# Patient Record
Sex: Male | Born: 1945 | Race: White | Hispanic: No | Marital: Married | Smoking: Never smoker
Health system: Southern US, Community
[De-identification: ages and names within clinical notes are randomized; demographics above are authoritative.]

## PROBLEM LIST (undated history)

## (undated) DIAGNOSIS — E119 Type 2 diabetes mellitus without complications: Secondary | ICD-10-CM

## (undated) DIAGNOSIS — K5792 Diverticulitis of intestine, part unspecified, without perforation or abscess without bleeding: Secondary | ICD-10-CM

## (undated) DIAGNOSIS — E785 Hyperlipidemia, unspecified: Secondary | ICD-10-CM

## (undated) DIAGNOSIS — D689 Coagulation defect, unspecified: Secondary | ICD-10-CM

## (undated) DIAGNOSIS — H269 Unspecified cataract: Secondary | ICD-10-CM

## (undated) DIAGNOSIS — N401 Enlarged prostate with lower urinary tract symptoms: Secondary | ICD-10-CM

## (undated) DIAGNOSIS — R351 Nocturia: Secondary | ICD-10-CM

## (undated) DIAGNOSIS — R001 Bradycardia, unspecified: Secondary | ICD-10-CM

## (undated) DIAGNOSIS — K810 Acute cholecystitis: Secondary | ICD-10-CM

## (undated) DIAGNOSIS — I219 Acute myocardial infarction, unspecified: Secondary | ICD-10-CM

## (undated) DIAGNOSIS — N486 Induration penis plastica: Secondary | ICD-10-CM

## (undated) DIAGNOSIS — I10 Essential (primary) hypertension: Secondary | ICD-10-CM

## (undated) DIAGNOSIS — I6529 Occlusion and stenosis of unspecified carotid artery: Secondary | ICD-10-CM

## (undated) DIAGNOSIS — K219 Gastro-esophageal reflux disease without esophagitis: Secondary | ICD-10-CM

## (undated) DIAGNOSIS — E559 Vitamin D deficiency, unspecified: Secondary | ICD-10-CM

## (undated) DIAGNOSIS — Z5189 Encounter for other specified aftercare: Secondary | ICD-10-CM

## (undated) DIAGNOSIS — D649 Anemia, unspecified: Secondary | ICD-10-CM

## (undated) DIAGNOSIS — S46009A Unspecified injury of muscle(s) and tendon(s) of the rotator cuff of unspecified shoulder, initial encounter: Secondary | ICD-10-CM

## (undated) DIAGNOSIS — B009 Herpesviral infection, unspecified: Secondary | ICD-10-CM

## (undated) DIAGNOSIS — D126 Benign neoplasm of colon, unspecified: Secondary | ICD-10-CM

## (undated) DIAGNOSIS — D696 Thrombocytopenia, unspecified: Secondary | ICD-10-CM

## (undated) DIAGNOSIS — Z86018 Personal history of other benign neoplasm: Secondary | ICD-10-CM

## (undated) DIAGNOSIS — M1711 Unilateral primary osteoarthritis, right knee: Secondary | ICD-10-CM

## (undated) DIAGNOSIS — G2581 Restless legs syndrome: Secondary | ICD-10-CM

## (undated) DIAGNOSIS — C439 Malignant melanoma of skin, unspecified: Secondary | ICD-10-CM

## (undated) DIAGNOSIS — R739 Hyperglycemia, unspecified: Secondary | ICD-10-CM

## (undated) DIAGNOSIS — I2510 Atherosclerotic heart disease of native coronary artery without angina pectoris: Secondary | ICD-10-CM

## (undated) DIAGNOSIS — M7542 Impingement syndrome of left shoulder: Secondary | ICD-10-CM

## (undated) HISTORY — PX: COLONOSCOPY W/ POLYPECTOMY: SHX1380

## (undated) HISTORY — DX: Thrombocytopenia, unspecified: D69.6

## (undated) HISTORY — DX: Essential (primary) hypertension: I10

## (undated) HISTORY — DX: Occlusion and stenosis of unspecified carotid artery: I65.29

## (undated) HISTORY — PX: CHOLECYSTECTOMY: SHX55

## (undated) HISTORY — DX: Acute myocardial infarction, unspecified: I21.9

## (undated) HISTORY — DX: Herpesviral infection, unspecified: B00.9

## (undated) HISTORY — DX: Coagulation defect, unspecified: D68.9

## (undated) HISTORY — DX: Type 2 diabetes mellitus without complications: E11.9

## (undated) HISTORY — DX: Anemia, unspecified: D64.9

## (undated) HISTORY — DX: Atherosclerotic heart disease of native coronary artery without angina pectoris: I25.10

## (undated) HISTORY — DX: Benign neoplasm of colon, unspecified: D12.6

## (undated) HISTORY — DX: Nocturia: R35.1

## (undated) HISTORY — DX: Restless legs syndrome: G25.81

## (undated) HISTORY — DX: Unspecified cataract: H26.9

## (undated) HISTORY — DX: Vitamin D deficiency, unspecified: E55.9

## (undated) HISTORY — DX: Hyperglycemia, unspecified: R73.9

## (undated) HISTORY — DX: Unilateral primary osteoarthritis, right knee: M17.11

## (undated) HISTORY — DX: Benign prostatic hyperplasia with lower urinary tract symptoms: N40.1

## (undated) HISTORY — DX: Gastro-esophageal reflux disease without esophagitis: K21.9

## (undated) HISTORY — PX: VASECTOMY: SHX75

## (undated) HISTORY — DX: Induration penis plastica: N48.6

## (undated) HISTORY — DX: Impingement syndrome of left shoulder: M75.42

## (undated) HISTORY — DX: Encounter for other specified aftercare: Z51.89

## (undated) HISTORY — DX: Malignant melanoma of skin, unspecified: C43.9

## (undated) HISTORY — DX: Unspecified injury of muscle(s) and tendon(s) of the rotator cuff of unspecified shoulder, initial encounter: S46.009A

## (undated) HISTORY — DX: Bradycardia, unspecified: R00.1

## (undated) HISTORY — DX: Hyperlipidemia, unspecified: E78.5

## (undated) HISTORY — PX: CORONARY ANGIOPLASTY: SHX604

## (undated) HISTORY — DX: Acute cholecystitis: K81.0

---
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
## 1999-04-18 HISTORY — PX: CORONARY ARTERY BYPASS GRAFT: SHX141

## 1999-05-08 ENCOUNTER — Inpatient Hospital Stay (HOSPITAL_COMMUNITY): Admission: AD | Admit: 1999-05-08 | Discharge: 1999-05-17 | Payer: Self-pay | Admitting: Cardiology

## 1999-05-10 ENCOUNTER — Encounter: Payer: Self-pay | Admitting: Cardiology

## 1999-05-11 ENCOUNTER — Encounter: Payer: Self-pay | Admitting: Cardiology

## 1999-05-13 ENCOUNTER — Encounter: Payer: Self-pay | Admitting: Cardiothoracic Surgery

## 1999-05-14 ENCOUNTER — Encounter: Payer: Self-pay | Admitting: Cardiothoracic Surgery

## 1999-05-15 ENCOUNTER — Encounter: Payer: Self-pay | Admitting: Cardiothoracic Surgery

## 2001-04-29 ENCOUNTER — Inpatient Hospital Stay (HOSPITAL_COMMUNITY): Admission: EM | Admit: 2001-04-29 | Discharge: 2001-04-30 | Payer: Self-pay | Admitting: Emergency Medicine

## 2002-03-19 ENCOUNTER — Encounter: Payer: Self-pay | Admitting: Emergency Medicine

## 2002-03-19 ENCOUNTER — Emergency Department (HOSPITAL_COMMUNITY): Admission: EM | Admit: 2002-03-19 | Discharge: 2002-03-19 | Payer: Self-pay | Admitting: Emergency Medicine

## 2004-04-08 ENCOUNTER — Ambulatory Visit: Payer: Self-pay

## 2004-04-08 ENCOUNTER — Ambulatory Visit: Payer: Self-pay | Admitting: Cardiology

## 2004-09-22 ENCOUNTER — Ambulatory Visit: Payer: Self-pay | Admitting: Internal Medicine

## 2004-09-24 ENCOUNTER — Ambulatory Visit: Payer: Self-pay | Admitting: Internal Medicine

## 2004-12-05 ENCOUNTER — Ambulatory Visit: Payer: Self-pay | Admitting: Gastroenterology

## 2004-12-16 ENCOUNTER — Encounter (INDEPENDENT_AMBULATORY_CARE_PROVIDER_SITE_OTHER): Payer: Self-pay | Admitting: Specialist

## 2004-12-16 ENCOUNTER — Ambulatory Visit: Payer: Self-pay | Admitting: Gastroenterology

## 2004-12-16 DIAGNOSIS — D126 Benign neoplasm of colon, unspecified: Secondary | ICD-10-CM | POA: Insufficient documentation

## 2004-12-16 HISTORY — DX: Benign neoplasm of colon, unspecified: D12.6

## 2005-06-22 ENCOUNTER — Ambulatory Visit: Payer: Self-pay | Admitting: Cardiology

## 2005-06-26 ENCOUNTER — Ambulatory Visit: Payer: Self-pay | Admitting: Cardiology

## 2005-09-08 ENCOUNTER — Ambulatory Visit: Payer: Self-pay | Admitting: Cardiology

## 2005-10-16 ENCOUNTER — Ambulatory Visit: Payer: Self-pay | Admitting: Cardiology

## 2005-11-24 ENCOUNTER — Ambulatory Visit: Payer: Self-pay | Admitting: Cardiology

## 2006-01-05 ENCOUNTER — Ambulatory Visit: Payer: Self-pay | Admitting: Internal Medicine

## 2006-03-24 ENCOUNTER — Ambulatory Visit: Payer: Self-pay | Admitting: Cardiology

## 2006-06-11 ENCOUNTER — Ambulatory Visit: Payer: Self-pay | Admitting: Internal Medicine

## 2006-07-05 ENCOUNTER — Ambulatory Visit: Payer: Self-pay | Admitting: Cardiology

## 2006-07-07 ENCOUNTER — Ambulatory Visit: Payer: Self-pay | Admitting: Cardiology

## 2006-07-07 LAB — CONVERTED CEMR LAB
ALT: 44 units/L — ABNORMAL HIGH (ref 0–40)
AST: 35 units/L (ref 0–37)
Albumin: 3.9 g/dL (ref 3.5–5.2)
Alkaline Phosphatase: 62 units/L (ref 39–117)
Bilirubin, Direct: 0.1 mg/dL (ref 0.0–0.3)
Cholesterol: 176 mg/dL (ref 0–200)
Direct LDL: 109.4 mg/dL
HDL: 42.3 mg/dL (ref >39.0)
Total Bilirubin: 0.8 mg/dL (ref 0.3–1.2)
Total CHOL/HDL Ratio: 4.2
Total Protein: 6.8 g/dL (ref 6.0–8.3)
Triglycerides: 308 mg/dL (ref 0–149)
VLDL: 62 mg/dL — ABNORMAL HIGH (ref 0–40)

## 2006-09-11 ENCOUNTER — Observation Stay (HOSPITAL_COMMUNITY): Admission: EM | Admit: 2006-09-11 | Discharge: 2006-09-13 | Payer: Self-pay | Admitting: Emergency Medicine

## 2006-09-11 ENCOUNTER — Ambulatory Visit: Payer: Self-pay | Admitting: Cardiology

## 2006-09-17 ENCOUNTER — Ambulatory Visit: Payer: Self-pay

## 2006-09-24 ENCOUNTER — Ambulatory Visit: Payer: Self-pay | Admitting: Cardiology

## 2006-09-30 DIAGNOSIS — Z860100 Personal history of colon polyps, unspecified: Secondary | ICD-10-CM

## 2006-09-30 DIAGNOSIS — E291 Testicular hypofunction: Secondary | ICD-10-CM | POA: Insufficient documentation

## 2006-09-30 DIAGNOSIS — I25810 Atherosclerosis of coronary artery bypass graft(s) without angina pectoris: Secondary | ICD-10-CM

## 2006-09-30 DIAGNOSIS — I10 Essential (primary) hypertension: Secondary | ICD-10-CM

## 2006-09-30 HISTORY — DX: Atherosclerosis of coronary artery bypass graft(s) without angina pectoris: I25.810

## 2006-09-30 HISTORY — DX: Personal history of colonic polyps: Z86.010

## 2006-09-30 HISTORY — DX: Personal history of colon polyps, unspecified: Z86.0100

## 2006-09-30 HISTORY — DX: Essential (primary) hypertension: I10

## 2007-02-22 ENCOUNTER — Ambulatory Visit: Payer: Self-pay | Admitting: Internal Medicine

## 2007-02-22 DIAGNOSIS — K219 Gastro-esophageal reflux disease without esophagitis: Secondary | ICD-10-CM | POA: Insufficient documentation

## 2007-02-22 HISTORY — DX: Gastro-esophageal reflux disease without esophagitis: K21.9

## 2007-03-25 ENCOUNTER — Telehealth (INDEPENDENT_AMBULATORY_CARE_PROVIDER_SITE_OTHER): Payer: Self-pay | Admitting: *Deleted

## 2007-03-31 ENCOUNTER — Ambulatory Visit: Payer: Self-pay | Admitting: Cardiology

## 2007-04-06 ENCOUNTER — Encounter: Payer: Self-pay | Admitting: Internal Medicine

## 2007-06-06 ENCOUNTER — Telehealth (INDEPENDENT_AMBULATORY_CARE_PROVIDER_SITE_OTHER): Payer: Self-pay | Admitting: *Deleted

## 2007-06-10 ENCOUNTER — Ambulatory Visit: Payer: Self-pay | Admitting: Internal Medicine

## 2007-06-10 ENCOUNTER — Encounter (INDEPENDENT_AMBULATORY_CARE_PROVIDER_SITE_OTHER): Payer: Self-pay | Admitting: *Deleted

## 2007-12-23 ENCOUNTER — Ambulatory Visit: Payer: Self-pay

## 2007-12-23 ENCOUNTER — Ambulatory Visit: Payer: Self-pay | Admitting: Cardiology

## 2007-12-23 LAB — CONVERTED CEMR LAB
ALT: 26 units/L (ref 0–53)
AST: 29 units/L (ref 0–37)
Albumin: 4.4 g/dL (ref 3.5–5.2)
Alkaline Phosphatase: 50 units/L (ref 39–117)
Bilirubin, Direct: 0.1 mg/dL (ref 0.0–0.3)
Cholesterol: 174 mg/dL (ref 0–200)
HDL: 45.3 mg/dL (ref >39.0)
LDL Cholesterol: 93 mg/dL (ref 0–99)
PSA: 1.07 ng/mL (ref 0.10–4.00)
Total Bilirubin: 1.5 mg/dL — ABNORMAL HIGH (ref 0.3–1.2)
Total CHOL/HDL Ratio: 3.8
Total Protein: 7.5 g/dL (ref 6.0–8.3)
Triglycerides: 179 mg/dL — ABNORMAL HIGH (ref 0–149)
VLDL: 36 mg/dL (ref 0–40)

## 2008-01-30 ENCOUNTER — Telehealth (INDEPENDENT_AMBULATORY_CARE_PROVIDER_SITE_OTHER): Payer: Self-pay | Admitting: *Deleted

## 2008-02-06 ENCOUNTER — Telehealth (INDEPENDENT_AMBULATORY_CARE_PROVIDER_SITE_OTHER): Payer: Self-pay | Admitting: *Deleted

## 2008-02-06 ENCOUNTER — Ambulatory Visit: Payer: Self-pay | Admitting: Internal Medicine

## 2008-05-28 ENCOUNTER — Telehealth (INDEPENDENT_AMBULATORY_CARE_PROVIDER_SITE_OTHER): Payer: Self-pay | Admitting: *Deleted

## 2008-06-15 ENCOUNTER — Telehealth (INDEPENDENT_AMBULATORY_CARE_PROVIDER_SITE_OTHER): Payer: Self-pay | Admitting: *Deleted

## 2008-08-07 ENCOUNTER — Ambulatory Visit: Payer: Self-pay | Admitting: Internal Medicine

## 2008-08-07 DIAGNOSIS — R7303 Prediabetes: Secondary | ICD-10-CM | POA: Insufficient documentation

## 2008-08-07 DIAGNOSIS — E785 Hyperlipidemia, unspecified: Secondary | ICD-10-CM

## 2008-08-14 ENCOUNTER — Telehealth (INDEPENDENT_AMBULATORY_CARE_PROVIDER_SITE_OTHER): Payer: Self-pay | Admitting: *Deleted

## 2008-09-24 ENCOUNTER — Telehealth (INDEPENDENT_AMBULATORY_CARE_PROVIDER_SITE_OTHER): Payer: Self-pay | Admitting: *Deleted

## 2008-12-05 ENCOUNTER — Encounter (INDEPENDENT_AMBULATORY_CARE_PROVIDER_SITE_OTHER): Payer: Self-pay | Admitting: *Deleted

## 2008-12-10 ENCOUNTER — Ambulatory Visit: Payer: Self-pay | Admitting: Internal Medicine

## 2008-12-10 LAB — CONVERTED CEMR LAB: Hgb A1c MFr Bld: 6.2 % (ref 4.6–6.5)

## 2008-12-11 ENCOUNTER — Encounter (INDEPENDENT_AMBULATORY_CARE_PROVIDER_SITE_OTHER): Payer: Self-pay | Admitting: *Deleted

## 2008-12-16 LAB — CONVERTED CEMR LAB: Vit D, 25-Hydroxy: 25 ng/mL — ABNORMAL LOW (ref 30–89)

## 2008-12-18 ENCOUNTER — Encounter (INDEPENDENT_AMBULATORY_CARE_PROVIDER_SITE_OTHER): Payer: Self-pay | Admitting: *Deleted

## 2009-01-22 ENCOUNTER — Telehealth (INDEPENDENT_AMBULATORY_CARE_PROVIDER_SITE_OTHER): Payer: Self-pay | Admitting: *Deleted

## 2009-01-23 ENCOUNTER — Telehealth: Payer: Self-pay | Admitting: Cardiology

## 2009-02-03 ENCOUNTER — Emergency Department (HOSPITAL_COMMUNITY): Admission: EM | Admit: 2009-02-03 | Discharge: 2009-02-03 | Payer: Self-pay | Admitting: Emergency Medicine

## 2009-03-18 ENCOUNTER — Telehealth (INDEPENDENT_AMBULATORY_CARE_PROVIDER_SITE_OTHER): Payer: Self-pay | Admitting: *Deleted

## 2009-07-26 ENCOUNTER — Ambulatory Visit: Payer: Self-pay | Admitting: Cardiology

## 2009-08-26 ENCOUNTER — Telehealth (INDEPENDENT_AMBULATORY_CARE_PROVIDER_SITE_OTHER): Payer: Self-pay | Admitting: *Deleted

## 2009-09-23 ENCOUNTER — Telehealth (INDEPENDENT_AMBULATORY_CARE_PROVIDER_SITE_OTHER): Payer: Self-pay | Admitting: *Deleted

## 2009-10-21 ENCOUNTER — Telehealth (INDEPENDENT_AMBULATORY_CARE_PROVIDER_SITE_OTHER): Payer: Self-pay | Admitting: *Deleted

## 2009-11-08 ENCOUNTER — Ambulatory Visit: Payer: Self-pay | Admitting: Internal Medicine

## 2009-11-08 ENCOUNTER — Encounter (INDEPENDENT_AMBULATORY_CARE_PROVIDER_SITE_OTHER): Payer: Self-pay | Admitting: *Deleted

## 2009-11-08 DIAGNOSIS — E559 Vitamin D deficiency, unspecified: Secondary | ICD-10-CM | POA: Insufficient documentation

## 2009-11-11 LAB — CONVERTED CEMR LAB
ALT: 30 units/L (ref 0–53)
AST: 38 units/L — ABNORMAL HIGH (ref 0–37)
Albumin: 4.5 g/dL (ref 3.5–5.2)
Alkaline Phosphatase: 49 units/L (ref 39–117)
BUN: 11 mg/dL (ref 6–23)
Basophils Absolute: 0 10*3/uL (ref 0.0–0.1)
Basophils Relative: 0.3 % (ref 0.0–3.0)
Bilirubin, Direct: 0.2 mg/dL (ref 0.0–0.3)
CO2: 29 meq/L (ref 19–32)
Calcium: 9.4 mg/dL (ref 8.4–10.5)
Chloride: 108 meq/L (ref 96–112)
Cholesterol: 172 mg/dL (ref 0–200)
Creatinine, Ser: 1 mg/dL (ref 0.4–1.5)
Direct LDL: 104.6 mg/dL
Eosinophils Absolute: 0.1 10*3/uL (ref 0.0–0.7)
Eosinophils Relative: 1.3 % (ref 0.0–5.0)
GFR calc non Af Amer: 81.44 mL/min (ref >60)
Glucose, Bld: 118 mg/dL — ABNORMAL HIGH (ref 70–99)
HCT: 43.3 % (ref 39.0–52.0)
HDL: 47.2 mg/dL (ref >39.00)
Hemoglobin: 15.1 g/dL (ref 13.0–17.0)
Hgb A1c MFr Bld: 6.3 % (ref 4.6–6.5)
Lymphocytes Relative: 24.3 % (ref 12.0–46.0)
Lymphs Abs: 1.5 10*3/uL (ref 0.7–4.0)
MCHC: 34.8 g/dL (ref 30.0–36.0)
MCV: 92.1 fL (ref 78.0–100.0)
Monocytes Absolute: 0.6 10*3/uL (ref 0.1–1.0)
Monocytes Relative: 9.4 % (ref 3.0–12.0)
Neutro Abs: 3.9 10*3/uL (ref 1.4–7.7)
Neutrophils Relative %: 64.7 % (ref 43.0–77.0)
Platelets: 162 10*3/uL (ref 150.0–400.0)
Potassium: 5.2 meq/L — ABNORMAL HIGH (ref 3.5–5.1)
RBC: 4.71 M/uL (ref 4.22–5.81)
RDW: 13.5 % (ref 11.5–14.6)
Sodium: 144 meq/L (ref 135–145)
TSH: 1.37 microintl units/mL (ref 0.35–5.50)
Testosterone: 224.98 ng/dL — ABNORMAL LOW (ref 350.00–890.00)
Total Bilirubin: 1.1 mg/dL (ref 0.3–1.2)
Total CHOL/HDL Ratio: 4
Total Protein: 7.2 g/dL (ref 6.0–8.3)
Triglycerides: 269 mg/dL — ABNORMAL HIGH (ref 0.0–149.0)
VLDL: 53.8 mg/dL — ABNORMAL HIGH (ref 0.0–40.0)
Vit D, 25-Hydroxy: 46 ng/mL (ref 30–89)
WBC: 6.1 10*3/uL (ref 4.5–10.5)

## 2009-12-10 ENCOUNTER — Encounter (INDEPENDENT_AMBULATORY_CARE_PROVIDER_SITE_OTHER): Payer: Self-pay | Admitting: *Deleted

## 2009-12-12 ENCOUNTER — Ambulatory Visit: Payer: Self-pay | Admitting: Gastroenterology

## 2009-12-24 ENCOUNTER — Telehealth: Payer: Self-pay | Admitting: Gastroenterology

## 2009-12-25 ENCOUNTER — Telehealth: Payer: Self-pay | Admitting: Gastroenterology

## 2009-12-26 ENCOUNTER — Ambulatory Visit: Payer: Self-pay | Admitting: Gastroenterology

## 2010-01-01 ENCOUNTER — Encounter: Payer: Self-pay | Admitting: Gastroenterology

## 2010-01-27 ENCOUNTER — Telehealth (INDEPENDENT_AMBULATORY_CARE_PROVIDER_SITE_OTHER): Payer: Self-pay | Admitting: *Deleted

## 2010-04-09 ENCOUNTER — Ambulatory Visit: Payer: Self-pay | Admitting: Internal Medicine

## 2010-04-09 DIAGNOSIS — K5732 Diverticulitis of large intestine without perforation or abscess without bleeding: Secondary | ICD-10-CM | POA: Insufficient documentation

## 2010-05-18 HISTORY — PX: CORONARY STENT PLACEMENT: SHX1402

## 2010-06-15 LAB — CONVERTED CEMR LAB
ALT: 28 units/L (ref 0–53)
AST: 32 units/L (ref 0–37)
Albumin: 4.1 g/dL (ref 3.5–5.2)
Alkaline Phosphatase: 53 units/L (ref 39–117)
BUN: 13 mg/dL (ref 6–23)
Basophils Absolute: 0 10*3/uL (ref 0.0–0.1)
Basophils Relative: 0.1 % (ref 0.0–3.0)
Bilirubin, Direct: 0 mg/dL (ref 0.0–0.3)
CO2: 29 meq/L (ref 19–32)
Calcium: 9.3 mg/dL (ref 8.4–10.5)
Chloride: 104 meq/L (ref 96–112)
Cholesterol: 187 mg/dL (ref 0–200)
Creatinine, Ser: 1 mg/dL (ref 0.4–1.5)
Eosinophils Absolute: 0 10*3/uL (ref 0.0–0.7)
Eosinophils Relative: 0.8 % (ref 0.0–5.0)
GFR calc non Af Amer: 80.83 mL/min (ref >60)
Glucose, Bld: 107 mg/dL — ABNORMAL HIGH (ref 70–99)
HCT: 44.3 % (ref 39.0–52.0)
HDL: 55.2 mg/dL (ref >39.00)
Hemoglobin: 15.6 g/dL (ref 13.0–17.0)
Hgb A1c MFr Bld: 6.2 % (ref 4.6–6.5)
LDL Cholesterol: 101 mg/dL — ABNORMAL HIGH (ref 0–99)
Lymphocytes Relative: 24.7 % (ref 12.0–46.0)
Lymphs Abs: 1.5 10*3/uL (ref 0.7–4.0)
MCHC: 35.2 g/dL (ref 30.0–36.0)
MCV: 94.1 fL (ref 78.0–100.0)
Monocytes Absolute: 0.6 10*3/uL (ref 0.1–1.0)
Monocytes Relative: 9.2 % (ref 3.0–12.0)
Neutro Abs: 4.1 10*3/uL (ref 1.4–7.7)
Neutrophils Relative %: 65.2 % (ref 43.0–77.0)
Platelets: 141 10*3/uL — ABNORMAL LOW (ref 150.0–400.0)
Potassium: 4.7 meq/L (ref 3.5–5.1)
RBC: 4.71 M/uL (ref 4.22–5.81)
RDW: 12.2 % (ref 11.5–14.6)
Sodium: 141 meq/L (ref 135–145)
TSH: 0.97 microintl units/mL (ref 0.35–5.50)
Total Bilirubin: 1.1 mg/dL (ref 0.3–1.2)
Total CHOL/HDL Ratio: 3
Total Protein: 7.2 g/dL (ref 6.0–8.3)
Triglycerides: 153 mg/dL — ABNORMAL HIGH (ref 0.0–149.0)
VLDL: 30.6 mg/dL (ref 0.0–40.0)
Vit D, 25-Hydroxy: 12 ng/mL — ABNORMAL LOW (ref 30–89)
WBC: 6.2 10*3/uL (ref 4.5–10.5)

## 2010-06-19 NOTE — Assessment & Plan Note (Signed)
Summary: ROV  Medications Added MULTIVITAMINS   TABS (MULTIPLE VITAMIN) 1 by mouth daily ASPIRIN 81 MG  TABS (ASPIRIN) 1 by mouth daily      Allergies Added:   Visit Type:  Follow-up Primary Provider:  Marga Melnick MD  CC:  CAD.  History of Present Illness: The patient presents for her yearly followup although it's been greater than a year. He has known coronary artery disease. Since his last catheterization and stress test he has had no new symptoms. He had one episode about 6 months ago where his blood pressure shot up but this was transient. This has otherwise not been a problem. He has had no further chest pressure, neck or arm discomfort. He has had no palpitations, presyncope or syncope. He denies any PND or orthopnea. He hadn't been exercising until recently and he's now started walking. He's had no problems with this.  Current Medications (verified): 1)  Zetia 10 Mg Tabs (Ezetimibe) .... Take 1 Tablet By Mouth Once A Day 2)  Altace 10 Mg Caps (Ramipril) .... Take 1 Capsule By Mouth Once A Day 3)  Crestor 20 Mg Tabs (Rosuvastatin Calcium) .... Take 1 Tablet By Mouth Once A Day 4)  Aciphex 20 Mg  Tbec (Rabeprazole Sodium) .Marland Kitchen.. 1 By Mouth Qd 5)  Niaspan 1000 Mg  Tbcr (Niacin (Antihyperlipidemic)) .Marland Kitchen.. 1 By Mouth Qd 6)  Metoprolol Tartrate 50 Mg  Tabs (Metoprolol Tartrate) .... 1/2 Tab Bid 7)  Viagra 100 Mg Tabs (Sildenafil Citrate) .... Once Daily As Needed ;do Not Take With Ntg 8)  Nitrostat 0.4 Mg Subl (Nitroglycerin) .Marland Kitchen.. 1 By Mouth As Needed For Chest Pain 9)  Multivitamins   Tabs (Multiple Vitamin) .Marland Kitchen.. 1 By Mouth Daily 10)  Aspirin 81 Mg  Tabs (Aspirin) .Marland Kitchen.. 1 By Mouth Daily  Allergies (verified): 1)  * Tylox  Past History:  Past Medical History: Reviewed history from 07/24/2009 and no changes required. Coronary artery disease(Left anterior descending artery occluded proximally,       diagonal ostial 30-40% stenosis, circumflex and the AV groove 60%       stenosis,  before mid obtuse marginal, large ramus intermediate       occluded at the ostium. The right coronary artery is occluded       proximally. Left internal mammary artery to the left anterior       descending artery was widely patent, saphenous vein graft       sequential to the acute marginal distal right coronary artery had       mild diffuse luminal irregularities. Saphenous vein graft to the       ramus intermediate had 25% stenosis with diffuse luminal       irregularities. Ejection fraction of 55%. The patient had a       negative stress perfusion study to evaluate the circumflex lesion.       The ejection fraction was 66% with no evidence of ischemia or       infarct.)  Hypertension GERD Hyperlipidemia Colonic polyps, hx of  Past Surgical History: Reviewed history from 08/07/2008 and no changes required. Colon polypectomy 2006  tubular adenomas, Dr Russella Dar (due 2011) [facility]o : [facility], Dr Russella Dar Coronary artery bypass graft, 4 vessel 2001 Vasectomy  Review of Systems       As stated in the HPI and negative for all other systems.   Vital Signs:  Patient profile:   65 year old male Height:      68 inches Weight:  209 pounds BMI:     31.89 Pulse rate:   62 / minute Resp:     16 per minute BP sitting:   130 / 86  (right arm)  Vitals Entered By: Marrion Coy, CNA (July 26, 2009 9:21 AM)  Physical Exam  General:  Well developed, well nourished, in no acute distress. Head:  normocephalic and atraumatic Eyes:  PERRLA/EOM intact; conjunctiva and lids normal. Mouth:  Teeth, gums and palate normal. Oral mucosa normal. Neck:  Neck supple, no JVD. No masses, thyromegaly or abnormal cervical nodes. Chest Wall:  well healed surgical scar Lungs:  Clear bilaterally to auscultation and percussion. Heart:  Non-displaced PMI, chest non-tender; regular rate and rhythm, S1, S2 without murmurs, rubs or gallops. Carotid upstroke normal, no bruit. Normal abdominal aortic size, no bruits.  Femorals normal pulses, no bruits. Pedals normal pulses. No edema, no varicosities. Abdomen:  Bowel sounds positive; abdomen soft and non-tender without masses, organomegaly, or hernias noted. No hepatosplenomegaly, obese Msk:  Back normal, normal gait. Muscle strength and tone normal. Extremities:  No clubbing or cyanosis. Neurologic:  Alert and oriented x 3. Skin:  Intact without lesions or rashes. Psych:  Normal affect.   EKG  Procedure date:  07/26/2009  Findings:      sinus rhythm, rate 62, axis within normal limits, intervals within normal limits, low voltage limb leads, no acute ST-T wave changes.  Impression & Recommendations:  Problem # 1:  CORONARY ARTERY DISEASE (ICD-414.00) He has done well in the past year and a half. No further cardiovascular testing is suggested at this time. I emphasized risk reduction. Orders: EKG w/ Interpretation (93000)  Problem # 2:  HYPERTENSION (ICD-401.9) His blood pressure is controlled and he will continue the meds as listed.  Problem # 3:  HYPERLIPIDEMIA (ICD-272.4) I reviewed his lipid profile. His triglycerides were 153, LDL 101 and HDL 55. These are reasonable levels.  Problem # 4:  OBESITY, UNSPECIFIED (ICD-278.00) He understands the need for weight loss with diet and exercise and we reviewed this.  Patient Instructions: 1)  Your physician recommends that you schedule a follow-up appointment in: 12 months with Dr Antoine Poche 2)  Your physician recommends that you continue on your current medications as directed. Please refer to the Current Medication list given to you today.

## 2010-06-19 NOTE — Letter (Signed)
Summary: Kevin Webb  Kevin Webb  [address] [place], [place] [address]   Phone: [phone]  Fax: [phone]       Kevin Webb    1945/08/11    MRN: [id number]        Procedure Day Kevin Webb:  Kevin Webb  12/26/09     Arrival Time:  8:30am     Procedure Time: 9:30am     Location of Procedure:                    Kevin Webb  Kevin Webb (4th Floor)                       PREPARATION FOR COLONOSCOPY WITH MOVIPREP   Starting 5 days prior to your procedure  SATURDAY 08/06  do not eat nuts, seeds, popcorn, corn, beans, peas,  salads, or any raw vegetables.  Do not take any fiber supplements (e.g. Metamucil, Citrucel, and Benefiber).  THE DAY BEFORE YOUR PROCEDURE         DATE:  Kevin Webb  12/25/09  1.  Drink clear liquids the entire day-NO SOLID FOOD  2.  Do not drink anything colored red or purple.  Avoid juices with pulp.  No orange juice.  3.  Drink at least 64 oz. (8 glasses) of fluid/clear liquids during the day to prevent dehydration and help the prep work efficiently.  CLEAR LIQUIDS INCLUDE: Water Jello Ice Popsicles Tea (sugar ok, no milk/cream) Powdered fruit flavored drinks Coffee (sugar ok, no milk/cream) Gatorade Juice: apple, white grape, white cranberry  Lemonade Clear bullion, consomm, broth Carbonated beverages (any kind) Strained chicken noodle soup Hard Candy                             4.  In the morning, mix first dose of MoviPrep solution:    Empty 1 Pouch A and 1 Pouch B into the disposable container    Add lukewarm drinking water to the top line of the container. Mix to dissolve    Refrigerate (mixed solution should be used within 24 hrs)  5.  Begin drinking the prep at 5:00 p.m. The MoviPrep container is divided by 4 marks.   Every 15 minutes drink the solution down to the next mark (approximately 8 oz) until the full liter is complete.   6.  Follow completed prep with 16 oz of clear liquid of your choice  (Nothing red or purple).  Continue to drink clear liquids until bedtime.  7.  Before going to bed, mix second dose of MoviPrep solution:    Empty 1 Pouch A and 1 Pouch B into the disposable container    Add lukewarm drinking water to the top line of the container. Mix to dissolve    Refrigerate  THE DAY OF YOUR PROCEDURE      DATE:   12/26/09  DAY:  THURSDAY  Beginning at  4:30 a.m. (5 hours before procedure):         1. Every 15 minutes, drink the solution down to the next mark (approx 8 oz) until the full liter is complete.  2. Follow completed prep with 16 oz. of clear liquid of your choice.    3. You may drink clear liquids until 7:30am  (2 HOURS BEFORE PROCEDURE).   MEDICATION Webb  Unless otherwise instructed, you should take regular prescription medications with a small sip of water  as early as possible the morning of your procedure.          OTHER Webb  You will need a responsible adult at least 63 years of age to accompany you and drive you home.   This person must remain in the waiting room during your procedure.  Wear loose fitting clothing that is easily removed.  Leave jewelry and other valuables at home.  However, you may wish to bring a book to read or  an iPod/MP3 player to listen to music as you wait for your procedure to start.  Remove all body piercing jewelry and leave at home.  Total time from sign-in until discharge is approximately 2-3 hours.  You should go home directly after your procedure and rest.  You can resume normal activities the  day after your procedure.  The day of your procedure you should not:   Drive   Make legal decisions   Operate machinery   Drink alcohol   Return to work  You will receive specific Webb about eating, activities and medications before you leave.    The above Webb have been reviewed and explained to me by  Kevin Almas RN  December 12, 2009 9:45 AM     I fully  understand and can verbalize these Webb _____________________________ Date _________

## 2010-06-19 NOTE — Progress Notes (Signed)
Summary: Triage   Phone Note Call from Patient Call back at [phone]   Caller: Patient Call For: Dr. Russella Dar Reason for Call: Talk to Nurse Summary of Call: pt. is sch'd for COL tomorrow and wants to know if he can have a ENDO at the same time Initial call taken by: Karna Christmas,  December 25, 2009 4:03 PM  Follow-up for Phone Call        Pt. requesting endo because he says that 6 months ago Dr.Hopper said he needed one to confirm for insurance purposes whether Aciphex is still indicated since he has been on it for several yrs. Follow-up by: Teryl Lucy RN,  December 25, 2009 4:15 PM  Additional Follow-up for Phone Call Additional follow up Details #1::        There are no openings tomorrow. Should have had an office visit to discuss EGD. Can discuss with him tomorrow and schedule at a later date. Additional Follow-up by: Meryl Dare MD Clementeen Graham,  December 25, 2009 4:21 PM    Additional Follow-up for Phone Call Additional follow up Details #2::    Message left on pt's. cell [id number] per Dr.Stark. Follow-up by: Teryl Lucy RN,  December 25, 2009 4:38 PM

## 2010-06-19 NOTE — Progress Notes (Signed)
Summary: Medication   Phone Note Call from Patient   Caller: Patient Call For: Dr. Russella Dar Reason for Call: Talk to Nurse Details for Reason: Colon Prep Summary of Call: Colon prep never called into pharmacy.  NEEDS TODAY. [facility] [place]. Initial call taken by: Schuyler Amor,  December 24, 2009 10:51 AM  Follow-up for Phone Call        Rx was sent to pts pharmacy and pt notified that we sent it again.  Follow-up by: Christie Nottingham CMA Duncan Dull),  December 24, 2009 11:01 AM     Appended Document: Medication    Clinical Lists Changes  Medications: Rx of MOVIPREP 100 GM  SOLR (PEG-KCL-NACL-NASULF-NA ASC-C) As per prep instructions.;  #1 x 0;  Signed;  Entered by: Christie Nottingham CMA (AAMA);  Authorized by: Meryl Dare MD [facility];  Method used: Electronically to [facility] [address], [place], [place], [place]  [id number], Ph: [phone], Fax: [phone]    Prescriptions: MOVIPREP 100 GM  SOLR (PEG-KCL-NACL-NASULF-NA ASC-C) As per prep instructions.  #1 x 0   Entered by:   Christie Nottingham CMA (AAMA)   Authorized by:   Meryl Dare MD [facility]   Signed by:   Christie Nottingham CMA (AAMA) on 12/24/2009   Method used:   Electronically to        [facility] (retail)       [address]       [place], [place]  [id number]       Ph: [phone]       Fax: [phone]   RxID:   [id number]

## 2010-06-19 NOTE — Progress Notes (Signed)
Summary: Refill Request  Phone Note Refill Request Call back at [phone] Message from:  Pharmacy on October 21, 2009 9:57 AM  Refills Requested: Medication #1:  ACIPHEX 20 MG  TBEC **APPOINTMENT DUE FOR ADDITIONAL REFILLS**1 by mouth once daily   Dosage confirmed as above?Dosage Confirmed   Supply Requested: 1 month   Last Refilled: 09/21/2009 [facility]  Next Appointment Scheduled: none Initial call taken by: Harold Barban,  October 21, 2009 9:57 AM    New/Updated Medications: ACIPHEX 20 MG  TBEC (RABEPRAZOLE SODIUM) **APPOINTMENT DUE FOR ADDITIONAL REFILLS** (15day supply given b/c patient is due for APPOINTMENT. 1 by mouth once daily Prescriptions: ACIPHEX 20 MG  TBEC (RABEPRAZOLE SODIUM) **APPOINTMENT DUE FOR ADDITIONAL REFILLS** (15day supply given b/c patient is due for APPOINTMENT. 1 by mouth once daily  #15 x 0   Entered by:   Shonna Chock   Authorized by:   Marga Melnick MD   Signed by:   Shonna Chock on 10/21/2009   Method used:   Electronically to        [facility] (retail)       [address]       [place], [place]  [id number]       Ph: [phone]       Fax: [phone]   RxID:   [id number]

## 2010-06-19 NOTE — Assessment & Plan Note (Signed)
Summary: CPX/KN   Vital Signs:  Patient profile:   65 year old male Height:      69 inches Weight:      211.6 pounds BMI:     31.36 Temp:     97.8 degrees F oral Pulse rate:   60 / minute Resp:     16 per minute BP sitting:   136 / 82  (left arm) Cuff size:   large  Vitals Entered By: Shonna Chock (November 08, 2009 8:44 AM)  Comments REVIEWED MED LIST, PATIENT AGREED DOSE AND INSTRUCTION CORRECT    Primary Care Provider:  Marga Melnick MD   History of Present Illness: Kevin Webb is here for a physical; he is asymptomatic. Hyperlipidemia Follow-Up      This is a 65 year old man who presents for Hyperlipidemia follow-up.  The patient reports flushing, but denies muscle aches, GI upset, abdominal pain, itching, constipation, diarrhea, and fatigue.  Other symptoms include occasional  pedal edema.  The patient denies the following symptoms: chest pain/pressure, exercise intolerance, dypsnea, palpitations, and syncope.  Compliance with medications (by patient report) has been near 100%.  Dietary compliance has been excellent.  Adjunctive measures currently used by the patient include ASA, folic acid, fish oil supplements, and Co-QA.    Hypertension Follow-Up      The patient also presents for Hypertension follow-up.  The patient reports urinary frequency, but denies lightheadedness, headaches, impotence, and rash.  Compliance with medications (by patient report) has been near 100%.  Adjunctive measures currently used by the patient include salt restriction usually.         The patient also  has a PMH of  heartburn.  There is no history of melena, dysphagia, hematemesis, and involuntary weight loss >5%.  Prior evaluation includes upper EGD.  Treatment tried that has been effective include Aciphex ( total control ) .  Treatment tried that was either ineffective or had to be stopped due to problems include diet changes and elevating head of bed.    Preventive Screening-Counseling &  Management  Caffeine-Diet-Exercise     Does Patient Exercise: yes  Allergies: 1)  * Tylox  Past History:  Past Medical History: Coronary artery disease(Left anterior descending artery occluded proximally,       diagonal ostial 30-40% stenosis, circumflex and the AV groove 60%       stenosis, before mid obtuse marginal, large ramus intermediate       occluded at the ostium. Right coronary artery was  occluded       proximally. Left internal mammary artery to the left anterior       descending artery was widely patent, saphenous vein graft       sequential to the acute marginal distal right coronary artery had       mild diffuse luminal irregularities. Saphenous vein graft to the       ramus intermediate had 25% stenosis with diffuse luminal       irregularities. Ejection fraction of 55%. The patient had a       negative stress perfusion study to evaluate the circumflex lesion.       The ejection fraction was 66% with no evidence of ischemia or       infarct.) ,Kevin Webb, Cardiologist Hypertension GERD Hyperlipidemia; Fasting Hyperglycemia(790.29); Vitamin D deficiency(268.9) Colonic polyps, hx of Peyronie's disease; Low testosterone , Kevin Webb, Urologist, [facility]  Past Surgical History: Colon polypectomy 2006 : tubular adenomas, Kevin Russella Dar (due 2011) Endoscopy  : [facility]  2000, Kevin Russella Dar Coronary artery bypass graft, 4 vessel 2001 Vasectomy  Family History: Father: MI @ 79,AAA Mother: MI @ 62,CBAG Siblings: bro: DM ,diet /exercise controlled; DM in Paternal FH ( G aunts & uncles)  Social History: Occupation:Business Owner Single Never Smoked Alcohol use-yes, 4oz/night on average  Regular exercise-yes: resistance weights 2X/week; walks 1 mpd 3X/week Does Patient Exercise:  yes  Review of Systems General:  Complains of sleep disorder; OTC antihistamine helps sleep. Eyes:  Denies blurring, double vision, and vision loss-both eyes. CV:  Kevin Webb's 07/2009 OV  reviewed.Marland Kitchen Resp:  Denies cough, excessive snoring, hypersomnolence, morning headaches, and sputum productive. GU:  Denies decreased libido, discharge, dysuria, and hematuria; See PMH ; he asaw Kevin Webb 06/2009. He used testosterone X 30 days.. MS:  Complains of low back pain, mid back pain, and thoracic pain; denies muscle weakness; Chiropractry helps back pains. Derm:  Denies changes in nail beds, dryness, hair loss, lesion(s), and rash; Neg Derm exam 07/2009. Neuro:  Denies numbness and tingling. Psych:  Denies anxiety and depression. Endo:  Denies cold intolerance, excessive hunger, excessive thirst, and heat intolerance. Heme:  Denies abnormal bruising and bleeding. Allergy:  Denies itching eyes and sneezing; No angioedema.  Physical Exam  General:  well-nourished; alert,appropriate and cooperative throughout examination Head:  Normocephalic and atraumatic without obvious abnormalities. No apparent alopecia  Eyes:  No corneal or conjunctival inflammation noted.Perrla. Funduscopic exam benign, without hemorrhages, exudates or papilledema.  Ears:  External ear exam shows no significant lesions or deformities.  Otoscopic examination reveals clear canals, tympanic membranes are intact bilaterally without bulging, retraction, inflammation or discharge. Hearing is grossly normal bilaterally. Nose:  External nasal examination shows no deformity or inflammation. Nasal mucosa are pink and moist without lesions or exudates. Mouth:  Oral mucosa and oropharynx without lesions or exudates.  Teeth in good repair. No pharyngeal erythema.   Neck:  No deformities, masses, or tenderness noted. Lungs:  Normal respiratory effort, chest expands symmetrically. Lungs are clear to auscultation, no crackles or wheezes. Heart:  Normal rate and regular rhythm. S1 and S2 normal without gallop, murmur, click, rub .S4 wuth slurring Abdomen:  Bowel sounds positive,abdomen soft and non-tender without masses, organomegaly  or hernias noted. Genitalia:  Kevin Webb Msk:  No deformity or scoliosis noted of thoracic or lumbar spine.   Pulses:  R and L carotid,radial,dorsalis pedis and posterior tibial pulses are full and equal bilaterally Extremities:  No clubbing, cyanosis, edema, or deformity noted with normal full range of motion of all joints.   crepitus. Good nail health Neurologic:  alert & oriented X3, sensation intact to light touch over feet , and DTRs symmetrical and normal.   Skin:  Cluster of cherry angiomata R abdomen Cervical Nodes:  No lymphadenopathy noted Axillary Nodes:  No palpable lymphadenopathy Psych:  memory intact for recent and remote, normally interactive, and good eye contact.     Impression & Recommendations:  Problem # 1:  ROUTINE GENERAL MEDICAL EXAM@HEALTH  CARE FACL (ICD-V70.0)  Orders: Venipuncture (81191) TLB-Lipid Panel (80061-LIPID) TLB-BMP (Basic Metabolic Panel-BMET) (80048-METABOL) TLB-CBC Platelet - w/Differential (85025-CBCD) TLB-Hepatic/Liver Function Pnl (80076-HEPATIC) TLB-TSH (Thyroid Stimulating Hormone) (84443-TSH) TLB-A1C / Hgb A1C (Glycohemoglobin) (83036-A1C) Gastroenterology Referral (GI) T-Vitamin D (25-Hydroxy) (47829-56213) TLB-Testosterone, Total (84403-TESTO)  Problem # 2:  HYPERLIPIDEMIA (ICD-272.4)  His updated medication list for this problem includes:    Zetia 10 Mg Tabs (Ezetimibe) .Marland Kitchen... Take 1 tablet by mouth once a day    Crestor 20 Mg Tabs (Rosuvastatin calcium) .Marland Kitchen... Take  1 tablet by mouth once a day    Niaspan 1000 Mg Tbcr (Niacin (antihyperlipidemic)) .Marland Kitchen... 1 by mouth qd  Orders: Venipuncture (16109) TLB-Lipid Panel (80061-LIPID)  Problem # 3:  HYPERTENSION (ICD-401.9)  controlled His updated medication list for this problem includes:    Altace 10 Mg Caps (Ramipril) .Marland Kitchen... Take 1 capsule by mouth once a day    Metoprolol Tartrate 50 Mg Tabs (Metoprolol tartrate) .Marland Kitchen... 1/2 tab bid  Orders: Venipuncture (60454)  Problem # 4:  GERD  (ICD-530.81) controlled His updated medication list for this problem includes:    Aciphex 20 Mg Tbec (Rabeprazole sodium) .Marland Kitchen... 1 by mouth once daily  Problem # 5:  VITAMIN D DEFICIENCY (ICD-268.9)  Orders: Venipuncture (09811) T-Vitamin D (25-Hydroxy) (91478-29562)  Problem # 6:  FASTING HYPERGLYCEMIA (ICD-790.29)  Orders: Venipuncture (13086) TLB-A1C / Hgb A1C (Glycohemoglobin) (83036-A1C)  Problem # 7:  COLONIC POLYPS, HX OF (ICD-V12.72)  Orders: Venipuncture (57846) TLB-CBC Platelet - w/Differential (85025-CBCD) Gastroenterology Referral (GI)  Problem # 8:  CORONARY ARTERY DISEASE (ICD-414.00) as per Kevin Antoine Poche His updated medication list for this problem includes:    Altace 10 Mg Caps (Ramipril) .Marland Kitchen... Take 1 capsule by mouth once a day    Metoprolol Tartrate 50 Mg Tabs (Metoprolol tartrate) .Marland Kitchen... 1/2 tab bid    Nitrostat 0.4 Mg Subl (Nitroglycerin) .Marland Kitchen... 1 by mouth as needed for chest pain    Aspirin 81 Mg Tabs (Aspirin) .Marland Kitchen... 1 by mouth daily  Problem # 9:  TESTOSTERONE DEFICIENCY (ICD-257.2)  Orders: TLB-Testosterone, Total (84403-TESTO)  Complete Medication List: 1)  Zetia 10 Mg Tabs (Ezetimibe) .... Take 1 tablet by mouth once a day 2)  Altace 10 Mg Caps (Ramipril) .... Take 1 capsule by mouth once a day 3)  Crestor 20 Mg Tabs (Rosuvastatin calcium) .... Take 1 tablet by mouth once a day 4)  Aciphex 20 Mg Tbec (Rabeprazole sodium) .Marland Kitchen.. 1 by mouth once daily 5)  Niaspan 1000 Mg Tbcr (Niacin (antihyperlipidemic)) .Marland Kitchen.. 1 by mouth qd 6)  Metoprolol Tartrate 50 Mg Tabs (Metoprolol tartrate) .... 1/2 tab bid 7)  Viagra 100 Mg Tabs (Sildenafil citrate) .... Once daily as needed ;do not take with ntg 8)  Nitrostat 0.4 Mg Subl (Nitroglycerin) .Marland Kitchen.. 1 by mouth as needed for chest pain 9)  Multivitamins Tabs (Multiple vitamin) .Marland Kitchen.. 1 by mouth daily 10)  Aspirin 81 Mg Tabs (Aspirin) .Marland Kitchen.. 1 by mouth daily  Patient Instructions: 1)  It is important that you exercise  regularly at least 20 minutes 5 times a week. If you develop chest pain, have severe difficulty breathing, or feel very tired , stop exercising immediately and seek medical attention. 2)  Take an  81 mg coated Aspirin every day with b'fast. 3)  Check your Blood Pressure regularly. If it is above:135/85 ON AVERAGE  you should make an appointment. 4)  Avoid foods high in acid (tomatoes, citrus juices, spicy foods). Avoid eating within two hours of lying down or before exercising. Do not over eat; try smaller more frequent meals. Elevate head of bed twelve inches when sleeping. Prescriptions: ACIPHEX 20 MG  TBEC (RABEPRAZOLE SODIUM) 1 by mouth once daily  #90 x 3   Entered and Authorized by:   Marga Melnick MD   Signed by:   Marga Melnick MD on 11/08/2009   Method used:   Print then Give to Patient   RxID:   [id number] METOPROLOL TARTRATE 50 MG  TABS (METOPROLOL TARTRATE) 1/2 tab bid  #90 x 3  Entered and Authorized by:   Marga Melnick MD   Signed by:   Marga Melnick MD on 11/08/2009   Method used:   Print then Give to Patient   RxID:   [id number] CRESTOR 20 MG TABS (ROSUVASTATIN CALCIUM) Take 1 tablet by mouth once a day  #90 x 3   Entered and Authorized by:   Marga Melnick MD   Signed by:   Marga Melnick MD on 11/08/2009   Method used:   Print then Give to Patient   RxID:   [id number] ZETIA 10 MG TABS (EZETIMIBE) Take 1 tablet by mouth once a day  #90 x 3   Entered and Authorized by:   Marga Melnick MD   Signed by:   Marga Melnick MD on 11/08/2009   Method used:   Print then Give to Patient   RxID:   [id number] ALTACE 10 MG CAPS (RAMIPRIL) Take 1 capsule by mouth once a day  #90 x 3   Entered and Authorized by:   Marga Melnick MD   Signed by:   Marga Melnick MD on 11/08/2009   Method used:   Print then Give to Patient   RxID:   [id number]

## 2010-06-19 NOTE — Miscellaneous (Signed)
Summary: lec pREVISIT/PREP  Clinical Lists Changes  Medications: Added new medication of MOVIPREP 100 GM  SOLR (PEG-KCL-NACL-NASULF-NA ASC-C) As per prep instructions. - Signed Rx of MOVIPREP 100 GM  SOLR (PEG-KCL-NACL-NASULF-NA ASC-C) As per prep instructions.;  #1 x 0;  Signed;  Entered by: [name] RN;  Authorized by: Meryl Dare MD [facility];  Method used: Electronically to [facility] [address] [place], [place], [place]  [id number], Ph: [phone], Fax: [phone] Allergies: Changed allergy or adverse reaction from * TYLOX to * TYLOX    Prescriptions: MOVIPREP 100 GM  SOLR (PEG-KCL-NACL-NASULF-NA ASC-C) As per prep instructions.  #1 x 0   Entered by:   [name] RN   Authorized by:   Meryl Dare MD [facility]   Signed by:   [name] RN on 12/12/2009   Method used:   Electronically to        [facility] (retail)       [address]       [place], [place]  [id number]       Ph: [phone]       Fax: [phone]   RxID:   [id number]

## 2010-06-19 NOTE — Letter (Signed)
Summary: Patient Notice- Polyp Results  [facility]  [address] [place], [place] [address]   Phone: [phone]  Fax: [phone]        January 01, 2010 MRN: [id number]    JOCK MAHON [address] [place]  [address]    Dear Mr. Hogrefe,  I am pleased to inform you that the colon polyp(s) removed during your recent colonoscopy was (were) found to be benign (no cancer detected) upon pathologic examination.  I recommend you have a repeat colonoscopy examination in 5 years to look for recurrent polyps, as having colon polyps increases your risk for having recurrent polyps or even colon cancer in the future.  Should you develop new or worsening symptoms of abdominal pain, bowel habit changes or bleeding from the rectum or bowels, please schedule an evaluation with either your primary care physician or with me.  Continue treatment plan as outlined the day of your exam.  Please call us if you are having persistent problems or have questions about your condition that have not been fully answered at this time.  Sincerely,  Meryl Dare MD [facility]  This letter has been electronically signed by your physician.  Appended Document: Patient Notice- Polyp Results letter mailed

## 2010-06-19 NOTE — Progress Notes (Signed)
Summary: VIT D REFILL  Phone Note Refill Request Message from:  Fax from Pharmacy on January 27, 2010 10:59 AM  REFILL REQUEST ---VITAMIN D 50000---REMOVED FROM PT LIST 07/26/09---[facility], [address], [place], [place]   [facility] [place] [phone]  Initial call taken by: Jerolyn Shin,  January 27, 2010 11:09 AM  Follow-up for Phone Call        Left message with Noreene Larsson to have patient return call when avaliable, reason for call:   Ask patient if he is still taking med Follow-up by: Shonna Chock CMA,  January 27, 2010 11:19 AM  Additional Follow-up for Phone Call Additional follow up Details #1::        PATIENT SAID HIS SISTER, WHO HANDLES HIS REFILLS, TOOK THIS REQUEST IN---SAID HE IS TAKING OTC VIT D (DIDNT KNOW STRENGTH)  HE SAID TO FORGET THIS REFILL REQUEST UNLESS DR HOPPER FEELS THAT HE NEEDS IT---WAS SEEN FOR HIS CPX IN JUNE 2011 Additional Follow-up by: Jerolyn Shin,  January 27, 2010 11:30 AM    Additional Follow-up for Phone Call Additional follow up Details #2::    Noted./Chrae [facility] CMA  January 27, 2010 1:12 PM    I called the pharmacy and informed them rx was given to them in error and patient is no longer taking prescribed Vit D./Chrae [facility] CMA  January 27, 2010 1:14 PM

## 2010-06-19 NOTE — Procedures (Signed)
Summary: Colonoscopy  Patient: Kevin Webb Note: All result statuses are Final unless otherwise noted.  Tests: (1) Colonoscopy (COL)   COL Colonoscopy           DONE     [facility]     [address]     [place], [place]  [address]           COLONOSCOPY PROCEDURE REPORT           PATIENT:  [name], [name]  MR#:  [id number]     BIRTHDATE:  [birth year], 64 yrs. old  GENDER:  male     ENDOSCOPIST:  Judie Petit T. Russella Dar, MD, [facility]           PROCEDURE DATE:  12/26/2009     PROCEDURE:  Colonoscopy with snare polypectomy     ASA CLASS:  Class II     INDICATIONS:  1) surveillance and high-risk screening  2)     follow-up of polyp, adenomatous polyp, 12/2004.     MEDICATIONS:   Fentanyl 75 mcg IV, Versed 6 mg IV, Benadryl 25 mg     IV     DESCRIPTION OF PROCEDURE:   After the risks benefits and     alternatives of the procedure were thoroughly explained, informed     consent was obtained.  Digital rectal exam was performed and     revealed no abnormalities.   The LB [id number] endoscope     was introduced through the anus and advanced to the cecum, which     was identified by both the appendix and ileocecal valve, without     limitations.  The quality of the prep was excellent, using     MoviPrep.  The instrument was then slowly withdrawn as the colon     was fully examined.     <<PROCEDUREIMAGES>>     FINDINGS:  Moderate diverticulosis was found in the sigmoid colon.     A sessile polyp was found in the mid transverse colon. It was 5 mm     in size. Polyp was snared without cautery. Retrieval was     successful. Two polyps were found in the descending colon. They     were 4 mm in size. Polyps were snared without cautery. Retrieval     was successful. A sessile polyp was found in the sigmoid colon. It     was 6 mm in size. Polyp was snared without cautery. Retrieval was     successful.This was otherwise a normal examination of the colon.     Retroflexed views in the rectum  revealed no abnormalities.    The     time to cecum =  1.25  minutes. The scope was then withdrawn (time     =  9.25  min) from the patient and the procedure completed.           COMPLICATIONS:  None           ENDOSCOPIC IMPRESSION:     1) Moderate diverticulosis in the sigmoid colon     2) 5 mm sessile polyp in the mid transverse colon     3) 4 mm Two polyps in the descending colon     4) 6 mm sessile polyp in the sigmoid colon           RECOMMENDATIONS:     1) Await pathology results     2) High fiber diet with liberal fluid intake.     3) Repeat  Colonoscopy in 5 years pending pathology review           Malcolm T. Russella Dar, MD, [facility]           CC: Pecola Lawless, MD           n.     Rosalie DoctorVenita Lick. Stark at 12/26/2009 10:03 AM           [name], [id number]  Note: An exclamation mark (!) indicates a result that was not dispersed into the flowsheet. Document Creation Date: 12/26/2009 10:03 AM _______________________________________________________________________  (1) Order result status: Final Collection or observation date-time: 12/26/2009 09:47 Requested date-time:  Receipt date-time:  Reported date-time:  Referring Physician:   Ordering Physician: Claudette Head [phone]) Specimen Source:  Source: Launa Grill Order Number: [id number] Lab site:   Appended Document: Colonoscopy

## 2010-06-19 NOTE — Assessment & Plan Note (Signed)
Summary: FOR STOMACH PROBLEM//PH   Vital Signs:  Patient profile:   65 year old male Weight:      209.8 pounds BMI:     31.09 Temp:     98.5 degrees F oral Pulse rate:   60 / minute Resp:     16 per minute BP sitting:   138 / 90  (left arm) Cuff size:   large  Vitals Entered By: Shonna Chock CMA (April 09, 2010 11:35 AM) CC: Stomach Concerns , Abdominal pain   Primary Care Provider:  Marga Melnick MD  CC:  Stomach Concerns  and Abdominal pain.  History of Present Illness: Abdominal Pain      This is a 65 year old man who presents with Abdominal pain  since  11/19  in suprapubic  area after eating popcocorn.  He had fever 11/19 - 21 with chills & sweats. Pain was up to 7-8. The patient denies nausea, vomiting, diarrhea ( stools were  loose), constipation, melena, hematochezia, anorexia, and hematemesis.  The location of the pain is left  lower quadrant and suprapubic area.  The pain is described as intermittent, sharp, and cramping in quality.  He actually had   dysuria  11/17; it was treated with Cipro X 5 days from Dr Evelene Croon , Urologist, with  relief .  The patient denies the following symptoms: chest pain, jaundice, and dark urine.  The pain is worse with jarring of the abdomen.  The pain was  worse  with defecation 11/19 -21.  PMH of diverticulitis 2003. Colonoscopy by Dr Russella Dar  6 months ago  revealed diverticulosis. Pain is now a "one" after 5 days of Cipro  Current Medications (verified): 1)  Zetia 10 Mg Tabs (Ezetimibe) .... Take 1 Tablet By Mouth Once A Day 2)  Altace 10 Mg Caps (Ramipril) .... Take 1 Capsule By Mouth Once A Day 3)  Crestor 20 Mg Tabs (Rosuvastatin Calcium) .... Take 1 Tablet By Mouth Once A Day 4)  Aciphex 20 Mg  Tbec (Rabeprazole Sodium) .Marland Kitchen.. 1 By Mouth Once Daily 5)  Niaspan 1000 Mg  Tbcr (Niacin (Antihyperlipidemic)) .Marland Kitchen.. 1 By Mouth Qd 6)  Metoprolol Tartrate 50 Mg  Tabs (Metoprolol Tartrate) .... 1/2 Tab Bid 7)  Viagra 100 Mg Tabs (Sildenafil Citrate)  .... Once Daily As Needed ;do Not Take With Ntg 8)  Nitrostat 0.4 Mg Subl (Nitroglycerin) .Marland Kitchen.. 1 By Mouth As Needed For Chest Pain 9)  Multivitamins   Tabs (Multiple Vitamin) .Marland Kitchen.. 1 By Mouth Daily 10)  Aspirin 81 Mg  Tabs (Aspirin) .Marland Kitchen.. 1 By Mouth Daily 11)  Fish Oil 1000 Mg Caps (Omega-3 Fatty Acids) .Marland Kitchen.. 1 By Mouth Once Daily 12)  Vitamin D3 1000 Unit Tabs (Cholecalciferol) .Marland Kitchen.. 1 By Mouth Once Daily 13)  Vitamin E 100 Unit Caps (Vitamin E) .Marland Kitchen.. 1 By Mouth Once Daily 14)  Vitamin C 1000 Mg Tabs (Ascorbic Acid) .Marland Kitchen.. 1 By Mouth Once Daily  Allergies: 1)  * Tylox  Physical Exam  General:  well-nourished,in no acute distress; alert,appropriate and cooperative throughout examination Eyes:  No corneal or conjunctival inflammation noted.No icterus Mouth:  Oral mucosa and oropharynx without lesions or exudates.  No pharyngeal erythema.   Lungs:  Normal respiratory effort, chest expands symmetrically. Lungs are clear to auscultation, no crackles or wheezes. Heart:  Normal rate and regular rhythm. S1 and S2 normal without gallop, murmur, click, rub or other extra sounds. Abdomen:  Bowel sounds positive,abdomen soft and non-tender without masses, organomegaly or hernias  noted. Msk:  No deformity or scoliosis noted of thoracic or lumbar spine.   No flank tenderness. Neg SLR  Skin:  Intact without suspicious lesions or rashes. No jaundice Cervical Nodes:  No lymphadenopathy noted Axillary Nodes:  No palpable lymphadenopathy Psych:  memory intact for recent and remote, normally interactive, and good eye contact.     Impression & Recommendations:  Problem # 1:  DIVERTICULITIS, ACUTE (ICD-562.11) resolving post 5 days Cipro  Complete Medication List: 1)  Zetia 10 Mg Tabs (Ezetimibe) .... Take 1 tablet by mouth once a day 2)  Altace 10 Mg Caps (Ramipril) .... Take 1 capsule by mouth once a day 3)  Crestor 20 Mg Tabs (Rosuvastatin calcium) .... Take 1 tablet by mouth once a day 4)  Aciphex 20 Mg  Tbec (Rabeprazole sodium) .Marland Kitchen.. 1 by mouth once daily 5)  Niaspan 1000 Mg Tbcr (Niacin (antihyperlipidemic)) .Marland Kitchen.. 1 by mouth qd 6)  Metoprolol Tartrate 50 Mg Tabs (Metoprolol tartrate) .... 1/2 tab bid 7)  Viagra 100 Mg Tabs (Sildenafil citrate) .... Once daily as needed ;do not take with ntg 8)  Nitrostat 0.4 Mg Subl (Nitroglycerin) .Marland Kitchen.. 1 by mouth as needed for chest pain 9)  Multivitamins Tabs (Multiple vitamin) .Marland Kitchen.. 1 by mouth daily 10)  Aspirin 81 Mg Tabs (Aspirin) .Marland Kitchen.. 1 by mouth daily 11)  Fish Oil 1000 Mg Caps (Omega-3 fatty acids) .Marland Kitchen.. 1 by mouth once daily 12)  Vitamin D3 1000 Unit Tabs (Cholecalciferol) .Marland Kitchen.. 1 by mouth once daily 13)  Vitamin E 100 Unit Caps (Vitamin e) .Marland Kitchen.. 1 by mouth once daily 14)  Vitamin C 1000 Mg Tabs (Ascorbic acid) .Marland Kitchen.. 1 by mouth once daily 15)  Metronidazole 500 Mg Tabs (Metronidazole) .Marland Kitchen.. 1 three times a day ; avoid aalcohol  Patient Instructions: 1)  Stick with a low residue diet and avoid foods that can irritate your bowels. Continue Cipro for 5 days. Prescriptions: METRONIDAZOLE 500 MG TABS (METRONIDAZOLE) 1 three times a day ; avoid aalcohol  #21 x 0   Entered and Authorized by:   Marga Melnick MD   Signed by:   Marga Melnick MD on 04/09/2010   Method used:   Faxed to ...       [facility] (retail)       [address]       [place], [place]  [id number]       Ph: [phone]       Fax: [phone]   RxID:   [id number]    Orders Added: 1)  Est. Patient Level IV [84166]  Appended Document: FOR STOMACH PROBLEM//PH His father had AAA; he questions his risk. No bruits or AAA on exam. BP goal = < 135/85 on average.

## 2010-06-19 NOTE — Letter (Signed)
Summary: Colonoscopy Letter  [facility]  [address] [place], [place] [address]   Phone: [phone]  Fax: [phone]      November 08, 2009 MRN: [id number]   Kevin Webb [address] [place] [address] [place]  [address]   Dear [name],   According to your medical record, it is time for you to schedule a Colonoscopy. The American Cancer Society recommends this procedure as a method to detect early colon cancer. Patients with a family history of colon cancer, or a personal history of colon polyps or inflammatory bowel disease are at increased risk.  This letter has beeen generated based on the recommendations made at the time of your procedure. If you feel that in your particular situation this may no longer apply, please contact our office.  Please call our office at [phone] to schedule this appointment or to update your records at your earliest convenience.  Thank you for cooperating with [place] to provide you with the very best care possible.   Sincerely,  Judie Petit T. Russella Dar, M.D.  [facility] [phone]

## 2010-06-19 NOTE — Progress Notes (Signed)
Summary: Refill Request  Phone Note Refill Request Call back at [phone] Message from:  Pharmacy on Sep 23, 2009 8:49 AM  Refills Requested: Medication #1:  ACIPHEX 20 MG  TBEC 1 by mouth once daily**APPOINTMENT DUE**   Dosage confirmed as above?Dosage Confirmed   Supply Requested: 1 month   Last Refilled: 08/24/2009 [facility]  Next Appointment Scheduled: none Initial call taken by: Harold Barban,  Sep 23, 2009 8:49 AM    New/Updated Medications: ACIPHEX 20 MG  TBEC (RABEPRAZOLE SODIUM) **APPOINTMENT DUE FOR ADDITIONAL REFILLS**1 by mouth once daily Prescriptions: ACIPHEX 20 MG  TBEC (RABEPRAZOLE SODIUM) **APPOINTMENT DUE FOR ADDITIONAL REFILLS**1 by mouth once daily  #30 x 0   Entered by:   Shonna Chock   Authorized by:   Marga Melnick MD   Signed by:   Shonna Chock on 09/23/2009   Method used:   Electronically to        [facility] (retail)       [address]       [place], [place]  [id number]       Ph: [phone]       Fax: [phone]   RxID:   [id number]

## 2010-06-19 NOTE — Progress Notes (Signed)
Summary: Refill Request  Phone Note Refill Request Message from:  Pharmacy on [facility] Fax #: [phone]  Refills Requested: Medication #1:  ACIPHEX 20 MG  TBEC 1 by mouth qd   Dosage confirmed as above?Dosage Confirmed   Supply Requested: 1 month   Last Refilled: 07/29/2009 Next Appointment Scheduled: none Initial call taken by: Harold Barban,  August 26, 2009 10:57 AM    New/Updated Medications: ACIPHEX 20 MG  TBEC (RABEPRAZOLE SODIUM) 1 by mouth once daily**APPOINTMENT DUE** Prescriptions: ACIPHEX 20 MG  TBEC (RABEPRAZOLE SODIUM) 1 by mouth once daily**APPOINTMENT DUE**  #30 x 0   Entered by:   Shonna Chock   Authorized by:   Marga Melnick MD   Signed by:   Shonna Chock on 08/26/2009   Method used:   Electronically to        [facility] (retail)       [address]       [place], [place]  [id number]       Ph: [phone]       Fax: [phone]   RxID:   [id number]

## 2010-07-17 ENCOUNTER — Telehealth: Payer: Self-pay | Admitting: Cardiology

## 2010-07-18 ENCOUNTER — Observation Stay (HOSPITAL_COMMUNITY)
Admission: EM | Admit: 2010-07-18 | Discharge: 2010-07-20 | DRG: 852 | Disposition: A | Discharge status: Home / Self Care | Payer: BC Managed Care – PPO | Attending: Internal Medicine | Admitting: Internal Medicine

## 2010-07-18 ENCOUNTER — Encounter: Payer: Self-pay | Admitting: Internal Medicine

## 2010-07-18 ENCOUNTER — Emergency Department (HOSPITAL_COMMUNITY): Payer: BC Managed Care – PPO

## 2010-07-18 DIAGNOSIS — I498 Other specified cardiac arrhythmias: Secondary | ICD-10-CM | POA: Insufficient documentation

## 2010-07-18 DIAGNOSIS — I25810 Atherosclerosis of coronary artery bypass graft(s) without angina pectoris: Secondary | ICD-10-CM

## 2010-07-18 DIAGNOSIS — Z8249 Family history of ischemic heart disease and other diseases of the circulatory system: Secondary | ICD-10-CM | POA: Insufficient documentation

## 2010-07-18 DIAGNOSIS — I2582 Chronic total occlusion of coronary artery: Secondary | ICD-10-CM | POA: Insufficient documentation

## 2010-07-18 DIAGNOSIS — Z01810 Encounter for preprocedural cardiovascular examination: Secondary | ICD-10-CM | POA: Insufficient documentation

## 2010-07-18 DIAGNOSIS — I2510 Atherosclerotic heart disease of native coronary artery without angina pectoris: Secondary | ICD-10-CM

## 2010-07-18 DIAGNOSIS — I10 Essential (primary) hypertension: Secondary | ICD-10-CM | POA: Insufficient documentation

## 2010-07-18 DIAGNOSIS — R079 Chest pain, unspecified: Secondary | ICD-10-CM

## 2010-07-18 DIAGNOSIS — E785 Hyperlipidemia, unspecified: Secondary | ICD-10-CM | POA: Insufficient documentation

## 2010-07-18 DIAGNOSIS — I200 Unstable angina: Secondary | ICD-10-CM | POA: Insufficient documentation

## 2010-07-18 DIAGNOSIS — Z01818 Encounter for other preprocedural examination: Secondary | ICD-10-CM | POA: Insufficient documentation

## 2010-07-18 DIAGNOSIS — Z01812 Encounter for preprocedural laboratory examination: Secondary | ICD-10-CM | POA: Insufficient documentation

## 2010-07-18 LAB — URINALYSIS, ROUTINE W REFLEX MICROSCOPIC
Bilirubin Urine: NEGATIVE
Leukocytes, UA: NEGATIVE
Nitrite: NEGATIVE
Specific Gravity, Urine: 1.013 (ref 1.005–1.030)
Urobilinogen, UA: 0.2 mg/dL (ref 0.0–1.0)
pH: 6 (ref 5.0–8.0)

## 2010-07-18 LAB — CBC
HCT: 45.9 % (ref 39.0–52.0)
Hemoglobin: 16.1 g/dL (ref 13.0–17.0)
MCH: 31.8 pg (ref 26.0–34.0)
MCV: 90.5 fL (ref 78.0–100.0)
Platelets: 172 10*3/uL (ref 150–400)
RBC: 5.07 MIL/uL (ref 4.22–5.81)
WBC: 8.7 10*3/uL (ref 4.0–10.5)

## 2010-07-18 LAB — BASIC METABOLIC PANEL
BUN: 12 mg/dL (ref 6–23)
Chloride: 104 mEq/L (ref 96–112)
Creatinine, Ser: 0.95 mg/dL (ref 0.4–1.5)
GFR calc non Af Amer: >60 mL/min (ref >60)
Glucose, Bld: 111 mg/dL — ABNORMAL HIGH (ref 70–99)
Potassium: 4.5 mEq/L (ref 3.5–5.1)

## 2010-07-18 LAB — DIFFERENTIAL
Basophils Absolute: 0 10*3/uL (ref 0.0–0.1)
Basophils Relative: 0 % (ref 0–1)
Eosinophils Absolute: 0 10*3/uL (ref 0.0–0.7)
Eosinophils Relative: 1 % (ref 0–5)
Monocytes Absolute: 0.6 10*3/uL (ref 0.1–1.0)
Monocytes Relative: 7 % (ref 3–12)
Neutro Abs: 5.3 10*3/uL (ref 1.7–7.7)

## 2010-07-18 LAB — CK TOTAL AND CKMB (NOT AT ARMC)
Relative Index: INVALID (ref 0.0–2.5)
Total CK: 99 U/L (ref 7–232)

## 2010-07-18 LAB — POCT CARDIAC MARKERS
CKMB, poc: <1 ng/mL — ABNORMAL LOW (ref 1.0–8.0)
Troponin i, poc: <0.05 ng/mL (ref 0.00–0.09)

## 2010-07-18 LAB — HEMOGLOBIN A1C
Hgb A1c MFr Bld: 6.3 % — ABNORMAL HIGH (ref <5.7)
Mean Plasma Glucose: 134 mg/dL — ABNORMAL HIGH (ref <117)

## 2010-07-18 LAB — URINE MICROSCOPIC-ADD ON

## 2010-07-18 LAB — TROPONIN I: Troponin I: <0.01 ng/mL (ref 0.00–0.06)

## 2010-07-19 LAB — BASIC METABOLIC PANEL
BUN: 10 mg/dL (ref 6–23)
CO2: 27 mEq/L (ref 19–32)
Chloride: 104 mEq/L (ref 96–112)
Creatinine, Ser: 0.91 mg/dL (ref 0.4–1.5)
Glucose, Bld: 122 mg/dL — ABNORMAL HIGH (ref 70–99)
Potassium: 4.2 mEq/L (ref 3.5–5.1)

## 2010-07-19 LAB — CARDIAC PANEL(CRET KIN+CKTOT+MB+TROPI)
Relative Index: INVALID (ref 0.0–2.5)
Troponin I: 0.06 ng/mL (ref 0.00–0.06)

## 2010-07-19 LAB — CBC
HCT: 39.8 % (ref 39.0–52.0)
MCH: 30.3 pg (ref 26.0–34.0)
MCV: 90 fL (ref 78.0–100.0)
RBC: 4.42 MIL/uL (ref 4.22–5.81)
WBC: 6.1 10*3/uL (ref 4.0–10.5)

## 2010-07-19 LAB — LIPID PANEL
Cholesterol: 137 mg/dL (ref 0–200)
HDL: 36 mg/dL — ABNORMAL LOW (ref >39)
Total CHOL/HDL Ratio: 3.8 RATIO
VLDL: 66 mg/dL — ABNORMAL HIGH (ref 0–40)

## 2010-07-20 LAB — BASIC METABOLIC PANEL
BUN: 8 mg/dL (ref 6–23)
Calcium: 9 mg/dL (ref 8.4–10.5)
Chloride: 106 mEq/L (ref 96–112)
Creatinine, Ser: 0.97 mg/dL (ref 0.4–1.5)

## 2010-07-20 LAB — CBC
HCT: 40.3 % (ref 39.0–52.0)
Hemoglobin: 13.7 g/dL (ref 13.0–17.0)
MCH: 30.9 pg (ref 26.0–34.0)
MCHC: 34 g/dL (ref 30.0–36.0)
MCV: 91 fL (ref 78.0–100.0)
Platelets: 155 10*3/uL (ref 150–400)
RBC: 4.43 MIL/uL (ref 4.22–5.81)
RDW: 13.3 % (ref 11.5–15.5)
WBC: 6 10*3/uL (ref 4.0–10.5)

## 2010-07-21 LAB — POCT ACTIVATED CLOTTING TIME: Activated Clotting Time: 387 seconds

## 2010-07-24 NOTE — Progress Notes (Signed)
Summary: chest pains   Phone Note Call from Patient Call back at Home Phone [phone]   Caller: Patient Summary of Call: chest pains Initial call taken by: Judie Grieve,  July 17, 2010 11:42 AM  Follow-up for Phone Call        Pt. called states he has a constant disconfort on the center of his chest and some time he feels it in his neck and throat.  pt. said is like  a muscle pain. It does not hurt enside his heart it is like skin flush, and when he put a pressure in his chest bone, it feels sore . Pt. denies SOB, nausea , vomit, swets nor dizziness. Pt. has not take NTG SL because he thinks  he does not need it . The disconfort is not grait enoug that he does not need to take the medication. It does not hurt enside his chest.  Pt. states he has a hiatal hernia and it could be that. B/P is 112/76, 111/80 and 118/74.Pt. states he call to make sure what it was. Pt. knows to call the office or go to the ER,  if  symptoms changed and if he feels that he has true CP. Pt. verbalized understanding. Follow-up by: Ollen Gross, RN, BSN,  July 17, 2010 12:29 PM  Additional Follow-up for Phone Call Additional follow up Details #1::        pt has scheduled an appointment for f/u Additional Follow-up by: Charolotte Capuchin, RN,  July 17, 2010 4:45 PM

## 2010-07-28 ENCOUNTER — Ambulatory Visit (INDEPENDENT_AMBULATORY_CARE_PROVIDER_SITE_OTHER): Payer: BC Managed Care – PPO | Admitting: Cardiology

## 2010-07-28 ENCOUNTER — Encounter: Payer: Self-pay | Admitting: Cardiology

## 2010-07-28 DIAGNOSIS — I2510 Atherosclerotic heart disease of native coronary artery without angina pectoris: Secondary | ICD-10-CM

## 2010-07-28 DIAGNOSIS — E7800 Pure hypercholesterolemia, unspecified: Secondary | ICD-10-CM

## 2010-08-05 NOTE — Assessment & Plan Note (Addendum)
Summary: 1 yr rov 414.01 401.1 pfh/sp  Medications Added ASPIRIN 325 MG  TABS (ASPIRIN) 1 by mouth daily PLAVIX 75 MG TABS (CLOPIDOGREL BISULFATE) 1 by mouth daily      Allergies Added:   Visit Type:  Initial Consult Primary Provider:  Marga Melnick MD  CC:  CAD.  History of Present Illness: The patient presents for evaluation after recent hospitalization for unstable angina. He had a catheterization on the day of admission and was found to have 95% ruptured plaque in the vein graft to a ramus intermediate. His symptoms had started earlier that week but became increased in severity and frequency and essentially continuous on the day of presentation. He denies any symptoms since that day. He has not yet started being as active as he usually is. However, he denies any chest pressure, neck or arm discomfort. He has had no new palpitations, presyncope or syncope. He has had no PND or orthopnea. He has had no weight gain or edema.  Current Medications (verified): 1)  Zetia 10 Mg Tabs (Ezetimibe) .... Take 1 Tablet By Mouth Once A Day 2)  Altace 10 Mg Caps (Ramipril) .... Take 1 Capsule By Mouth Once A Day 3)  Crestor 20 Mg Tabs (Rosuvastatin Calcium) .... Take 1 Tablet By Mouth Once A Day 4)  Aciphex 20 Mg  Tbec (Rabeprazole Sodium) .Marland Kitchen.. 1 By Mouth Once Daily 5)  Niaspan 1000 Mg  Tbcr (Niacin (Antihyperlipidemic)) .Marland Kitchen.. 1 By Mouth Qd 6)  Metoprolol Tartrate 50 Mg  Tabs (Metoprolol Tartrate) .... 1/2 Tab Bid 7)  Viagra 100 Mg Tabs (Sildenafil Citrate) .... Once Daily As Needed ;do Not Take With Ntg 8)  Nitrostat 0.4 Mg Subl (Nitroglycerin) .Marland Kitchen.. 1 By Mouth As Needed For Chest Pain 9)  Multivitamins   Tabs (Multiple Vitamin) .Marland Kitchen.. 1 By Mouth Daily 10)  Aspirin 325 Mg  Tabs (Aspirin) .Marland Kitchen.. 1 By Mouth Daily 11)  Fish Oil 1000 Mg Caps (Omega-3 Fatty Acids) .Marland Kitchen.. 1 By Mouth Once Daily 12)  Vitamin D3 1000 Unit Tabs (Cholecalciferol) .Marland Kitchen.. 1 By Mouth Once Daily 13)  Vitamin E 100 Unit Caps (Vitamin E)  .Marland Kitchen.. 1 By Mouth Once Daily 14)  Vitamin C 1000 Mg Tabs (Ascorbic Acid) .Marland Kitchen.. 1 By Mouth Once Daily 15)  Plavix 75 Mg Tabs (Clopidogrel Bisulfate) .Marland Kitchen.. 1 By Mouth Daily  Allergies (verified): 1)  * Tylox  Past History:  Past Medical History: Coronary artery disease     Nonobstructive coronary artery disease in the left circumflex       system.     Saphenous vein sequential graft to the acute marginal and distal        right carotid artery is widely patent with mild stenosis        proximally.     The left internal mammary artery to the left anterior descending is       widely patent.       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     Normal left ventricular function. Hypertension GERD Hyperlipidemia;  Fasting Hyperglycemia(790.29) Vitamin D deficiency(268.9) Colonic polyps, hx of Peyronie's disease Low testosterone  Dr Artis Flock, Urologist, [place]  Past Surgical History: Reviewed history from 11/08/2009 and no changes required. Colon polypectomy 2006 : tubular adenomas, Dr Russella Dar (due 2011) Endoscopy  : [facility] 2000, Dr Russella Dar Coronary artery bypass graft, 4 vessel 2001 Vasectomy  Review of Systems  As stated in the HPI and negative for all other systems.   Vital Signs:  Patient profile:   65 year old male Height:      69 inches Weight:      210 pounds BMI:     31.12 Pulse rate:   68 / minute Resp:     16 per minute BP sitting:   130 / 88  (right arm)  Vitals Entered By: Marrion Coy, CNA (July 28, 2010 1:01 PM)  Physical Exam  General:  Well developed, well nourished, in no acute distress. Head:  normocephalic and atraumatic Eyes:  PERRLA/EOM intact; conjunctiva and lids normal. Neck:  Neck supple, no JVD. No masses, thyromegaly or abnormal cervical nodes. Chest Wall:  well healed surgical scar Lungs:  Clear bilaterally to auscultation and percussion. Abdomen:  Bowel sounds positive; abdomen soft  and non-tender without masses, organomegaly, or hernias noted. No hepatosplenomegaly. Msk:  Back normal, normal gait. Muscle strength and tone normal. Extremities:  No clubbing or cyanosis. Neurologic:  Alert and oriented x 3. Skin:  Intact without lesions or rashes. Cervical Nodes:  no significant adenopathy Inguinal Nodes:  no significant adenopathy Psych:  Normal affect.   Detailed Cardiovascular Exam  Neck    Carotids: Carotids full and equal bilaterally without bruits.      Neck Veins: Normal, no JVD.    Heart    Inspection: no deformities or lifts noted.      Palpation: normal PMI with no thrills palpable.      Auscultation: regular rate and rhythm, S1, S2 without murmurs, rubs, gallops, or clicks.    Vascular    Abdominal Aorta: no palpable masses, pulsations, or audible bruits.      Femoral Pulses: normal femoral pulses bilaterally.      Pedal Pulses: normal pedal pulses bilaterally.      Radial Pulses: normal radial pulses bilaterally.      Peripheral Circulation: no clubbing, cyanosis, or edema noted with normal capillary refill.     EKG  Procedure date:  07/28/2010  Findings:      Sinus rhythm, premature atrial contractions, axis within normal limits, intervals within normal limits, no acute ST-T wave changes  Impression & Recommendations:  Problem # 1:  CORONARY ARTERY DISEASE (ICD-414.00) He is doing well since his PCI. We will continue with aggressive risk reduction. Orders: EKG w/ Interpretation (93000)  Problem # 2:  HYPERLIPIDEMIA (ICD-272.4) I reviewed the lipids done in the hospital. Triglycerides were 329, LDL 35 and HDL 36. He will stop his Zetia, increase his Niaspan to 1500 mg and decrease the EtOH  Problem # 3:  HYPERTENSION (ICD-401.9) He will continue the meds as listed.  Patient Instructions: 1)  Your physician recommends that you schedule a follow-up appointment in: 4 months with Dr Antoine Poche 2)  Your physician has recommended you make the  following change in your medication: Stop Zetia and take niacin 1,500 mg over the counter.  Appended Document: 1 yr rov 414.01 401.1    Will pt stay on Plavix for 4 months? Dr. Kirke Corin would suggest 12 months of Plavix if the patient can continue this.  At least one month is required.  I will see if he will stay on it until I see him in four months.

## 2010-08-22 LAB — URINALYSIS, ROUTINE W REFLEX MICROSCOPIC
Protein, ur: NEGATIVE mg/dL
Urobilinogen, UA: 0.2 mg/dL (ref 0.0–1.0)

## 2010-08-22 LAB — CBC
MCHC: 34.2 g/dL (ref 30.0–36.0)
MCV: 93.3 fL (ref 78.0–100.0)
Platelets: 175 10*3/uL (ref 150–400)
WBC: 8.5 10*3/uL (ref 4.0–10.5)

## 2010-08-22 LAB — DIFFERENTIAL
Basophils Relative: 0 % (ref 0–1)
Eosinophils Absolute: 0 10*3/uL (ref 0.0–0.7)
Neutrophils Relative %: 76 % (ref 43–77)

## 2010-08-22 LAB — BASIC METABOLIC PANEL
BUN: 7 mg/dL (ref 6–23)
CO2: 25 mEq/L (ref 19–32)
Chloride: 105 mEq/L (ref 96–112)
Creatinine, Ser: 1.09 mg/dL (ref 0.4–1.5)

## 2010-08-28 ENCOUNTER — Telehealth: Payer: Self-pay | Admitting: Cardiology

## 2010-08-28 NOTE — Telephone Encounter (Signed)
Pt calls today b/c yesterday he experienced some "discomfort less than a 1 " in both collarbones, just a twinge". The discomfort went away without taking NTG.  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 He denies any angina, shortness of breath, or dizziness.  His current bp is 113/68 hr 61.   Instructed pt on use of NTG and activate 911 if he experiences unrelieved angina.  Pt feels this was probably a pulled muscle from lifting the marine batteries. Mylo Red RN

## 2010-08-30 ENCOUNTER — Emergency Department (HOSPITAL_COMMUNITY)
Admission: EM | Admit: 2010-08-30 | Discharge: 2010-08-30 | Disposition: A | Discharge status: Home / Self Care | Payer: BC Managed Care – PPO | Attending: Emergency Medicine | Admitting: Emergency Medicine

## 2010-08-30 ENCOUNTER — Emergency Department (HOSPITAL_COMMUNITY): Payer: BC Managed Care – PPO

## 2010-08-30 DIAGNOSIS — E785 Hyperlipidemia, unspecified: Secondary | ICD-10-CM | POA: Insufficient documentation

## 2010-08-30 DIAGNOSIS — K219 Gastro-esophageal reflux disease without esophagitis: Secondary | ICD-10-CM | POA: Insufficient documentation

## 2010-08-30 DIAGNOSIS — M542 Cervicalgia: Secondary | ICD-10-CM | POA: Insufficient documentation

## 2010-08-30 DIAGNOSIS — Z79899 Other long term (current) drug therapy: Secondary | ICD-10-CM | POA: Insufficient documentation

## 2010-08-30 DIAGNOSIS — I2510 Atherosclerotic heart disease of native coronary artery without angina pectoris: Secondary | ICD-10-CM | POA: Insufficient documentation

## 2010-08-30 DIAGNOSIS — I10 Essential (primary) hypertension: Secondary | ICD-10-CM | POA: Insufficient documentation

## 2010-08-30 DIAGNOSIS — R071 Chest pain on breathing: Secondary | ICD-10-CM | POA: Insufficient documentation

## 2010-08-30 LAB — BASIC METABOLIC PANEL
Chloride: 105 mEq/L (ref 96–112)
GFR calc non Af Amer: >60 mL/min (ref >60)
Potassium: 3.8 mEq/L (ref 3.5–5.1)
Sodium: 136 mEq/L (ref 135–145)

## 2010-08-30 LAB — POCT CARDIAC MARKERS
CKMB, poc: <1 ng/mL — ABNORMAL LOW (ref 1.0–8.0)
Troponin i, poc: <0.05 ng/mL (ref 0.00–0.09)

## 2010-08-30 LAB — CBC
HCT: 42.8 % (ref 39.0–52.0)
Hemoglobin: 14.9 g/dL (ref 13.0–17.0)
MCV: 89.4 fL (ref 78.0–100.0)
RBC: 4.79 MIL/uL (ref 4.22–5.81)
WBC: 6.4 10*3/uL (ref 4.0–10.5)

## 2010-08-30 LAB — DIFFERENTIAL
Lymphocytes Relative: 22 % (ref 12–46)
Lymphs Abs: 1.4 10*3/uL (ref 0.7–4.0)
Neutrophils Relative %: 71 % (ref 43–77)

## 2010-09-01 ENCOUNTER — Telehealth: Payer: Self-pay | Admitting: Cardiology

## 2010-09-01 NOTE — Telephone Encounter (Signed)
Pt calling stating he was in ED over weekend with chest pain  and was told to f/u with [place] in 4-5 days--evidently he had a problem scheduling this appoint and was calling to complain--in meantime an appoint was found and pt is OK now and will be seeing s. Weaver on 4/20

## 2010-09-04 ENCOUNTER — Encounter: Payer: Self-pay | Admitting: Physician Assistant

## 2010-09-05 ENCOUNTER — Encounter: Payer: Self-pay | Admitting: Physician Assistant

## 2010-09-05 ENCOUNTER — Ambulatory Visit (INDEPENDENT_AMBULATORY_CARE_PROVIDER_SITE_OTHER): Payer: BC Managed Care – PPO | Admitting: Physician Assistant

## 2010-09-05 VITALS — BP 160/100 | HR 56 | Ht 69.0 in | Wt 212.0 lb

## 2010-09-05 DIAGNOSIS — I25810 Atherosclerosis of coronary artery bypass graft(s) without angina pectoris: Secondary | ICD-10-CM

## 2010-09-05 DIAGNOSIS — R079 Chest pain, unspecified: Secondary | ICD-10-CM | POA: Insufficient documentation

## 2010-09-05 DIAGNOSIS — I10 Essential (primary) hypertension: Secondary | ICD-10-CM

## 2010-09-05 MED ORDER — AMLODIPINE BESYLATE 5 MG PO TABS: 5.0000 mg | ORAL_TABLET | Freq: Every day | ORAL | Status: DC

## 2010-09-05 NOTE — Assessment & Plan Note (Signed)
Continue aspirin and Plavix.  Obtain an exercise Myoview as noted.

## 2010-09-05 NOTE — Assessment & Plan Note (Signed)
Uncontrolled.  Start amlodipine 5 mg daily.

## 2010-09-05 NOTE — Progress Notes (Signed)
History of Present Illness: Primary Cardiologist:  Dr. Rollene Rotunda  Kevin Webb is a 65 y.o. male with a h/o CAD, s/p CABG in 2000 and recent admission for [place] treated with PCI with a BMS to the S-RI.  He has felt well since his PCI up until the last week.  He is very active.  He lifted some batteries away 100 pounds a few days ago.  After this he started having an ache in his upper chest.  He denies any radiation to his neck or arms.  It is not made worse by exertion.  It comes and goes.  He is under a lot of stress.  His phone and pager both range several times while we were in the exam room together.  He denies any shortness of breath.  He denies syncope.  He denies orthopnea, PND or edema.  He denies outpatient.  Prior to his PCI, he had discomfort going up into his jaw and down his left arm.  He he felt fatigued with certain activities.  All of these symptoms have completely resolved.  He did very emergency room on April 14.  His cardiac enzymes were negative.  CBC and basic metabolic panel were unremarkable.  Chest x-ray was normal.  His EKG had no acute changes.  He returns today for followup.  Of note, he has had some chest wall pain off-and-on since his bypass surgery.  This pain seems similar.  Past Medical History  Diagnosis Date  . CAD (coronary artery disease)     a. s/p CABG 12/00;  b. s/p BMS to S-RI 3/12;   c. cath 3/12: L-LAD ok, S-RI 95% tx with BMS, S-AM/RCA ok, EF 60%  . HTN (hypertension)   . GERD (gastroesophageal reflux disease)   . Hyperlipidemia   . Hyperglycemia   . Vitamin D deficiency   . Colonic polyp   . Peyronie disease   . Low testosterone   . Sinus bradycardia     Current Outpatient Prescriptions  Medication Sig Dispense Refill  . Ascorbic Acid (VITAMIN C) 1000 MG tablet Take 1,000 mg by mouth daily.        Marland Kitchen aspirin 325 MG tablet Take 325 mg by mouth daily.        . Cholecalciferol (VITAMIN D3) 1000 UNITS CAPS 1 tab po qd       . clopidogrel (PLAVIX)  75 MG tablet Take 75 mg by mouth daily.        Marland Kitchen co-enzyme Q-10 30 MG capsule Take 30 mg by mouth 3 (three) times daily.        . metoprolol tartrate (LOPRESSOR) 25 MG tablet Take 25 mg by mouth 2 (two) times daily. 1/2 tab bid       . Multiple Vitamin (MULTIVITAMIN) tablet Take 1 tablet by mouth daily.        . niacin (NIASPAN) 1000 MG CR tablet Take 1,000 mg by mouth at bedtime.        . nitroGLYCERIN (NITROSTAT) 0.4 MG SL tablet Place 0.4 mg under the tongue every 5 (five) minutes as needed.        . Omega-3 Fatty Acids (FISH OIL) 1000 MG CAPS 1 tab po qd       . RABEprazole (ACIPHEX) 20 MG tablet Take 20 mg by mouth daily.        . ramipril (ALTACE) 10 MG capsule Take 10 mg by mouth daily.        . rosuvastatin (CRESTOR) 20 MG  tablet Take 20 mg by mouth daily.        . sildenafil (VIAGRA) 100 MG tablet Take 100 mg by mouth daily as needed.        . vitamin E 100 UNIT capsule Take 100 Units by mouth daily.        Marland Kitchen DISCONTD: metoprolol (TOPROL-XL) 50 MG 24 hr tablet Take 50 mg by mouth daily. 1/2 tab po bid         Allergies  Allergen Reactions  . Oxycodone-Acetaminophen     REACTION: RASH   History  Substance Use Topics  . Smoking status: Never Smoker   . Smokeless tobacco: Never Used  . Alcohol Use: Yes   ROS:  See the history of present illness.  He denies fevers, chills, cough, melena, hematochezia.  All other systems reviewed and negative.  Vital Signs: BP 160/100  Pulse 56  Ht 5\' 9"  (1.753 m)  Wt 212 lb (96.163 kg)  BMI 31.31 kg/m2  PHYSICAL EXAM: Well nourished, well developed, in no acute distress HEENT: normal Neck: no JVD Cardiac:  normal S1, S2; RRR; no murmur Lungs:  clear to auscultation bilaterally, no wheezing, rhonchi or rales Abd: soft, nontender, no hepatomegaly Ext: no edema Skin: warm and dry Neuro:  CNs 2-12 intact, no focal abnormalities noted  EKG:  Sinus bradycardia, rate 56, leftward axis, no significant change since previous  tracing.  ASSESSMENT AND PLAN:

## 2010-09-05 NOTE — Assessment & Plan Note (Signed)
His symptoms sound atypical for ischemia.  I think they're more related to chest wall pain than anything.  He is also under a great deal of stress.  He did take some lorazepam recently and it helped his symptoms.  His blood pressure is elevated.  I think this may also be related to stress.  His EKG is normal.  He had normal cardiac enzymes a few days ago and emergency room.  I have recommended that we proceed with an exercise Myoview to eliminate the possibility of ischemia.  This is doubtful, however given his recent PCI, I think this is important.  He remains on aspirin and Plavix.  He knows to go the emergency room should he have symptoms reminiscent of his previous angina.

## 2010-09-05 NOTE — Patient Instructions (Signed)
Your physician recommends that you schedule a follow-up appointment in: 1-2 WEEKS WITH WITH EITHER SCOTT WEAVER, PA-C OR DR. HOCHREIN IF HE IS AVAILABLE, IF NOT SCHEDULE WITH SCOTT WEAVER, PA-C SAME DR. HOCHREIN IS IN THE OFFICE.  Your physician has requested that you have en exercise stress Myoview 786.50. For further information please visit [email or web address]. Please follow instruction sheet, as given.   Your physician has recommended you make the following change in your medication: START NORVASC (AMLODIPINE) 5 MG 1 TABLET DAILY, A PRESCRIPTION HAS BEEN SENT TO [facility] TODAY.

## 2010-09-11 ENCOUNTER — Ambulatory Visit (HOSPITAL_COMMUNITY): Payer: BC Managed Care – PPO | Attending: Cardiology | Admitting: Radiology

## 2010-09-11 VITALS — Ht 69.0 in | Wt 207.0 lb

## 2010-09-11 DIAGNOSIS — R0789 Other chest pain: Secondary | ICD-10-CM

## 2010-09-11 DIAGNOSIS — I25810 Atherosclerosis of coronary artery bypass graft(s) without angina pectoris: Secondary | ICD-10-CM

## 2010-09-11 DIAGNOSIS — R079 Chest pain, unspecified: Secondary | ICD-10-CM

## 2010-09-11 MED ORDER — TECHNETIUM TC 99M TETROFOSMIN IV KIT
10.8000 | PACK | Freq: Once | INTRAVENOUS | Status: AC | PRN
  Administered 2010-09-11: 11 via INTRAVENOUS

## 2010-09-11 MED ORDER — TECHNETIUM TC 99M TETROFOSMIN IV KIT
33.0000 | PACK | Freq: Once | INTRAVENOUS | Status: AC | PRN
  Administered 2010-09-11: 33 via INTRAVENOUS

## 2010-09-11 NOTE — Progress Notes (Signed)
[facility] SITE 3 NUCLEAR MED [address] [place] [address] [phone]  Cardiology Nuclear Med Study  Kevin Webb is a 65 y.o. male [id number] 14-Dec-1945   Nuclear Med Background Indication for Stress Test:  Evaluation for Ischemia, Stent Patency and PTCA Patency History: 3/12 Angioplasty,2000 CABG x 4, 3/12 Heart Catheterization- EF 60%, 5/08 Myocardial Perfusion Study- EF 66% and 3/12 Stents-SVG-Ramus Cardiac Risk Factors: Family History - CAD, Hypertension and Lipids  Symptoms:  Chest Pain and Chest Pain with exertion (last date of chest discomfort- last week)   Nuclear Pre-Procedure Caffeine/Decaff Intake:  None NPO After: 7:00pm   Lungs:  Clear IV 0.9% NS with Angio Cath:  18g  IV Site: R Antecubital  IV Started by:  Stanton Kidney, EMT-P  Chest Size (in):  44 Cup Size: n/a  Height: 5\' 9"  (1.753 m)  Weight:  207 lb (93.895 kg)  BMI:  Body mass index is 30.57 kg/(m^2). Tech Comments:  Metoprolol held > 24 hours, per patient.    Nuclear Med Study 1 or 2 day study: 1 day  Stress Test Type:  Stress  Reading MD: Kristeen Miss, MD  Order Authorizing Provider:  Dr. Rollene Rotunda  Resting Radionuclide: Technetium 70m Tetrofosmin  Resting Radionuclide Dose: 10.8 mCi   Stress Radionuclide:  Technetium 51m Tetrofosmin  Stress Radionuclide Dose: 33.0 mCi           Stress Protocol Rest HR: 67 Stress HR: 150  Rest BP: 154/94 Stress BP: 189/101  Exercise Time (min): 10:30 METS: 12.6   Predicted Max HR: 158 bpm % Max HR: 94.94 bpm Rate Pressure Product: 44010   Dose of Adenosine (mg):  n/a Dose of Lexiscan: n/a mg  Dose of Atropine (mg): n/a Dose of Dobutamine: n/a mcg/kg/min (at max HR)  Stress Test Technologist: Bonnita Levan, RN  Nuclear Technologist:  Domenic Polite, CNMT     Rest Procedure:  Myocardial perfusion imaging was performed at rest 45 minutes following the intravenous administration of Technetium 66m Tetrofosmin. Rest ECG:  NSR  Stress Procedure:  The patient exercised for 10:30.  The patient stopped due to dyspnea and fatigue and he denied any chest pain.  There were no significant ST-T wave changes. Frequent PVC's noted. Technetium 38m Tetrofosmin was injected at peak exercise and myocardial perfusion imaging was performed after a brief delay. Stress ECG: No significant change from baseline ECG  QPS Raw Data Images:  Normal; no motion artifact; normal heart/lung ratio. Stress Images:  There is decreased uptake in the apex. Rest Images:  There is decreased uptake in the apex. Subtraction (SDS):  No evidence of ischemia. Transient Ischemic Dilatation (Normal <1.22):  1.05 Lung/Heart Ratio (Normal <0.45):  0.29  Quantitative Gated Spect Images QGS EDV:  95 ml QGS ESV:  38 ml QGS cine images:  NL LV Function; NL Wall Motion QGS EF: 60%  Impression Exercise Capacity:  Good exercise capacity. BP Response:  Normal blood pressure response. Clinical Symptoms:  No chest pain. ECG Impression:  No significant ST segment change suggestive of ischemia. Comparison with Prior Nuclear Study: No significant change from previous study  Overall Impression:  Normal stress nuclear study.  No evidence of ischemia.  There is mild apical thinning but the apex contracts normally. Normal LV function.    Elyn Aquas., MD

## 2010-09-12 NOTE — Progress Notes (Addendum)
ROUTED TO DR.HOCHREIN.Falecha Clark   Negative study.  No further work up needed.

## 2010-09-15 ENCOUNTER — Ambulatory Visit: Payer: BC Managed Care – PPO | Admitting: Physician Assistant

## 2010-09-15 NOTE — Progress Notes (Signed)
Pt aware of myoview results 

## 2010-09-30 NOTE — Assessment & Plan Note (Signed)
[facility]                            CARDIOLOGY OFFICE NOTE   Kevin Webb, Kevin Webb                       MRN:          [id number]  DATE:03/31/2007                            DOB:          [birth year]    PRIMARY CARE PHYSICIAN:  Dr. Alwyn Ren   REASON FOR PRESENTATION:  Evaluate patient with coronary disease.   HISTORY OF PRESENT ILLNESS:  The patient presents for followup of his  known coronary disease. He is 65 years old. He has been doing very well  since I last saw him. He has not been exercising routinely though he  remains active. He says he did a brisk walk on [facility] the other  day and had no problems with chest discomfort, neck or arm discomfort.  He has had no palpitations, pre-syncope or syncope. He has had no PND or  orthopnea.   The patient has been drinking a fair amount of alcohol, about four beers  a night. This may account for his problems with his weight.   PAST MEDICAL HISTORY:  1. Coronary artery disease, status post coronary artery bypass graft      in 2000 (Left anterior descending artery occluded proximally,      diagonal ostial 30-40% stenosis, circumflex and the AV groove 60%      stenosis, before mid obtuse marginal, large ramus intermediate      occluded at the ostium. The right coronary artery is occluded      proximally. Left internal mammary artery to the left anterior      descending artery was widely patent, saphenous vein graft      sequential to the acute marginal distal right coronary artery had      mild diffuse luminal irregularities. Saphenous vein graft to the      ramus intermediate had 25% stenosis with diffuse luminal      irregularities. Ejection fraction of 55%. The patient had a      negative stress perfusion study to evaluate the circumflex lesion.      The ejection fraction was 66% with no evidence of ischemia or      infarct.)  2. Dyslipidemia.  3. Hypertension.  4. Obesity.  5.  Vasectomy.   ALLERGIES:  TILOX CAUSED A RASH.   MEDICATIONS:  1. Aspirin 81 mg daily.  2. Aciphex 20 mg daily.  3. Folic acid 1 mg daily.  4. Altace 10 mg daily.  5. Crestor 20 mg daily.  6. Co-enzyme Q.  7. Calcium/magnesium.  8. Selenium.  9. Zetia 10 mg daily.  10.Vitamin E.  11.Lopressor 25 mg b.i.d.  12.Niaspan 2000 mg daily.   REVIEW OF SYSTEMS:  As stated in the HPI and otherwise negative for  other systems.   PHYSICAL EXAMINATION:  The patient is in no distress. Blood pressure  159/98, heart rate 54 and regular. Weight 213 pounds. Body Mass Index is  31.  HEENT: Eyelids unremarkable. Pupils equal, round, and reactive to light.  Fundi not visualized. Oral mucosa is unremarkable.  NECK: No jugular venous distention, waveform within normal limits.  Carotid upstroke brisk  and symmetrical. No bruits, no thyromegaly.  LYMPHATICS: No cervical, axillary or inguinal adenopathy.  LUNGS: Clear to auscultation bilaterally.  BACK: No costovertebral angle tenderness.  CHEST: Well-healed sternotomy scar.  HEART: PMI not displaced or sustained. S1, S2 within normal limits. No  S3. No S4. No clicks, rub or murmurs.  ABDOMEN: Obese, positive bowel sounds, normal in frequency and pitch. No  bruits. No rebounds. No guarding. No midline pulsatile mass. No  hepatomegaly or splenomegaly.  SKIN: No rashes, no nodules.  EXTREMITIES: 2+ pulses throughout. No edema, cyanosis or clubbing.  NEURO: Oriented to person, place and time. Cranial nerves II-XII grossly  intact. Motor grossly intact throughout.   EKG: Sinus rhythm, rate 55, axis within normal limits, intervals within  normal limits, no acute ST-T wave changes.   ASSESSMENT/PLAN:  1. Coronary disease. The patient is having no ongoing symptoms. No      further cardiovascular testing is suggested. He needs to      participate better in secondary risk reduction.  2. Dyslipidemia. The patient is on an excellent regimen. He was not       quite at target the last time he was checked in February. He      actually believes he is on Niaspan 2000 mg. He remains on the      Crestor and Zetia. He can have this repeated per Dr. Alwyn Ren. Goal      is LDL of less than 100 and HDL greater than 40 and triglycerides      less than 150.  3. Obesity. We have discussed this. He needs to lose weight with diet      and exercise. In particular, I think he needs to stop the alcohol      and we discussed this.  4. Hypertension. Blood pressure is elevated. I took it again and      verified this. I told him needs weight loss with therapeutic      lifestyle changes to bring this back down with a goal of 140/80.  5. Followup. I will see the patient back in six months or sooner if      needed.    Rollene Rotunda, MD, [facility]  Electronically Signed   JH/MedQ  DD: 03/31/2007  DT: 03/31/2007  Job #: [id number]   cc:   Titus Dubin. Alwyn Ren, MD,FACP,FCCP

## 2010-09-30 NOTE — Discharge Summary (Signed)
[name], [name] NO.:  [id number]   MEDICAL RECORD NO.:  [id number]          PATIENT TYPE:  OBV   LOCATION:  3701                         FACILITY:  [facility]   PHYSICIAN:  Rollene Rotunda, MD, FACCDATE OF BIRTH:  10-Jun-1945   DATE OF ADMISSION:  09/11/2006  DATE OF DISCHARGE:  09/13/2006                               DISCHARGE SUMMARY   PROCEDURES:  1. Cardiac catheterization.  2. Coronary arteriogram  3. Left ventriculogram  4. LIMA arteriogram.  5. Graft angiogram.   TIME AT DISCHARGE:  35 minutes.   HOSPITAL COURSE:  [name] is a  65 year old male with known coronary  artery disease.  He had bypass surgery in 2000 and his last stress test  was in 2002.  He had chest and throat discomfort as well as fatigue.  He  came to the hospital where he was admitted for further evaluation and  treatment.   His cardiac enzymes were negative for MI.  His total cholesterol was 187  with triglycerides of 339, HDL 44,  LDL 75.  TSH was within normal  limits and fecal occult blood was negative.  Dr. Antoine Poche evaluated him  and felt cardiac catheterization was indicated and this was performed on  09/13/2006.   The cardiac catheterization showed severe native three-vessel disease,  with the LAD totaled, the RCA totaled and the ramus intermedius which  was a large branching vessel totaled as well.  The circumflex has 60%  stenosis before a large obtuse marginal.  The LIMA to LAD was patent and  the SVG to acute marginal and RCA had some mild diffuse irregularities.  The SVG to ramus had a proximal 25% stenosis with luminal  irregularities.  His EF was 55% with normal wall motion.   Dr. Antoine Poche evaluated the films and felt that since all grafts were  patent with no significant disease, he needed a stress test to evaluate  the significance of the circumflex artery.  Because he is currently  symptom free this can be done as an outpatient.  Post cath, his groin  was  stable and he was ambulating without chest pain or shortness of  breath.  He was considered stable for discharge on 09/13/2006 with  outpatient follow-up arranged.   DISCHARGE INSTRUCTIONS:  1. His activity level is to be increased gradually.  He is to call our      office for any problems with the cath site.  He is to stick to a      low-fat diabetic diet (borderline hemoglobin A1c and elevated      triglycerides).  He is to follow up with Dr. Antoine Poche on May 9 at      noon,  and he is to get a treadmill stress test on May 2 at 7:45.      He is to follow up with Dr. Alwyn Ren as needed.   DISCHARGE MEDICATIONS:  1. Aspirin 81 mg a day  2. Folate 1 mg daily  3. AcipHex 20 mg a day]  4. Altace 10 mg a day.  5. Crestor 20 mg a day.  6. Niaspan 1000 mg b.i.d.  7. Zetia 10 mg daily  8. Metoprolol 50 mg 1/2 tablet b.i.d.Bjorn Loser Barrett, PA-C      Rollene Rotunda, MD, [facility]  Electronically Signed    RB/MEDQ  D:  09/13/2006  T:  09/13/2006  Job:  [id number]   cc:   Peyton Najjar, MD

## 2010-09-30 NOTE — Cardiovascular Report (Signed)
NAMETERRELLE, RUFFOLO NO.:  [id number]   MEDICAL RECORD NO.:  [id number]          PATIENT TYPE:  OBV   LOCATION:  3701                         FACILITY:  [facility]   PHYSICIAN:  Rollene Rotunda, MD, FACCDATE OF BIRTH:  August 06, 1945   DATE OF PROCEDURE:  09/13/2006  DATE OF DISCHARGE:                            CARDIAC CATHETERIZATION   PRIMARY:  Dr. Lona Kettle.   PROCEDURE:  Left heart catheterization/coronary arteriography.   INDICATIONS:  The patient with chest pain and previous bypass grafting  in 2000.   PROCEDURE NOTE:  Left heart catheterization performed of the right  femoral artery.  The artery was cannulated using anterior wall puncture.  A number 6-French arterial sheath was inserted via the modified  Seldinger technique.  Preformed Judkins, pigtail, LIMA catheters were  used.  The patient tolerated the procedure well and left the lab in  stable condition.   RESULTS:  Hemodynamics LV 167/26, AO 163/98.  Coronaries:  The left main  was normal.  The LAD was occluded proximally.  It was a small vessel.  It filled via the LIMA.  There was a diagonal that back-filled from the  LIMA.  It was moderate to large.  Ostial 30% to 40% stenosis.  The  circumflex in the AV groove included a ramus intermediate which was  large and branching, and occluded at the ostium.  It filled via the vein  graft.  The circ essentially terminated as a large mid-obtuse marginal.  There was a 60% stenosis before this mid-obtuse marginal.  The right  coronary artery is a large dominant vessel.  It was occluded proximally.  PDA and posterolateral were seen to fill via the vein graft.  There were  small vessels with mild diffuse disease but free of high-grade stenosis.  There was a large acute marginal which was bypassed.  It was free of  high-grade stenosis as well with diffuse luminal irregularities.  Grafts:  LIMA to the LAD was widely patent.  A saphenous vein graft  sequential to  the acute marginal and distal right coronary artery had  mild diffuse luminal irregularities.  Saphenous vein graft to the ramus  intermediate had proximal 25% stenosis with diffuse luminal  irregularities.  Left ventriculogram:  The left ventriculogram was  obtained in the RAO projection.  The EF was 55% with normal wall motion.   CONCLUSION:  Three-vessel coronary artery disease with patent grafts and  preserved left ventricular function.  There is a lesion in the circ  marginal.  This appears to be about 60% stenosed.  Without objective  evidence of ischemia, I am not inclined to think this is a culprit.   PLAN:  The plan will be an outpatient Cardiolite to evaluate the  hemodynamic significance of the circ marginal lesion.  He will continue  to have aggressive secondary risk reduction.     Rollene Rotunda, MD, [facility]  Electronically Signed    JH/MEDQ  D:  09/13/2006  T:  09/13/2006  Job:  [id number]   cc:   Titus Dubin. Alwyn Ren, MD,FACP,FCCP

## 2010-09-30 NOTE — Assessment & Plan Note (Signed)
[facility]                            CARDIOLOGY OFFICE NOTE   AZEEZ, DUNKER                       MRN:          [id number]  DATE:09/24/2006                            DOB:          01-25-1946    PRIMARY:  Dr. Alwyn Ren.   REASON FOR PRESENTATION:  Evaluate the patient with chest pain and  recent catheterization.   HISTORY OF PRESENT ILLNESS:  The patient returns for followup of the  above.  He was hospitalized on April 26 with chest discomfort.  He ruled  out for myocardial infarction.  He did have a cardiac catheterization  that demonstrated patent bypass grafts.  He had a LAD that was occluded  proximally.  Diagonal had osteal 30-40% stenosed.  The circumflex in the  AV grove.  He had a 60% stenosis before the mid obtuse marginal.  There  was a large ramus intermedius that was occluded at the osteum.  The  right coronary artery was occluded proximally.  LIMA to the lad was  widely patent.  Saphenous vein graft sequentially to the acute marginal  and distal right coronary artery had mild diffuse luminal  irregularities. Saphenous vein graft to the ramus intermediate had 25%  stenosis with diffuse luminal irregularities.  The EF was 55%.  I did  send him for a stress perfusion study to evaluate the circumflex lesion.  This demonstrated an EF of 66% with no evidence of ischemia or infarct.   Since then, the patient has done well.  He has had some muscle soreness  when he does activity in his yard.  He works vigorously.  However, he  has not had any chest discomfort, neck discomfort, arm discomfort,  activity-induced nausea, vomiting, or excessive diaphoresis.  He has had  no palpitations, pre-syncope, or syncope.  He denies any PND or  orthopnea.  He has had a little discomfort in his groin that is slowly  improving.   PAST MEDICAL HISTORY:  Coronary artery disease status post coronary  artery bypass grafting in 2000 (see details above).   Dyslipidemia.  Hypertension.  Vasectomy.   ALLERGIES:  TYLOX caused rash.   MEDICATIONS:  1. Aspirin 81 mg daily.  2. AcipHex 20 mg daily.  3. Folic acid.  4. Altace 10 mg daily.  5. Crestor 20 mg daily.  6. Coenzyme Q.  7. Calcium.  8. Magnesium.  9. Selenium.  10.Zetia 10 mg daily.  11.Niaspan 500 mg nightly.  12.Vitamin E.  13.Lopressor 25 mg b.i.d.   REVIEW OF SYSTEMS:  As stated in the HPI and otherwise negative for  other systems.   PHYSICAL EXAMINATION:  The patient is in no distress.  Blood pressure 122/88, heart rate 62 and regular, weight 210 pounds,  body mass index 31.  NECK:  No jugular venous distension at 45 degrees.  Carotid upstroke  brisk and symmetric.  No bruits, thyromegaly.  LYMPHATICS:  No adenopathy.  LUNGS:  Clear to auscultation bilaterally.  BACK:  No costovertebral angle tenderness.  CHEST:  Unremarkable, except for a well-healed sternotomy scar.  HEART:  PMI not displaced or  sustained, S1 and S2 within normal limits,  no S3, no S4, no clicks, rubs, or murmurs.  ABDOMEN:  Obese, positive bowel sounds, normal in frequency and pitch,  no bruits, rebound, guarding.  No midline pulsatile mass, hepatomegaly,  splenomegaly.  SKIN:  No rashes, no nodules.  EXTREMITIES:  With 2+ pulses, no edema.   ASSESSMENT AND PLAN:  1. Coronary artery disease.  The patient is doing well.  No further      cardiovascular testing is suggested.  He will continue with      secondary risk reduction.  2. Dyslipidemia.  Excellent lipid profile while in the hospital.  He      will continue with the medications as listed.  3. Hypertension.  Blood pressure is well controlled.  He will continue      with medications.  4. Weight.  We did discuss the need to lose weight with diet and      exercise.  5. Followup.  I will see him back in about 6 months or sooner if      needed.     Rollene Rotunda, MD, [facility]     JH/MedQ  DD: 09/24/2006  DT: 09/24/2006  Job #:  [id number]   cc:   Titus Dubin. Alwyn Ren, MD,FACP,FCCP

## 2010-09-30 NOTE — H&P (Signed)
Kevin Webb, Kevin Webb NO.:  [id number]   MEDICAL RECORD NO.:  [id number]          PATIENT TYPE:  EMS   LOCATION:  MAJO                         FACILITY:  [facility]   PHYSICIAN:  Lorain Childes, MD DATE OF BIRTH:  [birth year]   DATE OF ADMISSION:  09/11/2006  DATE OF DISCHARGE:                              HISTORY & PHYSICAL   PRIMARY CARDIOLOGIST:  Rollene Rotunda, MD, [facility].   PRIMARY CARE PHYSICIAN:  Titus Dubin. Alwyn Ren, MD, FACP, FCCP.   CHIEF COMPLAINT:  Discomfort and fatigue.   HISTORY OF PRESENT ILLNESS:  The patient is a 65 year old gentleman with  known coronary disease, status post CABG in December 2000.  Cardiolite  stress test in 2002 which did not reveal any ischemia.  He now presents  with complaints of chest/throat discomfort.  The patient has been quite  active recently.  Yesterday, he noted that, when he was walking up an  incline, he had some heavy breathing.  However, no throat discomfort at  that time.  He was digging out rocks and felt a little fatigue following  that and also had some shortness of breath associated with it.  This  morning he awoke and he felt sluggish, not having his normal energy  level.  Later in the day, he developed the throat discomfort around 6  p.m.  It was similar to his prior anginal episode.  He never has had  frank chest discomfort.  He also reported some shortness of breath and  fatigue associated with this.  His girlfriend noted that his hands were  clammy and his color was not right.  He walked outside and walked up an  incline.  He did not develop worsening of his symptoms.  However,  because of just not feeling well, he did take a nitroglycerine around 7  p.m.  He did feel a little bit better but he came to the emergency room  for further evaluation and management.  He currently denies any throat  discomfort or any pain.   PAST MEDICAL HISTORY:  1. CAD, status post CABG in December 2000 with a LIMA to the  LAD, vein      graft to the intermedius, vein graft to acute marginal with      sequential graft to the distal RCA.  He was admitted in December      2002 for chest pain at that time with throat discomfort.  He had a      Cardiolite in which he exercised for 12 minutes, 20 seconds.  He      achieved 94% predicted maximum heart rate.  He denied any chest      pain during his study and there were no acute ST changes, EF noted      to be 63%.  He had no scar, no ischemia.  He was seen by Dr.      Antoine Poche in February 2008 and he was doing well without any chest      or throat discomfort.  2. Dyslipidemia.  3. Hypertension.  4. Status post vasectomy.   MEDICATIONS:  1. Aspirin 81 mg p.o. daily.  2. Aciphex 30 mg p.o. daily.  3. Folate 1 mg p.o. daily.  4. Altace 10 mg p.o. daily.  5. Crestor 20 mg p.o. daily.  6. Cozyme Q one tablet daily.  7. Zetia 10 mg p.o. daily.  8. Lopressor 25 mg p.o. b.i.d.  9. Niaspan daily.   ALLERGIES:  TYLOX WHICH CAUSES A RASH.   SOCIAL HISTORY:  He denies any tobacco or ethanol.  No drug use.  He  does drink two beers and 2 ounces of whiskey or three or four beers  daily.  This varies.  He does not use any stimulating herbal medication  but does take vitamin C, fish oil, folate, vitamin E.  He follows a  regular diet.  He is trying to watch it and lose weight.  He does not  exercise regularly but he is quite active with his work, his walking on  a treadmill and jogging and limited due to a knee and foot problem.   FAMILY HISTORY:  Mother died at the age of 32.  She had a CABG at the  age of 83.  Father died at the age of 67 from an MI.  He has one brother  who is 29 and alive and healthy.   REVIEW OF SYSTEMS:  VITAL SIGNS:  He denies any fever or chills.  HEENT:  No headache or visual changes.  SKIN:  No skin rashes or lesions.  LUNGS:  He has had chest pain and shortness of breath as recorded in the  HPI.  No orthopnea or PND.  EXTREMITIES:  No  lower extremity edema.  HEART:  No palpitations, no syncope, no coughing or wheezing.  GU:  He  denies any urinary symptoms.  No hematuria, no weakness, no numbness.  He denies any underneath diarrhea, no bright-red blood per rectum, no  melena, no hematemesis.  GI:  He does have reflux which is fairly well  controlled with his Aciphex.  His symptoms seem different than his  reflux symptoms.  ALL OTHER SYSTEMS:  Negative.   PHYSICAL EXAMINATION:  VITAL SIGNS:  Temperature 97.9, pulse 64,  respirations 16, blood pressure 143/84.  He has saturation of 98% on  room air.  GENERAL:  He is a very pleasant man, comfortable, in no acute distress.  HEENT:  Normocephalic and atraumatic.  NECK:  JVP is approximately 6 cm, there is 2+ carotid upstroke.  There  are no bruits present.  CARDIOVASCULAR EXAM:  Normal S1, S2.  Regular rate and rhythm.  His PMI  is not displaced.  His pulses are 2+ throughout.  There are no bruits.  LUNGS:  Clear to auscultation throughout.  No wheezing, rales or  rhonchi.  ABDOMEN:  Soft, nontender, normoactive bowel sounds.  No organomegaly.  EXTREMITIES:  He has no edema.  Intact distal pulses.  NEUROLOGIC:  Nonfocal.   EKG shows a rate of 67, sinus rhythm, normal axis.  His P-R interval is  125, QRS duration is 117 with an incomplete right bundle branch block.  His Q-Tc is 449 msec.  He has no ischemic changes.   LABORATORY DATA:  His hematocrit is 44, his potassium is 4, creatinine  0.9 and glucose is 115.  CK-MB is less than 1, troponin less than 0.045.  Myoglobin 47.3.   ASSESSMENT/PLAN:  The patient is a 65 year old gentleman with known  coronary disease, status post coronary artery bypass graft, now  presenting with discomfort similar to his  prior angina.  1. Coronary artery disease.  The patient has known disease.  His last      stress test was in 2002.  He now has chest/throat discomfort where     he was previously doing quite well without any symptoms.   We will      cycle cardiac enzymes.  Follow his EKG.  We will continue his ASA,      metoprolol, statin and ACE inhibitor.  We will start him on Lovenox      and nitrate with nitro paste one inch topically.  We will consider      stress test  or cath depending on his symptoms and EKG and enzymes.  2. Dyslipidemia.  We will continue his Crestor and recheck a lipid for      file.  He is also on niacin and we need to clarify the dose.  We      will continue them.  3. Hypertension. Extremely well controlled.  We will continue his      medications and adjust them as needed.      Lorain Childes, MD  Electronically Signed     CGF/MEDQ  D:  09/11/2006  T:  09/12/2006  Job:  [id number]

## 2010-09-30 NOTE — Assessment & Plan Note (Signed)
[facility]                            CARDIOLOGY OFFICE NOTE   Kevin Webb, Kevin Webb                       MRN:          [id number]  DATE:12/23/2007                            DOB:          [birth year]    PRIMARY CARE PHYSICIAN:  Titus Dubin. Alwyn Ren, MD, FACP, FCCP   REASON FOR PRESENTATION:  The patient with coronary artery disease.   HISTORY OF PRESENT ILLNESS:  The patient presents for followup of the  above.  [age] years old, and he just had a birthday.  On the day he  turned 61, he climbed [facility] without any problems.  No  shortness of breath or chest pain.  I told him to lose 20 pounds at the  last visit and he actually did it!  He has stopped drinking beer.  He is  drinking only 1 shot of whisky at night.   PAST MEDICAL HISTORY:  Coronary artery disease status post CABG in 2000  (see March 31, 2007 note for details), dyslipidemia, hypertension,  obesity, and vasectomy.   ALLERGIES:  TYLOX caused rash.   MEDICATIONS:  1. Niaspan 1000 mg daily.  2. Ramipril 10 mg daily.  3. Aciphex 20 mg daily.  4. Zetia 10 mg nightly.  5. Crestor 20 mg daily.  6. Metoprolol 25 mg b.i.d.  7. Aspirin 81 mg daily.  8. Fish oil.  9. Vitamin E.  10.Calcium.  11.Coenzyme Q.  12.Folic acid.  13.Selenium.   REVIEW OF SYSTEMS:  As stated in the HPI and otherwise negative for  other systems.   PHYSICAL EXAMINATION:  GENERAL:  The patient is in no distress.  VITAL SIGNS:  Blood pressure 167/95 (He did not take his medicines  today), heart rate 16 and regular.  NECK:  No jugular venous distention at 45 degrees, carotid upstroke  brisk and symmetric, no bruits, no thyromegaly.  LYMPHATICS:  No adenopathy.  LUNGS:  Clear to auscultation bilaterally.  BACK:  No costovertebral angle tenderness.  CHEST:  Unremarkable.  HEART:  PMI not displaced or sustained; S1 and S2 within normal limits;  no S3, no S4; no clicks, no rubs, no murmurs.  ABDOMEN:   Mildly obese;  positive bowel sounds; normal in frequency and pitch; no bruits,  rebound, guarding, or midline pulsatile mass; no hepatomegaly.  SKIN:  No rashes, no nodules.  EXTREMITIES:  Pulse 2+, no edema.   EKG, sinus rhythm, rate 60, axis within normal limits, intervals within  normal limits, RSR prime V1-V2, and no acute ST-T wave changes.   ASSESSMENT AND PLAN:  1. Coronary artery disease.  The patient is doing very well with      respect to this.  No further cardiovascular testing is suggested.  2. Obesity.  I am very proud of him for losing weight.  He plans on      losing more.  3. Hypertension.  Blood pressure is slightly elevated today, but he      did not take his medicines.  At home it is in the 120s/80s, which      is very acceptable.  4. Dyslipidemia.  He is having a lipid profile done today.  I will      review this and make sure Dr. Alwyn Ren gets a copy.  Goal would be an      LDL less than 100 and HDL greater than 40.  5. Followup.  I will see the patient back in 12 months or sooner if      needed.      Rollene Rotunda, MD, [facility]  Electronically Signed    JH/MedQ  DD: 12/23/2007  DT: 12/23/2007  Job #: [id number]   cc:   Titus Dubin. Alwyn Ren, MD,FACP,FCCP

## 2010-10-03 NOTE — Assessment & Plan Note (Signed)
[facility]                            CARDIOLOGY OFFICE NOTE   Kevin Webb, Kevin Webb                       MRN:          [id number]  DATE:07/05/2006                            DOB:          12-03-1945    PRIMARY CARE PHYSICIAN:  Titus Dubin. Alwyn Ren, MD,FACP,FCCP   REASON FOR PRESENTATION:  Evaluate patient with coronary disease.   HISTORY OF PRESENT ILLNESS:  The patient is a 65 year old gentleman with  a past history of coronary disease as described below.  He has done well  in the past year.  He has been continuing to have problems with his foot  which limits his activity, although he can do such things as hike  through [facility] without any chest discomfort.  He is not having any  chest pressure, neck discomfort or arm discomfort.  He is not having any  palpitations, presyncope or syncope.  He is having no PND or orthopnea.  Unfortunately, he has not lost any weight, though he says he is watching  his diet.   PAST MEDICAL HISTORY:  1. Coronary artery disease status post coronary artery bypass graft in      December of 2000 (LIMA to the LAD, SVG to intermediate, SVG      sequential to acute marginal and distal right coronary artery).  2. Dyslipidemia.  3. Hypertension.  4. Vasectomy.   ALLERGIES:  TYLOX causes a rash.   MEDICATIONS:  1. Aspirin 81 mg daily.  2. AcipHex 20 mg daily.  3. Folic acid 1 mg daily.  4. Altace 10 mg daily.  5. Crestor 20 mg daily.  6. Coenzyme Q.  7. Zetia 10 mg daily.  8. Niaspan 2000 mg q.h.s.  9. Lopressor 25 mg b.i.d.   REVIEW OF SYSTEMS:  As stated in the HPI and otherwise negative for  other systems.   PHYSICAL EXAMINATION:  GENERAL:  The patient is in no distress.  VITAL SIGNS:  Weight 207 pounds, body mass index of 30.  Blood pressure  132/102, heart rate 57 and regular.  HEENT:  Eyes unremarkable.  Pupils are equal, round and reactive to  light.  Fundi not visualized.  Oral mucosa unremarkable.  NECK:  No jugular venous distention at 45 degrees.  Carotid upstroke  brisk and symmetric.  No bruits or thyromegaly.  LYMPHATICS:  No cervical, axillary or inguinal adenopathy.  LUNGS:  Clear to auscultation bilaterally.  BACK:  No costovertebral angle tenderness.  CHEST:  Unremarkable.  HEART:  PMI not displaced or sustained.  S1 and S2 within normal limits.  No S3, no S4, no clicks, no rubs, no murmurs.  ABDOMEN:  Obese.  Positive bowel sounds.  Normal in frequency and pitch.  No bruits or rebound.  No guarding.  No midline pulse.  No masses,  hepatomegaly or splenomegaly.  SKIN:  No rashes.  No nausea.  EXTREMITIES:  There were 2+ pulses throughout.  No edema, no cyanosis or  clubbing.  NEUROLOGIC:  Oriented to person, place, and time.  Cranial nerves II-XII  grossly intact.  Motor grossly intact.   ELECTROCARDIOGRAM:  Sinus  rhythm.  Axis within normal limits.  Intervals  within normal limits.  No acute ST-T wave changes.   ASSESSMENT AND PLAN:  1. Coronary disease.  The patient is having no new symptoms.  No      further cardiovascular testing is suggested; however, we continue      to address aggressive second risk reduction.  2. Dyslipidemia.  I had changed his Niaspan in November.  He was      called and given instructions to come get blood work done, but he      did not do this.  He will be put back into our schedule so that he      can have a resting lipid profile and liver enzymes.  He understands      the importance of this with his combination therapy.  3. Obesity.  We again had a discussion about the need to lose weight      with diet and exercise.  The issue is his exercise is limited.  We      are going to encourage continued diet with calorie restriction, and      hopefully he will get his heel repaired and be able to exercise.      He understands the importance of this towards his overall health.   FOLLOWUP:  I will see him yearly or sooner if needed.      Rollene Rotunda, MD, [facility]  Electronically Signed    JH/MedQ  DD: 07/05/2006  DT: 07/05/2006  Job #: [id number]   cc:   Titus Dubin. Alwyn Ren, MD,FACP,FCCP

## 2010-10-03 NOTE — Cardiovascular Report (Signed)
. [name]  Patient:    Kevin Webb                          MRN: [id number] Proc. Date: 05/09/99 Adm. Date:  [id number] Attending:  Ronaldo Miyamoto CC:         Titus Dubin. Alwyn Ren, M.D. [facility]                        Cardiac Catheterization  REASON FOR PRESENTATION:  Evaluate patient with increasing exertional angina and a Cardiolite demonstrating multivessel perfusion defects.  PROCEDURAL NOTE:  Left heart catheterization was performed via the right femoral artery.  The artery was cannulated using an anterior wall puncture.  A 6-French  arterial sheath was inserted via the Seldinger technique.  Preformed Judkins and a pigtail catheter were utilized.  The patient tolerated the procedure well and left the lab in stable condition.  HEMODYNAMIC DATA:  LV 105/8; AO 105/75.  Coronaries: 1. The left main was normal. 2. The LAD was occluded at the ostium and filled via right-to-left collaterals.    It seemed to be a large vessel with a large diagonal after the occlusion. 3. The circumflex included a very large ramus intermedius with a proximal 99%    stenosis.  There was a mid obtuse marginal with luminal irregularities. 4. The right coronary artery was occluded proximally after a very large RV branch.    This RV branch sent collaterals to septal perforators filling the LV.  A left ventriculogram was obtained in the RAO and LAO projections.  The EF was 5% with mild anterior hypokinesis.  CONCLUSIONS:  Severe three-vessel coronary artery disease with well preserved left ventricular function.  PLAN:  The patient will have CABG. DD:  05/09/99 TD:  05/11/99 Job: [id number]

## 2010-10-03 NOTE — Op Note (Signed)
[facility]  Patient:    ENCARNACION SCIONEAUX                          MRN: [id number] Proc. Date: 05/13/99 Adm. Date:  [id number] Attending:  Waldo Laine CC:         Rollene Rotunda, M.D. [facility]                           Operative Report  PREOPERATIVE DIAGNOSIS:  Coronary occlusive disease with new onset of angina and positive stress test.  POSTOPERATIVE DIAGNOSIS:  Coronary occlusive disease with new onset of angina and positive stress test.  SURGICAL PROCEDURE:  Coronary artery bypass grafting x 4 with left internal mammary to the left anterior descending coronary artery, reverse saphenous vein graft to the intermediate coronary artery, reverse saphenous vein graft sequentially to he acute margin, and distal right coronary artery.  SURGEON:  Gwenith Daily. Tyrone Sage, M.D.  FIRST ASSISTANT:  Sherrie George, P.A.  ANESTHESIA:  BRIEF HISTORY:  The patient is a 65 year old male who presents with new onset of angina symptoms.  Stress test was positive.  The patient is admitted and underwent cardiac catheterization by Rollene Rotunda, M.D. [facility].  This revealed significant  three vessel disease with total occlusion of the LAD with some slight collateral filling from the right ventricular branch.  A circumflex which was without significant disease and a moderate size intermediate with a greater than 95% stenosis.  In addition, the right coronary artery after the takeoff of the large ventricular branch was totally occluded with faint distal collateral filling. Overall ventricular function was preserved.  Because of the patients symptoms, three vessel coronary artery disease and positive stress test, coronary artery bypass grafting was recommended to the patient who agreed and signed informed consent.  DESCRIPTION OF PROCEDURE:  With Swan-Ganz and arterial line monitors in place, he patient underwent general endotracheal anesthesia without  incident.  The skin of the chest and leg was prepped with Betadine and draped in the usual sterile manner. A vein was harvested from the right lower extremity and was of excellent quality. A median sternotomy was performed.  The left internal mammary artery was dissected down as a pedicle graft.  The distal artery was divided and had good free flow.  The pericardium was opened.  Overall ventricular function appeared preserved. he patient was systemically heparinized.  The ascending aorta and the right atrium  were cannulated, and aortic root bent cardioplegia needle was introduced into the ascending aorta.  The patient was placed on cardiopulmonary bypass at 2.4 L per  minute per sq m.  Sites of anastomoses were selected and dissected out the epicardium.   The patients body temperature was cooled to 28 degrees.  An aortic cross clamp was applied and 500 cc of cold blood potassium cardioplegia was administered with rapid diastolic arrest of the heart.  Myocardial septal temperature was monitored throughout the cross clamp period.  Attention was turned first to the intermediate coronary artery which was intramyocardial.  This vessel was opened and admitted a 1.5 mm probe.  Using a running 7-0 Prolene, distal anastomosis was performed.  Additional cold blood cardioplegia was administered down to the vein graft.  Attention was then turned to the acute marginal branch of the right coronary artery which arose an acute margin and moved transversely across the inferior surface f the heart to the vicinity of the  posterior descending.  This vessel was of moderate size and of good quality.  The distal right coronary artery was also opened. It was thickened, but admitted a 1.5 mm probe distally.  Using a diamond-type side-to-side anastomosis was carried out with an acute marginal branch, and the  distal extent of the same vein was then anastomosed to the distal right coronary artery  with a running 7-0 Prolene.  Additional cold blood cardioplegia was administered down the vein graft.  Attention was then turned to the left anterior descending coronary artery. This vessel was opened in the mid third of the LAD, between the proximal and the mid  third of the LAD.  The distal two-thirds of the LAD were intramyocardial and were not readily obvious on the surface of the heart.  The vessel was opened and admitted a 1.5 mm probe proximally and distally.  Using a running 8-0 Prolene, he left internal mammary artery was anastomosed to the left anterior descending coronary artery with release of the average bulldog on the mammary artery. There was appropriate rise in myocardial and septal temperature.  Aortic cross clamp as removed.  Total cross clamp time was 52 minutes.  The patient required electrical defibrillation to return to a sinus rhythm.  A partial occlusion clamp was placed on the ascending aorta.  Two punch aortotomies were performed.  Each of the two  vein grafts were anastomosed to the ascending aorta.  Air was evacuated from the grafts and partial occlusion clamp was removed.  Sites of anastomoses were inspected and were free of bleeding.  The patient was  then ventilated and weaned from cardiopulmonary bypass without difficulty.  He remained hemodynamically stable.  He was decannulated in the usual fashion. Protamine sulfate was administered with the operative field hemostatic.  Two atrial and two ventricular pacing wires were applied.  Graft markers applied.  A left pleural tube and two mediastinal tubes were left in place.  The sternum was closed with #6 stainless steel wire.  The fascia was closed with interrupted 0 Vicryl,  running 3-0 Vicryl in the subcutaneous tissue, and a 4-0 subcuticular stitch in the skin edges.  Dry dressings were applied.  The sponge and needle count was reported as correct at completion of the procedure.  The patient  tolerated the procedure without obvious complication, and was transferred to the surgical intensive care unit for further postoperative care.  Cross clamp time was 52 minutes.  Pump time of 104 minutes.  The patient did not require any blood bank blood products during the operative procedure. DD:  05/13/99 TD:  05/14/99 Job: [id number]

## 2010-10-03 NOTE — H&P (Signed)
[place]. [facility]  Patient:    Kevin Webb, Kevin Webb Visit Number: [id number] MRN: [id number]          Service Type: MED Location: [phone] Attending Physician:  Cathren Laine Dictated by:   Madolyn Frieze. Jens Som, M.D. [facility] Admit Date:  04/29/2001                           History and Physical  HISTORY OF PRESENT ILLNESS:  Mr. Barron Alvine is a very pleasant 65 year old gentleman with a past medical history of coronary artery disease status post coronary artery bypassing graft who presents for evaluation of chest pain and dyspnea.  The patient underwent coronary artery bypassing graft on May 13, 1999.  At that time he had a LIMA to the LAD, saphenous vein graft to the intermediate, saphenous vein graft sequentially to the acute marginal as well as the distal right coronary artery.  His left ventricular function has been preserved.  He had done well since then.  Back in October the patient states that he developed a URI which took him six to eight weeks to completely get over.  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  Since that time, he notes significant increase in fatigue as well as some dyspnea on exertion.  There is no dyspnea at rest and no orthopnea or PND, and no pedal edema.  He also complained of pain in the right calf area on Sunday.  It should be noted that he did travel to the [employer] in November.  There has been no hemoptysis or fevers or chills.  He did see Gene Serpe in the office on April 27, 2001 for these issues.  At that time it was felt that he needed a stress Cardiolite.  However, the patient went to work this a.m. and was concerned about his symptoms.  He also developed a chest tightness that he has attributed to stress previously.  There was no associated shortness of breath, nausea, vomiting, or diaphoresis.  The pain was not pleuritic.  He therefore presented to the emergency room.  Of note, his symptoms prior to  his bypass were described as a throat tightness with exertion that resolved with rest.  He has had none of these symptoms.  PRESENT MEDICATIONS: 1. Lipitor 20 mg p.o. q.d. 2. Aspirin 81 mg p.o. q.d. 3. Aciphex 20 mg p.o. q.d. 4. Folate. 5. Toprol-XL 50 mg p.o. q.d. 6. Altace 7.5 mg p.o. q.d.  For details of his past medical history, social history, family history, and review of systems, please refer to the note handwritten by Gene Serpe.  PHYSICAL EXAMINATION TODAY:  VITAL SIGNS:  He is afebrile at 98.2.  Blood pressure 165/86, pulse 64. Respiratory rate 20.  GENERAL:  He is well-developed and well-nourished, no acute distress.  SKIN:  Warm and dry.  HEENT:  Unremarkable with normal eyelids.  NECK:  Supple with a normal upstroke bilaterally and there are no bruits noted.  There is no jugular venous distention noted and no thyromegaly noted.  CHEST:  Clear to auscultation with normal expansion.  CARDIOVASCULAR:  Regular rate and rhythm with a normal S1 and S2.  There are no murmurs, rubs, or gallops noted.  ABDOMEN:  Not tender or distended, positive bowel sounds.  No hepatosplenomegaly and no masses appreciated.  There is no abdominal bruit noted.  He has 2+ femoral pulses bilaterally and no bruits.  EXTREMITIES:  Show no  edema.  He has 2+ posterior tibial pulses bilaterally. I can palpate no cords, in particular on the right calf area.  NEUROLOGIC:  Grossly intact.  LABORATORY DATA:  His electrocardiogram today shows a normal sinus rhythm at a rate of 60.  There are no acute ST changes.  Other laboratories are pending.  DIAGNOSES: 1. Atypical chest pain. 2. Dyspnea. 3. History of coronary artery disease status post coronary artery bypassing    graft. 4. Hyperlipidemia. 5. Hypertension. 6. History of hiatal hernia.  PLAN:  Mr. Barron Alvine presents for evaluation of new onset of dyspnea on exertion as well as chest tightness.  He did travel to the [employer]  recently and did complain of some pain in the right calf area this past Sunday.  I therefore feel compelled to rule out pulmonary embolus and we will proceed with a spiral CT.  He also complained of chest tightness this morning.  However, he states that he has had this with stress before.  It is not similar to his symptoms prior to his coronary artery bypassing graft.  His electrocardiogram is normal.  We will therefore continue with his aspirin as well as his Toprol-XL and Altace.  We will rule out myocardial infarction with serial enzymes.  If they are negative and his EKG is unchanged in the morning, then we will proceed with a stress Cardiolite for risk stratification.  If it is negative then I think he can be discharged with outpatient follow-up with Dr. Antoine Poche. If it is positive then we will proceed with cardiac catheterization. Dictated by:   Madolyn Frieze. Jens Som, M.D. [facility] Attending Physician:  Cathren Laine DD:  04/29/01 TD:  04/29/01 Job: [id number]

## 2010-10-03 NOTE — Discharge Summary (Signed)
[place]. [facility]  Patient:    Kevin Webb, Kevin Webb Visit Number: [id number] MRN: [id number]          Service Type: MED Location: [phone] Attending Physician:  Junious Silk Dictated by:   Rozell Searing, P.A. Admit Date:  04/29/2001 Disc. Date: 04/30/01   CC:         Titus Dubin. Alwyn Ren, M.D. [facility]   Referring Physician Discharge [place]  REASON FOR ADMISSION:  Mr. Butler is a pleasant 66 year old male status post CABG December 2000 followed by Dr. Antoine Poche who presented to the emergency room for evaluation of recent exertional dyspnea and intermittent chest pain. Of note, he had just been seen in the office two days prior and I referred him for an outpatient stress Cardiolite test.  Please refer to dictated admission note for full details.  LABORATORY DATA:  Cardiac enzymes:  CPK/MB and troponin I negative x 3. Normal metabolic profile, marginally elevated glucose 114.  D-dimer 0.25. Normal CBC.  HOSPITAL COURSE:  Patient ruled out for myocardial infarction with negative serial cardiac enzymes.  Patient underwent evaluation for both pulmonary embolus as possible cause of his dyspnea, as well as unstable angina pectoris.  Patient did give a history of a recent long trip to the [facility].  Both a d-dimer and a spiral CT of the chest were negative.  Patient then underwent an exercise stress Cardiolite test on morning of discharge.  He exercised a total of 12 minutes 20 seconds on standard Bruce protocol achieving 94% of PMHR with 14.2 METS.  No chest pain was elicited during the procedure, no significant ST abnormalities noted, and perfusion images revealed no evidence of ischemia/scar; EF 63%.  Patient was cleared for discharge.  Medications were adjusted by Dr. Antoine Poche at discharge with up titration of Altace from 7.5 to 10 mg q.d.  DISCHARGE MEDICATIONS: 1. Altace 10 mg q.d. 2. Lipitor 20 mg q.d. 3. Aspirin 81 mg q.d. 4.  Aciphex 20 mg q.d. 5. Toprol-XL 50 mg q.d. 6. Folic acid.  INSTRUCTIONS:  ACTIVITY:  Return to baseline level of activity as tolerated. FOLLOW-UP:  Patient is instructed to call and schedule a follow-up appointment with Dr. Antoine Poche in approximately two weeks.  DISCHARGE DIAGNOSES: 1. Exertional dyspnea/chest pain.    a. Negative evaluation for pulmonary embolus.    b. Negative adequate stress Cardiolite. 2. Coronary artery disease.    Four-vessel coronary artery bypass grafting in December 2000. 3. Recent upper respiratory infection. 4. Hypertension. 5. Dislipidemia. Dictated by:   Rozell Searing, P.A. Attending Physician:  Junious Silk DD:  04/30/01 TD:  04/30/01 Job: [id number]

## 2010-11-28 ENCOUNTER — Encounter: Payer: Self-pay | Admitting: Cardiology

## 2010-11-28 ENCOUNTER — Other Ambulatory Visit: Payer: BC Managed Care – PPO | Admitting: *Deleted

## 2010-11-28 ENCOUNTER — Ambulatory Visit (INDEPENDENT_AMBULATORY_CARE_PROVIDER_SITE_OTHER): Payer: BC Managed Care – PPO | Admitting: Cardiology

## 2010-11-28 DIAGNOSIS — E785 Hyperlipidemia, unspecified: Secondary | ICD-10-CM

## 2010-11-28 DIAGNOSIS — I25810 Atherosclerosis of coronary artery bypass graft(s) without angina pectoris: Secondary | ICD-10-CM

## 2010-11-28 DIAGNOSIS — I10 Essential (primary) hypertension: Secondary | ICD-10-CM

## 2010-11-28 DIAGNOSIS — E663 Overweight: Secondary | ICD-10-CM | POA: Insufficient documentation

## 2010-11-28 NOTE — Assessment & Plan Note (Signed)
His blood pressure at home is well within target.  I will not change his meds at this visit.

## 2010-11-28 NOTE — Assessment & Plan Note (Signed)
He will come back next week for a fasting lipid profile.

## 2010-11-28 NOTE — Assessment & Plan Note (Signed)
The patient has no new sypmtoms.  No further cardiovascular testing is indicated.  We will continue with aggressive risk reduction and meds as listed.  

## 2010-11-28 NOTE — Progress Notes (Signed)
HPI The patient presents for evaluation after a recent office visit in which he described some chest discomfort. Followup stress perfusion study demonstrated a normal EF and no ischemia. He is no longer having any complaints. He works very vigorously at a very active job. The patient denies any new symptoms such as chest discomfort, neck or arm discomfort. There has been no new shortness of breath, PND or orthopnea. There have been no reported palpitations, presyncope or syncope.   Allergies  Allergen Reactions  . Oxycodone-Acetaminophen     REACTION: RASH  . Tylox     Current Outpatient Prescriptions  Medication Sig Dispense Refill  . Ascorbic Acid (VITAMIN C) 1000 MG tablet Take 1,000 mg by mouth daily.        Marland Kitchen aspirin 325 MG tablet Take 325 mg by mouth daily.        . Cholecalciferol (VITAMIN D3) 1000 UNITS CAPS 1 tab po qd       . clopidogrel (PLAVIX) 75 MG tablet Take 75 mg by mouth daily.        Marland Kitchen co-enzyme Q-10 30 MG capsule Take 30 mg by mouth daily.       . metoprolol tartrate (LOPRESSOR) 25 MG tablet Take 25 mg by mouth 2 (two) times daily. 1/2 tab bid       . Multiple Vitamin (MULTIVITAMIN) tablet Take 1 tablet by mouth daily.        . niacin (NIASPAN) 1000 MG CR tablet Take 1,000 mg by mouth at bedtime.        . nitroGLYCERIN (NITROSTAT) 0.4 MG SL tablet Place 0.4 mg under the tongue every 5 (five) minutes as needed.        . Omega-3 Fatty Acids (FISH OIL) 1000 MG CAPS 1 tab po qd       . RABEprazole (ACIPHEX) 20 MG tablet Take 20 mg by mouth daily.        . ramipril (ALTACE) 10 MG capsule Take 10 mg by mouth daily.        . rosuvastatin (CRESTOR) 20 MG tablet Take 20 mg by mouth daily.        . sildenafil (VIAGRA) 100 MG tablet Take 100 mg by mouth daily as needed.        . vitamin E 100 UNIT capsule Take 100 Units by mouth daily.          Past Medical History  Diagnosis Date  . CAD (coronary artery disease)     a. s/p CABG 12/00;  b. s/p BMS to S-RI 3/12;   c. cath  3/12: L-LAD ok, S-RI 95% tx with BMS, S-AM/RCA ok, EF 60%  . HTN (hypertension)   . GERD (gastroesophageal reflux disease)   . Hyperlipidemia   . Hyperglycemia   . Vitamin D deficiency   . Colonic polyp   . Peyronie disease   . Low testosterone   . Sinus bradycardia     Past Surgical History  Procedure Date  . Colonoscopy w/ polypectomy   . Coronary artery bypass graft 2001  . Vasectomy     ROS:  As stated in the HPI and negative for all other systems.  PHYSICAL EXAM BP 143/98  Pulse 60  Resp 16  Ht 5\' 9"  (1.753 m)  Wt 209 lb (94.802 kg)  BMI 30.86 kg/m2 GENERAL:  Well appearing HEENT:  Pupils equal round and reactive, fundi not visualized, oral mucosa unremarkable NECK:  No jugular venous distention, waveform within normal limits, carotid upstroke brisk  and symmetric, no bruits, no thyromegaly LYMPHATICS:  No cervical, inguinal adenopathy LUNGS:  Clear to auscultation bilaterally BACK:  No CVA tenderness CHEST:  Well healed sternotomy scar. HEART:  PMI not displaced or sustained,S1 and S2 within normal limits, no S3, no S4, no clicks, no rubs, no murmurs ABD:  Flat, positive bowel sounds normal in frequency in pitch, no bruits, no rebound, no guarding, no midline pulsatile mass, no hepatomegaly, no splenomegaly EXT:  2 plus pulses throughout, no edema, no cyanosis no clubbing SKIN:  No rashes no nodules NEURO:  Cranial nerves II through XII grossly intact, motor grossly intact throughout PSYCH:  Cognitively intact, oriented to person place and time  ASSESSMENT AND PLAN

## 2010-11-28 NOTE — Assessment & Plan Note (Signed)
He has lost six pounds on his scale and is aiming for 185.

## 2010-11-28 NOTE — Patient Instructions (Signed)
Please continue all medications as listed  Please return for a fasting lipid panel.  Follow up in 1 year with Dr Antoine Poche.  You will receive a letter in the mail 2 months before you are due.  Please call us when you receive this letter to schedule your follow up appointment.

## 2010-12-01 ENCOUNTER — Telehealth: Payer: Self-pay | Admitting: Internal Medicine

## 2010-12-01 MED ORDER — RABEPRAZOLE SODIUM 20 MG PO TBEC: 20.0000 mg | DELAYED_RELEASE_TABLET | Freq: Every day | ORAL | Status: DC

## 2010-12-01 NOTE — Telephone Encounter (Addendum)
Heart Dr. Vanita Ingles cholesterol, I will send request for Crestor to Dr.Hochrein

## 2010-12-02 NOTE — Telephone Encounter (Signed)
Pt is supposed to come into office for a fasting lipid profile.  He will need to do this prior to [place] refilling his Crestor as the RX may change based on the results

## 2010-12-03 ENCOUNTER — Other Ambulatory Visit: Payer: Self-pay | Admitting: Cardiology

## 2010-12-03 ENCOUNTER — Other Ambulatory Visit (INDEPENDENT_AMBULATORY_CARE_PROVIDER_SITE_OTHER): Payer: BC Managed Care – PPO | Admitting: *Deleted

## 2010-12-03 DIAGNOSIS — E785 Hyperlipidemia, unspecified: Secondary | ICD-10-CM

## 2010-12-03 LAB — LIPID PANEL
Cholesterol: 209 mg/dL — ABNORMAL HIGH (ref 0–200)
HDL: 53.7 mg/dL (ref >39.00)
Triglycerides: 229 mg/dL — ABNORMAL HIGH (ref 0.0–149.0)

## 2010-12-24 ENCOUNTER — Encounter: Payer: Self-pay | Admitting: *Deleted

## 2010-12-24 ENCOUNTER — Other Ambulatory Visit: Payer: Self-pay | Admitting: *Deleted

## 2010-12-24 DIAGNOSIS — E785 Hyperlipidemia, unspecified: Secondary | ICD-10-CM

## 2010-12-24 MED ORDER — ROSUVASTATIN CALCIUM 40 MG PO TABS: 40.0000 mg | ORAL_TABLET | Freq: Every day | ORAL | Status: DC

## 2010-12-24 NOTE — Telephone Encounter (Signed)
RN s/w Pt re: lipid panel results. Dr Antoine Poche recommends to increase Crestor to 40 Mg daily and recheck labs in 8 weeks. Rx sent to [facility]. Pt requests for results to be faxed to [phone]. Pt would like to have labs drawn at our [place] office. [employer] number and address provided. Pt verbalizes understanding.  RN attempted to eRx, unable to; called Pharmacy [phone] s/w Asher Muir. Verbal read back confirmed.

## 2010-12-24 NOTE — Telephone Encounter (Signed)
Addended by: Sharmon Revere A on: 12/24/2010 03:23 PM   Modules accepted: Orders

## 2010-12-29 ENCOUNTER — Other Ambulatory Visit: Payer: Self-pay | Admitting: Internal Medicine

## 2010-12-29 MED ORDER — RAMIPRIL 10 MG PO CAPS: 10.0000 mg | ORAL_CAPSULE | Freq: Every day | ORAL | Status: DC

## 2010-12-29 NOTE — Telephone Encounter (Signed)
RX sent to pharmacy  

## 2011-01-22 ENCOUNTER — Other Ambulatory Visit: Payer: Self-pay | Admitting: *Deleted

## 2011-01-22 MED ORDER — CLOPIDOGREL BISULFATE 75 MG PO TABS: 75.0000 mg | ORAL_TABLET | Freq: Every day | ORAL | Status: DC

## 2011-01-22 MED ORDER — NIACIN ER (ANTIHYPERLIPIDEMIC) 1000 MG PO TBCR: 1000.0000 mg | EXTENDED_RELEASE_TABLET | Freq: Every day | ORAL | Status: DC

## 2011-01-27 ENCOUNTER — Telehealth: Payer: Self-pay | Admitting: Cardiology

## 2011-01-27 NOTE — Telephone Encounter (Signed)
Spoke with pt - will come in for a fasting lipid and liver profile as ordered.

## 2011-01-27 NOTE — Telephone Encounter (Signed)
Left message to call back  

## 2011-01-27 NOTE — Telephone Encounter (Signed)
Pt calling as suggested by his pharmacy. Pt could not be specific as to who he needed to talk to and what he needed to talk to them about. Please return pt call to discuss further.

## 2011-01-28 ENCOUNTER — Other Ambulatory Visit (INDEPENDENT_AMBULATORY_CARE_PROVIDER_SITE_OTHER): Payer: BC Managed Care – PPO | Admitting: *Deleted

## 2011-01-28 DIAGNOSIS — E785 Hyperlipidemia, unspecified: Secondary | ICD-10-CM

## 2011-01-28 LAB — LIPID PANEL
Cholesterol: 165 mg/dL (ref 0–200)
Total CHOL/HDL Ratio: 3
VLDL: 40.2 mg/dL — ABNORMAL HIGH (ref 0.0–40.0)

## 2011-01-28 LAB — HEPATIC FUNCTION PANEL
Bilirubin, Direct: 0 mg/dL (ref 0.0–0.3)
Total Bilirubin: 0.9 mg/dL (ref 0.3–1.2)
Total Protein: 7.1 g/dL (ref 6.0–8.3)

## 2011-02-01 ENCOUNTER — Encounter: Payer: Self-pay | Admitting: Cardiology

## 2011-02-23 ENCOUNTER — Other Ambulatory Visit: Payer: BC Managed Care – PPO | Admitting: *Deleted

## 2011-03-09 ENCOUNTER — Telehealth: Payer: Self-pay | Admitting: Cardiology

## 2011-03-09 DIAGNOSIS — N529 Male erectile dysfunction, unspecified: Secondary | ICD-10-CM

## 2011-03-09 NOTE — Telephone Encounter (Signed)
Pt needs refill on Viagra 100mg  called [facility] in [place] [phone]

## 2011-03-10 MED ORDER — SILDENAFIL CITRATE 100 MG PO TABS: 100.0000 mg | ORAL_TABLET | Freq: Every day | ORAL | Status: DC | PRN

## 2011-03-19 ENCOUNTER — Other Ambulatory Visit: Payer: Self-pay | Admitting: Internal Medicine

## 2011-03-20 NOTE — Telephone Encounter (Signed)
Valacyclovir request [removed from med list in [facility] 07/26/2009 Last OV 04/09/2010].

## 2011-03-20 NOTE — Telephone Encounter (Signed)
Please verify with patient the need for this medication as it was previously removed from his active med list

## 2011-03-31 NOTE — Telephone Encounter (Signed)
Spoke with patient, asked the need for med, patient responded that he just takes this med daily

## 2011-04-01 NOTE — Telephone Encounter (Signed)
The dose for the first episode is 1 g twice a day for 10 days. For recurrent outbreaks the doses 500 mg twice a day for 3 days. The chronic suppressive dose is 1 g daily. If there are  less than 9 recurrences per year 500 mg daily would suffice. The reason this is requested needs to be provided to assess optimal prescription

## 2011-04-01 NOTE — Telephone Encounter (Signed)
Per Dr.Hopper patient needs to be specific as to why he is taking med and who prescribed it at that dose for 1000mg  bid is a lot  I called patient and he stated he is taking med for Herpes and he needs a rx for the standard dose recommended. Patient states Dr.Hopper started him on this med and may be pharmacy made a mistake on the dosage requested.   Dr.Hopper please advise on dose patient should be on for herpes treatment

## 2011-04-02 MED ORDER — VALACYCLOVIR HCL 500 MG PO TABS: 500.0000 mg | ORAL_TABLET | Freq: Every day | ORAL | Status: DC

## 2011-04-02 NOTE — Telephone Encounter (Signed)
Rx sent for 500mg 

## 2011-05-26 ENCOUNTER — Encounter: Payer: BC Managed Care – PPO | Admitting: Internal Medicine

## 2011-05-26 DIAGNOSIS — Z0289 Encounter for other administrative examinations: Secondary | ICD-10-CM

## 2011-06-08 ENCOUNTER — Ambulatory Visit: Payer: Self-pay

## 2011-06-08 LAB — RAPID STREP-A WITH REFLX: Micro Text Report: NEGATIVE

## 2011-07-13 ENCOUNTER — Telehealth: Payer: Self-pay | Admitting: Internal Medicine

## 2011-07-13 MED ORDER — RABEPRAZOLE SODIUM 20 MG PO TBEC: 20.0000 mg | DELAYED_RELEASE_TABLET | Freq: Every day | ORAL | Status: DC

## 2011-07-13 NOTE — Telephone Encounter (Signed)
Refill: Aciphex 20mg  tab. Take 1 tablet each day. Qty 90. Last fill 06-08-11

## 2011-07-13 NOTE — Telephone Encounter (Signed)
Dr.Hopper please advise, last filled 11/2010, last OV 05/26/11 (NO SHOWED), previous OV 04/09/2010 (Acute)

## 2011-07-13 NOTE — Telephone Encounter (Signed)
#  30, no refill. Office visit needed prior to additional  refills

## 2011-07-14 ENCOUNTER — Other Ambulatory Visit: Payer: Self-pay | Admitting: *Deleted

## 2011-07-14 DIAGNOSIS — E785 Hyperlipidemia, unspecified: Secondary | ICD-10-CM

## 2011-07-14 MED ORDER — ROSUVASTATIN CALCIUM 40 MG PO TABS: 40.0000 mg | ORAL_TABLET | Freq: Every day | ORAL | Status: DC

## 2011-07-28 ENCOUNTER — Telehealth: Payer: Self-pay | Admitting: *Deleted

## 2011-07-28 MED ORDER — CLOPIDOGREL BISULFATE 75 MG PO TABS: 75.0000 mg | ORAL_TABLET | Freq: Every day | ORAL | Status: DC

## 2011-07-28 NOTE — Telephone Encounter (Signed)
rx request not telephone call... error

## 2011-07-30 ENCOUNTER — Telehealth: Payer: Self-pay | Admitting: Internal Medicine

## 2011-07-30 MED ORDER — RABEPRAZOLE SODIUM 20 MG PO TBEC: 20.0000 mg | DELAYED_RELEASE_TABLET | Freq: Every day | ORAL | Status: DC

## 2011-07-30 NOTE — Telephone Encounter (Signed)
Kevin Webb drug states  That have not received the refill send 07-13-11 for  Aciphex 20MG  Tab  Qty 90 Take 1-tablet each day   Can you please re-send

## 2011-07-30 NOTE — Telephone Encounter (Signed)
I reviewed history and RX was sent to [facility]. I sent rx to requested pharmacy [facility]

## 2011-08-10 ENCOUNTER — Other Ambulatory Visit: Payer: Self-pay | Admitting: *Deleted

## 2011-08-10 MED ORDER — METOPROLOL TARTRATE 25 MG PO TABS: 12.5000 mg | ORAL_TABLET | Freq: Two times a day (BID) | ORAL | Status: DC

## 2011-08-11 ENCOUNTER — Other Ambulatory Visit: Payer: Self-pay

## 2011-08-11 DIAGNOSIS — E785 Hyperlipidemia, unspecified: Secondary | ICD-10-CM

## 2011-08-11 MED ORDER — ROSUVASTATIN CALCIUM 40 MG PO TABS: 40.0000 mg | ORAL_TABLET | Freq: Every day | ORAL | Status: DC

## 2011-08-11 NOTE — Telephone Encounter (Signed)
..   Requested Prescriptions   Signed Prescriptions Disp Refills  . rosuvastatin (CRESTOR) 40 MG tablet 30 tablet 6    Sig: Take 1 tablet (40 mg total) by mouth daily.    Authorizing Provider: HOCHREIN, JAMES    Ordering User: Tolbert Matheson M   

## 2011-09-15 ENCOUNTER — Ambulatory Visit (INDEPENDENT_AMBULATORY_CARE_PROVIDER_SITE_OTHER): Payer: BC Managed Care – PPO | Admitting: Internal Medicine

## 2011-09-15 ENCOUNTER — Encounter: Payer: Self-pay | Admitting: Internal Medicine

## 2011-09-15 VITALS — BP 126/80 | HR 56 | Temp 98.0°F | Resp 12 | Ht 69.0 in | Wt 209.2 lb

## 2011-09-15 DIAGNOSIS — I10 Essential (primary) hypertension: Secondary | ICD-10-CM

## 2011-09-15 DIAGNOSIS — E559 Vitamin D deficiency, unspecified: Secondary | ICD-10-CM

## 2011-09-15 DIAGNOSIS — E785 Hyperlipidemia, unspecified: Secondary | ICD-10-CM

## 2011-09-15 DIAGNOSIS — Z0000 Encounter for general adult medical examination without abnormal findings: Secondary | ICD-10-CM

## 2011-09-15 DIAGNOSIS — R7309 Other abnormal glucose: Secondary | ICD-10-CM

## 2011-09-15 NOTE — Progress Notes (Signed)
Subjective:    Patient ID: Kevin Webb, male    DOB: 12-26-1945, 67 y.o.   MRN: [id number]  HPI  Kevin Webb is here for a physical; he denies acute issues .      Review of Systems HYPERTENSION: Disease Monitoring: Blood pressure range-126-152/68-90 Chest pain, palpitations- no       Dyspnea- no Medications: Compliance- no Lightheadedness,Syncope-no    Edema- no  FASTING HYPERGLYCEMIA, PMH of: Disease Monitoring: Blood Sugar ranges-in 80s  Polyuria/phagia/dipsia- no polyphagia; + poly dipsia & polyuria       Visual problems- cataracts Medications: Compliance- no meds; heart healthy diet  Hypoglycemic symptoms- no  HYPERLIPIDEMIA: Disease Monitoring: See symptoms for Hypertension Medications: Compliance- yes Abd pain, bowel changes-no   Muscle aches- no  His urologist checked his PSA in the Fall of 2012; it was one or less. Labs 4/12-9/12 were reviewed. Lipids are to goal except for triglycerides of 201. He is due for CBC & dif, A1c, thyroid, and BMET         Objective:   Physical Exam Gen.: Healthy and well-nourished in appearance. Alert, appropriate and cooperative throughout exam. Head: Normocephalic without obvious abnormalities;  no alopecia  Eyes: No corneal or conjunctival inflammation noted. Pupils equal round reactive to light and accommodation. Fundal exam is benign without hemorrhages, exudate, papilledema. Extraocular motion intact. Some lid lag .Vision grossly normal. Ears: External  ear exam reveals no significant lesions or deformities. Canals clear .TMs normal. Hearing is grossly normal bilaterally. Nose: External nasal exam reveals no deformity or inflammation. Nasal mucosa are pink and moist. No lesions or exudates noted.  Mouth: Oral mucosa and oropharynx reveal no lesions or exudates. Teeth in good repair. Neck: No deformities, masses, or tenderness noted. Range of motion & Thyroid normal Lungs: Normal respiratory effort; chest expands  symmetrically. Lungs are clear to auscultation without rales, wheezes, or increased work of breathing. Heart: Normal rate and rhythm. Normal S1 and S2. No gallop, click, or rub. S4 with slurring ; no  murmur. Abdomen: Bowel sounds normal; abdomen soft and nontender. No masses, organomegaly or hernias noted. Genitalia/DRE: Dr Artis Flock, Urology , [facility]                                           Musculoskeletal/extremities: No deformity or scoliosis noted of  the thoracic or lumbar spine. No clubbing, cyanosis, edema, or deformity noted. Range of motion  normal .Tone & strength  normal.Joints normal. Nail health  good. Vascular: Carotid, radial artery, dorsalis pedis and  posterior tibial pulses are full and equal. No bruits present. Neurologic: Alert and oriented x3. Deep tendon reflexes symmetrical and normal.          Skin: Intact without suspicious lesions or rashes. Lymph: No cervical, axillary lymphadenopathy present. Psych: Mood and affect are normal. Normally interactive                                                                                         Assessment & Plan:  #1 comprehensive physical exam; no  acute findings #2 see Problem List with Assessments & Recommendations Plan: see Orders

## 2011-09-15 NOTE — Patient Instructions (Signed)
Preventive Health Care: Exercise at least 30-45 minutes a day,  3-4 days a week.  The most common cause of elevated triglycerides is the ingestion of sugar from high fructose corn syrup sources added to processed foods & drinks.  Eat a low-fat diet with lots of fruits and vegetables, up to 7-9 servings per day. Consume less than30 (preferably ZERO) grams of sugar per day from foods & drinks with High Fructose Corn Syrup (HFCS) sugar as #1,2,3 or # 4 on label.Whole Foods, [employer] & [employer] do not carry products with HFCS. Follow a  low carb nutrition program such as [place] or The New Sugar Busters  to prevent Diabetes progression . White carbohydrates (potatoes, rice, bread, and pasta) have a high spike of sugar and a high load of sugar. For example a  baked potato has a cup of sugar and a  french fry  2 teaspoons of sugar. Yams, wild  rice, whole grained bread &  wheat pasta have been much lower spike and load of  sugar. Portions should be the size of a deck of cards or your palm.  Please try to go on My Chart within the next 24 hours to allow me to release the results directly to you.

## 2011-09-16 LAB — BASIC METABOLIC PANEL
CO2: 26 mEq/L (ref 19–32)
Calcium: 9.7 mg/dL (ref 8.4–10.5)
Glucose, Bld: 99 mg/dL (ref 70–99)
Potassium: 4.4 mEq/L (ref 3.5–5.1)
Sodium: 139 mEq/L (ref 135–145)

## 2011-09-16 LAB — CBC WITH DIFFERENTIAL/PLATELET
Eosinophils Relative: 0.8 % (ref 0.0–5.0)
HCT: 43.6 % (ref 39.0–52.0)
Lymphs Abs: 1.6 10*3/uL (ref 0.7–4.0)
Monocytes Relative: 8.6 % (ref 3.0–12.0)
Platelets: 144 10*3/uL — ABNORMAL LOW (ref 150.0–400.0)
RBC: 4.63 Mil/uL (ref 4.22–5.81)
WBC: 6.6 10*3/uL (ref 4.5–10.5)

## 2011-09-16 LAB — TSH: TSH: 0.94 u[IU]/mL (ref 0.35–5.50)

## 2011-10-05 ENCOUNTER — Telehealth: Payer: Self-pay | Admitting: Internal Medicine

## 2011-10-05 MED ORDER — RAMIPRIL 10 MG PO CAPS: 10.0000 mg | ORAL_CAPSULE | Freq: Every day | ORAL | Status: DC

## 2011-10-05 NOTE — Telephone Encounter (Signed)
Refill: Ramipril 10mg  cap #90. Take one capsule each day. Last fill 06-08-11

## 2011-10-05 NOTE — Telephone Encounter (Signed)
RX sent

## 2011-11-02 ENCOUNTER — Telehealth: Payer: Self-pay | Admitting: Internal Medicine

## 2011-11-02 MED ORDER — RABEPRAZOLE SODIUM 20 MG PO TBEC: 20.0000 mg | DELAYED_RELEASE_TABLET | Freq: Every day | ORAL | Status: DC

## 2011-11-02 NOTE — Telephone Encounter (Signed)
Refill: Aciphex 20mg  tab #90. Take one tablet each day. Needs to make appointment before prescription will be refilled again. Last fill 10-03-11

## 2011-11-02 NOTE — Telephone Encounter (Signed)
Rx sent 

## 2011-11-21 ENCOUNTER — Emergency Department: Payer: Self-pay | Admitting: Emergency Medicine

## 2011-12-01 ENCOUNTER — Ambulatory Visit (INDEPENDENT_AMBULATORY_CARE_PROVIDER_SITE_OTHER): Payer: BC Managed Care – PPO | Admitting: Cardiology

## 2011-12-01 ENCOUNTER — Encounter: Payer: Self-pay | Admitting: Cardiology

## 2011-12-01 VITALS — BP 139/92 | HR 59 | Ht 69.0 in | Wt 209.4 lb

## 2011-12-01 DIAGNOSIS — E785 Hyperlipidemia, unspecified: Secondary | ICD-10-CM

## 2011-12-01 DIAGNOSIS — I10 Essential (primary) hypertension: Secondary | ICD-10-CM

## 2011-12-01 DIAGNOSIS — I25810 Atherosclerosis of coronary artery bypass graft(s) without angina pectoris: Secondary | ICD-10-CM

## 2011-12-01 DIAGNOSIS — E663 Overweight: Secondary | ICD-10-CM

## 2011-12-01 NOTE — Progress Notes (Signed)
HPI The patient presents for evaluation of CAD.  Since I last saw him he has done well.  The patient denies any new symptoms such as chest discomfort, neck or arm discomfort. There has been no new shortness of breath, PND or orthopnea. There have been no reported palpitations, presyncope or syncope.  He remains active.   Allergies  Allergen Reactions  . Oxycodone-Acetaminophen     REACTION: RASH    Current Outpatient Prescriptions  Medication Sig Dispense Refill  . Ascorbic Acid (VITAMIN C) 1000 MG tablet Take 1,000 mg by mouth daily.        Marland Kitchen aspirin 325 MG tablet Take 325 mg by mouth daily.        . Cholecalciferol (VITAMIN D3) 5000 UNITS CAPS Take by mouth daily.      . clopidogrel (PLAVIX) 75 MG tablet Take 1 tablet (75 mg total) by mouth daily.  30 tablet  6  . co-enzyme Q-10 30 MG capsule Take 30 mg by mouth daily.       . metoprolol tartrate (LOPRESSOR) 25 MG tablet Take 0.5 tablets (12.5 mg total) by mouth 2 (two) times daily.  30 tablet  6  . Multiple Vitamin (MULTIVITAMIN) tablet Take 1 tablet by mouth daily.        . niacin (NIASPAN) 1000 MG CR tablet Take 1 tablet (1,000 mg total) by mouth at bedtime.  30 tablet  10  . nitroGLYCERIN (NITROSTAT) 0.4 MG SL tablet Place 0.4 mg under the tongue every 5 (five) minutes as needed.        . Omega-3 Fatty Acids (FISH OIL) 1000 MG CAPS 1 tab po qd       . RABEprazole (ACIPHEX) 20 MG tablet Take 1 tablet (20 mg total) by mouth daily.  90 tablet  1  . ramipril (ALTACE) 10 MG capsule Take 1 capsule (10 mg total) by mouth daily.  90 capsule  1  . rosuvastatin (CRESTOR) 40 MG tablet Take 1 tablet (40 mg total) by mouth daily.  30 tablet  6  . sildenafil (VIAGRA) 100 MG tablet Take 1 tablet (100 mg total) by mouth daily as needed.  10 tablet  3  . valACYclovir (VALTREX) 500 MG tablet Take 1 tablet (500 mg total) by mouth daily.  90 tablet  2  . vitamin E 100 UNIT capsule Take 100 Units by mouth daily.          Past Medical History    Diagnosis Date  . CAD (coronary artery disease)     a.S/P CABG 12/00;  b. s/p BMS to S-RI 3/12;   c. cath 3/12: L-LAD ok, S-RI 95% tx with BMS, S-AM/RCA ok, EF 60%  . HTN (hypertension)   . GERD (gastroesophageal reflux disease)   . Hyperlipidemia   . Hyperglycemia   . Vitamin d deficiency   . Colonic polyp   . Peyronie disease   . Low testosterone   . Sinus bradycardia     Past Surgical History  Procedure Date  . Colonoscopy w/ polypectomy      X 2; Dr Russella Dar; due 2014  . Coronary artery bypass graft 04/1999    4 vessels  . Vasectomy     ROS:  As stated in the HPI and negative for all other systems.  PHYSICAL EXAM BP 139/92  Pulse 59  Ht 5\' 9"  (1.753 m)  Wt 209 lb 6.4 oz (94.983 kg)  BMI 30.92 kg/m2 GENERAL:  Well appearing NECK:  No jugular venous  distention, waveform within normal limits, carotid upstroke brisk and symmetric, no bruits, no thyromegaly LUNGS:  Clear to auscultation bilaterally BACK:  No CVA tenderness CHEST:  Well healed sternotomy scar. HEART:  PMI not displaced or sustained,S1 and S2 within normal limits, no S3, no S4, no clicks, no rubs, no murmurs ABD:  Flat, positive bowel sounds normal in frequency in pitch, no bruits, no rebound, no guarding, no midline pulsatile mass, no hepatomegaly, no splenomegaly EXT:  2 plus pulses throughout, no edema, no cyanosis no clubbing   ASSESSMENT AND PLAN  Coronary atherosclerosis of artery bypass graft -  The patient has no new sypmtoms. No further cardiovascular testing is indicated. We will continue with aggressive risk reduction and meds as listed.   HYPERLIPIDEMIA -  He will come back next week for a fasting lipid profile in Sept with a goal LDL less than 100.   HYPERTENSION -  His blood pressure at home is well within target. I will not change his meds at this visit.  Overweight -  He watches his diet.  His weight has been stable.  We discussed diet and exercise.

## 2011-12-01 NOTE — Patient Instructions (Addendum)
The current medical regimen is effective;  continue present plan and medications.  Please have your fasting cholesterol and hepatic panel drawn in September 2013  Follow up in 1 year with Dr Antoine Poche.  You will receive a letter in the mail 2 months before you are due.  Please call us when you receive this letter to schedule your follow up appointment.

## 2012-02-15 ENCOUNTER — Telehealth: Payer: Self-pay | Admitting: Cardiology

## 2012-02-15 ENCOUNTER — Ambulatory Visit (INDEPENDENT_AMBULATORY_CARE_PROVIDER_SITE_OTHER): Payer: BC Managed Care – PPO | Admitting: *Deleted

## 2012-02-15 DIAGNOSIS — E785 Hyperlipidemia, unspecified: Secondary | ICD-10-CM

## 2012-02-15 DIAGNOSIS — Z0000 Encounter for general adult medical examination without abnormal findings: Secondary | ICD-10-CM

## 2012-02-15 LAB — LIPID PANEL
HDL: 47.1 mg/dL (ref >39.00)
Total CHOL/HDL Ratio: 4
Triglycerides: 304 mg/dL — ABNORMAL HIGH (ref 0.0–149.0)
VLDL: 60.8 mg/dL — ABNORMAL HIGH (ref 0.0–40.0)

## 2012-02-15 LAB — HEPATIC FUNCTION PANEL
ALT: 22 U/L (ref 0–53)
Albumin: 4.3 g/dL (ref 3.5–5.2)
Total Protein: 7.2 g/dL (ref 6.0–8.3)

## 2012-02-15 LAB — LDL CHOLESTEROL, DIRECT: Direct LDL: 108.4 mg/dL

## 2012-02-15 NOTE — Telephone Encounter (Signed)
New problem:  Patient would like PSA added to lab work .

## 2012-02-18 ENCOUNTER — Other Ambulatory Visit: Payer: Self-pay

## 2012-02-18 DIAGNOSIS — E785 Hyperlipidemia, unspecified: Secondary | ICD-10-CM

## 2012-02-18 MED ORDER — ROSUVASTATIN CALCIUM 40 MG PO TABS
40.0000 mg | ORAL_TABLET | Freq: Every day | ORAL | Status: DC
Start: 2012-02-18 — End: 2013-02-28

## 2012-02-18 MED ORDER — NIACIN ER (ANTIHYPERLIPIDEMIC) 1000 MG PO TBCR: 1000.0000 mg | EXTENDED_RELEASE_TABLET | Freq: Every day | ORAL | Status: DC

## 2012-02-18 NOTE — Telephone Encounter (Signed)
..   Requested Prescriptions   Signed Prescriptions Disp Refills  . rosuvastatin (CRESTOR) 40 MG tablet 30 tablet 10    Sig: Take 1 tablet (40 mg total) by mouth daily.    Authorizing Provider: Rollene Rotunda    Ordering User: Christella Hartigan, Sung Parodi Judie Petit

## 2012-02-18 NOTE — Telephone Encounter (Signed)
..   Requested Prescriptions   Signed Prescriptions Disp Refills  . niacin (NIASPAN) 1000 MG CR tablet 30 tablet 10    Sig: Take 1 tablet (1,000 mg total) by mouth at bedtime.    Authorizing Provider: Rollene Rotunda    Ordering User: Christella Hartigan, Jamie Hafford Judie Petit

## 2012-02-23 ENCOUNTER — Encounter: Payer: Self-pay | Admitting: *Deleted

## 2012-03-21 ENCOUNTER — Other Ambulatory Visit: Payer: Self-pay

## 2012-03-21 MED ORDER — CLOPIDOGREL BISULFATE 75 MG PO TABS: 75.0000 mg | ORAL_TABLET | Freq: Every day | ORAL | Status: DC

## 2012-03-21 NOTE — Telephone Encounter (Signed)
..   Requested Prescriptions   Signed Prescriptions Disp Refills  . clopidogrel (PLAVIX) 75 MG tablet 30 tablet 6    Sig: Take 1 tablet (75 mg total) by mouth daily.    Authorizing Provider: Rollene Rotunda    Ordering User: Christella Hartigan, Tahnee Cifuentes Judie Petit

## 2012-04-18 ENCOUNTER — Other Ambulatory Visit: Payer: Self-pay | Admitting: Internal Medicine

## 2012-04-18 MED ORDER — RAMIPRIL 10 MG PO CAPS: 10.0000 mg | ORAL_CAPSULE | Freq: Every day | ORAL | Status: DC

## 2012-04-18 NOTE — Telephone Encounter (Signed)
RX sent

## 2012-04-18 NOTE — Telephone Encounter (Signed)
refill Ramipril (Cap) 10 MG Take 1 capsule (10 mg total) by mouth daily #90 last fill 8.8.13 last ov 4.30.13 V70

## 2012-05-02 ENCOUNTER — Telehealth: Payer: Self-pay | Admitting: Internal Medicine

## 2012-05-02 MED ORDER — RABEPRAZOLE SODIUM 20 MG PO TBEC: 20.0000 mg | DELAYED_RELEASE_TABLET | Freq: Every day | ORAL | Status: DC

## 2012-05-02 NOTE — Telephone Encounter (Signed)
Refill: Aciphex 20 mg tab. Take 1 tablet each day. Qty 90. Last fill 01-20-12

## 2012-06-10 ENCOUNTER — Other Ambulatory Visit: Payer: Self-pay

## 2012-06-10 MED ORDER — METOPROLOL TARTRATE 25 MG PO TABS: 12.5000 mg | ORAL_TABLET | Freq: Two times a day (BID) | ORAL | Status: DC

## 2012-06-10 NOTE — Telephone Encounter (Signed)
Refill sent for metoprolol.  

## 2012-06-12 ENCOUNTER — Other Ambulatory Visit: Payer: Self-pay

## 2012-06-12 ENCOUNTER — Emergency Department: Payer: Self-pay | Admitting: Unknown Physician Specialty

## 2012-06-12 LAB — COMPREHENSIVE METABOLIC PANEL
BUN: 12 mg/dL (ref 7–18)
Bilirubin,Total: 0.4 mg/dL (ref 0.2–1.0)
Calcium, Total: 8.4 mg/dL — ABNORMAL LOW (ref 8.5–10.1)
Co2: 21 mmol/L (ref 21–32)
Creatinine: 1.01 mg/dL (ref 0.60–1.30)
EGFR (African American): >60
EGFR (Non-African Amer.): >60
Glucose: 128 mg/dL — ABNORMAL HIGH (ref 65–99)
Osmolality: 279 (ref 275–301)
Potassium: 3.8 mmol/L (ref 3.5–5.1)
SGOT(AST): 24 U/L (ref 15–37)
SGPT (ALT): 25 U/L (ref 12–78)

## 2012-06-12 LAB — CBC
HCT: 41.3 % (ref 40.0–52.0)
HGB: 13.9 g/dL (ref 13.0–18.0)
Platelet: 134 10*3/uL — ABNORMAL LOW (ref 150–440)
RBC: 4.47 10*6/uL (ref 4.40–5.90)

## 2012-06-12 LAB — PROTIME-INR: Prothrombin Time: 13.1 secs (ref 11.5–14.7)

## 2012-06-12 LAB — CK TOTAL AND CKMB (NOT AT ARMC): CK-MB: 0.9 ng/mL (ref 0.5–3.6)

## 2012-06-12 LAB — APTT: Activated PTT: 27.6 secs (ref 23.6–35.9)

## 2012-06-12 LAB — TROPONIN I: Troponin-I: <0.02 ng/mL

## 2012-06-13 ENCOUNTER — Telehealth: Payer: Self-pay | Admitting: Cardiology

## 2012-06-13 ENCOUNTER — Telehealth: Payer: Self-pay | Admitting: Internal Medicine

## 2012-06-13 NOTE — Telephone Encounter (Signed)
Pt has chest pain , went [facility] [place] Sunday am, told to call today, not experiencing any problem today

## 2012-06-13 NOTE — Telephone Encounter (Signed)
#  25 , R X5

## 2012-06-13 NOTE — Telephone Encounter (Signed)
woke with discomfort in chest from sleep Sunday am about 2.  Didn't have any SL ntg with him.  BP was elevated - called EMS  HR 60 BP still elevated but no EKG changes.  Used SL in ambulance and BP went down and CP went away with it also.  All labs normal all testing normal.  Has had no problems yesterday or today - normal BP and HR and no chest pain.  Pain was at old incision site - was about a 5 or 6.  Didn't feel like the pain was cardiac felt more like GI.  Need to get lab results from [place].

## 2012-06-13 NOTE — Telephone Encounter (Signed)
Hopp please advise on the dispense number, I do not show that we fill rx before

## 2012-06-13 NOTE — Telephone Encounter (Signed)
Refill: Nitrostat 0.4mg  sl tab #25. One under tongue as needed for chest pain. Last fill 11-25-09

## 2012-06-14 MED ORDER — NITROGLYCERIN 0.4 MG SL SUBL: 0.4000 mg | SUBLINGUAL_TABLET | SUBLINGUAL | Status: DC | PRN

## 2012-06-15 NOTE — Telephone Encounter (Signed)
Records obtained and given to Dr Antoine Poche for review

## 2012-06-21 NOTE — Telephone Encounter (Signed)
Left message that records were obtained and reviewed.  No changes are needed at this time.  Requested he call back if questions or needs

## 2012-07-02 ENCOUNTER — Other Ambulatory Visit: Payer: Self-pay

## 2012-10-05 ENCOUNTER — Other Ambulatory Visit: Payer: Self-pay | Admitting: *Deleted

## 2012-10-05 MED ORDER — CLOPIDOGREL BISULFATE 75 MG PO TABS: 75.0000 mg | ORAL_TABLET | Freq: Every day | ORAL | Status: DC

## 2012-10-26 ENCOUNTER — Other Ambulatory Visit: Payer: Self-pay | Admitting: *Deleted

## 2012-10-26 MED ORDER — RAMIPRIL 10 MG PO CAPS: 10.0000 mg | ORAL_CAPSULE | Freq: Every day | ORAL | Status: DC

## 2012-10-26 NOTE — Telephone Encounter (Signed)
Rx sent Pt will pending OV 10-31-12

## 2012-10-31 ENCOUNTER — Ambulatory Visit (INDEPENDENT_AMBULATORY_CARE_PROVIDER_SITE_OTHER): Payer: BC Managed Care – PPO | Admitting: Internal Medicine

## 2012-10-31 ENCOUNTER — Encounter: Payer: Self-pay | Admitting: Internal Medicine

## 2012-10-31 VITALS — BP 116/78 | HR 65 | Temp 98.2°F | Resp 14 | Ht 68.08 in | Wt 209.0 lb

## 2012-10-31 DIAGNOSIS — Z0000 Encounter for general adult medical examination without abnormal findings: Secondary | ICD-10-CM

## 2012-10-31 LAB — LIPID PANEL
Cholesterol: 197 mg/dL (ref 0–200)
Total CHOL/HDL Ratio: 4
Triglycerides: 255 mg/dL — ABNORMAL HIGH (ref 0.0–149.0)
VLDL: 51 mg/dL — ABNORMAL HIGH (ref 0.0–40.0)

## 2012-10-31 LAB — CBC WITH DIFFERENTIAL/PLATELET
Basophils Absolute: 0 10*3/uL (ref 0.0–0.1)
Eosinophils Relative: 0.8 % (ref 0.0–5.0)
Monocytes Relative: 8.4 % (ref 3.0–12.0)
Neutrophils Relative %: 66.3 % (ref 43.0–77.0)
Platelets: 178 10*3/uL (ref 150.0–400.0)
RDW: 13.7 % (ref 11.5–14.6)
WBC: 7.1 10*3/uL (ref 4.5–10.5)

## 2012-10-31 LAB — LDL CHOLESTEROL, DIRECT: Direct LDL: 121.9 mg/dL

## 2012-10-31 LAB — BASIC METABOLIC PANEL
BUN: 10 mg/dL (ref 6–23)
Calcium: 9.3 mg/dL (ref 8.4–10.5)
Creatinine, Ser: 1 mg/dL (ref 0.4–1.5)
GFR: 76.21 mL/min (ref >60.00)
Glucose, Bld: 103 mg/dL — ABNORMAL HIGH (ref 70–99)

## 2012-10-31 LAB — HEPATIC FUNCTION PANEL
ALT: 21 U/L (ref 0–53)
AST: 28 U/L (ref 0–37)
Bilirubin, Direct: 0.2 mg/dL (ref 0.0–0.3)
Total Bilirubin: 1.2 mg/dL (ref 0.3–1.2)

## 2012-10-31 LAB — TSH: TSH: 1.19 u[IU]/mL (ref 0.35–5.50)

## 2012-10-31 MED ORDER — CLONAZEPAM 0.5 MG PO TABS: ORAL_TABLET | ORAL | Status: DC

## 2012-10-31 NOTE — Patient Instructions (Addendum)
Because of your past history of colon polyps; colonoscopy would be due this year. Dr. Russella Dar and Dr. Abelardo Diesel will need to discuss holding the Plavix. Please perform isometric exercises before going to bed. Sit on side of the bed and raise up on toes to a count of 5. Then put pressure on the heels to a count of 5. Repeat this process 10 times. This will improve blood flow to the calves & help prevent cramps.

## 2012-10-31 NOTE — Progress Notes (Signed)
  Subjective:    Patient ID: Kevin Webb, male    DOB: [birth year], 67 y.o.   MRN: [id number]  HPI  He is here for a physical;acute issues include restless leg symptoms after a long work day.     Review of Systems He is on a heart healthy diet; he exercises 120 minutes as biking or walking per week without symptoms. Specifically he denies chest pain, palpitations, dyspnea, or claudication. Family history is positive for premature coronary disease in his father @ 21. Because of CAD his LDL goal is < 70.     Objective:   Physical Exam Gen.:  well-nourished in appearance. Alert, appropriate and cooperative throughout exam.  Head: Normocephalic without obvious abnormalities  Eyes: No corneal or conjunctival inflammation noted. Some lid lag. Extraocular motion intact. Vision grossly normal with lenses Ears: External  ear exam reveals no significant lesions or deformities. Canals clear .TMs normal. Hearing is grossly normal bilaterally. Nose: External nasal exam reveals no deformity or inflammation. Nasal mucosa are pink and moist. No lesions or exudates noted.  Mouth: Oral mucosa and oropharynx reveal no lesions or exudates. Teeth in good repair. Neck: No deformities, masses, or tenderness noted. Range of motion & Thyroid normal. Lungs: Normal respiratory effort; chest expands symmetrically. Lungs are clear to auscultation without rales, wheezes, or increased work of breathing. Heart: Normal rate and rhythm. Normal S1 and S2. No gallop, click, or rub.No murmur. Abdomen: Bowel sounds normal; abdomen soft and nontender. No masses, organomegaly or hernias noted. Genitalia: Vasectomy scar tissue; L varicoele.Minimal R prostate asymmetry with induration.                          Musculoskeletal/extremities: No deformity or scoliosis noted of  the thoracic or lumbar spine.  No clubbing, cyanosis, edema, or significant extremity  deformity noted. Range of motion normal .Tone & strength  Normal. Joints  normal . Nail health good. Able to lie down & sit up w/o help. Negative SLR bilaterally Vascular: Carotid, radial artery, dorsalis pedis and  posterior tibial pulses are full and equal. No bruits present. Neurologic: Alert and oriented x3. Deep tendon reflexes symmetrical and normal.       Skin: Intact without suspicious lesions or rashes. Lymph: No cervical, axillary, or inguinal lymphadenopathy present. Psych: Mood and affect are normal. Normally interactive                                                                                        Assessment & Plan:  #1 comprehensive physical exam; no acute findings  #2 RLS  Plan: see Orders  & Recommendations

## 2012-11-02 ENCOUNTER — Ambulatory Visit: Payer: BC Managed Care – PPO

## 2012-11-02 DIAGNOSIS — R7309 Other abnormal glucose: Secondary | ICD-10-CM

## 2012-11-02 LAB — HEMOGLOBIN A1C: Hgb A1c MFr Bld: 6.1 % (ref 4.6–6.5)

## 2012-12-27 ENCOUNTER — Other Ambulatory Visit: Payer: Self-pay | Admitting: *Deleted

## 2012-12-27 MED ORDER — RABEPRAZOLE SODIUM 20 MG PO TBEC: 20.0000 mg | DELAYED_RELEASE_TABLET | Freq: Every day | ORAL | Status: DC

## 2012-12-27 NOTE — Telephone Encounter (Signed)
Rx has been filled for  Rabeprazole aciphex 20 mg.  AG cma

## 2013-01-17 ENCOUNTER — Encounter: Payer: Self-pay | Admitting: Cardiology

## 2013-01-17 ENCOUNTER — Ambulatory Visit (INDEPENDENT_AMBULATORY_CARE_PROVIDER_SITE_OTHER): Payer: Medicare Other | Admitting: Cardiology

## 2013-01-17 VITALS — BP 136/86 | HR 65 | Ht 68.0 in | Wt 211.0 lb

## 2013-01-17 DIAGNOSIS — I10 Essential (primary) hypertension: Secondary | ICD-10-CM

## 2013-01-17 DIAGNOSIS — I25810 Atherosclerosis of coronary artery bypass graft(s) without angina pectoris: Secondary | ICD-10-CM | POA: Diagnosis not present

## 2013-01-17 NOTE — Patient Instructions (Addendum)
The current medical regimen is effective;  continue present plan and medications.  Follow up in 1 year with Dr Hochrein.  You will receive a letter in the mail 2 months before you are due.  Please call us when you receive this letter to schedule your follow up appointment.  

## 2013-01-17 NOTE — Progress Notes (Signed)
HPI The patient presents for evaluation of CAD.  Since I last saw him he has done well.  The patient denies any new symptoms such as chest discomfort, neck or arm discomfort. There has been no new shortness of breath, PND or orthopnea. There have been no reported palpitations, presyncope or syncope.  He remains active.   Allergies  Allergen Reactions  . Oxycodone-Acetaminophen     REACTION: RASH    Current Outpatient Prescriptions  Medication Sig Dispense Refill  . Ascorbic Acid (VITAMIN C) 1000 MG tablet Take 1,000 mg by mouth daily.        Marland Kitchen aspirin 325 MG tablet Take 325 mg by mouth daily.        . Cholecalciferol (VITAMIN D3) 5000 UNITS CAPS Take by mouth daily.      . clonazePAM (KLONOPIN) 0.5 MG tablet 1 qhs prn for restless legs  30 tablet  1  . clopidogrel (PLAVIX) 75 MG tablet Take 1 tablet (75 mg total) by mouth daily.  30 tablet  2  . co-enzyme Q-10 30 MG capsule Take 30 mg by mouth daily.       . metoprolol tartrate (LOPRESSOR) 25 MG tablet Take 0.5 tablets (12.5 mg total) by mouth 2 (two) times daily.  30 tablet  6  . Multiple Vitamin (MULTIVITAMIN) tablet Take 1 tablet by mouth daily.        . niacin (NIASPAN) 1000 MG CR tablet Take 1 tablet (1,000 mg total) by mouth at bedtime.  30 tablet  10  . nitroGLYCERIN (NITROSTAT) 0.4 MG SL tablet Place 1 tablet (0.4 mg total) under the tongue every 5 (five) minutes as needed.  25 tablet  5  . Omega-3 Fatty Acids (FISH OIL) 1000 MG CAPS 1 tab po qd       . RABEprazole (ACIPHEX) 20 MG tablet Take 1 tablet (20 mg total) by mouth daily.  90 tablet  1  . ramipril (ALTACE) 10 MG capsule Take 1 capsule (10 mg total) by mouth daily.  90 capsule  0  . rosuvastatin (CRESTOR) 40 MG tablet Take 1 tablet (40 mg total) by mouth daily.  30 tablet  10  . sildenafil (VIAGRA) 100 MG tablet Take 1 tablet (100 mg total) by mouth daily as needed.  10 tablet  3  . valACYclovir (VALTREX) 500 MG tablet Take 1 tablet (500 mg total) by mouth daily.  90  tablet  2  . vitamin E 100 UNIT capsule Take 100 Units by mouth daily.         No current facility-administered medications for this visit.    Past Medical History  Diagnosis Date  . CAD (coronary artery disease)     a.S/P CABG 12/00;  b. s/p BMS to S-RI 3/12;   c. cath 3/12: L-LAD ok, S-RI 95% tx with BMS, S-AM/RCA ok, EF 60%  . HTN (hypertension)   . GERD (gastroesophageal reflux disease)   . Hyperlipidemia   . Hyperglycemia   . Vitamin D deficiency   . Colonic polyp     X2  . Peyronie disease   . Low testosterone     Dr Evelene Croon, [place] ,[place]  . Sinus bradycardia     Past Surgical History  Procedure Laterality Date  . Colonoscopy w/ polypectomy  2004 & 2009     X 2; Dr Russella Dar; due 2014  . Coronary artery bypass graft  04/1999    4 vessels  . Vasectomy    . Coronary stent placement  2012    Plavix    ROS:  As stated in the HPI and negative for all other systems.  PHYSICAL EXAM BP 136/86  Pulse 65  Ht 5\' 8"  (1.727 m)  Wt 211 lb (95.709 kg)  BMI 32.09 kg/m2 GENERAL:  Well appearing NECK:  No jugular venous distention, waveform within normal limits, carotid upstroke brisk and symmetric, no bruits, no thyromegaly LUNGS:  Clear to auscultation bilaterally BACK:  No CVA tenderness CHEST:  Well healed sternotomy scar. HEART:  PMI not displaced or sustained,S1 and S2 within normal limits, no S3, no S4, no clicks, no rubs, no murmurs ABD:  Flat, positive bowel sounds normal in frequency in pitch, no bruits, no rebound, no guarding, no midline pulsatile mass, no hepatomegaly, no splenomegaly EXT:  2 plus pulses throughout, no edema, no cyanosis no clubbing   ASSESSMENT AND PLAN  Coronary atherosclerosis of artery bypass graft -  The patient has no new sypmtoms. No further cardiovascular testing is indicated. We will continue with aggressive risk reduction and meds as listed.   HYPERLIPIDEMIA -  He will come back next week for a fasting lipid profile in Sept with a goal  LDL less than 100.   HYPERTENSION -  His blood pressure at home is well within target. I will not change his meds at this visit.  Overweight -  He watches his diet.  His weight has been stable.  We discussed diet and exercise.

## 2013-01-17 NOTE — Progress Notes (Signed)
HPI The patient presents for evaluation of CAD.  Since I last saw him he has done well.  He did report one ER visit and [facility] in the beginning of the year but I couldn't review these records. He had some chest discomfort but it was ultimately thought to be GI. Otherwise he's been active. He did some hiking with a backpack this summer.  With this he denies any new symptoms such as chest discomfort, neck or arm discomfort. There has been no new shortness of breath, PND or orthopnea. There have been no reported palpitations, presyncope or syncope.  He remains active working at vigorous job..   Allergies  Allergen Reactions  . Oxycodone-Acetaminophen     REACTION: RASH    Current Outpatient Prescriptions  Medication Sig Dispense Refill  . Ascorbic Acid (VITAMIN C) 1000 MG tablet Take 1,000 mg by mouth daily.        Marland Kitchen aspirin 325 MG tablet Take 325 mg by mouth daily.        . Cholecalciferol (VITAMIN D3) 5000 UNITS CAPS Take by mouth daily.      . clonazePAM (KLONOPIN) 0.5 MG tablet 1 qhs prn for restless legs  30 tablet  1  . clopidogrel (PLAVIX) 75 MG tablet Take 1 tablet (75 mg total) by mouth daily.  30 tablet  2  . co-enzyme Q-10 30 MG capsule Take 30 mg by mouth daily.       . metoprolol tartrate (LOPRESSOR) 25 MG tablet Take 0.5 tablets (12.5 mg total) by mouth 2 (two) times daily.  30 tablet  6  . Multiple Vitamin (MULTIVITAMIN) tablet Take 1 tablet by mouth daily.        . niacin (NIASPAN) 1000 MG CR tablet Take 1 tablet (1,000 mg total) by mouth at bedtime.  30 tablet  10  . nitroGLYCERIN (NITROSTAT) 0.4 MG SL tablet Place 1 tablet (0.4 mg total) under the tongue every 5 (five) minutes as needed.  25 tablet  5  . Omega-3 Fatty Acids (FISH OIL) 1000 MG CAPS 1 tab po qd       . RABEprazole (ACIPHEX) 20 MG tablet Take 1 tablet (20 mg total) by mouth daily.  90 tablet  1  . ramipril (ALTACE) 10 MG capsule Take 1 capsule (10 mg total) by mouth daily.  90 capsule  0  . rosuvastatin  (CRESTOR) 40 MG tablet Take 1 tablet (40 mg total) by mouth daily.  30 tablet  10  . sildenafil (VIAGRA) 100 MG tablet Take 1 tablet (100 mg total) by mouth daily as needed.  10 tablet  3  . valACYclovir (VALTREX) 500 MG tablet Take 1 tablet (500 mg total) by mouth daily.  90 tablet  2  . vitamin E 100 UNIT capsule Take 100 Units by mouth daily.         No current facility-administered medications for this visit.    Past Medical History  Diagnosis Date  . CAD (coronary artery disease)     a.S/P CABG 12/00;  b. s/p BMS to S-RI 3/12;   c. cath 3/12: L-LAD ok, S-RI 95% tx with BMS, S-AM/RCA ok, EF 60%  . HTN (hypertension)   . GERD (gastroesophageal reflux disease)   . Hyperlipidemia   . Hyperglycemia   . Vitamin D deficiency   . Colonic polyp     X2  . Peyronie disease   . Low testosterone     Dr Evelene Croon, [place] ,[place]  . Sinus bradycardia  Past Surgical History  Procedure Laterality Date  . Colonoscopy w/ polypectomy  2004 & 2009     X 2; Dr Russella Dar; due 2014  . Coronary artery bypass graft  04/1999    4 vessels  . Vasectomy    . Coronary stent placement  2012    Plavix    ROS:  As stated in the HPI and negative for all other systems.  PHYSICAL EXAM BP 136/86  Pulse 65  Ht 5\' 8"  (1.727 m)  Wt 211 lb (95.709 kg)  BMI 32.09 kg/m2 GENERAL:  Well appearing NECK:  No jugular venous distention, waveform within normal limits, carotid upstroke brisk and symmetric, no bruits, no thyromegaly LUNGS:  Clear to auscultation bilaterally BACK:  No CVA tenderness CHEST:  Well healed sternotomy scar. HEART:  PMI not displaced or sustained,S1 and S2 within normal limits, no S3, no S4, no clicks, no rubs, no murmurs ABD:  Flat, positive bowel sounds normal in frequency in pitch, no bruits, no rebound, no guarding, no midline pulsatile mass, no hepatomegaly, no splenomegaly EXT:  2 plus pulses throughout, no edema, no cyanosis no clubbing  EKG:  Sinus rhythm, rate 65, right bundle  branch block, axis within normal limits, intervals within normal limits, no acute ST-T wave changes.  01/17/2013  ASSESSMENT AND PLAN  Coronary atherosclerosis of artery bypass graft -  The patient has no new sypmtoms. No further cardiovascular testing is indicated. We will continue with aggressive risk reduction and meds as listed.  I did review the catheterization from 2012. 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  HYPERLIPIDEMIA -  I reviewed his lipids and his LDL is still 121. However, he is on target dose Crestor at 40 mg and he will continue this.  HYPERTENSION -  His blood pressure at home is well within target. I will not change his meds at this visit.  Overweight -  He watches his diet.  His weight has been stable.

## 2013-01-23 ENCOUNTER — Telehealth: Payer: Self-pay | Admitting: *Deleted

## 2013-01-23 ENCOUNTER — Other Ambulatory Visit: Payer: Self-pay | Admitting: *Deleted

## 2013-01-23 MED ORDER — RAMIPRIL 10 MG PO CAPS: 10.0000 mg | ORAL_CAPSULE | Freq: Every day | ORAL | Status: DC

## 2013-01-23 NOTE — Telephone Encounter (Signed)
Prior Authorization process was started on 01/23/2013 waiting on reply.  Ag cma

## 2013-01-23 NOTE — Telephone Encounter (Signed)
Rx refilled for ramipril 10 mg.  Ag cma

## 2013-01-24 ENCOUNTER — Other Ambulatory Visit: Payer: Self-pay | Admitting: *Deleted

## 2013-01-24 MED ORDER — NIACIN ER (ANTIHYPERLIPIDEMIC) 1000 MG PO TBCR: 1000.0000 mg | EXTENDED_RELEASE_TABLET | Freq: Every day | ORAL | Status: DC

## 2013-01-24 MED ORDER — CLOPIDOGREL BISULFATE 75 MG PO TABS: 75.0000 mg | ORAL_TABLET | Freq: Every day | ORAL | Status: DC

## 2013-01-25 ENCOUNTER — Telehealth: Payer: Self-pay | Admitting: *Deleted

## 2013-01-25 NOTE — Telephone Encounter (Signed)
Prior Auth was started on 01/23/13 for rabeprazole and it was denied because this medication is not on patients formlary.  Ag cma

## 2013-02-07 NOTE — Telephone Encounter (Signed)
Recommend Nexium 20 mg OTC  daily as trial as required by his insurance company unless they will cover Nexium 40 as Rx

## 2013-02-07 NOTE — Telephone Encounter (Signed)
Spoke with insurance. Pt has to first try and fail two of  The medications listed below:   Nexium, Omeprazole, pantoprazole.

## 2013-02-28 ENCOUNTER — Other Ambulatory Visit: Payer: Self-pay

## 2013-02-28 DIAGNOSIS — E785 Hyperlipidemia, unspecified: Secondary | ICD-10-CM

## 2013-02-28 MED ORDER — ROSUVASTATIN CALCIUM 40 MG PO TABS: 40.0000 mg | ORAL_TABLET | Freq: Every day | ORAL | Status: DC

## 2013-03-14 ENCOUNTER — Other Ambulatory Visit: Payer: Self-pay

## 2013-03-14 DIAGNOSIS — N4889 Other specified disorders of penis: Secondary | ICD-10-CM | POA: Diagnosis not present

## 2013-03-14 DIAGNOSIS — N400 Enlarged prostate without lower urinary tract symptoms: Secondary | ICD-10-CM | POA: Diagnosis not present

## 2013-03-14 DIAGNOSIS — N401 Enlarged prostate with lower urinary tract symptoms: Secondary | ICD-10-CM | POA: Diagnosis not present

## 2013-03-14 DIAGNOSIS — N529 Male erectile dysfunction, unspecified: Secondary | ICD-10-CM | POA: Diagnosis not present

## 2013-03-14 MED ORDER — METOPROLOL TARTRATE 25 MG PO TABS: 12.5000 mg | ORAL_TABLET | Freq: Two times a day (BID) | ORAL | Status: DC

## 2013-03-16 DIAGNOSIS — N400 Enlarged prostate without lower urinary tract symptoms: Secondary | ICD-10-CM | POA: Diagnosis not present

## 2013-03-16 DIAGNOSIS — N401 Enlarged prostate with lower urinary tract symptoms: Secondary | ICD-10-CM | POA: Diagnosis not present

## 2013-03-16 DIAGNOSIS — N529 Male erectile dysfunction, unspecified: Secondary | ICD-10-CM | POA: Diagnosis not present

## 2013-03-23 ENCOUNTER — Other Ambulatory Visit: Payer: Self-pay

## 2013-04-11 DIAGNOSIS — N401 Enlarged prostate with lower urinary tract symptoms: Secondary | ICD-10-CM | POA: Diagnosis not present

## 2013-04-11 DIAGNOSIS — E291 Testicular hypofunction: Secondary | ICD-10-CM | POA: Diagnosis not present

## 2013-04-11 DIAGNOSIS — N529 Male erectile dysfunction, unspecified: Secondary | ICD-10-CM | POA: Diagnosis not present

## 2013-04-18 ENCOUNTER — Emergency Department (HOSPITAL_COMMUNITY): Payer: Medicare Other

## 2013-04-18 ENCOUNTER — Other Ambulatory Visit: Payer: Self-pay

## 2013-04-18 ENCOUNTER — Inpatient Hospital Stay (HOSPITAL_COMMUNITY): Payer: Medicare Other

## 2013-04-18 ENCOUNTER — Inpatient Hospital Stay (HOSPITAL_COMMUNITY)
Admission: EM | Admit: 2013-04-18 | Discharge: 2013-04-22 | DRG: 418 | Disposition: A | Discharge status: Home / Self Care | Payer: Medicare Other | Attending: Internal Medicine | Admitting: Internal Medicine

## 2013-04-18 ENCOUNTER — Encounter (HOSPITAL_COMMUNITY): Payer: Self-pay | Admitting: Emergency Medicine

## 2013-04-18 DIAGNOSIS — R1010 Upper abdominal pain, unspecified: Secondary | ICD-10-CM

## 2013-04-18 DIAGNOSIS — I10 Essential (primary) hypertension: Secondary | ICD-10-CM | POA: Diagnosis not present

## 2013-04-18 DIAGNOSIS — K8043 Calculus of bile duct with acute cholecystitis with obstruction: Principal | ICD-10-CM | POA: Diagnosis present

## 2013-04-18 DIAGNOSIS — Z8719 Personal history of other diseases of the digestive system: Secondary | ICD-10-CM | POA: Diagnosis present

## 2013-04-18 DIAGNOSIS — I4510 Unspecified right bundle-branch block: Secondary | ICD-10-CM | POA: Diagnosis present

## 2013-04-18 DIAGNOSIS — K219 Gastro-esophageal reflux disease without esophagitis: Secondary | ICD-10-CM

## 2013-04-18 DIAGNOSIS — R109 Unspecified abdominal pain: Secondary | ICD-10-CM

## 2013-04-18 DIAGNOSIS — Z9861 Coronary angioplasty status: Secondary | ICD-10-CM

## 2013-04-18 DIAGNOSIS — K810 Acute cholecystitis: Secondary | ICD-10-CM | POA: Diagnosis not present

## 2013-04-18 DIAGNOSIS — K829 Disease of gallbladder, unspecified: Secondary | ICD-10-CM

## 2013-04-18 DIAGNOSIS — K5732 Diverticulitis of large intestine without perforation or abscess without bleeding: Secondary | ICD-10-CM

## 2013-04-18 DIAGNOSIS — R079 Chest pain, unspecified: Secondary | ICD-10-CM

## 2013-04-18 DIAGNOSIS — F1010 Alcohol abuse, uncomplicated: Secondary | ICD-10-CM

## 2013-04-18 DIAGNOSIS — E663 Overweight: Secondary | ICD-10-CM

## 2013-04-18 DIAGNOSIS — E785 Hyperlipidemia, unspecified: Secondary | ICD-10-CM

## 2013-04-18 DIAGNOSIS — Z7982 Long term (current) use of aspirin: Secondary | ICD-10-CM

## 2013-04-18 DIAGNOSIS — I25810 Atherosclerosis of coronary artery bypass graft(s) without angina pectoris: Secondary | ICD-10-CM

## 2013-04-18 DIAGNOSIS — Z860100 Personal history of colon polyps, unspecified: Secondary | ICD-10-CM

## 2013-04-18 DIAGNOSIS — R1013 Epigastric pain: Secondary | ICD-10-CM | POA: Diagnosis not present

## 2013-04-18 DIAGNOSIS — R1011 Right upper quadrant pain: Secondary | ICD-10-CM | POA: Diagnosis not present

## 2013-04-18 DIAGNOSIS — R1012 Left upper quadrant pain: Secondary | ICD-10-CM | POA: Diagnosis not present

## 2013-04-18 DIAGNOSIS — Z6829 Body mass index (BMI) 29.0-29.9, adult: Secondary | ICD-10-CM

## 2013-04-18 DIAGNOSIS — E559 Vitamin D deficiency, unspecified: Secondary | ICD-10-CM

## 2013-04-18 DIAGNOSIS — R918 Other nonspecific abnormal finding of lung field: Secondary | ICD-10-CM | POA: Diagnosis not present

## 2013-04-18 DIAGNOSIS — E291 Testicular hypofunction: Secondary | ICD-10-CM

## 2013-04-18 DIAGNOSIS — I701 Atherosclerosis of renal artery: Secondary | ICD-10-CM | POA: Diagnosis not present

## 2013-04-18 DIAGNOSIS — R112 Nausea with vomiting, unspecified: Secondary | ICD-10-CM | POA: Diagnosis not present

## 2013-04-18 DIAGNOSIS — R7309 Other abnormal glucose: Secondary | ICD-10-CM

## 2013-04-18 DIAGNOSIS — R072 Precordial pain: Secondary | ICD-10-CM | POA: Diagnosis not present

## 2013-04-18 DIAGNOSIS — I498 Other specified cardiac arrhythmias: Secondary | ICD-10-CM | POA: Diagnosis present

## 2013-04-18 HISTORY — DX: Chest pain, unspecified: R07.9

## 2013-04-18 HISTORY — DX: Acute cholecystitis: K81.0

## 2013-04-18 HISTORY — DX: Diverticulitis of intestine, part unspecified, without perforation or abscess without bleeding: K57.92

## 2013-04-18 LAB — URINALYSIS, ROUTINE W REFLEX MICROSCOPIC
Glucose, UA: NEGATIVE mg/dL
Hgb urine dipstick: NEGATIVE
Ketones, ur: 15 mg/dL — AB
Protein, ur: 30 mg/dL — AB
Urobilinogen, UA: 0.2 mg/dL (ref 0.0–1.0)
pH: 5 (ref 5.0–8.0)

## 2013-04-18 LAB — URINE MICROSCOPIC-ADD ON

## 2013-04-18 LAB — COMPREHENSIVE METABOLIC PANEL
ALT: 22 U/L (ref 0–53)
Alkaline Phosphatase: 47 U/L (ref 39–117)
CO2: 25 mEq/L (ref 19–32)
Calcium: 9.1 mg/dL (ref 8.4–10.5)
Chloride: 101 mEq/L (ref 96–112)
Creatinine, Ser: 0.89 mg/dL (ref 0.50–1.35)
GFR calc Af Amer: >90 mL/min (ref >90)
GFR calc non Af Amer: 88 mL/min — ABNORMAL LOW (ref >90)
Glucose, Bld: 157 mg/dL — ABNORMAL HIGH (ref 70–99)
Potassium: 3.9 mEq/L (ref 3.5–5.1)
Sodium: 137 mEq/L (ref 135–145)
Total Bilirubin: 0.4 mg/dL (ref 0.3–1.2)

## 2013-04-18 LAB — CBC
HCT: 41.4 % (ref 39.0–52.0)
Hemoglobin: 14.3 g/dL (ref 13.0–17.0)
MCH: 31.8 pg (ref 26.0–34.0)
MCHC: 34.5 g/dL (ref 30.0–36.0)
MCV: 92.2 fL (ref 78.0–100.0)
Platelets: 173 10*3/uL (ref 150–400)
RBC: 4.49 MIL/uL (ref 4.22–5.81)
RDW: 14.1 % (ref 11.5–15.5)

## 2013-04-18 LAB — TROPONIN I
Troponin I: <0.3 ng/mL (ref <0.30)
Troponin I: <0.3 ng/mL (ref <0.30)
Troponin I: <0.3 ng/mL (ref <0.30)

## 2013-04-18 LAB — POCT I-STAT TROPONIN I: Troponin i, poc: 0 ng/mL (ref 0.00–0.08)

## 2013-04-18 MED ORDER — TECHNETIUM TC 99M MEBROFENIN IV KIT
5.0000 | PACK | Freq: Once | INTRAVENOUS | Status: AC | PRN
  Administered 2013-04-18: 5 via INTRAVENOUS

## 2013-04-18 MED ORDER — LORAZEPAM 2 MG/ML IJ SOLN: 1.0000 mg | Freq: Four times a day (QID) | INTRAMUSCULAR | Status: AC | PRN

## 2013-04-18 MED ORDER — ONDANSETRON HCL 4 MG/2ML IJ SOLN: 4.0000 mg | Freq: Three times a day (TID) | INTRAMUSCULAR | Status: DC | PRN

## 2013-04-18 MED ORDER — PIPERACILLIN-TAZOBACTAM 3.375 G IVPB 30 MIN
3.3750 g | Freq: Once | INTRAVENOUS | Status: AC
  Administered 2013-04-18: 3.375 g via INTRAVENOUS
  Filled 2013-04-18: qty 50

## 2013-04-18 MED ORDER — LORAZEPAM 1 MG PO TABS: 1.0000 mg | ORAL_TABLET | Freq: Four times a day (QID) | ORAL | Status: AC | PRN

## 2013-04-18 MED ORDER — ENOXAPARIN SODIUM 40 MG/0.4ML ~~LOC~~ SOLN
40.0000 mg | SUBCUTANEOUS | Status: DC
  Administered 2013-04-19 – 2013-04-20 (×2): 40 mg via SUBCUTANEOUS
  Filled 2013-04-18 (×3): qty 0.4

## 2013-04-18 MED ORDER — IOHEXOL 350 MG/ML SOLN
100.0000 mL | Freq: Once | INTRAVENOUS | Status: AC | PRN
  Administered 2013-04-18: 100 mL via INTRAVENOUS

## 2013-04-18 MED ORDER — ONDANSETRON HCL 4 MG/2ML IJ SOLN: 4.0000 mg | Freq: Four times a day (QID) | INTRAMUSCULAR | Status: DC | PRN

## 2013-04-18 MED ORDER — MORPHINE SULFATE 2 MG/ML IJ SOLN
2.0000 mg | INTRAMUSCULAR | Status: AC | PRN
  Administered 2013-04-19 (×3): 2 mg via INTRAVENOUS
  Filled 2013-04-18 (×3): qty 1

## 2013-04-18 MED ORDER — ONDANSETRON HCL 4 MG PO TABS: 4.0000 mg | ORAL_TABLET | Freq: Four times a day (QID) | ORAL | Status: DC | PRN

## 2013-04-18 MED ORDER — MORPHINE SULFATE 4 MG/ML IJ SOLN
4.0000 mg | Freq: Once | INTRAMUSCULAR | Status: AC
  Administered 2013-04-18: 4 mg via INTRAVENOUS
  Filled 2013-04-18: qty 1

## 2013-04-18 MED ORDER — MORPHINE SULFATE 2 MG/ML IJ SOLN
1.0000 mg | INTRAMUSCULAR | Status: DC | PRN
  Administered 2013-04-18: 1 mg via INTRAVENOUS
  Filled 2013-04-18: qty 1

## 2013-04-18 MED ORDER — ASPIRIN EC 325 MG PO TBEC
325.0000 mg | DELAYED_RELEASE_TABLET | Freq: Once | ORAL | Status: AC
  Administered 2013-04-18: 325 mg via ORAL
  Filled 2013-04-18: qty 1

## 2013-04-18 MED ORDER — ACETAMINOPHEN 325 MG PO TABS
650.0000 mg | ORAL_TABLET | Freq: Four times a day (QID) | ORAL | Status: DC | PRN
  Administered 2013-04-18: 650 mg via ORAL
  Administered 2013-04-19: 325 mg via ORAL
  Administered 2013-04-20: 650 mg via ORAL
  Filled 2013-04-18 (×3): qty 2

## 2013-04-18 MED ORDER — ACETAMINOPHEN 650 MG RE SUPP: 650.0000 mg | Freq: Four times a day (QID) | RECTAL | Status: DC | PRN

## 2013-04-18 MED ORDER — SODIUM CHLORIDE 0.9 % IJ SOLN
3.0000 mL | Freq: Two times a day (BID) | INTRAMUSCULAR | Status: DC
  Administered 2013-04-18 – 2013-04-22 (×3): 3 mL via INTRAVENOUS

## 2013-04-18 MED ORDER — GI COCKTAIL ~~LOC~~
30.0000 mL | ORAL | Status: AC
  Administered 2013-04-18: 30 mL via ORAL
  Filled 2013-04-18: qty 30

## 2013-04-18 MED ORDER — PIPERACILLIN-TAZOBACTAM 3.375 G IVPB
3.3750 g | Freq: Three times a day (TID) | INTRAVENOUS | Status: DC
  Administered 2013-04-18 – 2013-04-22 (×12): 3.375 g via INTRAVENOUS
  Filled 2013-04-18 (×16): qty 50

## 2013-04-18 MED ORDER — MORPHINE SULFATE 2 MG/ML IJ SOLN: 1.0000 mg | INTRAMUSCULAR | Status: DC | PRN

## 2013-04-18 MED ORDER — MORPHINE SULFATE 2 MG/ML IJ SOLN
1.0000 mg | Freq: Once | INTRAMUSCULAR | Status: AC
  Administered 2013-04-18: 1 mg via INTRAVENOUS
  Filled 2013-04-18: qty 1

## 2013-04-18 MED ORDER — THIAMINE HCL 100 MG/ML IJ SOLN
Freq: Once | INTRAVENOUS | Status: AC
  Administered 2013-04-18: 23:00:00 via INTRAVENOUS
  Filled 2013-04-18: qty 1000

## 2013-04-18 MED ORDER — MORPHINE SULFATE 4 MG/ML IJ SOLN
4.0000 mg | Freq: Once | INTRAMUSCULAR | Status: AC
Start: 2013-04-18 — End: 2013-04-18
  Administered 2013-04-18: 4 mg via INTRAVENOUS
  Filled 2013-04-18: qty 1

## 2013-04-18 NOTE — ED Notes (Signed)
Pt returned from CT °

## 2013-04-18 NOTE — ED Notes (Signed)
NM called and stated will have to wait six hours after morphine was given.  Morphine was given 1210.

## 2013-04-18 NOTE — ED Notes (Signed)
Pt. reports worsening mid chest /epigastric pain onset 1 am this morning with nausea, vomitting and diaphoresis . Pt. took 1 baby ASA and Plavix prior to arrival . No SOB or chest congestion . History of CABG and CAD , his cardiologist is Dr. Antoine Poche .

## 2013-04-18 NOTE — ED Provider Notes (Signed)
CSN: [id number]     Arrival date & time 04/18/13  7829 History   First MD Initiated Contact with Patient 04/18/13 0700     Chief Complaint  Patient presents with  . Chest Pain   (Consider location/radiation/quality/duration/timing/severity/associated sxs/prior Treatment) HPI Comments: Pt is a 67 y.o. male with Pmhx as above who presents with sudden onset chest & abdominal pain at about 1:30am with radiation to his back, nausea, vomx3, diaphoresis.  No SOB, fever, d/a, urinary symptoms, leg pain/swelling.   Patient is a 67 y.o. male presenting with chest pain. The history is provided by the patient. No language interpreter was used.  Chest Pain Pain location:  Substernal area Pain quality: aching   Pain radiates to:  Mid back Pain radiates to the back: yes   Pain severity:  Moderate Onset quality:  Sudden Duration:  6 hours Timing:  Constant Progression:  Unchanged Chronicity:  New Context: at rest   Relieved by:  Nothing Worsened by:  Nothing tried Ineffective treatments:  None tried Associated symptoms: abdominal pain, back pain, diaphoresis, nausea and vomiting   Associated symptoms: no cough, no dizziness, no dysphagia, no fatigue, no fever, no headache, no lower extremity edema, no numbness, no palpitations, no shortness of breath and no weakness   Abdominal pain:    Location:  LUQ, epigastric and RUQ   Quality:  Aching   Severity:  Moderate   Onset quality:  Sudden   Duration:  6 hours   Timing:  Constant   Progression:  Unchanged   Chronicity:  New Vomiting:    Quality:  Unable to specify   Number of occurrences:  3   Severity:  Moderate   Duration:  6 hours   Timing:  Sporadic   Progression:  Resolved Risk factors: coronary artery disease, high cholesterol, hypertension and male sex   Risk factors: not obese and no smoking     Past Medical History  Diagnosis Date  . CAD (coronary artery disease)     a.S/P CABG 12/00. b. cath 3/12: L-LAD ok, S-RI 95% tx with  BMS/thrombectomy, S-AM-RCA ok, EF 60%.  Marland Kitchen HTN (hypertension)   . GERD (gastroesophageal reflux disease)   . Hyperlipidemia   . Hyperglycemia   . Vitamin D deficiency   . Colonic polyp     X2  . Peyronie disease   . Low testosterone     Dr Evelene Croon, [place] ,[place]  . Sinus bradycardia   . RBBB   . Diverticulitis 2008/2009   Past Surgical History  Procedure Laterality Date  . Colonoscopy w/ polypectomy  2004 & 2009     X 2; Dr Russella Dar; due 2014  . Coronary artery bypass graft  04/1999    4 vessels  . Vasectomy    . Coronary stent placement  2012    Plavix   Family History  Problem Relation Age of Onset  . Aortic aneurysm Father 60    ? abdominal  . Diabetes      PGaunt  . Coronary artery disease Mother     CABG in 41s  . Diabetes Brother   . Cancer Neg Hx   . CVA Mother     cns bleed from warfarin  . Heart attack Father 73   History  Substance Use Topics  . Smoking status: Never Smoker   . Smokeless tobacco: Never Used  . Alcohol Use: 12 - 15 oz/week    5 Shots of liquor, 15-20 Cans of beer per week  Comment: 2-4 drinks/day    Review of Systems  Constitutional: Positive for diaphoresis. Negative for fever, activity change, appetite change and fatigue.  HENT: Negative for congestion, facial swelling, rhinorrhea and trouble swallowing.   Eyes: Negative for photophobia and pain.  Respiratory: Negative for cough, chest tightness and shortness of breath.   Cardiovascular: Positive for chest pain. Negative for palpitations and leg swelling.  Gastrointestinal: Positive for nausea, vomiting and abdominal pain. Negative for diarrhea and constipation.  Endocrine: Negative for polydipsia and polyuria.  Genitourinary: Negative for dysuria, urgency, decreased urine volume and difficulty urinating.  Musculoskeletal: Positive for back pain. Negative for gait problem.  Skin: Negative for color change, rash and wound.  Allergic/Immunologic: Negative for immunocompromised state.   Neurological: Negative for dizziness, facial asymmetry, speech difficulty, weakness, numbness and headaches.  Psychiatric/Behavioral: Negative for confusion, decreased concentration and agitation.    Allergies  Oxycodone-acetaminophen  Home Medications   Current Outpatient Rx  Name  Route  Sig  Dispense  Refill  . Ascorbic Acid (VITAMIN C) 1000 MG tablet   Oral   Take 1,000 mg by mouth daily.           Marland Kitchen aspirin 325 MG tablet   Oral   Take 325 mg by mouth daily.           Marland Kitchen Bioflavonoid Products (GRAPE SEED PO)   Oral   Take 1 capsule by mouth daily.         . Cholecalciferol (VITAMIN D3) 5000 UNITS CAPS   Oral   Take by mouth daily.         . clonazePAM (KLONOPIN) 0.5 MG tablet      1 qhs prn for restless legs   30 tablet   1   . clopidogrel (PLAVIX) 75 MG tablet   Oral   Take 1 tablet (75 mg total) by mouth daily.   30 tablet   11   . co-enzyme Q-10 30 MG capsule   Oral   Take 30 mg by mouth daily.          . metoprolol tartrate (LOPRESSOR) 25 MG tablet   Oral   Take 0.5 tablets (12.5 mg total) by mouth 2 (two) times daily.   30 tablet   6   . Multiple Vitamin (MULTIVITAMIN) tablet   Oral   Take 1 tablet by mouth daily.           . niacin (NIASPAN) 1000 MG CR tablet   Oral   Take 1 tablet (1,000 mg total) by mouth at bedtime.   30 tablet   11   . nitroGLYCERIN (NITROSTAT) 0.4 MG SL tablet   Sublingual   Place 1 tablet (0.4 mg total) under the tongue every 5 (five) minutes as needed.   25 tablet   5   . Omega-3 Fatty Acids (FISH OIL) 1000 MG CAPS      1 tab po qd          . RABEprazole (ACIPHEX) 20 MG tablet   Oral   Take 1 tablet (20 mg total) by mouth daily.   90 tablet   1   . ramipril (ALTACE) 10 MG capsule   Oral   Take 1 capsule (10 mg total) by mouth daily.   90 capsule   0   . rosuvastatin (CRESTOR) 40 MG tablet   Oral   Take 1 tablet (40 mg total) by mouth daily.   30 tablet   10   . [place],  Serenoa  repens, (SAW PALMETTO PO)   Oral   Take 1 capsule by mouth daily.         . SELENIUM PO   Oral   Take 1 tablet by mouth daily.         . tadalafil (CIALIS) 20 MG tablet   Oral   Take 20 mg by mouth daily.         . valACYclovir (VALTREX) 500 MG tablet   Oral   Take 1 tablet (500 mg total) by mouth daily.   90 tablet   2   . vitamin E 100 UNIT capsule   Oral   Take 100 Units by mouth daily.            BP 118/55  Pulse 81  Temp(Src) 100 F (37.8 C) (Oral)  Resp 16  SpO2 96% Physical Exam  Constitutional: He is oriented to person, place, and time. He appears well-developed and well-nourished. No distress.  HENT:  Head: Normocephalic and atraumatic.  Mouth/Throat: No oropharyngeal exudate.  Eyes: Pupils are equal, round, and reactive to light.  Neck: Normal range of motion. Neck supple.  Cardiovascular: Normal rate, regular rhythm and normal heart sounds.  Exam reveals no gallop and no friction rub.   No murmur heard. Pulmonary/Chest: Effort normal and breath sounds normal. No respiratory distress. He has no wheezes. He has no rales.  Abdominal: Soft. Bowel sounds are normal. He exhibits no distension and no mass. There is tenderness in the right upper quadrant, epigastric area and left upper quadrant. There is no rebound, no guarding and no CVA tenderness.  Musculoskeletal: Normal range of motion. He exhibits no edema and no tenderness.  Neurological: He is alert and oriented to person, place, and time.  Skin: Skin is warm and dry.  Psychiatric: He has a normal mood and affect.    ED Course  Procedures (including critical care time) Labs Review Labs Reviewed  COMPREHENSIVE METABOLIC PANEL - Abnormal; Notable for the following:    Glucose, Bld 157 (*)    GFR calc non Af Amer 88 (*)    All other components within normal limits  URINALYSIS, ROUTINE W REFLEX MICROSCOPIC - Abnormal; Notable for the following:    Color, Urine AMBER (*)    APPearance HAZY (*)     Bilirubin Urine SMALL (*)    Ketones, ur 15 (*)    Protein, ur 30 (*)    All other components within normal limits  URINE MICROSCOPIC-ADD ON - Abnormal; Notable for the following:    Casts HYALINE CASTS (*)    All other components within normal limits  LIPASE, BLOOD  TROPONIN I  CBC  PROTIME-INR  TROPONIN I  TROPONIN I  HEMOGLOBIN A1C  POCT I-STAT TROPONIN I   Imaging Review Dg Chest 2 View  04/18/2013   CLINICAL DATA:  Left chest pain, left flank pain.  EXAM: CHEST  2 VIEW  COMPARISON:  08/30/2010  FINDINGS: There is no focal parenchymal opacity, pleural effusion, or pneumothorax. The heart and mediastinal contours are unremarkable. There is evidence of prior CABG.  The osseous structures are unremarkable.  IMPRESSION: No active cardiopulmonary disease.   Electronically Signed   By: Elige Ko   On: 04/18/2013 07:43   US Abdomen Complete  04/18/2013   CLINICAL DATA:  Epigastric and right upper abdominal pain and back pain.  EXAM: ULTRASOUND ABDOMEN COMPLETE  COMPARISON:  None.  FINDINGS: Gallbladder:  No gallstones or wall thickening visualized. No sonographic Eulah Pont  sign noted.  Common bile duct:  Diameter: 1.3 mm.  Liver:  No focal lesion identified. Within normal limits in parenchymal echogenicity.  IVC:  No abnormality visualized.  Pancreas:  The pancreas was obscured by bowel gas.  Spleen:  Size and appearance within normal limits.  Right Kidney:  Length: 13.1 cm. Echogenicity within normal limits. No mass or hydronephrosis visualized.  Left Kidney:  Length: 12.4 cm. Echogenicity within normal limits. There are parapelvic cysts. There is no evidence of hydronephrosis.  Abdominal aorta:  Bowel gas obscures the midportion of the abdominal aorta. The maximal diameter of the abdominal aorta is seen. Proximally it measures 2.9 cm.  Other findings:  No ascites is demonstrated.  IMPRESSION: 1. The gallbladder is normal in appearance with no evidence of stones nor findings to suggest acute  cholecystitis. The common bile duct is normal in caliber. 2. The liver, spleen, and kidneys exhibit no acute abnormalities. The pancreas is obscured by bowel gas. 3. The visualized portions of the abdominal aorta appear normal.   Electronically Signed   By: David  [place]   On: 04/18/2013 09:49   Ct Angio Chest Aortic Dissect W &/or W/o  04/18/2013   CLINICAL DATA:  67 year old with abdominal pain.  EXAM: CT ANGIOGRAPHY CHEST, ABDOMEN AND PELVIS  TECHNIQUE: Multidetector CT imaging through the chest, abdomen and pelvis was performed using the standard protocol during bolus administration of intravenous contrast. Multiplanar reconstructed images including MIPs were obtained and reviewed to evaluate the vascular anatomy.  CONTRAST:  OMNIPAQUE IOHEXOL 350 MG/ML SOLN  COMPARISON:  Ultrasound 04/18/2013  FINDINGS: CTA CHEST FINDINGS  Negative for an aortic dissection or intramural hematoma. Patient had a median sternotomy and CABG procedure. Ascending thoracic aorta is prominent for size measuring up to 4 cm. The great vessels are patent. Descending thoracic aorta measures up to 3.4 cm. The pulmonary artery is slightly ectatic but no evidence for large filling defects in the main pulmonary arteries. No evidence for mediastinal, hilar or axillary lymphadenopathy. No significant pericardial or pleural fluid. Mild dilatation of the distal esophagus. The trachea and mainstem bronchi are patent. Both lungs are clear without airspace disease or consolidation. No acute bone abnormality.  Review of the MIP images confirms the above findings.  CTA ABDOMEN AND PELVIS FINDINGS  The abdominal aorta is patent without dissection or aneurysm. There is flow in the celiac trunk, SMA and inferior mesenteric artery. Atherosclerotic calcifications in the abdominal aorta. The patient has bilateral accessory renal arteries. There is plaque and at least mild stenosis at the origin of the main left renal artery. Incidentally, the left  gastric artery originates from the aorta and just above the celiac trunk. Dilatation of the proximal right common iliac artery measuring 1.7 cm. There is a small amount of irregular plaque in the right common iliac artery and this could represent a 0.5 cm penetrating ulcer. Right external iliac arteries are patent. Proximal right femoral arteries are patent. Dilatation of the left common iliac artery measuring 1.6 cm. There is occlusion of the proximal left internal iliac iliac artery just beyond the origin with distal reconstitution of the left internal iliac branches. Left external iliac artery is widely patent. Proximal left femoral arteries are patent.  The gallbladder is distended and there is a small amount of fluid along the inferior aspect of the gallbladder. No definite gallstones. Limited evaluation of the portal venous system on this arterial study. There is concern for a trace amount of fluid around the spleen. No  gross abnormality to the stomach or duodenum. No gross abnormality to the pancreas. No clear evidence for biliary or pancreatic duct dilatation. Normal appearance of both adrenal glands. Exophytic cystic structure along the medial aspect of the right kidney measures 1.9 cm. Few small densities within this cystic structure could represent small septations. Multiple low-density structures associated with the left kidney probably represent parapelvic and cortical cysts. Incidentally, the patient has a retroaortic left renal vein. No significant lymphadenopathy. Normal appearance of the prostate and urinary bladder. Small left inguinal hernia containing fat. Diverticulosis in the sigmoid colon and left colon. Normal appearance of the terminal ileum. The appendix may have been surgically removed. No gross abnormality to the small or large bowel. No acute bone abnormalities.  Review of the MIP images confirms the above findings.  IMPRESSION: The gallbladder is distended with mild stranding around the  gallbladder. Findings are concerning for acute acalculous cholecystitis based on the prior ultrasound findings.  Negative for aortic dissection. Mild dilatation of the ascending thoracic aorta.  Mild narrowing of the main left renal artery. Patient has bilateral accessory renal arteries.  Focal irregular plaque in the right common iliac artery. Findings could represent a small penetrating ulcer. This area roughly measures 0.5 cm.  Proximal occlusion of the left internal iliac artery.  Bilateral renal cysts. Exophytic cyst in the right kidney may contain septations. This area was not specifically evaluated on the recent ultrasound and consider a follow-up renal ultrasound to follow this cystic structure.   Electronically Signed   By: Richarda Overlie M.D.   On: 04/18/2013 13:22   Ct Angio Abd/pel W/ And/or W/o  04/18/2013   CLINICAL DATA:  67 year old with abdominal pain.  EXAM: CT ANGIOGRAPHY CHEST, ABDOMEN AND PELVIS  TECHNIQUE: Multidetector CT imaging through the chest, abdomen and pelvis was performed using the standard protocol during bolus administration of intravenous contrast. Multiplanar reconstructed images including MIPs were obtained and reviewed to evaluate the vascular anatomy.  CONTRAST:  OMNIPAQUE IOHEXOL 350 MG/ML SOLN  COMPARISON:  Ultrasound 04/18/2013  FINDINGS: CTA CHEST FINDINGS  Negative for an aortic dissection or intramural hematoma. Patient had a median sternotomy and CABG procedure. Ascending thoracic aorta is prominent for size measuring up to 4 cm. The great vessels are patent. Descending thoracic aorta measures up to 3.4 cm. The pulmonary artery is slightly ectatic but no evidence for large filling defects in the main pulmonary arteries. No evidence for mediastinal, hilar or axillary lymphadenopathy. No significant pericardial or pleural fluid. Mild dilatation of the distal esophagus. The trachea and mainstem bronchi are patent. Both lungs are clear without airspace disease or  consolidation. No acute bone abnormality.  Review of the MIP images confirms the above findings.  CTA ABDOMEN AND PELVIS FINDINGS  The abdominal aorta is patent without dissection or aneurysm. There is flow in the celiac trunk, SMA and inferior mesenteric artery. Atherosclerotic calcifications in the abdominal aorta. The patient has bilateral accessory renal arteries. There is plaque and at least mild stenosis at the origin of the main left renal artery. Incidentally, the left gastric artery originates from the aorta and just above the celiac trunk. Dilatation of the proximal right common iliac artery measuring 1.7 cm. There is a small amount of irregular plaque in the right common iliac artery and this could represent a 0.5 cm penetrating ulcer. Right external iliac arteries are patent. Proximal right femoral arteries are patent. Dilatation of the left common iliac artery measuring 1.6 cm. There is occlusion of the  proximal left internal iliac iliac artery just beyond the origin with distal reconstitution of the left internal iliac branches. Left external iliac artery is widely patent. Proximal left femoral arteries are patent.  The gallbladder is distended and there is a small amount of fluid along the inferior aspect of the gallbladder. No definite gallstones. Limited evaluation of the portal venous system on this arterial study. There is concern for a trace amount of fluid around the spleen. No gross abnormality to the stomach or duodenum. No gross abnormality to the pancreas. No clear evidence for biliary or pancreatic duct dilatation. Normal appearance of both adrenal glands. Exophytic cystic structure along the medial aspect of the right kidney measures 1.9 cm. Few small densities within this cystic structure could represent small septations. Multiple low-density structures associated with the left kidney probably represent parapelvic and cortical cysts. Incidentally, the patient has a retroaortic left renal  vein. No significant lymphadenopathy. Normal appearance of the prostate and urinary bladder. Small left inguinal hernia containing fat. Diverticulosis in the sigmoid colon and left colon. Normal appearance of the terminal ileum. The appendix may have been surgically removed. No gross abnormality to the small or large bowel. No acute bone abnormalities.  Review of the MIP images confirms the above findings.  IMPRESSION: The gallbladder is distended with mild stranding around the gallbladder. Findings are concerning for acute acalculous cholecystitis based on the prior ultrasound findings.  Negative for aortic dissection. Mild dilatation of the ascending thoracic aorta.  Mild narrowing of the main left renal artery. Patient has bilateral accessory renal arteries.  Focal irregular plaque in the right common iliac artery. Findings could represent a small penetrating ulcer. This area roughly measures 0.5 cm.  Proximal occlusion of the left internal iliac artery.  Bilateral renal cysts. Exophytic cyst in the right kidney may contain septations. This area was not specifically evaluated on the recent ultrasound and consider a follow-up renal ultrasound to follow this cystic structure.   Electronically Signed   By: Richarda Overlie M.D.   On: 04/18/2013 13:22    EKG Interpretation   None       MDM   1. Upper abdominal pain   2. Overweight(278.02)   3. Coronary atherosclerosis of artery bypass graft   4. Esophageal reflux   5. Unspecified essential hypertension   6. Chest pain    Pt is a 67 y.o. male with Pmhx as above who presents with sudden onset chest & abdominal pain at about 1:30am with radiation to his back, nausea, vomx3, diaphoresis.  No SOB, fever, d/a, urinary symptoms, leg pain/swelling.  On PE, VSS, pt in NAD, reports upper abdominal pain, mid-sternal CP, low back pain.   No rebound or guarding.  cardiopulm exam benign.  EKG unchanged from prior.  Will given ASA.  NTG will not be given due to regular  cialis use.   On repeat exam,s CP resolved, pain no localized to epigastrium & RUQ.  Ab [place] ordered & was nml. Cardiology has seen pt, added repeat troponin, CTA chest, ab pelvis to r/o dissection, mesenteric ischemia.  Repeat trop negative, but CT studies showed GB distension, mild stranding concerning for acute acalculous cholecystitis.  Also had irregular area of plaque in R common iliac artery which could represent small penetrating ulcer per report.  Pt has nml femoral pulses and LE pulses.  Gen surgery consulted, will see in the ED. T 100.3   4:11 PM Gen surg has seen the pt.  Recommending HIDA scan and medical  admit.  They will follow.  [facility] consulted for admission. 3rd trop nml     Shanna Cisco, MD 04/18/13 [phone]

## 2013-04-18 NOTE — Progress Notes (Signed)
ANTIBIOTIC CONSULT NOTE - INITIAL  Pharmacy Consult for zosyn Indication: ?cholecystitis  Allergies  Allergen Reactions  . Oxycodone-Acetaminophen     REACTION: RASH - tolerated morphine    Patient Measurements:    Weight: 95.7 kg  Vital Signs: Temp: 100 F (37.8 C) (12/02 1520) Temp src: Oral (12/02 1520) BP: 118/55 mmHg (12/02 1530) Pulse Rate: 81 (12/02 1530) Intake/Output from previous day:   Intake/Output from this shift:    Labs:  Recent Labs  04/18/13 0713  WBC 10.5  HGB 14.3  PLT 173  CREATININE 0.89   The CrCl is unknown because both a height and weight (above a minimum accepted value) are required for this calculation. No results found for this basename: VANCOTROUGH, VANCOPEAK, VANCORANDOM, GENTTROUGH, GENTPEAK, GENTRANDOM, TOBRATROUGH, TOBRAPEAK, TOBRARND, AMIKACINPEAK, AMIKACINTROU, AMIKACIN,  in the last 72 hours   Microbiology: No results found for this or any previous visit (from the past 720 hour(s)).  Medical History: Past Medical History  Diagnosis Date  . CAD (coronary artery disease)     a.S/P CABG 12/00. b. cath 3/12: L-LAD ok, S-RI 95% tx with BMS/thrombectomy, S-AM-RCA ok, EF 60%.  Marland Kitchen HTN (hypertension)   . GERD (gastroesophageal reflux disease)   . Hyperlipidemia   . Hyperglycemia   . Vitamin D deficiency   . Colonic polyp     X2  . Peyronie disease   . Low testosterone     Dr Evelene Croon, [place] ,[place]  . Sinus bradycardia   . RBBB   . Diverticulitis 2008/2009    Medications:  Only abx PTA is valtrex 500 mg po qday,  Assessment: 67 yo man with abdominal pain to start zosyn per pharmacy for r/o cholecystitis.  Wt ~ 96 kg.  WBC 10.5, temp 100, creat 0.89.  He is on valtrex 500 po qday PTA.  CT scan of abd/pelvis concerning for acute acalculous cholecystitis based on the prior ultrasound findings   Goal of Therapy:  Eradicate infection  Plan:  1. Zosyn 3.375 gm IV x 1 dose over 30 minutes, then zosyn 3.375 gm IV q8h, infuse  each dose over 4 hours 2. F/u renal rxn, wbc, temp, cultures and clinical course Herby Abraham, Pharm.D. [phone] 04/18/2013 5:17 PM

## 2013-04-18 NOTE — Consult Note (Signed)
Cardiology Consultation  Patient ID: Kevin Webb MRN: [id number], DOB: 11/26/1945 Date of Encounter: 04/18/2013, 11:23 AM Primary Physician: Marga Melnick, MD Primary Cardiologist: Dr. Antoine Poche  Chief Complaint: CP Reason for Admission: CP  HPI: Kevin Webb is a 67 y/o M with history of CAD s/p CABG 2000, BMS to S-RI 07/2010, HTN, HL, RBBB, GERD, habitual EtOH use who presented to [facility] today with CP and upper abdominal discomfort. He has been doing well recently without any exertional CP or SOB. However, around 1:30am he awoke with substernal chest discomfort that initially felt like heartburn. It then radiated into his back between his shoulder blades but also down across his upper abdomen. He got up and took 2 Pepcid AC but then had nausea & vomiting x 3 with additional dry heaving. No hemoptysis or coffee ground emesis. The pain persisted for several hours. It was worse with even the slightest movement. He began pacing to see if it helped the pain - this did not improve or worsen the discomfort. Pain was not worse with inspiration or palpation. He took [employer] with no relief. No SOB, diaphoresis, palpitations. Finally after unrelenting pain around 6am he decided to drive himself to the hospital. He was given 325mg  ASA. He did not try any NTG throughout any of this episode (regular Cialis use). 4mg  morphine brought pain down from 10/10 to 3/10. This pain is totally different than prior cardiac pain. It is somewhat similar to an episode he had in Feb 2014 later deemed to be acid reflux, but this has persisted much longer. In the ER, troponin neg, labs unremarkable except glu 157 including normal lipase, LFTs. Abd [place], CXR unremarkable, EKG nonacute, VSS. No fever, orthopnea, PND, urinary sx, weight gain, syncope. Reports chronic intermittent LEE that is not present today. Drove to [facility] and back on Sunday but got out of car frequently. The pain now is primarily across his upper  abdomen.  BP right arm - 154/75 BP left arm - 154/80  Past Medical History  Diagnosis Date  . CAD (coronary artery disease)     a.S/P CABG 12/00. b. cath 3/12: L-LAD ok, S-RI 95% tx with BMS/thrombectomy, S-AM-RCA ok, EF 60%.  Marland Kitchen HTN (hypertension)   . GERD (gastroesophageal reflux disease)   . Hyperlipidemia   . Hyperglycemia   . Vitamin D deficiency   . Colonic polyp     X2  . Peyronie disease   . Low testosterone     Dr Evelene Croon, [place] ,[place]  . Sinus bradycardia   . RBBB      Most Recent Cardiac Studies: Last Cath 2012 - see scanned in prog note from 07/2010 for diagnostic portion which reports: - normal LV function - severe native 2V CAD. - nonobst CAD in LCx - SVG-AM-RCA OK -LIMA-LAD OK, -SVG-ramus 95% --> had angioplasty/BMS to prox SVG-ramus. There was residual thrombus treated with thrombectomy catheter.    Surgical History:  Past Surgical History  Procedure Laterality Date  . Colonoscopy w/ polypectomy  2004 & 2009     X 2; Dr Russella Dar; due 2014  . Coronary artery bypass graft  04/1999    4 vessels  . Vasectomy    . Coronary stent placement  2012    Plavix     Home Meds: Prior to Admission medications   Medication Sig Start Date End Date Taking? Authorizing Provider  Ascorbic Acid (VITAMIN C) 1000 MG tablet Take 1,000 mg by mouth daily.     Yes  Historical Provider, MD  aspirin 325 MG tablet Take 325 mg by mouth daily.     Yes Historical Provider, MD  Bioflavonoid Products (GRAPE SEED PO) Take 1 capsule by mouth daily.   Yes Historical Provider, MD  Cholecalciferol (VITAMIN D3) 5000 UNITS CAPS Take by mouth daily.   Yes Historical Provider, MD  clonazePAM (KLONOPIN) 0.5 MG tablet 1 qhs prn for restless legs 10/31/12  Yes Pecola Lawless, MD  clopidogrel (PLAVIX) 75 MG tablet Take 1 tablet (75 mg total) by mouth daily. 01/24/13  Yes Rollene Rotunda, MD  co-enzyme Q-10 30 MG capsule Take 30 mg by mouth daily.    Yes Historical Provider, MD  metoprolol tartrate  (LOPRESSOR) 25 MG tablet Take 0.5 tablets (12.5 mg total) by mouth 2 (two) times daily. 03/14/13  Yes Laurey Morale, MD  Multiple Vitamin (MULTIVITAMIN) tablet Take 1 tablet by mouth daily.     Yes Historical Provider, MD  niacin (NIASPAN) 1000 MG CR tablet Take 1 tablet (1,000 mg total) by mouth at bedtime. 01/24/13  Yes Rollene Rotunda, MD  nitroGLYCERIN (NITROSTAT) 0.4 MG SL tablet Place 1 tablet (0.4 mg total) under the tongue every 5 (five) minutes as needed. 06/13/12  Yes Pecola Lawless, MD  Omega-3 Fatty Acids (FISH OIL) 1000 MG CAPS 1 tab po qd    Yes Historical Provider, MD  RABEprazole (ACIPHEX) 20 MG tablet Take 1 tablet (20 mg total) by mouth daily. 12/27/12  Yes Pecola Lawless, MD  ramipril (ALTACE) 10 MG capsule Take 1 capsule (10 mg total) by mouth daily. 01/23/13  Yes Pecola Lawless, MD  rosuvastatin (CRESTOR) 40 MG tablet Take 1 tablet (40 mg total) by mouth daily. 02/28/13  Yes Rollene Rotunda, MD  Saw Palmetto, Serenoa repens, (SAW PALMETTO PO) Take 1 capsule by mouth daily.   Yes Historical Provider, MD  SELENIUM PO Take 1 tablet by mouth daily.   Yes Historical Provider, MD  tadalafil (CIALIS) 20 MG tablet Take 20 mg by mouth daily.   Yes Historical Provider, MD  valACYclovir (VALTREX) 500 MG tablet Take 1 tablet (500 mg total) by mouth daily. 04/02/11  Yes Pecola Lawless, MD  vitamin E 100 UNIT capsule Take 100 Units by mouth daily.     Yes Historical Provider, MD    Allergies:  Allergies  Allergen Reactions  . Oxycodone-Acetaminophen     REACTION: RASH - tolerated morphine    History   Social History  . Marital Status: Single    Spouse Name: N/A    Number of Children: N/A  . Years of Education: N/A   Occupational History  .      plumber   Social History Main Topics  . Smoking status: Never Smoker   . Smokeless tobacco: Never Used  . Alcohol Use: 3.0 oz/week    5 Shots of liquor per week     Comment: 2-4 drinks/day  . Drug Use: No  . Sexual Activity:  Not on file   Other Topics Concern  . Not on file   Social History Narrative  . No narrative on file     Family History  Problem Relation Age of Onset  . Aortic aneurysm Father 60    ? abdominal  . Diabetes      PGaunt  . Coronary artery disease Mother     CABG in 15s  . Diabetes Brother   . Cancer Neg Hx   . CVA Mother     cns bleed  from warfarin  . Heart attack Father 48    Review of Systems: General: negative for chills, fever, night sweats. 4lb weight loss after trying to eat healthier.  Cardiovascular: see above Dermatological: negative for rash Respiratory: negative for cough or wheezing Urologic: negative for hematuria Abdominal: negative for bright red blood per rectum, melena, or hematemesis Neurologic: negative for visual changes, syncope, or dizziness All other systems reviewed and are otherwise negative except as noted above.  Labs:   Lab Results  Component Value Date   WBC 10.5 04/18/2013   HGB 14.3 04/18/2013   HCT 41.4 04/18/2013   MCV 92.2 04/18/2013   PLT 173 04/18/2013     Recent Labs Lab 04/18/13 0713  NA 137  K 3.9  CL 101  CO2 25  BUN 7  CREATININE 0.89  CALCIUM 9.1  PROT 6.7  BILITOT 0.4  ALKPHOS 47  ALT 22  AST 32  GLUCOSE 157*    Recent Labs  04/18/13 0713  TROPONINI <0.30   Radiology/Studies:  Dg Chest 2 View  04/18/2013   CLINICAL DATA:  Left chest pain, left flank pain.  EXAM: CHEST  2 VIEW  COMPARISON:  08/30/2010  FINDINGS: There is no focal parenchymal opacity, pleural effusion, or pneumothorax. The heart and mediastinal contours are unremarkable. There is evidence of prior CABG.  The osseous structures are unremarkable.  IMPRESSION: No active cardiopulmonary disease.   Electronically Signed   By: Elige Ko   On: 04/18/2013 07:43   US Abdomen Complete  04/18/2013   CLINICAL DATA:  Epigastric and right upper abdominal pain and back pain.  EXAM: ULTRASOUND ABDOMEN COMPLETE  COMPARISON:  None.  FINDINGS: Gallbladder:  No  gallstones or wall thickening visualized. No sonographic Murphy sign noted.  Common bile duct:  Diameter: 1.3 mm.  Liver:  No focal lesion identified. Within normal limits in parenchymal echogenicity.  IVC:  No abnormality visualized.  Pancreas:  The pancreas was obscured by bowel gas.  Spleen:  Size and appearance within normal limits.  Right Kidney:  Length: 13.1 cm. Echogenicity within normal limits. No mass or hydronephrosis visualized.  Left Kidney:  Length: 12.4 cm. Echogenicity within normal limits. There are parapelvic cysts. There is no evidence of hydronephrosis.  Abdominal aorta:  Bowel gas obscures the midportion of the abdominal aorta. The maximal diameter of the abdominal aorta is seen. Proximally it measures 2.9 cm.  Other findings:  No ascites is demonstrated.  IMPRESSION: 1. The gallbladder is normal in appearance with no evidence of stones nor findings to suggest acute cholecystitis. The common bile duct is normal in caliber. 2. The liver, spleen, and kidneys exhibit no acute abnormalities. The pancreas is obscured by bowel gas. 3. The visualized portions of the abdominal aorta appear normal.   Electronically Signed   By: David  [place]   On: 04/18/2013 09:49    EKG: SB 54bpm RBBB nonspecific ST changes  Physical Exam: Blood pressure 146/80, pulse 63, resp. rate 23, SpO2 94.00%. General: Well developed, well nourished WM, in no acute distress.  Head: Normocephalic, atraumatic, sclera non-icteric, no xanthomas, nares are without discharge.  Neck: Negative for carotid bruits. JVD not elevated. Lungs: Clear bilaterally to auscultation without wheezes, rales, or rhonchi. Breathing is unlabored. Heart: RRR with S1 S2. No murmurs, rubs, or gallops appreciated. Abdomen: Soft, non-tender, non-distended with normoactive bowel sounds. No hepatomegaly. No rebound/guarding. No obvious abdominal masses. Msk:  Strength and tone appear normal for age. Extremities: No clubbing or cyanosis. No edema.  Distal pedal pulses are 2+ and equal bilaterally. Radial pulses brisk and equal bilaterally. Neuro: Alert and oriented X 3. No focal deficit. No facial asymmetry. Moves all extremities spontaneously. Psych:  Responds to questions appropriately with a normal affect.    ASSESSMENT AND PLAN:  1. Chest pain/abdominal pain 2. CAD s/p CABG 2000, PCI 2012 3. Habitual EtOH use 4. GERD 5. HTN 6. Hyperlipidemia 7. Hyperglycemia  He is hemodynamically stable, but will proceed with stat CT angio of chest to rule out dissection given the history he provides. Per Dr. Lindaann Slough request, will also add on CT angio abd/pelvis to rule out ischemic colitis or other potential abdominal abnormalities. If no dissection, would recommend medicine admission for further workup of abdominal pain and we will consult. In the setting of his habitual EtOH abuse and GERD, PUD is a consideration. Will check another troponin. No objective evidence of ischemia thus far despite >6 hrs of pain. This is not like his prior anginal pain thus will hold off on heparin unless enzymes turn positive. Will follow.   Signed, Ronie Spies PA-C 04/18/2013, 11:23 AM  The patient was seen, examined and discussed with Ronie Spies, PA-C and I agree with the above.   In summary, Mr Marsala is a 67 year old male with h/o CAD and CABG who presented with lower retrosternal chest pain that awoke him from sleep and progressed to overall abdominal pain. The pain has improved from 10/10 with radiation to the back into 3/10 abdominal pain. This pain is very different from his usual chest pain but is similar to the pain he experienced with hiatal hernia/GERD earlier this year. Troponin is negative after 11 hours of pain. ECG is normal. We will perform chest/abdomen/pelvis angiography to rule out dissection and possible ischemic colitis. If negative we will refer to GI for further work up.   Tobias Alexander, H 04/18/2013     1. Chest pain/abdominal  pain 2. CAD s/p CABG 2000, PCI 2012 3. Habitual EtOH use 4. GERD 5. HTN 6. Hyperlipidemia 7. Hyperglycemia  He is hemodynamically stable, but will proceed with stat CT angio of chest to rule out dissection given the history he provides. Per Dr. Lindaann Slough request, will also add on CT angio abd/pelvis to rule out ischemic colitis or other potential abdominal abnormalities. If no dissection, would recommend medicine admission for further workup of abdominal pain and we will consult. In the setting of his habitual EtOH abuse and GERD, PUD is a consideration. Will check another troponin. No objective evidence of ischemia thus far despite >6 hrs of pain. This is not like his prior anginal pain thus will hold off on heparin unless enzymes turn positive. Will follow.

## 2013-04-18 NOTE — Consult Note (Signed)
Kevin Webb 20-Aug-1945  [id number]  Primary Care MD: none Requesting MD: Toy Cookey Chief Complaint/Reason for Consult: Abdominal pain  HPI: Kevin Webb is a 67 year old male with PMH of CAD, GERD, and hiatal hernia who presented to the ED for sudden abdominal and chest pain that occurred around 1:30 am this morning (04/18/13). The pain woke him from sleep.  The pain was substernal and epigastric, radiating to the back. He notes nausea and vomiting after the pain occurred. He reports fever, chills, and diaphoresis. He has never had anything like this before. Food consumption does not affect his pain. No alleviating or precipitating factors.  He was worked up by cardiology who ruled out cardiac etiology.  He continued to have uncontrolled abdominal pain in the ED which is why we were then consulted.  His US shows no evidence of cholecystitis, but the CT scan shows GB wall thickening, pericholecystic fluid, and stranding concerning for acalculous cholecystitis.  Labs were essentially normal.     Review of Systems  Constitutional: Positive for fever, chills and diaphoresis.  Respiratory: Negative for shortness of breath.   Cardiovascular: Positive for chest pain. Negative for leg swelling.  Gastrointestinal: Positive for nausea, vomiting and abdominal pain. Negative for diarrhea and constipation.  Genitourinary: Negative for dysuria.    Family History  Problem Relation Age of Onset  . Aortic aneurysm Father 60    ? abdominal  . Diabetes      PGaunt  . Coronary artery disease Mother     CABG in 98s  . Diabetes Brother   . Cancer Neg Hx   . CVA Mother     cns bleed from warfarin  . Heart attack Father 43    Past Medical History  Diagnosis Date  . CAD (coronary artery disease)     a.S/P CABG 12/00. b. cath 3/12: L-LAD ok, S-RI 95% tx with BMS/thrombectomy, S-AM-RCA ok, EF 60%.  Marland Kitchen HTN (hypertension)   . GERD (gastroesophageal reflux disease)   . Hyperlipidemia   . Hyperglycemia    . Vitamin D deficiency   . Colonic polyp     X2  . Peyronie disease   . Low testosterone     Dr Evelene Croon, [place] ,[place]  . Sinus bradycardia   . RBBB   . Diverticulitis 2008/2009    Past Surgical History  Procedure Laterality Date  . Colonoscopy w/ polypectomy  2004 & 2009     X 2; Dr Russella Dar; due 2014  . Coronary artery bypass graft  04/1999    4 vessels  . Vasectomy    . Coronary stent placement  2012    Plavix    Social History:  reports that he has never smoked. He has never used smokeless tobacco. He reports that he drinks about 12.0 ounces of alcohol per week. He reports that he does not use illicit drugs.  Allergies:  Allergies  Allergen Reactions  . Oxycodone-Acetaminophen     REACTION: RASH - tolerated morphine     (Not in a hospital admission)  Blood pressure 118/55, pulse 81, temperature 100 F (37.8 C), temperature source Oral, resp. rate 16, SpO2 96.00%. Physical Exam General: pleasant, WD/WN white male who is laying in bed in NAD HEENT: head is normocephalic, atraumatic.  Sclera are noninjected.  PERRL.  Ears and nose without any masses or lesions.  Mouth is pink and moist Heart: regular, rate, and rhythm.  Normal s1,s2. No obvious murmurs, gallops, or rubs noted.  Bilateral femoral pulses 2+,  DP pulses 2+ b/l Lungs: CTAB, no wheezes, rhonchi, or rales noted.  Respiratory effort nonlabored Abd: soft, diffuse tenderness to palpation in the epigastrium and RUQ, no distension, no guarding, +BS, no masses, hernias, or organomegaly. Negative Murphy's sign.  MS: all 4 extremities are symmetrical with no cyanosis, clubbing, or edema. Skin: warm and dry with no masses, lesions, or rashes Psych: A&Ox3 with an appropriate affect.   Results for orders placed during the hospital encounter of 04/18/13 (from the past 48 hour(s))  COMPREHENSIVE METABOLIC PANEL     Status: Abnormal   Collection Time    04/18/13  7:13 AM      Result Value Range   Sodium 137  135 - 145  mEq/L   Potassium 3.9  3.5 - 5.1 mEq/L   Chloride 101  96 - 112 mEq/L   CO2 25  19 - 32 mEq/L   Glucose, Bld 157 (*) 70 - 99 mg/dL   BUN 7  6 - 23 mg/dL   Creatinine, Ser 1.61  0.50 - 1.35 mg/dL   Calcium 9.1  8.4 - 09.6 mg/dL   Total Protein 6.7  6.0 - 8.3 g/dL   Albumin 4.0  3.5 - 5.2 g/dL   AST 32  0 - 37 U/L   ALT 22  0 - 53 U/L   Alkaline Phosphatase 47  39 - 117 U/L   Total Bilirubin 0.4  0.3 - 1.2 mg/dL   GFR calc non Af Amer 88 (*) >90 mL/min   GFR calc Af Amer >90  >90 mL/min   Comment: (NOTE)     The eGFR has been calculated using the CKD EPI equation.     This calculation has not been validated in all clinical situations.     eGFR's persistently <90 mL/min signify possible Chronic Kidney     Disease.  LIPASE, BLOOD     Status: None   Collection Time    04/18/13  7:13 AM      Result Value Range   Lipase 18  11 - 59 U/L  TROPONIN I     Status: None   Collection Time    04/18/13  7:13 AM      Result Value Range   Troponin I <0.30  <0.30 ng/mL   Comment:            Due to the release kinetics of cTnI,     a negative result within the first hours     of the onset of symptoms does not rule out     myocardial infarction with certainty.     If myocardial infarction is still suspected,     repeat the test at appropriate intervals.  CBC     Status: None   Collection Time    04/18/13  7:13 AM      Result Value Range   WBC 10.5  4.0 - 10.5 K/uL   RBC 4.49  4.22 - 5.81 MIL/uL   Hemoglobin 14.3  13.0 - 17.0 g/dL   HCT 04.5  40.9 - 81.1 %   MCV 92.2  78.0 - 100.0 fL   MCH 31.8  26.0 - 34.0 pg   MCHC 34.5  30.0 - 36.0 g/dL   RDW 91.4  78.2 - 95.6 %   Platelets 173  150 - 400 K/uL  PROTIME-INR     Status: None   Collection Time    04/18/13  7:13 AM      Result Value Range  Prothrombin Time 13.0  11.6 - 15.2 seconds   INR 1.00  0.00 - 1.49  POCT I-STAT TROPONIN I     Status: None   Collection Time    04/18/13  7:27 AM      Result Value Range   Troponin i, poc  0.00  0.00 - 0.08 ng/mL   Comment 3            Comment: Due to the release kinetics of cTnI,     a negative result within the first hours     of the onset of symptoms does not rule out     myocardial infarction with certainty.     If myocardial infarction is still suspected,     repeat the test at appropriate intervals.  URINALYSIS, ROUTINE W REFLEX MICROSCOPIC     Status: Abnormal   Collection Time    04/18/13  7:50 AM      Result Value Range   Color, Urine AMBER (*) YELLOW   Comment: BIOCHEMICALS MAY BE AFFECTED BY COLOR   APPearance HAZY (*) CLEAR   Specific Gravity, Urine 1.030  1.005 - 1.030   pH 5.0  5.0 - 8.0   Glucose, UA NEGATIVE  NEGATIVE mg/dL   Hgb urine dipstick NEGATIVE  NEGATIVE   Bilirubin Urine SMALL (*) NEGATIVE   Ketones, ur 15 (*) NEGATIVE mg/dL   Protein, ur 30 (*) NEGATIVE mg/dL   Urobilinogen, UA 0.2  0.0 - 1.0 mg/dL   Nitrite NEGATIVE  NEGATIVE   Leukocytes, UA NEGATIVE  NEGATIVE  URINE MICROSCOPIC-ADD ON     Status: Abnormal   Collection Time    04/18/13  7:50 AM      Result Value Range   Squamous Epithelial / LPF RARE  RARE   WBC, UA 0-2  <3 WBC/hpf   Bacteria, UA RARE  RARE   Casts HYALINE CASTS (*) NEGATIVE   Comment: GRANULAR CAST   Urine-Other MUCOUS PRESENT    TROPONIN I     Status: None   Collection Time    04/18/13 11:50 AM      Result Value Range   Troponin I <0.30  <0.30 ng/mL   Comment:            Due to the release kinetics of cTnI,     a negative result within the first hours     of the onset of symptoms does not rule out     myocardial infarction with certainty.     If myocardial infarction is still suspected,     repeat the test at appropriate intervals.   Dg Chest 2 View  04/18/2013   CLINICAL DATA:  Left chest pain, left flank pain.  EXAM: CHEST  2 VIEW  COMPARISON:  08/30/2010  FINDINGS: There is no focal parenchymal opacity, pleural effusion, or pneumothorax. The heart and mediastinal contours are unremarkable. There is  evidence of prior CABG.  The osseous structures are unremarkable.  IMPRESSION: No active cardiopulmonary disease.   Electronically Signed   By: Elige Ko   On: 04/18/2013 07:43   US Abdomen Complete  04/18/2013   CLINICAL DATA:  Epigastric and right upper abdominal pain and back pain.  EXAM: ULTRASOUND ABDOMEN COMPLETE  COMPARISON:  None.  FINDINGS: Gallbladder:  No gallstones or wall thickening visualized. No sonographic Murphy sign noted.  Common bile duct:  Diameter: 1.3 mm.  Liver:  No focal lesion identified. Within normal limits in parenchymal echogenicity.  IVC:  No abnormality visualized.  Pancreas:  The pancreas was obscured by bowel gas.  Spleen:  Size and appearance within normal limits.  Right Kidney:  Length: 13.1 cm. Echogenicity within normal limits. No mass or hydronephrosis visualized.  Left Kidney:  Length: 12.4 cm. Echogenicity within normal limits. There are parapelvic cysts. There is no evidence of hydronephrosis.  Abdominal aorta:  Bowel gas obscures the midportion of the abdominal aorta. The maximal diameter of the abdominal aorta is seen. Proximally it measures 2.9 cm.  Other findings:  No ascites is demonstrated.  IMPRESSION: 1. The gallbladder is normal in appearance with no evidence of stones nor findings to suggest acute cholecystitis. The common bile duct is normal in caliber. 2. The liver, spleen, and kidneys exhibit no acute abnormalities. The pancreas is obscured by bowel gas. 3. The visualized portions of the abdominal aorta appear normal.   Electronically Signed   By: David  [place]   On: 04/18/2013 09:49   Ct Angio Chest Aortic Dissect W &/or W/o  04/18/2013   CLINICAL DATA:  67 year old with abdominal pain.  EXAM: CT ANGIOGRAPHY CHEST, ABDOMEN AND PELVIS  TECHNIQUE: Multidetector CT imaging through the chest, abdomen and pelvis was performed using the standard protocol during bolus administration of intravenous contrast. Multiplanar reconstructed images including MIPs were  obtained and reviewed to evaluate the vascular anatomy.  CONTRAST:  OMNIPAQUE IOHEXOL 350 MG/ML SOLN  COMPARISON:  Ultrasound 04/18/2013  FINDINGS: CTA CHEST FINDINGS  Negative for an aortic dissection or intramural hematoma. Patient had a median sternotomy and CABG procedure. Ascending thoracic aorta is prominent for size measuring up to 4 cm. The great vessels are patent. Descending thoracic aorta measures up to 3.4 cm. The pulmonary artery is slightly ectatic but no evidence for large filling defects in the main pulmonary arteries. No evidence for mediastinal, hilar or axillary lymphadenopathy. No significant pericardial or pleural fluid. Mild dilatation of the distal esophagus. The trachea and mainstem bronchi are patent. Both lungs are clear without airspace disease or consolidation. No acute bone abnormality.  Review of the MIP images confirms the above findings.  CTA ABDOMEN AND PELVIS FINDINGS  The abdominal aorta is patent without dissection or aneurysm. There is flow in the celiac trunk, SMA and inferior mesenteric artery. Atherosclerotic calcifications in the abdominal aorta. The patient has bilateral accessory renal arteries. There is plaque and at least mild stenosis at the origin of the main left renal artery. Incidentally, the left gastric artery originates from the aorta and just above the celiac trunk. Dilatation of the proximal right common iliac artery measuring 1.7 cm. There is a small amount of irregular plaque in the right common iliac artery and this could represent a 0.5 cm penetrating ulcer. Right external iliac arteries are patent. Proximal right femoral arteries are patent. Dilatation of the left common iliac artery measuring 1.6 cm. There is occlusion of the proximal left internal iliac iliac artery just beyond the origin with distal reconstitution of the left internal iliac branches. Left external iliac artery is widely patent. Proximal left femoral arteries are patent.  The  gallbladder is distended and there is a small amount of fluid along the inferior aspect of the gallbladder. No definite gallstones. Limited evaluation of the portal venous system on this arterial study. There is concern for a trace amount of fluid around the spleen. No gross abnormality to the stomach or duodenum. No gross abnormality to the pancreas. No clear evidence for biliary or pancreatic duct dilatation. Normal appearance of both adrenal glands. Exophytic cystic  structure along the medial aspect of the right kidney measures 1.9 cm. Few small densities within this cystic structure could represent small septations. Multiple low-density structures associated with the left kidney probably represent parapelvic and cortical cysts. Incidentally, the patient has a retroaortic left renal vein. No significant lymphadenopathy. Normal appearance of the prostate and urinary bladder. Small left inguinal hernia containing fat. Diverticulosis in the sigmoid colon and left colon. Normal appearance of the terminal ileum. The appendix may have been surgically removed. No gross abnormality to the small or large bowel. No acute bone abnormalities.  Review of the MIP images confirms the above findings.  IMPRESSION: The gallbladder is distended with mild stranding around the gallbladder. Findings are concerning for acute acalculous cholecystitis based on the prior ultrasound findings.  Negative for aortic dissection. Mild dilatation of the ascending thoracic aorta.  Mild narrowing of the main left renal artery. Patient has bilateral accessory renal arteries.  Focal irregular plaque in the right common iliac artery. Findings could represent a small penetrating ulcer. This area roughly measures 0.5 cm.  Proximal occlusion of the left internal iliac artery.  Bilateral renal cysts. Exophytic cyst in the right kidney may contain septations. This area was not specifically evaluated on the recent ultrasound and consider a follow-up renal  ultrasound to follow this cystic structure.   Electronically Signed   By: Richarda Overlie M.D.   On: 04/18/2013 13:22   Ct Angio Abd/pel W/ And/or W/o  04/18/2013   CLINICAL DATA:  67 year old with abdominal pain.  EXAM: CT ANGIOGRAPHY CHEST, ABDOMEN AND PELVIS  TECHNIQUE: Multidetector CT imaging through the chest, abdomen and pelvis was performed using the standard protocol during bolus administration of intravenous contrast. Multiplanar reconstructed images including MIPs were obtained and reviewed to evaluate the vascular anatomy.  CONTRAST:  OMNIPAQUE IOHEXOL 350 MG/ML SOLN  COMPARISON:  Ultrasound 04/18/2013  FINDINGS: CTA CHEST FINDINGS  Negative for an aortic dissection or intramural hematoma. Patient had a median sternotomy and CABG procedure. Ascending thoracic aorta is prominent for size measuring up to 4 cm. The great vessels are patent. Descending thoracic aorta measures up to 3.4 cm. The pulmonary artery is slightly ectatic but no evidence for large filling defects in the main pulmonary arteries. No evidence for mediastinal, hilar or axillary lymphadenopathy. No significant pericardial or pleural fluid. Mild dilatation of the distal esophagus. The trachea and mainstem bronchi are patent. Both lungs are clear without airspace disease or consolidation. No acute bone abnormality.  Review of the MIP images confirms the above findings.  CTA ABDOMEN AND PELVIS FINDINGS  The abdominal aorta is patent without dissection or aneurysm. There is flow in the celiac trunk, SMA and inferior mesenteric artery. Atherosclerotic calcifications in the abdominal aorta. The patient has bilateral accessory renal arteries. There is plaque and at least mild stenosis at the origin of the main left renal artery. Incidentally, the left gastric artery originates from the aorta and just above the celiac trunk. Dilatation of the proximal right common iliac artery measuring 1.7 cm. There is a small amount of irregular plaque in  the right common iliac artery and this could represent a 0.5 cm penetrating ulcer. Right external iliac arteries are patent. Proximal right femoral arteries are patent. Dilatation of the left common iliac artery measuring 1.6 cm. There is occlusion of the proximal left internal iliac iliac artery just beyond the origin with distal reconstitution of the left internal iliac branches. Left external iliac artery is widely patent. Proximal left femoral arteries  are patent.  The gallbladder is distended and there is a small amount of fluid along the inferior aspect of the gallbladder. No definite gallstones. Limited evaluation of the portal venous system on this arterial study. There is concern for a trace amount of fluid around the spleen. No gross abnormality to the stomach or duodenum. No gross abnormality to the pancreas. No clear evidence for biliary or pancreatic duct dilatation. Normal appearance of both adrenal glands. Exophytic cystic structure along the medial aspect of the right kidney measures 1.9 cm. Few small densities within this cystic structure could represent small septations. Multiple low-density structures associated with the left kidney probably represent parapelvic and cortical cysts. Incidentally, the patient has a retroaortic left renal vein. No significant lymphadenopathy. Normal appearance of the prostate and urinary bladder. Small left inguinal hernia containing fat. Diverticulosis in the sigmoid colon and left colon. Normal appearance of the terminal ileum. The appendix may have been surgically removed. No gross abnormality to the small or large bowel. No acute bone abnormalities.  Review of the MIP images confirms the above findings.  IMPRESSION: The gallbladder is distended with mild stranding around the gallbladder. Findings are concerning for acute acalculous cholecystitis based on the prior ultrasound findings.  Negative for aortic dissection. Mild dilatation of the ascending thoracic  aorta.  Mild narrowing of the main left renal artery. Patient has bilateral accessory renal arteries.  Focal irregular plaque in the right common iliac artery. Findings could represent a small penetrating ulcer. This area roughly measures 0.5 cm.  Proximal occlusion of the left internal iliac artery.  Bilateral renal cysts. Exophytic cyst in the right kidney may contain septations. This area was not specifically evaluated on the recent ultrasound and consider a follow-up renal ultrasound to follow this cystic structure.   Electronically Signed   By: Richarda Overlie M.D.   On: 04/18/2013 13:22       Assessment/Plan Abdominal pain, RUQ and Epigastric Nausea/Vomiting Gallbladder distension  We will order a HIDA scan to assess for cholecystitis. IVF, pain control, and antiemetics. He has to be NPO and off pain medications before HIDA scan. He has been on plavix and ASA and will require at least three days before surgical intervention. He will be admitted to the medicine service. We will follow. Repeat labs in AM.    Maris Berger PA-S General Surgery  04/18/2013, 4:21 PM Pager: [phone]

## 2013-04-18 NOTE — ED Notes (Signed)
Patient states no chest pain at this time.

## 2013-04-18 NOTE — H&P (Addendum)
[facility] History and Physical  HENRRY FEIL MVH:[id number] DOB: [birth year] DOA: 04/18/2013  Referring physician: er PCP: Marga Melnick, MD  Specialists: surgery/cardiology  Chief Complaint: abd/chest pain  HPI: Kevin Webb is a 67 y.o. male  Who is in relatively good health- does have a significant cardiac history until he woke this AM with chest and abd pain.  The pain woke him up.  It is both substernal and epigastric with radiation into his back.  He also has RUQ pain.  + fever and chills.  He thinks his epigastric pain is related to his GERD.  Can not tell if food makes it better or worse as he has not eaten.   In the ER, he was seen by cardiology- 3 sets of CE were negative; CT scan was done that showed: The gallbladder is distended with mild stranding around the gallbladder. Findings are concerning for acute acalculous  cholecystitis based on the prior ultrasound findings. Negative for aortic dissection. Mild dilatation of the ascending  thoracic aorta. Mild narrowing of the main left renal artery. Patient has bilateral accessory renal arteries.  Focal irregular plaque in the right common iliac artery. Findings could represent a small penetrating ulcer. This area roughly  measures 0.5 cm. Proximal occlusion of the left internal iliac artery. Bilateral renal cysts. Exophytic cyst in the right kidney may contain septations. This area was not specifically evaluated on the recent ultrasound and consider a follow-up renal ultrasound to follow this cystic structure.  Surgery was consulted in the ER, and recommended a medical admission and they will evaluate.  LFTs WNL   Review of Systems: all systems reviewed, negative unless stated above   Past Medical History  Diagnosis Date  . CAD (coronary artery disease)     a.S/P CABG 12/00. b. cath 3/12: L-LAD ok, S-RI 95% tx with BMS/thrombectomy, S-AM-RCA ok, EF 60%.  Marland Kitchen HTN (hypertension)   . GERD (gastroesophageal reflux disease)    . Hyperlipidemia   . Hyperglycemia   . Vitamin D deficiency   . Colonic polyp     X2  . Peyronie disease   . Low testosterone     Dr Evelene Croon, [place] ,[place]  . Sinus bradycardia   . RBBB   . Diverticulitis 2008/2009   Past Surgical History  Procedure Laterality Date  . Colonoscopy w/ polypectomy  2004 & 2009     X 2; Dr Russella Dar; due 2014  . Coronary artery bypass graft  04/1999    4 vessels  . Vasectomy    . Coronary stent placement  2012    Plavix   Social History:  reports that he has never smoked. He has never used smokeless tobacco. He reports that he drinks about 12.0 ounces of alcohol per week. He reports that he does not use illicit drugs.   Allergies  Allergen Reactions  . Oxycodone-Acetaminophen     REACTION: RASH - tolerated morphine    Family History  Problem Relation Age of Onset  . Aortic aneurysm Father 60    ? abdominal  . Diabetes      PGaunt  . Coronary artery disease Mother     CABG in 69s  . Diabetes Brother   . Cancer Neg Hx   . CVA Mother     cns bleed from warfarin  . Heart attack Father 40    Prior to Admission medications   Medication Sig Start Date End Date Taking? Authorizing Provider  Ascorbic Acid (VITAMIN C) 1000 MG tablet  Take 1,000 mg by mouth daily.     Yes Historical Provider, MD  aspirin 325 MG tablet Take 325 mg by mouth daily.     Yes Historical Provider, MD  Bioflavonoid Products (GRAPE SEED PO) Take 1 capsule by mouth daily.   Yes Historical Provider, MD  Cholecalciferol (VITAMIN D3) 5000 UNITS CAPS Take by mouth daily.   Yes Historical Provider, MD  clonazePAM (KLONOPIN) 0.5 MG tablet 1 qhs prn for restless legs 10/31/12  Yes Pecola Lawless, MD  clopidogrel (PLAVIX) 75 MG tablet Take 1 tablet (75 mg total) by mouth daily. 01/24/13  Yes Rollene Rotunda, MD  co-enzyme Q-10 30 MG capsule Take 30 mg by mouth daily.    Yes Historical Provider, MD  metoprolol tartrate (LOPRESSOR) 25 MG tablet Take 0.5 tablets (12.5 mg total) by mouth  2 (two) times daily. 03/14/13  Yes Laurey Morale, MD  Multiple Vitamin (MULTIVITAMIN) tablet Take 1 tablet by mouth daily.     Yes Historical Provider, MD  niacin (NIASPAN) 1000 MG CR tablet Take 1 tablet (1,000 mg total) by mouth at bedtime. 01/24/13  Yes Rollene Rotunda, MD  nitroGLYCERIN (NITROSTAT) 0.4 MG SL tablet Place 1 tablet (0.4 mg total) under the tongue every 5 (five) minutes as needed. 06/13/12  Yes Pecola Lawless, MD  Omega-3 Fatty Acids (FISH OIL) 1000 MG CAPS 1 tab po qd    Yes Historical Provider, MD  RABEprazole (ACIPHEX) 20 MG tablet Take 1 tablet (20 mg total) by mouth daily. 12/27/12  Yes Pecola Lawless, MD  ramipril (ALTACE) 10 MG capsule Take 1 capsule (10 mg total) by mouth daily. 01/23/13  Yes Pecola Lawless, MD  rosuvastatin (CRESTOR) 40 MG tablet Take 1 tablet (40 mg total) by mouth daily. 02/28/13  Yes Rollene Rotunda, MD  Saw Palmetto, Serenoa repens, (SAW PALMETTO PO) Take 1 capsule by mouth daily.   Yes Historical Provider, MD  SELENIUM PO Take 1 tablet by mouth daily.   Yes Historical Provider, MD  tadalafil (CIALIS) 20 MG tablet Take 20 mg by mouth daily.   Yes Historical Provider, MD  valACYclovir (VALTREX) 500 MG tablet Take 1 tablet (500 mg total) by mouth daily. 04/02/11  Yes Pecola Lawless, MD  vitamin E 100 UNIT capsule Take 100 Units by mouth daily.     Yes Historical Provider, MD   Physical Exam: Filed Vitals:   04/18/13 1530  BP: 118/55  Pulse: 81  Temp:   Resp: 16     General:  A+Ox3, resting comfortable while still  Eyes: wnl  ENT: wnl  Neck: supple  Cardiovascular: rrr  Respiratory: clear anterior  Abdomen: +BS, soft, tenderness in epigastrium and RUQ  Skin: no rashes or lesions  Musculoskeletal: moves all 4 ext  Psychiatric: normal mood/affect  Neurologic: CN 2-12 intact  Labs on Admission:  Basic Metabolic Panel:  Recent Labs Lab 04/18/13 0713  NA 137  K 3.9  CL 101  CO2 25  GLUCOSE 157*  BUN 7  CREATININE 0.89   CALCIUM 9.1   Liver Function Tests:  Recent Labs Lab 04/18/13 0713  AST 32  ALT 22  ALKPHOS 47  BILITOT 0.4  PROT 6.7  ALBUMIN 4.0    Recent Labs Lab 04/18/13 0713  LIPASE 18   No results found for this basename: AMMONIA,  in the last 168 hours CBC:  Recent Labs Lab 04/18/13 0713  WBC 10.5  HGB 14.3  HCT 41.4  MCV 92.2  PLT 173  Cardiac Enzymes:  Recent Labs Lab 04/18/13 0713 04/18/13 1150 04/18/13 1538  TROPONINI <0.30 <0.30 <0.30    BNP (last 3 results) No results found for this basename: PROBNP,  in the last 8760 hours CBG: No results found for this basename: GLUCAP,  in the last 168 hours  Radiological Exams on Admission: Dg Chest 2 View  04/18/2013   CLINICAL DATA:  Left chest pain, left flank pain.  EXAM: CHEST  2 VIEW  COMPARISON:  08/30/2010  FINDINGS: There is no focal parenchymal opacity, pleural effusion, or pneumothorax. The heart and mediastinal contours are unremarkable. There is evidence of prior CABG.  The osseous structures are unremarkable.  IMPRESSION: No active cardiopulmonary disease.   Electronically Signed   By: Elige Ko   On: 04/18/2013 07:43   US Abdomen Complete  04/18/2013   CLINICAL DATA:  Epigastric and right upper abdominal pain and back pain.  EXAM: ULTRASOUND ABDOMEN COMPLETE  COMPARISON:  None.  FINDINGS: Gallbladder:  No gallstones or wall thickening visualized. No sonographic Murphy sign noted.  Common bile duct:  Diameter: 1.3 mm.  Liver:  No focal lesion identified. Within normal limits in parenchymal echogenicity.  IVC:  No abnormality visualized.  Pancreas:  The pancreas was obscured by bowel gas.  Spleen:  Size and appearance within normal limits.  Right Kidney:  Length: 13.1 cm. Echogenicity within normal limits. No mass or hydronephrosis visualized.  Left Kidney:  Length: 12.4 cm. Echogenicity within normal limits. There are parapelvic cysts. There is no evidence of hydronephrosis.  Abdominal aorta:  Bowel gas  obscures the midportion of the abdominal aorta. The maximal diameter of the abdominal aorta is seen. Proximally it measures 2.9 cm.  Other findings:  No ascites is demonstrated.  IMPRESSION: 1. The gallbladder is normal in appearance with no evidence of stones nor findings to suggest acute cholecystitis. The common bile duct is normal in caliber. 2. The liver, spleen, and kidneys exhibit no acute abnormalities. The pancreas is obscured by bowel gas. 3. The visualized portions of the abdominal aorta appear normal.   Electronically Signed   By: David  [place]   On: 04/18/2013 09:49   Ct Angio Chest Aortic Dissect W &/or W/o  04/18/2013   CLINICAL DATA:  67 year old with abdominal pain.  EXAM: CT ANGIOGRAPHY CHEST, ABDOMEN AND PELVIS  TECHNIQUE: Multidetector CT imaging through the chest, abdomen and pelvis was performed using the standard protocol during bolus administration of intravenous contrast. Multiplanar reconstructed images including MIPs were obtained and reviewed to evaluate the vascular anatomy.  CONTRAST:  OMNIPAQUE IOHEXOL 350 MG/ML SOLN  COMPARISON:  Ultrasound 04/18/2013  FINDINGS: CTA CHEST FINDINGS  Negative for an aortic dissection or intramural hematoma. Patient had a median sternotomy and CABG procedure. Ascending thoracic aorta is prominent for size measuring up to 4 cm. The great vessels are patent. Descending thoracic aorta measures up to 3.4 cm. The pulmonary artery is slightly ectatic but no evidence for large filling defects in the main pulmonary arteries. No evidence for mediastinal, hilar or axillary lymphadenopathy. No significant pericardial or pleural fluid. Mild dilatation of the distal esophagus. The trachea and mainstem bronchi are patent. Both lungs are clear without airspace disease or consolidation. No acute bone abnormality.  Review of the MIP images confirms the above findings.  CTA ABDOMEN AND PELVIS FINDINGS  The abdominal aorta is patent without dissection or aneurysm.  There is flow in the celiac trunk, SMA and inferior mesenteric artery. Atherosclerotic calcifications in the abdominal aorta. The  patient has bilateral accessory renal arteries. There is plaque and at least mild stenosis at the origin of the main left renal artery. Incidentally, the left gastric artery originates from the aorta and just above the celiac trunk. Dilatation of the proximal right common iliac artery measuring 1.7 cm. There is a small amount of irregular plaque in the right common iliac artery and this could represent a 0.5 cm penetrating ulcer. Right external iliac arteries are patent. Proximal right femoral arteries are patent. Dilatation of the left common iliac artery measuring 1.6 cm. There is occlusion of the proximal left internal iliac iliac artery just beyond the origin with distal reconstitution of the left internal iliac branches. Left external iliac artery is widely patent. Proximal left femoral arteries are patent.  The gallbladder is distended and there is a small amount of fluid along the inferior aspect of the gallbladder. No definite gallstones. Limited evaluation of the portal venous system on this arterial study. There is concern for a trace amount of fluid around the spleen. No gross abnormality to the stomach or duodenum. No gross abnormality to the pancreas. No clear evidence for biliary or pancreatic duct dilatation. Normal appearance of both adrenal glands. Exophytic cystic structure along the medial aspect of the right kidney measures 1.9 cm. Few small densities within this cystic structure could represent small septations. Multiple low-density structures associated with the left kidney probably represent parapelvic and cortical cysts. Incidentally, the patient has a retroaortic left renal vein. No significant lymphadenopathy. Normal appearance of the prostate and urinary bladder. Small left inguinal hernia containing fat. Diverticulosis in the sigmoid colon and left colon.  Normal appearance of the terminal ileum. The appendix may have been surgically removed. No gross abnormality to the small or large bowel. No acute bone abnormalities.  Review of the MIP images confirms the above findings.  IMPRESSION: The gallbladder is distended with mild stranding around the gallbladder. Findings are concerning for acute acalculous cholecystitis based on the prior ultrasound findings.  Negative for aortic dissection. Mild dilatation of the ascending thoracic aorta.  Mild narrowing of the main left renal artery. Patient has bilateral accessory renal arteries.  Focal irregular plaque in the right common iliac artery. Findings could represent a small penetrating ulcer. This area roughly measures 0.5 cm.  Proximal occlusion of the left internal iliac artery.  Bilateral renal cysts. Exophytic cyst in the right kidney may contain septations. This area was not specifically evaluated on the recent ultrasound and consider a follow-up renal ultrasound to follow this cystic structure.   Electronically Signed   By: Richarda Overlie M.D.   On: 04/18/2013 13:22   Ct Angio Abd/pel W/ And/or W/o  04/18/2013   CLINICAL DATA:  67 year old with abdominal pain.  EXAM: CT ANGIOGRAPHY CHEST, ABDOMEN AND PELVIS  TECHNIQUE: Multidetector CT imaging through the chest, abdomen and pelvis was performed using the standard protocol during bolus administration of intravenous contrast. Multiplanar reconstructed images including MIPs were obtained and reviewed to evaluate the vascular anatomy.  CONTRAST:  OMNIPAQUE IOHEXOL 350 MG/ML SOLN  COMPARISON:  Ultrasound 04/18/2013  FINDINGS: CTA CHEST FINDINGS  Negative for an aortic dissection or intramural hematoma. Patient had a median sternotomy and CABG procedure. Ascending thoracic aorta is prominent for size measuring up to 4 cm. The great vessels are patent. Descending thoracic aorta measures up to 3.4 cm. The pulmonary artery is slightly ectatic but no evidence for large  filling defects in the main pulmonary arteries. No evidence for mediastinal, hilar or  axillary lymphadenopathy. No significant pericardial or pleural fluid. Mild dilatation of the distal esophagus. The trachea and mainstem bronchi are patent. Both lungs are clear without airspace disease or consolidation. No acute bone abnormality.  Review of the MIP images confirms the above findings.  CTA ABDOMEN AND PELVIS FINDINGS  The abdominal aorta is patent without dissection or aneurysm. There is flow in the celiac trunk, SMA and inferior mesenteric artery. Atherosclerotic calcifications in the abdominal aorta. The patient has bilateral accessory renal arteries. There is plaque and at least mild stenosis at the origin of the main left renal artery. Incidentally, the left gastric artery originates from the aorta and just above the celiac trunk. Dilatation of the proximal right common iliac artery measuring 1.7 cm. There is a small amount of irregular plaque in the right common iliac artery and this could represent a 0.5 cm penetrating ulcer. Right external iliac arteries are patent. Proximal right femoral arteries are patent. Dilatation of the left common iliac artery measuring 1.6 cm. There is occlusion of the proximal left internal iliac iliac artery just beyond the origin with distal reconstitution of the left internal iliac branches. Left external iliac artery is widely patent. Proximal left femoral arteries are patent.  The gallbladder is distended and there is a small amount of fluid along the inferior aspect of the gallbladder. No definite gallstones. Limited evaluation of the portal venous system on this arterial study. There is concern for a trace amount of fluid around the spleen. No gross abnormality to the stomach or duodenum. No gross abnormality to the pancreas. No clear evidence for biliary or pancreatic duct dilatation. Normal appearance of both adrenal glands. Exophytic cystic structure along the medial  aspect of the right kidney measures 1.9 cm. Few small densities within this cystic structure could represent small septations. Multiple low-density structures associated with the left kidney probably represent parapelvic and cortical cysts. Incidentally, the patient has a retroaortic left renal vein. No significant lymphadenopathy. Normal appearance of the prostate and urinary bladder. Small left inguinal hernia containing fat. Diverticulosis in the sigmoid colon and left colon. Normal appearance of the terminal ileum. The appendix may have been surgically removed. No gross abnormality to the small or large bowel. No acute bone abnormalities.  Review of the MIP images confirms the above findings.  IMPRESSION: The gallbladder is distended with mild stranding around the gallbladder. Findings are concerning for acute acalculous cholecystitis based on the prior ultrasound findings.  Negative for aortic dissection. Mild dilatation of the ascending thoracic aorta.  Mild narrowing of the main left renal artery. Patient has bilateral accessory renal arteries.  Focal irregular plaque in the right common iliac artery. Findings could represent a small penetrating ulcer. This area roughly measures 0.5 cm.  Proximal occlusion of the left internal iliac artery.  Bilateral renal cysts. Exophytic cyst in the right kidney may contain septations. This area was not specifically evaluated on the recent ultrasound and consider a follow-up renal ultrasound to follow this cystic structure.   Electronically Signed   By: Richarda Overlie M.D.   On: 04/18/2013 13:22    EKG: Independently reviewed. Sinus brady, with rbbb  Assessment/Plan Active Problems:   Chest pain   Abdominal pain   Gallbladder disorder   Alcohol abuse, daily use   1. Chest pain- cards following, CE negative 2. abd pain- with ? Cholecystitis on CT scan- +fevers and chills so will start zosyn- clinical picture not clear as LFTs normal, lipase normal- surgery to see-  HIDA  ordered 3. Daily alcohol use- CIWA 4. CAD- stable, cards following, holding ASA and plavix   Code Status: full Family Communication: patient Disposition Plan: admit  Time spent: 75 min  Winry Egnew [facility] Pager [phone]  If 7PM-7AM, please contact night-coverage [email or web address] Password [email or web address] 04/18/2013, 5:10 PM

## 2013-04-18 NOTE — ED Notes (Signed)
Pt states he is having pain all over his chest and abdomen. States it started at 0130 and took peptobismal and alleve with no relief.

## 2013-04-19 ENCOUNTER — Encounter (HOSPITAL_COMMUNITY): Payer: Self-pay | Admitting: General Practice

## 2013-04-19 DIAGNOSIS — R1011 Right upper quadrant pain: Secondary | ICD-10-CM | POA: Diagnosis not present

## 2013-04-19 DIAGNOSIS — K810 Acute cholecystitis: Secondary | ICD-10-CM | POA: Diagnosis not present

## 2013-04-19 DIAGNOSIS — R112 Nausea with vomiting, unspecified: Secondary | ICD-10-CM | POA: Diagnosis not present

## 2013-04-19 DIAGNOSIS — R109 Unspecified abdominal pain: Secondary | ICD-10-CM | POA: Diagnosis not present

## 2013-04-19 DIAGNOSIS — I25810 Atherosclerosis of coronary artery bypass graft(s) without angina pectoris: Secondary | ICD-10-CM | POA: Diagnosis not present

## 2013-04-19 LAB — COMPREHENSIVE METABOLIC PANEL
ALT: 18 U/L (ref 0–53)
AST: 31 U/L (ref 0–37)
BUN: 12 mg/dL (ref 6–23)
Calcium: 8.4 mg/dL (ref 8.4–10.5)
GFR calc Af Amer: >90 mL/min (ref >90)
Glucose, Bld: 126 mg/dL — ABNORMAL HIGH (ref 70–99)
Sodium: 136 mEq/L (ref 135–145)
Total Protein: 5.8 g/dL — ABNORMAL LOW (ref 6.0–8.3)

## 2013-04-19 LAB — CBC
HCT: 39.4 % (ref 39.0–52.0)
Hemoglobin: 13.6 g/dL (ref 13.0–17.0)
MCH: 31.6 pg (ref 26.0–34.0)
MCHC: 34.5 g/dL (ref 30.0–36.0)
Platelets: 115 10*3/uL — ABNORMAL LOW (ref 150–400)

## 2013-04-19 MED ORDER — INFLUENZA VAC SPLIT QUAD 0.5 ML IM SUSP: 0.5000 mL | INTRAMUSCULAR | Status: DC | PRN

## 2013-04-19 MED ORDER — METOPROLOL TARTRATE 12.5 MG HALF TABLET
12.5000 mg | ORAL_TABLET | Freq: Two times a day (BID) | ORAL | Status: DC
  Administered 2013-04-20 – 2013-04-22 (×4): 12.5 mg via ORAL
  Filled 2013-04-19 (×8): qty 1

## 2013-04-19 MED ORDER — CHLORDIAZEPOXIDE HCL 5 MG PO CAPS: 10.0000 mg | ORAL_CAPSULE | Freq: Three times a day (TID) | ORAL | Status: DC

## 2013-04-19 MED ORDER — CHLORDIAZEPOXIDE HCL 5 MG PO CAPS
10.0000 mg | ORAL_CAPSULE | Freq: Three times a day (TID) | ORAL | Status: DC
  Administered 2013-04-19 – 2013-04-22 (×2): 10 mg via ORAL
  Filled 2013-04-19 (×3): qty 2

## 2013-04-19 MED ORDER — PNEUMOCOCCAL VAC POLYVALENT 25 MCG/0.5ML IJ INJ: 0.5000 mL | INJECTION | INTRAMUSCULAR | Status: DC | PRN

## 2013-04-19 MED ORDER — ATORVASTATIN CALCIUM 10 MG PO TABS
10.0000 mg | ORAL_TABLET | Freq: Every day | ORAL | Status: DC
  Administered 2013-04-19 – 2013-04-20 (×2): 10 mg via ORAL
  Filled 2013-04-19 (×4): qty 1

## 2013-04-19 MED ORDER — INFLUENZA VAC SPLIT QUAD 0.5 ML IM SUSP
0.5000 mL | INTRAMUSCULAR | Status: DC
  Filled 2013-04-19: qty 0.5

## 2013-04-19 MED ORDER — POTASSIUM CHLORIDE CRYS ER 20 MEQ PO TBCR
40.0000 meq | EXTENDED_RELEASE_TABLET | Freq: Once | ORAL | Status: AC
  Administered 2013-04-19: 40 meq via ORAL
  Filled 2013-04-19: qty 2

## 2013-04-19 MED ORDER — NITROGLYCERIN 0.4 MG SL SUBL: 0.4000 mg | SUBLINGUAL_TABLET | SUBLINGUAL | Status: DC | PRN

## 2013-04-19 MED ORDER — POTASSIUM CHLORIDE IN NACL 20-0.9 MEQ/L-% IV SOLN
INTRAVENOUS | Status: DC
  Administered 2013-04-19: 14:00:00 via INTRAVENOUS
  Filled 2013-04-19 (×2): qty 1000

## 2013-04-19 MED ORDER — RAMIPRIL 10 MG PO CAPS
10.0000 mg | ORAL_CAPSULE | Freq: Every day | ORAL | Status: DC
  Administered 2013-04-20 – 2013-04-22 (×2): 10 mg via ORAL
  Filled 2013-04-19 (×4): qty 1

## 2013-04-19 MED ORDER — MORPHINE SULFATE 2 MG/ML IJ SOLN
2.0000 mg | INTRAMUSCULAR | Status: DC | PRN
  Administered 2013-04-19 (×3): 2 mg via INTRAVENOUS
  Filled 2013-04-19 (×3): qty 1

## 2013-04-19 MED ORDER — VALACYCLOVIR HCL 500 MG PO TABS
500.0000 mg | ORAL_TABLET | Freq: Every day | ORAL | Status: DC
  Administered 2013-04-22: 500 mg via ORAL
  Filled 2013-04-19 (×4): qty 1

## 2013-04-19 MED ORDER — PANTOPRAZOLE SODIUM 40 MG PO TBEC
40.0000 mg | DELAYED_RELEASE_TABLET | Freq: Every day | ORAL | Status: DC
  Administered 2013-04-20 – 2013-04-22 (×2): 40 mg via ORAL
  Filled 2013-04-19 (×2): qty 1

## 2013-04-19 MED ORDER — INFLUENZA VAC SPLIT QUAD 0.5 ML IM SUSP: 0.5000 mL | INTRAMUSCULAR | Status: DC

## 2013-04-19 NOTE — Consult Note (Signed)
Patient seen yesterday, but note did not appear in Epic for me to co-sign till now. Agree with plans as noted by MD,PA

## 2013-04-19 NOTE — Progress Notes (Signed)
<principal problem not specified>  Assessment: Apparent acute cholecystitis by HIDA Last plavix yesterday  Plan: Will plan cholecystectomy on Friday, should be adequate time off Plavix to do   Subjective: Still with RUQ pain, maybe a little better.  Objective: Vital signs in last 24 hours: Temp:  [97.6 F (36.4 C)-101.3 F (38.5 C)] 97.6 F (36.4 C) (12/03 0533) Pulse Rate:  [55-107] 95 (12/03 0533) Resp:  [12-40] 20 (12/03 0533) BP: (104-147)/(47-80) 106/56 mmHg (12/03 0533) SpO2:  [94 %-100 %] 96 % (12/03 0533) Weight:  [197 lb 3.2 oz (89.449 kg)-210 lb 15.7 oz (95.7 kg)] 197 lb 3.2 oz (89.449 kg) (12/02 1804) Last BM Date: 04/17/13  Intake/Output from previous day: 12/02 0701 - 12/03 0700 In: 900 [I.V.:800; IV Piggyback:100] Out: 100 [Urine:100]  General appearance: alert, cooperative and mild distress GI: Still tender in RUQ, rest of abd soft, no peritoneal signs  Lab Results:  Results for orders placed during the hospital encounter of 04/18/13 (from the past 24 hour(s))  TROPONIN I     Status: None   Collection Time    04/18/13 11:50 AM      Result Value Range   Troponin I <0.30  <0.30 ng/mL  HEMOGLOBIN A1C     Status: Abnormal   Collection Time    04/18/13 11:51 AM      Result Value Range   Hemoglobin A1C 6.0 (*) <5.7 %   Mean Plasma Glucose 126 (*) <117 mg/dL  TROPONIN I     Status: None   Collection Time    04/18/13  3:38 PM      Result Value Range   Troponin I <0.30  <0.30 ng/mL  CBC     Status: Abnormal   Collection Time    04/19/13  5:00 AM      Result Value Range   WBC 5.7  4.0 - 10.5 K/uL   RBC 4.30  4.22 - 5.81 MIL/uL   Hemoglobin 13.6  13.0 - 17.0 g/dL   HCT 54.0  98.1 - 19.1 %   MCV 91.6  78.0 - 100.0 fL   MCH 31.6  26.0 - 34.0 pg   MCHC 34.5  30.0 - 36.0 g/dL   RDW 47.8  29.5 - 62.1 %   Platelets 115 (*) 150 - 400 K/uL  COMPREHENSIVE METABOLIC PANEL     Status: Abnormal   Collection Time    04/19/13  5:00 AM      Result Value Range    Sodium 136  135 - 145 mEq/L   Potassium 3.3 (*) 3.5 - 5.1 mEq/L   Chloride 101  96 - 112 mEq/L   CO2 23  19 - 32 mEq/L   Glucose, Bld 126 (*) 70 - 99 mg/dL   BUN 12  6 - 23 mg/dL   Creatinine, Ser 3.08  0.50 - 1.35 mg/dL   Calcium 8.4  8.4 - 65.7 mg/dL   Total Protein 5.8 (*) 6.0 - 8.3 g/dL   Albumin 3.1 (*) 3.5 - 5.2 g/dL   AST 31  0 - 37 U/L   ALT 18  0 - 53 U/L   Alkaline Phosphatase 57  39 - 117 U/L   Total Bilirubin 1.3 (*) 0.3 - 1.2 mg/dL   GFR calc non Af Amer 84 (*) >90 mL/min   GFR calc Af Amer >90  >90 mL/min     Studies/Results Radiology     MEDS, Scheduled . enoxaparin (LOVENOX) injection  40 mg Subcutaneous Q24H  .  piperacillin-tazobactam (ZOSYN)  IV  3.375 g Intravenous Q8H  . potassium chloride  40 mEq Oral Once  . sodium chloride  3 mL Intravenous Q12H       LOS: 1 day    Currie Paris, MD, [facility], [place] [phone]   04/19/2013 8:25 AM

## 2013-04-19 NOTE — Progress Notes (Signed)
Utilization Review Completed.Kevin Webb T12/07/2012

## 2013-04-19 NOTE — Progress Notes (Signed)
SUBJECTIVE:  No chest pain.  Fever last night.  RUQ pain still   PHYSICAL EXAM Filed Vitals:   04/19/13 0322 04/19/13 0335 04/19/13 0358 04/19/13 0533  BP: 110/47   106/56  Pulse: 107 87 102 95  Temp: 99.4 F (37.4 C) 99.4 F (37.4 C) 100.5 F (38.1 C) 97.6 F (36.4 C)  TempSrc: Oral  Oral Oral  Resp: 40 28 24 20   Height:      Weight:      SpO2: 98%   96%   General:  No acute distress Lungs:  Clear Heart:  RRR Abdomen:  Positive bowel sounds, no rebound no guarding Extremities:  No edema  LABS: Lab Results  Component Value Date   TROPONINI <0.30 04/18/2013   Results for orders placed during the hospital encounter of 04/18/13 (from the past 24 hour(s))  TROPONIN I     Status: None   Collection Time    04/18/13 11:50 AM      Result Value Range   Troponin I <0.30  <0.30 ng/mL  HEMOGLOBIN A1C     Status: Abnormal   Collection Time    04/18/13 11:51 AM      Result Value Range   Hemoglobin A1C 6.0 (*) <5.7 %   Mean Plasma Glucose 126 (*) <117 mg/dL  TROPONIN I     Status: None   Collection Time    04/18/13  3:38 PM      Result Value Range   Troponin I <0.30  <0.30 ng/mL  CBC     Status: Abnormal   Collection Time    04/19/13  5:00 AM      Result Value Range   WBC 5.7  4.0 - 10.5 K/uL   RBC 4.30  4.22 - 5.81 MIL/uL   Hemoglobin 13.6  13.0 - 17.0 g/dL   HCT 62.9  52.8 - 41.3 %   MCV 91.6  78.0 - 100.0 fL   MCH 31.6  26.0 - 34.0 pg   MCHC 34.5  30.0 - 36.0 g/dL   RDW 24.4  01.0 - 27.2 %   Platelets 115 (*) 150 - 400 K/uL  COMPREHENSIVE METABOLIC PANEL     Status: Abnormal   Collection Time    04/19/13  5:00 AM      Result Value Range   Sodium 136  135 - 145 mEq/L   Potassium 3.3 (*) 3.5 - 5.1 mEq/L   Chloride 101  96 - 112 mEq/L   CO2 23  19 - 32 mEq/L   Glucose, Bld 126 (*) 70 - 99 mg/dL   BUN 12  6 - 23 mg/dL   Creatinine, Ser 5.36  0.50 - 1.35 mg/dL   Calcium 8.4  8.4 - 64.4 mg/dL   Total Protein 5.8 (*) 6.0 - 8.3 g/dL   Albumin 3.1 (*) 3.5 - 5.2  g/dL   AST 31  0 - 37 U/L   ALT 18  0 - 53 U/L   Alkaline Phosphatase 57  39 - 117 U/L   Total Bilirubin 1.3 (*) 0.3 - 1.2 mg/dL   GFR calc non Af Amer 84 (*) >90 mL/min   GFR calc Af Amer >90  >90 mL/min    Intake/Output Summary (Last 24 hours) at 04/19/13 0835 Last data filed at 04/19/13 0700  Gross per 24 hour  Intake    900 ml  Output    100 ml  Net    800 ml    ASSESSMENT AND PLAN:  CAD:  No acute cardiac issues.  Plavix being held in anticipation of cholecystectomy.   He has had no active chest pain symptoms since his last cath.  No contraindication to the planned procedure.  We will follow as needed.    Rollene Rotunda 04/19/2013 8:35 AM

## 2013-04-19 NOTE — Progress Notes (Signed)
[name]                                                                                Patient Demographics  Kevin Webb, is a 67 y.o. male, DOB - 1945-09-08, UJW:[id number]  Admit date - 04/18/2013   Admitting Physician Joseph Art, DO  Outpatient Primary MD for the patient is Marga Melnick, MD  LOS - 1   Chief Complaint  Patient presents with  . Chest Pain        Assessment & Plan   1. Chest pain which later became focused in the right upper quadrant secondary to acute cholecystitis. General surgery following the patient, on bowel rest with IV fluids and empiric IV antibiotics, scheduled for laparoscopic cholecystectomy on 04/21/2013.   2. History of CAD. Stable no acute issues, cardiology following, home medications continued with the exception of antiplatelet medications.    3. History of alcohol abuse. Counseled to quit alcohol abuse, placed on scheduled Librium, folic acid thiamine supplementation, CIWA protocol.    4. Hypertension continue Lopressor.    5. Dyslipidemia continue on statin.     Code Status: Full  Family Communication: None present  Disposition Plan: Home   Procedures CT scan abdomen pelvis   Consults  Gen. surgery, cardiology   Medications  Scheduled Meds: . atorvastatin  10 mg Oral q1800  . chlordiazePOXIDE  10 mg Oral TID  . enoxaparin (LOVENOX) injection  40 mg Subcutaneous Q24H  . metoprolol tartrate  12.5 mg Oral BID  . [START ON 04/20/2013] pantoprazole  40 mg Oral Daily  . piperacillin-tazobactam (ZOSYN)  IV  3.375 g Intravenous Q8H  . ramipril  10 mg Oral Daily  . sodium chloride  3 mL Intravenous Q12H  . valACYclovir  500 mg Oral Daily   Continuous Infusions: . 0.9 % NaCl with KCl 20 mEq / L     PRN Meds:.acetaminophen, acetaminophen, influenza vac split quadrivalent PF, LORazepam, LORazepam, morphine injection, nitroGLYCERIN, ondansetron, pneumococcal 23 valent vaccine  DVT Prophylaxis  Lovenox     Lab Results  Component Value Date   PLT 115* 04/19/2013    Antibiotics    Anti-infectives   Start     Dose/Rate Route Frequency Ordered Stop   04/19/13 1300  valACYclovir (VALTREX) tablet 500 mg     500 mg Oral Daily 04/19/13 1203     04/18/13 2300  piperacillin-tazobactam (ZOSYN) IVPB 3.375 g     3.375 g 12.5 mL/hr over 240 Minutes Intravenous 3 times per day 04/18/13 1715     04/18/13 1730  piperacillin-tazobactam (ZOSYN) IVPB 3.375 g     3.375 g 100 mL/hr over 30 Minutes Intravenous  Once 04/18/13 1715 04/18/13 1802          Subjective:   Kevin Webb today has, No headache, No chest pain, improved right upper quadrant abdominal pain - No Nausea, No new weakness tingling or numbness, No Cough - SOB.   Objective:   Filed Vitals:   04/19/13 0322 04/19/13 0335 04/19/13 0358 04/19/13 0533  BP: 110/47   106/56  Pulse: 107 87 102 95  Temp: 99.4 F (37.4 C) 99.4 F (37.4 C) 100.5 F (38.1 C) 97.6  F (36.4 C)  TempSrc: Oral  Oral Oral  Resp: 40 28 24 20   Height:      Weight:      SpO2: 98%   96%    Wt Readings from Last 3 Encounters:  04/18/13 89.449 kg (197 lb 3.2 oz)  01/17/13 95.709 kg (211 lb)  10/31/12 94.802 kg (209 lb)     Intake/Output Summary (Last 24 hours) at 04/19/13 1223 Last data filed at 04/19/13 0700  Gross per 24 hour  Intake    900 ml  Output    100 ml  Net    800 ml    Exam Awake Alert, Oriented X 3, No new F.N deficits, Normal affect [place].AT,PERRAL Supple Neck,No JVD, No cervical lymphadenopathy appriciated.  Symmetrical Chest wall movement, Good air movement bilaterally, CTAB RRR,No Gallops,Rubs or new Murmurs, No Parasternal Heave +ve B.Sounds, Abd Soft, mild right upper quadrant tenderness, No organomegaly appriciated, No rebound - guarding or rigidity. No Cyanosis, Clubbing or edema, No new Rash or bruise     Data Review   Micro Results No results found for this or any previous visit (from the past 240  hour(s)).  Radiology Reports Dg Chest 2 View  04/18/2013   CLINICAL DATA:  Left chest pain, left flank pain.  EXAM: CHEST  2 VIEW  COMPARISON:  08/30/2010  FINDINGS: There is no focal parenchymal opacity, pleural effusion, or pneumothorax. The heart and mediastinal contours are unremarkable. There is evidence of prior CABG.  The osseous structures are unremarkable.  IMPRESSION: No active cardiopulmonary disease.   Electronically Signed   By: Elige Ko   On: 04/18/2013 07:43   Nm Hepatobiliary  04/18/2013   CLINICAL DATA:  Question of a calculus cholecystitis. Right upper quadrant and right back pain. Nausea, vomiting for 17 hr.  EXAM: NUCLEAR MEDICINE HEPATOBILIARY IMAGING  TECHNIQUE: Sequential images of the abdomen were obtained out to 60 minutes following intravenous administration of radiopharmaceutical.  COMPARISON:  Ultrasound and CT on 04/18/2013  RADIOPHARMACEUTICALS:  5.54mCi Tc-87m Choletec  FINDINGS: There is early, appropriate hepatic activity. Bowel activity is seen as early as 10 min. Despite 2 hr of imaging, no gallbladder filling is noted, consistent with acute cholecystitis. Incidental note is made of gastroesophageal reflux.  IMPRESSION: 1. No opacification gallbladder, consistent with acute cholecystitis. 2. Note is made of gastroesophageal reflux.   Electronically Signed   By: Rosalie Gums M.D.   On: 04/18/2013 20:53   US Abdomen Complete  04/18/2013   CLINICAL DATA:  Epigastric and right upper abdominal pain and back pain.  EXAM: ULTRASOUND ABDOMEN COMPLETE  COMPARISON:  None.  FINDINGS: Gallbladder:  No gallstones or wall thickening visualized. No sonographic Murphy sign noted.  Common bile duct:  Diameter: 1.3 mm.  Liver:  No focal lesion identified. Within normal limits in parenchymal echogenicity.  IVC:  No abnormality visualized.  Pancreas:  The pancreas was obscured by bowel gas.  Spleen:  Size and appearance within normal limits.  Right Kidney:  Length: 13.1 cm. Echogenicity  within normal limits. No mass or hydronephrosis visualized.  Left Kidney:  Length: 12.4 cm. Echogenicity within normal limits. There are parapelvic cysts. There is no evidence of hydronephrosis.  Abdominal aorta:  Bowel gas obscures the midportion of the abdominal aorta. The maximal diameter of the abdominal aorta is seen. Proximally it measures 2.9 cm.  Other findings:  No ascites is demonstrated.  IMPRESSION: 1. The gallbladder is normal in appearance with no evidence of stones nor findings to  suggest acute cholecystitis. The common bile duct is normal in caliber. 2. The liver, spleen, and kidneys exhibit no acute abnormalities. The pancreas is obscured by bowel gas. 3. The visualized portions of the abdominal aorta appear normal.   Electronically Signed   By: David  [place]   On: 04/18/2013 09:49   Ct Angio Chest Aortic Dissect W &/or W/o  04/18/2013   CLINICAL DATA:  67 year old with abdominal pain.  EXAM: CT ANGIOGRAPHY CHEST, ABDOMEN AND PELVIS  TECHNIQUE: Multidetector CT imaging through the chest, abdomen and pelvis was performed using the standard protocol during bolus administration of intravenous contrast. Multiplanar reconstructed images including MIPs were obtained and reviewed to evaluate the vascular anatomy.  CONTRAST:  OMNIPAQUE IOHEXOL 350 MG/ML SOLN  COMPARISON:  Ultrasound 04/18/2013  FINDINGS: CTA CHEST FINDINGS  Negative for an aortic dissection or intramural hematoma. Patient had a median sternotomy and CABG procedure. Ascending thoracic aorta is prominent for size measuring up to 4 cm. The great vessels are patent. Descending thoracic aorta measures up to 3.4 cm. The pulmonary artery is slightly ectatic but no evidence for large filling defects in the main pulmonary arteries. No evidence for mediastinal, hilar or axillary lymphadenopathy. No significant pericardial or pleural fluid. Mild dilatation of the distal esophagus. The trachea and mainstem bronchi are patent. Both lungs are  clear without airspace disease or consolidation. No acute bone abnormality.  Review of the MIP images confirms the above findings.  CTA ABDOMEN AND PELVIS FINDINGS  The abdominal aorta is patent without dissection or aneurysm. There is flow in the celiac trunk, SMA and inferior mesenteric artery. Atherosclerotic calcifications in the abdominal aorta. The patient has bilateral accessory renal arteries. There is plaque and at least mild stenosis at the origin of the main left renal artery. Incidentally, the left gastric artery originates from the aorta and just above the celiac trunk. Dilatation of the proximal right common iliac artery measuring 1.7 cm. There is a small amount of irregular plaque in the right common iliac artery and this could represent a 0.5 cm penetrating ulcer. Right external iliac arteries are patent. Proximal right femoral arteries are patent. Dilatation of the left common iliac artery measuring 1.6 cm. There is occlusion of the proximal left internal iliac iliac artery just beyond the origin with distal reconstitution of the left internal iliac branches. Left external iliac artery is widely patent. Proximal left femoral arteries are patent.  The gallbladder is distended and there is a small amount of fluid along the inferior aspect of the gallbladder. No definite gallstones. Limited evaluation of the portal venous system on this arterial study. There is concern for a trace amount of fluid around the spleen. No gross abnormality to the stomach or duodenum. No gross abnormality to the pancreas. No clear evidence for biliary or pancreatic duct dilatation. Normal appearance of both adrenal glands. Exophytic cystic structure along the medial aspect of the right kidney measures 1.9 cm. Few small densities within this cystic structure could represent small septations. Multiple low-density structures associated with the left kidney probably represent parapelvic and cortical cysts. Incidentally, the  patient has a retroaortic left renal vein. No significant lymphadenopathy. Normal appearance of the prostate and urinary bladder. Small left inguinal hernia containing fat. Diverticulosis in the sigmoid colon and left colon. Normal appearance of the terminal ileum. The appendix may have been surgically removed. No gross abnormality to the small or large bowel. No acute bone abnormalities.  Review of the MIP images confirms the above findings.  IMPRESSION: The gallbladder is distended with mild stranding around the gallbladder. Findings are concerning for acute acalculous cholecystitis based on the prior ultrasound findings.  Negative for aortic dissection. Mild dilatation of the ascending thoracic aorta.  Mild narrowing of the main left renal artery. Patient has bilateral accessory renal arteries.  Focal irregular plaque in the right common iliac artery. Findings could represent a small penetrating ulcer. This area roughly measures 0.5 cm.  Proximal occlusion of the left internal iliac artery.  Bilateral renal cysts. Exophytic cyst in the right kidney may contain septations. This area was not specifically evaluated on the recent ultrasound and consider a follow-up renal ultrasound to follow this cystic structure.   Electronically Signed   By: Richarda Overlie M.D.   On: 04/18/2013 13:22    CBC  Recent Labs Lab 04/18/13 0713 04/19/13 0500  WBC 10.5 5.7  HGB 14.3 13.6  HCT 41.4 39.4  PLT 173 115*  MCV 92.2 91.6  MCH 31.8 31.6  MCHC 34.5 34.5  RDW 14.1 14.4    Chemistries   Recent Labs Lab 04/18/13 0713 04/19/13 0500  NA 137 136  K 3.9 3.3*  CL 101 101  CO2 25 23  GLUCOSE 157* 126*  BUN 7 12  CREATININE 0.89 0.98  CALCIUM 9.1 8.4  AST 32 31  ALT 22 18  ALKPHOS 47 57  BILITOT 0.4 1.3*   ------------------------------------------------------------------------------------------------------------------ estimated creatinine clearance is 81.8 ml/min (by C-G formula based on Cr of  0.98). ------------------------------------------------------------------------------------------------------------------  Recent Labs  04/18/13 1151  HGBA1C 6.0*   ------------------------------------------------------------------------------------------------------------------ No results found for this basename: CHOL, HDL, LDLCALC, TRIG, CHOLHDL, LDLDIRECT,  in the last 72 hours ------------------------------------------------------------------------------------------------------------------ No results found for this basename: TSH, T4TOTAL, FREET3, T3FREE, THYROIDAB,  in the last 72 hours ------------------------------------------------------------------------------------------------------------------ No results found for this basename: VITAMINB12, FOLATE, FERRITIN, TIBC, IRON, RETICCTPCT,  in the last 72 hours  Coagulation profile  Recent Labs Lab 04/18/13 0713  INR 1.00    No results found for this basename: DDIMER,  in the last 72 hours  Cardiac Enzymes  Recent Labs Lab 04/18/13 0713 04/18/13 1150 04/18/13 1538  TROPONINI <0.30 <0.30 <0.30   ------------------------------------------------------------------------------------------------------------------ No components found with this basename: POCBNP,      Time Spent in minutes  35   SINGH,PRASHANT K M.D on 04/19/2013 at 12:23 PM  Between 7am to 7pm - Pager - [phone]  After 7pm go to [email or web address] - password [email or web address]  And look for the night coverage person covering for me after hours  [name] Office  [phone]

## 2013-04-20 ENCOUNTER — Inpatient Hospital Stay (HOSPITAL_COMMUNITY): Payer: Medicare Other

## 2013-04-20 DIAGNOSIS — R109 Unspecified abdominal pain: Secondary | ICD-10-CM | POA: Diagnosis not present

## 2013-04-20 DIAGNOSIS — R1011 Right upper quadrant pain: Secondary | ICD-10-CM | POA: Diagnosis not present

## 2013-04-20 DIAGNOSIS — J9819 Other pulmonary collapse: Secondary | ICD-10-CM | POA: Diagnosis not present

## 2013-04-20 DIAGNOSIS — R112 Nausea with vomiting, unspecified: Secondary | ICD-10-CM | POA: Diagnosis not present

## 2013-04-20 DIAGNOSIS — K810 Acute cholecystitis: Secondary | ICD-10-CM | POA: Diagnosis not present

## 2013-04-20 LAB — CBC
Hemoglobin: 12.7 g/dL — ABNORMAL LOW (ref 13.0–17.0)
MCH: 31.1 pg (ref 26.0–34.0)
MCHC: 33.4 g/dL (ref 30.0–36.0)
MCV: 92.9 fL (ref 78.0–100.0)
Platelets: 114 10*3/uL — ABNORMAL LOW (ref 150–400)
RBC: 4.09 MIL/uL — ABNORMAL LOW (ref 4.22–5.81)

## 2013-04-20 LAB — COMPREHENSIVE METABOLIC PANEL
ALT: 23 U/L (ref 0–53)
AST: 43 U/L — ABNORMAL HIGH (ref 0–37)
Alkaline Phosphatase: 67 U/L (ref 39–117)
CO2: 21 mEq/L (ref 19–32)
Chloride: 100 mEq/L (ref 96–112)
Creatinine, Ser: 1 mg/dL (ref 0.50–1.35)
GFR calc Af Amer: 89 mL/min — ABNORMAL LOW (ref >90)
GFR calc non Af Amer: 77 mL/min — ABNORMAL LOW (ref >90)
Sodium: 134 mEq/L — ABNORMAL LOW (ref 135–145)
Total Bilirubin: 1 mg/dL (ref 0.3–1.2)

## 2013-04-20 MED ORDER — ENOXAPARIN SODIUM 40 MG/0.4ML ~~LOC~~ SOLN
40.0000 mg | SUBCUTANEOUS | Status: DC
  Filled 2013-04-20: qty 0.4

## 2013-04-20 MED ORDER — POTASSIUM CHLORIDE IN NACL 40-0.9 MEQ/L-% IV SOLN
INTRAVENOUS | Status: DC
  Administered 2013-04-20 – 2013-04-21 (×2): via INTRAVENOUS
  Filled 2013-04-20 (×4): qty 1000

## 2013-04-20 MED ORDER — CHLORHEXIDINE GLUCONATE 4 % EX LIQD
1.0000 "application " | Freq: Once | CUTANEOUS | Status: AC
  Administered 2013-04-20: 1 via TOPICAL
  Filled 2013-04-20: qty 15

## 2013-04-20 MED ORDER — CHLORHEXIDINE GLUCONATE 4 % EX LIQD
1.0000 "application " | Freq: Once | CUTANEOUS | Status: AC
  Administered 2013-04-21: 1 via TOPICAL
  Filled 2013-04-20: qty 15

## 2013-04-20 NOTE — Progress Notes (Signed)
Agree with A&P of KO,PA. Patient says he feels better but still tender RUQ and wbc up today. Will plan lap chle for tomorrow.  I have discussed the indications for laparoscopic cholecystectomy with him and provided educational material. We have discussed the risks of surgery, including general risks such as bleeding, infection, lung and heart issues etc. We have also discussed the potential for injuries to other organs, bile duct leaks, and other unexpected events. We have also talked about the fact that this may need to be converted to open under certain circumstances. We discussed the typical post op recovery and the fact that there is a good likelihood of improvement in symptoms and return to normal activity.  He understands this and wishes to proceed to schedule surgery. I believe all of his questions have been answered.

## 2013-04-20 NOTE — Care Management Note (Unsigned)
    Page 1 of 1   04/20/2013     4:51:40 PM   CARE MANAGEMENT NOTE 04/20/2013  Patient:  Kevin Webb, Kevin Webb   Account Number:  [id number]  Date Initiated:  04/20/2013  Documentation initiated by:  Avalon Coppinger  Subjective/Objective Assessment:   PT ADM ON 04/18/13 WITH CHOLECYSTITIS.  PTA, PT INDEPENDENT OF ADLS.  CURRENT ETOH USE.     Action/Plan:   WILL FOLLOW FOR DC PLANS AS PT PROGRESSES.  PLAN SURGERY ON 04/21/13.   Anticipated DC Date:  04/22/2013   Anticipated DC Plan:  HOME/SELF CARE      DC Planning Services  CM consult      Choice offered to / List presented to:             Status of service:  In process, will continue to follow Medicare Important Message given?   (If response is "NO", the following Medicare IM given date fields will be blank) Date Medicare IM given:   Date Additional Medicare IM given:    Discharge Disposition:    Per UR Regulation:  Reviewed for med. necessity/level of care/duration of stay  If discussed at Long Length of Stay Meetings, dates discussed:    Comments:

## 2013-04-20 NOTE — Progress Notes (Signed)
Per MD, patient wanted to speak to someone about POA paperwork. CSW went into room and introduced self to patient. CSW provided POA paperwork for patient to look over before [employer] come by. CSW notified [employer]. CSW signing off at this time.  Maree Krabbe, MSW, Theresia Majors [phone]

## 2013-04-20 NOTE — Progress Notes (Signed)
[name]                                                                                Patient Demographics  Kevin Webb, is a 67 y.o. male, DOB - 04-25-1946, ZOX:[id number]  Admit date - 04/18/2013   Admitting Physician Joseph Art, DO  Outpatient Primary MD for the patient is Marga Melnick, MD  LOS - 2   Chief Complaint  Patient presents with  . Chest Pain        Assessment & Plan   1. Chest pain which later became focused in the right upper quadrant secondary to acute cholecystitis. General surgery following the patient, on bowel rest with IV fluids and empiric IV antibiotics, scheduled for laparoscopic cholecystectomy on 04/21/2013. D/W CCS today.    2. History of CAD. Stable no acute issues, cardiology following, home medications continued with the exception of antiplatelet medications.    3. History of alcohol abuse. Counseled to quit alcohol abuse, placed on scheduled Librium, folic acid thiamine supplementation, CIWA protocol.    4. Hypertension continue Lopressor.    5. Dyslipidemia continue on statin.     Code Status: Full  Family Communication: None present  Disposition Plan: Home   Procedures CT scan abdomen pelvis   Consults  Gen. surgery, cardiology   Medications  Scheduled Meds: . atorvastatin  10 mg Oral q1800  . chlordiazePOXIDE  10 mg Oral TID  . [START ON 04/22/2013] enoxaparin (LOVENOX) injection  40 mg Subcutaneous Q24H  . influenza vac split quadrivalent PF  0.5 mL Intramuscular Tomorrow-1000  . metoprolol tartrate  12.5 mg Oral BID  . pantoprazole  40 mg Oral Daily  . piperacillin-tazobactam (ZOSYN)  IV  3.375 g Intravenous Q8H  . ramipril  10 mg Oral Daily  . sodium chloride  3 mL Intravenous Q12H  . valACYclovir  500 mg Oral Daily   Continuous Infusions: . 0.9 % NaCl with KCl 40 mEq / L     PRN Meds:.acetaminophen, acetaminophen, LORazepam, LORazepam, morphine injection, nitroGLYCERIN, ondansetron,  pneumococcal 23 valent vaccine  DVT Prophylaxis  Lovenox    Lab Results  Component Value Date   PLT 114* 04/20/2013    Antibiotics    Anti-infectives   Start     Dose/Rate Route Frequency Ordered Stop   04/19/13 1300  valACYclovir (VALTREX) tablet 500 mg     500 mg Oral Daily 04/19/13 1203     04/18/13 2300  piperacillin-tazobactam (ZOSYN) IVPB 3.375 g     3.375 g 12.5 mL/hr over 240 Minutes Intravenous 3 times per day 04/18/13 1715     04/18/13 1730  piperacillin-tazobactam (ZOSYN) IVPB 3.375 g     3.375 g 100 mL/hr over 30 Minutes Intravenous  Once 04/18/13 1715 04/18/13 1802          Subjective:   Kevin Webb today has, No headache, No chest pain, improved right upper quadrant abdominal pain - No Nausea, No new weakness tingling or numbness, No Cough - SOB.   Objective:   Filed Vitals:   04/19/13 1408 04/19/13 2019 04/20/13 0000 04/20/13 0451  BP: 98/61 93/44  113/65  Pulse: 99 74 72 81  Temp:  98.3  F (36.8 C)  99.1 F (37.3 C)  TempSrc:  Oral  Oral  Resp:  18  18  Height:      Weight:      SpO2:  97%  95%    Wt Readings from Last 3 Encounters:  04/18/13 89.449 kg (197 lb 3.2 oz)  01/17/13 95.709 kg (211 lb)  10/31/12 94.802 kg (209 lb)     Intake/Output Summary (Last 24 hours) at 04/20/13 1115 Last data filed at 04/20/13 0452  Gross per 24 hour  Intake      0 ml  Output   1025 ml  Net  -1025 ml    Exam Awake Alert, Oriented X 3, No new F.N deficits, Normal affect [place].AT,PERRAL Supple Neck,No JVD, No cervical lymphadenopathy appriciated.  Symmetrical Chest wall movement, Good air movement bilaterally, CTAB RRR,No Gallops,Rubs or new Murmurs, No Parasternal Heave +ve B.Sounds, Abd Soft, mild right upper quadrant tenderness, No organomegaly appriciated, No rebound - guarding or rigidity. No Cyanosis, Clubbing or edema, No new Rash or bruise     Data Review   Micro Results No results found for this or any previous visit (from the past 240  hour(s)).  Radiology Reports Dg Chest 2 View  04/18/2013   CLINICAL DATA:  Left chest pain, left flank pain.  EXAM: CHEST  2 VIEW  COMPARISON:  08/30/2010  FINDINGS: There is no focal parenchymal opacity, pleural effusion, or pneumothorax. The heart and mediastinal contours are unremarkable. There is evidence of prior CABG.  The osseous structures are unremarkable.  IMPRESSION: No active cardiopulmonary disease.   Electronically Signed   By: Elige Ko   On: 04/18/2013 07:43   Nm Hepatobiliary  04/18/2013   CLINICAL DATA:  Question of a calculus cholecystitis. Right upper quadrant and right back pain. Nausea, vomiting for 17 hr.  EXAM: NUCLEAR MEDICINE HEPATOBILIARY IMAGING  TECHNIQUE: Sequential images of the abdomen were obtained out to 60 minutes following intravenous administration of radiopharmaceutical.  COMPARISON:  Ultrasound and CT on 04/18/2013  RADIOPHARMACEUTICALS:  5.39mCi Tc-59m Choletec  FINDINGS: There is early, appropriate hepatic activity. Bowel activity is seen as early as 10 min. Despite 2 hr of imaging, no gallbladder filling is noted, consistent with acute cholecystitis. Incidental note is made of gastroesophageal reflux.  IMPRESSION: 1. No opacification gallbladder, consistent with acute cholecystitis. 2. Note is made of gastroesophageal reflux.   Electronically Signed   By: Rosalie Gums M.D.   On: 04/18/2013 20:53   US Abdomen Complete  04/18/2013   CLINICAL DATA:  Epigastric and right upper abdominal pain and back pain.  EXAM: ULTRASOUND ABDOMEN COMPLETE  COMPARISON:  None.  FINDINGS: Gallbladder:  No gallstones or wall thickening visualized. No sonographic Murphy sign noted.  Common bile duct:  Diameter: 1.3 mm.  Liver:  No focal lesion identified. Within normal limits in parenchymal echogenicity.  IVC:  No abnormality visualized.  Pancreas:  The pancreas was obscured by bowel gas.  Spleen:  Size and appearance within normal limits.  Right Kidney:  Length: 13.1 cm. Echogenicity  within normal limits. No mass or hydronephrosis visualized.  Left Kidney:  Length: 12.4 cm. Echogenicity within normal limits. There are parapelvic cysts. There is no evidence of hydronephrosis.  Abdominal aorta:  Bowel gas obscures the midportion of the abdominal aorta. The maximal diameter of the abdominal aorta is seen. Proximally it measures 2.9 cm.  Other findings:  No ascites is demonstrated.  IMPRESSION: 1. The gallbladder is normal in appearance with no evidence of  stones nor findings to suggest acute cholecystitis. The common bile duct is normal in caliber. 2. The liver, spleen, and kidneys exhibit no acute abnormalities. The pancreas is obscured by bowel gas. 3. The visualized portions of the abdominal aorta appear normal.   Electronically Signed   By: David  [place]   On: 04/18/2013 09:49   Ct Angio Chest Aortic Dissect W &/or W/o  04/18/2013   CLINICAL DATA:  67 year old with abdominal pain.  EXAM: CT ANGIOGRAPHY CHEST, ABDOMEN AND PELVIS  TECHNIQUE: Multidetector CT imaging through the chest, abdomen and pelvis was performed using the standard protocol during bolus administration of intravenous contrast. Multiplanar reconstructed images including MIPs were obtained and reviewed to evaluate the vascular anatomy.  CONTRAST:  OMNIPAQUE IOHEXOL 350 MG/ML SOLN  COMPARISON:  Ultrasound 04/18/2013  FINDINGS: CTA CHEST FINDINGS  Negative for an aortic dissection or intramural hematoma. Patient had a median sternotomy and CABG procedure. Ascending thoracic aorta is prominent for size measuring up to 4 cm. The great vessels are patent. Descending thoracic aorta measures up to 3.4 cm. The pulmonary artery is slightly ectatic but no evidence for large filling defects in the main pulmonary arteries. No evidence for mediastinal, hilar or axillary lymphadenopathy. No significant pericardial or pleural fluid. Mild dilatation of the distal esophagus. The trachea and mainstem bronchi are patent. Both lungs are  clear without airspace disease or consolidation. No acute bone abnormality.  Review of the MIP images confirms the above findings.  CTA ABDOMEN AND PELVIS FINDINGS  The abdominal aorta is patent without dissection or aneurysm. There is flow in the celiac trunk, SMA and inferior mesenteric artery. Atherosclerotic calcifications in the abdominal aorta. The patient has bilateral accessory renal arteries. There is plaque and at least mild stenosis at the origin of the main left renal artery. Incidentally, the left gastric artery originates from the aorta and just above the celiac trunk. Dilatation of the proximal right common iliac artery measuring 1.7 cm. There is a small amount of irregular plaque in the right common iliac artery and this could represent a 0.5 cm penetrating ulcer. Right external iliac arteries are patent. Proximal right femoral arteries are patent. Dilatation of the left common iliac artery measuring 1.6 cm. There is occlusion of the proximal left internal iliac iliac artery just beyond the origin with distal reconstitution of the left internal iliac branches. Left external iliac artery is widely patent. Proximal left femoral arteries are patent.  The gallbladder is distended and there is a small amount of fluid along the inferior aspect of the gallbladder. No definite gallstones. Limited evaluation of the portal venous system on this arterial study. There is concern for a trace amount of fluid around the spleen. No gross abnormality to the stomach or duodenum. No gross abnormality to the pancreas. No clear evidence for biliary or pancreatic duct dilatation. Normal appearance of both adrenal glands. Exophytic cystic structure along the medial aspect of the right kidney measures 1.9 cm. Few small densities within this cystic structure could represent small septations. Multiple low-density structures associated with the left kidney probably represent parapelvic and cortical cysts. Incidentally, the  patient has a retroaortic left renal vein. No significant lymphadenopathy. Normal appearance of the prostate and urinary bladder. Small left inguinal hernia containing fat. Diverticulosis in the sigmoid colon and left colon. Normal appearance of the terminal ileum. The appendix may have been surgically removed. No gross abnormality to the small or large bowel. No acute bone abnormalities.  Review of the MIP images  confirms the above findings.  IMPRESSION: The gallbladder is distended with mild stranding around the gallbladder. Findings are concerning for acute acalculous cholecystitis based on the prior ultrasound findings.  Negative for aortic dissection. Mild dilatation of the ascending thoracic aorta.  Mild narrowing of the main left renal artery. Patient has bilateral accessory renal arteries.  Focal irregular plaque in the right common iliac artery. Findings could represent a small penetrating ulcer. This area roughly measures 0.5 cm.  Proximal occlusion of the left internal iliac artery.  Bilateral renal cysts. Exophytic cyst in the right kidney may contain septations. This area was not specifically evaluated on the recent ultrasound and consider a follow-up renal ultrasound to follow this cystic structure.   Electronically Signed   By: Richarda Overlie M.D.   On: 04/18/2013 13:22    CBC  Recent Labs Lab 04/18/13 0713 04/19/13 0500 04/20/13 0442  WBC 10.5 5.7 16.5*  HGB 14.3 13.6 12.7*  HCT 41.4 39.4 38.0*  PLT 173 115* 114*  MCV 92.2 91.6 92.9  MCH 31.8 31.6 31.1  MCHC 34.5 34.5 33.4  RDW 14.1 14.4 14.5    Chemistries   Recent Labs Lab 04/18/13 0713 04/19/13 0500 04/20/13 0442  NA 137 136 134*  K 3.9 3.3* 3.4*  CL 101 101 100  CO2 25 23 21   GLUCOSE 157* 126* 94  BUN 7 12 15   CREATININE 0.89 0.98 1.00  CALCIUM 9.1 8.4 8.1*  AST 32 31 43*  ALT 22 18 23   ALKPHOS 47 57 67  BILITOT 0.4 1.3* 1.0    ------------------------------------------------------------------------------------------------------------------ estimated creatinine clearance is 80.2 ml/min (by C-G formula based on Cr of 1). ------------------------------------------------------------------------------------------------------------------  Recent Labs  04/18/13 1151  HGBA1C 6.0*   ------------------------------------------------------------------------------------------------------------------ No results found for this basename: CHOL, HDL, LDLCALC, TRIG, CHOLHDL, LDLDIRECT,  in the last 72 hours ------------------------------------------------------------------------------------------------------------------ No results found for this basename: TSH, T4TOTAL, FREET3, T3FREE, THYROIDAB,  in the last 72 hours ------------------------------------------------------------------------------------------------------------------ No results found for this basename: VITAMINB12, FOLATE, FERRITIN, TIBC, IRON, RETICCTPCT,  in the last 72 hours  Coagulation profile  Recent Labs Lab 04/18/13 0713  INR 1.00    No results found for this basename: DDIMER,  in the last 72 hours  Cardiac Enzymes  Recent Labs Lab 04/18/13 0713 04/18/13 1150 04/18/13 1538  TROPONINI <0.30 <0.30 <0.30   ------------------------------------------------------------------------------------------------------------------ No components found with this basename: POCBNP,      Time Spent in minutes  35   Alan Drummer K M.D on 04/20/2013 at 11:15 AM  Between 7am to 7pm - Pager - [phone]  After 7pm go to [email or web address] - password [email or web address]  And look for the night coverage person covering for me after hours  [name] Office  [phone]

## 2013-04-20 NOTE — Progress Notes (Signed)
Patient ID: Kevin Webb, male   DOB: 05/02/1946, 67 y.o.   MRN: [id number]    Subjective: Patient feels better today.  Still has some abdominal pain, but improved.  No nausea or vomiting  Objective: Vital signs in last 24 hours: Temp:  [98.3 F (36.8 C)-99.1 F (37.3 C)] 99.1 F (37.3 C) (12/04 0451) Pulse Rate:  [72-99] 81 (12/04 0451) Resp:  [18] 18 (12/04 0451) BP: (93-113)/(44-65) 113/65 mmHg (12/04 0451) SpO2:  [95 %-97 %] 95 % (12/04 0451) Last BM Date: 04/17/13  Intake/Output from previous day: 12/03 0701 - 12/04 0700 In: 0  Out: 1025 [Urine:1025] Intake/Output this shift:    PE: Abd: soft, tender in the RUQ with some voluntary guarding, +BS,  Heart: regular Lungs: CTAB  Lab Results:   Recent Labs  04/19/13 0500 04/20/13 0442  WBC 5.7 16.5*  HGB 13.6 12.7*  HCT 39.4 38.0*  PLT 115* 114*   BMET  Recent Labs  04/19/13 0500 04/20/13 0442  NA 136 134*  K 3.3* 3.4*  CL 101 100  CO2 23 21  GLUCOSE 126* 94  BUN 12 15  CREATININE 0.98 1.00  CALCIUM 8.4 8.1*   PT/INR  Recent Labs  04/18/13 0713  LABPROT 13.0  INR 1.00   CMP     Component Value Date/Time   NA 134* 04/20/2013 0442   K 3.4* 04/20/2013 0442   CL 100 04/20/2013 0442   CO2 21 04/20/2013 0442   GLUCOSE 94 04/20/2013 0442   BUN 15 04/20/2013 0442   CREATININE 1.00 04/20/2013 0442   CALCIUM 8.1* 04/20/2013 0442   PROT 5.8* 04/20/2013 0442   ALBUMIN 2.7* 04/20/2013 0442   AST 43* 04/20/2013 0442   ALT 23 04/20/2013 0442   ALKPHOS 67 04/20/2013 0442   BILITOT 1.0 04/20/2013 0442   GFRNONAA 77* 04/20/2013 0442   GFRAA 89* 04/20/2013 0442   Lipase     Component Value Date/Time   LIPASE 18 04/18/2013 0713       Studies/Results: Nm Hepatobiliary  04/18/2013   CLINICAL DATA:  Question of a calculus cholecystitis. Right upper quadrant and right back pain. Nausea, vomiting for 17 hr.  EXAM: NUCLEAR MEDICINE HEPATOBILIARY IMAGING  TECHNIQUE: Sequential images of the abdomen were obtained out  to 60 minutes following intravenous administration of radiopharmaceutical.  COMPARISON:  Ultrasound and CT on 04/18/2013  RADIOPHARMACEUTICALS:  5.64mCi Tc-80m Choletec  FINDINGS: There is early, appropriate hepatic activity. Bowel activity is seen as early as 10 min. Despite 2 hr of imaging, no gallbladder filling is noted, consistent with acute cholecystitis. Incidental note is made of gastroesophageal reflux.  IMPRESSION: 1. No opacification gallbladder, consistent with acute cholecystitis. 2. Note is made of gastroesophageal reflux.   Electronically Signed   By: Rosalie Gums M.D.   On: 04/18/2013 20:53   US Abdomen Complete  04/18/2013   CLINICAL DATA:  Epigastric and right upper abdominal pain and back pain.  EXAM: ULTRASOUND ABDOMEN COMPLETE  COMPARISON:  None.  FINDINGS: Gallbladder:  No gallstones or wall thickening visualized. No sonographic Murphy sign noted.  Common bile duct:  Diameter: 1.3 mm.  Liver:  No focal lesion identified. Within normal limits in parenchymal echogenicity.  IVC:  No abnormality visualized.  Pancreas:  The pancreas was obscured by bowel gas.  Spleen:  Size and appearance within normal limits.  Right Kidney:  Length: 13.1 cm. Echogenicity within normal limits. No mass or hydronephrosis visualized.  Left Kidney:  Length: 12.4 cm. Echogenicity within normal limits.  There are parapelvic cysts. There is no evidence of hydronephrosis.  Abdominal aorta:  Bowel gas obscures the midportion of the abdominal aorta. The maximal diameter of the abdominal aorta is seen. Proximally it measures 2.9 cm.  Other findings:  No ascites is demonstrated.  IMPRESSION: 1. The gallbladder is normal in appearance with no evidence of stones nor findings to suggest acute cholecystitis. The common bile duct is normal in caliber. 2. The liver, spleen, and kidneys exhibit no acute abnormalities. The pancreas is obscured by bowel gas. 3. The visualized portions of the abdominal aorta appear normal.    Electronically Signed   By: David  [place]   On: 04/18/2013 09:49   Dg Chest Port 1 View  04/20/2013   CLINICAL DATA:  Cough  EXAM: PORTABLE CHEST - 1 VIEW  COMPARISON:  04/18/2013  FINDINGS: Progression of cardiac enlargement and vascular congestion. Negative for edema or effusion. Prior CABG. Mild atelectasis in the lung bases.  IMPRESSION: Cardiac enlargement with mild vascular congestion.  No edema  Low lung volumes with atelectasis in both bases.   Electronically Signed   By: Marlan Palau M.D.   On: 04/20/2013 08:21   Ct Angio Chest Aortic Dissect W &/or W/o  04/18/2013   CLINICAL DATA:  67 year old with abdominal pain.  EXAM: CT ANGIOGRAPHY CHEST, ABDOMEN AND PELVIS  TECHNIQUE: Multidetector CT imaging through the chest, abdomen and pelvis was performed using the standard protocol during bolus administration of intravenous contrast. Multiplanar reconstructed images including MIPs were obtained and reviewed to evaluate the vascular anatomy.  CONTRAST:  OMNIPAQUE IOHEXOL 350 MG/ML SOLN  COMPARISON:  Ultrasound 04/18/2013  FINDINGS: CTA CHEST FINDINGS  Negative for an aortic dissection or intramural hematoma. Patient had a median sternotomy and CABG procedure. Ascending thoracic aorta is prominent for size measuring up to 4 cm. The great vessels are patent. Descending thoracic aorta measures up to 3.4 cm. The pulmonary artery is slightly ectatic but no evidence for large filling defects in the main pulmonary arteries. No evidence for mediastinal, hilar or axillary lymphadenopathy. No significant pericardial or pleural fluid. Mild dilatation of the distal esophagus. The trachea and mainstem bronchi are patent. Both lungs are clear without airspace disease or consolidation. No acute bone abnormality.  Review of the MIP images confirms the above findings.  CTA ABDOMEN AND PELVIS FINDINGS  The abdominal aorta is patent without dissection or aneurysm. There is flow in the celiac trunk, SMA and inferior  mesenteric artery. Atherosclerotic calcifications in the abdominal aorta. The patient has bilateral accessory renal arteries. There is plaque and at least mild stenosis at the origin of the main left renal artery. Incidentally, the left gastric artery originates from the aorta and just above the celiac trunk. Dilatation of the proximal right common iliac artery measuring 1.7 cm. There is a small amount of irregular plaque in the right common iliac artery and this could represent a 0.5 cm penetrating ulcer. Right external iliac arteries are patent. Proximal right femoral arteries are patent. Dilatation of the left common iliac artery measuring 1.6 cm. There is occlusion of the proximal left internal iliac iliac artery just beyond the origin with distal reconstitution of the left internal iliac branches. Left external iliac artery is widely patent. Proximal left femoral arteries are patent.  The gallbladder is distended and there is a small amount of fluid along the inferior aspect of the gallbladder. No definite gallstones. Limited evaluation of the portal venous system on this arterial study. There is concern for  a trace amount of fluid around the spleen. No gross abnormality to the stomach or duodenum. No gross abnormality to the pancreas. No clear evidence for biliary or pancreatic duct dilatation. Normal appearance of both adrenal glands. Exophytic cystic structure along the medial aspect of the right kidney measures 1.9 cm. Few small densities within this cystic structure could represent small septations. Multiple low-density structures associated with the left kidney probably represent parapelvic and cortical cysts. Incidentally, the patient has a retroaortic left renal vein. No significant lymphadenopathy. Normal appearance of the prostate and urinary bladder. Small left inguinal hernia containing fat. Diverticulosis in the sigmoid colon and left colon. Normal appearance of the terminal ileum. The appendix may  have been surgically removed. No gross abnormality to the small or large bowel. No acute bone abnormalities.  Review of the MIP images confirms the above findings.  IMPRESSION: The gallbladder is distended with mild stranding around the gallbladder. Findings are concerning for acute acalculous cholecystitis based on the prior ultrasound findings.  Negative for aortic dissection. Mild dilatation of the ascending thoracic aorta.  Mild narrowing of the main left renal artery. Patient has bilateral accessory renal arteries.  Focal irregular plaque in the right common iliac artery. Findings could represent a small penetrating ulcer. This area roughly measures 0.5 cm.  Proximal occlusion of the left internal iliac artery.  Bilateral renal cysts. Exophytic cyst in the right kidney may contain septations. This area was not specifically evaluated on the recent ultrasound and consider a follow-up renal ultrasound to follow this cystic structure.   Electronically Signed   By: Richarda Overlie M.D.   On: 04/18/2013 13:22   Ct Angio Abd/pel W/ And/or W/o  04/18/2013   CLINICAL DATA:  67 year old with abdominal pain.  EXAM: CT ANGIOGRAPHY CHEST, ABDOMEN AND PELVIS  TECHNIQUE: Multidetector CT imaging through the chest, abdomen and pelvis was performed using the standard protocol during bolus administration of intravenous contrast. Multiplanar reconstructed images including MIPs were obtained and reviewed to evaluate the vascular anatomy.  CONTRAST:  OMNIPAQUE IOHEXOL 350 MG/ML SOLN  COMPARISON:  Ultrasound 04/18/2013  FINDINGS: CTA CHEST FINDINGS  Negative for an aortic dissection or intramural hematoma. Patient had a median sternotomy and CABG procedure. Ascending thoracic aorta is prominent for size measuring up to 4 cm. The great vessels are patent. Descending thoracic aorta measures up to 3.4 cm. The pulmonary artery is slightly ectatic but no evidence for large filling defects in the main pulmonary arteries. No evidence  for mediastinal, hilar or axillary lymphadenopathy. No significant pericardial or pleural fluid. Mild dilatation of the distal esophagus. The trachea and mainstem bronchi are patent. Both lungs are clear without airspace disease or consolidation. No acute bone abnormality.  Review of the MIP images confirms the above findings.  CTA ABDOMEN AND PELVIS FINDINGS  The abdominal aorta is patent without dissection or aneurysm. There is flow in the celiac trunk, SMA and inferior mesenteric artery. Atherosclerotic calcifications in the abdominal aorta. The patient has bilateral accessory renal arteries. There is plaque and at least mild stenosis at the origin of the main left renal artery. Incidentally, the left gastric artery originates from the aorta and just above the celiac trunk. Dilatation of the proximal right common iliac artery measuring 1.7 cm. There is a small amount of irregular plaque in the right common iliac artery and this could represent a 0.5 cm penetrating ulcer. Right external iliac arteries are patent. Proximal right femoral arteries are patent. Dilatation of the left common iliac  artery measuring 1.6 cm. There is occlusion of the proximal left internal iliac iliac artery just beyond the origin with distal reconstitution of the left internal iliac branches. Left external iliac artery is widely patent. Proximal left femoral arteries are patent.  The gallbladder is distended and there is a small amount of fluid along the inferior aspect of the gallbladder. No definite gallstones. Limited evaluation of the portal venous system on this arterial study. There is concern for a trace amount of fluid around the spleen. No gross abnormality to the stomach or duodenum. No gross abnormality to the pancreas. No clear evidence for biliary or pancreatic duct dilatation. Normal appearance of both adrenal glands. Exophytic cystic structure along the medial aspect of the right kidney measures 1.9 cm. Few small densities  within this cystic structure could represent small septations. Multiple low-density structures associated with the left kidney probably represent parapelvic and cortical cysts. Incidentally, the patient has a retroaortic left renal vein. No significant lymphadenopathy. Normal appearance of the prostate and urinary bladder. Small left inguinal hernia containing fat. Diverticulosis in the sigmoid colon and left colon. Normal appearance of the terminal ileum. The appendix may have been surgically removed. No gross abnormality to the small or large bowel. No acute bone abnormalities.  Review of the MIP images confirms the above findings.  IMPRESSION: The gallbladder is distended with mild stranding around the gallbladder. Findings are concerning for acute acalculous cholecystitis based on the prior ultrasound findings.  Negative for aortic dissection. Mild dilatation of the ascending thoracic aorta.  Mild narrowing of the main left renal artery. Patient has bilateral accessory renal arteries.  Focal irregular plaque in the right common iliac artery. Findings could represent a small penetrating ulcer. This area roughly measures 0.5 cm.  Proximal occlusion of the left internal iliac artery.  Bilateral renal cysts. Exophytic cyst in the right kidney may contain septations. This area was not specifically evaluated on the recent ultrasound and consider a follow-up renal ultrasound to follow this cystic structure.   Electronically Signed   By: Richarda Overlie M.D.   On: 04/18/2013 13:22    Anti-infectives: Anti-infectives   Start     Dose/Rate Route Frequency Ordered Stop   04/19/13 1300  valACYclovir (VALTREX) tablet 500 mg     500 mg Oral Daily 04/19/13 1203     04/18/13 2300  piperacillin-tazobactam (ZOSYN) IVPB 3.375 g     3.375 g 12.5 mL/hr over 240 Minutes Intravenous 3 times per day 04/18/13 1715     04/18/13 1730  piperacillin-tazobactam (ZOSYN) IVPB 3.375 g     3.375 g 100 mL/hr over 30 Minutes Intravenous   Once 04/18/13 1715 04/18/13 1802       Assessment/Plan  1. Acute cholecystitis 2. CAD 3. HTN  Plan: 1. Cont zosyn as WBC increased today.  Patient still otherwise stable and ok to still give plavix an extra day to wear off. 2. Npo p mn and plan for OR tomorrow. 3. Clear liquids ok today.    LOS: 2 days    Doyal Saric E 04/20/2013, 9:19 AM Pager: [phone]

## 2013-04-21 ENCOUNTER — Encounter (HOSPITAL_COMMUNITY): Payer: Self-pay | Admitting: Critical Care Medicine

## 2013-04-21 ENCOUNTER — Inpatient Hospital Stay (HOSPITAL_COMMUNITY): Payer: Medicare Other

## 2013-04-21 ENCOUNTER — Inpatient Hospital Stay (HOSPITAL_COMMUNITY): Payer: Medicare Other | Admitting: Anesthesiology

## 2013-04-21 ENCOUNTER — Encounter (HOSPITAL_COMMUNITY): Payer: Medicare Other | Admitting: Anesthesiology

## 2013-04-21 ENCOUNTER — Encounter (HOSPITAL_COMMUNITY)
Admission: EM | Disposition: A | Discharge status: Home / Self Care | Payer: Self-pay | Source: Home / Self Care | Attending: Internal Medicine

## 2013-04-21 DIAGNOSIS — I25810 Atherosclerosis of coronary artery bypass graft(s) without angina pectoris: Secondary | ICD-10-CM | POA: Diagnosis not present

## 2013-04-21 DIAGNOSIS — I10 Essential (primary) hypertension: Secondary | ICD-10-CM | POA: Diagnosis not present

## 2013-04-21 DIAGNOSIS — K810 Acute cholecystitis: Secondary | ICD-10-CM | POA: Diagnosis not present

## 2013-04-21 DIAGNOSIS — K811 Chronic cholecystitis: Secondary | ICD-10-CM | POA: Diagnosis not present

## 2013-04-21 DIAGNOSIS — R109 Unspecified abdominal pain: Secondary | ICD-10-CM | POA: Diagnosis not present

## 2013-04-21 DIAGNOSIS — E559 Vitamin D deficiency, unspecified: Secondary | ICD-10-CM | POA: Diagnosis not present

## 2013-04-21 DIAGNOSIS — K219 Gastro-esophageal reflux disease without esophagitis: Secondary | ICD-10-CM | POA: Diagnosis not present

## 2013-04-21 DIAGNOSIS — R1011 Right upper quadrant pain: Secondary | ICD-10-CM | POA: Diagnosis not present

## 2013-04-21 DIAGNOSIS — K8043 Calculus of bile duct with acute cholecystitis with obstruction: Secondary | ICD-10-CM | POA: Diagnosis not present

## 2013-04-21 DIAGNOSIS — I2510 Atherosclerotic heart disease of native coronary artery without angina pectoris: Secondary | ICD-10-CM | POA: Diagnosis not present

## 2013-04-21 DIAGNOSIS — R112 Nausea with vomiting, unspecified: Secondary | ICD-10-CM | POA: Diagnosis not present

## 2013-04-21 HISTORY — PX: CHOLECYSTECTOMY: SHX55

## 2013-04-21 HISTORY — PX: LAPAROSCOPIC CHOLECYSTECTOMY: SUR755

## 2013-04-21 LAB — COMPREHENSIVE METABOLIC PANEL
ALT: 30 U/L (ref 0–53)
AST: 60 U/L — ABNORMAL HIGH (ref 0–37)
Albumin: 2.5 g/dL — ABNORMAL LOW (ref 3.5–5.2)
CO2: 23 mEq/L (ref 19–32)
Calcium: 8 mg/dL — ABNORMAL LOW (ref 8.4–10.5)
GFR calc Af Amer: >90 mL/min (ref >90)
Glucose, Bld: 108 mg/dL — ABNORMAL HIGH (ref 70–99)
Potassium: 3.8 mEq/L (ref 3.5–5.1)
Sodium: 134 mEq/L — ABNORMAL LOW (ref 135–145)
Total Protein: 5.5 g/dL — ABNORMAL LOW (ref 6.0–8.3)

## 2013-04-21 LAB — SURGICAL PCR SCREEN: Staphylococcus aureus: POSITIVE — AB

## 2013-04-21 LAB — CBC
HCT: 34.8 % — ABNORMAL LOW (ref 39.0–52.0)
Hemoglobin: 12.1 g/dL — ABNORMAL LOW (ref 13.0–17.0)
MCH: 32 pg (ref 26.0–34.0)
MCHC: 34.8 g/dL (ref 30.0–36.0)

## 2013-04-21 SURGERY — LAPAROSCOPIC CHOLECYSTECTOMY WITH INTRAOPERATIVE CHOLANGIOGRAM
Anesthesia: General

## 2013-04-21 MED ORDER — FENTANYL CITRATE 0.05 MG/ML IJ SOLN
INTRAMUSCULAR | Status: DC | PRN
  Administered 2013-04-21 (×4): 50 ug via INTRAVENOUS

## 2013-04-21 MED ORDER — HYDROMORPHONE HCL PF 1 MG/ML IJ SOLN
0.2500 mg | INTRAMUSCULAR | Status: DC | PRN
  Administered 2013-04-21 (×4): 0.5 mg via INTRAVENOUS

## 2013-04-21 MED ORDER — LACTATED RINGERS IV SOLN
INTRAVENOUS | Status: DC | PRN
  Administered 2013-04-21 (×2): via INTRAVENOUS

## 2013-04-21 MED ORDER — MIDAZOLAM HCL 5 MG/5ML IJ SOLN
INTRAMUSCULAR | Status: DC | PRN
  Administered 2013-04-21: 1 mg via INTRAVENOUS

## 2013-04-21 MED ORDER — GLYCOPYRROLATE 0.2 MG/ML IJ SOLN
INTRAMUSCULAR | Status: DC | PRN
  Administered 2013-04-21: .8 mg via INTRAVENOUS

## 2013-04-21 MED ORDER — 0.9 % SODIUM CHLORIDE (POUR BTL) OPTIME
TOPICAL | Status: DC | PRN
  Administered 2013-04-21: 1000 mL

## 2013-04-21 MED ORDER — CHLORHEXIDINE GLUCONATE CLOTH 2 % EX PADS: 6.0000 | MEDICATED_PAD | Freq: Every day | CUTANEOUS | Status: DC

## 2013-04-21 MED ORDER — SODIUM CHLORIDE 0.9 % IV SOLN
INTRAVENOUS | Status: DC | PRN
  Administered 2013-04-21: 15:00:00 via INTRAVENOUS

## 2013-04-21 MED ORDER — ONDANSETRON HCL 4 MG/2ML IJ SOLN
INTRAMUSCULAR | Status: DC | PRN
  Administered 2013-04-21: 4 mg via INTRAVENOUS

## 2013-04-21 MED ORDER — HYDRALAZINE HCL 20 MG/ML IJ SOLN: 10.0000 mg | Freq: Four times a day (QID) | INTRAMUSCULAR | Status: DC | PRN

## 2013-04-21 MED ORDER — SODIUM CHLORIDE 0.9 % IR SOLN
Status: DC | PRN
  Administered 2013-04-21: 1000 mL

## 2013-04-21 MED ORDER — BUPIVACAINE HCL (PF) 0.25 % IJ SOLN
INTRAMUSCULAR | Status: AC
  Filled 2013-04-21: qty 30

## 2013-04-21 MED ORDER — HYDROMORPHONE HCL PF 1 MG/ML IJ SOLN
2.0000 mg | INTRAMUSCULAR | Status: DC | PRN
  Administered 2013-04-21 – 2013-04-22 (×2): 2 mg via INTRAVENOUS
  Filled 2013-04-21 (×2): qty 2

## 2013-04-21 MED ORDER — LIDOCAINE HCL (CARDIAC) 20 MG/ML IV SOLN
INTRAVENOUS | Status: DC | PRN
  Administered 2013-04-21: 100 mg via INTRAVENOUS

## 2013-04-21 MED ORDER — ROCURONIUM BROMIDE 100 MG/10ML IV SOLN
INTRAVENOUS | Status: DC | PRN
  Administered 2013-04-21: 50 mg via INTRAVENOUS

## 2013-04-21 MED ORDER — SODIUM CHLORIDE 0.9 % IV SOLN
INTRAVENOUS | Status: DC | PRN
  Administered 2013-04-21: 16:00:00

## 2013-04-21 MED ORDER — KCL IN DEXTROSE-NACL 20-5-0.45 MEQ/L-%-% IV SOLN
INTRAVENOUS | Status: DC
  Administered 2013-04-21: 100 mL/h via INTRAVENOUS
  Filled 2013-04-21 (×5): qty 1000

## 2013-04-21 MED ORDER — BUPIVACAINE HCL (PF) 0.25 % IJ SOLN
INTRAMUSCULAR | Status: DC | PRN
  Administered 2013-04-21: 25 mL

## 2013-04-21 MED ORDER — MUPIROCIN 2 % EX OINT
1.0000 "application " | TOPICAL_OINTMENT | Freq: Two times a day (BID) | CUTANEOUS | Status: DC
  Administered 2013-04-21 – 2013-04-22 (×2): 1 via NASAL
  Filled 2013-04-21 (×2): qty 22

## 2013-04-21 MED ORDER — HYDROMORPHONE HCL PF 1 MG/ML IJ SOLN
INTRAMUSCULAR | Status: AC
  Filled 2013-04-21: qty 1

## 2013-04-21 MED ORDER — PROPOFOL 10 MG/ML IV BOLUS
INTRAVENOUS | Status: DC | PRN
  Administered 2013-04-21: 150 mg via INTRAVENOUS

## 2013-04-21 MED ORDER — SUCCINYLCHOLINE CHLORIDE 20 MG/ML IJ SOLN
INTRAMUSCULAR | Status: DC | PRN
  Administered 2013-04-21: 120 mg via INTRAVENOUS

## 2013-04-21 MED ORDER — PROMETHAZINE HCL 25 MG/ML IJ SOLN: 6.2500 mg | INTRAMUSCULAR | Status: DC | PRN

## 2013-04-21 MED ORDER — NEOSTIGMINE METHYLSULFATE 1 MG/ML IJ SOLN
INTRAMUSCULAR | Status: DC | PRN
  Administered 2013-04-21: 5 mg via INTRAVENOUS

## 2013-04-21 MED ORDER — KCL IN DEXTROSE-NACL 20-5-0.45 MEQ/L-%-% IV SOLN
INTRAVENOUS | Status: AC
  Filled 2013-04-21: qty 1000

## 2013-04-21 SURGICAL SUPPLY — 57 items
ADH SKN CLS APL DERMABOND .7 (GAUZE/BANDAGES/DRESSINGS) ×1
APPLIER CLIP ROT 10 11.4 M/L (STAPLE) ×2
APR CLP MED LRG 11.4X10 (STAPLE) ×1
BAG SPEC RTRVL LRG 6X4 10 (ENDOMECHANICALS) ×1
BIOPATCH BLUE 3/4IN DISK W/1.5 (GAUZE/BANDAGES/DRESSINGS) ×1 IMPLANT
BLADE SURG ROTATE 9660 (MISCELLANEOUS) ×1 IMPLANT
CANISTER SUCTION 2500CC (MISCELLANEOUS) ×2 IMPLANT
CHLORAPREP W/TINT 26ML (MISCELLANEOUS) ×2 IMPLANT
CLIP APPLIE ROT 10 11.4 M/L (STAPLE) ×1 IMPLANT
CLSR STERI-STRIP ANTIMIC 1/2X4 (GAUZE/BANDAGES/DRESSINGS) ×1 IMPLANT
COVER MAYO STAND STRL (DRAPES) ×2 IMPLANT
COVER SURGICAL LIGHT HANDLE (MISCELLANEOUS) ×2 IMPLANT
DECANTER SPIKE VIAL GLASS SM (MISCELLANEOUS) ×2 IMPLANT
DERMABOND ADVANCED (GAUZE/BANDAGES/DRESSINGS) ×1
DERMABOND ADVANCED .7 DNX12 (GAUZE/BANDAGES/DRESSINGS) ×1 IMPLANT
DRAIN CHANNEL 19F RND (DRAIN) ×1 IMPLANT
DRAPE C-ARM 42X72 X-RAY (DRAPES) ×2 IMPLANT
DRAPE UTILITY 15X26 W/TAPE STR (DRAPE) ×4 IMPLANT
ELECT REM PT RETURN 9FT ADLT (ELECTROSURGICAL) ×2
ELECTRODE REM PT RTRN 9FT ADLT (ELECTROSURGICAL) ×1 IMPLANT
ENDOLOOP SUT PDS II  0 18 (SUTURE) ×1
ENDOLOOP SUT PDS II 0 18 (SUTURE) IMPLANT
EVACUATOR SILICONE 100CC (DRAIN) ×1 IMPLANT
FILTER SMOKE EVAC LAPAROSHD (FILTER) ×1 IMPLANT
GLOVE BIO SURGEON STRL SZ 6.5 (GLOVE) ×1 IMPLANT
GLOVE BIO SURGEON STRL SZ7.5 (GLOVE) ×1 IMPLANT
GLOVE BIOGEL PI IND STRL 6.5 (GLOVE) IMPLANT
GLOVE BIOGEL PI IND STRL 7.0 (GLOVE) IMPLANT
GLOVE BIOGEL PI IND STRL 7.5 (GLOVE) IMPLANT
GLOVE BIOGEL PI INDICATOR 6.5 (GLOVE) ×1
GLOVE BIOGEL PI INDICATOR 7.0 (GLOVE) ×1
GLOVE BIOGEL PI INDICATOR 7.5 (GLOVE) ×2
GLOVE ECLIPSE 8.0 STRL XLNG CF (GLOVE) ×1 IMPLANT
GLOVE EUDERMIC 7 POWDERFREE (GLOVE) ×2 IMPLANT
GLOVE SURG SS PI 7.0 STRL IVOR (GLOVE) ×1 IMPLANT
GOWN STRL NON-REIN LRG LVL3 (GOWN DISPOSABLE) ×6 IMPLANT
GOWN STRL REIN XL XLG (GOWN DISPOSABLE) ×2 IMPLANT
HEMOSTAT SNOW SURGICEL 2X4 (HEMOSTASIS) ×2 IMPLANT
KIT BASIN OR (CUSTOM PROCEDURE TRAY) ×2 IMPLANT
KIT ROOM TURNOVER OR (KITS) ×2 IMPLANT
NS IRRIG 1000ML POUR BTL (IV SOLUTION) ×2 IMPLANT
PAD ARMBOARD 7.5X6 YLW CONV (MISCELLANEOUS) ×2 IMPLANT
POUCH SPECIMEN RETRIEVAL 10MM (ENDOMECHANICALS) ×2 IMPLANT
SCISSORS LAP 5X35 DISP (ENDOMECHANICALS) ×2 IMPLANT
SET CHOLANGIOGRAPH 5 50 .035 (SET/KITS/TRAYS/PACK) ×2 IMPLANT
SET IRRIG TUBING LAPAROSCOPIC (IRRIGATION / IRRIGATOR) ×2 IMPLANT
SLEEVE ENDOPATH XCEL 5M (ENDOMECHANICALS) ×2 IMPLANT
SPECIMEN JAR SMALL (MISCELLANEOUS) ×2 IMPLANT
SPONGE GAUZE 4X4 12PLY (GAUZE/BANDAGES/DRESSINGS) ×1 IMPLANT
SUT ETHILON 2 0 PSLX (SUTURE) ×1 IMPLANT
SUT MNCRL AB 4-0 PS2 18 (SUTURE) ×2 IMPLANT
TOWEL OR 17X24 6PK STRL BLUE (TOWEL DISPOSABLE) ×2 IMPLANT
TOWEL OR 17X26 10 PK STRL BLUE (TOWEL DISPOSABLE) ×2 IMPLANT
TRAY LAPAROSCOPIC (CUSTOM PROCEDURE TRAY) ×2 IMPLANT
TROCAR XCEL BLUNT TIP 100MML (ENDOMECHANICALS) ×2 IMPLANT
TROCAR XCEL NON-BLD 11X100MML (ENDOMECHANICALS) ×2 IMPLANT
TROCAR XCEL NON-BLD 5MMX100MML (ENDOMECHANICALS) ×2 IMPLANT

## 2013-04-21 NOTE — Anesthesia Preprocedure Evaluation (Addendum)
Anesthesia Evaluation  Patient identified by MRN, date of birth, ID band Patient awake    Reviewed: Allergy & Precautions, H&P , NPO status , Patient's Chart, lab work & pertinent test results, reviewed documented beta blocker date and time   History of Anesthesia Complications Negative for: history of anesthetic complications  Airway Mallampati: II TM Distance: >3 FB Neck ROM: Full    Dental  (+) Teeth Intact and Dental Advisory Given   Pulmonary neg pulmonary ROS,          Cardiovascular hypertension, Pt. on home beta blockers + CAD  Hx CABG and later stents noted.   Neuro/Psych negative psych ROS   GI/Hepatic Neg liver ROS, GERD-  Medicated and Controlled,  Endo/Other    Renal/GU negative Renal ROS     Musculoskeletal   Abdominal   Peds  Hematology   Anesthesia Other Findings   Reproductive/Obstetrics                         Anesthesia Physical Anesthesia Plan  ASA: III  Anesthesia Plan: General   Post-op Pain Management:    Induction: Intravenous  Airway Management Planned: Oral ETT  Additional Equipment:   Intra-op Plan:   Post-operative Plan: Extubation in OR  Informed Consent: I have reviewed the patients History and Physical, chart, labs and discussed the procedure including the risks, benefits and alternatives for the proposed anesthesia with the patient or authorized representative who has indicated his/her understanding and acceptance.   Dental advisory given  Plan Discussed with: CRNA, Anesthesiologist and Surgeon  Anesthesia Plan Comments:        Anesthesia Quick Evaluation

## 2013-04-21 NOTE — Progress Notes (Signed)
[name]                                                                                Patient Demographics  Kevin Webb, is a 67 y.o. male, DOB - 11-18-1945, AVW:[id number]  Admit date - 04/18/2013   Admitting Physician Joseph Art, DO  Outpatient Primary MD for the patient is Marga Melnick, MD  LOS - 3   Chief Complaint  Patient presents with  . Chest Pain        Assessment & Plan   1. Chest pain which later became focused in the right upper quadrant secondary to acute cholecystitis. General surgery following the patient, on bowel rest with IV fluids and empiric IV antibiotics, scheduled for laparoscopic cholecystectomy on 04/21/2013.      2. History of CAD. Stable no acute issues, cardiology following, home medications continued with the exception of antiplatelet medications.    3. History of alcohol abuse. Counseled to quit alcohol abuse, placed on scheduled Librium, folic acid thiamine supplementation, CIWA protocol.    4. Hypertension continue Lopressor.    5. Dyslipidemia continue on statin.     Code Status: Full  Family Communication: None present  Disposition Plan: Home   Procedures CT scan abdomen pelvis   Consults  Gen. surgery, cardiology   Medications  Scheduled Meds: . atorvastatin  10 mg Oral q1800  . chlordiazePOXIDE  10 mg Oral TID  . Chlorhexidine Gluconate Cloth  6 each Topical Daily  . [START ON 04/22/2013] enoxaparin (LOVENOX) injection  40 mg Subcutaneous Q24H  . influenza vac split quadrivalent PF  0.5 mL Intramuscular Tomorrow-1000  . metoprolol tartrate  12.5 mg Oral BID  . mupirocin ointment  1 application Nasal BID  . pantoprazole  40 mg Oral Daily  . piperacillin-tazobactam (ZOSYN)  IV  3.375 g Intravenous Q8H  . ramipril  10 mg Oral Daily  . sodium chloride  3 mL Intravenous Q12H  . valACYclovir  500 mg Oral Daily   Continuous Infusions: . 0.9 % NaCl with KCl 40 mEq / L 75 mL/hr at 04/21/13 0207   PRN  Meds:.acetaminophen, acetaminophen, LORazepam, LORazepam, morphine injection, nitroGLYCERIN, ondansetron, pneumococcal 23 valent vaccine  DVT Prophylaxis  Lovenox    Lab Results  Component Value Date   PLT 97* 04/21/2013    Antibiotics    Anti-infectives   Start     Dose/Rate Route Frequency Ordered Stop   04/19/13 1300  valACYclovir (VALTREX) tablet 500 mg     500 mg Oral Daily 04/19/13 1203     04/18/13 2300  piperacillin-tazobactam (ZOSYN) IVPB 3.375 g     3.375 g 12.5 mL/hr over 240 Minutes Intravenous 3 times per day 04/18/13 1715     04/18/13 1730  piperacillin-tazobactam (ZOSYN) IVPB 3.375 g     3.375 g 100 mL/hr over 30 Minutes Intravenous  Once 04/18/13 1715 04/18/13 1802          Subjective:   Kevin Webb today has, No headache, No chest pain, improved right upper quadrant abdominal pain - No Nausea, No new weakness tingling or numbness, No Cough - SOB.   Objective:   Filed Vitals:   04/20/13 1350 04/20/13  2010 04/20/13 2138 04/21/13 0520  BP: 110/60  112/71 155/87  Pulse: 78  63 63  Temp: 101 F (38.3 C) 98.6 F (37 C) 98.5 F (36.9 C) 99.2 F (37.3 C)  TempSrc: Oral Oral Oral Oral  Resp: 18  24 20   Height:      Weight:      SpO2: 96%  92% 96%    Wt Readings from Last 3 Encounters:  04/18/13 89.449 kg (197 lb 3.2 oz)  04/18/13 89.449 kg (197 lb 3.2 oz)  01/17/13 95.709 kg (211 lb)     Intake/Output Summary (Last 24 hours) at 04/21/13 1045 Last data filed at 04/21/13 0523  Gross per 24 hour  Intake    480 ml  Output   1750 ml  Net  -1270 ml    Exam Awake Alert, Oriented X 3, No new F.N deficits, Normal affect [place].AT,PERRAL Supple Neck,No JVD, No cervical lymphadenopathy appriciated.  Symmetrical Chest wall movement, Good air movement bilaterally, CTAB RRR,No Gallops,Rubs or new Murmurs, No Parasternal Heave +ve B.Sounds, Abd Soft, mild right upper quadrant tenderness, No organomegaly appriciated, No rebound - guarding or rigidity. No  Cyanosis, Clubbing or edema, No new Rash or bruise     Data Review   Micro Results Recent Results (from the past 240 hour(s))  SURGICAL PCR SCREEN     Status: Abnormal   Collection Time    04/20/13 11:05 PM      Result Value Range Status   MRSA, PCR NEGATIVE  NEGATIVE Final   Staphylococcus aureus POSITIVE (*) NEGATIVE Final   Comment:            The Xpert SA Assay (FDA     approved for NASAL specimens     in patients over 42 years of age),     is one component of     a comprehensive surveillance     program.  Test performance has     been validated by [employer] for patients greater     than or equal to 32 year old.     It is not intended     to diagnose infection nor to     guide or monitor treatment.    Radiology Reports Dg Chest 2 View  04/18/2013   CLINICAL DATA:  Left chest pain, left flank pain.  EXAM: CHEST  2 VIEW  COMPARISON:  08/30/2010  FINDINGS: There is no focal parenchymal opacity, pleural effusion, or pneumothorax. The heart and mediastinal contours are unremarkable. There is evidence of prior CABG.  The osseous structures are unremarkable.  IMPRESSION: No active cardiopulmonary disease.   Electronically Signed   By: Elige Ko   On: 04/18/2013 07:43   Nm Hepatobiliary  04/18/2013   CLINICAL DATA:  Question of a calculus cholecystitis. Right upper quadrant and right back pain. Nausea, vomiting for 17 hr.  EXAM: NUCLEAR MEDICINE HEPATOBILIARY IMAGING  TECHNIQUE: Sequential images of the abdomen were obtained out to 60 minutes following intravenous administration of radiopharmaceutical.  COMPARISON:  Ultrasound and CT on 04/18/2013  RADIOPHARMACEUTICALS:  5.71mCi Tc-72m Choletec  FINDINGS: There is early, appropriate hepatic activity. Bowel activity is seen as early as 10 min. Despite 2 hr of imaging, no gallbladder filling is noted, consistent with acute cholecystitis. Incidental note is made of gastroesophageal reflux.  IMPRESSION: 1. No opacification  gallbladder, consistent with acute cholecystitis. 2. Note is made of gastroesophageal reflux.   Electronically Signed   By: Sandria Senter.D.  On: 04/18/2013 20:53   US Abdomen Complete  04/18/2013   CLINICAL DATA:  Epigastric and right upper abdominal pain and back pain.  EXAM: ULTRASOUND ABDOMEN COMPLETE  COMPARISON:  None.  FINDINGS: Gallbladder:  No gallstones or wall thickening visualized. No sonographic Murphy sign noted.  Common bile duct:  Diameter: 1.3 mm.  Liver:  No focal lesion identified. Within normal limits in parenchymal echogenicity.  IVC:  No abnormality visualized.  Pancreas:  The pancreas was obscured by bowel gas.  Spleen:  Size and appearance within normal limits.  Right Kidney:  Length: 13.1 cm. Echogenicity within normal limits. No mass or hydronephrosis visualized.  Left Kidney:  Length: 12.4 cm. Echogenicity within normal limits. There are parapelvic cysts. There is no evidence of hydronephrosis.  Abdominal aorta:  Bowel gas obscures the midportion of the abdominal aorta. The maximal diameter of the abdominal aorta is seen. Proximally it measures 2.9 cm.  Other findings:  No ascites is demonstrated.  IMPRESSION: 1. The gallbladder is normal in appearance with no evidence of stones nor findings to suggest acute cholecystitis. The common bile duct is normal in caliber. 2. The liver, spleen, and kidneys exhibit no acute abnormalities. The pancreas is obscured by bowel gas. 3. The visualized portions of the abdominal aorta appear normal.   Electronically Signed   By: David  [place]   On: 04/18/2013 09:49   Ct Angio Chest Aortic Dissect W &/or W/o  04/18/2013   CLINICAL DATA:  67 year old with abdominal pain.  EXAM: CT ANGIOGRAPHY CHEST, ABDOMEN AND PELVIS  TECHNIQUE: Multidetector CT imaging through the chest, abdomen and pelvis was performed using the standard protocol during bolus administration of intravenous contrast. Multiplanar reconstructed images including MIPs were obtained and  reviewed to evaluate the vascular anatomy.  CONTRAST:  OMNIPAQUE IOHEXOL 350 MG/ML SOLN  COMPARISON:  Ultrasound 04/18/2013  FINDINGS: CTA CHEST FINDINGS  Negative for an aortic dissection or intramural hematoma. Patient had a median sternotomy and CABG procedure. Ascending thoracic aorta is prominent for size measuring up to 4 cm. The great vessels are patent. Descending thoracic aorta measures up to 3.4 cm. The pulmonary artery is slightly ectatic but no evidence for large filling defects in the main pulmonary arteries. No evidence for mediastinal, hilar or axillary lymphadenopathy. No significant pericardial or pleural fluid. Mild dilatation of the distal esophagus. The trachea and mainstem bronchi are patent. Both lungs are clear without airspace disease or consolidation. No acute bone abnormality.  Review of the MIP images confirms the above findings.  CTA ABDOMEN AND PELVIS FINDINGS  The abdominal aorta is patent without dissection or aneurysm. There is flow in the celiac trunk, SMA and inferior mesenteric artery. Atherosclerotic calcifications in the abdominal aorta. The patient has bilateral accessory renal arteries. There is plaque and at least mild stenosis at the origin of the main left renal artery. Incidentally, the left gastric artery originates from the aorta and just above the celiac trunk. Dilatation of the proximal right common iliac artery measuring 1.7 cm. There is a small amount of irregular plaque in the right common iliac artery and this could represent a 0.5 cm penetrating ulcer. Right external iliac arteries are patent. Proximal right femoral arteries are patent. Dilatation of the left common iliac artery measuring 1.6 cm. There is occlusion of the proximal left internal iliac iliac artery just beyond the origin with distal reconstitution of the left internal iliac branches. Left external iliac artery is widely patent. Proximal left femoral arteries are patent.  The  gallbladder is  distended and there is a small amount of fluid along the inferior aspect of the gallbladder. No definite gallstones. Limited evaluation of the portal venous system on this arterial study. There is concern for a trace amount of fluid around the spleen. No gross abnormality to the stomach or duodenum. No gross abnormality to the pancreas. No clear evidence for biliary or pancreatic duct dilatation. Normal appearance of both adrenal glands. Exophytic cystic structure along the medial aspect of the right kidney measures 1.9 cm. Few small densities within this cystic structure could represent small septations. Multiple low-density structures associated with the left kidney probably represent parapelvic and cortical cysts. Incidentally, the patient has a retroaortic left renal vein. No significant lymphadenopathy. Normal appearance of the prostate and urinary bladder. Small left inguinal hernia containing fat. Diverticulosis in the sigmoid colon and left colon. Normal appearance of the terminal ileum. The appendix may have been surgically removed. No gross abnormality to the small or large bowel. No acute bone abnormalities.  Review of the MIP images confirms the above findings.  IMPRESSION: The gallbladder is distended with mild stranding around the gallbladder. Findings are concerning for acute acalculous cholecystitis based on the prior ultrasound findings.  Negative for aortic dissection. Mild dilatation of the ascending thoracic aorta.  Mild narrowing of the main left renal artery. Patient has bilateral accessory renal arteries.  Focal irregular plaque in the right common iliac artery. Findings could represent a small penetrating ulcer. This area roughly measures 0.5 cm.  Proximal occlusion of the left internal iliac artery.  Bilateral renal cysts. Exophytic cyst in the right kidney may contain septations. This area was not specifically evaluated on the recent ultrasound and consider a follow-up renal ultrasound to  follow this cystic structure.   Electronically Signed   By: Richarda Overlie M.D.   On: 04/18/2013 13:22    CBC  Recent Labs Lab 04/18/13 0713 04/19/13 0500 04/20/13 0442 04/21/13 0342  WBC 10.5 5.7 16.5* 11.3*  HGB 14.3 13.6 12.7* 12.1*  HCT 41.4 39.4 38.0* 34.8*  PLT 173 115* 114* 97*  MCV 92.2 91.6 92.9 92.1  MCH 31.8 31.6 31.1 32.0  MCHC 34.5 34.5 33.4 34.8  RDW 14.1 14.4 14.5 14.5    Chemistries   Recent Labs Lab 04/18/13 0713 04/19/13 0500 04/20/13 0442 04/21/13 0342  NA 137 136 134* 134*  K 3.9 3.3* 3.4* 3.8  CL 101 101 100 102  CO2 25 23 21 23   GLUCOSE 157* 126* 94 108*  BUN 7 12 15 11   CREATININE 0.89 0.98 1.00 0.96  CALCIUM 9.1 8.4 8.1* 8.0*  AST 32 31 43* 60*  ALT 22 18 23 30   ALKPHOS 47 57 67 72  BILITOT 0.4 1.3* 1.0 0.8   ------------------------------------------------------------------------------------------------------------------ estimated creatinine clearance is 83.6 ml/min (by C-G formula based on Cr of 0.96). ------------------------------------------------------------------------------------------------------------------  Recent Labs  04/18/13 1151  HGBA1C 6.0*   ------------------------------------------------------------------------------------------------------------------ No results found for this basename: CHOL, HDL, LDLCALC, TRIG, CHOLHDL, LDLDIRECT,  in the last 72 hours ------------------------------------------------------------------------------------------------------------------ No results found for this basename: TSH, T4TOTAL, FREET3, T3FREE, THYROIDAB,  in the last 72 hours ------------------------------------------------------------------------------------------------------------------ No results found for this basename: VITAMINB12, FOLATE, FERRITIN, TIBC, IRON, RETICCTPCT,  in the last 72 hours  Coagulation profile  Recent Labs Lab 04/18/13 0713  INR 1.00    No results found for this basename: DDIMER,  in the last 72  hours  Cardiac Enzymes  Recent Labs Lab 04/18/13 0713 04/18/13 1150 04/18/13 1538  TROPONINI <  0.30 <0.30 <0.30   ------------------------------------------------------------------------------------------------------------------ No components found with this basename: POCBNP,      Time Spent in minutes  35   Beatrice Ziehm K M.D on 04/21/2013 at 10:45 AM  Between 7am to 7pm - Pager - [phone]  After 7pm go to [email or web address] - password [email or web address]  And look for the night coverage person covering for me after hours  [name] Office  [phone]

## 2013-04-21 NOTE — Preoperative (Addendum)
Beta Blockers   Reason not to administer Beta Blockers:Not Applicable, 12/4 @2139   Pt took Metoprolol last evening on his regular schedule of one time each day.

## 2013-04-21 NOTE — Op Note (Signed)
Kevin Webb [facility] [birth year] [id number] 04/21/2013  Preoperative diagnosis: Acute acalculous cholecystitis  Postoperative diagnosis: Acute calculus cholecystitis, gangrenous  Procedure: Laparoscopic cholecystectomy, attempted cholangiogram  Surgeon: Currie Paris, MD, FACS  Assistant surgeon: Dr. Megan Mans   Anesthesia: General  Clinical History and Indications: This patient was admitted a few days ago with chest and right upper quadrant pain and evaluation indicated that he had a calculus cholecystitis. He was begun on antibiotics. He has been on Plavix so we waited until today to diminish the risk of perioperative bleeding. He comes to the operating room for cholecystectomy.Marland Kitchen  Description of procedure: The patient was seen in the preoperative area. I reviewed the plans for the procedure with him as well as the risks and complications. He had no further questions and wished to proceed.  The patient was taken to the operating room. After satisfactory general endotracheal anesthesia had been obtained the abdomen was prepped and draped. A time out was done.  0.25% plain Marcaine was used at all incisions. I made an umbilical incision, identified the fascia and opened that, and entered the peritoneal cavity under direct vision. A 0 Vicryl pursestring suture was placed and the Hasson cannula was introduced under direct vision and secured with the pursestring. The abdomen was inflated to 15 cm.  The camera was placed and there were no gross abnormalities. The patient was then placed in reverse Trendelenburg and tilted to the left. A 10/11 trocar was placed in the epigastrium and two 5 mm trochars placed laterally all under direct vision.  Once all the trochars were in place she is mobilize the omentum that wasLying over the gallbladder. The gallbladder was basically necrotic. It was aspirated and foul-smelling somewhat bloody-appearing material was suctioned out. I then began grasping and  dissecting around the area the cystic duct and was able to identify a segment of cystic duct and a segment of cystic artery. The cystic duct appeared necrotic but I thought I could see the junction of the duct and the common duct. I put a clip on the artery and one on the duct. I then placed a Cook catheter into the cystic duct but attempted cholangiograms I just got some extravasation and couldn't really get any dye to enter the common or cystic duct for certain. I therefore abandoned this attempt.  Using a PDS Endoloop I put a single loop on the cystic duct where I thought it was still viable and a second clip on the gallbladder side of the Endoloop. While we have been working on this dissection a stone was retrieved where the gallbladder had become necrotic and was leaking into the peritoneal cavity. We thought initially the patient had a calculus cholecystitis but very clearly been a single stone and I think it was in the very neck of the gallbladder obstructing the cystic duct.     Additional clips are placed on the cystic artery and it was divided. The gallbladder was then removed from below to above the coagulation current of the cautery. It was then placed in a bag to be retrieved later.The entire bed of the gallbladder was a fairly raw surfaces this was all edematous and necrotic gallbladder.  The abdomen was irrigated and a check for hemostasis along the bed of the gallbladder made.Cause of the raw surface I put some snow in long bed to reduce the chance of postoperative bleeding. I also placed a 19 Blake drain through the epigastric port and out the lateral port and placed along  the bed of the gallbladder. Once everything appeared to be dry we were able to move the camera to the epigastric port and removed the gallbladder through the umbilical port.  The abdomen was reinsufflated and a final check for hemostasis made. There is no evidence of bleeding or bile leakage. The lateral port Was  removed under direct vision and there was no bleeding. The umbilical site was closed with a pursestring, watching with the camera in the epigastric port. The abdomen was then deflated through the epigastric port and that was removed. Skin was closed with 4-0 Monocryl subcuticular and Dermabond.  The patient tolerated the procedure well. There were no operative complications. EBL was 150 cc. All counts were correct.  Currie Paris, MD, FACS 04/21/2013 4:09 PM

## 2013-04-21 NOTE — Progress Notes (Signed)
Gallbladder disorder  Assessment: Acute acalculous cholecystitis  Plan: Lap chole and IOC today. Reviewed plans with patient again and all questions answered   Subjective: Feels OK, still pain in RUQ, no nausea, ready to proceed to surgery  Objective: Vital signs in last 24 hours: Temp:  [98.5 F (36.9 C)-101 F (38.3 C)] 99.2 F (37.3 C) (12/05 0520) Pulse Rate:  [63-78] 63 (12/05 0520) Resp:  [18-24] 20 (12/05 0520) BP: (110-155)/(60-87) 155/87 mmHg (12/05 0520) SpO2:  [92 %-96 %] 96 % (12/05 0520) Last BM Date: 04/17/13  Intake/Output from previous day: 12/04 0701 - 12/05 0700 In: 480 [P.O.:480] Out: 1750 [Urine:1750]  Alert, comfortable Lungs clear Abd soft, except mild RUQ guarding and tenderness, no rebound, rest of abdomen is soft  Lab Results:  Results for orders placed during the hospital encounter of 04/18/13 (from the past 24 hour(s))  SURGICAL PCR SCREEN     Status: Abnormal   Collection Time    04/20/13 11:05 PM      Result Value Range   MRSA, PCR NEGATIVE  NEGATIVE   Staphylococcus aureus POSITIVE (*) NEGATIVE  COMPREHENSIVE METABOLIC PANEL     Status: Abnormal   Collection Time    04/21/13  3:42 AM      Result Value Range   Sodium 134 (*) 135 - 145 mEq/L   Potassium 3.8  3.5 - 5.1 mEq/L   Chloride 102  96 - 112 mEq/L   CO2 23  19 - 32 mEq/L   Glucose, Bld 108 (*) 70 - 99 mg/dL   BUN 11  6 - 23 mg/dL   Creatinine, Ser 4.09  0.50 - 1.35 mg/dL   Calcium 8.0 (*) 8.4 - 10.5 mg/dL   Total Protein 5.5 (*) 6.0 - 8.3 g/dL   Albumin 2.5 (*) 3.5 - 5.2 g/dL   AST 60 (*) 0 - 37 U/L   ALT 30  0 - 53 U/L   Alkaline Phosphatase 72  39 - 117 U/L   Total Bilirubin 0.8  0.3 - 1.2 mg/dL   GFR calc non Af Amer 85 (*) >90 mL/min   GFR calc Af Amer >90  >90 mL/min  CBC     Status: Abnormal   Collection Time    04/21/13  3:42 AM      Result Value Range   WBC 11.3 (*) 4.0 - 10.5 K/uL   RBC 3.78 (*) 4.22 - 5.81 MIL/uL   Hemoglobin 12.1 (*) 13.0 - 17.0 g/dL   HCT 81.1 (*) 91.4 - 78.2 %   MCV 92.1  78.0 - 100.0 fL   MCH 32.0  26.0 - 34.0 pg   MCHC 34.8  30.0 - 36.0 g/dL   RDW 95.6  21.3 - 08.6 %   Platelets 97 (*) 150 - 400 K/uL     Studies/Results Radiology     MEDS, Scheduled . atorvastatin  10 mg Oral q1800  . chlordiazePOXIDE  10 mg Oral TID  . Chlorhexidine Gluconate Cloth  6 each Topical Daily  . [START ON 04/22/2013] enoxaparin (LOVENOX) injection  40 mg Subcutaneous Q24H  . influenza vac split quadrivalent PF  0.5 mL Intramuscular Tomorrow-1000  . metoprolol tartrate  12.5 mg Oral BID  . mupirocin ointment  1 application Nasal BID  . pantoprazole  40 mg Oral Daily  . piperacillin-tazobactam (ZOSYN)  IV  3.375 g Intravenous Q8H  . ramipril  10 mg Oral Daily  . sodium chloride  3 mL Intravenous Q12H  . valACYclovir  500 mg Oral Daily       LOS: 3 days    Currie Paris, MD, [facility], [place] [phone]   04/21/2013 7:40 AM

## 2013-04-21 NOTE — Anesthesia Postprocedure Evaluation (Signed)
Anesthesia Post Note  Patient: Kevin Webb  Procedure(s) Performed: Procedure(s) (LRB): LAPAROSCOPIC CHOLECYSTECTOMY WITH INTRAOPERATIVE CHOLANGIOGRAM (N/A)  Anesthesia type: general  Patient location: PACU  Post pain: Pain level controlled  Post assessment: Patient's Cardiovascular Status Stable   Post vital signs: Reviewed and stable  Level of consciousness: sedated  Complications: No apparent anesthesia complications

## 2013-04-21 NOTE — Anesthesia Procedure Notes (Signed)
Procedure Name: Intubation Date/Time: 04/21/2013 2:29 PM Performed by: Darcey Nora B Pre-anesthesia Checklist: Patient identified, Emergency Drugs available, Suction available and Patient being monitored Patient Re-evaluated:Patient Re-evaluated prior to inductionOxygen Delivery Method: Circle system utilized Preoxygenation: Pre-oxygenation with 100% oxygen Intubation Type: IV induction Ventilation: Mask ventilation without difficulty Laryngoscope Size: Mac and 4 Grade View: Grade II Tube type: Oral Tube size: 8.0 mm Number of attempts: 2 Airway Equipment and Method: Stylet and Bougie stylet Placement Confirmation: ETT inserted through vocal cords under direct vision,  breath sounds checked- equal and bilateral and positive ETCO2 Secured at: 23 (cm at teeth) cm Tube secured with: Tape Dental Injury: Teeth and Oropharynx as per pre-operative assessment

## 2013-04-21 NOTE — Transfer of Care (Signed)
Immediate Anesthesia Transfer of Care Note  Patient: Kevin Webb  Procedure(s) Performed: Procedure(s): LAPAROSCOPIC CHOLECYSTECTOMY WITH INTRAOPERATIVE CHOLANGIOGRAM (N/A)  Patient Location: PACU  Anesthesia Type:General  Level of Consciousness: awake, alert , oriented and patient cooperative  Airway & Oxygen Therapy: Patient Spontanous Breathing and Patient connected to face mask oxygen  Post-op Assessment: Report given to PACU RN, Post -op Vital signs reviewed and stable and Patient moving all extremities  Post vital signs: Reviewed and stable  Complications: No apparent anesthesia complications

## 2013-04-22 ENCOUNTER — Inpatient Hospital Stay (HOSPITAL_COMMUNITY): Payer: Medicare Other

## 2013-04-22 DIAGNOSIS — R109 Unspecified abdominal pain: Secondary | ICD-10-CM | POA: Diagnosis not present

## 2013-04-22 DIAGNOSIS — J90 Pleural effusion, not elsewhere classified: Secondary | ICD-10-CM | POA: Diagnosis not present

## 2013-04-22 DIAGNOSIS — J9819 Other pulmonary collapse: Secondary | ICD-10-CM | POA: Diagnosis not present

## 2013-04-22 LAB — CBC
MCHC: 34.8 g/dL (ref 30.0–36.0)
MCV: 91.6 fL (ref 78.0–100.0)
Platelets: 120 10*3/uL — ABNORMAL LOW (ref 150–400)
RDW: 14.8 % (ref 11.5–15.5)
WBC: 9 10*3/uL (ref 4.0–10.5)

## 2013-04-22 LAB — COMPREHENSIVE METABOLIC PANEL
ALT: 92 U/L — ABNORMAL HIGH (ref 0–53)
Alkaline Phosphatase: 105 U/L (ref 39–117)
BUN: 8 mg/dL (ref 6–23)
Chloride: 100 mEq/L (ref 96–112)
Creatinine, Ser: 0.84 mg/dL (ref 0.50–1.35)
GFR calc Af Amer: >90 mL/min (ref >90)
Glucose, Bld: 148 mg/dL — ABNORMAL HIGH (ref 70–99)
Potassium: 4.1 mEq/L (ref 3.5–5.1)
Sodium: 130 mEq/L — ABNORMAL LOW (ref 135–145)
Total Bilirubin: 1.3 mg/dL — ABNORMAL HIGH (ref 0.3–1.2)
Total Protein: 5.6 g/dL — ABNORMAL LOW (ref 6.0–8.3)

## 2013-04-22 MED ORDER — HYDROCODONE-ACETAMINOPHEN 5-325 MG PO TABS
1.0000 | ORAL_TABLET | ORAL | Status: DC | PRN
  Administered 2013-04-22: 1 via ORAL
  Filled 2013-04-22: qty 1

## 2013-04-22 MED ORDER — HYDROCODONE-ACETAMINOPHEN 5-325 MG PO TABS: 1.0000 | ORAL_TABLET | Freq: Four times a day (QID) | ORAL | Status: DC | PRN

## 2013-04-22 MED ORDER — AMOXICILLIN-POT CLAVULANATE 875-125 MG PO TABS: 1.0000 | ORAL_TABLET | Freq: Two times a day (BID) | ORAL | Status: DC

## 2013-04-22 NOTE — Discharge Summary (Addendum)
[name]                                                                                   Kevin Webb, is a 67 y.o. male  DOB [birth year]  MRN [id number].  Admission date:  04/18/2013  Admitting Physician  Joseph Art, DO  Discharge Date:  04/22/2013   Primary MD  Marga Melnick, MD  Recommendations for primary care physician for things to follow:   Follow clinically   Admission Diagnosis  Unspecified essential hypertension [401.9] Coronary atherosclerosis of artery bypass graft [414.04] Esophageal reflux [530.81] Overweight(278.02) [278.02] Upper abdominal pain [789.09] Alcohol abuse, daily use [305.01] Chest pain [786.50]  Discharge Diagnosis  acute cholecystitis  Principal Problem:   Gallbladder disorder Active Problems:   Chest pain   Abdominal pain   Alcohol abuse, daily use      Past Medical History  Diagnosis Date  . CAD (coronary artery disease)     a.S/P CABG 12/00. b. cath 3/12: L-LAD ok, S-RI 95% tx with BMS/thrombectomy, S-AM-RCA ok, EF 60%.  Marland Kitchen HTN (hypertension)   . GERD (gastroesophageal reflux disease)   . Hyperlipidemia   . Hyperglycemia   . Vitamin D deficiency   . Colonic polyp     X2  . Peyronie disease   . Low testosterone     Dr Evelene Croon, [place] ,[place]  . Sinus bradycardia   . RBBB   . Diverticulitis 2008/2009    Past Surgical History  Procedure Laterality Date  . Colonoscopy w/ polypectomy  2004 & 2009     X 2; Dr Russella Dar; due 2014  . Coronary artery bypass graft  04/1999    4 vessels  . Vasectomy    . Coronary stent placement  2012    Plavix  . Coronary angioplasty       Discharge Condition: stable   Follow-up Information   Schedule an appointment as soon as possible for a visit with CCS,MD, MD.   Specialty:  General Surgery   Contact information:   [address] [place] [address] [place] [address] [phone]       Follow up with Marga Melnick, MD. Schedule an appointment as soon as possible for a visit  in 1 week.   Specialty:  Internal Medicine   Contact information:   [phone] [facility] [address] [place] [address] [phone]         Consults obtained - CCS, cardiology   Discharge Medications      Medication List         amoxicillin-clavulanate 875-125 MG per tablet  Commonly known as:  AUGMENTIN  Take 1 tablet by mouth 2 (two) times daily.     aspirin 325 MG tablet  Take 325 mg by mouth daily.     clonazePAM 0.5 MG tablet  Commonly known as:  KLONOPIN  1 qhs prn for restless legs     clopidogrel 75 MG tablet  Commonly known as:  PLAVIX  Take 1 tablet (75 mg total) by mouth daily.     co-enzyme Q-10 30 MG capsule  Take 30 mg by mouth daily.     Fish Oil 1000  MG Caps  1 tab po qd     GRAPE SEED PO  Take 1 capsule by mouth daily.     HYDROcodone-acetaminophen 5-325 MG per tablet  Commonly known as:  LORTAB  Take 1 tablet by mouth every 6 (six) hours as needed for moderate pain or severe pain.     metoprolol tartrate 25 MG tablet  Commonly known as:  LOPRESSOR  Take 0.5 tablets (12.5 mg total) by mouth 2 (two) times daily.     multivitamin tablet  Take 1 tablet by mouth daily.     niacin 1000 MG CR tablet  Commonly known as:  NIASPAN  Take 1 tablet (1,000 mg total) by mouth at bedtime.     nitroGLYCERIN 0.4 MG SL tablet  Commonly known as:  NITROSTAT  Place 1 tablet (0.4 mg total) under the tongue every 5 (five) minutes as needed.     RABEprazole 20 MG tablet  Commonly known as:  ACIPHEX  Take 1 tablet (20 mg total) by mouth daily.     ramipril 10 MG capsule  Commonly known as:  ALTACE  Take 1 capsule (10 mg total) by mouth daily.     rosuvastatin 40 MG tablet  Commonly known as:  CRESTOR  Take 1 tablet (40 mg total) by mouth daily.     SAW PALMETTO PO  Take 1 capsule by mouth daily.     SELENIUM PO  Take 1 tablet by mouth daily.     tadalafil 20 MG tablet  Commonly known as:  CIALIS  Take 20 mg by mouth daily.      valACYclovir 500 MG tablet  Commonly known as:  VALTREX  Take 1 tablet (500 mg total) by mouth daily.     vitamin C 1000 MG tablet  Take 1,000 mg by mouth daily.     Vitamin D3 5000 UNITS Caps  Take by mouth daily.     vitamin E 100 UNIT capsule  Take 100 Units by mouth daily.         Diet and Activity recommendation: See Discharge Instructions below   Discharge Instructions     Laparoscopic Cholecystectomy Care After Refer to this sheet in the next few weeks. These instructions provide you with information on caring for yourself after your procedure. Your caregiver may also give you more specific instructions. Your treatment has been planned according to current medical practices, but problems sometimes occur. Call your caregiver if you have any problems or questions after your procedure. HOME CARE INSTRUCTIONS   Change bandages (dressings) as directed by your caregiver.  Keep the wound dry and clean. The wound may be washed gently with soap and water. Gently blot or dab the area dry.  Do not take baths or use swimming pools or hot tubs for 10 days, or as instructed by your caregiver.  Only take over-the-counter or prescription medicines for pain, discomfort, or fever as directed by your caregiver.  Continue your normal diet as directed by your caregiver.  Do not lift anything heavier than 25 pounds (11.5 kg), or as directed by your caregiver.  Do not play contact sports for 1 week, or as directed by your caregiver. SEEK MEDICAL CARE IF:   There is redness, swelling, or increasing pain in the wound.  You notice yellowish-white fluid (pus) coming from the wound.  There is drainage from the wound that lasts longer than 1 day.  There is a bad smell coming from the wound or dressing.  The surgical  cut (incision) breaks open. SEEK IMMEDIATE MEDICAL CARE IF:   You develop a rash.  You have difficulty breathing.  You develop chest pain.  You develop any  reaction or side effects to medicines given.  You have a fever.  You have increasing pain in the shoulders (shoulder strap areas).  You have dizzy episodes or faint while standing.  You develop severe abdominal pain.  You feel sick to your stomach (nauseous) or throw up (vomit) and this lasts for more than 1 day. MAKE SURE YOU:   Understand these instructions.  Will watch your condition.  Will get help right away if you are not doing well or get worse. Document Released: 05/04/2005 Document Revised: 07/27/2011 Document Reviewed: 12/14/2012 [facility] Patient Information 2014 [place], [place].   Follow with Primary MD Marga Melnick, MD in 3 days   Get CBC, CMP, checked 3 days by Primary MD and again as instructed by your Primary MD.   Get Medicines reviewed and adjusted.  Please request your Prim.MD to go over all Hospital Tests and Procedure/Radiological results at the follow up, please get all Hospital records sent to your Prim MD by signing hospital release before you go home.  Activity: As tolerated with Full fall precautions use walker/cane & assistance as needed   Diet: Heart healthy  For Heart failure patients - Check your Weight same time everyday, if you gain over 2 pounds, or you develop in leg swelling, experience more shortness of breath or chest pain, call your Primary MD immediately. Follow Cardiac Low Salt Diet and 1.8 lit/day fluid restriction.  Disposition Home  If you experience worsening of your admission symptoms, develop shortness of breath, life threatening emergency, suicidal or homicidal thoughts you must seek medical attention immediately by calling 911 or calling your MD immediately  if symptoms less severe.  You Must read complete instructions/literature along with all the possible adverse reactions/side effects for all the Medicines you take and that have been prescribed to you. Take any new Medicines after you have completely understood and accpet  all the possible adverse reactions/side effects.   Do not drive and provide baby sitting services if your were admitted for syncope or siezures until you have seen by Primary MD or a Neurologist and advised to do so again.  Do not drive when taking Pain medications.    Do not take more than prescribed Pain, Sleep and Anxiety Medications  Special Instructions: If you have smoked or chewed Tobacco  in the last 2 yrs please stop smoking, stop any regular Alcohol  and or any Recreational drug use.  Wear Seat belts while driving.   Please note  You were cared for by a hospitalist during your hospital stay. If you have any questions about your discharge medications or the care you received while you were in the hospital after you are discharged, you can call the unit and asked to speak with the hospitalist on call if the hospitalist that took care of you is not available. Once you are discharged, your primary care physician will handle any further medical issues. Please note that NO REFILLS for any discharge medications will be authorized once you are discharged, as it is imperative that you return to your primary care physician (or establish a relationship with a primary care physician if you do not have one) for your aftercare needs so that they can reassess your need for medications and monitor your lab values.   Major procedures and Radiology Reports - PLEASE review  detailed and final reports for all details, in brief -   Lap Cholycystectomy   Dg Chest 2 View  04/18/2013   CLINICAL DATA:  Left chest pain, left flank pain.  EXAM: CHEST  2 VIEW  COMPARISON:  08/30/2010  FINDINGS: There is no focal parenchymal opacity, pleural effusion, or pneumothorax. The heart and mediastinal contours are unremarkable. There is evidence of prior CABG.  The osseous structures are unremarkable.  IMPRESSION: No active cardiopulmonary disease.   Electronically Signed   By: Elige Ko   On: 04/18/2013 07:43    Nm Hepatobiliary  04/18/2013   CLINICAL DATA:  Question of a calculus cholecystitis. Right upper quadrant and right back pain. Nausea, vomiting for 17 hr.  EXAM: NUCLEAR MEDICINE HEPATOBILIARY IMAGING  TECHNIQUE: Sequential images of the abdomen were obtained out to 60 minutes following intravenous administration of radiopharmaceutical.  COMPARISON:  Ultrasound and CT on 04/18/2013  RADIOPHARMACEUTICALS:  5.22mCi Tc-32m Choletec  FINDINGS: There is early, appropriate hepatic activity. Bowel activity is seen as early as 10 min. Despite 2 hr of imaging, no gallbladder filling is noted, consistent with acute cholecystitis. Incidental note is made of gastroesophageal reflux.  IMPRESSION: 1. No opacification gallbladder, consistent with acute cholecystitis. 2. Note is made of gastroesophageal reflux.   Electronically Signed   By: Rosalie Gums M.D.   On: 04/18/2013 20:53   US Abdomen Complete  04/18/2013   CLINICAL DATA:  Epigastric and right upper abdominal pain and back pain.  EXAM: ULTRASOUND ABDOMEN COMPLETE  COMPARISON:  None.  FINDINGS: Gallbladder:  No gallstones or wall thickening visualized. No sonographic Murphy sign noted.  Common bile duct:  Diameter: 1.3 mm.  Liver:  No focal lesion identified. Within normal limits in parenchymal echogenicity.  IVC:  No abnormality visualized.  Pancreas:  The pancreas was obscured by bowel gas.  Spleen:  Size and appearance within normal limits.  Right Kidney:  Length: 13.1 cm. Echogenicity within normal limits. No mass or hydronephrosis visualized.  Left Kidney:  Length: 12.4 cm. Echogenicity within normal limits. There are parapelvic cysts. There is no evidence of hydronephrosis.  Abdominal aorta:  Bowel gas obscures the midportion of the abdominal aorta. The maximal diameter of the abdominal aorta is seen. Proximally it measures 2.9 cm.  Other findings:  No ascites is demonstrated.  IMPRESSION: 1. The gallbladder is normal in appearance with no evidence of stones nor  findings to suggest acute cholecystitis. The common bile duct is normal in caliber. 2. The liver, spleen, and kidneys exhibit no acute abnormalities. The pancreas is obscured by bowel gas. 3. The visualized portions of the abdominal aorta appear normal.   Electronically Signed   By: David  [place]   On: 04/18/2013 09:49   Dg Chest Port 1 View  04/20/2013   CLINICAL DATA:  Cough  EXAM: PORTABLE CHEST - 1 VIEW  COMPARISON:  04/18/2013  FINDINGS: Progression of cardiac enlargement and vascular congestion. Negative for edema or effusion. Prior CABG. Mild atelectasis in the lung bases.  IMPRESSION: Cardiac enlargement with mild vascular congestion.  No edema  Low lung volumes with atelectasis in both bases.   Electronically Signed   By: Marlan Palau M.D.   On: 04/20/2013 08:21   Dg C-arm 1-60 Min-no Report  04/21/2013   CLINICAL DATA: OR 1   C-ARM 1-60 MINUTES  Fluoroscopy was utilized by the requesting physician.  No radiographic  interpretation.    Ct Angio Chest Aortic Dissect W &/or W/o  04/18/2013   CLINICAL  DATA:  67 year old with abdominal pain.  EXAM: CT ANGIOGRAPHY CHEST, ABDOMEN AND PELVIS  TECHNIQUE: Multidetector CT imaging through the chest, abdomen and pelvis was performed using the standard protocol during bolus administration of intravenous contrast. Multiplanar reconstructed images including MIPs were obtained and reviewed to evaluate the vascular anatomy.  CONTRAST:  OMNIPAQUE IOHEXOL 350 MG/ML SOLN  COMPARISON:  Ultrasound 04/18/2013  FINDINGS: CTA CHEST FINDINGS  Negative for an aortic dissection or intramural hematoma. Patient had a median sternotomy and CABG procedure. Ascending thoracic aorta is prominent for size measuring up to 4 cm. The great vessels are patent. Descending thoracic aorta measures up to 3.4 cm. The pulmonary artery is slightly ectatic but no evidence for large filling defects in the main pulmonary arteries. No evidence for mediastinal, hilar or axillary  lymphadenopathy. No significant pericardial or pleural fluid. Mild dilatation of the distal esophagus. The trachea and mainstem bronchi are patent. Both lungs are clear without airspace disease or consolidation. No acute bone abnormality.  Review of the MIP images confirms the above findings.  CTA ABDOMEN AND PELVIS FINDINGS  The abdominal aorta is patent without dissection or aneurysm. There is flow in the celiac trunk, SMA and inferior mesenteric artery. Atherosclerotic calcifications in the abdominal aorta. The patient has bilateral accessory renal arteries. There is plaque and at least mild stenosis at the origin of the main left renal artery. Incidentally, the left gastric artery originates from the aorta and just above the celiac trunk. Dilatation of the proximal right common iliac artery measuring 1.7 cm. There is a small amount of irregular plaque in the right common iliac artery and this could represent a 0.5 cm penetrating ulcer. Right external iliac arteries are patent. Proximal right femoral arteries are patent. Dilatation of the left common iliac artery measuring 1.6 cm. There is occlusion of the proximal left internal iliac iliac artery just beyond the origin with distal reconstitution of the left internal iliac branches. Left external iliac artery is widely patent. Proximal left femoral arteries are patent.  The gallbladder is distended and there is a small amount of fluid along the inferior aspect of the gallbladder. No definite gallstones. Limited evaluation of the portal venous system on this arterial study. There is concern for a trace amount of fluid around the spleen. No gross abnormality to the stomach or duodenum. No gross abnormality to the pancreas. No clear evidence for biliary or pancreatic duct dilatation. Normal appearance of both adrenal glands. Exophytic cystic structure along the medial aspect of the right kidney measures 1.9 cm. Few small densities within this cystic structure could  represent small septations. Multiple low-density structures associated with the left kidney probably represent parapelvic and cortical cysts. Incidentally, the patient has a retroaortic left renal vein. No significant lymphadenopathy. Normal appearance of the prostate and urinary bladder. Small left inguinal hernia containing fat. Diverticulosis in the sigmoid colon and left colon. Normal appearance of the terminal ileum. The appendix may have been surgically removed. No gross abnormality to the small or large bowel. No acute bone abnormalities.  Review of the MIP images confirms the above findings.  IMPRESSION: The gallbladder is distended with mild stranding around the gallbladder. Findings are concerning for acute acalculous cholecystitis based on the prior ultrasound findings.  Negative for aortic dissection. Mild dilatation of the ascending thoracic aorta.  Mild narrowing of the main left renal artery. Patient has bilateral accessory renal arteries.  Focal irregular plaque in the right common iliac artery. Findings could represent a small penetrating  ulcer. This area roughly measures 0.5 cm.  Proximal occlusion of the left internal iliac artery.  Bilateral renal cysts. Exophytic cyst in the right kidney may contain septations. This area was not specifically evaluated on the recent ultrasound and consider a follow-up renal ultrasound to follow this cystic structure.   Electronically Signed   By: Richarda Overlie M.D.   On: 04/18/2013 13:22   Ct Angio Abd/pel W/ And/or W/o  04/18/2013   CLINICAL DATA:  67 year old with abdominal pain.  EXAM: CT ANGIOGRAPHY CHEST, ABDOMEN AND PELVIS  TECHNIQUE: Multidetector CT imaging through the chest, abdomen and pelvis was performed using the standard protocol during bolus administration of intravenous contrast. Multiplanar reconstructed images including MIPs were obtained and reviewed to evaluate the vascular anatomy.  CONTRAST:  OMNIPAQUE IOHEXOL 350 MG/ML SOLN   COMPARISON:  Ultrasound 04/18/2013  FINDINGS: CTA CHEST FINDINGS  Negative for an aortic dissection or intramural hematoma. Patient had a median sternotomy and CABG procedure. Ascending thoracic aorta is prominent for size measuring up to 4 cm. The great vessels are patent. Descending thoracic aorta measures up to 3.4 cm. The pulmonary artery is slightly ectatic but no evidence for large filling defects in the main pulmonary arteries. No evidence for mediastinal, hilar or axillary lymphadenopathy. No significant pericardial or pleural fluid. Mild dilatation of the distal esophagus. The trachea and mainstem bronchi are patent. Both lungs are clear without airspace disease or consolidation. No acute bone abnormality.  Review of the MIP images confirms the above findings.  CTA ABDOMEN AND PELVIS FINDINGS  The abdominal aorta is patent without dissection or aneurysm. There is flow in the celiac trunk, SMA and inferior mesenteric artery. Atherosclerotic calcifications in the abdominal aorta. The patient has bilateral accessory renal arteries. There is plaque and at least mild stenosis at the origin of the main left renal artery. Incidentally, the left gastric artery originates from the aorta and just above the celiac trunk. Dilatation of the proximal right common iliac artery measuring 1.7 cm. There is a small amount of irregular plaque in the right common iliac artery and this could represent a 0.5 cm penetrating ulcer. Right external iliac arteries are patent. Proximal right femoral arteries are patent. Dilatation of the left common iliac artery measuring 1.6 cm. There is occlusion of the proximal left internal iliac iliac artery just beyond the origin with distal reconstitution of the left internal iliac branches. Left external iliac artery is widely patent. Proximal left femoral arteries are patent.  The gallbladder is distended and there is a small amount of fluid along the inferior aspect of the gallbladder. No  definite gallstones. Limited evaluation of the portal venous system on this arterial study. There is concern for a trace amount of fluid around the spleen. No gross abnormality to the stomach or duodenum. No gross abnormality to the pancreas. No clear evidence for biliary or pancreatic duct dilatation. Normal appearance of both adrenal glands. Exophytic cystic structure along the medial aspect of the right kidney measures 1.9 cm. Few small densities within this cystic structure could represent small septations. Multiple low-density structures associated with the left kidney probably represent parapelvic and cortical cysts. Incidentally, the patient has a retroaortic left renal vein. No significant lymphadenopathy. Normal appearance of the prostate and urinary bladder. Small left inguinal hernia containing fat. Diverticulosis in the sigmoid colon and left colon. Normal appearance of the terminal ileum. The appendix may have been surgically removed. No gross abnormality to the small or large bowel. No acute bone  abnormalities.  Review of the MIP images confirms the above findings.  IMPRESSION: The gallbladder is distended with mild stranding around the gallbladder. Findings are concerning for acute acalculous cholecystitis based on the prior ultrasound findings.  Negative for aortic dissection. Mild dilatation of the ascending thoracic aorta.  Mild narrowing of the main left renal artery. Patient has bilateral accessory renal arteries.  Focal irregular plaque in the right common iliac artery. Findings could represent a small penetrating ulcer. This area roughly measures 0.5 cm.  Proximal occlusion of the left internal iliac artery.  Bilateral renal cysts. Exophytic cyst in the right kidney may contain septations. This area was not specifically evaluated on the recent ultrasound and consider a follow-up renal ultrasound to follow this cystic structure.   Electronically Signed   By: Richarda Overlie M.D.   On: 04/18/2013  13:22    Micro Results      Recent Results (from the past 240 hour(s))  SURGICAL PCR SCREEN     Status: Abnormal   Collection Time    04/20/13 11:05 PM      Result Value Range Status   MRSA, PCR NEGATIVE  NEGATIVE Final   Staphylococcus aureus POSITIVE (*) NEGATIVE Final   Comment:            The Xpert SA Assay (FDA     approved for NASAL specimens     in patients over 60 years of age),     is one component of     a comprehensive surveillance     program.  Test performance has     been validated by [employer] for patients greater     than or equal to 56 year old.     It is not intended     to diagnose infection nor to     guide or monitor treatment.     History of present illness and  Hospital Course:     Kindly see H&P for history of present illness and admission details, please review complete Labs, Consult reports and Test reports for all details in brief Kevin Webb, is a 67 y.o. male, patient with history of  CAD, hypertension, GERD, dyslipidemia was admitted to the hospital for nonspecific chest/right upper quadrant pain which after workup was found to be secondary to acute cholecystitis. He was seen here by his own cardiologist in general surgery team. Underwent laparoscopic cholecystectomy which was suggestive of gangrene is cholecystitis, he is now clinically stable, still has a JP drain, have discussed his case with general surgeon on call Dr. Blair Dolphin who is in place the patient can be discharged home on oral antibiotics with outpatient followup with his surgery group in one week.  And today's symptom free has the no acute complaints, feeling back to his baseline tolerating diet well and eager to go home. He will commence all his home medications unchanged for his chronic medical issues.    Late after DC paperwork was done patient coughed a little amount of old black blood likely from the ET tube and local trauma during the surgery, CXR stable, NO SOB, NO  cough-fever, advised to see PCP and let him know if occurs again.    Today   Subjective:   Gagan Dillion today has no headache,no chest abdominal pain,no new weakness tingling or numbness, feels much better wants to go home today.    Objective:   Blood pressure 106/65, pulse 69, temperature 98.7 F (37.1 C), temperature source  Oral, resp. rate 18, height 5' 8.11" (1.73 m), weight 89.449 kg (197 lb 3.2 oz), SpO2 92.00%.   Intake/Output Summary (Last 24 hours) at 04/22/13 1052 Last data filed at 04/22/13 0700  Gross per 24 hour  Intake   2110 ml  Output   1420 ml  Net    690 ml    Exam Awake Alert, Oriented *3, No new F.N deficits, Normal affect [place].AT,PERRAL Supple Neck,No JVD, No cervical lymphadenopathy appriciated.  Symmetrical Chest wall movement, Good air movement bilaterally, CTAB RRR,No Gallops,Rubs or new Murmurs, No Parasternal Heave +ve B.Sounds, Abd Soft, Non tender, RUQ drain in place,  No organomegaly appriciated, No rebound -guarding or rigidity. No Cyanosis, Clubbing or edema, No new Rash or bruise  Data Review   CBC w Diff: Lab Results  Component Value Date   WBC 9.0 04/22/2013   HGB 12.6* 04/22/2013   HCT 36.2* 04/22/2013   PLT 120* 04/22/2013   LYMPHOPCT 24.3 10/31/2012   MONOPCT 8.4 10/31/2012   EOSPCT 0.8 10/31/2012   BASOPCT 0.2 10/31/2012    CMP: Lab Results  Component Value Date   NA 130* 04/22/2013   K 4.1 04/22/2013   CL 100 04/22/2013   CO2 21 04/22/2013   BUN 8 04/22/2013   CREATININE 0.84 04/22/2013   PROT 5.6* 04/22/2013   ALBUMIN 2.2* 04/22/2013   BILITOT 1.3* 04/22/2013   ALKPHOS 105 04/22/2013   AST 166* 04/22/2013   ALT 92* 04/22/2013  .   Total Time in preparing paper work, data evaluation and todays exam - 35 minutes  Leroy Sea M.D on 04/22/2013 at 10:52 AM  [name] Office  [phone]

## 2013-04-22 NOTE — Progress Notes (Signed)
Pt is being discharged home. Pt has been provided with discharge instructions. RN went over instructions with the patient RN answered all questions the patient had. Pt is to be discharged around 1830 after is has completed his IV antibiotics. Will continue to monitor

## 2013-04-22 NOTE — Progress Notes (Signed)
1 Day Post-Op  Subjective: Pt doing well with no complaints   Objective: Vital signs in last 24 hours: Temp:  [97.2 F (36.2 C)-99.9 F (37.7 C)] 98.7 F (37.1 C) (12/06 0408) Pulse Rate:  [59-73] 69 (12/06 0408) Resp:  [18-30] 18 (12/06 0408) BP: (106-158)/(54-84) 106/65 mmHg (12/06 0408) SpO2:  [88 %-98 %] 92 % (12/06 0408) Last BM Date: 04/17/13  Intake/Output from previous day: 12/05 0701 - 12/06 0700 In: 2110 [P.O.:360; I.V.:1750] Out: 1420 [Urine:1050; Drains:160; Blood:210] Intake/Output this shift:    General appearance: alert and cooperative GI: soft, non-tender; bowel sounds normal; no masses,  no organomegaly and JP SS, non bilious  Lab Results:   Recent Labs  04/21/13 0342 04/22/13 0436  WBC 11.3* 9.0  HGB 12.1* 12.6*  HCT 34.8* 36.2*  PLT 97* 120*   BMET  Recent Labs  04/21/13 0342 04/22/13 0436  NA 134* 130*  K 3.8 4.1  CL 102 100  CO2 23 21  GLUCOSE 108* 148*  BUN 11 8  CREATININE 0.96 0.84  CALCIUM 8.0* 7.8*   PT/INR No results found for this basename: LABPROT, INR,  in the last 72 hours ABG No results found for this basename: PHART, PCO2, PO2, HCO3,  in the last 72 hours  Studies/Results: Dg C-arm 1-60 Min-no Report  04/21/2013   CLINICAL DATA: OR 1   C-ARM 1-60 MINUTES  Fluoroscopy was utilized by the requesting physician.  No radiographic  interpretation.     Anti-infectives: Anti-infectives   Start     Dose/Rate Route Frequency Ordered Stop   04/19/13 1300  valACYclovir (VALTREX) tablet 500 mg     500 mg Oral Daily 04/19/13 1203     04/18/13 2300  piperacillin-tazobactam (ZOSYN) IVPB 3.375 g     3.375 g 12.5 mL/hr over 240 Minutes Intravenous 3 times per day 04/18/13 1715     04/18/13 1730  piperacillin-tazobactam (ZOSYN) IVPB 3.375 g     3.375 g 100 mL/hr over 30 Minutes Intravenous  Once 04/18/13 1715 04/18/13 1802      Assessment/Plan: s/p Procedure(s): LAPAROSCOPIC CHOLECYSTECTOMY WITH INTRAOPERATIVE CHOLANGIOGRAM  (N/A) Continue ABX therapy due to Post-op infection OK for D C home today after IV abx, would DC with a total of 5 days of abx Will f/u in clinic x 1 week for JP removal  LOS: 4 days    Marigene Ehlers., [facility] 04/22/2013

## 2013-04-24 ENCOUNTER — Telehealth (INDEPENDENT_AMBULATORY_CARE_PROVIDER_SITE_OTHER): Payer: Self-pay

## 2013-04-24 NOTE — Telephone Encounter (Signed)
Called and left message for patient to call our office follow up appointment scheduled on 05/02/13 @ 2:15 pm in [facility] per Dr. Derrell Lolling.

## 2013-04-24 NOTE — Telephone Encounter (Signed)
Message copied by Maryan Puls on Mon Apr 24, 2013 10:33 AM ------      Message from: Axel Filler      Created: Sat Apr 22, 2013  9:29 AM       Sorry to bother you about his guy, just wasn't sure who to send it to.            He'll need f/u in [facility] in 1 week for JP drain removal.            Thanks      AR ------

## 2013-04-24 NOTE — Telephone Encounter (Signed)
Patient called back and was given below message.  Patient agreeable and states understanding.

## 2013-04-25 ENCOUNTER — Ambulatory Visit (INDEPENDENT_AMBULATORY_CARE_PROVIDER_SITE_OTHER): Payer: Medicare Other | Admitting: Internal Medicine

## 2013-04-25 ENCOUNTER — Encounter: Payer: Self-pay | Admitting: Internal Medicine

## 2013-04-25 ENCOUNTER — Other Ambulatory Visit: Payer: Self-pay | Admitting: *Deleted

## 2013-04-25 VITALS — BP 170/100 | HR 64 | Temp 98.1°F | Wt 198.6 lb

## 2013-04-25 DIAGNOSIS — E871 Hypo-osmolality and hyponatremia: Secondary | ICD-10-CM | POA: Diagnosis not present

## 2013-04-25 DIAGNOSIS — Z8719 Personal history of other diseases of the digestive system: Secondary | ICD-10-CM

## 2013-04-25 DIAGNOSIS — R7401 Elevation of levels of liver transaminase levels: Secondary | ICD-10-CM

## 2013-04-25 DIAGNOSIS — D649 Anemia, unspecified: Secondary | ICD-10-CM | POA: Diagnosis not present

## 2013-04-25 MED ORDER — RAMIPRIL 10 MG PO CAPS: 10.0000 mg | ORAL_CAPSULE | Freq: Every day | ORAL | Status: DC

## 2013-04-25 MED ORDER — OXYCODONE-ACETAMINOPHEN 10-325 MG PO TABS: 1.0000 | ORAL_TABLET | Freq: Four times a day (QID) | ORAL | Status: DC | PRN

## 2013-04-25 NOTE — Patient Instructions (Signed)
Your next office appointment will be determined based upon review of your pending labs . Please report any significant change in your symptoms. Share results with all non [facility] medical staff seen

## 2013-04-25 NOTE — Progress Notes (Signed)
   Subjective:    Patient ID: Kevin Webb, male    DOB: Apr 17, 1946, 67 y.o.   MRN: [id number]  HPI  His complicated history was reviewed and data entered in the problem list. He had acute cholecystitis with necrosis; the surgeon was able to the laparoscopic procedure  Significantly he did exhibit hyponatremia, random hyperglycemia, elevated liver enzymes, and mild anemia on labs done during his hospitalization. A1c was 6% on 04/18/13.      Review of Systems At this time he denies chest pain, palpitations, shortness breath,  nausea, or vomiting. He has pain @ tube drain site.  He is on a clear liquid diet which he is advancing with the addition of oatmeal.  He has not been monitoring his blood pressure at home.   Compliant with anti hypertemsive medication. No lightheadedness or other adverse medication effect described.   Significant headaches, epistaxis,  claudication, paroxysmal nocturnal dyspnea, or edema absent.     Objective:   Physical Exam  Appears healthy and well-nourished & in no acute distress. He appears tired  No icterus  No carotid bruits are present.No neck pain distention present at 10 - 15 degrees. Thyroid normal to palpation  Heart rhythm and rate are normal with no significant murmurs or gallops. Recheck BP 160/98.  Chest is clear with no increased work of breathing  There is no evidence of aortic aneurysm or renal artery bruits  Abdomen soft with active bowel sounds.Exam limited by RUQ tube ; but  no organomegaly or masses.   No clubbing, cyanosis or edema present.  Pedal pulses are intact   No ischemic skin changes are present . Nails healthy    Alert and oriented. Strength, tone, DTRs reflexes normal          Assessment & Plan:  See Current Assessment & Plan in Problem List under specific Diagnosis

## 2013-04-25 NOTE — Telephone Encounter (Signed)
Ramipril refilled per protocol 

## 2013-04-25 NOTE — Progress Notes (Signed)
Pre visit review using our clinic review tool, if applicable. No additional management support is needed unless otherwise documented below in the visit note. 

## 2013-05-01 ENCOUNTER — Other Ambulatory Visit (INDEPENDENT_AMBULATORY_CARE_PROVIDER_SITE_OTHER): Payer: Medicare Other

## 2013-05-01 DIAGNOSIS — Z8719 Personal history of other diseases of the digestive system: Secondary | ICD-10-CM | POA: Diagnosis not present

## 2013-05-01 DIAGNOSIS — R7401 Elevation of levels of liver transaminase levels: Secondary | ICD-10-CM

## 2013-05-01 DIAGNOSIS — D649 Anemia, unspecified: Secondary | ICD-10-CM | POA: Diagnosis not present

## 2013-05-02 ENCOUNTER — Other Ambulatory Visit: Payer: Self-pay | Admitting: *Deleted

## 2013-05-02 ENCOUNTER — Ambulatory Visit: Payer: Medicare Other

## 2013-05-02 ENCOUNTER — Ambulatory Visit (INDEPENDENT_AMBULATORY_CARE_PROVIDER_SITE_OTHER): Payer: BC Managed Care – PPO | Admitting: General Surgery

## 2013-05-02 VITALS — BP 132/78 | HR 78 | Temp 98.0°F | Resp 18 | Ht 69.2 in | Wt 189.2 lb

## 2013-05-02 DIAGNOSIS — E785 Hyperlipidemia, unspecified: Secondary | ICD-10-CM

## 2013-05-02 DIAGNOSIS — K810 Acute cholecystitis: Secondary | ICD-10-CM

## 2013-05-02 LAB — CBC WITH DIFFERENTIAL/PLATELET
Basophils Relative: 0 % (ref 0.0–3.0)
Eosinophils Absolute: 0.1 10*3/uL (ref 0.0–0.7)
Eosinophils Relative: 0.5 % (ref 0.0–5.0)
HCT: 41.2 % (ref 39.0–52.0)
Hemoglobin: 13.4 g/dL (ref 13.0–17.0)
Lymphocytes Relative: 11.3 % — ABNORMAL LOW (ref 12.0–46.0)
Lymphs Abs: 1.2 10*3/uL (ref 0.7–4.0)
MCHC: 32.5 g/dL (ref 30.0–36.0)
MCV: 93.7 fl (ref 78.0–100.0)
Monocytes Absolute: 0.3 10*3/uL (ref 0.1–1.0)
Neutro Abs: 9.2 10*3/uL — ABNORMAL HIGH (ref 1.4–7.7)
Neutrophils Relative %: 85.3 % — ABNORMAL HIGH (ref 43.0–77.0)
RBC: 4.4 Mil/uL (ref 4.22–5.81)
WBC: 10.8 10*3/uL — ABNORMAL HIGH (ref 4.5–10.5)

## 2013-05-02 LAB — HEPATIC FUNCTION PANEL
ALT: 80 U/L — ABNORMAL HIGH (ref 0–53)
Albumin: 3.4 g/dL — ABNORMAL LOW (ref 3.5–5.2)
Alkaline Phosphatase: 160 U/L — ABNORMAL HIGH (ref 39–117)
Bilirubin, Direct: 0 mg/dL (ref 0.0–0.3)

## 2013-05-02 LAB — BASIC METABOLIC PANEL
CO2: 22 mEq/L (ref 19–32)
Calcium: 9.5 mg/dL (ref 8.4–10.5)
Chloride: 108 mEq/L (ref 96–112)
Creatinine, Ser: 1 mg/dL (ref 0.4–1.5)
Potassium: 5.2 mEq/L — ABNORMAL HIGH (ref 3.5–5.1)
Sodium: 145 mEq/L (ref 135–145)

## 2013-05-02 NOTE — Patient Instructions (Signed)
Clean the site of the drain with soap and water.  If you have any drainage, swelling, or pain at the drain site or your abdomen call us. If no problems you can take the Steri strips of in a couple day.   Call if you have any problems.

## 2013-05-02 NOTE — Progress Notes (Signed)
[name] [facility] 07-05-1945 [id number] 05/02/2013   History of Present Illness: Kevin Webb is a  67 y.o. male who presents today status post lap appy by Dr. Cyndia Bent.  Pathology reveals Gallbladder - ACUTE CHOLECYSTITIS WITH EXTENSIVE NECROSIS. No fever or pain, he has completed his course of antibiotics.  He is eating and doing well, with BM today.   Drain tubing was clear the body of the drain had some old bloody type drainage.  It was removed without difficulty.  Site is a little red at the suture site.  Site was steri stripped. NoGross and Clinical Informatio Specimen Clinical.  The patient is tolerating a regular diet, having normal bowel movements, has good pain control.  He  is back to most normal activities.   Physical Exam: BP 132/78  Pulse 78  Temp(Src) 98 F (36.7 C)  Resp 18  Ht 5' 9.2" (1.758 m)  Wt 85.821 kg (189 lb 3.2 oz)  BMI 27.77 kg/m2   Abd: soft, nontender, active bowel sounds, nondistended.  All incisions are well healed.  Impression: 1.  Acute appendicitis, s/p lap appy  Plan: He  is able to return to normal activities. He  may follow up on a prn basis. He can shower tomorrow, and call if he has any problems.

## 2013-05-06 ENCOUNTER — Other Ambulatory Visit: Payer: Self-pay | Admitting: Internal Medicine

## 2013-05-06 DIAGNOSIS — R7401 Elevation of levels of liver transaminase levels: Secondary | ICD-10-CM

## 2013-05-08 DIAGNOSIS — E291 Testicular hypofunction: Secondary | ICD-10-CM | POA: Diagnosis not present

## 2013-06-06 ENCOUNTER — Encounter (HOSPITAL_COMMUNITY): Payer: Self-pay | Admitting: Surgery

## 2013-06-06 NOTE — OR Nursing (Signed)
LATE ENTRY: Procedure end time entered 

## 2013-06-07 DIAGNOSIS — N4889 Other specified disorders of penis: Secondary | ICD-10-CM | POA: Diagnosis not present

## 2013-06-07 DIAGNOSIS — N529 Male erectile dysfunction, unspecified: Secondary | ICD-10-CM | POA: Diagnosis not present

## 2013-06-07 DIAGNOSIS — E291 Testicular hypofunction: Secondary | ICD-10-CM | POA: Diagnosis not present

## 2013-07-06 ENCOUNTER — Other Ambulatory Visit: Payer: Self-pay

## 2013-07-06 MED ORDER — RABEPRAZOLE SODIUM 20 MG PO TBEC: 20.0000 mg | DELAYED_RELEASE_TABLET | Freq: Every day | ORAL | Status: DC

## 2013-07-10 ENCOUNTER — Other Ambulatory Visit: Payer: Self-pay | Admitting: *Deleted

## 2013-07-10 MED ORDER — CLONAZEPAM 0.5 MG PO TABS: ORAL_TABLET | ORAL | Status: DC

## 2013-07-10 NOTE — Telephone Encounter (Signed)
Rx approved per Dr. Linna Darner and sent to the pharmacy by es-script.//AB/CMA

## 2013-08-03 ENCOUNTER — Telehealth: Payer: Self-pay | Admitting: Internal Medicine

## 2013-08-03 NOTE — Telephone Encounter (Signed)
Plavix cannot be taken with any  protein pump inhibitor . Ranitidine 150 mg twice a day before meals can be taken with Plavix.

## 2013-08-03 NOTE — Telephone Encounter (Signed)
[name] at patient's pharmacy is calling in regards to the patient's RABEprazole (ACIPHEX) 20 MG rx. The patient is due for a refill but Kevin Webb is concerned about the patient taking this medication with Plavix. States that he discourages anything accept Protonix being taken with Plavix and wants to know if the patient's RABEprazole (ACIPHEX) 20 MG medication can be changed to Protonix.  Also, the patient's insurance no longer covers his RABEprazole (ACIPHEX) 20 MG medication so an alternative or PA would be needed if Dr. Linna Darner does not want him to switch.

## 2013-08-03 NOTE — Telephone Encounter (Signed)
Left detailed message on pharmacy VM changing pts medication.

## 2013-08-09 DIAGNOSIS — IMO0002 Reserved for concepts with insufficient information to code with codable children: Secondary | ICD-10-CM | POA: Diagnosis not present

## 2013-08-14 ENCOUNTER — Telehealth: Payer: Self-pay

## 2013-08-14 NOTE — Telephone Encounter (Signed)
Medication List and allergies:  Reviewed and updated  90 day supply/mail order: na Local prescriptions: [facility]  Immunizations due:  PNA, Shingles  A/P:   No changes to FH, PSH or Personal Hx Flu vaccine--not this season Tdap--07/2008 PNA--never had vaccine Shingles--never had vaccine CCS--12/2009--Dr Stark--hx of adenomatous polyps--next 2016 PSA--10/2012--1.35  To Discuss with Provider: Not at this time

## 2013-08-15 ENCOUNTER — Encounter: Payer: Self-pay | Admitting: Internal Medicine

## 2013-08-15 ENCOUNTER — Ambulatory Visit (INDEPENDENT_AMBULATORY_CARE_PROVIDER_SITE_OTHER): Payer: Medicare Other | Admitting: Internal Medicine

## 2013-08-15 VITALS — BP 115/70 | HR 60 | Temp 97.9°F | Ht 68.7 in | Wt 192.0 lb

## 2013-08-15 DIAGNOSIS — I10 Essential (primary) hypertension: Secondary | ICD-10-CM | POA: Diagnosis not present

## 2013-08-15 DIAGNOSIS — G2581 Restless legs syndrome: Secondary | ICD-10-CM

## 2013-08-15 DIAGNOSIS — E559 Vitamin D deficiency, unspecified: Secondary | ICD-10-CM

## 2013-08-15 DIAGNOSIS — E785 Hyperlipidemia, unspecified: Secondary | ICD-10-CM | POA: Diagnosis not present

## 2013-08-15 DIAGNOSIS — K219 Gastro-esophageal reflux disease without esophagitis: Secondary | ICD-10-CM

## 2013-08-15 DIAGNOSIS — E291 Testicular hypofunction: Secondary | ICD-10-CM | POA: Diagnosis not present

## 2013-08-15 DIAGNOSIS — I25810 Atherosclerosis of coronary artery bypass graft(s) without angina pectoris: Secondary | ICD-10-CM | POA: Diagnosis not present

## 2013-08-15 DIAGNOSIS — R7309 Other abnormal glucose: Secondary | ICD-10-CM

## 2013-08-15 HISTORY — DX: Restless legs syndrome: G25.81

## 2013-08-15 LAB — VITAMIN D 25 HYDROXY (VIT D DEFICIENCY, FRACTURES): Vit D, 25-Hydroxy: 41 ng/mL (ref 30–89)

## 2013-08-15 MED ORDER — RAMIPRIL 10 MG PO CAPS: 10.0000 mg | ORAL_CAPSULE | Freq: Every day | ORAL | Status: DC

## 2013-08-15 MED ORDER — RANITIDINE HCL 300 MG PO TABS: 300.0000 mg | ORAL_TABLET | Freq: Every day | ORAL | Status: DC

## 2013-08-15 NOTE — Assessment & Plan Note (Signed)
Good compliance with medications, ambulatory BPs 140 or less. Last K+ slightly elevated, check a BMP

## 2013-08-15 NOTE — Assessment & Plan Note (Addendum)
AcipHex cost is an issue, request  transfer to ranitidin. Will send a new prescription

## 2013-08-15 NOTE — Progress Notes (Signed)
Subjective:    Patient ID: Kevin Webb, male    DOB: 1945/08/12, 68 y.o.   MRN: [id number]  DOS:  08/15/2013 Type of  visit:  New patient to me, transferring from Dr. Linna Darner. Chart reviewed CAD, good compliance with medications including aspirin and Plavix, reports easy bruising. Hypertension, good medication compliance, ambulatory BPs usually less than 140. Hypogonadism, ED, BPH--follow up by urology, on HRT, feeling well, on Cialis as needed. History of RLS, on clonazepam as needed   ROS Diet improved compared to previous years, he remains very active physically Denies recent chest pain or difficulty breathing. Denies nausea, vomiting, diarrhea or blood in the stools. No difficulty urinating or gross hematuria. No gum bleeding, headaches or backaches.   Past Medical History  Diagnosis Date  . CAD (coronary artery disease)     a.S/P CABG 12/00. b. cath 3/12: L-LAD ok, S-RI 95% tx with BMS/thrombectomy, S-AM-RCA ok, EF 60%.  Marland Kitchen HTN (hypertension)   . GERD (gastroesophageal reflux disease)   . Hyperlipidemia   . Hyperglycemia   . Vitamin D deficiency   . Colonic polyp     X2  . Peyronie disease   . Low testosterone     Dr [name], [place] ,[place]  . Sinus bradycardia   . RBBB   . Diverticulitis 2008/2009  . Acute cholecystitis 04/18/2013  . RLS (restless legs syndrome) 08/15/2013    Past Surgical History  Procedure Laterality Date  . Colonoscopy w/ polypectomy  2004 & 2009     X 2; Dr Fuller Plan; due 2014  . Coronary artery bypass graft  04/1999    4 vessels  . Vasectomy    . Coronary stent placement  2012    Plavix  . Coronary angioplasty    . Laparoscopic cholecystectomy  04/21/2013    Dr Margot Chimes; acutecholecystitis with necrosis  . Cholecystectomy N/A 04/21/2013    Procedure: LAPAROSCOPIC CHOLECYSTECTOMY WITH INTRAOPERATIVE CHOLANGIOGRAM;  Surgeon: Haywood Lasso, MD;  Location: [facility];  Service: General;  Laterality: N/A;    History   Social History  .  Marital Status: Single    Spouse Name: N/A    Number of Children: 1  . Years of Education: N/A   Occupational History  . plumber      plumber   Social History Main Topics  . Smoking status: Never Smoker   . Smokeless tobacco: Never Used  . Alcohol Use: No     Comment:  quit 04/18/13  . Drug Use: No  . Sexual Activity: Not on file   Other Topics Concern  . Not on file   Social History Narrative   Remarried 2014        Medication List       This list is accurate as of: 08/15/13  5:46 PM.  Always use your most recent med list.               aspirin 325 MG tablet  Take 325 mg by mouth daily.     clonazePAM 0.5 MG tablet  Commonly known as:  KLONOPIN  TAKE 1/2 TO 1 TABLET AT BEDTIME AS NEEDED.     clopidogrel 75 MG tablet  Commonly known as:  PLAVIX  Take 1 tablet (75 mg total) by mouth daily.     co-enzyme Q-10 30 MG capsule  Take 30 mg by mouth daily.     Fish Oil 1000 MG Caps  1 tab po qd     GRAPE SEED PO  Take  1 capsule by mouth daily.     metoprolol tartrate 25 MG tablet  Commonly known as:  LOPRESSOR  Take 0.5 tablets (12.5 mg total) by mouth 2 (two) times daily.     multivitamin tablet  Take 1 tablet by mouth daily.     niacin 1000 MG CR tablet  Commonly known as:  NIASPAN  Take 1 tablet (1,000 mg total) by mouth at bedtime.     nitroGLYCERIN 0.4 MG SL tablet  Commonly known as:  NITROSTAT  Place 1 tablet (0.4 mg total) under the tongue every 5 (five) minutes as needed.     ramipril 10 MG capsule  Commonly known as:  ALTACE  Take 1 capsule (10 mg total) by mouth daily.     ranitidine 300 MG tablet  Commonly known as:  ZANTAC  Take 1 tablet (300 mg total) by mouth at bedtime.     rosuvastatin 40 MG tablet  Commonly known as:  CRESTOR  Take 1 tablet (40 mg total) by mouth daily.     SAW PALMETTO PO  Take 1 capsule by mouth daily.     SELENIUM PO  Take 1 tablet by mouth daily.     tadalafil 20 MG tablet  Commonly known as:  CIALIS   Take 20 mg by mouth daily.     valACYclovir 500 MG tablet  Commonly known as:  VALTREX  Take 1 tablet (500 mg total) by mouth daily.     vitamin C 1000 MG tablet  Take 1,000 mg by mouth daily.     Vitamin D3 5000 UNITS Caps  Take by mouth daily.     vitamin E 100 UNIT capsule  Take 100 Units by mouth daily.           Objective:   Physical Exam BP 115/70  Pulse 60  Temp(Src) 97.9 F (36.6 C)  Ht 5' 8.7" (1.745 m)  Wt 192 lb (87.091 kg)  BMI 28.60 kg/m2  SpO2 99% General -- alert, well-developed, NAD.    HEENT-- Not pale. Or jaundice Lungs -- normal respiratory effort, no intercostal retractions, no accessory muscle use, and normal breath sounds.  Heart-- normal rate, regular rhythm, no murmur.  Abdomen-- Not distended, good bowel sounds,soft, non-tender. Extremities-- no pretibial edema bilaterally  Neurologic--  alert & oriented X3. Speech normal, gait normal, strength normal in all extremities.  Psych-- Cognition and judgment appear intact. Cooperative with normal attention span and concentration. No anxious or depressed appearing.     Assessment & Plan:

## 2013-08-15 NOTE — Assessment & Plan Note (Addendum)
History of low vitamin D, last level in 2013 was normal, check a level today. Reports he takes OTC vitamin D, 10,000 units?

## 2013-08-15 NOTE — Assessment & Plan Note (Addendum)
He seems to be doing well, asymptomatic, has nitroglycerin , last time he used it is when he had gallbladder surgery, he thought it was his heart. His on aspirin and Plavix, complained of easy bruising (Already discussed with cardiology ). Denies worrisome symptoms.  Recommend to call if bruising increase or his his blood in the stools, urine or if he has a headache or backache

## 2013-08-15 NOTE — Progress Notes (Signed)
Pre visit review using our clinic review tool, if applicable. No additional management support is needed unless otherwise documented below in the visit note. 

## 2013-08-15 NOTE — Assessment & Plan Note (Signed)
Previous  A1cs reviewed with the patient, check a  A1c

## 2013-08-15 NOTE — Assessment & Plan Note (Signed)
Last LDL more than 100, cards note reviewed, for now we'll continue with Crestor, recheck on return to the office, patient is not fasting today

## 2013-08-15 NOTE — Assessment & Plan Note (Signed)
Reports a history of RLS well controlled on clonazepam as needed UDS on return to the office

## 2013-08-15 NOTE — Assessment & Plan Note (Addendum)
Managed by urology, has HRT  pellets. T levels are high according to the patient, he feels very well. He also has ED, uses Cialis as needed

## 2013-08-15 NOTE — Patient Instructions (Signed)
Get your blood work before you leave     Next visit is for routine check up regards your blood sugar , blood pressure, cholesterol   in 4 months,    fasting Please make an appointment

## 2013-08-16 ENCOUNTER — Telehealth: Payer: Self-pay | Admitting: Internal Medicine

## 2013-08-16 LAB — BASIC METABOLIC PANEL
BUN: 11 mg/dL (ref 6–23)
CHLORIDE: 101 meq/L (ref 96–112)
CO2: 24 meq/L (ref 19–32)
Calcium: 8.8 mg/dL (ref 8.4–10.5)
Creatinine, Ser: 1.1 mg/dL (ref 0.4–1.5)
GFR: 74.37 mL/min (ref >60.00)
Glucose, Bld: 68 mg/dL — ABNORMAL LOW (ref 70–99)
Potassium: 4 mEq/L (ref 3.5–5.1)
SODIUM: 134 meq/L — AB (ref 135–145)

## 2013-08-16 LAB — HEMOGLOBIN A1C: Hgb A1c MFr Bld: 5.8 % (ref 4.6–6.5)

## 2013-08-16 NOTE — Telephone Encounter (Signed)
Relevant patient education assigned to patient using Emmi. ° °

## 2013-09-01 ENCOUNTER — Other Ambulatory Visit: Payer: Self-pay

## 2013-09-01 MED ORDER — PANTOPRAZOLE SODIUM 40 MG PO TBEC: 40.0000 mg | DELAYED_RELEASE_TABLET | Freq: Every day | ORAL | Status: DC

## 2013-09-01 NOTE — Telephone Encounter (Signed)
Pharmacy sent a fax stating prior authorization is needed for Rabeprazole. Since he is taking Clopidogrel, Omeprazole + Esomeprozile are contraindicated. So is it okay to try Pantoprazole 40 mg daily. Per Dr hopper this is fine. #90

## 2013-09-05 DIAGNOSIS — E291 Testicular hypofunction: Secondary | ICD-10-CM | POA: Diagnosis not present

## 2013-09-05 DIAGNOSIS — N529 Male erectile dysfunction, unspecified: Secondary | ICD-10-CM | POA: Diagnosis not present

## 2013-11-07 DIAGNOSIS — N4889 Other specified disorders of penis: Secondary | ICD-10-CM | POA: Diagnosis not present

## 2013-11-07 DIAGNOSIS — Z79899 Other long term (current) drug therapy: Secondary | ICD-10-CM | POA: Diagnosis not present

## 2013-11-07 DIAGNOSIS — E291 Testicular hypofunction: Secondary | ICD-10-CM | POA: Diagnosis not present

## 2013-11-07 DIAGNOSIS — N529 Male erectile dysfunction, unspecified: Secondary | ICD-10-CM | POA: Diagnosis not present

## 2013-11-07 DIAGNOSIS — N138 Other obstructive and reflux uropathy: Secondary | ICD-10-CM | POA: Diagnosis not present

## 2013-11-07 DIAGNOSIS — N401 Enlarged prostate with lower urinary tract symptoms: Secondary | ICD-10-CM | POA: Diagnosis not present

## 2013-11-09 ENCOUNTER — Emergency Department: Payer: Self-pay | Admitting: Internal Medicine

## 2013-11-09 DIAGNOSIS — S0990XA Unspecified injury of head, initial encounter: Secondary | ICD-10-CM | POA: Diagnosis not present

## 2013-11-09 DIAGNOSIS — S40019A Contusion of unspecified shoulder, initial encounter: Secondary | ICD-10-CM | POA: Diagnosis not present

## 2013-11-09 DIAGNOSIS — I10 Essential (primary) hypertension: Secondary | ICD-10-CM | POA: Diagnosis not present

## 2013-11-09 DIAGNOSIS — M25519 Pain in unspecified shoulder: Secondary | ICD-10-CM | POA: Diagnosis not present

## 2013-11-09 DIAGNOSIS — R51 Headache: Secondary | ICD-10-CM | POA: Diagnosis not present

## 2013-11-09 DIAGNOSIS — S52599A Other fractures of lower end of unspecified radius, initial encounter for closed fracture: Secondary | ICD-10-CM | POA: Diagnosis not present

## 2013-11-10 DIAGNOSIS — S52539A Colles' fracture of unspecified radius, initial encounter for closed fracture: Secondary | ICD-10-CM | POA: Diagnosis not present

## 2013-11-10 DIAGNOSIS — M67919 Unspecified disorder of synovium and tendon, unspecified shoulder: Secondary | ICD-10-CM | POA: Diagnosis not present

## 2013-11-15 ENCOUNTER — Ambulatory Visit: Payer: Medicare Other | Admitting: Internal Medicine

## 2013-11-15 DIAGNOSIS — N529 Male erectile dysfunction, unspecified: Secondary | ICD-10-CM | POA: Diagnosis not present

## 2013-11-15 DIAGNOSIS — E291 Testicular hypofunction: Secondary | ICD-10-CM | POA: Diagnosis not present

## 2013-11-21 ENCOUNTER — Ambulatory Visit (INDEPENDENT_AMBULATORY_CARE_PROVIDER_SITE_OTHER): Payer: Medicare Other | Admitting: Internal Medicine

## 2013-11-21 ENCOUNTER — Encounter: Payer: Self-pay | Admitting: Internal Medicine

## 2013-11-21 VITALS — BP 114/79 | HR 67 | Temp 98.2°F | Wt 193.8 lb

## 2013-11-21 DIAGNOSIS — E785 Hyperlipidemia, unspecified: Secondary | ICD-10-CM

## 2013-11-21 DIAGNOSIS — I10 Essential (primary) hypertension: Secondary | ICD-10-CM

## 2013-11-21 DIAGNOSIS — E291 Testicular hypofunction: Secondary | ICD-10-CM | POA: Diagnosis not present

## 2013-11-21 DIAGNOSIS — I25810 Atherosclerosis of coronary artery bypass graft(s) without angina pectoris: Secondary | ICD-10-CM

## 2013-11-21 DIAGNOSIS — E559 Vitamin D deficiency, unspecified: Secondary | ICD-10-CM | POA: Diagnosis not present

## 2013-11-21 DIAGNOSIS — J029 Acute pharyngitis, unspecified: Secondary | ICD-10-CM

## 2013-11-21 NOTE — Progress Notes (Signed)
Subjective:    Patient ID: Kevin Webb, male    DOB: 1945/09/05, 68 y.o.   MRN: [id number]  DOS:  11/21/2013 Type of visit - description: routine History: Developed a sore throat a few days ago, prescribe amoxicillin? Had an accident   2 weeks ago, went to the ER, diagnosed with a shoulder and left arm injury. At that time he felt a couple of knots  at the chest, they are gone but likes me to check. Hypertension, good medication compliance, ambulatory BPs usually normal, one day when he was in pain BP went up to 170/90. Wonders about testosterone replacement.    ROS Denies fever chills No signs pain, some sinus congestion. No nausea, vomiting, diarrhea. Endorses mild cough with clear sputum production  Past Medical History  Diagnosis Date  . CAD (coronary artery disease)     a.S/P CABG 12/00. b. cath 3/12: L-LAD ok, S-RI 95% tx with BMS/thrombectomy, S-AM-RCA ok, EF 60%.  Marland Kitchen HTN (hypertension)   . GERD (gastroesophageal reflux disease)   . Hyperlipidemia   . Hyperglycemia   . Vitamin D deficiency   . Colonic polyp     X2  . Peyronie disease   . Low testosterone     Dr [name], [place] ,[place]  . Sinus bradycardia   . RBBB   . Diverticulitis 2008/2009  . Acute cholecystitis 04/18/2013  . RLS (restless legs syndrome) 08/15/2013    Past Surgical History  Procedure Laterality Date  . Colonoscopy w/ polypectomy  2004 & 2009     X 2; Dr Fuller Plan; due 2014  . Coronary artery bypass graft  04/1999    4 vessels  . Vasectomy    . Coronary stent placement  2012    Plavix  . Coronary angioplasty    . Laparoscopic cholecystectomy  04/21/2013    Dr Margot Chimes; acutecholecystitis with necrosis  . Cholecystectomy N/A 04/21/2013    Procedure: LAPAROSCOPIC CHOLECYSTECTOMY WITH INTRAOPERATIVE CHOLANGIOGRAM;  Surgeon: Haywood Lasso, MD;  Location: [facility];  Service: General;  Laterality: N/A;    History   Social History  . Marital Status: Single    Spouse Name: N/A    Number of  Children: 1  . Years of Education: N/A   Occupational History  . plumber      plumber   Social History Main Topics  . Smoking status: Never Smoker   . Smokeless tobacco: Never Used  . Alcohol Use: No     Comment:  quit 04/18/13  . Drug Use: No  . Sexual Activity: Not on file   Other Topics Concern  . Not on file   Social History Narrative   Remarried 2014        Medication List       This list is accurate as of: 11/21/13  6:34 PM.  Always use your most recent med list.               aspirin 325 MG tablet  Take 325 mg by mouth daily.     clonazePAM 0.5 MG tablet  Commonly known as:  KLONOPIN  TAKE 1/2 TO 1 TABLET AT BEDTIME AS NEEDED.     clopidogrel 75 MG tablet  Commonly known as:  PLAVIX  Take 1 tablet (75 mg total) by mouth daily.     co-enzyme Q-10 30 MG capsule  Take 30 mg by mouth daily.     Fish Oil 1000 MG Caps  1 tab po qd  GRAPE SEED PO  Take 1 capsule by mouth daily.     HYDROcodone-acetaminophen 5-325 MG per tablet  Commonly known as:  NORCO/VICODIN  Take 1 tablet by mouth every 6 (six) hours as needed for moderate pain.     metoprolol tartrate 25 MG tablet  Commonly known as:  LOPRESSOR  Take 0.5 tablets (12.5 mg total) by mouth 2 (two) times daily.     multivitamin tablet  Take 1 tablet by mouth daily.     niacin 1000 MG CR tablet  Commonly known as:  NIASPAN  Take 1 tablet (1,000 mg total) by mouth at bedtime.     nitroGLYCERIN 0.4 MG SL tablet  Commonly known as:  NITROSTAT  Place 1 tablet (0.4 mg total) under the tongue every 5 (five) minutes as needed.     pantoprazole 40 MG tablet  Commonly known as:  PROTONIX  Take 1 tablet (40 mg total) by mouth daily.     ramipril 10 MG capsule  Commonly known as:  ALTACE  Take 1 capsule (10 mg total) by mouth daily.     ranitidine 300 MG tablet  Commonly known as:  ZANTAC  Take 1 tablet (300 mg total) by mouth at bedtime.     rosuvastatin 40 MG tablet  Commonly known as:   CRESTOR  Take 1 tablet (40 mg total) by mouth daily.     SAW PALMETTO PO  Take 1 capsule by mouth daily.     SELENIUM PO  Take 1 tablet by mouth daily.     tadalafil 20 MG tablet  Commonly known as:  CIALIS  Take 20 mg by mouth daily.     valACYclovir 500 MG tablet  Commonly known as:  VALTREX  Take 1 tablet (500 mg total) by mouth daily.     vitamin C 1000 MG tablet  Take 1,000 mg by mouth daily.     Vitamin D3 5000 UNITS Caps  Take by mouth daily.     vitamin E 100 UNIT capsule  Take 100 Units by mouth daily.           Objective:   Physical Exam BP 114/79  Pulse 67  Temp(Src) 98.2 F (36.8 C) (Oral)  Wt 193 lb 12.8 oz (87.907 kg)  SpO2 99%  General -- alert, well-developed, NAD.  HEENT-- Not pale. TMs normal, throat symmetric, no redness or discharge. Face symmetric, sinuses not tender to palpation. Nose slt congested. Chest wall-- no mass or tenderness, L pectoral area w/o wass , breasts wnl for male Lungs -- normal respiratory effort, no intercostal retractions, no accessory muscle use, and normal breath sounds.  Heart-- normal rate, regular rhythm, no murmur.  Extremities-- no pretibial edema bilaterally ; cast @ L arm Neurologic--  alert & oriented X3.   Psych-- Cognition and judgment appear intact. Cooperative with normal attention span and concentration. No anxious or depressed appearing.         Assessment & Plan:   ST--  Strep test negative, recommend conservative treatment Mass at chest ? Resolved, exam today is normal.

## 2013-11-21 NOTE — Patient Instructions (Addendum)
Please come back fasting for labs: FLP -- dx hyperlipidemia CBC --- dx hypertension   Rest, fluids If  cough, take Mucinex DM twice a day as needed  If nasal congestion use OTC Nasocort: 2 nasal sprays on each side of the nose daily until you feel better Call if no better in few days Call anytime if the symptoms are severe    Next visit in 4 months for a physical

## 2013-11-21 NOTE — Assessment & Plan Note (Signed)
Labs vitamin D normal

## 2013-11-21 NOTE — Assessment & Plan Note (Signed)
Patient wonders if a testosterone pellet is better than a gel. Advise pt that any replacement is ok as long as is monitored by provider  Encouraged to discuss with urology.

## 2013-11-21 NOTE — Progress Notes (Signed)
Pre visit review using our clinic review tool, if applicable. No additional management support is needed unless otherwise documented below in the visit note. 

## 2013-11-21 NOTE — Assessment & Plan Note (Signed)
Well-controlled, last BMP satisfactory, no change 

## 2013-11-21 NOTE — Assessment & Plan Note (Signed)
Good med compliance , labs  

## 2013-11-27 ENCOUNTER — Other Ambulatory Visit (INDEPENDENT_AMBULATORY_CARE_PROVIDER_SITE_OTHER): Payer: Medicare Other

## 2013-11-27 DIAGNOSIS — E785 Hyperlipidemia, unspecified: Secondary | ICD-10-CM

## 2013-11-27 DIAGNOSIS — I10 Essential (primary) hypertension: Secondary | ICD-10-CM | POA: Diagnosis not present

## 2013-11-28 LAB — LIPID PANEL
Cholesterol: 153 mg/dL (ref 0–200)
HDL: 48.9 mg/dL (ref >39.00)
LDL Cholesterol: 80 mg/dL (ref 0–99)
NONHDL: 104.1
TRIGLYCERIDES: 122 mg/dL (ref 0.0–149.0)
Total CHOL/HDL Ratio: 3
VLDL: 24.4 mg/dL (ref 0.0–40.0)

## 2013-11-28 LAB — CBC WITH DIFFERENTIAL/PLATELET
Basophils Absolute: 0 10*3/uL (ref 0.0–0.1)
Basophils Relative: 0.2 % (ref 0.0–3.0)
EOS PCT: 0.9 % (ref 0.0–5.0)
Eosinophils Absolute: 0.1 10*3/uL (ref 0.0–0.7)
HEMATOCRIT: 43.3 % (ref 39.0–52.0)
Hemoglobin: 14.6 g/dL (ref 13.0–17.0)
Lymphocytes Relative: 26 % (ref 12.0–46.0)
Lymphs Abs: 1.6 10*3/uL (ref 0.7–4.0)
MCHC: 33.7 g/dL (ref 30.0–36.0)
MCV: 92.1 fl (ref 78.0–100.0)
MONO ABS: 0.4 10*3/uL (ref 0.1–1.0)
Monocytes Relative: 7 % (ref 3.0–12.0)
NEUTROS ABS: 4 10*3/uL (ref 1.4–7.7)
Neutrophils Relative %: 65.9 % (ref 43.0–77.0)
Platelets: 185 10*3/uL (ref 150.0–400.0)
RBC: 4.7 Mil/uL (ref 4.22–5.81)
RDW: 16.1 % — AB (ref 11.5–15.5)
WBC: 6.1 10*3/uL (ref 4.0–10.5)

## 2013-11-29 ENCOUNTER — Encounter: Payer: Self-pay | Admitting: Internal Medicine

## 2013-12-01 DIAGNOSIS — S52539A Colles' fracture of unspecified radius, initial encounter for closed fracture: Secondary | ICD-10-CM | POA: Diagnosis not present

## 2013-12-07 ENCOUNTER — Other Ambulatory Visit: Payer: Self-pay | Admitting: *Deleted

## 2013-12-07 MED ORDER — PANTOPRAZOLE SODIUM 40 MG PO TBEC: 40.0000 mg | DELAYED_RELEASE_TABLET | Freq: Every day | ORAL | Status: DC

## 2013-12-15 ENCOUNTER — Other Ambulatory Visit: Payer: Self-pay | Admitting: *Deleted

## 2013-12-15 ENCOUNTER — Ambulatory Visit: Payer: Medicare Other | Admitting: Internal Medicine

## 2013-12-15 DIAGNOSIS — Z23 Encounter for immunization: Secondary | ICD-10-CM

## 2013-12-15 MED ORDER — ZOSTER VACCINE LIVE 19400 UNT/0.65ML ~~LOC~~ SOLR: 0.6500 mL | Freq: Once | SUBCUTANEOUS | Status: DC

## 2013-12-29 DIAGNOSIS — S52539A Colles' fracture of unspecified radius, initial encounter for closed fracture: Secondary | ICD-10-CM | POA: Diagnosis not present

## 2013-12-29 DIAGNOSIS — M67919 Unspecified disorder of synovium and tendon, unspecified shoulder: Secondary | ICD-10-CM | POA: Diagnosis not present

## 2014-01-15 ENCOUNTER — Other Ambulatory Visit: Payer: Self-pay | Admitting: *Deleted

## 2014-01-15 MED ORDER — METOPROLOL TARTRATE 25 MG PO TABS: 12.5000 mg | ORAL_TABLET | Freq: Two times a day (BID) | ORAL | Status: DC

## 2014-01-16 ENCOUNTER — Ambulatory Visit (INDEPENDENT_AMBULATORY_CARE_PROVIDER_SITE_OTHER): Payer: Medicare Other | Admitting: Cardiology

## 2014-01-16 ENCOUNTER — Encounter: Payer: Self-pay | Admitting: Cardiology

## 2014-01-16 VITALS — BP 134/76 | HR 58 | Ht 68.0 in | Wt 198.0 lb

## 2014-01-16 DIAGNOSIS — I10 Essential (primary) hypertension: Secondary | ICD-10-CM

## 2014-01-16 DIAGNOSIS — I25810 Atherosclerosis of coronary artery bypass graft(s) without angina pectoris: Secondary | ICD-10-CM

## 2014-01-16 NOTE — Progress Notes (Signed)
HPI The patient presents for evaluation of CAD.  Since I last saw him he has done well.  He has gotten married.  He did have acute cholecystitis and had his gallbladder removed in December. He was thrown from horse and has some joint injuries with this but has not required surgery.  The patient denies any new symptoms such as chest discomfort, neck or arm discomfort. There has been no new shortness of breath, PND or orthopnea. There have been no reported palpitations, presyncope or syncope.  He was a little walk 600 steps while hiking recently without symptoms.  Allergies  Allergen Reactions  . Oxycodone-Acetaminophen     REACTION: RASH - tolerated morphine  Note : Taking oxycodone/APAP 5-325 postoperatively 12/14 without issue  . Darvocet [Propoxyphene N-Acetaminophen]     Current Outpatient Prescriptions  Medication Sig Dispense Refill  . Ascorbic Acid (VITAMIN C) 1000 MG tablet Take 1,000 mg by mouth daily.        Marland Kitchen aspirin 325 MG tablet Take 325 mg by mouth daily.        Marland Kitchen Bioflavonoid Products (GRAPE SEED PO) Take 1 capsule by mouth daily.      . Cholecalciferol (VITAMIN D3) 5000 UNITS CAPS Take by mouth daily.      . clonazePAM (KLONOPIN) 0.5 MG tablet TAKE 1/2 TO 1 TABLET AT BEDTIME AS NEEDED.  30 tablet  2  . clopidogrel (PLAVIX) 75 MG tablet Take 1 tablet (75 mg total) by mouth daily.  30 tablet  11  . co-enzyme Q-10 30 MG capsule Take 30 mg by mouth daily.       Marland Kitchen HYDROcodone-acetaminophen (NORCO/VICODIN) 5-325 MG per tablet Take 1 tablet by mouth every 6 (six) hours as needed for moderate pain.      . metoprolol tartrate (LOPRESSOR) 25 MG tablet Take 0.5 tablets (12.5 mg total) by mouth 2 (two) times daily.  30 tablet  0  . Multiple Vitamin (MULTIVITAMIN) tablet Take 1 tablet by mouth daily.        . niacin (NIASPAN) 1000 MG CR tablet Take 1 tablet (1,000 mg total) by mouth at bedtime.  30 tablet  11  . nitroGLYCERIN (NITROSTAT) 0.4 MG SL tablet Place 1 tablet (0.4 mg  total) under the tongue every 5 (five) minutes as needed.  25 tablet  5  . Omega-3 Fatty Acids (FISH OIL) 1000 MG CAPS 1 tab po qd       . pantoprazole (PROTONIX) 40 MG tablet Take 1 tablet (40 mg total) by mouth daily.  90 tablet  1  . ramipril (ALTACE) 10 MG capsule Take 1 capsule (10 mg total) by mouth daily.  90 capsule  3  . ranitidine (ZANTAC) 300 MG tablet Take 1 tablet (300 mg total) by mouth at bedtime.  90 tablet  3  . rosuvastatin (CRESTOR) 40 MG tablet Take 1 tablet (40 mg total) by mouth daily.  30 tablet  10  . Saw Palmetto, Serenoa repens, (SAW PALMETTO PO) Take 1 capsule by mouth daily.      . SELENIUM PO Take 1 tablet by mouth daily.      . tadalafil (CIALIS) 20 MG tablet Take 20 mg by mouth daily.      . valACYclovir (VALTREX) 500 MG tablet Take 500 mg by mouth as needed. For cold sore      . vitamin E 100 UNIT capsule Take 100 Units by mouth daily.        Marland Kitchen zoster vaccine live,  PF, (ZOSTAVAX) 54650 UNT/0.65ML injection Inject 19,400 Units into the skin once.  1 each  0   No current facility-administered medications for this visit.    Past Medical History  Diagnosis Date  . CAD (coronary artery disease)     a.S/P CABG 12/00. b. cath 3/12: L-LAD ok, S-RI 95% tx with BMS/thrombectomy, S-AM-RCA ok, EF 60%.  Marland Kitchen HTN (hypertension)   . GERD (gastroesophageal reflux disease)   . Hyperlipidemia   . Hyperglycemia   . Vitamin D deficiency   . Colonic polyp     X2  . Peyronie disease   . Low testosterone     Dr [name], [place] ,[place]  . Sinus bradycardia   . RBBB   . Diverticulitis 2008/2009  . Acute cholecystitis 04/18/2013  . RLS (restless legs syndrome) 08/15/2013    Past Surgical History  Procedure Laterality Date  . Colonoscopy w/ polypectomy  2004 & 2009     X 2; Dr Fuller Plan; due 2014  . Coronary artery bypass graft  04/1999    4 vessels  . Vasectomy    . Coronary stent placement  2012    Plavix  . Coronary angioplasty    . Laparoscopic cholecystectomy  04/21/2013      Dr Margot Chimes; acutecholecystitis with necrosis  . Cholecystectomy N/A 04/21/2013    Procedure: LAPAROSCOPIC CHOLECYSTECTOMY WITH INTRAOPERATIVE CHOLANGIOGRAM;  Surgeon: Haywood Lasso, MD;  Location: [place];  Service: General;  Laterality: N/A;    ROS:  As stated in the HPI and negative for all other systems.  PHYSICAL EXAM BP 134/76  Pulse 58  Ht 5\' 8"  (1.727 m)  Wt 198 lb (89.812 kg)  BMI 30.11 kg/m2 GENERAL:  Well appearing NECK:  No jugular venous distention, waveform within normal limits, carotid upstroke brisk and symmetric, no bruits, no thyromegaly LUNGS:  Clear to auscultation bilaterally BACK:  No CVA tenderness CHEST:  Well healed sternotomy scar. HEART:  PMI not displaced or sustained,S1 and S2 within normal limits, no S3, no S4, no clicks, no rubs, no murmurs ABD:  Flat, positive bowel sounds normal in frequency in pitch, no bruits, no rebound, no guarding, no midline pulsatile mass, no hepatomegaly, no splenomegaly EXT:  2 plus pulses throughout, no edema, no cyanosis no clubbing  EKG:  Sinus rhythm, rate 58, right bundle branch block, axis within normal limits, intervals within normal limits, no acute ST-T wave changes, PACs.  01/16/2014  ASSESSMENT AND PLAN  Coronary atherosclerosis of artery bypass graft -  The patient has no new sypmtoms. No further cardiovascular testing is indicated. We will continue with aggressive risk reduction and meds as listed.  I did review the catheterization from 2012. 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  I did review the cath report again.    HYPERLIPIDEMIA -  I reviewed his lipids and his LDL was 80 HDL 48.9 earlier this year. He will continue with meds as listed.  HYPERTENSION -  The blood pressure is at target. No change in medications is indicated. We will continue with therapeutic lifestyle changes (TLC).  Overweight -  He watches his  diet.  His weight has been stable.

## 2014-01-16 NOTE — Patient Instructions (Signed)
Your physician recommends that you schedule a follow-up appointment in: one year with Dr. Hochrein  

## 2014-01-18 ENCOUNTER — Telehealth: Payer: Self-pay

## 2014-01-18 ENCOUNTER — Ambulatory Visit: Payer: Medicare Other | Admitting: Cardiology

## 2014-01-18 MED ORDER — CLONAZEPAM 0.5 MG PO TABS: ORAL_TABLET | ORAL | Status: DC

## 2014-01-18 NOTE — Telephone Encounter (Signed)
Pt has CPE scheduled for 03/19/2014 at 10:45 am.

## 2014-01-18 NOTE — Telephone Encounter (Signed)
Faxed medication to Pharmacy.

## 2014-01-18 NOTE — Telephone Encounter (Signed)
Advise patient: He is due for a physical by November 2015, please schedule an appointment Prescription printed

## 2014-01-18 NOTE — Telephone Encounter (Signed)
Pt is requesting refill for Clonazepam.  Last OV: 11/21/2013 Last Fill: 07/10/2013 # 30 with 2 RF Last UDS: None   Please Advise.

## 2014-02-07 ENCOUNTER — Other Ambulatory Visit: Payer: Self-pay

## 2014-02-07 DIAGNOSIS — E785 Hyperlipidemia, unspecified: Secondary | ICD-10-CM

## 2014-02-07 MED ORDER — ROSUVASTATIN CALCIUM 40 MG PO TABS: 40.0000 mg | ORAL_TABLET | Freq: Every day | ORAL | Status: DC

## 2014-02-07 MED ORDER — NIACIN ER (ANTIHYPERLIPIDEMIC) 1000 MG PO TBCR: 1000.0000 mg | EXTENDED_RELEASE_TABLET | Freq: Every day | ORAL | Status: DC

## 2014-02-07 MED ORDER — CLOPIDOGREL BISULFATE 75 MG PO TABS: 75.0000 mg | ORAL_TABLET | Freq: Every day | ORAL | Status: DC

## 2014-02-16 DIAGNOSIS — N400 Enlarged prostate without lower urinary tract symptoms: Secondary | ICD-10-CM | POA: Diagnosis not present

## 2014-02-16 DIAGNOSIS — N5201 Erectile dysfunction due to arterial insufficiency: Secondary | ICD-10-CM | POA: Diagnosis not present

## 2014-02-16 DIAGNOSIS — E291 Testicular hypofunction: Secondary | ICD-10-CM | POA: Diagnosis not present

## 2014-02-16 DIAGNOSIS — N486 Induration penis plastica: Secondary | ICD-10-CM | POA: Diagnosis not present

## 2014-02-26 DIAGNOSIS — N486 Induration penis plastica: Secondary | ICD-10-CM | POA: Diagnosis not present

## 2014-02-26 DIAGNOSIS — N5201 Erectile dysfunction due to arterial insufficiency: Secondary | ICD-10-CM | POA: Diagnosis not present

## 2014-02-26 DIAGNOSIS — E291 Testicular hypofunction: Secondary | ICD-10-CM | POA: Diagnosis not present

## 2014-03-01 ENCOUNTER — Other Ambulatory Visit: Payer: Self-pay

## 2014-03-01 MED ORDER — NITROGLYCERIN 0.4 MG SL SUBL: 0.4000 mg | SUBLINGUAL_TABLET | SUBLINGUAL | Status: DC | PRN

## 2014-03-01 MED ORDER — METOPROLOL TARTRATE 25 MG PO TABS: 12.5000 mg | ORAL_TABLET | Freq: Two times a day (BID) | ORAL | Status: DC

## 2014-03-12 ENCOUNTER — Other Ambulatory Visit: Payer: Self-pay

## 2014-03-12 ENCOUNTER — Telehealth: Payer: Self-pay

## 2014-03-12 MED ORDER — PANTOPRAZOLE SODIUM 40 MG PO TBEC: 40.0000 mg | DELAYED_RELEASE_TABLET | Freq: Every day | ORAL | Status: DC

## 2014-03-12 NOTE — Telephone Encounter (Signed)
Spoke with Kevin Webb informed her medication sent to [facility].

## 2014-03-12 NOTE — Telephone Encounter (Addendum)
Kevin Webb Spouse [phone] [place], [place]   Kevin Webb called and she stated that [place] needs his antoprazole (PROTONIX) 40 MG tablet, she stated that the pharmacy said that they had faxed request in 3 different times and we had not responded, I gave her our new fax number. She was wondering if we would call in today, because he is completley out.

## 2014-03-19 ENCOUNTER — Encounter: Payer: Medicare Other | Admitting: Internal Medicine

## 2014-03-19 ENCOUNTER — Telehealth: Payer: Self-pay | Admitting: *Deleted

## 2014-03-19 DIAGNOSIS — Z0289 Encounter for other administrative examinations: Secondary | ICD-10-CM

## 2014-03-19 NOTE — Telephone Encounter (Signed)
Pt did not show for appointment 03/19/2014 at 10:45am for CPE

## 2014-04-09 DIAGNOSIS — R05 Cough: Secondary | ICD-10-CM | POA: Diagnosis not present

## 2014-04-09 DIAGNOSIS — J019 Acute sinusitis, unspecified: Secondary | ICD-10-CM | POA: Diagnosis not present

## 2014-04-09 DIAGNOSIS — J029 Acute pharyngitis, unspecified: Secondary | ICD-10-CM | POA: Diagnosis not present

## 2014-04-18 ENCOUNTER — Encounter: Payer: Medicare Other | Admitting: Internal Medicine

## 2014-04-25 ENCOUNTER — Encounter: Payer: Self-pay | Admitting: Internal Medicine

## 2014-04-25 ENCOUNTER — Ambulatory Visit (INDEPENDENT_AMBULATORY_CARE_PROVIDER_SITE_OTHER): Payer: Medicare Other

## 2014-04-25 ENCOUNTER — Ambulatory Visit (INDEPENDENT_AMBULATORY_CARE_PROVIDER_SITE_OTHER): Payer: Medicare Other | Admitting: Internal Medicine

## 2014-04-25 VITALS — BP 173/94 | HR 67 | Temp 97.6°F | Ht 68.0 in | Wt 209.1 lb

## 2014-04-25 DIAGNOSIS — I25810 Atherosclerosis of coronary artery bypass graft(s) without angina pectoris: Secondary | ICD-10-CM | POA: Diagnosis not present

## 2014-04-25 DIAGNOSIS — Z23 Encounter for immunization: Secondary | ICD-10-CM | POA: Diagnosis not present

## 2014-04-25 DIAGNOSIS — E785 Hyperlipidemia, unspecified: Secondary | ICD-10-CM

## 2014-04-25 DIAGNOSIS — Z0000 Encounter for general adult medical examination without abnormal findings: Secondary | ICD-10-CM

## 2014-04-25 DIAGNOSIS — I10 Essential (primary) hypertension: Secondary | ICD-10-CM | POA: Diagnosis not present

## 2014-04-25 DIAGNOSIS — G2581 Restless legs syndrome: Secondary | ICD-10-CM | POA: Diagnosis not present

## 2014-04-25 DIAGNOSIS — R7303 Prediabetes: Secondary | ICD-10-CM

## 2014-04-25 DIAGNOSIS — R7309 Other abnormal glucose: Secondary | ICD-10-CM | POA: Diagnosis not present

## 2014-04-25 DIAGNOSIS — Z79899 Other long term (current) drug therapy: Secondary | ICD-10-CM | POA: Diagnosis not present

## 2014-04-25 DIAGNOSIS — K219 Gastro-esophageal reflux disease without esophagitis: Secondary | ICD-10-CM

## 2014-04-25 LAB — BASIC METABOLIC PANEL
BUN: 12 mg/dL (ref 6–23)
CALCIUM: 8.9 mg/dL (ref 8.4–10.5)
CHLORIDE: 101 meq/L (ref 96–112)
CO2: 26 mEq/L (ref 19–32)
Creatinine, Ser: 1 mg/dL (ref 0.4–1.5)
GFR: 77.58 mL/min (ref >60.00)
Glucose, Bld: 113 mg/dL — ABNORMAL HIGH (ref 70–99)
Potassium: 4 mEq/L (ref 3.5–5.1)
Sodium: 134 mEq/L — ABNORMAL LOW (ref 135–145)

## 2014-04-25 LAB — HEMOGLOBIN A1C: HEMOGLOBIN A1C: 6.1 % (ref 4.6–6.5)

## 2014-04-25 MED ORDER — CLOPIDOGREL BISULFATE 75 MG PO TABS: 75.0000 mg | ORAL_TABLET | Freq: Every day | ORAL | Status: DC

## 2014-04-25 MED ORDER — TADALAFIL 20 MG PO TABS: 20.0000 mg | ORAL_TABLET | Freq: Every day | ORAL | Status: DC

## 2014-04-25 MED ORDER — RAMIPRIL 10 MG PO CAPS: 10.0000 mg | ORAL_CAPSULE | Freq: Every day | ORAL | Status: DC

## 2014-04-25 MED ORDER — ROSUVASTATIN CALCIUM 40 MG PO TABS: 40.0000 mg | ORAL_TABLET | Freq: Every day | ORAL | Status: DC

## 2014-04-25 MED ORDER — CLONAZEPAM 0.5 MG PO TABS: ORAL_TABLET | ORAL | Status: DC

## 2014-04-25 MED ORDER — METOPROLOL TARTRATE 25 MG PO TABS: 12.5000 mg | ORAL_TABLET | Freq: Two times a day (BID) | ORAL | Status: DC

## 2014-04-25 MED ORDER — PANTOPRAZOLE SODIUM 40 MG PO TBEC: 40.0000 mg | DELAYED_RELEASE_TABLET | Freq: Every day | ORAL | Status: DC

## 2014-04-25 MED ORDER — NIACIN ER (ANTIHYPERLIPIDEMIC) 1000 MG PO TBCR: 1000.0000 mg | EXTENDED_RELEASE_TABLET | Freq: Every day | ORAL | Status: DC

## 2014-04-25 NOTE — Assessment & Plan Note (Signed)
RLS Continue with clonazepam, UDS today

## 2014-04-25 NOTE — Patient Instructions (Signed)
Get your blood work before you leave  You also need a UDS    Please come back to the office in 6 months  for a routine check up  Come back fasting          Preventive Care for Adult Ages 59 and over  Blood pressure check.** / Every 1 to 2 years.  Lipid and cholesterol check.**/ Every 5 years beginning at age 66.  Lung cancer screening. / Every year if you are aged 3-80 years and have a 30-pack-year history of smoking and currently smoke or have quit within the past 15 years. Yearly screening is stopped once you have quit smoking for at least 15 years or develop a health problem that would prevent you from having lung cancer treatment.  Fecal occult blood test (FOBT) of stool. / Every year beginning at age 90 and continuing until age 37. You may not have to do this test if you get a colonoscopy every 10 years.  Flexible sigmoidoscopy** or colonoscopy.** / Every 5 years for a flexible sigmoidoscopy or every 10 years for a colonoscopy beginning at age 56 and continuing until age 53.  Hepatitis C blood test.** / For all people born from [birth year] through 1965 and any individual with known risks for hepatitis C.  Abdominal aortic aneurysm (AAA) screening.** / A one-time screening for ages 110 to 66 years who are current or former smokers.  Skin self-exam. / Monthly.  Influenza vaccine. / Every year.  Tetanus, diphtheria, and acellular pertussis (Tdap/Td) vaccine.** / 1 dose of Td every 10 years.  Varicella vaccine.** / Consult your health care provider.  Zoster vaccine.** / 1 dose for adults aged 110 years or older.  Pneumococcal 13-valent conjugate (PCV13) vaccine.** / Consult your health care provider.  Pneumococcal polysaccharide (PPSV23) vaccine.** / 1 dose for all adults aged 68 years and older.  Meningococcal vaccine.** / Consult your health care provider.  Hepatitis A vaccine.** / Consult your health care provider.  Hepatitis B vaccine.** / Consult your health care  provider.  Haemophilus influenzae type b (Hib) vaccine.** / Consult your health care provider. **Family history and personal history of risk and conditions may change your health care provider's recommendations. Document Released: 06/30/2001 Document Revised: 05/09/2013 Document Reviewed: 09/29/2010 [facility] Patient Information 2015 [place], [place]. This information is not intended to replace advice given to you by your health care provider. Make sure you discuss any questions you have with your health care provider.    Fall Prevention and Home Safety Falls cause injuries and can affect all age groups. It is possible to use preventive measures to significantly decrease the likelihood of falls. There are many simple measures which can make your home safer and prevent falls. OUTDOORS  Repair cracks and edges of walkways and driveways.  Remove high doorway thresholds.  Trim shrubbery on the main path into your home.  Have good outside lighting.  Clear walkways of tools, rocks, debris, and clutter.  Check that handrails are not broken and are securely fastened. Both sides of steps should have handrails.  Have leaves, snow, and ice cleared regularly.  Use sand or salt on walkways during winter months.  In the garage, clean up grease or oil spills. BATHROOM  Install night lights.  Install grab bars by the toilet and in the tub and shower.  Use non-skid mats or decals in the tub or shower.  Place a plastic non-slip stool in the shower to sit on, if needed.  Keep floors dry  and clean up all water on the floor immediately.  Remove soap buildup in the tub or shower on a regular basis.  Secure bath mats with non-slip, double-sided rug tape.  Remove throw rugs and tripping hazards from the floors. BEDROOMS  Install night lights.  Make sure a bedside light is easy to reach.  Do not use oversized bedding.  Keep a telephone by your bedside.  Have a firm chair with side arms  to use for getting dressed.  Remove throw rugs and tripping hazards from the floor. KITCHEN  Keep handles on pots and pans turned toward the center of the stove. Use back burners when possible.  Clean up spills quickly and allow time for drying.  Avoid walking on wet floors.  Avoid hot utensils and knives.  Position shelves so they are not too high or low.  Place commonly used objects within easy reach.  If necessary, use a sturdy step stool with a grab bar when reaching.  Keep electrical cables out of the way.  Do not use floor polish or wax that makes floors slippery. If you must use wax, use non-skid floor wax.  Remove throw rugs and tripping hazards from the floor. STAIRWAYS  Never leave objects on stairs.  Place handrails on both sides of stairways and use them. Fix any loose handrails. Make sure handrails on both sides of the stairways are as long as the stairs.  Check carpeting to make sure it is firmly attached along stairs. Make repairs to worn or loose carpet promptly.  Avoid placing throw rugs at the top or bottom of stairways, or properly secure the rug with carpet tape to prevent slippage. Get rid of throw rugs, if possible.  Have an electrician put in a light switch at the top and bottom of the stairs. OTHER FALL PREVENTION TIPS  Wear low-heel or rubber-soled shoes that are supportive and fit well. Wear closed toe shoes.  When using a stepladder, make sure it is fully opened and both spreaders are firmly locked. Do not climb a closed stepladder.  Add color or contrast paint or tape to grab bars and handrails in your home. Place contrasting color strips on first and last steps.  Learn and use mobility aids as needed. Install an electrical emergency response system.  Turn on lights to avoid dark areas. Replace light bulbs that burn out immediately. Get light switches that glow.  Arrange furniture to create clear pathways. Keep furniture in the same  place.  Firmly attach carpet with non-skid or double-sided tape.  Eliminate uneven floor surfaces.  Select a carpet pattern that does not visually hide the edge of steps.  Be aware of all pets. OTHER HOME SAFETY TIPS  Set the water temperature for 120 F (48.8 C).  Keep emergency numbers on or near the telephone.  Keep smoke detectors on every level of the home and near sleeping areas. Document Released: 04/24/2002 Document Revised: 11/03/2011 Document Reviewed: 07/24/2011 [facility] Patient Information 2015 [place], [place]. This information is not intended to replace advice given to you by your health care provider. Make sure you discuss any questions you have with your health care provider.

## 2014-04-25 NOTE — Assessment & Plan Note (Signed)
  Td 2010 zostavax--03-2014 PNM shot-- today Flu shot-- today cscope 12-2009, benign polyps, 5 years  PSAs per urology Diet-exercise discussed

## 2014-04-25 NOTE — Assessment & Plan Note (Signed)
  GERD Self discontinue H2 blockers, well-controlled with PPIs only

## 2014-04-25 NOTE — Progress Notes (Signed)
Pre visit review using our clinic review tool, if applicable. No additional management support is needed unless otherwise documented below in the visit note. 

## 2014-04-25 NOTE — Progress Notes (Signed)
Subjective:    Patient ID: Kevin Webb, male    DOB: 11/24/1945, 68 y.o.   MRN: [id number]  DOS:  04/25/2014 Type of visit - description : physical Here for Medicare AWV:  1. Risk factors based on Past M, S, F history: reviewed 2. Physical Activities: active at work, Development worker, community, has a farm 3. Depression/mood: neg screening  4. Hearing: has some problems , got tested 5 years ago, rec to be retested, no problems noted during normal conversation 5. ADL's:  independent  6. Fall Risk: had a fall , horse accident, june 2015 7. home Safety: does feel safe at home  8. Height, weight, & visual acuity: see VS, vision ok, rec to see eye doctor regularly  9. Counseling: provided 10. Labs ordered based on risk factors: if needed  11. Referral Coordination: if needed 12. Care Plan, see assessment and plan  13. Cognitive Assessment: motor and cognition above normal for age  36. Care team updated 15. Personalized plan provided,  see instructions  In addition, today we discussed the following: CAD, saw cardiology 01-2014, felt to be stable High blood pressure, good compliance w/ medication, ambulatory BPs 120, 130/80 most of the time, very rarely may go up in the 150s or 160s. GERD, symptoms well controlled on PPIs only RLS, on clonazepam.symptoms well-controlled Recently saw urology, on HRT, they did a PSA (per patient)  ROS Denies chest pain or difficulty breathing No nausea, vomiting, diarrhea or blood in the stools. No dysuria, gross hematuria or difficulty urinating   Past Medical History  Diagnosis Date  . CAD (coronary artery disease)     a.S/P CABG 12/00. b. cath 3/12: L-LAD ok, S-RI 95% tx with BMS/thrombectomy, S-AM-RCA ok, EF 60%.  Marland Kitchen HTN (hypertension)   . GERD (gastroesophageal reflux disease)   . Hyperlipidemia   . Hyperglycemia   . Vitamin D deficiency   . Colonic polyp     X2  . Peyronie disease   . Low testosterone     Dr [name], [place] ,[place]  . Sinus bradycardia     . RBBB   . Diverticulitis 2008/2009  . Acute cholecystitis 04/18/2013  . RLS (restless legs syndrome) 08/15/2013    Past Surgical History  Procedure Laterality Date  . Colonoscopy w/ polypectomy  2004 & 2009     X 2; Dr Fuller Plan; due 2014  . Coronary artery bypass graft  04/1999    4 vessels  . Vasectomy    . Coronary stent placement  2012    Plavix  . Coronary angioplasty    . Laparoscopic cholecystectomy  04/21/2013    Dr Margot Chimes; acutecholecystitis with necrosis  . Cholecystectomy N/A 04/21/2013    Procedure: LAPAROSCOPIC CHOLECYSTECTOMY WITH INTRAOPERATIVE CHOLANGIOGRAM;  Surgeon: Haywood Lasso, MD;  Location: [facility];  Service: General;  Laterality: N/A;    History   Social History  . Marital Status: Single    Spouse Name: N/A    Number of Children: 1  . Years of Education: N/A   Occupational History  . plumber      plumber   Social History Main Topics  . Smoking status: Never Smoker   . Smokeless tobacco: Never Used  . Alcohol Use: 0.0 oz/week    0 Not specified per week     Comment: socially   . Drug Use: No  . Sexual Activity: Not on file   Other Topics Concern  . Not on file   Social History Narrative   Remarried 2014  Family History  Problem Relation Age of Onset  . Aortic aneurysm Father 1    ? abdominal  . Diabetes      PGaunt  . Coronary artery disease Mother     CABG in 17s  . Diabetes Brother   . Cancer Neg Hx   . CVA Mother     cns bleed from warfarin  . Heart attack Father 70  . Cholecystitis Sister   . Colon cancer Neg Hx   . Prostate cancer Neg Hx        Medication List       This list is accurate as of: 04/25/14  5:53 PM.  Always use your most recent med list.               aspirin 325 MG tablet  Take 325 mg by mouth daily.     clonazePAM 0.5 MG tablet  Commonly known as:  KLONOPIN  TAKE 1/2 TO 1 TABLET AT BEDTIME AS NEEDED.     clopidogrel 75 MG tablet  Commonly known as:  PLAVIX  Take 1 tablet (75 mg total)  by mouth daily.     co-enzyme Q-10 30 MG capsule  Take 30 mg by mouth daily.     Fish Oil 1000 MG Caps  1 tab po qd     GRAPE SEED PO  Take 1 capsule by mouth daily.     HYDROcodone-acetaminophen 5-325 MG per tablet  Commonly known as:  NORCO/VICODIN  Take 1 tablet by mouth every 6 (six) hours as needed for moderate pain.     metoprolol tartrate 25 MG tablet  Commonly known as:  LOPRESSOR  Take 0.5 tablets (12.5 mg total) by mouth 2 (two) times daily.     multivitamin tablet  Take 1 tablet by mouth daily.     niacin 1000 MG CR tablet  Commonly known as:  NIASPAN  Take 1 tablet (1,000 mg total) by mouth at bedtime.     nitroGLYCERIN 0.4 MG SL tablet  Commonly known as:  NITROSTAT  Place 1 tablet (0.4 mg total) under the tongue every 5 (five) minutes as needed.     pantoprazole 40 MG tablet  Commonly known as:  PROTONIX  Take 1 tablet (40 mg total) by mouth daily.     ramipril 10 MG capsule  Commonly known as:  ALTACE  Take 1 capsule (10 mg total) by mouth daily.     rosuvastatin 40 MG tablet  Commonly known as:  CRESTOR  Take 1 tablet (40 mg total) by mouth daily.     SAW PALMETTO PO  Take 1 capsule by mouth daily.     SELENIUM PO  Take 1 tablet by mouth daily.     tadalafil 20 MG tablet  Commonly known as:  CIALIS  Take 1 tablet (20 mg total) by mouth daily.     valACYclovir 500 MG tablet  Commonly known as:  VALTREX  Take 500 mg by mouth as needed. For cold sore     vitamin C 1000 MG tablet  Take 1,000 mg by mouth daily.     Vitamin D3 5000 UNITS Caps  Take by mouth daily.     vitamin E 100 UNIT capsule  Take 100 Units by mouth daily.     zoster vaccine live (PF) 19400 UNT/0.65ML injection  Commonly known as:  ZOSTAVAX  Inject 19,400 Units into the skin once.           Objective:   Physical  Exam BP 173/94 mmHg  Pulse 67  Temp(Src) 97.6 F (36.4 C) (Oral)  Ht 5\' 8"  (1.727 m)  Wt 209 lb 2 oz (94.858 kg)  BMI 31.80 kg/m2  SpO2  97% General -- alert, well-developed, NAD.  Neck --no thyromegaly , normal carotid pulse HEENT-- Not pale.  Lungs -- normal respiratory effort, no intercostal retractions, no accessory muscle use, and normal breath sounds.  Heart-- normal rate, regular rhythm, no murmur.  Abdomen-- Not distended, good bowel sounds,soft, non-tender. No bruit  Extremities-- no pretibial edema bilaterally  Neurologic--  alert & oriented X3. Speech normal, gait appropriate for age, strength symmetric and appropriate for age.  Psych-- Cognition and judgment appear intact. Cooperative with normal attention span and concentration. No anxious or depressed appearing.      Assessment & Plan:   CAD stable on current regimen, note from cardiology reviewed. Hypertension continue with low dose of metoprolol, ramipril, monitoring BPs. See instructions Prediabetes Check A1c, on diet control only Hyperlipidemia Well-controlled per last FLP Erectile  dysfunction On Cialis, very aware of interactions with nitroglycerin  Return to the office 6 months fasting we'll check a cholesterol panel at that time

## 2014-05-08 ENCOUNTER — Telehealth: Payer: Self-pay

## 2014-05-08 NOTE — Telephone Encounter (Signed)
UDS: 04/25/2014  Positive for Clonazepam   Low risk per Dr. Larose Kells 05/08/2014

## 2014-05-14 DIAGNOSIS — Z79899 Other long term (current) drug therapy: Secondary | ICD-10-CM | POA: Diagnosis not present

## 2014-05-14 DIAGNOSIS — N401 Enlarged prostate with lower urinary tract symptoms: Secondary | ICD-10-CM | POA: Diagnosis not present

## 2014-05-14 DIAGNOSIS — E291 Testicular hypofunction: Secondary | ICD-10-CM | POA: Diagnosis not present

## 2014-05-14 DIAGNOSIS — N138 Other obstructive and reflux uropathy: Secondary | ICD-10-CM | POA: Diagnosis not present

## 2014-05-15 ENCOUNTER — Encounter: Payer: Self-pay | Admitting: Internal Medicine

## 2014-05-17 DIAGNOSIS — J018 Other acute sinusitis: Secondary | ICD-10-CM | POA: Diagnosis not present

## 2014-05-18 DIAGNOSIS — M7542 Impingement syndrome of left shoulder: Secondary | ICD-10-CM

## 2014-05-18 HISTORY — DX: Impingement syndrome of left shoulder: M75.42

## 2014-06-13 ENCOUNTER — Encounter (HOSPITAL_COMMUNITY): Payer: Self-pay | Admitting: Emergency Medicine

## 2014-06-13 ENCOUNTER — Emergency Department (HOSPITAL_COMMUNITY): Payer: Medicare Other

## 2014-06-13 DIAGNOSIS — I2510 Atherosclerotic heart disease of native coronary artery without angina pectoris: Secondary | ICD-10-CM | POA: Diagnosis not present

## 2014-06-13 DIAGNOSIS — R079 Chest pain, unspecified: Secondary | ICD-10-CM | POA: Diagnosis not present

## 2014-06-13 DIAGNOSIS — E785 Hyperlipidemia, unspecified: Secondary | ICD-10-CM | POA: Insufficient documentation

## 2014-06-13 DIAGNOSIS — Z87438 Personal history of other diseases of male genital organs: Secondary | ICD-10-CM | POA: Diagnosis not present

## 2014-06-13 DIAGNOSIS — Z7902 Long term (current) use of antithrombotics/antiplatelets: Secondary | ICD-10-CM | POA: Insufficient documentation

## 2014-06-13 DIAGNOSIS — R0789 Other chest pain: Secondary | ICD-10-CM | POA: Insufficient documentation

## 2014-06-13 DIAGNOSIS — I10 Essential (primary) hypertension: Secondary | ICD-10-CM | POA: Insufficient documentation

## 2014-06-13 DIAGNOSIS — Z9861 Coronary angioplasty status: Secondary | ICD-10-CM | POA: Insufficient documentation

## 2014-06-13 DIAGNOSIS — Z79899 Other long term (current) drug therapy: Secondary | ICD-10-CM | POA: Diagnosis not present

## 2014-06-13 DIAGNOSIS — E559 Vitamin D deficiency, unspecified: Secondary | ICD-10-CM | POA: Diagnosis not present

## 2014-06-13 DIAGNOSIS — R05 Cough: Secondary | ICD-10-CM | POA: Diagnosis not present

## 2014-06-13 DIAGNOSIS — Z86010 Personal history of colonic polyps: Secondary | ICD-10-CM | POA: Insufficient documentation

## 2014-06-13 DIAGNOSIS — Z8669 Personal history of other diseases of the nervous system and sense organs: Secondary | ICD-10-CM | POA: Insufficient documentation

## 2014-06-13 DIAGNOSIS — Z951 Presence of aortocoronary bypass graft: Secondary | ICD-10-CM | POA: Insufficient documentation

## 2014-06-13 DIAGNOSIS — K219 Gastro-esophageal reflux disease without esophagitis: Secondary | ICD-10-CM | POA: Insufficient documentation

## 2014-06-13 DIAGNOSIS — Z7982 Long term (current) use of aspirin: Secondary | ICD-10-CM | POA: Diagnosis not present

## 2014-06-13 LAB — CBC WITH DIFFERENTIAL/PLATELET
Basophils Absolute: 0 10*3/uL (ref 0.0–0.1)
Basophils Relative: 0 % (ref 0–1)
Eosinophils Absolute: 0.1 10*3/uL (ref 0.0–0.7)
Eosinophils Relative: 1 % (ref 0–5)
HCT: 44.3 % (ref 39.0–52.0)
Hemoglobin: 15.5 g/dL (ref 13.0–17.0)
LYMPHS PCT: 27 % (ref 12–46)
Lymphs Abs: 1.7 10*3/uL (ref 0.7–4.0)
MCH: 31.7 pg (ref 26.0–34.0)
MCHC: 35 g/dL (ref 30.0–36.0)
MCV: 90.6 fL (ref 78.0–100.0)
MONO ABS: 0.5 10*3/uL (ref 0.1–1.0)
MONOS PCT: 8 % (ref 3–12)
NEUTROS PCT: 64 % (ref 43–77)
Neutro Abs: 4 10*3/uL (ref 1.7–7.7)
PLATELETS: 167 10*3/uL (ref 150–400)
RBC: 4.89 MIL/uL (ref 4.22–5.81)
RDW: 12.8 % (ref 11.5–15.5)
WBC: 6.3 10*3/uL (ref 4.0–10.5)

## 2014-06-13 LAB — COMPREHENSIVE METABOLIC PANEL
ALT: 37 U/L (ref 0–53)
AST: 38 U/L — AB (ref 0–37)
Albumin: 3.8 g/dL (ref 3.5–5.2)
Alkaline Phosphatase: 69 U/L (ref 39–117)
Anion gap: 7 (ref 5–15)
BUN: 7 mg/dL (ref 6–23)
CO2: 25 mmol/L (ref 19–32)
Calcium: 9.1 mg/dL (ref 8.4–10.5)
Chloride: 103 mmol/L (ref 96–112)
Creatinine, Ser: 0.95 mg/dL (ref 0.50–1.35)
GFR, EST NON AFRICAN AMERICAN: 85 mL/min — AB (ref >90)
Glucose, Bld: 157 mg/dL — ABNORMAL HIGH (ref 70–99)
Potassium: 3.8 mmol/L (ref 3.5–5.1)
Sodium: 135 mmol/L (ref 135–145)
Total Bilirubin: 0.7 mg/dL (ref 0.3–1.2)
Total Protein: 6.7 g/dL (ref 6.0–8.3)

## 2014-06-13 LAB — I-STAT TROPONIN, ED: Troponin i, poc: 0 ng/mL (ref 0.00–0.08)

## 2014-06-13 NOTE — ED Notes (Signed)
Pt presents with left sided chest pain ongoing throughout the day, denies SOB or nausea.  Pt admits to having a productive cough for the past week, admits that pain is worse when he coughs.  Pt speaking in full sentences at present, respirations e/u.

## 2014-06-14 ENCOUNTER — Emergency Department (HOSPITAL_COMMUNITY)
Admission: EM | Admit: 2014-06-14 | Discharge: 2014-06-14 | Disposition: A | Discharge status: Home / Self Care | Payer: Medicare Other | Attending: Emergency Medicine | Admitting: Emergency Medicine

## 2014-06-14 DIAGNOSIS — R0789 Other chest pain: Secondary | ICD-10-CM

## 2014-06-14 LAB — I-STAT TROPONIN, ED: Troponin i, poc: 0 ng/mL (ref 0.00–0.08)

## 2014-06-14 NOTE — Discharge Instructions (Signed)
We saw you in the ER for the chest pain/shortness of breath. All of our cardiac workup is normal, including labs, EKG and chest X-RAY are normal. We are not sure what is causing your discomfort, but we feel comfortable sending you home at this time. The workup in the ER is not complete, and you should follow up with your primary care doctor for further evaluation.   Chest Pain (Nonspecific) It is often hard to give a specific diagnosis for the cause of chest pain. There is always a chance that your pain could be related to something serious, such as a heart attack or a blood clot in the lungs. You need to follow up with your health care provider for further evaluation. CAUSES   Heartburn.  Pneumonia or bronchitis.  Anxiety or stress.  Inflammation around your heart (pericarditis) or lung (pleuritis or pleurisy).  A blood clot in the lung.  A collapsed lung (pneumothorax). It can develop suddenly on its own (spontaneous pneumothorax) or from trauma to the chest.  Shingles infection (herpes zoster virus). The chest wall is composed of bones, muscles, and cartilage. Any of these can be the source of the pain.  The bones can be bruised by injury.  The muscles or cartilage can be strained by coughing or overwork.  The cartilage can be affected by inflammation and become sore (costochondritis). DIAGNOSIS  Lab tests or other studies may be needed to find the cause of your pain. Your health care provider may have you take a test called an ambulatory electrocardiogram (ECG). An ECG records your heartbeat patterns over a 24-hour period. You may also have other tests, such as:  Transthoracic echocardiogram (TTE). During echocardiography, sound waves are used to evaluate how blood flows through your heart.  Transesophageal echocardiogram (TEE).  Cardiac monitoring. This allows your health care provider to monitor your heart rate and rhythm in real time.  Holter monitor. This is a portable  device that records your heartbeat and can help diagnose heart arrhythmias. It allows your health care provider to track your heart activity for several days, if needed.  Stress tests by exercise or by giving medicine that makes the heart beat faster. TREATMENT   Treatment depends on what may be causing your chest pain. Treatment may include:  Acid blockers for heartburn.  Anti-inflammatory medicine.  Pain medicine for inflammatory conditions.  Antibiotics if an infection is present.  You may be advised to change lifestyle habits. This includes stopping smoking and avoiding alcohol, caffeine, and chocolate.  You may be advised to keep your head raised (elevated) when sleeping. This reduces the chance of acid going backward from your stomach into your esophagus. Most of the time, nonspecific chest pain will improve within 2-3 days with rest and mild pain medicine.  HOME CARE INSTRUCTIONS   If antibiotics were prescribed, take them as directed. Finish them even if you start to feel better.  For the next few days, avoid physical activities that bring on chest pain. Continue physical activities as directed.  Do not use any tobacco products, including cigarettes, chewing tobacco, or electronic cigarettes.  Avoid drinking alcohol.  Only take medicine as directed by your health care provider.  Follow your health care provider's suggestions for further testing if your chest pain does not go away.  Keep any follow-up appointments you made. If you do not go to an appointment, you could develop lasting (chronic) problems with pain. If there is any problem keeping an appointment, call to reschedule. SEEK  MEDICAL CARE IF:   Your chest pain does not go away, even after treatment.  You have a rash with blisters on your chest.  You have a fever. SEEK IMMEDIATE MEDICAL CARE IF:   You have increased chest pain or pain that spreads to your arm, neck, jaw, back, or abdomen.  You have  shortness of breath.  You have an increasing cough, or you cough up blood.  You have severe back or abdominal pain.  You feel nauseous or vomit.  You have severe weakness.  You faint.  You have chills. This is an emergency. Do not wait to see if the pain will go away. Get medical help at once. Call your local emergency services (911 in [place] Do not drive yourself to the hospital. MAKE SURE YOU:   Understand these instructions.  Will watch your condition.  Will get help right away if you are not doing well or get worse. Document Released: 02/11/2005 Document Revised: 05/09/2013 Document Reviewed: 12/08/2007 [facility] Patient Information 2015 [place], [place]. This information is not intended to replace advice given to you by your health care provider. Make sure you discuss any questions you have with your health care provider.

## 2014-06-14 NOTE — ED Provider Notes (Signed)
CSN: [id number]     Arrival date & time 06/13/14  2201 History  This chart was scribed for Varney Biles, MD by Chester Holstein, ED Scribe. This patient was seen in room A11C/A11C and the patient's care was started at 2:29 AM.    Chief Complaint  Patient presents with  . Cough  . Chest Pain    Patient is a 69 y.o. male presenting with cough and chest pain. The history is provided by the patient. No language interpreter was used.  Cough Associated symptoms: chest pain   Associated symptoms: no shortness of breath   Chest Pain Associated symptoms: cough   Associated symptoms: no dizziness, no nausea and no shortness of breath    HPI Comments: Kevin Webb is a 69 y.o. male with PMHX of CAD, HTN, GERD, HLD, hyperglycemia, vitamin D deficiency, diverticulitis, acute cholecystitis, sinus bradycardia, RBBB, and RLS who presents to the Emergency Department complaining of left sided chest pain with onset around 10 PM. Pt notes he had a coughing spell at onset. Pt describes pain as a soreness. Pt notes associated congestion in morning but otherwise notes dry cough. Pt notes pain is not reproducible with cough. Pt has used cough drops for relief.   He states pain has resolved . Pt had a CABG in 2000 and sent placement in 2012. He states pain is not similar to the PMHx. Pt is not a smoker. Pt denies nausea, dizziness, and SOB.  127/72 Past Medical History  Diagnosis Date  . CAD (coronary artery disease)     a.S/P CABG 12/00. b. cath 3/12: L-LAD ok, S-RI 95% tx with BMS/thrombectomy, S-AM-RCA ok, EF 60%.  Marland Kitchen HTN (hypertension)   . GERD (gastroesophageal reflux disease)   . Hyperlipidemia   . Hyperglycemia   . Vitamin D deficiency   . Colonic polyp     X2  . Peyronie disease   . Low testosterone     Dr [name], [place] ,[place]  . Sinus bradycardia   . RBBB   . Diverticulitis 2008/2009  . Acute cholecystitis 04/18/2013  . RLS (restless legs syndrome) 08/15/2013   Past Surgical History   Procedure Laterality Date  . Colonoscopy w/ polypectomy  2004 & 2009     X 2; Dr Fuller Plan; due 2014  . Coronary artery bypass graft  04/1999    4 vessels  . Vasectomy    . Coronary stent placement  2012    Plavix  . Coronary angioplasty    . Laparoscopic cholecystectomy  04/21/2013    Dr Margot Chimes; acutecholecystitis with necrosis  . Cholecystectomy N/A 04/21/2013    Procedure: LAPAROSCOPIC CHOLECYSTECTOMY WITH INTRAOPERATIVE CHOLANGIOGRAM;  Surgeon: Haywood Lasso, MD;  Location: [facility] OR;  Service: General;  Laterality: N/A;   Family History  Problem Relation Age of Onset  . Aortic aneurysm Father 58    ? abdominal  . Diabetes      PGaunt  . Coronary artery disease Mother     CABG in 90s  . Diabetes Brother   . Cancer Neg Hx   . CVA Mother     cns bleed from warfarin  . Heart attack Father 10  . Cholecystitis Sister   . Colon cancer Neg Hx   . Prostate cancer Neg Hx    History  Substance Use Topics  . Smoking status: Never Smoker   . Smokeless tobacco: Never Used  . Alcohol Use: 0.0 oz/week    0 Not specified per week     Comment:  socially     Review of Systems  Respiratory: Positive for cough. Negative for shortness of breath.   Cardiovascular: Positive for chest pain.  Gastrointestinal: Negative for nausea.  Neurological: Negative for dizziness.      Allergies  Oxycodone-acetaminophen and Darvocet  Home Medications   Prior to Admission medications   Medication Sig Start Date End Date Taking? Authorizing Provider  Ascorbic Acid (VITAMIN C) 1000 MG tablet Take 1,000 mg by mouth daily.      Historical Provider, MD  aspirin 325 MG tablet Take 325 mg by mouth daily.      Historical Provider, MD  Bioflavonoid Products (GRAPE SEED PO) Take 1 capsule by mouth daily.    Historical Provider, MD  Cholecalciferol (VITAMIN D3) 5000 UNITS CAPS Take by mouth daily.    Historical Provider, MD  clonazePAM (KLONOPIN) 0.5 MG tablet TAKE 1/2 TO 1 TABLET AT BEDTIME AS NEEDED.  04/25/14   Colon Branch, MD  clopidogrel (PLAVIX) 75 MG tablet Take 1 tablet (75 mg total) by mouth daily. 04/25/14   Colon Branch, MD  co-enzyme Q-10 30 MG capsule Take 30 mg by mouth daily.     Historical Provider, MD  HYDROcodone-acetaminophen (NORCO/VICODIN) 5-325 MG per tablet Take 1 tablet by mouth every 6 (six) hours as needed for moderate pain.    Historical Provider, MD  metoprolol tartrate (LOPRESSOR) 25 MG tablet Take 0.5 tablets (12.5 mg total) by mouth 2 (two) times daily. 04/25/14   Colon Branch, MD  Multiple Vitamin (MULTIVITAMIN) tablet Take 1 tablet by mouth daily.      Historical Provider, MD  niacin (NIASPAN) 1000 MG CR tablet Take 1 tablet (1,000 mg total) by mouth at bedtime. 04/25/14   Colon Branch, MD  nitroGLYCERIN (NITROSTAT) 0.4 MG SL tablet Place 1 tablet (0.4 mg total) under the tongue every 5 (five) minutes as needed. Patient not taking: Reported on 04/25/2014 03/01/14   Colon Branch, MD  Omega-3 Fatty Acids (FISH OIL) 1000 MG CAPS 1 tab po qd     Historical Provider, MD  pantoprazole (PROTONIX) 40 MG tablet Take 1 tablet (40 mg total) by mouth daily. 04/25/14   Colon Branch, MD  ramipril (ALTACE) 10 MG capsule Take 1 capsule (10 mg total) by mouth daily. 04/25/14   Colon Branch, MD  rosuvastatin (CRESTOR) 40 MG tablet Take 1 tablet (40 mg total) by mouth daily. 04/25/14   Colon Branch, MD  Saw Palmetto, Serenoa repens, (SAW PALMETTO PO) Take 1 capsule by mouth daily.    Historical Provider, MD  SELENIUM PO Take 1 tablet by mouth daily.    Historical Provider, MD  tadalafil (CIALIS) 20 MG tablet Take 1 tablet (20 mg total) by mouth daily. 04/25/14   Colon Branch, MD  valACYclovir (VALTREX) 500 MG tablet Take 500 mg by mouth as needed. For cold sore 04/02/11   Hendricks Limes, MD  vitamin E 100 UNIT capsule Take 100 Units by mouth daily.      Historical Provider, MD  zoster vaccine live, PF, (ZOSTAVAX) 17408 UNT/0.65ML injection Inject 19,400 Units into the skin once. Patient not taking:  Reported on 04/25/2014 12/15/13   Colon Branch, MD   BP 160/101 mmHg  Pulse 62  Temp(Src) 97.5 F (36.4 C) (Oral)  Resp 19  Ht 5\' 8"  (1.727 m)  Wt 199 lb (90.266 kg)  BMI 30.26 kg/m2  SpO2 95% Physical Exam  Constitutional: He is oriented to person, place, and  time. He appears well-developed and well-nourished. No distress.  HENT:  Head: Normocephalic and atraumatic.  Eyes: Conjunctivae are normal. Right eye exhibits no discharge. Left eye exhibits no discharge.  Neck: Neck supple. No JVD present.  Cardiovascular: Normal rate, regular rhythm and normal heart sounds.  Exam reveals no gallop and no friction rub.   No murmur heard. Pulmonary/Chest: Effort normal and breath sounds normal. No respiratory distress.  Abdominal: Soft. He exhibits no distension. There is no tenderness.  Musculoskeletal: He exhibits no edema or tenderness.  No pitting edema; no calf tenderness  Neurological: He is alert and oriented to person, place, and time.  Skin: Skin is warm and dry.  Psychiatric: He has a normal mood and affect. His behavior is normal. Thought content normal.  Nursing note and vitals reviewed.   ED Course  Procedures (including critical care time) DIAGNOSTIC STUDIES: Oxygen Saturation is 97% on room air, normal by my interpretation.    COORDINATION OF CARE: 2:37 AM Discussed treatment plan with patient at beside, the patient agrees with the plan and has no further questions at this time.   Labs Review Labs Reviewed  COMPREHENSIVE METABOLIC PANEL - Abnormal; Notable for the following:    Glucose, Bld 157 (*)    AST 38 (*)    GFR calc non Af Amer 85 (*)    All other components within normal limits  CBC WITH DIFFERENTIAL/PLATELET  Randolm Idol, ED  Randolm Idol, ED    Imaging Review   EKG Interpretation   Date/Time:  Wednesday June 13 2014 22:10:05 EST Ventricular Rate:  80 PR Interval:  134 QRS Duration: 120 QT Interval:  400 QTC Calculation: 461 R Axis:    -42 Text Interpretation:  Normal sinus rhythm with sinus arrhythmia Left axis  deviation Right bundle branch block Abnormal ECG No significant change  since last tracing Confirmed by Kathrynn Humble, MD, Thelma Comp [phone]) on 06/14/2014  2:41:13 AM     Results for orders placed or performed during the hospital encounter of 06/14/14  CBC with Differential  Result Value Ref Range   WBC 6.3 4.0 - 10.5 K/uL   RBC 4.89 4.22 - 5.81 MIL/uL   Hemoglobin 15.5 13.0 - 17.0 g/dL   HCT 44.3 39.0 - 52.0 %   MCV 90.6 78.0 - 100.0 fL   MCH 31.7 26.0 - 34.0 pg   MCHC 35.0 30.0 - 36.0 g/dL   RDW 12.8 11.5 - 15.5 %   Platelets 167 150 - 400 K/uL   Neutrophils Relative % 64 43 - 77 %   Neutro Abs 4.0 1.7 - 7.7 K/uL   Lymphocytes Relative 27 12 - 46 %   Lymphs Abs 1.7 0.7 - 4.0 K/uL   Monocytes Relative 8 3 - 12 %   Monocytes Absolute 0.5 0.1 - 1.0 K/uL   Eosinophils Relative 1 0 - 5 %   Eosinophils Absolute 0.1 0.0 - 0.7 K/uL   Basophils Relative 0 0 - 1 %   Basophils Absolute 0.0 0.0 - 0.1 K/uL  Comprehensive metabolic panel  Result Value Ref Range   Sodium 135 135 - 145 mmol/L   Potassium 3.8 3.5 - 5.1 mmol/L   Chloride 103 96 - 112 mmol/L   CO2 25 19 - 32 mmol/L   Glucose, Bld 157 (H) 70 - 99 mg/dL   BUN 7 6 - 23 mg/dL   Creatinine, Ser 0.95 0.50 - 1.35 mg/dL   Calcium 9.1 8.4 - 10.5 mg/dL   Total Protein 6.7 6.0 -  8.3 g/dL   Albumin 3.8 3.5 - 5.2 g/dL   AST 38 (H) 0 - 37 U/L   ALT 37 0 - 53 U/L   Alkaline Phosphatase 69 39 - 117 U/L   Total Bilirubin 0.7 0.3 - 1.2 mg/dL   GFR calc non Af Amer 85 (L) >90 mL/min   GFR calc Af Amer >90 >90 mL/min   Anion gap 7 5 - 15  I-Stat Troponin, ED (not at [facility])  Result Value Ref Range   Troponin i, poc 0.00 0.00 - 0.08 ng/mL   Comment 3          I-stat troponin, ED  Result Value Ref Range   Troponin i, poc 0.00 0.00 - 0.08 ng/mL   Comment 3           Dg Chest 2 View  06/13/2014   CLINICAL DATA:  With productive cough for 7 days. Mid chest pain  beginning today. History of heart disease and hypertension.  EXAM: CHEST  2 VIEW  COMPARISON:  04/22/2013  FINDINGS: Postoperative changes in the mediastinum. Normal heart size and pulmonary vascularity. No focal airspace disease or consolidation in the lungs. No blunting of costophrenic angles. No pneumothorax. Mediastinal contours appear intact. Tortuous and calcified aorta. Mild degenerative changes in the spine. Surgical clips in the right upper quadrant.  IMPRESSION: No active cardiopulmonary disease.   Electronically Signed   By: Lucienne Capers M.D.   On: 06/13/2014 22:44     MDM   Final diagnoses:  Musculoskeletal chest pain  Atypical chest pain    I personally performed the services described in this documentation, which was scribed in my presence. The recorded information has been reviewed and is accurate.  Pt comes in with cc of chest pain. Chest pain atypical, and per pt different that his previous ACS chest pain. He just wanted to cautious with his pain and thus came to the ER.  Currently chest pain free, initial trop and ekg are normal.  Pain does appear quite atypical, and is reproducible as well to some extent, so we have decided to get serial trops and if results are normal, pt will be discharged with Cards f/u.    Varney Biles, MD 06/16/14 [phone]

## 2014-08-29 DIAGNOSIS — D1800 Hemangioma unspecified site: Secondary | ICD-10-CM | POA: Diagnosis not present

## 2014-08-29 DIAGNOSIS — L578 Other skin changes due to chronic exposure to nonionizing radiation: Secondary | ICD-10-CM | POA: Diagnosis not present

## 2014-08-29 DIAGNOSIS — D229 Melanocytic nevi, unspecified: Secondary | ICD-10-CM | POA: Diagnosis not present

## 2014-08-29 DIAGNOSIS — L718 Other rosacea: Secondary | ICD-10-CM | POA: Diagnosis not present

## 2014-08-29 DIAGNOSIS — Z1283 Encounter for screening for malignant neoplasm of skin: Secondary | ICD-10-CM | POA: Diagnosis not present

## 2014-08-29 DIAGNOSIS — L821 Other seborrheic keratosis: Secondary | ICD-10-CM | POA: Diagnosis not present

## 2014-08-29 DIAGNOSIS — L814 Other melanin hyperpigmentation: Secondary | ICD-10-CM | POA: Diagnosis not present

## 2014-09-19 ENCOUNTER — Other Ambulatory Visit: Payer: Self-pay

## 2014-09-19 DIAGNOSIS — Z125 Encounter for screening for malignant neoplasm of prostate: Secondary | ICD-10-CM | POA: Diagnosis not present

## 2014-09-19 DIAGNOSIS — E291 Testicular hypofunction: Secondary | ICD-10-CM | POA: Diagnosis not present

## 2014-10-25 ENCOUNTER — Ambulatory Visit (INDEPENDENT_AMBULATORY_CARE_PROVIDER_SITE_OTHER): Payer: Medicare Other | Admitting: Internal Medicine

## 2014-10-25 ENCOUNTER — Encounter: Payer: Self-pay | Admitting: Internal Medicine

## 2014-10-25 VITALS — BP 132/84 | HR 59 | Temp 97.6°F | Ht 68.0 in | Wt 209.1 lb

## 2014-10-25 DIAGNOSIS — E785 Hyperlipidemia, unspecified: Secondary | ICD-10-CM | POA: Diagnosis not present

## 2014-10-25 DIAGNOSIS — I10 Essential (primary) hypertension: Secondary | ICD-10-CM

## 2014-10-25 LAB — LIPID PANEL
Cholesterol: 164 mg/dL (ref 0–200)
HDL: 46 mg/dL (ref >39.00)
LDL Cholesterol: 87 mg/dL (ref 0–99)
NONHDL: 118
TRIGLYCERIDES: 154 mg/dL — AB (ref 0.0–149.0)
Total CHOL/HDL Ratio: 4
VLDL: 30.8 mg/dL (ref 0.0–40.0)

## 2014-10-25 LAB — BASIC METABOLIC PANEL
BUN: 12 mg/dL (ref 6–23)
CHLORIDE: 103 meq/L (ref 96–112)
CO2: 27 mEq/L (ref 19–32)
Calcium: 9.2 mg/dL (ref 8.4–10.5)
Creatinine, Ser: 0.86 mg/dL (ref 0.40–1.50)
GFR: 94.31 mL/min (ref >60.00)
GLUCOSE: 117 mg/dL — AB (ref 70–99)
Potassium: 3.9 mEq/L (ref 3.5–5.1)
Sodium: 136 mEq/L (ref 135–145)

## 2014-10-25 LAB — AST: AST: 35 U/L (ref 0–37)

## 2014-10-25 LAB — ALT: ALT: 32 U/L (ref 0–53)

## 2014-10-25 MED ORDER — METOPROLOL TARTRATE 25 MG PO TABS: 25.0000 mg | ORAL_TABLET | Freq: Two times a day (BID) | ORAL | Status: DC

## 2014-10-25 MED ORDER — CLOPIDOGREL BISULFATE 75 MG PO TABS: 75.0000 mg | ORAL_TABLET | Freq: Every day | ORAL | Status: DC

## 2014-10-25 MED ORDER — ROSUVASTATIN CALCIUM 40 MG PO TABS: 40.0000 mg | ORAL_TABLET | Freq: Every day | ORAL | Status: DC

## 2014-10-25 MED ORDER — PANTOPRAZOLE SODIUM 40 MG PO TBEC: 40.0000 mg | DELAYED_RELEASE_TABLET | Freq: Every day | ORAL | Status: DC

## 2014-10-25 MED ORDER — RAMIPRIL 10 MG PO CAPS: 10.0000 mg | ORAL_CAPSULE | Freq: Every day | ORAL | Status: DC

## 2014-10-25 MED ORDER — NIACIN ER (ANTIHYPERLIPIDEMIC) 1000 MG PO TBCR: 1000.0000 mg | EXTENDED_RELEASE_TABLET | Freq: Every day | ORAL | Status: DC

## 2014-10-25 NOTE — Assessment & Plan Note (Signed)
Continue Crestor and Niaspan, check a FLP, AST, ALT

## 2014-10-25 NOTE — Progress Notes (Signed)
Subjective:    Patient ID: Kevin Webb, male    DOB: [birth year], 69 y.o.   MRN: [id number]  DOS:  10/25/2014 Type of visit - description : rov Interval history: Hyperlipidemia, good compliance w/  medications. Due for labs Hypertension, on beta blockers and Altace, BPs in the morning ~ 155/101 often, also his heart rate used to be in the 50s 60s and now he noticed that heart rate often times in the 80s. ED, urology change Cialis to Viagra.    Review of Systems He remains active doing heavy yard work without dyspnea , chest pain or difficulty breathing. He did notice his stamina is not the same as few years ago but again no specific symptoms. No nausea, vomiting, diarrhea  Past Medical History  Diagnosis Date  . CAD (coronary artery disease)     a.S/P CABG 12/00. b. cath 3/12: L-LAD ok, S-RI 95% tx with BMS/thrombectomy, S-AM-RCA ok, EF 60%.  Marland Kitchen HTN (hypertension)   . GERD (gastroesophageal reflux disease)   . Hyperlipidemia   . Hyperglycemia   . Vitamin D deficiency   . Colonic polyp     X2  . Peyronie disease   . Low testosterone     Dr [name], [place] ,[place]  . Sinus bradycardia   . RBBB   . Diverticulitis 2008/2009  . Acute cholecystitis 04/18/2013  . RLS (restless legs syndrome) 08/15/2013    Past Surgical History  Procedure Laterality Date  . Colonoscopy w/ polypectomy  2004 & 2009     X 2; Dr Fuller Plan; due 2014  . Coronary artery bypass graft  04/1999    4 vessels  . Vasectomy    . Coronary stent placement  2012    Plavix  . Coronary angioplasty    . Laparoscopic cholecystectomy  04/21/2013    Dr Margot Chimes; acutecholecystitis with necrosis  . Cholecystectomy N/A 04/21/2013    Procedure: LAPAROSCOPIC CHOLECYSTECTOMY WITH INTRAOPERATIVE CHOLANGIOGRAM;  Surgeon: Haywood Lasso, MD;  Location: [facility];  Service: General;  Laterality: N/A;    History   Social History  . Marital Status: Single    Spouse Name: N/A  . Number of Children: 1  . Years of Education: N/A    Occupational History  . plumber      plumber   Social History Main Topics  . Smoking status: Never Smoker   . Smokeless tobacco: Never Used  . Alcohol Use: 0.0 oz/week    0 Standard drinks or equivalent per week     Comment: socially   . Drug Use: No  . Sexual Activity: Not on file   Other Topics Concern  . Not on file   Social History Narrative   Remarried 2014        Medication List       This list is accurate as of: 10/25/14  3:30 PM.  Always use your most recent med list.               aspirin 325 MG tablet  Take 325 mg by mouth daily.     clonazePAM 0.5 MG tablet  Commonly known as:  KLONOPIN  TAKE 1/2 TO 1 TABLET AT BEDTIME AS NEEDED.     clopidogrel 75 MG tablet  Commonly known as:  PLAVIX  Take 1 tablet (75 mg total) by mouth daily.     co-enzyme Q-10 30 MG capsule  Take 30 mg by mouth daily.     Fish Oil 1000 MG Caps  1 tab  po qd     GRAPE SEED PO  Take 1 capsule by mouth daily.     HYDROcodone-acetaminophen 5-325 MG per tablet  Commonly known as:  NORCO/VICODIN  Take 1 tablet by mouth every 6 (six) hours as needed for moderate pain.     metoprolol tartrate 25 MG tablet  Commonly known as:  LOPRESSOR  Take 1 tablet (25 mg total) by mouth 2 (two) times daily.     multivitamin tablet  Take 1 tablet by mouth daily.     niacin 1000 MG CR tablet  Commonly known as:  NIASPAN  Take 1 tablet (1,000 mg total) by mouth at bedtime.     nitroGLYCERIN 0.4 MG SL tablet  Commonly known as:  NITROSTAT  Place 1 tablet (0.4 mg total) under the tongue every 5 (five) minutes as needed.     pantoprazole 40 MG tablet  Commonly known as:  PROTONIX  Take 1 tablet (40 mg total) by mouth daily.     ramipril 10 MG capsule  Commonly known as:  ALTACE  Take 1 capsule (10 mg total) by mouth daily.     rosuvastatin 40 MG tablet  Commonly known as:  CRESTOR  Take 1 tablet (40 mg total) by mouth daily.     SAW PALMETTO PO  Take 1 capsule by mouth daily.      SELENIUM PO  Take 1 tablet by mouth daily.     sildenafil 25 MG tablet  Commonly known as:  VIAGRA  Take 50 mg by mouth daily as needed for erectile dysfunction (per urology).     valACYclovir 500 MG tablet  Commonly known as:  VALTREX  Take 500 mg by mouth as needed. For cold sore     vitamin C 1000 MG tablet  Take 1,000 mg by mouth daily.     Vitamin D3 5000 UNITS Caps  Take by mouth daily.     vitamin E 100 UNIT capsule  Take 100 Units by mouth daily.           Objective:   Physical Exam BP 132/84 mmHg  Pulse 59  Temp(Src) 97.6 F (36.4 C) (Oral)  Ht 5\' 8"  (1.727 m)  Wt 209 lb 2 oz (94.858 kg)  BMI 31.80 kg/m2  SpO2 96%  General:   Well developed, well nourished . NAD.  HEENT:  Normocephalic . Face symmetric, atraumatic Lungs:  CTA B Normal respiratory effort, no intercostal retractions, no accessory muscle use. Heart: RRR,  no murmur.  no pretibial edema bilaterally   Skin: Not pale. Not jaundice Neurologic:  alert & oriented X3.  Speech normal, gait appropriate for age and unassisted Psych--  Cognition and judgment appear intact.  Cooperative with normal attention span and concentration.  Behavior appropriate. No anxious or depressed appearing.      Assessment & Plan:

## 2014-10-25 NOTE — Progress Notes (Signed)
Pre visit review using our clinic review tool, if applicable. No additional management support is needed unless otherwise documented below in the visit note. 

## 2014-10-25 NOTE — Patient Instructions (Addendum)
Get your blood work before you leave   Increase metoprolol to 1 tablet twice a day  Check the  blood pressure and pulse daily  Be sure your blood pressure is between 110/65 and  145/85. Also, your pulse should not be less than 50 Please call us in 2 weeks and let us know how your readings are Call anytime if your readings are out of range consistently

## 2014-10-25 NOTE — Assessment & Plan Note (Signed)
BP and heart rate is slightly elevated lately, see history of present illness. Plan: Continue with Altace, increase metoprolol from 12.5 twice a day to 25 twice a day. Recommend close self monitor BP and heart rate, see instructions. Follow-up in 3 months

## 2014-10-29 DIAGNOSIS — M9902 Segmental and somatic dysfunction of thoracic region: Secondary | ICD-10-CM | POA: Diagnosis not present

## 2014-10-29 DIAGNOSIS — M9901 Segmental and somatic dysfunction of cervical region: Secondary | ICD-10-CM | POA: Diagnosis not present

## 2014-10-29 DIAGNOSIS — M9905 Segmental and somatic dysfunction of pelvic region: Secondary | ICD-10-CM | POA: Diagnosis not present

## 2014-10-29 DIAGNOSIS — M791 Myalgia: Secondary | ICD-10-CM | POA: Diagnosis not present

## 2014-10-29 DIAGNOSIS — Z72820 Sleep deprivation: Secondary | ICD-10-CM | POA: Diagnosis not present

## 2014-10-29 DIAGNOSIS — M5430 Sciatica, unspecified side: Secondary | ICD-10-CM | POA: Diagnosis not present

## 2014-10-29 DIAGNOSIS — R51 Headache: Secondary | ICD-10-CM | POA: Diagnosis not present

## 2014-10-29 DIAGNOSIS — M9903 Segmental and somatic dysfunction of lumbar region: Secondary | ICD-10-CM | POA: Diagnosis not present

## 2014-10-31 DIAGNOSIS — R51 Headache: Secondary | ICD-10-CM | POA: Diagnosis not present

## 2014-10-31 DIAGNOSIS — M5430 Sciatica, unspecified side: Secondary | ICD-10-CM | POA: Diagnosis not present

## 2014-10-31 DIAGNOSIS — M9903 Segmental and somatic dysfunction of lumbar region: Secondary | ICD-10-CM | POA: Diagnosis not present

## 2014-10-31 DIAGNOSIS — M9902 Segmental and somatic dysfunction of thoracic region: Secondary | ICD-10-CM | POA: Diagnosis not present

## 2014-10-31 DIAGNOSIS — M791 Myalgia: Secondary | ICD-10-CM | POA: Diagnosis not present

## 2014-10-31 DIAGNOSIS — M9905 Segmental and somatic dysfunction of pelvic region: Secondary | ICD-10-CM | POA: Diagnosis not present

## 2014-10-31 DIAGNOSIS — M9901 Segmental and somatic dysfunction of cervical region: Secondary | ICD-10-CM | POA: Diagnosis not present

## 2014-10-31 DIAGNOSIS — Z72820 Sleep deprivation: Secondary | ICD-10-CM | POA: Diagnosis not present

## 2014-11-12 ENCOUNTER — Encounter: Payer: Self-pay | Admitting: Gastroenterology

## 2014-12-05 DIAGNOSIS — M1711 Unilateral primary osteoarthritis, right knee: Secondary | ICD-10-CM | POA: Diagnosis not present

## 2015-01-11 DIAGNOSIS — S61202A Unspecified open wound of right middle finger without damage to nail, initial encounter: Secondary | ICD-10-CM | POA: Diagnosis not present

## 2015-01-11 DIAGNOSIS — R030 Elevated blood-pressure reading, without diagnosis of hypertension: Secondary | ICD-10-CM | POA: Diagnosis not present

## 2015-01-16 ENCOUNTER — Ambulatory Visit: Payer: BLUE CROSS/BLUE SHIELD | Admitting: Cardiology

## 2015-01-25 ENCOUNTER — Ambulatory Visit (INDEPENDENT_AMBULATORY_CARE_PROVIDER_SITE_OTHER): Payer: Medicare Other | Admitting: Internal Medicine

## 2015-01-25 ENCOUNTER — Encounter: Payer: Self-pay | Admitting: Internal Medicine

## 2015-01-25 ENCOUNTER — Encounter: Payer: Self-pay | Admitting: Cardiology

## 2015-01-25 ENCOUNTER — Ambulatory Visit (INDEPENDENT_AMBULATORY_CARE_PROVIDER_SITE_OTHER): Payer: Medicare Other | Admitting: Cardiology

## 2015-01-25 VITALS — BP 160/96 | HR 66 | Ht 69.0 in | Wt 207.0 lb

## 2015-01-25 VITALS — BP 142/90 | HR 65 | Temp 98.1°F | Ht 68.0 in | Wt 208.2 lb

## 2015-01-25 DIAGNOSIS — I10 Essential (primary) hypertension: Secondary | ICD-10-CM

## 2015-01-25 DIAGNOSIS — Z23 Encounter for immunization: Secondary | ICD-10-CM | POA: Diagnosis not present

## 2015-01-25 DIAGNOSIS — I25810 Atherosclerosis of coronary artery bypass graft(s) without angina pectoris: Secondary | ICD-10-CM | POA: Diagnosis not present

## 2015-01-25 DIAGNOSIS — R7309 Other abnormal glucose: Secondary | ICD-10-CM | POA: Diagnosis not present

## 2015-01-25 DIAGNOSIS — E785 Hyperlipidemia, unspecified: Secondary | ICD-10-CM | POA: Diagnosis not present

## 2015-01-25 DIAGNOSIS — Z09 Encounter for follow-up examination after completed treatment for conditions other than malignant neoplasm: Secondary | ICD-10-CM

## 2015-01-25 DIAGNOSIS — R7303 Prediabetes: Secondary | ICD-10-CM

## 2015-01-25 LAB — LIPID PANEL
CHOL/HDL RATIO: 3
Cholesterol: 194 mg/dL (ref 0–200)
HDL: 60.5 mg/dL (ref >39.00)
LDL Cholesterol: 104 mg/dL — ABNORMAL HIGH (ref 0–99)
NONHDL: 133.95
Triglycerides: 150 mg/dL — ABNORMAL HIGH (ref 0.0–149.0)
VLDL: 30 mg/dL (ref 0.0–40.0)

## 2015-01-25 LAB — HEMOGLOBIN A1C: Hgb A1c MFr Bld: 6.3 % (ref 4.6–6.5)

## 2015-01-25 MED ORDER — AMLODIPINE BESYLATE 5 MG PO TABS: 5.0000 mg | ORAL_TABLET | Freq: Every day | ORAL | Status: DC

## 2015-01-25 NOTE — Progress Notes (Signed)
Pre visit review using our clinic review tool, if applicable. No additional management support is needed unless otherwise documented below in the visit note. 

## 2015-01-25 NOTE — Progress Notes (Signed)
HPI The patient presents for evaluation of CAD.  Since I last saw him he has done well.  He did have chest pain in January and I reviewed these records. This was ultimately not felt to be angina. Since that time this summer he was out [place] doing a lot of walking and hiking. The patient denies any new symptoms such as chest discomfort, neck or arm discomfort. There has been no new shortness of breath, PND or orthopnea. There have been no reported palpitations, presyncope or syncope.    He does report that his blood pressure has been elevated and I reviewed his blood pressure diary. He's had some readings with diastolics in the 491P and systolics in the 915A and 180s.  Allergies  Allergen Reactions  . Oxycodone-Acetaminophen     REACTION: RASH - tolerated morphine  Note : Taking oxycodone/APAP 5-325 postoperatively 12/14 without issue  . Darvocet [Propoxyphene N-Acetaminophen]     Current Outpatient Prescriptions  Medication Sig Dispense Refill  . Ascorbic Acid (VITAMIN C) 1000 MG tablet Take 1,000 mg by mouth daily.      Marland Kitchen aspirin 325 MG tablet Take 325 mg by mouth daily.      Marland Kitchen Bioflavonoid Products (GRAPE SEED PO) Take 1 capsule by mouth daily.    . Cholecalciferol (VITAMIN D3) 5000 UNITS CAPS Take by mouth daily.    . clonazePAM (KLONOPIN) 0.5 MG tablet TAKE 1/2 TO 1 TABLET AT BEDTIME AS NEEDED. 60 tablet 2  . clopidogrel (PLAVIX) 75 MG tablet Take 1 tablet (75 mg total) by mouth daily. 90 tablet 2  . co-enzyme Q-10 30 MG capsule Take 30 mg by mouth daily.     Marland Kitchen HYDROcodone-acetaminophen (NORCO/VICODIN) 5-325 MG per tablet Take 1 tablet by mouth every 6 (six) hours as needed for moderate pain.    . metoprolol tartrate (LOPRESSOR) 25 MG tablet Take 1 tablet (25 mg total) by mouth 2 (two) times daily. 180 tablet 2  . Multiple Vitamin (MULTIVITAMIN) tablet Take 1 tablet by mouth daily.      . niacin (NIASPAN) 1000 MG CR tablet Take 1 tablet (1,000 mg total) by mouth at bedtime. 90  tablet 2  . nitroGLYCERIN (NITROSTAT) 0.4 MG SL tablet Place 1 tablet (0.4 mg total) under the tongue every 5 (five) minutes as needed. 25 tablet 5  . Omega-3 Fatty Acids (FISH OIL) 1000 MG CAPS 1 tab po qd     . pantoprazole (PROTONIX) 40 MG tablet Take 1 tablet (40 mg total) by mouth daily. 90 tablet 2  . ramipril (ALTACE) 10 MG capsule Take 1 capsule (10 mg total) by mouth daily. 90 capsule 2  . rosuvastatin (CRESTOR) 40 MG tablet Take 1 tablet (40 mg total) by mouth daily. 90 tablet 2  . Saw Palmetto, Serenoa repens, (SAW PALMETTO PO) Take 1 capsule by mouth daily.    . SELENIUM PO Take 1 tablet by mouth daily.    . sildenafil (VIAGRA) 25 MG tablet Take 50 mg by mouth daily as needed for erectile dysfunction (per urology).    . valACYclovir (VALTREX) 500 MG tablet Take 500 mg by mouth as needed. For cold sore    . vitamin E 100 UNIT capsule Take 100 Units by mouth daily.       No current facility-administered medications for this visit.    Past Medical History  Diagnosis Date  . CAD (coronary artery disease)     a.S/P CABG 12/00. b. cath 3/12: L-LAD ok, S-RI 95%  tx with BMS/thrombectomy, S-AM-RCA ok, EF 60%.  Marland Kitchen HTN (hypertension)   . GERD (gastroesophageal reflux disease)   . Hyperlipidemia   . Hyperglycemia   . Vitamin D deficiency   . Colonic polyp     X2  . Peyronie disease   . Low testosterone     Dr [name], [employer] ,[place]  . Sinus bradycardia   . RBBB   . Diverticulitis 2008/2009  . Acute cholecystitis 04/18/2013  . RLS (restless legs syndrome) 08/15/2013  . Osteoarthritis of right knee     With trace effusion    Past Surgical History  Procedure Laterality Date  . Colonoscopy w/ polypectomy  2004 & 2009     X 2; Dr Fuller Plan; due 2014  . Coronary artery bypass graft  04/1999    4 vessels  . Vasectomy    . Coronary stent placement  2012    Plavix  . Coronary angioplasty    . Laparoscopic cholecystectomy  04/21/2013    Dr Margot Chimes; acutecholecystitis with necrosis  .  Cholecystectomy N/A 04/21/2013    Procedure: LAPAROSCOPIC CHOLECYSTECTOMY WITH INTRAOPERATIVE CHOLANGIOGRAM;  Surgeon: Haywood Lasso, MD;  Location: [facility];  Service: General;  Laterality: N/A;    ROS:  As stated in the HPI and negative for all other systems.  PHYSICAL EXAM BP 160/96 mmHg  Pulse 66  Ht 5\' 9"  (1.753 m)  Wt 207 lb (93.895 kg)  BMI 30.55 kg/m2 GENERAL:  Well appearing NECK:  No jugular venous distention, waveform within normal limits, carotid upstroke brisk and symmetric, no bruits, no thyromegaly LUNGS:  Clear to auscultation bilaterally BACK:  No CVA tenderness CHEST:  Well healed sternotomy scar. HEART:  PMI not displaced or sustained,S1 and S2 within normal limits, no S3, no S4, no clicks, no rubs, no murmurs ABD:  Flat, positive bowel sounds normal in frequency in pitch, no bruits, no rebound, no guarding, no midline pulsatile mass, no hepatomegaly, no splenomegaly EXT:  2 plus pulses throughout, no edema, no cyanosis no clubbing  EKG:  Sinus rhythm, rate 66, right bundle branch block, axis within normal limits, intervals within normal limits, no acute ST-T wave changes, PACs.  01/25/2015  ASSESSMENT AND PLAN  Coronary atherosclerosis of artery bypass graft -  The patient has no new sypmtoms.  No further cardiovascular testing is indicated.  He can stop his Plavix.    HYPERLIPIDEMIA -  I reviewed his lipids and his LDL was 87.  He will continue with meds as listed.  HYPERTENSION -  The blood pressure is elevated.  I will add Norvasc 5mg .  He will keep his blood pressure diary and I'll make further adjustments as needed.

## 2015-01-25 NOTE — Progress Notes (Signed)
Subjective:    Patient ID: Kevin Webb, male    DOB: 27-Oct-1945, 69 y.o.   MRN: [id number]  DOS:  01/25/2015 Type of visit - description : Routine visit Interval history: Hypertension, good compliance of medication, ambulatory BPs have been elevated: 169/79, 150/81, 180/118. He saw cardiology this morning and started amlodipine. Prediabetes: Check blood sugars from time to time, this morning was 150. High cholesterol: Good compliance with medication, last LDL not ideal.   Review of Systems Cut his right middle finger few days ago, was seen elsewhere, area was glued. Denies chest pain or shortness of breath. Recently went to the [place], did a lot of walking without any problems. No nausea, vomiting, diarrhea  Past Medical History  Diagnosis Date  . CAD (coronary artery disease)     a.S/P CABG 12/00. b. cath 3/12: L-LAD ok, S-RI 95% tx with BMS/thrombectomy, S-AM-RCA ok, EF 60%.  Marland Kitchen HTN (hypertension)   . GERD (gastroesophageal reflux disease)   . Hyperlipidemia   . Hyperglycemia   . Vitamin D deficiency   . Colonic polyp     X2  . Peyronie disease   . Low testosterone     Dr [name], [place] ,[place]  . Sinus bradycardia   . RBBB   . Diverticulitis 2008/2009  . Acute cholecystitis 04/18/2013  . RLS (restless legs syndrome) 08/15/2013  . Osteoarthritis of right knee     With trace effusion    Past Surgical History  Procedure Laterality Date  . Colonoscopy w/ polypectomy  2004 & 2009     X 2; Dr Fuller Plan; due 2014  . Coronary artery bypass graft  04/1999    4 vessels  . Vasectomy    . Coronary stent placement  2012    Plavix  . Coronary angioplasty    . Laparoscopic cholecystectomy  04/21/2013    Dr Margot Chimes; acutecholecystitis with necrosis  . Cholecystectomy N/A 04/21/2013    Procedure: LAPAROSCOPIC CHOLECYSTECTOMY WITH INTRAOPERATIVE CHOLANGIOGRAM;  Surgeon: Haywood Lasso, MD;  Location: [facility];  Service: General;  Laterality: N/A;    Social History   Social History    . Marital Status: Single    Spouse Name: N/A  . Number of Children: 1  . Years of Education: N/A   Occupational History  . plumber      plumber   Social History Main Topics  . Smoking status: Never Smoker   . Smokeless tobacco: Never Used  . Alcohol Use: 0.0 oz/week    0 Standard drinks or equivalent per week     Comment: socially   . Drug Use: No  . Sexual Activity: Not on file   Other Topics Concern  . Not on file   Social History Narrative   Remarried 2014        Medication List       This list is accurate as of: 01/25/15 11:59 PM.  Always use your most recent med list.               amLODipine 5 MG tablet  Commonly known as:  NORVASC  Take 1 tablet (5 mg total) by mouth daily.     aspirin 81 MG tablet  Take 81 mg by mouth daily.     co-enzyme Q-10 30 MG capsule  Take 30 mg by mouth daily.     Fish Oil 1000 MG Caps  1 tab po qd     GRAPE SEED PO  Take 1 capsule by mouth daily.  HYDROcodone-acetaminophen 5-325 MG per tablet  Commonly known as:  NORCO/VICODIN  Take 1 tablet by mouth every 6 (six) hours as needed for moderate pain.     metoprolol tartrate 25 MG tablet  Commonly known as:  LOPRESSOR  Take 1 tablet (25 mg total) by mouth 2 (two) times daily.     multivitamin tablet  Take 1 tablet by mouth daily.     niacin 1000 MG CR tablet  Commonly known as:  NIASPAN  Take 1 tablet (1,000 mg total) by mouth at bedtime.     nitroGLYCERIN 0.4 MG SL tablet  Commonly known as:  NITROSTAT  Place 1 tablet (0.4 mg total) under the tongue every 5 (five) minutes as needed.     pantoprazole 40 MG tablet  Commonly known as:  PROTONIX  Take 1 tablet (40 mg total) by mouth daily.     ramipril 10 MG capsule  Commonly known as:  ALTACE  Take 1 capsule (10 mg total) by mouth daily.     rosuvastatin 40 MG tablet  Commonly known as:  CRESTOR  Take 1 tablet (40 mg total) by mouth daily.     SAW PALMETTO PO  Take 1 capsule by mouth daily.      SELENIUM PO  Take 1 tablet by mouth daily.     sildenafil 25 MG tablet  Commonly known as:  VIAGRA  Take 50 mg by mouth daily as needed for erectile dysfunction (per urology).     valACYclovir 500 MG tablet  Commonly known as:  VALTREX  Take 500 mg by mouth as needed. For cold sore     vitamin C 1000 MG tablet  Take 1,000 mg by mouth daily.     Vitamin D3 5000 UNITS Caps  Take by mouth daily.     vitamin E 100 UNIT capsule  Take 100 Units by mouth daily.           Objective:   Physical Exam BP 142/90 mmHg  Pulse 65  Temp(Src) 98.1 F (36.7 C) (Oral)  Ht 5\' 8"  (1.727 m)  Wt 208 lb 4 oz (94.462 kg)  BMI 31.67 kg/m2  SpO2 95% General:   Well developed, well nourished . NAD.  HEENT:  Normocephalic . Face symmetric, atraumatic Lungs:  CTA B Normal respiratory effort, no intercostal retractions, no accessory muscle use. Heart: RRR,  no murmur.  No pretibial edema bilaterally  Skin: Right middle finger with a distal deep cut, good granulation tissue, no evidence of cellulitis Neurologic:  alert & oriented X3.  Speech normal, gait appropriate for age and unassisted Psych--  Cognition and judgment appear intact.  Cooperative with normal attention span and concentration.  Behavior appropriate. No anxious or depressed appearing.      Assessment & Plan:   A/P Hypertension: Ambulatory BPs elevated, this morning was prescribed amlodipine 5 mg daily by cardiology, in addition he will continue with his normal medications.   Prediabetes: Check A1c, recommend to check blood sugars daily, CBG goals discussed RLS: Not taking clonazepam, Tylenol PM usually helps okay. Primary care: High-dose flu shot today, return to the office in 3 months for a physical

## 2015-01-25 NOTE — Patient Instructions (Signed)
Get your blood work before you leave   Diabetes: Check your blood sugar  once a day   at different times of the day GOALS: Fasting before a meal 70- 130 2 hours after a meal less than 180 At bedtime 90-150 Call if consistently not at goal   Next visit  for a   Physical exam by 04-2015   Please schedule an appointment at the front desk Please come back fasting

## 2015-01-25 NOTE — Patient Instructions (Signed)
Your physician wants you to follow-up in: 1 Year. You will receive a reminder letter in the mail two months in advance. If you don't receive a letter, please call our office to schedule the follow-up appointment.  Your physician has recommended you make the following change in your medication: STOP Plavix and START Amlodipine 5 mg daily.

## 2015-01-27 DIAGNOSIS — Z09 Encounter for follow-up examination after completed treatment for conditions other than malignant neoplasm: Secondary | ICD-10-CM | POA: Insufficient documentation

## 2015-01-27 NOTE — Assessment & Plan Note (Signed)
Hypertension: Ambulatory BPs elevated, this morning was prescribed amlodipine 5 mg daily by cardiology, in addition he will continue with his normal medications.   Prediabetes: Check A1c, recommend to check blood sugars daily, CBG goals discussed RLS: Not taking clonazepam, Tylenol PM usually helps okay. Primary care: High-dose flu shot today, return to the office in 3 months for a physical

## 2015-02-22 DIAGNOSIS — M7542 Impingement syndrome of left shoulder: Secondary | ICD-10-CM | POA: Diagnosis not present

## 2015-03-05 ENCOUNTER — Encounter: Payer: Self-pay | Admitting: Gastroenterology

## 2015-04-16 ENCOUNTER — Telehealth: Payer: Self-pay | Admitting: Gastroenterology

## 2015-04-16 NOTE — Telephone Encounter (Signed)
patient states that he is having R abd pain for the past week. He states that he wants to proceed with colonoscopy that is scheduled within the next few weeks but is wanting to know if he needs to be seen by the doctor at an [facility] as well. I offered next available with Dr. Fuller Plan but patient is requesting to be seen sooner.

## 2015-04-16 NOTE — Telephone Encounter (Signed)
Patient is scheduled for a colon recall for mid December.  He reports that he has had some RUQ pain for the last week.  He had a cholecystectomy in 2014.  He wants to see Dr. Fuller Plan prior to his colonoscopy, he does not want to cancel his colonoscopy for 05/02/15.  He is advised that the first opening is in January with Dr. Fuller Plan and late Dec for APP.  He is advised to keep the colonoscopy as scheduled for 05/02/15 and see his primary care for RUQ pain prior to his colonoscopy.  I did schedule a new patient appt with Dr. Fuller Plan for 06/17/15

## 2015-04-18 ENCOUNTER — Ambulatory Visit (AMBULATORY_SURGERY_CENTER): Payer: Self-pay

## 2015-04-18 VITALS — Ht 69.0 in | Wt 215.0 lb

## 2015-04-18 DIAGNOSIS — Z860100 Personal history of colon polyps, unspecified: Secondary | ICD-10-CM

## 2015-04-18 MED ORDER — SUPREP BOWEL PREP KIT 17.5-3.13-1.6 GM/177ML PO SOLN: 1.0000 | Freq: Once | ORAL | Status: DC

## 2015-04-18 NOTE — Progress Notes (Signed)
No allergies to eggs or soy No past problems with anesthesia No diet/weight loss meds No home oxygen  Has email and internet; registered for emmi

## 2015-04-23 ENCOUNTER — Ambulatory Visit (INDEPENDENT_AMBULATORY_CARE_PROVIDER_SITE_OTHER): Payer: Medicare Other | Admitting: Internal Medicine

## 2015-04-23 ENCOUNTER — Encounter: Payer: Self-pay | Admitting: Internal Medicine

## 2015-04-23 VITALS — BP 118/76 | HR 63 | Temp 98.1°F | Ht 68.0 in | Wt 213.5 lb

## 2015-04-23 DIAGNOSIS — I10 Essential (primary) hypertension: Secondary | ICD-10-CM | POA: Diagnosis not present

## 2015-04-23 DIAGNOSIS — I25810 Atherosclerosis of coronary artery bypass graft(s) without angina pectoris: Secondary | ICD-10-CM

## 2015-04-23 DIAGNOSIS — R109 Unspecified abdominal pain: Secondary | ICD-10-CM | POA: Diagnosis not present

## 2015-04-23 DIAGNOSIS — Z860100 Personal history of colon polyps, unspecified: Secondary | ICD-10-CM

## 2015-04-23 DIAGNOSIS — Z09 Encounter for follow-up examination after completed treatment for conditions other than malignant neoplasm: Secondary | ICD-10-CM

## 2015-04-23 DIAGNOSIS — R10A0 Flank pain, unspecified side: Secondary | ICD-10-CM

## 2015-04-23 LAB — POCT URINALYSIS DIPSTICK
Bilirubin, UA: NEGATIVE
Blood, UA: NEGATIVE
GLUCOSE UA: NEGATIVE
KETONES UA: NEGATIVE
Leukocytes, UA: NEGATIVE
Nitrite, UA: NEGATIVE
Protein, UA: NEGATIVE
SPEC GRAV UA: 1.01
Urobilinogen, UA: 0.2
pH, UA: 6.5

## 2015-04-23 LAB — CBC WITH DIFFERENTIAL/PLATELET
BASOS ABS: 0 10*3/uL (ref 0.0–0.1)
Basophils Relative: 0.4 % (ref 0.0–3.0)
EOS ABS: 0.1 10*3/uL (ref 0.0–0.7)
Eosinophils Relative: 0.8 % (ref 0.0–5.0)
HEMATOCRIT: 44.3 % (ref 39.0–52.0)
Hemoglobin: 14.9 g/dL (ref 13.0–17.0)
Lymphocytes Relative: 25 % (ref 12.0–46.0)
Lymphs Abs: 1.6 10*3/uL (ref 0.7–4.0)
MCHC: 33.5 g/dL (ref 30.0–36.0)
MCV: 91.4 fl (ref 78.0–100.0)
Monocytes Absolute: 0.5 10*3/uL (ref 0.1–1.0)
Monocytes Relative: 8.6 % (ref 3.0–12.0)
Neutro Abs: 4.2 10*3/uL (ref 1.4–7.7)
Neutrophils Relative %: 65.2 % (ref 43.0–77.0)
PLATELETS: 203 10*3/uL (ref 150.0–400.0)
RBC: 4.85 Mil/uL (ref 4.22–5.81)
RDW: 13.6 % (ref 11.5–15.5)
WBC: 6.4 10*3/uL (ref 4.0–10.5)

## 2015-04-23 LAB — URINALYSIS, ROUTINE W REFLEX MICROSCOPIC
BILIRUBIN URINE: NEGATIVE
Hgb urine dipstick: NEGATIVE
Ketones, ur: NEGATIVE
Leukocytes, UA: NEGATIVE
Nitrite: NEGATIVE
RBC / HPF: NONE SEEN (ref 0–?)
Specific Gravity, Urine: <=1.005 — AB (ref 1.000–1.030)
Total Protein, Urine: NEGATIVE
Urine Glucose: NEGATIVE
Urobilinogen, UA: 0.2 (ref 0.0–1.0)
WBC UA: NONE SEEN (ref 0–?)
pH: 7 (ref 5.0–8.0)

## 2015-04-23 LAB — COMPREHENSIVE METABOLIC PANEL
ALT: 23 U/L (ref 0–53)
AST: 23 U/L (ref 0–37)
Albumin: 4.4 g/dL (ref 3.5–5.2)
Alkaline Phosphatase: 51 U/L (ref 39–117)
BILIRUBIN TOTAL: 1 mg/dL (ref 0.2–1.2)
BUN: 7 mg/dL (ref 6–23)
CALCIUM: 9.6 mg/dL (ref 8.4–10.5)
CO2: 31 meq/L (ref 19–32)
CREATININE: 0.94 mg/dL (ref 0.40–1.50)
Chloride: 101 mEq/L (ref 96–112)
GFR: 84.99 mL/min (ref >60.00)
Glucose, Bld: 125 mg/dL — ABNORMAL HIGH (ref 70–99)
Potassium: 4.4 mEq/L (ref 3.5–5.1)
Sodium: 139 mEq/L (ref 135–145)
Total Protein: 7.2 g/dL (ref 6.0–8.3)

## 2015-04-23 LAB — AMYLASE: Amylase: 26 U/L — ABNORMAL LOW (ref 27–131)

## 2015-04-23 LAB — LIPASE: LIPASE: 16 U/L (ref 11.0–59.0)

## 2015-04-23 MED ORDER — SUPREP BOWEL PREP KIT 17.5-3.13-1.6 GM/177ML PO SOLN: 1.0000 | Freq: Once | ORAL | Status: DC

## 2015-04-23 NOTE — Progress Notes (Signed)
Pre visit review using our clinic review tool, if applicable. No additional management support is needed unless otherwise documented below in the visit note. 

## 2015-04-23 NOTE — Patient Instructions (Signed)
Get your blood work before you leave   Call or go to the ER if symptoms persist or severe. Particularly if fever, chills.   Keep your upcoming appointment

## 2015-04-23 NOTE — Progress Notes (Signed)
Subjective:    Patient ID: Kevin Webb, male    DOB: 12-May-1946, 69 y.o.   MRN: [id number]  DOS:  04/23/2015 Type of visit - description : Acute Interval history: Symptoms started approximately 2 weeks ago,c/o mild to moderate discomfort at the right side of the abdomen, is there most of the time, ? Increased with food. No radiation. No back pain per se. HTN: Reports he is BPs are very good on average less than 130/80. A chiropractor check his neck and asked the patient to check his carotids with the M.D. (exam is normal)    Review of Systems   no fever chills No nausea, vomiting, diarrhea. Bowel movements are at baseline. No abdominal rash Mild dysuria for few days? No gross hematuria or difficulty urinating.   Past Medical History  Diagnosis Date  . CAD (coronary artery disease)     a.S/P CABG 12/00. b. cath 3/12: L-LAD ok, S-RI 95% tx with BMS/thrombectomy, S-AM-RCA ok, EF 60%.  Marland Kitchen HTN (hypertension)   . GERD (gastroesophageal reflux disease)   . Hyperlipidemia   . Hyperglycemia   . Vitamin D deficiency   . Colonic polyp     X2  . Peyronie disease   . Low testosterone     Dr Evelene Croon, [place] ,[place]  . Sinus bradycardia   . RBBB   . Diverticulitis 2008/2009  . Acute cholecystitis 04/18/2013  . RLS (restless legs syndrome) 08/15/2013  . Osteoarthritis of right knee     With trace effusion  . Impingement syndrome of left shoulder 2016    Past Surgical History  Procedure Laterality Date  . Colonoscopy w/ polypectomy  2004 & 2009     X 2; Dr Russella Dar; due 2014  . Coronary artery bypass graft  04/1999    4 vessels  . Vasectomy    . Coronary stent placement  2012    Plavix  . Coronary angioplasty    . Laparoscopic cholecystectomy  04/21/2013    Dr Jamey Ripa; acutecholecystitis with necrosis  . Cholecystectomy N/A 04/21/2013    Procedure: LAPAROSCOPIC CHOLECYSTECTOMY WITH INTRAOPERATIVE CHOLANGIOGRAM;  Surgeon: Currie Paris, MD;  Location: [facility];  Service: General;   Laterality: N/A;    Social History   Social History  . Marital Status: Single    Spouse Name: N/A  . Number of Children: 1  . Years of Education: N/A   Occupational History  . plumber      plumber   Social History Main Topics  . Smoking status: Never Smoker   . Smokeless tobacco: Never Used  . Alcohol Use: 6.0 - 7.2 oz/week    10-12 Cans of beer per week     Comment: socially   . Drug Use: No  . Sexual Activity: Not on file   Other Topics Concern  . Not on file   Social History Narrative   Remarried 2014        Medication List       This list is accurate as of: 04/23/15  5:49 PM.  Always use your most recent med list.               amLODipine 5 MG tablet  Commonly known as:  NORVASC  Take 1 tablet (5 mg total) by mouth daily.     aspirin 81 MG tablet  Take 81 mg by mouth daily.     co-enzyme Q-10 30 MG capsule  Take 30 mg by mouth daily.     Fish Oil  1000 MG Caps  1 tab po qd     GRAPE SEED PO  Take 1 capsule by mouth daily.     HYDROcodone-acetaminophen 5-325 MG tablet  Commonly known as:  NORCO/VICODIN  Take 1 tablet by mouth every 6 (six) hours as needed for moderate pain.     metoprolol tartrate 25 MG tablet  Commonly known as:  LOPRESSOR  Take 1 tablet (25 mg total) by mouth 2 (two) times daily.     multivitamin tablet  Take 1 tablet by mouth daily.     niacin 1000 MG CR tablet  Commonly known as:  NIASPAN  Take 1 tablet (1,000 mg total) by mouth at bedtime.     nitroGLYCERIN 0.4 MG SL tablet  Commonly known as:  NITROSTAT  Place 1 tablet (0.4 mg total) under the tongue every 5 (five) minutes as needed.     pantoprazole 40 MG tablet  Commonly known as:  PROTONIX  Take 1 tablet (40 mg total) by mouth daily.     ramipril 10 MG capsule  Commonly known as:  ALTACE  Take 1 capsule (10 mg total) by mouth daily.     rosuvastatin 40 MG tablet  Commonly known as:  CRESTOR  Take 1 tablet (40 mg total) by mouth daily.     SAW PALMETTO  PO  Take 1 capsule by mouth daily.     SELENIUM PO  Take 1 tablet by mouth daily.     sildenafil 25 MG tablet  Commonly known as:  VIAGRA  Take 50 mg by mouth daily as needed for erectile dysfunction (per urology).     SUPREP BOWEL PREP Soln  Generic drug:  Na Sulfate-K Sulfate-Mg Sulf  Take 1 kit by mouth once.     valACYclovir 500 MG tablet  Commonly known as:  VALTREX  Take 500 mg by mouth as needed. For cold sore     vitamin C 1000 MG tablet  Take 1,000 mg by mouth daily.     Vitamin D3 5000 UNITS Caps  Take by mouth daily.     vitamin E 100 UNIT capsule  Take 100 Units by mouth daily.           Objective:   Physical Exam  Abdominal:     BP 118/76 mmHg  Pulse 63  Temp(Src) 98.1 F (36.7 C) (Oral)  Ht $R'5\' 8"'Ro$  (1.727 m)  Wt 213 lb 8 oz (96.843 kg)  BMI 32.47 kg/m2  SpO2 95% General:   Well developed, well nourished . NAD.  HEENT:  Normocephalic . Face symmetric, atraumatic Neck: Normal carotid pulses, no murmur Lungs:  CTA B Normal respiratory effort, no intercostal retractions, no accessory muscle use. Heart: RRR,  no murmur.  no pretibial edema bilaterally  Abdomen:  Not distended, soft, non-tender. No rebound or rigidity.  No CVA tenderness  Skin: Not pale. Not jaundice Neurologic:  alert & oriented X3.  Speech normal, gait appropriate for age and unassisted Psych--  Cognition and judgment appear intact.  Cooperative with normal attention span and concentration.  Behavior appropriate. No anxious or depressed appearing.    Assessment & Plan:    Assessment > Prediabetes HTN   on amlodipine since 01-2015 Hyperlipidemia DJD Low testosterone Dr. Francene Boyers [place] GERD CAD CABG 2000, cath 2012 RBBB RLS  vitamin D deficiency Peroyne's disease  PLAN: Right-sided abdominal pain: History of cholecystectomy 2014, ROS + dysuria. UDP negative. Etiology unclear, will get UA, urine culture, labs: CMP, CBC, amylase lipase He  has an upcoming  appointment, we will reassess then. ER or call if symptoms severe. HTN: Well-controlled

## 2015-04-23 NOTE — Assessment & Plan Note (Signed)
Right-sided abdominal pain: History of cholecystectomy 2014, ROS + dysuria. UDP negative. Etiology unclear, will get UA, urine culture, labs: CMP, CBC, amylase lipase He has an upcoming appointment, we will reassess then. ER or call if symptoms severe. HTN: Well-controlled

## 2015-04-25 LAB — URINE CULTURE
Colony Count: NO GROWTH
ORGANISM ID, BACTERIA: NO GROWTH

## 2015-05-02 ENCOUNTER — Ambulatory Visit (AMBULATORY_SURGERY_CENTER): Payer: Medicare Other | Admitting: Gastroenterology

## 2015-05-02 ENCOUNTER — Encounter: Payer: Self-pay | Admitting: Gastroenterology

## 2015-05-02 VITALS — BP 99/60 | HR 68 | Temp 97.0°F | Resp 18 | Ht 69.0 in | Wt 215.0 lb

## 2015-05-02 DIAGNOSIS — D122 Benign neoplasm of ascending colon: Secondary | ICD-10-CM | POA: Diagnosis not present

## 2015-05-02 DIAGNOSIS — Z86010 Personal history of colonic polyps: Secondary | ICD-10-CM | POA: Diagnosis present

## 2015-05-02 DIAGNOSIS — Z1211 Encounter for screening for malignant neoplasm of colon: Secondary | ICD-10-CM | POA: Diagnosis not present

## 2015-05-02 DIAGNOSIS — I2510 Atherosclerotic heart disease of native coronary artery without angina pectoris: Secondary | ICD-10-CM | POA: Diagnosis not present

## 2015-05-02 MED ORDER — SODIUM CHLORIDE 0.9 % IV SOLN: 500.0000 mL | INTRAVENOUS | Status: DC

## 2015-05-02 NOTE — Op Note (Signed)
[facility]  [address] [place], [id number]   COLONOSCOPY PROCEDURE REPORT  PATIENT: Kevin Webb, Kevin Webb  MR#: [id number] BIRTHDATE: 02-09-1946 , 69  yrs. old GENDER: male ENDOSCOPIST: Ladene Artist, MD, [facility] PROCEDURE DATE:  05/02/2015 PROCEDURE:   Colonoscopy, surveillance and Colonoscopy with snare polypectomy First Screening Colonoscopy - Avg.  risk and is 50 yrs.  old or older - No.  Prior Negative Screening - Now for repeat screening. N/A  History of Adenoma - Now for follow-up colonoscopy & has been > or = to 3 yrs.  Yes hx of adenoma.  Has been 3 or more years since last colonoscopy.  Polyps removed today? Yes ASA CLASS:   Class II INDICATIONS:Surveillance due to prior colonic neoplasia and PH Colon Adenoma. MEDICATIONS: Monitored anesthesia care and Propofol 250 mg IV DESCRIPTION OF PROCEDURE:   After the risks benefits and alternatives of the procedure were thoroughly explained, informed consent was obtained.  The digital rectal exam revealed no abnormalities of the rectum.   The [id number]  endoscope was introduced through the anus and advanced to the cecum, which was identified by both the appendix and ileocecal valve. No adverse events experienced.   The quality of the prep was excellent. (Suprep was used)  The instrument was then slowly withdrawn as the colon was fully examined. Estimated blood loss is zero unless otherwise noted in this procedure report.    COLON FINDINGS: A sessile polyp measuring 6 mm in size was found in the ascending colon.  A polypectomy was performed with a cold snare.  The resection was complete, the polyp tissue was completely retrieved and sent to histology.   There was moderate diverticulosis noted in the sigmoid colon and descending colon. The examination was otherwise normal.  Retroflexed views revealed no abnormalities. The time to cecum = 1.4 Withdrawal time = 8.2 The scope was withdrawn and the procedure  completed. COMPLICATIONS: There were no immediate complications.  ENDOSCOPIC IMPRESSION: 1.   Sessile polyp in the ascending colon; polypectomy performed with a cold snare 2.   Moderate diverticulosis in the sigmoid colon and descending colon  RECOMMENDATIONS: 1.  Await pathology results 2.  High fiber diet with liberal fluid intake. 3.  Repeat Colonoscopy in 5 years.  eSigned:  Ladene Artist, MD, [facility] 05/02/2015 2:18 PM

## 2015-05-02 NOTE — Progress Notes (Signed)
A/ox3 pleased with MAC, report to Jill RN 

## 2015-05-02 NOTE — Progress Notes (Signed)
Called to room to assist during endoscopic procedure.  Patient ID and intended procedure confirmed with present staff. Received instructions for my participation in the procedure from the performing physician.  

## 2015-05-02 NOTE — Patient Instructions (Signed)
YOU HAD AN ENDOSCOPIC PROCEDURE TODAY AT THE [facility]:   Refer to the procedure report that was given to you for any specific questions about what was found during the examination.  If the procedure report does not answer your questions, please call your gastroenterologist to clarify.  If you requested that your care partner not be given the details of your procedure findings, then the procedure report has been included in a sealed envelope for you to review at your convenience later.  YOU SHOULD EXPECT: Some feelings of bloating in the abdomen. Passage of more gas than usual.  Walking can help get rid of the air that was put into your GI tract during the procedure and reduce the bloating. If you had a lower endoscopy (such as a colonoscopy or flexible sigmoidoscopy) you may notice spotting of blood in your stool or on the toilet paper. If you underwent a bowel prep for your procedure, you may not have a normal bowel movement for a few days.  Please Note:  You might notice some irritation and congestion in your nose or some drainage.  This is from the oxygen used during your procedure.  There is no need for concern and it should clear up in a day or so.  SYMPTOMS TO REPORT IMMEDIATELY:   Following lower endoscopy (colonoscopy or flexible sigmoidoscopy):  Excessive amounts of blood in the stool  Significant tenderness or worsening of abdominal pains  Swelling of the abdomen that is new, acute  Fever of 100F or higher   For urgent or emergent issues, a gastroenterologist can be reached at any hour by calling [phone].   DIET: Your first meal following the procedure should be a small meal and then it is ok to progress to your normal diet. Heavy or fried foods are harder to digest and may make you feel nauseous or bloated.  Likewise, meals heavy in dairy and vegetables can increase bloating.  Drink plenty of fluids but you should avoid alcoholic beverages for 24  hours.  ACTIVITY:  You should plan to take it easy for the rest of today and you should NOT DRIVE or use heavy machinery until tomorrow (because of the sedation medicines used during the test).    FOLLOW UP: Our staff will call the number listed on your records the next business day following your procedure to check on you and address any questions or concerns that you may have regarding the information given to you following your procedure. If we do not reach you, we will leave a message.  However, if you are feeling well and you are not experiencing any problems, there is no need to return our call.  We will assume that you have returned to your regular daily activities without incident.  If any biopsies were taken you will be contacted by phone or by letter within the next 1-3 weeks.  Please call us at [phone] if you have not heard about the biopsies in 3 weeks.    SIGNATURES/CONFIDENTIALITY: You and/or your care partner have signed paperwork which will be entered into your electronic medical record.  These signatures attest to the fact that that the information above on your After Visit Summary has been reviewed and is understood.  Full responsibility of the confidentiality of this discharge information lies with you and/or your care-partner. 

## 2015-05-03 ENCOUNTER — Telehealth: Payer: Self-pay | Admitting: Emergency Medicine

## 2015-05-03 NOTE — Telephone Encounter (Signed)
  Follow up Call-  Call back number 05/02/2015  Post procedure Call Back phone  # [phone]  Permission to leave phone message Yes     Patient questions:  Do you have a fever, pain , or abdominal swelling? No. Pain Score  0 *  Have you tolerated food without any problems? Yes.    Have you been able to return to your normal activities? Yes.    Do you have any questions about your discharge instructions: Diet   No. Medications  No. Follow up visit  No.  Do you have questions or concerns about your Care? No.  Actions: * If pain score is 4 or above: No action needed, pain <4.

## 2015-05-10 ENCOUNTER — Encounter: Payer: Self-pay | Admitting: Gastroenterology

## 2015-05-29 ENCOUNTER — Encounter: Payer: BLUE CROSS/BLUE SHIELD | Admitting: Internal Medicine

## 2015-06-17 ENCOUNTER — Ambulatory Visit (INDEPENDENT_AMBULATORY_CARE_PROVIDER_SITE_OTHER): Payer: Medicare Other | Admitting: Gastroenterology

## 2015-06-17 ENCOUNTER — Encounter: Payer: Self-pay | Admitting: Gastroenterology

## 2015-06-17 VITALS — BP 122/62 | HR 72 | Ht 68.0 in | Wt 216.4 lb

## 2015-06-17 DIAGNOSIS — K5730 Diverticulosis of large intestine without perforation or abscess without bleeding: Secondary | ICD-10-CM | POA: Diagnosis not present

## 2015-06-17 DIAGNOSIS — Z86010 Personal history of colonic polyps: Secondary | ICD-10-CM | POA: Diagnosis not present

## 2015-06-17 DIAGNOSIS — R1031 Right lower quadrant pain: Secondary | ICD-10-CM | POA: Diagnosis not present

## 2015-06-17 DIAGNOSIS — K219 Gastro-esophageal reflux disease without esophagitis: Secondary | ICD-10-CM

## 2015-06-17 NOTE — Progress Notes (Signed)
    History of Present Illness: This is a 70 year old male complaining of right lower quadrant pain. His symptoms were present for about 2-3 weeks however they resolved about 2 weeks ago. He states his symptoms were present primarily with movement and bending. They did not appear to relate any digestive function. He underwent surveillance colonoscopy in December 2016 showing moderate diverticulosis. His reflux symptoms are well controlled on daily pantoprazole. Blood work performed in early December was unremarkable except for a mildly elevated glucose. Denies weight loss, constipation, diarrhea, change in stool caliber, melena, hematochezia, nausea, vomiting, dysphagia, reflux symptoms, chest pain.  Current Medications, Allergies, Past Medical History, Past Surgical History, Family History and Social History were reviewed in [employer] record.  Physical Exam: General: Well developed, well nourished, no acute distress Head: Normocephalic and atraumatic Eyes:  sclerae anicteric, EOMI Ears: Normal auditory acuity Mouth: No deformity or lesions Lungs: Clear throughout to auscultation Heart: Regular rate and rhythm; no murmurs, rubs or bruits Abdomen: Soft, non tender and non distended. No masses, hepatosplenomegaly or hernias noted. Normal Bowel sounds Rectal: normal at colonoscopy in 04/2015 Musculoskeletal: Symmetrical with no gross deformities  Pulses:  Normal pulses noted Extremities: No clubbing, cyanosis, edema or deformities noted Neurological: Alert oriented x 4, grossly nonfocal Psychological:  Alert and cooperative. Normal mood and affect  Assessment and Recommendations:  1. Right lower quadrant pain, resolved. Suspected musculoskeletal symptoms. Contact us if symptoms recur.  2. Chronic GERD. Continue pantoprazole 40 mg daily and standard antireflux measures.  3. Diverticulosis. Long-term high fiber diet with adequate daily water intake.  4. Personal  history of adenomatous colon polyps. Five-year interval surveillance colonoscopy recommended in December 2021.

## 2015-06-17 NOTE — Patient Instructions (Signed)
Follow as needed.   You will be due for a recall colonoscopy in 04/2020. We will send you a reminder in the mail when it gets closer to that time.  Thank you for choosing me and [facility].  Pricilla Riffle. Dagoberto Ligas., MD., Marval Regal

## 2015-07-24 ENCOUNTER — Encounter: Payer: Self-pay | Admitting: Gastroenterology

## 2015-08-26 ENCOUNTER — Other Ambulatory Visit: Payer: Self-pay

## 2015-08-26 DIAGNOSIS — E785 Hyperlipidemia, unspecified: Secondary | ICD-10-CM

## 2015-08-26 MED ORDER — PANTOPRAZOLE SODIUM 40 MG PO TBEC: 40.0000 mg | DELAYED_RELEASE_TABLET | Freq: Every day | ORAL | Status: DC

## 2015-08-26 MED ORDER — NIACIN ER (ANTIHYPERLIPIDEMIC) 1000 MG PO TBCR: 1000.0000 mg | EXTENDED_RELEASE_TABLET | Freq: Every day | ORAL | Status: DC

## 2015-08-26 MED ORDER — ROSUVASTATIN CALCIUM 40 MG PO TABS: 40.0000 mg | ORAL_TABLET | Freq: Every day | ORAL | Status: DC

## 2015-09-26 ENCOUNTER — Encounter: Payer: Self-pay | Admitting: Behavioral Health

## 2015-09-26 ENCOUNTER — Telehealth: Payer: Self-pay | Admitting: Behavioral Health

## 2015-09-26 NOTE — Telephone Encounter (Signed)
Pre-Visit Call completed with patient and chart updated.   Pre-Visit Info documented in Specialty Comments under [employer].    

## 2015-09-27 ENCOUNTER — Encounter: Payer: Self-pay | Admitting: Internal Medicine

## 2015-09-27 ENCOUNTER — Ambulatory Visit (INDEPENDENT_AMBULATORY_CARE_PROVIDER_SITE_OTHER): Payer: Medicare Other | Admitting: Internal Medicine

## 2015-09-27 VITALS — BP 122/78 | HR 66 | Temp 98.0°F | Ht 68.0 in | Wt 213.5 lb

## 2015-09-27 DIAGNOSIS — Z09 Encounter for follow-up examination after completed treatment for conditions other than malignant neoplasm: Secondary | ICD-10-CM

## 2015-09-27 DIAGNOSIS — Z23 Encounter for immunization: Secondary | ICD-10-CM

## 2015-09-27 DIAGNOSIS — I10 Essential (primary) hypertension: Secondary | ICD-10-CM

## 2015-09-27 DIAGNOSIS — R7303 Prediabetes: Secondary | ICD-10-CM | POA: Diagnosis not present

## 2015-09-27 DIAGNOSIS — R739 Hyperglycemia, unspecified: Secondary | ICD-10-CM | POA: Diagnosis not present

## 2015-09-27 DIAGNOSIS — Z0000 Encounter for general adult medical examination without abnormal findings: Secondary | ICD-10-CM | POA: Diagnosis not present

## 2015-09-27 DIAGNOSIS — R7309 Other abnormal glucose: Secondary | ICD-10-CM | POA: Diagnosis not present

## 2015-09-27 DIAGNOSIS — E785 Hyperlipidemia, unspecified: Secondary | ICD-10-CM | POA: Diagnosis not present

## 2015-09-27 DIAGNOSIS — G2581 Restless legs syndrome: Secondary | ICD-10-CM

## 2015-09-27 LAB — BASIC METABOLIC PANEL
BUN: 10 mg/dL (ref 7–25)
CALCIUM: 9.2 mg/dL (ref 8.6–10.3)
CO2: 24 mmol/L (ref 20–31)
Chloride: 102 mmol/L (ref 98–110)
Creat: 0.91 mg/dL (ref 0.70–1.25)
GLUCOSE: 100 mg/dL — AB (ref 65–99)
POTASSIUM: 4.4 mmol/L (ref 3.5–5.3)
SODIUM: 138 mmol/L (ref 135–146)

## 2015-09-27 LAB — LIPID PANEL
CHOL/HDL RATIO: 3.8 ratio (ref <=5.0)
Cholesterol: 181 mg/dL (ref 125–200)
HDL: 48 mg/dL (ref >=40)
LDL Cholesterol: 95 mg/dL (ref <130)
Triglycerides: 192 mg/dL — ABNORMAL HIGH (ref <150)
VLDL: 38 mg/dL — AB (ref <30)

## 2015-09-27 LAB — HEMOGLOBIN A1C
HEMOGLOBIN A1C: 6.5 % — AB (ref <5.7)
MEAN PLASMA GLUCOSE: 140 mg/dL

## 2015-09-27 NOTE — Progress Notes (Signed)
Pre visit review using our clinic review tool, if applicable. No additional management support is needed unless otherwise documented below in the visit note. 

## 2015-09-27 NOTE — Progress Notes (Signed)
Subjective:    Patient ID: Kevin Webb, male    DOB: 03-26-1946, 70 y.o.   MRN: [id number]  DOS:  09/27/2015 Type of visit - description :  Here for Medicare AWV:  1. Risk factors based on Past M, S, F history: reviewed 2. Physical Activities: active at work, Development worker, community, has a farm 3. Depression/mood: neg screening  4. Hearing: has some problems , got tested 5 years ago >>> no problems noted during normal conversation 5. ADL's: independent  6. Fall Risk: had a fall , horse accident, june 2015 7. home Safety: does feel safe at home  8. Height, weight, & visual acuity: see VS, vision ok, rec to see eye doctor regularly  9. Counseling: provided 10. Labs ordered based on risk factors: if needed  11. Referral Coordination: if needed 12. Care Plan, see assessment and plan  13. Cognitive Assessment: motor and cognition above normal for age  54. Care team updated 15. Personalized plan provided, see instructions 16. HC-POA discussed, has one   In addition, today we discussed the following: HTN: Good med compliance  , ambulatory BPs usually in the 130s,  occ BP in the  140s 150s usually in the morning, back to normal after he takes his medication CAD, cardiology note from 9 -2016 reviewed, they added amlodipine. High cholesterol: Good compliance with medications RLS: Refill clonazepam? A month ago he did some heavy lifting at work, shortly after noted a left scrotal discomfort, since then he occasionally has discomfort in that area with lifting. No mass or hernia that he can tell.    BP Readings from Last 3 Encounters:  09/27/15 122/78  06/17/15 122/62  05/02/15 99/60     Review of Systems  Constitutional: No fever. No chills. No unexplained wt changes. No unusual sweats  HEENT: No dental problems, no ear discharge, no facial swelling, no voice changes. No eye discharge, no eye  redness , no  intolerance to light   Respiratory: No wheezing , no  difficulty breathing. No  cough , no mucus production  Cardiovascular: No CP, no leg swelling , no  Palpitations  GI: no nausea, no vomiting, no diarrhea , no  abdominal pain.  No blood in the stools. No dysphagia, no odynophagia    Endocrine: No polyphagia, no polyuria , no polydipsia  GU: Has chronic urinary symptoms: Urinary frequency, nocturia. And baseline.  Musculoskeletal:   occ msk pains, plans to see his orthopedic doctor  Skin: No change in the color of the skin, palor , no  Rash  Allergic, immunologic: No environmental allergies , no  food allergies  Neurological: No dizziness no  syncope. No headaches. No diplopia, no slurred, no slurred speech, no motor deficits, no facial  Numbness  Hematological: No enlarged lymph nodes, no easy bruising , no unusual bleedings  Psychiatry: No suicidal ideas, no hallucinations, no beavior problems, no confusion.  No unusual/severe anxiety, no depression  Past Medical History  Diagnosis Date  . CAD (coronary artery disease)     a.S/P CABG 12/00. b. cath 3/12: L-LAD ok, S-RI 95% tx with BMS/thrombectomy, S-AM-RCA ok, EF 60%.  Marland Kitchen HTN (hypertension)   . GERD (gastroesophageal reflux disease)   . Hyperlipidemia   . Hyperglycemia   . Vitamin D deficiency   . Adenomatous colon polyp 12/2004  . Peyronie disease   . Low testosterone     Dr [name], [place] ,[place]  . Sinus bradycardia   . RBBB   . Diverticulitis 2008/2009  .  Acute cholecystitis 04/18/2013  . RLS (restless legs syndrome) 08/15/2013  . Osteoarthritis of right knee     With trace effusion  . Impingement syndrome of left shoulder 2016    Past Surgical History  Procedure Laterality Date  . Colonoscopy w/ polypectomy  2004 & 2009     X 2; Dr Fuller Plan; due 2014  . Coronary artery bypass graft  04/1999    4 vessels  . Vasectomy    . Coronary stent placement  2012    Plavix  . Coronary angioplasty    . Laparoscopic cholecystectomy  04/21/2013    Dr Margot Chimes; acutecholecystitis with necrosis  .  Cholecystectomy N/A 04/21/2013    Procedure: LAPAROSCOPIC CHOLECYSTECTOMY WITH INTRAOPERATIVE CHOLANGIOGRAM;  Surgeon: Haywood Lasso, MD;  Location: [facility];  Service: General;  Laterality: N/A;    Social History   Social History  . Marital Status: Single    Spouse Name: N/A  . Number of Children: 1  . Years of Education: N/A   Occupational History  . plumber      plumber   Social History Main Topics  . Smoking status: Never Smoker   . Smokeless tobacco: Never Used  . Alcohol Use: 6.0 - 7.2 oz/week    10-12 Cans of beer per week     Comment: socially   . Drug Use: No  . Sexual Activity: Not on file   Other Topics Concern  . Not on file   Social History Narrative   Remarried 2014     Family History  Problem Relation Age of Onset  . Aortic aneurysm Father 20    ? abdominal  . Diabetes      PGaunt  . Coronary artery disease Mother     CABG in 97s  . Diabetes Brother   . Cancer Neg Hx   . CVA Mother     cns bleed from warfarin  . Heart attack Father 39  . Cholecystitis Sister   . Colon cancer Neg Hx   . Prostate cancer Neg Hx       Medication List       This list is accurate as of: 09/27/15  1:41 PM.  Always use your most recent med list.               amLODipine 5 MG tablet  Commonly known as:  NORVASC  Take 1 tablet (5 mg total) by mouth daily.     aspirin 81 MG tablet  Take 81 mg by mouth daily.     co-enzyme Q-10 30 MG capsule  Take 30 mg by mouth daily.     Fish Oil 1000 MG Caps  1 tab po qd     GRAPE SEED PO  Take 1 capsule by mouth daily. Reported on 09/26/2015     HYDROcodone-acetaminophen 5-325 MG tablet  Commonly known as:  NORCO/VICODIN  Take 1 tablet by mouth every 6 (six) hours as needed for moderate pain. Reported on 09/26/2015     metoprolol tartrate 25 MG tablet  Commonly known as:  LOPRESSOR  Take 1 tablet (25 mg total) by mouth 2 (two) times daily.     multivitamin tablet  Take 1 tablet by mouth daily. Reported on  09/26/2015     niacin 1000 MG CR tablet  Commonly known as:  NIASPAN  Take 1 tablet (1,000 mg total) by mouth at bedtime.     nitroGLYCERIN 0.4 MG SL tablet  Commonly known as:  NITROSTAT  Place 1  tablet (0.4 mg total) under the tongue every 5 (five) minutes as needed.     pantoprazole 40 MG tablet  Commonly known as:  PROTONIX  Take 1 tablet (40 mg total) by mouth daily.     ramipril 10 MG capsule  Commonly known as:  ALTACE  Take 1 capsule (10 mg total) by mouth daily.     rosuvastatin 40 MG tablet  Commonly known as:  CRESTOR  Take 1 tablet (40 mg total) by mouth daily.     SAW PALMETTO PO  Take 1 capsule by mouth daily.     SELENIUM PO  Take 1 tablet by mouth daily. Reported on 09/26/2015     sildenafil 25 MG tablet  Commonly known as:  VIAGRA  Take 50 mg by mouth daily as needed for erectile dysfunction (per urology). Reported on 09/26/2015     valACYclovir 500 MG tablet  Commonly known as:  VALTREX  Take 500 mg by mouth as needed. Reported on 09/26/2015     vitamin C 1000 MG tablet  Take 1,000 mg by mouth daily. Reported on 09/26/2015     Vitamin D3 5000 units Caps  Take by mouth daily.     vitamin E 100 UNIT capsule  Take 100 Units by mouth daily.           Objective:   Physical Exam BP 122/78 mmHg  Pulse 66  Temp(Src) 98 F (36.7 C) (Oral)  Ht 5\' 8"  (1.727 m)  Wt 213 lb 8 oz (96.843 kg)  BMI 32.47 kg/m2  SpO2 98% General:   Well developed, well nourished . NAD.  HEENT:  Normocephalic . Face symmetric, atraumatic Lungs:  CTA B Normal respiratory effort, no intercostal retractions, no accessory muscle use. Heart: RRR,  no murmur.  no pretibial edema bilaterally  Abdomen:  Not distended, soft, non-tender. No rebound or rigidity. No bruit. GU: Normal scrotal contents, penis normal to inspection on palpation, no obvious inguinal hernias. Skin: Not pale. Not jaundice Neurologic:  alert & oriented X3.  Speech normal, gait appropriate for age and  unassisted Psych--  Cognition and judgment appear intact.  Cooperative with normal attention span and concentration.  Behavior appropriate. No anxious or depressed appearing.    Assessment & Plan:   Assessment > Prediabetes HTN   on amlodipine since 01-2015 Hyperlipidemia DJD Low testosterone Dr. Francene Boyers Ness GERD CAD CABG 2000, cath 2012 RBBB RLS  vitamin D deficiency Peroyne's disease Sees dermatology   PLAN: Prediabetes: Diet and exercise discussed, check A1c HTN: BPs at the office very good, occasionally at home sees a BP of 1 40-150 in AM. Recommend to change the timing of amlodipine 5 mg to bedtime. If he continue with occasional BP excursions in the 150s to let me know. Will increase amlodipine dose. Hyperlipidemia: Last LDH slightly elevated, recheck labs today. CAD: Asymptomatic RLS: Declined a refill on clonazepam b/c is a controlled substance, will take OTCs, encouraged to call me if he needs something else. Discomfort is a scrotum: No evidence of hernia clinical grounds, recommend observation and to discuss symptoms with urology. RTC 6 months

## 2015-09-27 NOTE — Assessment & Plan Note (Addendum)
Td 2010; [id number]; PNM shot-- 2015 ; pnm13 09-2015  cscope 12-2009, benign polyps, repeated cscope done 04-2015 PSAs per urology Diet-exercise discussed

## 2015-09-27 NOTE — Patient Instructions (Addendum)
Get your blood work before you leave   Next visit in 6 months.  Check the  blood pressure 2 or 3 times a  week  Be sure your blood pressure is between 110/65 and  135/85.  if it is consistently higher or lower, let me know      Fall Prevention and Home Safety Falls cause injuries and can affect all age groups. It is possible to use preventive measures to significantly decrease the likelihood of falls. There are many simple measures which can make your home safer and prevent falls. OUTDOORS  Repair cracks and edges of walkways and driveways.  Remove high doorway thresholds.  Trim shrubbery on the main path into your home.  Have good outside lighting.  Clear walkways of tools, rocks, debris, and clutter.  Check that handrails are not broken and are securely fastened. Both sides of steps should have handrails.  Have leaves, snow, and ice cleared regularly.  Use sand or salt on walkways during winter months.  In the garage, clean up grease or oil spills. BATHROOM  Install night lights.  Install grab bars by the toilet and in the tub and shower.  Use non-skid mats or decals in the tub or shower.  Place a plastic non-slip stool in the shower to sit on, if needed.  Keep floors dry and clean up all water on the floor immediately.  Remove soap buildup in the tub or shower on a regular basis.  Secure bath mats with non-slip, double-sided rug tape.  Remove throw rugs and tripping hazards from the floors. BEDROOMS  Install night lights.  Make sure a bedside light is easy to reach.  Do not use oversized bedding.  Keep a telephone by your bedside.  Have a firm chair with side arms to use for getting dressed.  Remove throw rugs and tripping hazards from the floor. KITCHEN  Keep handles on pots and pans turned toward the center of the stove. Use back burners when possible.  Clean up spills quickly and allow time for drying.  Avoid walking on wet floors.  Avoid hot  utensils and knives.  Position shelves so they are not too high or low.  Place commonly used objects within easy reach.  If necessary, use a sturdy step stool with a grab bar when reaching.  Keep electrical cables out of the way.  Do not use floor polish or wax that makes floors slippery. If you must use wax, use non-skid floor wax.  Remove throw rugs and tripping hazards from the floor. STAIRWAYS  Never leave objects on stairs.  Place handrails on both sides of stairways and use them. Fix any loose handrails. Make sure handrails on both sides of the stairways are as long as the stairs.  Check carpeting to make sure it is firmly attached along stairs. Make repairs to worn or loose carpet promptly.  Avoid placing throw rugs at the top or bottom of stairways, or properly secure the rug with carpet tape to prevent slippage. Get rid of throw rugs, if possible.  Have an electrician put in a light switch at the top and bottom of the stairs. OTHER FALL PREVENTION TIPS  Wear low-heel or rubber-soled shoes that are supportive and fit well. Wear closed toe shoes.  When using a stepladder, make sure it is fully opened and both spreaders are firmly locked. Do not climb a closed stepladder.  Add color or contrast paint or tape to grab bars and handrails in your home. Place  contrasting color strips on first and last steps.  Learn and use mobility aids as needed. Install an electrical emergency response system.  Turn on lights to avoid dark areas. Replace light bulbs that burn out immediately. Get light switches that glow.  Arrange furniture to create clear pathways. Keep furniture in the same place.  Firmly attach carpet with non-skid or double-sided tape.  Eliminate uneven floor surfaces.  Select a carpet pattern that does not visually hide the edge of steps.  Be aware of all pets. OTHER HOME SAFETY TIPS  Set the water temperature for 120 F (48.8 C).  Keep emergency numbers on  or near the telephone.  Keep smoke detectors on every level of the home and near sleeping areas. Document Released: 04/24/2002 Document Revised: 11/03/2011 Document Reviewed: 07/24/2011 [facility] Patient Information 2015 [place], [place]. This information is not intended to replace advice given to you by your health care provider. Make sure you discuss any questions you have with your health care provider.   Preventive Care for Adults Ages 19 and over  Blood pressure check.** / Every 1 to 2 years.  Lipid and cholesterol check.**/ Every 5 years beginning at age 76.  Lung cancer screening. / Every year if you are aged 56-80 years and have a 30-pack-year history of smoking and currently smoke or have quit within the past 15 years. Yearly screening is stopped once you have quit smoking for at least 15 years or develop a health problem that would prevent you from having lung cancer treatment.  Fecal occult blood test (FOBT) of stool. / Every year beginning at age 21 and continuing until age 75. You may not have to do this test if you get a colonoscopy every 10 years.  Flexible sigmoidoscopy** or colonoscopy.** / Every 5 years for a flexible sigmoidoscopy or every 10 years for a colonoscopy beginning at age 28 and continuing until age 53.  Hepatitis C blood test.** / For all people born from [birth year] through 1963 and any individual with known risks for hepatitis C.  Abdominal aortic aneurysm (AAA) screening.** / A one-time screening for ages 56 to 51 years who are current or former smokers.  Skin self-exam. / Monthly.  Influenza vaccine. / Every year.  Tetanus, diphtheria, and acellular pertussis (Tdap/Td) vaccine.** / 1 dose of Td every 10 years.  Varicella vaccine.** / Consult your health care provider.  Zoster vaccine.** / 1 dose for adults aged 58 years or older.  Pneumococcal 13-valent conjugate (PCV13) vaccine.** / Consult your health care provider.  Pneumococcal polysaccharide (PPSV23)  vaccine.** / 1 dose for all adults aged 81 years and older.  Meningococcal vaccine.** / Consult your health care provider.  Hepatitis A vaccine.** / Consult your health care provider.  Hepatitis B vaccine.** / Consult your health care provider.  Haemophilus influenzae type b (Hib) vaccine.** / Consult your health care provider. **Family history and personal history of risk and conditions may change your health care provider's recommendations. Document Released: 06/30/2001 Document Revised: 05/09/2013 Document Reviewed: 09/29/2010 [facility] Patient Information 2015 [place], [place]. This information is not intended to replace advice given to you by your health care provider. Make sure you discuss any questions you have with your health care provider.

## 2015-09-30 NOTE — Assessment & Plan Note (Signed)
Prediabetes: Diet and exercise discussed, check A1c HTN: BPs at the office very good, occasionally at home sees a BP of 1 40-150 in AM. Recommend to change the timing of amlodipine 5 mg to bedtime. If he continue with occasional BP excursions in the 150s to let me know. Will increase amlodipine dose. Hyperlipidemia: Last LDH slightly elevated, recheck labs today. CAD: Asymptomatic RLS: Declined a refill on clonazepam b/c is a controlled substance, will take OTCs, encouraged to call me if he needs something else. Discomfort is a scrotum: No evidence of hernia clinical grounds, recommend observation and to discuss symptoms with urology. RTC 6 months

## 2015-10-01 MED ORDER — EZETIMIBE 10 MG PO TABS: 10.0000 mg | ORAL_TABLET | Freq: Every day | ORAL | Status: DC

## 2015-10-01 NOTE — Addendum Note (Signed)
Addended byDamita Dunnings D on: 10/01/2015 05:22 PM   Modules accepted: Orders

## 2015-11-04 DIAGNOSIS — H25813 Combined forms of age-related cataract, bilateral: Secondary | ICD-10-CM | POA: Diagnosis not present

## 2015-11-25 ENCOUNTER — Other Ambulatory Visit: Payer: Self-pay

## 2015-11-25 MED ORDER — NIACIN ER (ANTIHYPERLIPIDEMIC) 1000 MG PO TBCR: 1000.0000 mg | EXTENDED_RELEASE_TABLET | Freq: Every day | ORAL | Status: DC

## 2015-11-25 MED ORDER — PANTOPRAZOLE SODIUM 40 MG PO TBEC: 40.0000 mg | DELAYED_RELEASE_TABLET | Freq: Every day | ORAL | Status: DC

## 2015-12-05 ENCOUNTER — Other Ambulatory Visit: Payer: Self-pay

## 2015-12-05 MED ORDER — METOPROLOL TARTRATE 25 MG PO TABS: 25.0000 mg | ORAL_TABLET | Freq: Two times a day (BID) | ORAL | Status: DC

## 2015-12-30 ENCOUNTER — Other Ambulatory Visit: Payer: Self-pay

## 2015-12-30 MED ORDER — RAMIPRIL 10 MG PO CAPS: 10.0000 mg | ORAL_CAPSULE | Freq: Every day | ORAL | 1 refills | Status: DC

## 2016-01-13 ENCOUNTER — Encounter: Payer: Self-pay | Admitting: Cardiology

## 2016-01-23 ENCOUNTER — Other Ambulatory Visit: Payer: Self-pay

## 2016-01-23 MED ORDER — METOPROLOL TARTRATE 25 MG PO TABS: 25.0000 mg | ORAL_TABLET | Freq: Two times a day (BID) | ORAL | 1 refills | Status: DC

## 2016-01-26 NOTE — Progress Notes (Signed)
HPI The patient presents for evaluation of CAD.  Since I last saw him he has done well.  The patient denies any new symptoms such as chest discomfort, neck or arm discomfort. There has been no new shortness of breath, PND or orthopnea. There have been no reported palpitations, presyncope or syncope.    He is very active at work.   At the last visit I did add Norvasc if blood pressure seems to be well controlled. In addition his lipids were not at target and Dr. Larose Kells added [place].  Allergies  Allergen Reactions  . Oxycodone-Acetaminophen     REACTION: RASH - tolerated morphine  Note : Taking oxycodone/APAP 5-325 postoperatively 12/14 without issue  . Darvocet [Propoxyphene N-Acetaminophen]   . Propoxyphene Hives    Current Outpatient Prescriptions  Medication Sig Dispense Refill  . amLODipine (NORVASC) 5 MG tablet Take 1 tablet (5 mg total) by mouth daily. 180 tablet 3  . Ascorbic Acid (VITAMIN C) 1000 MG tablet Take 1,000 mg by mouth daily. Reported on 09/26/2015    . aspirin 81 MG tablet Take 81 mg by mouth daily.    Marland Kitchen Bioflavonoid Products (GRAPE SEED PO) Take 1 capsule by mouth daily. Reported on 09/26/2015    . Cholecalciferol (VITAMIN D3) 5000 UNITS CAPS Take by mouth daily.    Marland Kitchen co-enzyme Q-10 30 MG capsule Take 30 mg by mouth daily.     Marland Kitchen ezetimibe (ZETIA) 10 MG tablet Take 1 tablet (10 mg total) by mouth daily. 30 tablet 6  . metoprolol tartrate (LOPRESSOR) 25 MG tablet Take 1 tablet (25 mg total) by mouth 2 (two) times daily. 180 tablet 1  . Multiple Vitamin (MULTIVITAMIN) tablet Take 1 tablet by mouth daily. Reported on 09/26/2015    . niacin (NIASPAN) 1000 MG CR tablet Take 1 tablet (1,000 mg total) by mouth at bedtime. 90 tablet 1  . nitroGLYCERIN (NITROSTAT) 0.4 MG SL tablet Place 1 tablet (0.4 mg total) under the tongue every 5 (five) minutes as needed. 25 tablet 5  . Omega-3 Fatty Acids (FISH OIL) 1000 MG CAPS 1 tab po qd     . pantoprazole (PROTONIX) 40 MG tablet Take 1  tablet (40 mg total) by mouth daily. 90 tablet 1  . ramipril (ALTACE) 10 MG capsule Take 1 capsule (10 mg total) by mouth daily. 90 capsule 1  . rosuvastatin (CRESTOR) 40 MG tablet Take 1 tablet (40 mg total) by mouth daily. 90 tablet 0  . Saw Palmetto, Serenoa repens, (SAW PALMETTO PO) Take 1 capsule by mouth daily.    . SELENIUM PO Take 1 tablet by mouth daily. Reported on 09/26/2015    . sildenafil (VIAGRA) 25 MG tablet Take 50 mg by mouth daily as needed for erectile dysfunction (per urology). Reported on 09/26/2015    . valACYclovir (VALTREX) 500 MG tablet Take 500 mg by mouth as needed. Reported on 09/26/2015    . vitamin E 100 UNIT capsule Take 100 Units by mouth daily.       No current facility-administered medications for this visit.     Past Medical History:  Diagnosis Date  . Acute cholecystitis 04/18/2013  . Adenomatous colon polyp 12/2004  . CAD (coronary artery disease)    a.S/P CABG 12/00. b. cath 3/12: L-LAD ok, S-RI 95% tx with BMS/thrombectomy, S-AM-RCA ok, EF 60%.  . Diverticulitis 2008/2009  . GERD (gastroesophageal reflux disease)   . HTN (hypertension)   . Hyperglycemia   . Hyperlipidemia   .  Impingement syndrome of left shoulder 2016  . Low testosterone    Dr [name], [place] ,[place]  . Osteoarthritis of right knee    With trace effusion  . Peyronie disease   . RBBB   . RLS (restless legs syndrome) 08/15/2013  . Sinus bradycardia   . Vitamin D deficiency     Past Surgical History:  Procedure Laterality Date  . CHOLECYSTECTOMY N/A 04/21/2013   Procedure: LAPAROSCOPIC CHOLECYSTECTOMY WITH INTRAOPERATIVE CHOLANGIOGRAM;  Surgeon: Haywood Lasso, MD;  Location: [facility];  Service: General;  Laterality: N/A;  . COLONOSCOPY W/ POLYPECTOMY  2004 & 2009    X 2; Dr Fuller Plan; due 2014  . CORONARY ANGIOPLASTY    . CORONARY ARTERY BYPASS GRAFT  04/1999   4 vessels  . CORONARY STENT PLACEMENT  2012   Plavix  . LAPAROSCOPIC CHOLECYSTECTOMY  04/21/2013   Dr Margot Chimes;  acutecholecystitis with necrosis  . VASECTOMY      ROS:  As stated in the HPI and negative for all other systems.  PHYSICAL EXAM BP 112/78   Pulse 62   Ht 5\' 8"  (1.727 m)   Wt 216 lb (98 kg)   BMI 32.84 kg/m  GENERAL:  Well appearing NECK:  No jugular venous distention, waveform within normal limits, carotid upstroke brisk and symmetric, no bruits, no thyromegaly LUNGS:  Clear to auscultation bilaterally BACK:  No CVA tenderness CHEST:  Well healed sternotomy scar. HEART:  PMI not displaced or sustained,S1 and S2 within normal limits, no S3, no S4, no clicks, no rubs, no murmurs ABD:  Flat, positive bowel sounds normal in frequency in pitch, no bruits, no rebound, no guarding, no midline pulsatile mass, no hepatomegaly, no splenomegaly EXT:  2 plus pulses throughout, no edema, no cyanosis no clubbing  Lab Results  Component Value Date   CHOL 181 09/27/2015   TRIG 192 (H) 09/27/2015   HDL 48 09/27/2015   LDLCALC 95 09/27/2015   LDLDIRECT 121.9 10/31/2012    EKG:  Sinus rhythm, rate 62, right bundle branch block, axis within normal limits, intervals within normal limits, no acute ST-T wave changes.  01/27/2016  ASSESSMENT AND PLAN  Coronary atherosclerosis of artery bypass graft -  The patient has no new sypmtoms.  No further cardiovascular testing is indicated.    HYPERLIPIDEMIA -  I reviewed his lipids and his LDL was 95.  He was started on Zetia and is going to have his lipid level rechecked.    HYPERTENSION -  The blood pressure is at target. No change in medications is indicated. We will continue with therapeutic lifestyle changes (TLC).

## 2016-01-27 ENCOUNTER — Encounter: Payer: Self-pay | Admitting: Cardiology

## 2016-01-27 ENCOUNTER — Ambulatory Visit (INDEPENDENT_AMBULATORY_CARE_PROVIDER_SITE_OTHER): Payer: Medicare Other | Admitting: Cardiology

## 2016-01-27 VITALS — BP 112/78 | HR 62 | Ht 68.0 in | Wt 216.0 lb

## 2016-01-27 DIAGNOSIS — I10 Essential (primary) hypertension: Secondary | ICD-10-CM | POA: Diagnosis not present

## 2016-01-27 DIAGNOSIS — I25810 Atherosclerosis of coronary artery bypass graft(s) without angina pectoris: Secondary | ICD-10-CM | POA: Diagnosis not present

## 2016-01-27 MED ORDER — METOPROLOL TARTRATE 25 MG PO TABS: 25.0000 mg | ORAL_TABLET | Freq: Two times a day (BID) | ORAL | 3 refills | Status: DC

## 2016-01-27 NOTE — Patient Instructions (Signed)
Medication Instructions:  Continue current medication  Labwork: None Ordered  Testing/Procedures: None Ordered  Follow-Up: Your physician wants you to follow-up in: 1 Year. You will receive a reminder letter in the mail two months in advance. If you don't receive a letter, please call our office to schedule the follow-up appointment.   Any Other Special Instructions Will Be Listed Below (If Applicable).     If you need a refill on your cardiac medications before your next appointment, please call your pharmacy.   

## 2016-03-30 ENCOUNTER — Encounter: Payer: Self-pay | Admitting: Internal Medicine

## 2016-03-30 ENCOUNTER — Ambulatory Visit (INDEPENDENT_AMBULATORY_CARE_PROVIDER_SITE_OTHER): Payer: Medicare Other | Admitting: Internal Medicine

## 2016-03-30 VITALS — BP 124/68 | HR 65 | Temp 98.2°F | Resp 14 | Ht 68.0 in | Wt 216.2 lb

## 2016-03-30 DIAGNOSIS — Z23 Encounter for immunization: Secondary | ICD-10-CM

## 2016-03-30 DIAGNOSIS — I10 Essential (primary) hypertension: Secondary | ICD-10-CM | POA: Diagnosis not present

## 2016-03-30 DIAGNOSIS — R7303 Prediabetes: Secondary | ICD-10-CM

## 2016-03-30 DIAGNOSIS — I25810 Atherosclerosis of coronary artery bypass graft(s) without angina pectoris: Secondary | ICD-10-CM | POA: Diagnosis not present

## 2016-03-30 DIAGNOSIS — E785 Hyperlipidemia, unspecified: Secondary | ICD-10-CM | POA: Diagnosis not present

## 2016-03-30 LAB — BASIC METABOLIC PANEL
BUN: 10 mg/dL (ref 6–23)
CHLORIDE: 103 meq/L (ref 96–112)
CO2: 26 meq/L (ref 19–32)
Calcium: 9.6 mg/dL (ref 8.4–10.5)
Creatinine, Ser: 0.89 mg/dL (ref 0.40–1.50)
GFR: 90.27 mL/min (ref >60.00)
Glucose, Bld: 118 mg/dL — ABNORMAL HIGH (ref 70–99)
Potassium: 4.3 mEq/L (ref 3.5–5.1)
SODIUM: 137 meq/L (ref 135–145)

## 2016-03-30 LAB — LIPID PANEL
CHOL/HDL RATIO: 3
Cholesterol: 163 mg/dL (ref 0–200)
HDL: 50 mg/dL (ref >39.00)
NONHDL: 113.26
TRIGLYCERIDES: 224 mg/dL — AB (ref 0.0–149.0)
VLDL: 44.8 mg/dL — ABNORMAL HIGH (ref 0.0–40.0)

## 2016-03-30 LAB — LDL CHOLESTEROL, DIRECT: Direct LDL: 91 mg/dL

## 2016-03-30 LAB — HEMOGLOBIN A1C: Hgb A1c MFr Bld: 6.6 % — ABNORMAL HIGH (ref 4.6–6.5)

## 2016-03-30 MED ORDER — VALACYCLOVIR HCL 500 MG PO TABS: 500.0000 mg | ORAL_TABLET | ORAL | 5 refills | Status: DC | PRN

## 2016-03-30 NOTE — Patient Instructions (Signed)
GO TO THE LAB : Get the blood work     GO TO THE FRONT DESK Schedule your next appointment for a  routine checkup in 5-6 months       Check the  blood pressure 2 or 3 times a month   Be sure your blood pressure is between 110/65 and  135/85. If it is consistently higher or lower, let me know

## 2016-03-30 NOTE — Progress Notes (Signed)
Pre visit review using our clinic review tool, if applicable. No additional management support is needed unless otherwise documented below in the visit note. 

## 2016-03-30 NOTE — Assessment & Plan Note (Signed)
Diabetes: Diet control, check A1c. HTN: Continue Altace, Lopressor and amlodipine. Check a BMP. CBC satisfactory High cholesterol: On Crestor and niacin, Zetia was added 09/2015. Will check a FLP, LFTs within normal in the past. Primary care: Flu shot today, recommend to stay active. RTC 5-6 months

## 2016-03-30 NOTE — Progress Notes (Addendum)
Subjective:    Patient ID: Kevin Webb, male    DOB: [birth year], 70 y.o.   MRN: [id number]  DOS:  03/30/2016 Type of visit - description :  Routine checkup Interval history: DM: Diet control, he remains very active at work. Blood sugars in the morning 100, 115. Sometimes after eating 140. HTN: Good med compliance, ambulatory BPs 120/80, 130/80. High cholesterol: Zetia was added, due for labs CAD: Note from cardiology reviewed, he was felt to be stable.  Review of Systems Denies chest pain, orthopnea or edema Denies difficulty breathing or dyspnea on exertion.   Past Medical History:  Diagnosis Date  . Acute cholecystitis 04/18/2013  . Adenomatous colon polyp 12/2004  . CAD (coronary artery disease)    a.S/P CABG 12/00. b. cath 3/12: L-LAD ok, S-RI 95% tx with BMS/thrombectomy, S-AM-RCA ok, EF 60%.  . Diverticulitis 2008/2009  . GERD (gastroesophageal reflux disease)   . HTN (hypertension)   . Hyperglycemia   . Hyperlipidemia   . Impingement syndrome of left shoulder 2016  . Low testosterone    Dr [name], [place] ,[place]  . Osteoarthritis of right knee    With trace effusion  . Peyronie disease   . RBBB   . RLS (restless legs syndrome) 08/15/2013  . Sinus bradycardia   . Vitamin D deficiency     Past Surgical History:  Procedure Laterality Date  . CHOLECYSTECTOMY N/A 04/21/2013   Procedure: LAPAROSCOPIC CHOLECYSTECTOMY WITH INTRAOPERATIVE CHOLANGIOGRAM;  Surgeon: Haywood Lasso, MD;  Location: [facility];  Service: General;  Laterality: N/A;  . COLONOSCOPY W/ POLYPECTOMY  2004 & 2009    X 2; Dr Fuller Plan; due 2014  . CORONARY ANGIOPLASTY    . CORONARY ARTERY BYPASS GRAFT  04/1999   4 vessels  . CORONARY STENT PLACEMENT  2012   Plavix  . LAPAROSCOPIC CHOLECYSTECTOMY  04/21/2013   Dr Margot Chimes; acutecholecystitis with necrosis  . VASECTOMY      Social History   Social History  . Marital status: Single    Spouse name: N/A  . Number of children: 1  . Years of education:  N/A   Occupational History  . plumber      plumber   Social History Main Topics  . Smoking status: Never Smoker  . Smokeless tobacco: Never Used  . Alcohol use 6.0 - 7.2 oz/week    10 - 12 Cans of beer per week     Comment: socially   . Drug use: No  . Sexual activity: Not on file   Other Topics Concern  . Not on file   Social History Narrative   Remarried 2014        Medication List       Accurate as of 03/30/16  2:08 PM. Always use your most recent med list.          amLODipine 5 MG tablet Commonly known as:  NORVASC Take 1 tablet (5 mg total) by mouth daily.   aspirin 81 MG tablet Take 81 mg by mouth daily.   co-enzyme Q-10 30 MG capsule Take 30 mg by mouth daily.   ezetimibe 10 MG tablet Commonly known as:  ZETIA Take 1 tablet (10 mg total) by mouth daily.   Fish Oil 1000 MG Caps 1 tab po qd   GRAPE SEED PO Take 1 capsule by mouth daily. Reported on 09/26/2015   metoprolol tartrate 25 MG tablet Commonly known as:  LOPRESSOR Take 1 tablet (25 mg total) by mouth 2 (two)  times daily.   multivitamin tablet Take 1 tablet by mouth daily. Reported on 09/26/2015   niacin 1000 MG CR tablet Commonly known as:  NIASPAN Take 1 tablet (1,000 mg total) by mouth at bedtime.   nitroGLYCERIN 0.4 MG SL tablet Commonly known as:  NITROSTAT Place 1 tablet (0.4 mg total) under the tongue every 5 (five) minutes as needed.   pantoprazole 40 MG tablet Commonly known as:  PROTONIX Take 1 tablet (40 mg total) by mouth daily.   ramipril 10 MG capsule Commonly known as:  ALTACE Take 1 capsule (10 mg total) by mouth daily.   rosuvastatin 40 MG tablet Commonly known as:  CRESTOR Take 1 tablet (40 mg total) by mouth daily.   SAW PALMETTO PO Take 1 capsule by mouth daily.   SELENIUM PO Take 1 tablet by mouth daily. Reported on 09/26/2015   sildenafil 25 MG tablet Commonly known as:  VIAGRA Take 50 mg by mouth daily as needed for erectile dysfunction (per  urology). Reported on 09/26/2015   valACYclovir 500 MG tablet Commonly known as:  VALTREX Take 1 tablet (500 mg total) by mouth as needed.   vitamin C 1000 MG tablet Take 1,000 mg by mouth daily. Reported on 09/26/2015   Vitamin D3 5000 units Caps Take by mouth daily.   vitamin E 100 UNIT capsule Take 100 Units by mouth daily.          Objective:   Physical Exam BP 124/68 (BP Location: Left Arm, Patient Position: Sitting, Cuff Size: Normal)   Pulse 65   Temp 98.2 F (36.8 C) (Oral)   Resp 14   Ht 5\' 8"  (1.727 m)   Wt 216 lb 4 oz (98.1 kg)   SpO2 97%   BMI 32.88 kg/m  General:   Well developed, well nourished . NAD.  HEENT:  Normocephalic . Face symmetric, atraumatic Lungs:  CTA B Normal respiratory effort, no intercostal retractions, no accessory muscle use. Heart: RRR,  no murmur.  No pretibial edema bilaterally  Skin: Not pale. Not jaundice Neurologic:  alert & oriented X3.  Speech normal, gait appropriate for age and unassisted Psych--  Cognition and judgment appear intact.  Cooperative with normal attention span and concentration.  Behavior appropriate. No anxious or depressed appearing.      Assessment & Plan:    Assessment > Diabetes HTN   on amlodipine since 01-2015 Hyperlipidemia DJD Low testosterone Dr. Francene Boyers  GERD CV: CAD  CABG 2000, cath 2012 RBBB RLS  vitamin D deficiency Peroyne's disease Sees dermatology   PLAN: Diabetes: Diet control, check A1c. HTN: Continue Altace, Lopressor and amlodipine. Check a BMP. CBC satisfactory High cholesterol: On Crestor and niacin, Zetia was added 09/2015. Will check a FLP, LFTs within normal in the past. Primary care: Flu shot today, recommend to stay active. RTC 5-6 months

## 2016-03-31 ENCOUNTER — Other Ambulatory Visit: Payer: Self-pay

## 2016-03-31 MED ORDER — AMLODIPINE BESYLATE 5 MG PO TABS: 5.0000 mg | ORAL_TABLET | Freq: Every day | ORAL | 3 refills | Status: DC

## 2016-04-02 DIAGNOSIS — E291 Testicular hypofunction: Secondary | ICD-10-CM | POA: Diagnosis not present

## 2016-04-02 DIAGNOSIS — N401 Enlarged prostate with lower urinary tract symptoms: Secondary | ICD-10-CM | POA: Diagnosis not present

## 2016-04-02 DIAGNOSIS — R350 Frequency of micturition: Secondary | ICD-10-CM | POA: Diagnosis not present

## 2016-04-02 DIAGNOSIS — N5201 Erectile dysfunction due to arterial insufficiency: Secondary | ICD-10-CM | POA: Diagnosis not present

## 2016-04-03 DIAGNOSIS — E291 Testicular hypofunction: Secondary | ICD-10-CM | POA: Diagnosis not present

## 2016-04-03 DIAGNOSIS — Z79899 Other long term (current) drug therapy: Secondary | ICD-10-CM | POA: Diagnosis not present

## 2016-04-03 DIAGNOSIS — N401 Enlarged prostate with lower urinary tract symptoms: Secondary | ICD-10-CM | POA: Diagnosis not present

## 2016-04-20 ENCOUNTER — Other Ambulatory Visit: Payer: Self-pay

## 2016-04-20 DIAGNOSIS — E785 Hyperlipidemia, unspecified: Secondary | ICD-10-CM

## 2016-04-20 MED ORDER — ROSUVASTATIN CALCIUM 40 MG PO TABS: 40.0000 mg | ORAL_TABLET | Freq: Every day | ORAL | 1 refills | Status: DC

## 2016-04-24 ENCOUNTER — Other Ambulatory Visit: Payer: Self-pay

## 2016-05-04 ENCOUNTER — Other Ambulatory Visit: Payer: Self-pay

## 2016-05-04 MED ORDER — NIACIN ER (ANTIHYPERLIPIDEMIC) 1000 MG PO TBCR: 1000.0000 mg | EXTENDED_RELEASE_TABLET | Freq: Every day | ORAL | 1 refills | Status: DC

## 2016-05-04 MED ORDER — PANTOPRAZOLE SODIUM 40 MG PO TBEC: 40.0000 mg | DELAYED_RELEASE_TABLET | Freq: Every day | ORAL | 1 refills | Status: DC

## 2016-05-27 DIAGNOSIS — N401 Enlarged prostate with lower urinary tract symptoms: Secondary | ICD-10-CM | POA: Diagnosis not present

## 2016-05-27 DIAGNOSIS — E291 Testicular hypofunction: Secondary | ICD-10-CM | POA: Diagnosis not present

## 2016-05-27 DIAGNOSIS — N5201 Erectile dysfunction due to arterial insufficiency: Secondary | ICD-10-CM | POA: Diagnosis not present

## 2016-06-13 ENCOUNTER — Other Ambulatory Visit: Payer: Self-pay | Admitting: Internal Medicine

## 2016-07-07 ENCOUNTER — Other Ambulatory Visit: Payer: Self-pay

## 2016-07-07 MED ORDER — RAMIPRIL 10 MG PO CAPS: 10.0000 mg | ORAL_CAPSULE | Freq: Every day | ORAL | 1 refills | Status: DC

## 2016-08-18 ENCOUNTER — Telehealth: Payer: Self-pay | Admitting: Internal Medicine

## 2016-08-18 MED ORDER — NITROGLYCERIN 0.4 MG SL SUBL: 0.4000 mg | SUBLINGUAL_TABLET | SUBLINGUAL | 5 refills | Status: DC | PRN

## 2016-08-18 NOTE — Telephone Encounter (Signed)
Rx sent 

## 2016-08-18 NOTE — Telephone Encounter (Signed)
Caller name: Shae with  [place], [place] Can be reached: [phone] Pharmacy: [facility], [place]  Reason for call: Pt needing refill on nitroglycerin tablets. Pt has expired meds on hand. Please send in for pt.

## 2016-08-19 DIAGNOSIS — R1032 Left lower quadrant pain: Secondary | ICD-10-CM | POA: Diagnosis not present

## 2016-09-25 NOTE — Progress Notes (Signed)
Subjective:   Kevin Webb is a 71 y.o. male who presents for Medicare Annual/Subsequent preventive examination.  Review of Systems:  No ROS.  Medicare Wellness Visit.  Cardiac Risk Factors include: advanced age (>57men, >29 women);hypertension;dyslipidemia;obesity (BMI >30kg/m2);sedentary lifestyle Sleep patterns: Sleeps about 4-5 hrs per night. States this is his norm. Feels rested. Home Safety/Smoke Alarms: Feels safe in home. Smoke alarms in place.   Living environment; residence and Firearm Safety: Lives with wife.Guns safely stored. Seat Belt Safety/Bike Helmet: Wears seat belt.   Counseling:   Eye Exam- Wears reading glasses.  Dental- Dr.Patterson every 6 months.  Male:   CCS- Last 05/02/15: Polyp removed was NOT precancerous per report letter. Repeat in 5 yrs. PSA-  Lab Results  Component Value Date   PSA 1.35 10/31/2012   PSA 1.31 02/15/2012   PSA 1.07 12/23/2007       Objective:    Vitals: BP 128/78 (BP Location: Left Arm, Patient Position: Sitting, Cuff Size: Normal)   Pulse 68   Temp 97.7 F (36.5 C) (Oral)   Resp 14   Ht 5\' 8"  (1.727 m)   Wt 214 lb (97.1 kg)   SpO2 98%   BMI 32.54 kg/m   Body mass index is 32.54 kg/m.  Tobacco History  Smoking Status  . Never Smoker  Smokeless Tobacco  . Never Used     Counseling given: No   Past Medical History:  Diagnosis Date  . Acute cholecystitis 04/18/2013  . Adenomatous colon polyp 12/2004  . CAD (coronary artery disease)    a.S/P CABG 12/00. b. cath 3/12: L-LAD ok, S-RI 95% tx with BMS/thrombectomy, S-AM-RCA ok, EF 60%.  . Diabetes mellitus without complication ([facility]) 0/11/6224  . Diverticulitis 2008/2009  . GERD (gastroesophageal reflux disease)   . HTN (hypertension)   . Hyperglycemia   . Hyperlipidemia   . Impingement syndrome of left shoulder 2016  . Low testosterone    Dr [name], [place] ,[place]  . Osteoarthritis of right knee    With trace effusion  . Peyronie disease   . RBBB   . RLS  (restless legs syndrome) 08/15/2013  . Sinus bradycardia   . Vitamin D deficiency    Past Surgical History:  Procedure Laterality Date  . CHOLECYSTECTOMY N/A 04/21/2013   Procedure: LAPAROSCOPIC CHOLECYSTECTOMY WITH INTRAOPERATIVE CHOLANGIOGRAM;  Surgeon: Haywood Lasso, MD;  Location: [facility];  Service: General;  Laterality: N/A;  . COLONOSCOPY W/ POLYPECTOMY  2004 & 2009    X 2; Dr Fuller Plan; due 2014  . CORONARY ANGIOPLASTY    . CORONARY ARTERY BYPASS GRAFT  04/1999   4 vessels  . CORONARY STENT PLACEMENT  2012   Plavix  . LAPAROSCOPIC CHOLECYSTECTOMY  04/21/2013   Dr Margot Chimes; acutecholecystitis with necrosis  . VASECTOMY     Family History  Problem Relation Age of Onset  . Aortic aneurysm Father 13       ? abdominal  . Heart attack Father 78  . Diabetes Unknown        PGaunt  . Coronary artery disease Mother        CABG in 29s  . CVA Mother        cns bleed from warfarin  . Diabetes Brother   . Cholecystitis Sister   . Cancer Neg Hx   . Colon cancer Neg Hx   . Prostate cancer Neg Hx    History  Sexual Activity  . Sexual activity: Not on file    Outpatient Encounter  Prescriptions as of 09/28/2016  Medication Sig  . amLODipine (NORVASC) 5 MG tablet Take 1 tablet (5 mg total) by mouth daily.  . Ascorbic Acid (VITAMIN C) 1000 MG tablet Take 1,000 mg by mouth daily. Reported on 09/26/2015  . aspirin 81 MG tablet Take 81 mg by mouth daily.  Marland Kitchen Bioflavonoid Products (GRAPE SEED PO) Take 1 capsule by mouth daily. Reported on 09/26/2015  . Cholecalciferol (VITAMIN D3) 5000 UNITS CAPS Take by mouth daily.  Marland Kitchen co-enzyme Q-10 30 MG capsule Take 30 mg by mouth daily.   Marland Kitchen ezetimibe (ZETIA) 10 MG tablet Take 1 tablet (10 mg total) by mouth daily.  . metoprolol tartrate (LOPRESSOR) 25 MG tablet Take 1 tablet (25 mg total) by mouth 2 (two) times daily.  . Multiple Vitamin (MULTIVITAMIN) tablet Take 1 tablet by mouth daily. Reported on 09/26/2015  . Omega-3 Fatty Acids (FISH OIL) 1000 MG  CAPS 1 tab po qd   . pantoprazole (PROTONIX) 40 MG tablet Take 1 tablet (40 mg total) by mouth daily.  . ramipril (ALTACE) 10 MG capsule Take 1 capsule (10 mg total) by mouth daily.  . rosuvastatin (CRESTOR) 40 MG tablet Take 1 tablet (40 mg total) by mouth daily.  . Saw Palmetto, Serenoa repens, (SAW PALMETTO PO) Take 1 capsule by mouth daily.  . SELENIUM PO Take 1 tablet by mouth daily. Reported on 09/26/2015  . sildenafil (VIAGRA) 25 MG tablet Take 50 mg by mouth daily as needed for erectile dysfunction (per urology). Reported on 09/26/2015  . valACYclovir (VALTREX) 500 MG tablet Take 1 tablet (500 mg total) by mouth as needed.  . vitamin E 100 UNIT capsule Take 100 Units by mouth daily.    . [DISCONTINUED] niacin (NIASPAN) 1000 MG CR tablet Take 1 tablet (1,000 mg total) by mouth at bedtime.  . gabapentin (NEURONTIN) 100 MG capsule Take 1-2 capsules (100-200 mg total) by mouth at bedtime as needed (RLS).  . nitroGLYCERIN (NITROSTAT) 0.4 MG SL tablet Place 1 tablet (0.4 mg total) under the tongue every 5 (five) minutes x 3 doses as needed.  . [DISCONTINUED] nitroGLYCERIN (NITROSTAT) 0.4 MG SL tablet Place 1 tablet (0.4 mg total) under the tongue every 5 (five) minutes as needed. No more than 3 tablets per episode. (Patient not taking: Reported on 09/28/2016)   No facility-administered encounter medications on file as of 09/28/2016.     Activities of Daily Living In your present state of health, do you have any difficulty performing the following activities: 09/28/2016 03/30/2016  Hearing? N N  Vision? N N  Difficulty concentrating or making decisions? N N  Walking or climbing stairs? N N  Dressing or bathing? N N  Doing errands, shopping? N N  Preparing Food and eating ? N -  Using the Toilet? N -  In the past six months, have you accidently leaked urine? N -  Do you have problems with loss of bowel control? N -  Managing your Medications? N -  Managing your Finances? N -  Housekeeping  or managing your Housekeeping? N -  Some recent data might be hidden    Patient Care Team: Colon Branch, MD as PCP - General (Internal Medicine) Minus Breeding, MD as Consulting Physician (Cardiology) Royston Cowper, MD as Consulting Physician (Urology) Ladene Artist, MD as Consulting Physician (Gastroenterology)   Assessment:    Physical assessment deferred to PCP.  Exercise Activities and Dietary recommendations Current Exercise Habits: The patient does not participate in regular exercise  at present, Exercise limited by: orthopedic condition(s)   Diet (meal preparation, eat out, water intake, caffeinated beverages, dairy products, fruits and vegetables): in general, a "healthy" diet     Goals      Patient Stated   . <enter goal here> (pt-stated)          Maintain healthy lifestyle       Fall Risk Fall Risk  09/28/2016 04/24/2016 03/30/2016 01/25/2015 10/25/2014  Falls in the past year? Yes No No No No  Number falls in past yr: 1 - - - -  Risk for fall due to : History of fall(s) - - - -  Follow up Education provided;Falls prevention discussed - - - -   Depression Screen PHQ 2/9 Scores 09/28/2016 03/30/2016 01/25/2015 10/25/2014  PHQ - 2 Score 0 0 0 0    Cognitive Function Ad8 score reviewed for issues:  Issues making decisions:no  Less interest in hobbies / activities:no  Repeats questions, stories (family complaining):no  Trouble using ordinary gadgets (microwave, computer, phone):no  Forgets the month or year: no  Mismanaging finances: no  Remembering appts:no  Daily problems with thinking and/or memory:no Ad8 score is=0        Immunization History  Administered Date(s) Administered  . Influenza, High Dose Seasonal PF 01/25/2015, 03/30/2016  . Influenza,inj,Quad PF,36+ Mos 04/25/2014  . Pneumococcal Conjugate-13 09/27/2015  . Pneumococcal Polysaccharide-23 04/25/2014  . Td 08/07/2008  . Zoster 03/21/2014   Screening Tests Health Maintenance    Topic Date Due  . Hepatitis C Screening  1946-01-30  . FOOT EXAM  12/11/1957  . OPHTHALMOLOGY EXAM  12/11/1957  . HEMOGLOBIN A1C  09/27/2016  . INFLUENZA VACCINE  12/16/2016  . TETANUS/TDAP  08/08/2018  . COLONOSCOPY  05/01/2020  . PNA vac Low Risk Adult  Completed      Plan:     Follow up with PCP as directed.  Continue to eat heart healthy diet (full of fruits, vegetables, whole grains, lean protein, water--limit salt, fat, and sugar intake) and increase physical activity as tolerated.   I have personally reviewed and noted the following in the patient's chart:   . Medical and social history . Use of alcohol, tobacco or illicit drugs  . Current medications and supplements . Functional ability and status . Nutritional status . Physical activity . Advanced directives . List of other physicians . Hospitalizations, surgeries, and ER visits in previous 12 months . Vitals . Screenings to include cognitive, depression, and falls . Referrals and appointments  In addition, I have reviewed and discussed with patient certain preventive protocols, quality metrics, and best practice recommendations. A written personalized care plan for preventive services as well as general preventive health recommendations were provided to patient.     Naaman Plummer [place], [place]  09/28/2016  Kathlene November, MD

## 2016-09-28 ENCOUNTER — Encounter: Payer: Self-pay | Admitting: Internal Medicine

## 2016-09-28 ENCOUNTER — Ambulatory Visit (INDEPENDENT_AMBULATORY_CARE_PROVIDER_SITE_OTHER): Payer: Medicare Other | Admitting: Internal Medicine

## 2016-09-28 VITALS — BP 128/78 | HR 68 | Temp 97.7°F | Resp 14 | Ht 68.0 in | Wt 214.0 lb

## 2016-09-28 DIAGNOSIS — E119 Type 2 diabetes mellitus without complications: Secondary | ICD-10-CM

## 2016-09-28 DIAGNOSIS — E118 Type 2 diabetes mellitus with unspecified complications: Secondary | ICD-10-CM | POA: Diagnosis not present

## 2016-09-28 DIAGNOSIS — I10 Essential (primary) hypertension: Secondary | ICD-10-CM | POA: Diagnosis not present

## 2016-09-28 DIAGNOSIS — Z1159 Encounter for screening for other viral diseases: Secondary | ICD-10-CM

## 2016-09-28 DIAGNOSIS — G2581 Restless legs syndrome: Secondary | ICD-10-CM | POA: Diagnosis not present

## 2016-09-28 DIAGNOSIS — E785 Hyperlipidemia, unspecified: Secondary | ICD-10-CM | POA: Diagnosis not present

## 2016-09-28 DIAGNOSIS — Z0000 Encounter for general adult medical examination without abnormal findings: Secondary | ICD-10-CM

## 2016-09-28 HISTORY — DX: Type 2 diabetes mellitus without complications: E11.9

## 2016-09-28 LAB — COMPREHENSIVE METABOLIC PANEL
ALBUMIN: 4.7 g/dL (ref 3.5–5.2)
ALT: 25 U/L (ref 0–53)
AST: 29 U/L (ref 0–37)
Alkaline Phosphatase: 45 U/L (ref 39–117)
BUN: 11 mg/dL (ref 6–23)
CALCIUM: 9.7 mg/dL (ref 8.4–10.5)
CHLORIDE: 101 meq/L (ref 96–112)
CO2: 27 meq/L (ref 19–32)
Creatinine, Ser: 0.93 mg/dL (ref 0.40–1.50)
GFR: 85.68 mL/min (ref >60.00)
Glucose, Bld: 113 mg/dL — ABNORMAL HIGH (ref 70–99)
POTASSIUM: 4.2 meq/L (ref 3.5–5.1)
Sodium: 137 mEq/L (ref 135–145)
Total Bilirubin: 1.2 mg/dL (ref 0.2–1.2)
Total Protein: 7.4 g/dL (ref 6.0–8.3)

## 2016-09-28 LAB — CBC WITH DIFFERENTIAL/PLATELET
BASOS PCT: 0.2 % (ref 0.0–3.0)
Basophils Absolute: 0 10*3/uL (ref 0.0–0.1)
EOS PCT: 0.6 % (ref 0.0–5.0)
Eosinophils Absolute: 0 10*3/uL (ref 0.0–0.7)
HEMATOCRIT: 46.4 % (ref 39.0–52.0)
Hemoglobin: 15.7 g/dL (ref 13.0–17.0)
LYMPHS PCT: 24.1 % (ref 12.0–46.0)
Lymphs Abs: 1.6 10*3/uL (ref 0.7–4.0)
MCHC: 33.8 g/dL (ref 30.0–36.0)
MCV: 89.6 fl (ref 78.0–100.0)
MONOS PCT: 7.6 % (ref 3.0–12.0)
Monocytes Absolute: 0.5 10*3/uL (ref 0.1–1.0)
NEUTROS ABS: 4.6 10*3/uL (ref 1.4–7.7)
Neutrophils Relative %: 67.5 % (ref 43.0–77.0)
PLATELETS: 156 10*3/uL (ref 150.0–400.0)
RBC: 5.18 Mil/uL (ref 4.22–5.81)
RDW: 13.9 % (ref 11.5–15.5)
WBC: 6.8 10*3/uL (ref 4.0–10.5)

## 2016-09-28 LAB — HEMOGLOBIN A1C: Hgb A1c MFr Bld: 7 % — ABNORMAL HIGH (ref 4.6–6.5)

## 2016-09-28 MED ORDER — NITROGLYCERIN 0.4 MG SL SUBL: 0.4000 mg | SUBLINGUAL_TABLET | SUBLINGUAL | 5 refills | Status: DC | PRN

## 2016-09-28 MED ORDER — GABAPENTIN 100 MG PO CAPS: 100.0000 mg | ORAL_CAPSULE | Freq: Every evening | ORAL | 6 refills | Status: DC | PRN

## 2016-09-28 NOTE — Progress Notes (Signed)
Subjective:    Patient ID: Kevin Webb, male    DOB: November 15, 1945, 71 y.o.   MRN: [id number]  DOS:  09/28/2016 Type of visit - description : rov Interval history: DM: Diet control, ambulatory CBGs always less than 133 when checked. HTN: Ambulatory BPs are very good. Never more than 130 RLS, currently untreated, has a discomfort in the legs only at night on and off, it helps by "walking it off". CAD: Needs nitroglycerin refill. Have an episode of abdominal pain,  Went to a UC @ [place] , dx w/ with diverticulitis, quickly resolved with Cipro and Flagyl. Now asymptomatic  Review of Systems  Denies chest pain, difficulty breathing or lower extremity edema No nausea, vomiting, diarrhea. No blood in the stools   Past Medical History:  Diagnosis Date  . Acute cholecystitis 04/18/2013  . Adenomatous colon polyp 12/2004  . CAD (coronary artery disease)    a.S/P CABG 12/00. b. cath 3/12: L-LAD ok, S-RI 95% tx with BMS/thrombectomy, S-AM-RCA ok, EF 60%.  . Diabetes mellitus without complication ([place]) 3/55/7322  . Diverticulitis 2008/2009  . GERD (gastroesophageal reflux disease)   . HTN (hypertension)   . Hyperglycemia   . Hyperlipidemia   . Impingement syndrome of left shoulder 2016  . Low testosterone    Dr [name], [place] ,[place]  . Osteoarthritis of right knee    With trace effusion  . Peyronie disease   . RBBB   . RLS (restless legs syndrome) 08/15/2013  . Sinus bradycardia   . Vitamin D deficiency     Past Surgical History:  Procedure Laterality Date  . CHOLECYSTECTOMY N/A 04/21/2013   Procedure: LAPAROSCOPIC CHOLECYSTECTOMY WITH INTRAOPERATIVE CHOLANGIOGRAM;  Surgeon: Haywood Lasso, MD;  Location: [facility];  Service: General;  Laterality: N/A;  . COLONOSCOPY W/ POLYPECTOMY  2004 & 2009    X 2; Dr Fuller Plan; due 2014  . CORONARY ANGIOPLASTY    . CORONARY ARTERY BYPASS GRAFT  04/1999   4 vessels  . CORONARY STENT PLACEMENT  2012   Plavix  . LAPAROSCOPIC CHOLECYSTECTOMY   04/21/2013   Dr Margot Chimes; acutecholecystitis with necrosis  . VASECTOMY      Social History   Social History  . Marital status: Single    Spouse name: N/A  . Number of children: 1  . Years of education: N/A   Occupational History  . plumber      plumber   Social History Main Topics  . Smoking status: Never Smoker  . Smokeless tobacco: Never Used  . Alcohol use 6.0 - 7.2 oz/week    10 - 12 Cans of beer per week     Comment: socially   . Drug use: No  . Sexual activity: Not on file   Other Topics Concern  . Not on file   Social History Narrative   Remarried 2014      Allergies as of 09/28/2016      Reactions   Oxycodone-acetaminophen    REACTION: RASH - tolerated morphine Note : Taking oxycodone/APAP 5-325 postoperatively 12/14 without issue   Darvocet [propoxyphene N-acetaminophen]    Propoxyphene Hives      Medication List       Accurate as of 09/28/16 11:59 PM. Always use your most recent med list.          amLODipine 5 MG tablet Commonly known as:  NORVASC Take 1 tablet (5 mg total) by mouth daily.   aspirin 81 MG tablet Take 81 mg by mouth daily.  co-enzyme Q-10 30 MG capsule Take 30 mg by mouth daily.   ezetimibe 10 MG tablet Commonly known as:  ZETIA Take 1 tablet (10 mg total) by mouth daily.   Fish Oil 1000 MG Caps 1 tab po qd   gabapentin 100 MG capsule Commonly known as:  NEURONTIN Take 1-2 capsules (100-200 mg total) by mouth at bedtime as needed (RLS).   GRAPE SEED PO Take 1 capsule by mouth daily. Reported on 09/26/2015   metoprolol tartrate 25 MG tablet Commonly known as:  LOPRESSOR Take 1 tablet (25 mg total) by mouth 2 (two) times daily.   multivitamin tablet Take 1 tablet by mouth daily. Reported on 09/26/2015   nitroGLYCERIN 0.4 MG SL tablet Commonly known as:  NITROSTAT Place 1 tablet (0.4 mg total) under the tongue every 5 (five) minutes x 3 doses as needed.   pantoprazole 40 MG tablet Commonly known as:   PROTONIX Take 1 tablet (40 mg total) by mouth daily.   ramipril 10 MG capsule Commonly known as:  ALTACE Take 1 capsule (10 mg total) by mouth daily.   rosuvastatin 40 MG tablet Commonly known as:  CRESTOR Take 1 tablet (40 mg total) by mouth daily.   SAW PALMETTO PO Take 1 capsule by mouth daily.   SELENIUM PO Take 1 tablet by mouth daily. Reported on 09/26/2015   sildenafil 25 MG tablet Commonly known as:  VIAGRA Take 50 mg by mouth daily as needed for erectile dysfunction (per urology). Reported on 09/26/2015   valACYclovir 500 MG tablet Commonly known as:  VALTREX Take 1 tablet (500 mg total) by mouth as needed.   vitamin C 1000 MG tablet Take 1,000 mg by mouth daily. Reported on 09/26/2015   Vitamin D3 5000 units Caps Take by mouth daily.   vitamin E 100 UNIT capsule Take 100 Units by mouth daily.          Objective:   Physical Exam BP 128/78 (BP Location: Left Arm, Patient Position: Sitting, Cuff Size: Normal)   Pulse 68   Temp 97.7 F (36.5 C) (Oral)   Resp 14   Ht 5\' 8"  (1.727 m)   Wt 214 lb (97.1 kg)   SpO2 98%   BMI 32.54 kg/m  General:   Well developed, well nourished . NAD.  HEENT:  Normocephalic . Face symmetric, atraumatic Lungs:  CTA B Normal respiratory effort, no intercostal retractions, no accessory muscle use. Heart: RRR,  no murmur.  No pretibial edema bilaterally  Skin: Not pale. Not jaundice Neurologic:  alert & oriented X3.  Speech normal, gait appropriate for age and unassisted Psych--  Cognition and judgment appear intact.  Cooperative with normal attention span and concentration.  Behavior appropriate. No anxious or depressed appearing.      Assessment & Plan:    Assessment > Diabetes HTN   on amlodipine since 01-2015 Hyperlipidemia DJD Low testosterone Dr. Francene Boyers [facility] G[facility] CV: CAD  CABG 2000, cath 2012 RBBB RLS  vitamin D deficiency Peroyne's disease Sees dermatology   PLAN: DM: Diet control, check  A1c HTN: Good med compliance, check a CMP and CBC. Ambulatory BPs very good. Hyperlipidemia: Continue Zetia and Crestor. Stop Niaspan, recheck labs on RTC CAD: Asx, plan is to control CV RF, RF NTG. RLS: Currently untreated, previously was prescribed clonazepam but he was somewhat reluctant to take as it is a controlled substance. Rec a trial with gabapentin 100 mg one or 2 tablets every night. Sees urology regularly, reports he is  getting HRT from them but plans to stop HRT. It does help him feel better but he simply does not like to continue with it. Have his Medicare wellness today.. RTC 6 months

## 2016-09-28 NOTE — Patient Instructions (Addendum)
GO TO THE LAB : Get the blood work     GO TO THE FRONT DESK Schedule your next appointment for a  checkup in 6 months, fasting  Stop taking Niaspan  Take gabapentin 100 mg: One or 2 tablets 2 hours before bedtime to help RLS.    Kevin Webb , Thank you for taking time to come for your Medicare Wellness Visit. I appreciate your ongoing commitment to your health goals. Please review the following plan we discussed and let me know if I can assist you in the future.   These are the goals we discussed: Goals      Patient Stated   . <enter goal here> (pt-stated)          Maintain healthy lifestyle        This is a list of the screening recommended for you and due dates:  Health Maintenance  Topic Date Due  .  Hepatitis C: One time screening is recommended by Center for Disease Control  (CDC) for  adults born from [birth year] through 1963.   1945-12-27  . Complete foot exam   12/11/1957  . Eye exam for diabetics  12/11/1957  . Hemoglobin A1C  09/27/2016  . Flu Shot  12/16/2016  . Tetanus Vaccine  08/08/2018  . Colon Cancer Screening  05/01/2020  . Pneumonia vaccines  Completed      Health Maintenance, Male A healthy lifestyle and preventive care is important for your health and wellness. Ask your health care provider about what schedule of regular examinations is right for you. What should I know about weight and diet?  Eat a Healthy Diet  Eat plenty of vegetables, fruits, whole grains, low-fat dairy products, and lean protein.  Do not eat a lot of foods high in solid fats, added sugars, or salt. Maintain a Healthy Weight  Regular exercise can help you achieve or maintain a healthy weight. You should:  Do at least 150 minutes of exercise each week. The exercise should increase your heart rate and make you sweat (moderate-intensity exercise).  Do strength-training exercises at least twice a week. Watch Your Levels of Cholesterol and Blood Lipids  Have your blood tested for  lipids and cholesterol every 5 years starting at 69 years of age. If you are at high risk for heart disease, you should start having your blood tested when you are 69 years old. You may need to have your cholesterol levels checked more often if:  Your lipid or cholesterol levels are high.  You are older than 69 years of age.  You are at high risk for heart disease. What should I know about cancer screening? Many types of cancers can be detected early and may often be prevented. Lung Cancer  You should be screened every year for lung cancer if:  You are a current smoker who has smoked for at least 30 years.  You are a former smoker who has quit within the past 15 years.  Talk to your health care provider about your screening options, when you should start screening, and how often you should be screened. Colorectal Cancer  Routine colorectal cancer screening usually begins at 69 years of age and should be repeated every 5-10 years until you are 69 years old. You may need to be screened more often if early forms of precancerous polyps or small growths are found. Your health care provider may recommend screening at an earlier age if you have risk factors for colon cancer.  Your  health care provider may recommend using home test kits to check for hidden blood in the stool.  A small camera at the end of a tube can be used to examine your colon (sigmoidoscopy or colonoscopy). This checks for the earliest forms of colorectal cancer. Prostate and Testicular Cancer  Depending on your age and overall health, your health care provider may do certain tests to screen for prostate and testicular cancer.  Talk to your health care provider about any symptoms or concerns you have about testicular or prostate cancer. Skin Cancer  Check your skin from head to toe regularly.  Tell your health care provider about any new moles or changes in moles, especially if:  There is a change in a mole's size,  shape, or color.  You have a mole that is larger than a pencil eraser.  Always use sunscreen. Apply sunscreen liberally and repeat throughout the day.  Protect yourself by wearing long sleeves, pants, a wide-brimmed hat, and sunglasses when outside. What should I know about heart disease, diabetes, and high blood pressure?  If you are 49-72 years of age, have your blood pressure checked every 3-5 years. If you are 57 years of age or older, have your blood pressure checked every year. You should have your blood pressure measured twice-once when you are at a hospital or clinic, and once when you are not at a hospital or clinic. Record the average of the two measurements. To check your blood pressure when you are not at a hospital or clinic, you can use:  An automated blood pressure machine at a pharmacy.  A home blood pressure monitor.  Talk to your health care provider about your target blood pressure.  If you are between 30-21 years old, ask your health care provider if you should take aspirin to prevent heart disease.  Have regular diabetes screenings by checking your fasting blood sugar level.  If you are at a normal weight and have a low risk for diabetes, have this test once every three years after the age of 57.  If you are overweight and have a high risk for diabetes, consider being tested at a younger age or more often.  A one-time screening for abdominal aortic aneurysm (AAA) by ultrasound is recommended for men aged 98-75 years who are current or former smokers. What should I know about preventing infection? Hepatitis B  If you have a higher risk for hepatitis B, you should be screened for this virus. Talk with your health care provider to find out if you are at risk for hepatitis B infection. Hepatitis C  Blood testing is recommended for:  Everyone born from [birth year] through 1963.  Anyone with known risk factors for hepatitis C. Sexually Transmitted Diseases (STDs)  You  should be screened each year for STDs including gonorrhea and chlamydia if:  You are sexually active and are younger than 69 years of age.  You are older than 69 years of age and your health care provider tells you that you are at risk for this type of infection.  Your sexual activity has changed since you were last screened and you are at an increased risk for chlamydia or gonorrhea. Ask your health care provider if you are at risk.  Talk with your health care provider about whether you are at high risk of being infected with HIV. Your health care provider may recommend a prescription medicine to help prevent HIV infection. What else can I do?  Schedule regular health,  dental, and eye exams.  Stay current with your vaccines (immunizations).  Do not use any tobacco products, such as cigarettes, chewing tobacco, and e-cigarettes. If you need help quitting, ask your health care provider.  Limit alcohol intake to no more than 2 drinks per day. One drink equals 12 ounces of beer, 5 ounces of wine, or 1 ounces of hard liquor.  Do not use street drugs.  Do not share needles.  Ask your health care provider for help if you need support or information about quitting drugs.  Tell your health care provider if you often feel depressed.  Tell your health care provider if you have ever been abused or do not feel safe at home. This information is not intended to replace advice given to you by your health care provider. Make sure you discuss any questions you have with your health care provider. Document Released: 10/31/2007 Document Revised: 01/01/2016 Document Reviewed: 02/05/2015 Elsevier Interactive Patient Education  2017 [employer].

## 2016-09-29 LAB — HEPATITIS C ANTIBODY: HCV AB: NEGATIVE

## 2016-09-29 NOTE — Assessment & Plan Note (Signed)
DM: Diet control, check A1c HTN: Good med compliance, check a CMP and CBC. Ambulatory BPs very good. Hyperlipidemia: Continue Zetia and Crestor. Stop Niaspan, recheck labs on RTC CAD: Asx, plan is to control CV RF, RF NTG. RLS: Currently untreated, previously was prescribed clonazepam but he was somewhat reluctant to take as it is a controlled substance. Rec a trial with gabapentin 100 mg one or 2 tablets every night. Sees urology regularly, reports he is  getting HRT from them but plans to stop HRT. It does help him feel better but he simply does not like to continue with it. Have his Medicare wellness today.. RTC 6 months

## 2016-10-10 ENCOUNTER — Other Ambulatory Visit: Payer: Self-pay | Admitting: Internal Medicine

## 2016-11-16 ENCOUNTER — Other Ambulatory Visit: Payer: Self-pay | Admitting: Internal Medicine

## 2016-11-16 DIAGNOSIS — E785 Hyperlipidemia, unspecified: Secondary | ICD-10-CM

## 2016-12-25 ENCOUNTER — Encounter (HOSPITAL_COMMUNITY): Payer: Self-pay | Admitting: *Deleted

## 2016-12-25 ENCOUNTER — Emergency Department (HOSPITAL_COMMUNITY): Payer: Medicare Other

## 2016-12-25 ENCOUNTER — Inpatient Hospital Stay (HOSPITAL_COMMUNITY)
Admission: EM | Admit: 2016-12-25 | Discharge: 2016-12-29 | DRG: 247 | Disposition: A | Discharge status: Home / Self Care | Payer: Medicare Other | Attending: Cardiology | Admitting: Cardiology

## 2016-12-25 DIAGNOSIS — I10 Essential (primary) hypertension: Secondary | ICD-10-CM | POA: Diagnosis not present

## 2016-12-25 DIAGNOSIS — T82855A Stenosis of coronary artery stent, initial encounter: Secondary | ICD-10-CM | POA: Diagnosis present

## 2016-12-25 DIAGNOSIS — K219 Gastro-esophageal reflux disease without esophagitis: Secondary | ICD-10-CM | POA: Diagnosis present

## 2016-12-25 DIAGNOSIS — Z885 Allergy status to narcotic agent status: Secondary | ICD-10-CM

## 2016-12-25 DIAGNOSIS — Z955 Presence of coronary angioplasty implant and graft: Secondary | ICD-10-CM

## 2016-12-25 DIAGNOSIS — I25810 Atherosclerosis of coronary artery bypass graft(s) without angina pectoris: Secondary | ICD-10-CM | POA: Diagnosis present

## 2016-12-25 DIAGNOSIS — R7989 Other specified abnormal findings of blood chemistry: Secondary | ICD-10-CM

## 2016-12-25 DIAGNOSIS — I214 Non-ST elevation (NSTEMI) myocardial infarction: Principal | ICD-10-CM | POA: Diagnosis present

## 2016-12-25 DIAGNOSIS — Z79899 Other long term (current) drug therapy: Secondary | ICD-10-CM

## 2016-12-25 DIAGNOSIS — E785 Hyperlipidemia, unspecified: Secondary | ICD-10-CM | POA: Diagnosis present

## 2016-12-25 DIAGNOSIS — Z951 Presence of aortocoronary bypass graft: Secondary | ICD-10-CM | POA: Diagnosis not present

## 2016-12-25 DIAGNOSIS — I249 Acute ischemic heart disease, unspecified: Secondary | ICD-10-CM | POA: Diagnosis not present

## 2016-12-25 DIAGNOSIS — R778 Other specified abnormalities of plasma proteins: Secondary | ICD-10-CM

## 2016-12-25 DIAGNOSIS — E119 Type 2 diabetes mellitus without complications: Secondary | ICD-10-CM | POA: Diagnosis not present

## 2016-12-25 DIAGNOSIS — Z7982 Long term (current) use of aspirin: Secondary | ICD-10-CM

## 2016-12-25 DIAGNOSIS — E7800 Pure hypercholesterolemia, unspecified: Secondary | ICD-10-CM | POA: Diagnosis not present

## 2016-12-25 DIAGNOSIS — R079 Chest pain, unspecified: Secondary | ICD-10-CM | POA: Diagnosis not present

## 2016-12-25 HISTORY — DX: Non-ST elevation (NSTEMI) myocardial infarction: I21.4

## 2016-12-25 LAB — I-STAT TROPONIN, ED: TROPONIN I, POC: 0.23 ng/mL — AB (ref 0.00–0.08)

## 2016-12-25 LAB — BASIC METABOLIC PANEL
Anion gap: 9 (ref 5–15)
BUN: 7 mg/dL (ref 6–20)
CALCIUM: 9 mg/dL (ref 8.9–10.3)
CO2: 24 mmol/L (ref 22–32)
CREATININE: 0.88 mg/dL (ref 0.61–1.24)
Chloride: 104 mmol/L (ref 101–111)
GFR calc Af Amer: >60 mL/min (ref >60)
GLUCOSE: 211 mg/dL — AB (ref 65–99)
POTASSIUM: 3.7 mmol/L (ref 3.5–5.1)
SODIUM: 137 mmol/L (ref 135–145)

## 2016-12-25 LAB — CBC
HCT: 41.5 % (ref 39.0–52.0)
Hemoglobin: 14.1 g/dL (ref 13.0–17.0)
MCH: 30.6 pg (ref 26.0–34.0)
MCHC: 34 g/dL (ref 30.0–36.0)
MCV: 90 fL (ref 78.0–100.0)
PLATELETS: 165 10*3/uL (ref 150–400)
RBC: 4.61 MIL/uL (ref 4.22–5.81)
RDW: 14 % (ref 11.5–15.5)
WBC: 5.9 10*3/uL (ref 4.0–10.5)

## 2016-12-25 MED ORDER — NITROGLYCERIN 0.4 MG SL SUBL: 0.4000 mg | SUBLINGUAL_TABLET | SUBLINGUAL | Status: DC | PRN

## 2016-12-25 MED ORDER — HEPARIN BOLUS VIA INFUSION
4000.0000 [IU] | Freq: Once | INTRAVENOUS | Status: AC
  Administered 2016-12-25: 4000 [IU] via INTRAVENOUS
  Filled 2016-12-25: qty 4000

## 2016-12-25 MED ORDER — ONDANSETRON HCL 4 MG/2ML IJ SOLN: 4.0000 mg | Freq: Four times a day (QID) | INTRAMUSCULAR | Status: DC | PRN

## 2016-12-25 MED ORDER — HEPARIN (PORCINE) IN NACL 100-0.45 UNIT/ML-% IJ SOLN
1100.0000 [IU]/h | INTRAMUSCULAR | Status: DC
  Administered 2016-12-25: 1200 [IU]/h via INTRAVENOUS
  Administered 2016-12-26 – 2016-12-28 (×3): 1100 [IU]/h via INTRAVENOUS
  Filled 2016-12-25 (×4): qty 250

## 2016-12-25 MED ORDER — ISOSORBIDE MONONITRATE ER 30 MG PO TB24
30.0000 mg | ORAL_TABLET | Freq: Every day | ORAL | Status: DC
  Administered 2016-12-25 – 2016-12-29 (×5): 30 mg via ORAL
  Filled 2016-12-25 (×5): qty 1

## 2016-12-25 NOTE — ED Triage Notes (Signed)
Pt reports not feeling well this am, then onset today of mid chest discomfort that radiated into his jaw and left arm. Did not take any nitro pta but did take ASA. No acute distress is noted at triage. Has cardiac hx.

## 2016-12-25 NOTE — Progress Notes (Signed)
[facility] for heparin Indication: chest pain/ACS  Heparin Dosing Weight: 86.7 kg   Assessment: 17 yom presenting with CP. Pharmacy consulted to dose heparin for ACS. Not on anticoagulation PTA. CBC wnl. No bleed documented. Pt states weight is 197 lbs in the ED.  Goal of Therapy:  Heparin level 0.3-0.7 units/ml Monitor platelets by anticoagulation protocol: Yes   Plan:  Heparin 4000 unit bolus Start heparin at 1200 units/h 6h heparin level Daily heparin level/CBC Monitor s/sx bleeding    Elicia Lamp, PharmD, BCPS Clinical Pharmacist 12/25/2016 4:09 PM

## 2016-12-25 NOTE — H&P (Signed)
Cardiology Admission History and Physical:   Patient ID: Kevin Webb; [id number]; 1945-10-11   Admission date: 12/25/2016  Primary Care Provider: Colon Branch, MD Primary Cardiologist: Dr. [name]  Primary Electrophysiologist:  None   Chief Complaint:  NSTEMI   Patient Profile:   Kevin Webb is a 71 y.o. male with a history of CAD s/p CABG x4 (2000) and subsequent PCI, HLD, HTN and diet controlled DMT2 who presented to [facility] today with chest pain and found to have an elevated POC troponin.  History of Present Illness:   Mr. [name] underwent bypass surgery in 2000 with LIMA--> LAD, reverse SVG--> intermediate coronary artery, reverse SVG--> acute margin and reverse SVG --> RCA. He was admitted in 2012 for [place] and was treated with PCI with a BMS to the S-RI.   He was last seen by Dr. [name] in the office in 01/2016 and doing well at that time.   He was in his usual state of health until 12/24/16 ~ 2:30 am in morning. He woke up and drank some coffee. Around 3-4 am he felt like he was having reflux and diaphoresis. This self resolved and then he went about his day. He didn't each much and went to bed. He woke up again this morning around 2am he started to have similar symptoms but then it resolved. 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 His pain would resolve with rest and he is now chest pain free. No shortness of breath. No LE edema, orthopnea or PND. No dizziness or syncope.    Past Medical History:  Diagnosis Date  . Acute cholecystitis 04/18/2013  . Adenomatous colon polyp 12/2004  . CAD (coronary artery disease)    a.S/P CABG 12/00. b. cath 3/12: L-LAD ok, S-RI 95% tx with BMS/thrombectomy, S-AM-RCA ok, EF 60%.  . Diabetes mellitus without complication ([place]) 3/57/0177  . Diverticulitis 2008/2009  . GERD (gastroesophageal reflux  disease)   . HTN (hypertension)   . Hyperglycemia   . Hyperlipidemia   . Impingement syndrome of left shoulder 2016  . Low testosterone    Dr [name], [place] ,[place]  . Osteoarthritis of right knee    With trace effusion  . Peyronie disease   . RBBB   . RLS (restless legs syndrome) 08/15/2013  . Sinus bradycardia   . Vitamin D deficiency     Past Surgical History:  Procedure Laterality Date  . CHOLECYSTECTOMY N/A 04/21/2013   Procedure: LAPAROSCOPIC CHOLECYSTECTOMY WITH INTRAOPERATIVE CHOLANGIOGRAM;  Surgeon: Haywood Lasso, MD;  Location: [facility];  Service: General;  Laterality: N/A;  . COLONOSCOPY W/ POLYPECTOMY  2004 & 2009    X 2; Dr Fuller Plan; due 2014  . CORONARY ANGIOPLASTY    . CORONARY ARTERY BYPASS GRAFT  04/1999   4 vessels  . CORONARY STENT PLACEMENT  2012   Plavix  . LAPAROSCOPIC CHOLECYSTECTOMY  04/21/2013   Dr Margot Chimes; acutecholecystitis with necrosis  . VASECTOMY       Medications Prior to Admission: Prior to Admission medications   Medication Sig Start Date End Date Taking? Authorizing Provider  amLODipine (NORVASC) 5 MG tablet Take 1 tablet (5 mg total) by mouth daily. 03/31/16  Yes Minus Breeding, MD  Ascorbic Acid (VITAMIN C) 1000 MG tablet Take 1,000 mg by mouth daily. Reported on 09/26/2015   Yes [provider]  aspirin  81 MG tablet Take 81 mg by mouth daily.   Yes [provider]  Bioflavonoid Products (GRAPE SEED PO) Take 1 capsule by mouth daily. Reported on 09/26/2015   Yes [provider]  Cholecalciferol (VITAMIN D3) 5000 UNITS CAPS Take by mouth daily.   Yes [provider]  co-enzyme Q-10 30 MG capsule Take 30 mg by mouth daily.    Yes [provider]  ezetimibe (ZETIA) 10 MG tablet Take 1 tablet (10 mg total) by mouth daily. 10/13/16  Yes Paz, Alda Berthold, MD  GINKGO BILOBA PO Take 1 capsule by mouth daily.   Yes [provider]  metoprolol tartrate (LOPRESSOR) 25 MG tablet Take 1 tablet (25 mg total) by  mouth 2 (two) times daily. Patient taking differently: Take 25 mg by mouth daily.  01/27/16  Yes Minus Breeding, MD  Multiple Vitamin (MULTIVITAMIN) tablet Take 1 tablet by mouth daily. Reported on 09/26/2015   Yes [provider]  nitroGLYCERIN (NITROSTAT) 0.4 MG SL tablet Place 1 tablet (0.4 mg total) under the tongue every 5 (five) minutes x 3 doses as needed. 09/28/16  Yes Paz, Alda Berthold, MD  Omega-3 Fatty Acids (FISH OIL) 1000 MG CAPS 1 tab po qd    Yes [provider]  pantoprazole (PROTONIX) 40 MG tablet Take 1 tablet (40 mg total) by mouth daily. 11/16/16  Yes Paz, Alda Berthold, MD  RABEprazole (ACIPHEX) 20 MG tablet Take 20 mg by mouth daily.   Yes [provider]  ramipril (ALTACE) 10 MG capsule Take 1 capsule (10 mg total) by mouth daily. 07/07/16  Yes Paz, Alda Berthold, MD  rosuvastatin (CRESTOR) 40 MG tablet Take 1 tablet (40 mg total) by mouth daily. 11/16/16  Yes Paz, Alda Berthold, MD  Saw Palmetto, Serenoa repens, (SAW PALMETTO PO) Take 1 capsule by mouth daily.   Yes [provider]  SELENIUM PO Take 1 tablet by mouth daily. Reported on 09/26/2015   Yes [provider]  sildenafil (VIAGRA) 25 MG tablet Take 50 mg by mouth daily as needed for erectile dysfunction (per urology). Reported on 09/26/2015   Yes [provider]  valACYclovir (VALTREX) 500 MG tablet Take 1 tablet (500 mg total) by mouth as needed. 03/30/16  Yes Paz, Alda Berthold, MD  vitamin E 100 UNIT capsule Take 100 Units by mouth daily.     Yes [provider]  gabapentin (NEURONTIN) 100 MG capsule Take 1-2 capsules (100-200 mg total) by mouth at bedtime as needed (RLS). Patient not taking: Reported on 12/25/2016 09/28/16   Colon Branch, MD     Allergies:    Allergies  Allergen Reactions  . Oxycodone-Acetaminophen     REACTION: RASH - tolerated morphine  Note : Taking oxycodone/APAP 5-325 postoperatively 12/14 without issue  . Darvocet [Propoxyphene N-Acetaminophen]   . Propoxyphene  Hives    Social History:   Social History   Social History  . Marital status: Single    Spouse name: N/A  . Number of children: 1  . Years of education: N/A   Occupational History  . plumber      plumber   Social History Main Topics  . Smoking status: Never Smoker  . Smokeless tobacco: Never Used  . Alcohol use 6.0 - 7.2 oz/week    10 - 12 Cans of beer per week     Comment: socially   . Drug use: No  . Sexual activity: Not on file   Other Topics Concern  .  Not on file   Social History Narrative   Remarried 2014    Family History:   The patient's family history includes Aortic aneurysm (age of onset: 63) in his father; CVA in his mother; Cholecystitis in his sister; Coronary artery disease in his mother; Diabetes in his brother and unknown relative; Heart attack (age of onset: 24) in his father. There is no history of Cancer, Colon cancer, or Prostate cancer.    ROS:  Please see the history of present illness.  All other ROS reviewed and negative.     Physical Exam/Data:   Vitals:   12/25/16 1440 12/25/16 1600  BP: 138/80   Pulse: 69   Resp: 20   Temp: 98.7 F (37.1 C)   TempSrc: Oral   SpO2: 97%   Weight:  197 lb (89.4 kg)  Height:  5\' 8"  (1.727 m)   No intake or output data in the 24 hours ending 12/25/16 1654 Filed Weights   12/25/16 1600  Weight: 197 lb (89.4 kg)   Body mass index is 29.95 kg/m.  General:  Well nourished, well developed, in no acute distress  HEENT: normal Lymph: no adenopathy Neck: noJVD Endocrine:  No thryomegaly Vascular: No carotid bruits; FA pulses 2+ bilaterally without bruits  Cardiac:  normal S1, S2; RRR; no murmur  Lungs:  clear to auscultation bilaterally, no wheezing, rhonchi or rales  Abd: soft, nontender, no hepatomegaly  Ext: no edema Musculoskeletal:  No deformities, BUE and BLE strength normal and equal Skin: warm and dry  Neuro:  CNs 2-12 intact, no focal abnormalities noted Psych:  Normal affect    EKG:   The ECG that was done was personally reviewed and demonstrates sinus with RBBB   Relevant CV Studies:  Laboratory Data:  Chemistry Recent Labs Lab 12/25/16 1443  NA 137  K 3.7  CL 104  CO2 24  GLUCOSE 211*  BUN 7  CREATININE 0.88  CALCIUM 9.0  GFRNONAA >60  GFRAA >60  ANIONGAP 9    No results for input(s): PROT, ALBUMIN, AST, ALT, ALKPHOS, BILITOT in the last 168 hours. Hematology Recent Labs Lab 12/25/16 1443  WBC 5.9  RBC 4.61  HGB 14.1  HCT 41.5  MCV 90.0  MCH 30.6  MCHC 34.0  RDW 14.0  PLT 165   Cardiac EnzymesNo results for input(s): TROPONINI in the last 168 hours.  Recent Labs Lab 12/25/16 1500  TROPIPOC 0.23*    BNPNo results for input(s): BNP, PROBNP in the last 168 hours.  DDimer No results for input(s): DDIMER in the last 168 hours.  Radiology/Studies:  Dg Chest 2 View  Result Date: 12/25/2016 CLINICAL DATA:  Chest pain EXAM: CHEST  2 VIEW COMPARISON:  06/13/2014 FINDINGS: Cardiac shadows within normal limits. Postsurgical changes are again seen. The lungs are well aerated bilaterally. No focal infiltrate or sizable effusion is noted. No acute bony abnormality is seen. IMPRESSION: No acute abnormality noted. Electronically Signed   By: Inez Catalina M.D.   On: 12/25/2016 15:01    Assessment and Plan:   NSTEMI: POC troponin 0.23 and symptoms similar to previous unstable angina. We will continue IV heparin. Continue ASA, statin and BB. Will add imdur 30mg  daily.   HTN: BP well controlled. Continue current meds  HLD: continue crestor and zetia   Severity of Illness: The appropriate patient status for this patient is INPATIENT. Inpatient status is judged to be reasonable and necessary in order to provide the required intensity of service to ensure the  patient's safety. The patient's presenting symptoms, physical exam findings, and initial radiographic and laboratory data in the context of their chronic comorbidities is felt to place them at high  risk for further clinical deterioration. Furthermore, it is not anticipated that the patient will be medically stable for discharge from the hospital within 2 midnights of admission. The following factors support the patient status of inpatient.   " The patient's presenting symptoms include chest pain. " The worrisome physical exam findings include none. " The initial radiographic and laboratory data are worrisome because of elevated troponin. " The chronic co-morbidities include HTN, HLD, DM   * I certify that at the point of admission it is my clinical judgment that the patient will require inpatient hospital care spanning beyond 2 midnights from the point of admission due to high intensity of service, high risk for further deterioration and high frequency of surveillance required.*    Signed, Angelena Form, PA-C  12/25/2016 4:54 PM  Patient seen and examined and history reviewed. Agree with above findings and plan. Very pleasant 71 yo WM with history of CAD s/p CABG in 2000 and subsequent stenting of SVG to ramus intermediate in March 2012. Was doing very well until about 5 days ago when he experienced chest tightness radiating up into his throat. This is similar to prior angina. Since then has had several more episodes of increasing frequency chest pain. Today pain did not resolve and he came into ED. Now pain 1/10. 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 He is compliant with medication.   On exam he is in NAD No JVD or bruits.  Lungs are clear. CV RRR without gallop or murmur. Pulses 2+, no edema. Neuro intact.  Ecg shows NSR with old LAFB and RBBB.  I stat troponin 0.23. CXR no acute change  Impression: 1. ACS with probable NSTEMI. Remote CABG in 2000, PCI in 2012. Accelerating symptoms. He has been on good medical therapy with 2 antianginal agents. Recommend admission. Cycle cardiac enzymes. Serial Ecg. IV heparin. Add Imdur. If recurrent chest  pain would switch to IV Ntg. Recommend invasive evaluation with cardiac cath on Monday. The procedure and risks were reviewed including but not limited to death, myocardial infarction, stroke, arrythmias, bleeding, transfusion, emergency surgery, dye allergy, or renal dysfunction. The patient voices understanding and is agreeable to proceed. 2. HTN controlled. Continue prior BP meds 3. HLD on high dose Crestor and Zetia.  Jamel Dunton [place], [place] 12/25/2016 6:24 PM

## 2016-12-25 NOTE — ED Provider Notes (Signed)
[facility] DEPT Provider Note   CSN: [id number] Arrival date & time: 12/25/16  1411     History   Chief Complaint Chief Complaint  Patient presents with  . Chest Pain    HPI Kevin Webb is a 71 y.o. male.  Patient with hx cad/cabg 1999, states had chest pain onset at 0230 this AM - got up and took shower.  Lasted several minutes.  Then recurred this AM when walking around at work, worse w exertion. Pain mid chest, heaviness, radiates to jaw. +sob.  No nausea or vomiting. No diaphoresis. Denies other recent cp or discomfort. No pleuritic or constant pain. No leg pain or swelling. No fever or chills.    The history is provided by the patient.  Chest Pain   Pertinent negatives include no abdominal pain, no back pain, no fever, no headaches, no palpitations, no shortness of breath and no vomiting.    Past Medical History:  Diagnosis Date  . Acute cholecystitis 04/18/2013  . Adenomatous colon polyp 12/2004  . CAD (coronary artery disease)    a.S/P CABG 12/00. b. cath 3/12: L-LAD ok, S-RI 95% tx with BMS/thrombectomy, S-AM-RCA ok, EF 60%.  . Diabetes mellitus without complication ([place]) 6/73/4193  . Diverticulitis 2008/2009  . GERD (gastroesophageal reflux disease)   . HTN (hypertension)   . Hyperglycemia   . Hyperlipidemia   . Impingement syndrome of left shoulder 2016  . Low testosterone    Dr [name], [place] ,[place]  . Osteoarthritis of right knee    With trace effusion  . Peyronie disease   . RBBB   . RLS (restless legs syndrome) 08/15/2013  . Sinus bradycardia   . Vitamin D deficiency     Patient Active Problem List   Diagnosis Date Noted  . Diabetes mellitus without complication ([place]) 79/06/4095  . Follow-up--------PCP notes  01/27/2015  . Annual physical exam 04/25/2014  . RLS (restless legs syndrome) 08/15/2013  . Overweight(278.02) 11/28/2010  . DIVERTICULITIS, ACUTE 04/09/2010  . VITAMIN D DEFICIENCY 11/08/2009  . Dyslipidemia 08/07/2008  . GERD  02/22/2007  . TESTOSTERONE DEFICIENCY 09/30/2006  . HTN (hypertension) 09/30/2006  . Coronary atherosclerosis of artery bypass graft 09/30/2006  . COLONIC POLYPS, HX OF 09/30/2006    Past Surgical History:  Procedure Laterality Date  . CHOLECYSTECTOMY N/A 04/21/2013   Procedure: LAPAROSCOPIC CHOLECYSTECTOMY WITH INTRAOPERATIVE CHOLANGIOGRAM;  Surgeon: Haywood Lasso, MD;  Location: [facility];  Service: General;  Laterality: N/A;  . COLONOSCOPY W/ POLYPECTOMY  2004 & 2009    X 2; Dr Fuller Plan; due 2014  . CORONARY ANGIOPLASTY    . CORONARY ARTERY BYPASS GRAFT  04/1999   4 vessels  . CORONARY STENT PLACEMENT  2012   Plavix  . LAPAROSCOPIC CHOLECYSTECTOMY  04/21/2013   Dr Margot Chimes; acutecholecystitis with necrosis  . VASECTOMY         Home Medications    Prior to Admission medications   Medication Sig Start Date End Date Taking? Authorizing Provider  amLODipine (NORVASC) 5 MG tablet Take 1 tablet (5 mg total) by mouth daily. 03/31/16   Minus Breeding, MD  Ascorbic Acid (VITAMIN C) 1000 MG tablet Take 1,000 mg by mouth daily. Reported on 09/26/2015    [provider]  aspirin 81 MG tablet Take 81 mg by mouth daily.    [provider]  Bioflavonoid Products (GRAPE SEED PO) Take 1 capsule by mouth daily. Reported on 09/26/2015    [provider]  Cholecalciferol (VITAMIN D3) 5000 UNITS CAPS Take  by mouth daily.    [provider]  co-enzyme Q-10 30 MG capsule Take 30 mg by mouth daily.     [provider]  ezetimibe (ZETIA) 10 MG tablet Take 1 tablet (10 mg total) by mouth daily. 10/13/16   Colon Branch, MD  gabapentin (NEURONTIN) 100 MG capsule Take 1-2 capsules (100-200 mg total) by mouth at bedtime as needed (RLS). 09/28/16   Colon Branch, MD  metoprolol tartrate (LOPRESSOR) 25 MG tablet Take 1 tablet (25 mg total) by mouth 2 (two) times daily. 01/27/16   Minus Breeding, MD  Multiple Vitamin (MULTIVITAMIN) tablet Take 1 tablet by mouth daily.  Reported on 09/26/2015    [provider]  nitroGLYCERIN (NITROSTAT) 0.4 MG SL tablet Place 1 tablet (0.4 mg total) under the tongue every 5 (five) minutes x 3 doses as needed. 09/28/16   Colon Branch, MD  Omega-3 Fatty Acids (FISH OIL) 1000 MG CAPS 1 tab po qd     [provider]  pantoprazole (PROTONIX) 40 MG tablet Take 1 tablet (40 mg total) by mouth daily. 11/16/16   Colon Branch, MD  ramipril (ALTACE) 10 MG capsule Take 1 capsule (10 mg total) by mouth daily. 07/07/16   Colon Branch, MD  rosuvastatin (CRESTOR) 40 MG tablet Take 1 tablet (40 mg total) by mouth daily. 11/16/16   Colon Branch, MD  Saw Palmetto, Serenoa repens, (SAW PALMETTO PO) Take 1 capsule by mouth daily.    [provider]  SELENIUM PO Take 1 tablet by mouth daily. Reported on 09/26/2015    [provider]  sildenafil (VIAGRA) 25 MG tablet Take 50 mg by mouth daily as needed for erectile dysfunction (per urology). Reported on 09/26/2015    [provider]  valACYclovir (VALTREX) 500 MG tablet Take 1 tablet (500 mg total) by mouth as needed. 03/30/16   Colon Branch, MD  vitamin E 100 UNIT capsule Take 100 Units by mouth daily.      [provider]    Family History Family History  Problem Relation Age of Onset  . Aortic aneurysm Father 20       ? abdominal  . Heart attack Father 89  . Diabetes Unknown        PGaunt  . Coronary artery disease Mother        CABG in 59s  . CVA Mother        cns bleed from warfarin  . Diabetes Brother   . Cholecystitis Sister   . Cancer Neg Hx   . Colon cancer Neg Hx   . Prostate cancer Neg Hx     Social History Social History  Substance Use Topics  . Smoking status: Never Smoker  . Smokeless tobacco: Never Used  . Alcohol use 6.0 - 7.2 oz/week    10 - 12 Cans of beer per week     Comment: socially      Allergies   Oxycodone-acetaminophen; Darvocet [propoxyphene n-acetaminophen]; and Propoxyphene   Review of Systems Review of  Systems  Constitutional: Negative for fever.  HENT: Negative for sore throat.   Eyes: Negative for redness.  Respiratory: Negative for shortness of breath.   Cardiovascular: Positive for chest pain. Negative for palpitations and leg swelling.  Gastrointestinal: Negative for abdominal pain and vomiting.  Endocrine: Negative for polyuria.  Genitourinary: Negative for flank pain.  Musculoskeletal: Negative for back pain and neck pain.  Skin: Negative for rash.  Neurological: Negative for  headaches.  Hematological: Does not bruise/bleed easily.  Psychiatric/Behavioral: Negative for confusion.     Physical Exam Updated Vital Signs BP 138/80 (BP Location: Right Arm)   Pulse 69   Temp 98.7 F (37.1 C) (Oral)   Resp 20   SpO2 97%   Physical Exam  Constitutional: He is oriented to person, place, and time. He appears well-developed and well-nourished. No distress.  HENT:  Mouth/Throat: Oropharynx is clear and moist.  Eyes: Conjunctivae are normal.  Neck: Neck supple. No tracheal deviation present.  Cardiovascular: Normal rate, regular rhythm, normal heart sounds and intact distal pulses.  Exam reveals no gallop and no friction rub.   No murmur heard. Pulmonary/Chest: Effort normal and breath sounds normal. No accessory muscle usage. No respiratory distress.  Abdominal: Soft. Bowel sounds are normal. He exhibits no distension. There is no tenderness.  Musculoskeletal: He exhibits no edema.  Neurological: He is alert and oriented to person, place, and time.  Skin: Skin is warm and dry. He is not diaphoretic.  Psychiatric: He has a normal mood and affect.  Nursing note and vitals reviewed.    ED Treatments / Results  Labs (all labs ordered are listed, but only abnormal results are displayed) Results for orders placed or performed during the hospital encounter of 82/50/53  Basic metabolic panel  Result Value Ref Range   Sodium 137 135 - 145 mmol/L   Potassium 3.7 3.5 - 5.1 mmol/L    Chloride 104 101 - 111 mmol/L   CO2 24 22 - 32 mmol/L   Glucose, Bld 211 (H) 65 - 99 mg/dL   BUN 7 6 - 20 mg/dL   Creatinine, Ser 0.88 0.61 - 1.24 mg/dL   Calcium 9.0 8.9 - 10.3 mg/dL   GFR calc non Af Amer >60 >60 mL/min   GFR calc Af Amer >60 >60 mL/min   Anion gap 9 5 - 15  CBC  Result Value Ref Range   WBC 5.9 4.0 - 10.5 K/uL   RBC 4.61 4.22 - 5.81 MIL/uL   Hemoglobin 14.1 13.0 - 17.0 g/dL   HCT 41.5 39.0 - 52.0 %   MCV 90.0 78.0 - 100.0 fL   MCH 30.6 26.0 - 34.0 pg   MCHC 34.0 30.0 - 36.0 g/dL   RDW 14.0 11.5 - 15.5 %   Platelets 165 150 - 400 K/uL  I-stat troponin, ED  Result Value Ref Range   Troponin i, poc 0.23 (HH) 0.00 - 0.08 ng/mL   Comment NOTIFIED PHYSICIAN    Comment 3           Dg Chest 2 View  Result Date: 12/25/2016 CLINICAL DATA:  Chest pain EXAM: CHEST  2 VIEW COMPARISON:  06/13/2014 FINDINGS: Cardiac shadows within normal limits. Postsurgical changes are again seen. The lungs are well aerated bilaterally. No focal infiltrate or sizable effusion is noted. No acute bony abnormality is seen. IMPRESSION: No acute abnormality noted. Electronically Signed   By: Inez Catalina M.D.   On: 12/25/2016 15:01    EKG  EKG Interpretation  Date/Time:  Friday December 25 2016 14:34:35 EDT Ventricular Rate:  68 PR Interval:  142 QRS Duration: 152 QT Interval:  450 QTC Calculation: 478 R Axis:   -62 Text Interpretation:  Sinus rhythm with marked sinus arrhythmia Left axis deviation Right bundle branch block Abnormal ECG Confirmed by Lajean Saver [phone]) on 12/25/2016 3:50:30 PM       Radiology Dg Chest 2 View  Result Date: 12/25/2016 CLINICAL DATA:  Chest pain EXAM: CHEST  2 VIEW COMPARISON:  06/13/2014 FINDINGS: Cardiac shadows within normal limits. Postsurgical changes are again seen. The lungs are well aerated bilaterally. No focal infiltrate or sizable effusion is noted. No acute bony abnormality is seen. IMPRESSION: No acute abnormality noted. Electronically  Signed   By: Inez Catalina M.D.   On: 12/25/2016 15:01    Procedures Procedures (including critical care time)  Medications Ordered in ED Medications - No data to display   Initial Impression / Assessment and Plan / ED Course  I have reviewed the triage vital signs and the nursing notes.  Pertinent labs & imaging results that were available during my care of the patient were reviewed by me and considered in my medical decision making (see chart for details).  Iv ns. Continuous pulse ox and monitor. Ecg. Labs.   Patient already took 4 asa today.  No chest pain or discomfort currently.  Trop elevated.    Heparin started. Cardiology consulted.  Suspect ACS.    Final Clinical Impressions(s) / ED Diagnoses   Final diagnoses:  None    New Prescriptions New Prescriptions   No medications on file     Lajean Saver, MD 12/25/16 [phone]

## 2016-12-26 LAB — CBC
HCT: 38.1 % — ABNORMAL LOW (ref 39.0–52.0)
Hemoglobin: 13 g/dL (ref 13.0–17.0)
MCH: 30.7 pg (ref 26.0–34.0)
MCHC: 34.1 g/dL (ref 30.0–36.0)
MCV: 89.9 fL (ref 78.0–100.0)
PLATELETS: 150 10*3/uL (ref 150–400)
RBC: 4.24 MIL/uL (ref 4.22–5.81)
RDW: 13.9 % (ref 11.5–15.5)
WBC: 5.3 10*3/uL (ref 4.0–10.5)

## 2016-12-26 LAB — COMPREHENSIVE METABOLIC PANEL
ALBUMIN: 3.4 g/dL — AB (ref 3.5–5.0)
ALT: 22 U/L (ref 17–63)
ANION GAP: 9 (ref 5–15)
AST: 29 U/L (ref 15–41)
Alkaline Phosphatase: 37 U/L — ABNORMAL LOW (ref 38–126)
BILIRUBIN TOTAL: 0.9 mg/dL (ref 0.3–1.2)
BUN: 7 mg/dL (ref 6–20)
CALCIUM: 8.8 mg/dL — AB (ref 8.9–10.3)
CHLORIDE: 105 mmol/L (ref 101–111)
CO2: 25 mmol/L (ref 22–32)
CREATININE: 0.94 mg/dL (ref 0.61–1.24)
GFR calc Af Amer: >60 mL/min (ref >60)
Glucose, Bld: 125 mg/dL — ABNORMAL HIGH (ref 65–99)
POTASSIUM: 4 mmol/L (ref 3.5–5.1)
Sodium: 139 mmol/L (ref 135–145)
TOTAL PROTEIN: 5.9 g/dL — AB (ref 6.5–8.1)

## 2016-12-26 LAB — LIPID PANEL
Cholesterol: 118 mg/dL (ref 0–200)
HDL: 35 mg/dL — AB (ref >40)
LDL CALC: 47 mg/dL (ref 0–99)
TRIGLYCERIDES: 179 mg/dL — AB (ref <150)
Total CHOL/HDL Ratio: 3.4 RATIO
VLDL: 36 mg/dL (ref 0–40)

## 2016-12-26 LAB — TROPONIN I
TROPONIN I: 0.33 ng/mL — AB (ref <0.03)
TROPONIN I: 0.23 ng/mL — AB (ref <0.03)
Troponin I: 0.43 ng/mL (ref <0.03)

## 2016-12-26 LAB — PROTIME-INR
INR: 1.19
INR: 1.22
Prothrombin Time: 15.4 seconds — ABNORMAL HIGH (ref 11.4–15.2)
Prothrombin Time: 15.2 seconds (ref 11.4–15.2)

## 2016-12-26 LAB — MRSA PCR SCREENING: MRSA BY PCR: NEGATIVE

## 2016-12-26 LAB — TSH: TSH: 3.622 u[IU]/mL (ref 0.350–4.500)

## 2016-12-26 LAB — HEPARIN LEVEL (UNFRACTIONATED)
HEPARIN UNFRACTIONATED: 0.56 [IU]/mL (ref 0.30–0.70)
Heparin Unfractionated: 0.52 IU/mL (ref 0.30–0.70)
Heparin Unfractionated: 0.72 IU/mL — ABNORMAL HIGH (ref 0.30–0.70)

## 2016-12-26 MED ORDER — EZETIMIBE 10 MG PO TABS
10.0000 mg | ORAL_TABLET | Freq: Every day | ORAL | Status: DC
  Administered 2016-12-26 – 2016-12-29 (×4): 10 mg via ORAL
  Filled 2016-12-26 (×4): qty 1

## 2016-12-26 MED ORDER — ROSUVASTATIN CALCIUM 20 MG PO TABS
40.0000 mg | ORAL_TABLET | Freq: Every day | ORAL | Status: DC
  Administered 2016-12-26 – 2016-12-29 (×4): 40 mg via ORAL
  Filled 2016-12-26: qty 2
  Filled 2016-12-26: qty 1
  Filled 2016-12-26: qty 2
  Filled 2016-12-26: qty 1
  Filled 2016-12-26: qty 2
  Filled 2016-12-26: qty 1
  Filled 2016-12-26: qty 2

## 2016-12-26 MED ORDER — ACETAMINOPHEN 325 MG PO TABS
650.0000 mg | ORAL_TABLET | Freq: Four times a day (QID) | ORAL | Status: DC | PRN
  Administered 2016-12-26 – 2016-12-27 (×3): 650 mg via ORAL
  Filled 2016-12-26 (×5): qty 2

## 2016-12-26 MED ORDER — SODIUM CHLORIDE 0.9 % IV SOLN: 250.0000 mL | INTRAVENOUS | Status: DC | PRN

## 2016-12-26 MED ORDER — ASPIRIN 81 MG PO CHEW
81.0000 mg | CHEWABLE_TABLET | Freq: Every day | ORAL | Status: DC
  Administered 2016-12-26 – 2016-12-27 (×2): 81 mg via ORAL
  Filled 2016-12-26 (×2): qty 1

## 2016-12-26 MED ORDER — SODIUM CHLORIDE 0.9% FLUSH: 3.0000 mL | INTRAVENOUS | Status: DC | PRN

## 2016-12-26 MED ORDER — PANTOPRAZOLE SODIUM 40 MG PO TBEC
40.0000 mg | DELAYED_RELEASE_TABLET | Freq: Every day | ORAL | Status: DC
  Administered 2016-12-26 – 2016-12-29 (×3): 40 mg via ORAL
  Filled 2016-12-26 (×5): qty 1

## 2016-12-26 MED ORDER — AMLODIPINE BESYLATE 5 MG PO TABS
5.0000 mg | ORAL_TABLET | Freq: Every day | ORAL | Status: DC
  Administered 2016-12-26 – 2016-12-29 (×4): 5 mg via ORAL
  Filled 2016-12-26 (×4): qty 1

## 2016-12-26 MED ORDER — METOPROLOL TARTRATE 25 MG PO TABS
25.0000 mg | ORAL_TABLET | Freq: Two times a day (BID) | ORAL | Status: DC
  Administered 2016-12-28 – 2016-12-29 (×3): 25 mg via ORAL
  Filled 2016-12-26 (×4): qty 1

## 2016-12-26 MED ORDER — SODIUM CHLORIDE 0.9% FLUSH
3.0000 mL | Freq: Two times a day (BID) | INTRAVENOUS | Status: DC
  Administered 2016-12-26 – 2016-12-28 (×3): 3 mL via INTRAVENOUS

## 2016-12-26 NOTE — Progress Notes (Signed)
Progress Note  Patient Name: Kevin Webb Date of Encounter: 12/26/2016  Primary Cardiologist: Hochrein  Subjective   Feeling well without chest pain.  Inpatient Medications    Scheduled Meds: . amLODipine  5 mg Oral Daily  . aspirin  81 mg Oral Daily  . ezetimibe  10 mg Oral Daily  . isosorbide mononitrate  30 mg Oral Daily  . metoprolol tartrate  25 mg Oral BID  . pantoprazole  40 mg Oral Daily  . rosuvastatin  40 mg Oral Daily  . sodium chloride flush  3 mL Intravenous Q12H   Continuous Infusions: . sodium chloride    . heparin 1,100 Units/hr (12/26/16 1014)   PRN Meds: sodium chloride, nitroGLYCERIN, ondansetron (ZOFRAN) IV, sodium chloride flush   Vital Signs    Vitals:   12/26/16 0436 12/26/16 0801 12/26/16 0817 12/26/16 1034  BP: 106/61 106/73  114/69  Pulse: (!) 45 (!) 48  (!) 46  Resp: 15 15  14   Temp: 97.6 F (36.4 C)  97.7 F (36.5 C)   TempSrc: Oral  Oral   SpO2: 99% 96%  100%  Weight: 198 lb (89.8 kg)     Height: 5\' 8"  (1.727 m)       Intake/Output Summary (Last 24 hours) at 12/26/16 1133 Last data filed at 12/26/16 1014  Gross per 24 hour  Intake           439.47 ml  Output              200 ml  Net           239.47 ml   Filed Weights   12/25/16 1600 12/26/16 0436  Weight: 197 lb (89.4 kg) 198 lb (89.8 kg)    Telemetry    SR - Personally Reviewed  ECG    SR, RBBB, LAFB - Personally Reviewed  Physical Exam   GEN: No acute distress.   Neck: No JVD Cardiac: RRR, no murmurs, rubs, or gallops.  Respiratory: Clear to auscultation bilaterally. GI: Soft, nontender, non-distended  MS: No edema; No deformity. Neuro:  Nonfocal  Psych: Normal affect   Labs    Chemistry Recent Labs Lab 12/25/16 1443 12/26/16 0520  NA 137 139  K 3.7 4.0  CL 104 105  CO2 24 25  GLUCOSE 211* 125*  BUN 7 7  CREATININE 0.88 0.94  CALCIUM 9.0 8.8*  PROT  --  5.9*  ALBUMIN  --  3.4*  AST  --  29  ALT  --  22  ALKPHOS  --  37*  BILITOT  --   0.9  GFRNONAA >60 >60  GFRAA >60 >60  ANIONGAP 9 9     Hematology Recent Labs Lab 12/25/16 1443 12/26/16 0520  WBC 5.9 5.3  RBC 4.61 4.24  HGB 14.1 13.0  HCT 41.5 38.1*  MCV 90.0 89.9  MCH 30.6 30.7  MCHC 34.0 34.1  RDW 14.0 13.9  PLT 165 150    Cardiac Enzymes Recent Labs Lab 12/25/16 2350 12/26/16 0520  TROPONINI 0.43* 0.33*    Recent Labs Lab 12/25/16 1500  TROPIPOC 0.23*     BNPNo results for input(s): BNP, PROBNP in the last 168 hours.   DDimer No results for input(s): DDIMER in the last 168 hours.   Radiology    Dg Chest 2 View  Result Date: 12/25/2016 CLINICAL DATA:  Chest pain EXAM: CHEST  2 VIEW COMPARISON:  06/13/2014 FINDINGS: Cardiac shadows within normal limits. Postsurgical changes are again seen.  The lungs are well aerated bilaterally. No focal infiltrate or sizable effusion is noted. No acute bony abnormality is seen. IMPRESSION: No acute abnormality noted. Electronically Signed   By: Inez Catalina M.D.   On: 12/25/2016 15:01    Cardiac Studies   Cath pending  Patient Profile     71 y.o. male history of CAD s/p CABG x4 (2000) and subsequent PCI, HLD, HTN and diet controlled DMT2 who presented to [facility] today with chest pain and found to have an elevated POC troponin.  Assessment & Plan    1. NSTEMI. Remote CABG with stents placed in 2012. On heparin currently with plan for cath on Monday. Patient without chest pain. No further changes 2. HTN: BP well controlled. No med changes 3. HLD: continue crestor, zetia  Signed, Will Meredith Leeds, MD  12/26/2016, 11:33 AM

## 2016-12-26 NOTE — Progress Notes (Signed)
Pt states he had a brief pain in both cheeks, pt states it went away and thinks he was gritting his teeth and that is what it was from. Pt describes it as dull and 1/10. Pt denies any chest pain or sob. B/p 113/73 and HR 50. Dr Lennie Odor on the unit and just in pt's room

## 2016-12-26 NOTE — Progress Notes (Addendum)
ANTICOAGULATION CONSULT NOTE - Follow Up Consult  Pharmacy Consult for heparin Indication: NSTEMI  Labs:  Recent Labs  12/25/16 1443 12/25/16 2350 12/25/16 2355 12/26/16 0520  HGB 14.1  --   --  13.0  HCT 41.5  --   --  38.1*  PLT 165  --   --  150  LABPROT  --   --  15.4* 15.2  INR  --   --  1.22 1.19  HEPARINUNFRC  --  0.72*  --  0.56  CREATININE 0.88  --   --  0.94  TROPONINI  --  0.43*  --  0.33*    Assessment: 71yoM on heparin for ACS rule out. Heparin level therapeutic at 0.56, CBC stable and wnl. Planning for cardiac cath on Monday.  Goal of Therapy:  Heparin level 0.3-0.7 units/ml   Plan:  -Continue heparin at 1100 units/hr -Check 6-hr confirmatory heparin level -Monitor heparin level, CBC, S/Sx bleeding daily -Follow-up plans for cath  ADDENDUM: Confirmatory heparin level drawn early but remains therapeutic at 0.52.  Plan: -Continue heparin at 100 units/hr as noted above  Arrie Senate, PharmD PGY-2 Cardiology Pharmacy Resident Pager: [phone] 12/26/2016

## 2016-12-26 NOTE — Progress Notes (Signed)
ANTICOAGULATION CONSULT NOTE - Follow Up Consult  Pharmacy Consult for heparin Indication: NSTEMI  Labs:  Recent Labs  12/25/16 1443 12/25/16 2350  HGB 14.1  --   HCT 41.5  --   PLT 165  --   HEPARINUNFRC  --  0.72*  CREATININE 0.88  --     Assessment: 71yo male slightly above goal on heparin with initial dosing for NSTEMI.  Goal of Therapy:  Heparin level 0.3-0.7 units/ml   Plan:  Will decrease heparin gtt slightly to 1100 units/hr and check level with next lab draw.  Wynona Neat, PharmD, BCPS  12/26/2016,12:29 AM

## 2016-12-27 LAB — CBC
HEMATOCRIT: 37.5 % — AB (ref 39.0–52.0)
Hemoglobin: 12.8 g/dL — ABNORMAL LOW (ref 13.0–17.0)
MCH: 30.5 pg (ref 26.0–34.0)
MCHC: 34.1 g/dL (ref 30.0–36.0)
MCV: 89.5 fL (ref 78.0–100.0)
Platelets: 140 10*3/uL — ABNORMAL LOW (ref 150–400)
RBC: 4.19 MIL/uL — ABNORMAL LOW (ref 4.22–5.81)
RDW: 13.7 % (ref 11.5–15.5)
WBC: 6.4 10*3/uL (ref 4.0–10.5)

## 2016-12-27 LAB — HEPARIN LEVEL (UNFRACTIONATED): Heparin Unfractionated: 0.64 IU/mL (ref 0.30–0.70)

## 2016-12-27 LAB — HEMOGLOBIN A1C
Hgb A1c MFr Bld: 6.1 % — ABNORMAL HIGH (ref 4.8–5.6)
MEAN PLASMA GLUCOSE: 128 mg/dL

## 2016-12-27 MED ORDER — SODIUM CHLORIDE 0.9 % IV SOLN: 250.0000 mL | INTRAVENOUS | Status: DC | PRN

## 2016-12-27 MED ORDER — SODIUM CHLORIDE 0.9% FLUSH: 3.0000 mL | INTRAVENOUS | Status: DC | PRN

## 2016-12-27 MED ORDER — SODIUM CHLORIDE 0.9 % WEIGHT BASED INFUSION
3.0000 mL/kg/h | INTRAVENOUS | Status: DC
  Administered 2016-12-28: 3 mL/kg/h via INTRAVENOUS

## 2016-12-27 MED ORDER — ASPIRIN 81 MG PO CHEW
81.0000 mg | CHEWABLE_TABLET | ORAL | Status: AC
  Administered 2016-12-28: 81 mg via ORAL
  Filled 2016-12-27: qty 1

## 2016-12-27 MED ORDER — ASPIRIN 81 MG PO CHEW
81.0000 mg | CHEWABLE_TABLET | Freq: Every day | ORAL | Status: DC
  Administered 2016-12-29: 81 mg via ORAL
  Filled 2016-12-27: qty 1

## 2016-12-27 MED ORDER — SODIUM CHLORIDE 0.9 % WEIGHT BASED INFUSION
1.0000 mL/kg/h | INTRAVENOUS | Status: DC
  Administered 2016-12-28 (×2): 1 mL/kg/h via INTRAVENOUS

## 2016-12-27 MED ORDER — SODIUM CHLORIDE 0.9% FLUSH
3.0000 mL | Freq: Two times a day (BID) | INTRAVENOUS | Status: DC
  Administered 2016-12-28: 3 mL via INTRAVENOUS

## 2016-12-27 NOTE — Progress Notes (Signed)
ANTICOAGULATION CONSULT NOTE - Follow Up Consult  Pharmacy Consult for heparin Indication: NSTEMI  Labs:  Recent Labs  12/25/16 1443  12/25/16 2350 12/25/16 2355 12/26/16 0520 12/26/16 1053 12/27/16 0253  HGB 14.1  --   --   --  13.0  --  12.8*  HCT 41.5  --   --   --  38.1*  --  37.5*  PLT 165  --   --   --  150  --  140*  LABPROT  --   --   --  15.4* 15.2  --   --   INR  --   --   --  1.22 1.19  --   --   HEPARINUNFRC  --   < > 0.72*  --  0.56 0.52 0.64  CREATININE 0.88  --   --   --  0.94  --   --   TROPONINI  --   --  0.43*  --  0.33* 0.23*  --   < > = values in this interval not displayed.  Assessment: 71yoM on heparin for ACS rule out. Heparin level therapeutic at 0.64, Hgb wnl, pltc down slightly. Planning for cardiac cath on Monday.  Goal of Therapy:  Heparin level 0.3-0.7 units/ml   Plan:  -Continue heparin at 1100 units/hr -Monitor heparin level, CBC, S/Sx bleeding daily -Plan for cath lab on Monday  Arrie Senate, PharmD PGY-2 Cardiology Pharmacy Resident Pager: [phone] 12/27/2016

## 2016-12-27 NOTE — Progress Notes (Signed)
Progress Note  Patient Name: Kevin Webb Date of Encounter: 12/27/2016  Primary Cardiologist: Hochrein  Subjective   Feeling well, no chest pain  Inpatient Medications    Scheduled Meds: . amLODipine  5 mg Oral Daily  . aspirin  81 mg Oral Daily  . ezetimibe  10 mg Oral Daily  . isosorbide mononitrate  30 mg Oral Daily  . metoprolol tartrate  25 mg Oral BID  . pantoprazole  40 mg Oral Daily  . rosuvastatin  40 mg Oral Daily  . sodium chloride flush  3 mL Intravenous Q12H   Continuous Infusions: . sodium chloride    . heparin 1,100 Units/hr (12/27/16 0647)   PRN Meds: sodium chloride, acetaminophen, nitroGLYCERIN, ondansetron (ZOFRAN) IV, sodium chloride flush   Vital Signs    Vitals:   12/26/16 1944 12/26/16 2318 12/27/16 0324 12/27/16 0500  BP: 107/67 101/61 (!) 106/58   Pulse: 60 (!) 43 (!) 53   Resp: 19 17 18    Temp: 98.1 F (36.7 C) 98.2 F (36.8 C) 97.9 F (36.6 C)   TempSrc: Oral Oral Oral   SpO2: 97% 98% 95%   Weight:    197 lb 1.6 oz (89.4 kg)  Height:        Intake/Output Summary (Last 24 hours) at 12/27/16 0859 Last data filed at 12/27/16 0841  Gross per 24 hour  Intake           957.42 ml  Output             1275 ml  Net          -317.58 ml   Filed Weights   12/25/16 1600 12/26/16 0436 12/27/16 0500  Weight: 197 lb (89.4 kg) 198 lb (89.8 kg) 197 lb 1.6 oz (89.4 kg)    Telemetry    SR - Personally Reviewed  ECG    SR, RBBB - Personally Reviewed  Physical Exam   GEN: Well nourished, well developed, in no acute distress  HEENT: normal  Neck: no JVD, carotid bruits, or masses Cardiac: RRR; no murmurs, rubs, or gallops,no edema  Respiratory:  clear to auscultation bilaterally, normal work of breathing GI: soft, nontender, nondistended, + BS MS: no deformity or atrophy  Skin: warm and dry Neuro:  Strength and sensation are intact Psych: euthymic mood, full affect    Labs    Chemistry  Recent Labs Lab 12/25/16 1443  12/26/16 0520  NA 137 139  K 3.7 4.0  CL 104 105  CO2 24 25  GLUCOSE 211* 125*  BUN 7 7  CREATININE 0.88 0.94  CALCIUM 9.0 8.8*  PROT  --  5.9*  ALBUMIN  --  3.4*  AST  --  29  ALT  --  22  ALKPHOS  --  37*  BILITOT  --  0.9  GFRNONAA >60 >60  GFRAA >60 >60  ANIONGAP 9 9     Hematology  Recent Labs Lab 12/25/16 1443 12/26/16 0520 12/27/16 0253  WBC 5.9 5.3 6.4  RBC 4.61 4.24 4.19*  HGB 14.1 13.0 12.8*  HCT 41.5 38.1* 37.5*  MCV 90.0 89.9 89.5  MCH 30.6 30.7 30.5  MCHC 34.0 34.1 34.1  RDW 14.0 13.9 13.7  PLT 165 150 140*    Cardiac Enzymes  Recent Labs Lab 12/25/16 2350 12/26/16 0520 12/26/16 1053  TROPONINI 0.43* 0.33* 0.23*     Recent Labs Lab 12/25/16 1500  TROPIPOC 0.23*     BNPNo results for input(s): BNP, PROBNP in  the last 168 hours.   DDimer No results for input(s): DDIMER in the last 168 hours.   Radiology    Dg Chest 2 View  Result Date: 12/25/2016 CLINICAL DATA:  Chest pain EXAM: CHEST  2 VIEW COMPARISON:  06/13/2014 FINDINGS: Cardiac shadows within normal limits. Postsurgical changes are again seen. The lungs are well aerated bilaterally. No focal infiltrate or sizable effusion is noted. No acute bony abnormality is seen. IMPRESSION: No acute abnormality noted. Electronically Signed   By: Inez Catalina M.D.   On: 12/25/2016 15:01    Cardiac Studies   Cath pending  Patient Profile     71 y.o. male history of CAD s/p CABG x4 (2000) and subsequent PCI, HLD, HTN and diet controlled DMT2 who presented to [facility] today with chest pain and found to have an elevated POC troponin.  Assessment & Plan    1. NSTEMI. History of CABG with stents placed in 2012. Plan for [facility] tomorrow. Will continue heparin. 2. HTN: BP well controlled. No changes 3. HLD: continue crestor, zetia  Signed, Will Meredith Leeds, MD  12/27/2016, 8:59 AM

## 2016-12-28 ENCOUNTER — Other Ambulatory Visit: Payer: Self-pay

## 2016-12-28 ENCOUNTER — Encounter (HOSPITAL_COMMUNITY)
Admission: EM | Disposition: A | Discharge status: Home / Self Care | Payer: Self-pay | Source: Home / Self Care | Attending: Cardiology

## 2016-12-28 DIAGNOSIS — I25810 Atherosclerosis of coronary artery bypass graft(s) without angina pectoris: Secondary | ICD-10-CM

## 2016-12-28 DIAGNOSIS — E785 Hyperlipidemia, unspecified: Secondary | ICD-10-CM

## 2016-12-28 HISTORY — PX: CORONARY STENT INTERVENTION: CATH118234

## 2016-12-28 HISTORY — PX: LEFT HEART CATH AND CORS/GRAFTS ANGIOGRAPHY: CATH118250

## 2016-12-28 LAB — CBC
HEMATOCRIT: 38.6 % — AB (ref 39.0–52.0)
HEMOGLOBIN: 13 g/dL (ref 13.0–17.0)
MCH: 30.2 pg (ref 26.0–34.0)
MCHC: 33.7 g/dL (ref 30.0–36.0)
MCV: 89.8 fL (ref 78.0–100.0)
Platelets: 135 10*3/uL — ABNORMAL LOW (ref 150–400)
RBC: 4.3 MIL/uL (ref 4.22–5.81)
RDW: 13.7 % (ref 11.5–15.5)
WBC: 5.5 10*3/uL (ref 4.0–10.5)

## 2016-12-28 LAB — GLUCOSE, CAPILLARY: GLUCOSE-CAPILLARY: 107 mg/dL — AB (ref 65–99)

## 2016-12-28 LAB — HEPARIN LEVEL (UNFRACTIONATED): Heparin Unfractionated: 0.45 IU/mL (ref 0.30–0.70)

## 2016-12-28 LAB — POCT ACTIVATED CLOTTING TIME: Activated Clotting Time: 379 seconds

## 2016-12-28 SURGERY — LEFT HEART CATH AND CORS/GRAFTS ANGIOGRAPHY
Anesthesia: LOCAL

## 2016-12-28 MED ORDER — SODIUM CHLORIDE 0.9 % IV SOLN: 250.0000 mL | INTRAVENOUS | Status: DC | PRN

## 2016-12-28 MED ORDER — LIDOCAINE HCL (PF) 1 % IJ SOLN
INTRAMUSCULAR | Status: DC | PRN
  Administered 2016-12-28: 15 mL

## 2016-12-28 MED ORDER — HYDRALAZINE HCL 20 MG/ML IJ SOLN: 10.0000 mg | INTRAMUSCULAR | Status: DC | PRN

## 2016-12-28 MED ORDER — ANGIOPLASTY BOOK
Freq: Once | Status: AC
  Administered 2016-12-28: 23:00:00
  Filled 2016-12-28: qty 1

## 2016-12-28 MED ORDER — SODIUM CHLORIDE 0.9% FLUSH
3.0000 mL | Freq: Two times a day (BID) | INTRAVENOUS | Status: DC
  Administered 2016-12-28 – 2016-12-29 (×2): 3 mL via INTRAVENOUS

## 2016-12-28 MED ORDER — IOPAMIDOL (ISOVUE-370) INJECTION 76%
INTRAVENOUS | Status: AC
  Filled 2016-12-28: qty 125

## 2016-12-28 MED ORDER — HYDRALAZINE HCL 20 MG/ML IJ SOLN: 5.0000 mg | INTRAMUSCULAR | Status: AC | PRN

## 2016-12-28 MED ORDER — GABAPENTIN 100 MG PO CAPS
200.0000 mg | ORAL_CAPSULE | Freq: Every evening | ORAL | Status: DC | PRN
  Administered 2016-12-28: 200 mg via ORAL
  Filled 2016-12-28: qty 2

## 2016-12-28 MED ORDER — IOPAMIDOL (ISOVUE-370) INJECTION 76%
INTRAVENOUS | Status: DC | PRN
  Administered 2016-12-28: 145 mL via INTRA_ARTERIAL

## 2016-12-28 MED ORDER — ASPIRIN 81 MG PO CHEW: 81.0000 mg | CHEWABLE_TABLET | Freq: Every day | ORAL | Status: DC

## 2016-12-28 MED ORDER — SODIUM CHLORIDE 0.9% FLUSH: 3.0000 mL | INTRAVENOUS | Status: DC | PRN

## 2016-12-28 MED ORDER — HEPARIN (PORCINE) IN NACL 2-0.9 UNIT/ML-% IJ SOLN
INTRAMUSCULAR | Status: AC | PRN
  Administered 2016-12-28: 1000 mL

## 2016-12-28 MED ORDER — SODIUM CHLORIDE 0.9 % IV SOLN
INTRAVENOUS | Status: AC | PRN
  Administered 2016-12-28: 1.75 mg/kg/h via INTRAVENOUS

## 2016-12-28 MED ORDER — LABETALOL HCL 5 MG/ML IV SOLN: 10.0000 mg | INTRAVENOUS | Status: AC | PRN

## 2016-12-28 MED ORDER — BIVALIRUDIN TRIFLUOROACETATE 250 MG IV SOLR
INTRAVENOUS | Status: AC
  Filled 2016-12-28: qty 250

## 2016-12-28 MED ORDER — TICAGRELOR 90 MG PO TABS
ORAL_TABLET | ORAL | Status: DC | PRN
  Administered 2016-12-28: 180 mg via ORAL

## 2016-12-28 MED ORDER — HEART ATTACK BOUNCING BOOK
Freq: Once | Status: AC
  Administered 2016-12-28: 23:00:00
  Filled 2016-12-28: qty 1

## 2016-12-28 MED ORDER — SODIUM CHLORIDE 0.9 % IV SOLN
1.7500 mg/kg/h | INTRAVENOUS | Status: DC
  Administered 2016-12-28: 19:00:00 1.75 mg/kg/h via INTRAVENOUS
  Filled 2016-12-28: qty 250

## 2016-12-28 MED ORDER — ONDANSETRON HCL 4 MG/2ML IJ SOLN: 4.0000 mg | Freq: Four times a day (QID) | INTRAMUSCULAR | Status: DC | PRN

## 2016-12-28 MED ORDER — TICAGRELOR 90 MG PO TABS
ORAL_TABLET | ORAL | Status: AC
  Filled 2016-12-28: qty 2

## 2016-12-28 MED ORDER — BIVALIRUDIN BOLUS VIA INFUSION - CUPID
INTRAVENOUS | Status: DC | PRN
  Administered 2016-12-28: 67.2 mg via INTRAVENOUS

## 2016-12-28 MED ORDER — TICAGRELOR 90 MG PO TABS
90.0000 mg | ORAL_TABLET | Freq: Two times a day (BID) | ORAL | Status: DC
  Administered 2016-12-29 (×2): 90 mg via ORAL
  Filled 2016-12-28 (×2): qty 1

## 2016-12-28 MED ORDER — SODIUM CHLORIDE 0.9 % IV SOLN
INTRAVENOUS | Status: AC
  Administered 2016-12-28: 23:00:00 via INTRAVENOUS

## 2016-12-28 MED ORDER — IOPAMIDOL (ISOVUE-370) INJECTION 76%
INTRAVENOUS | Status: AC
  Filled 2016-12-28: qty 100

## 2016-12-28 MED ORDER — HEPARIN (PORCINE) IN NACL 2-0.9 UNIT/ML-% IJ SOLN
INTRAMUSCULAR | Status: AC
  Filled 2016-12-28: qty 1000

## 2016-12-28 MED ORDER — ACETAMINOPHEN 325 MG PO TABS: 650.0000 mg | ORAL_TABLET | ORAL | Status: DC | PRN

## 2016-12-28 MED ORDER — LIDOCAINE HCL 1 % IJ SOLN
INTRAMUSCULAR | Status: AC
  Filled 2016-12-28: qty 20

## 2016-12-28 SURGICAL SUPPLY — 23 items
BALLN SAPPHIRE 2.5X15 (BALLOONS) ×2
BALLN ~~LOC~~ EMERGE MR 3.25X20 (BALLOONS) ×2
BALLOON SAPPHIRE 2.5X15 (BALLOONS) IMPLANT
BALLOON ~~LOC~~ EMERGE MR 3.25X20 (BALLOONS) IMPLANT
CATH INFINITI 5 FR RCB (CATHETERS) ×1 IMPLANT
CATH INFINITI MULTIPACK ST 5F (CATHETERS) ×1 IMPLANT
CATH LAUNCHER 6FR AL1 (CATHETERS) IMPLANT
CATHETER LAUNCHER 6FR AL1 (CATHETERS) ×2
CATHETER LAUNCHER 6FR RCB (CATHETERS) ×1 IMPLANT
DEVICE SPIDERFX EMB PROT 5MM (WIRE) ×2 IMPLANT
KIT ENCORE 26 ADVANTAGE (KITS) ×1 IMPLANT
KIT HEART LEFT (KITS) ×2 IMPLANT
PACK CARDIAC CATHETERIZATION (CUSTOM PROCEDURE TRAY) ×2 IMPLANT
SHEATH PINNACLE 5F 10CM (SHEATH) ×1 IMPLANT
SHEATH PINNACLE 6F 10CM (SHEATH) ×1 IMPLANT
STENT PROMUS PREM MR 4.0X16 (Permanent Stent) ×1 IMPLANT
STENT SYNERGY DES 3X12 (Permanent Stent) ×1 IMPLANT
STENT SYNERGY DES 3X24 (Permanent Stent) ×1 IMPLANT
SYR MEDRAD MARK V 150ML (SYRINGE) ×2 IMPLANT
TRANSDUCER W/STOPCOCK (MISCELLANEOUS) ×2 IMPLANT
TUBING CIL FLEX 10 FLL-RA (TUBING) ×2 IMPLANT
WIRE ASAHI PROWATER 180CM (WIRE) ×1 IMPLANT
WIRE EMERALD 3MM-J .035X150CM (WIRE) ×1 IMPLANT

## 2016-12-28 NOTE — Progress Notes (Signed)
ANTICOAGULATION CONSULT NOTE - Follow Up Consult  Pharmacy Consult for Heparin Indication: chest pain/ACS/NSTEMI  Allergies  Allergen Reactions  . Oxycodone-Acetaminophen     REACTION: RASH - tolerated morphine  Note : Taking oxycodone/APAP 5-325 postoperatively 12/14 without issue  . Darvocet [Propoxyphene N-Acetaminophen]   . Propoxyphene Hives    Patient Measurements: Height: 5\' 8"  (172.7 cm) Weight: 197 lb 8.5 oz (89.6 kg) IBW/kg (Calculated) : 68.4 Heparin Dosing Weight:  89.6 kg  Vital Signs: Temp: 98.2 F (36.8 C) (08/13 1149) Temp Source: Oral (08/13 1149) BP: 106/70 (08/13 1149) Pulse Rate: 48 (08/13 1149)  Labs:  Recent Labs  12/25/16 1443  12/25/16 2350 12/25/16 2355 12/26/16 0520 12/26/16 1053 12/27/16 0253 12/28/16 0317  HGB 14.1  --   --   --  13.0  --  12.8* 13.0  HCT 41.5  --   --   --  38.1*  --  37.5* 38.6*  PLT 165  --   --   --  150  --  140* 135*  LABPROT  --   --   --  15.4* 15.2  --   --   --   INR  --   --   --  1.22 1.19  --   --   --   HEPARINUNFRC  --   < > 0.72*  --  0.56 0.52 0.64 0.45  CREATININE 0.88  --   --   --  0.94  --   --   --   TROPONINI  --   --  0.43*  --  0.33* 0.23*  --   --   < > = values in this interval not displayed.  Estimated Creatinine Clearance: 80.7 mL/min (by C-G formula based on SCr of 0.94 mg/dL).  Assessment:  71 yr old male continues on IV heparin for ACS/NSTEMI.  Heparin level remains therapeutic (0.45) on 1100 units/hr.  Hgb stable, platelet count has trended down 165>>135.  On ASA 81 mg daily.  For cardiac cath later today.  Goal of Therapy:  Heparin level 0.3-0.7 units/ml Monitor platelets by anticoagulation protocol: Yes   Plan:   Continue heparin drip at 1100 units/hr.  Daily heparin level and CBC while on heparin.  Follow up post-cath.  Arty Baumgartner, Elsa Pager: [phone] 12/28/2016,12:16 PM

## 2016-12-28 NOTE — Interval H&P Note (Signed)
Cath Lab Visit (complete for each Cath Lab visit)  Clinical Evaluation Leading to the Procedure:   ACS: Yes.    Non-ACS:    Anginal Classification: CCS III  Anti-ischemic medical therapy: Minimal Therapy (1 class of medications)  Non-Invasive Test Results: No non-invasive testing performed  Prior CABG: Previous CABG      History and Physical Interval Note:  12/28/2016 4:33 PM  Kevin Webb  has presented today for surgery, with the diagnosis of cp  The various methods of treatment have been discussed with the patient and family. After consideration of risks, benefits and other options for treatment, the patient has consented to  Procedure(s): LEFT HEART CATH AND CORS/GRAFTS ANGIOGRAPHY (N/A) as a surgical intervention .  The patient's history has been reviewed, patient examined, no change in status, stable for surgery.  I have reviewed the patient's chart and labs.  Questions were answered to the patient's satisfaction.     Quay Burow

## 2016-12-28 NOTE — H&P (View-Only) (Signed)
Progress Note  Patient Name: Kevin Webb Date of Encounter: 12/28/2016  Primary Cardiologist:   Lendora Keys  Subjective   No chest or jaw pain  Inpatient Medications    Scheduled Meds: . amLODipine  5 mg Oral Daily  . [START ON 12/29/2016] aspirin  81 mg Oral Daily  . ezetimibe  10 mg Oral Daily  . isosorbide mononitrate  30 mg Oral Daily  . metoprolol tartrate  25 mg Oral BID  . pantoprazole  40 mg Oral Daily  . rosuvastatin  40 mg Oral Daily  . sodium chloride flush  3 mL Intravenous Q12H  . sodium chloride flush  3 mL Intravenous Q12H   Continuous Infusions: . sodium chloride    . sodium chloride    . sodium chloride    . heparin 1,100 Units/hr (12/28/16 0626)   PRN Meds: sodium chloride, sodium chloride, acetaminophen, nitroGLYCERIN, ondansetron (ZOFRAN) IV, sodium chloride flush, sodium chloride flush   Vital Signs    Vitals:   12/27/16 2258 12/28/16 0324 12/28/16 0400 12/28/16 0716  BP: 121/67 130/66  129/67  Pulse: 64 67  (!) 51  Resp:  (!) 21  19  Temp: 97.7 F (36.5 C) 98.1 F (36.7 C)  98 F (36.7 C)  TempSrc: Oral Oral  Oral  SpO2:  98%  98%  Weight:   197 lb 8.5 oz (89.6 kg)   Height:        Intake/Output Summary (Last 24 hours) at 12/28/16 0816 Last data filed at 12/28/16 0330  Gross per 24 hour  Intake           626.67 ml  Output              900 ml  Net          -273.33 ml   Filed Weights   12/26/16 0436 12/27/16 0500 12/28/16 0400  Weight: 198 lb (89.8 kg) 197 lb 1.6 oz (89.4 kg) 197 lb 8.5 oz (89.6 kg)    Telemetry    NSR with PACs - Personally Reviewed  ECG    NA- Personally Reviewed  Physical Exam   GEN: No acute distress.   Neck: No  JVD Cardiac: RRR, no murmurs, rubs, or gallops.  Respiratory: Clear  to auscultation bilaterally. GI: Soft, nontender, non-distended  MS: No  edema; No deformity. Neuro:  Nonfocal  Psych: Normal affect   Labs    Chemistry Recent Labs Lab 12/25/16 1443 12/26/16 0520  NA 137 139    K 3.7 4.0  CL 104 105  CO2 24 25  GLUCOSE 211* 125*  BUN 7 7  CREATININE 0.88 0.94  CALCIUM 9.0 8.8*  PROT  --  5.9*  ALBUMIN  --  3.4*  AST  --  29  ALT  --  22  ALKPHOS  --  37*  BILITOT  --  0.9  GFRNONAA >60 >60  GFRAA >60 >60  ANIONGAP 9 9     Hematology Recent Labs Lab 12/26/16 0520 12/27/16 0253 12/28/16 0317  WBC 5.3 6.4 5.5  RBC 4.24 4.19* 4.30  HGB 13.0 12.8* 13.0  HCT 38.1* 37.5* 38.6*  MCV 89.9 89.5 89.8  MCH 30.7 30.5 30.2  MCHC 34.1 34.1 33.7  RDW 13.9 13.7 13.7  PLT 150 140* 135*    Cardiac Enzymes Recent Labs Lab 12/25/16 2350 12/26/16 0520 12/26/16 1053  TROPONINI 0.43* 0.33* 0.23*    Recent Labs Lab 12/25/16 1500  TROPIPOC 0.23*     BNPNo results  for input(s): BNP, PROBNP in the last 168 hours.   DDimer No results for input(s): DDIMER in the last 168 hours.   Radiology    No results found.  Cardiac Studies   NA  Patient Profile     71 y.o. male with a history of CAD s/p CABG x4(2000) and subsequent PCI, HLD, HTN and diet controlled DMT2 who presented to [facility] with chest pain and found to have an elevated POC troponin.  Assessment & Plan    NSTEMI:  Cath today.   HTN:  BP well controlled.  Continue current therapy.   HLD:  LDL drawn this admission was 47.  Continue current therapy.  Excellent response to therapy.  Victorino Sparrow and Crestor.   DM:  A1C 6.1.  Continue current therapy.   Signed, Minus Breeding, MD  12/28/2016, 8:16 AM

## 2016-12-28 NOTE — Progress Notes (Signed)
Progress Note  Patient Name: Kevin Webb Date of Encounter: 12/28/2016  Primary Cardiologist:   Ranessa Kosta  Subjective   No chest or jaw pain  Inpatient Medications    Scheduled Meds: . amLODipine  5 mg Oral Daily  . [START ON 12/29/2016] aspirin  81 mg Oral Daily  . ezetimibe  10 mg Oral Daily  . isosorbide mononitrate  30 mg Oral Daily  . metoprolol tartrate  25 mg Oral BID  . pantoprazole  40 mg Oral Daily  . rosuvastatin  40 mg Oral Daily  . sodium chloride flush  3 mL Intravenous Q12H  . sodium chloride flush  3 mL Intravenous Q12H   Continuous Infusions: . sodium chloride    . sodium chloride    . sodium chloride    . heparin 1,100 Units/hr (12/28/16 0626)   PRN Meds: sodium chloride, sodium chloride, acetaminophen, nitroGLYCERIN, ondansetron (ZOFRAN) IV, sodium chloride flush, sodium chloride flush   Vital Signs    Vitals:   12/27/16 2258 12/28/16 0324 12/28/16 0400 12/28/16 0716  BP: 121/67 130/66  129/67  Pulse: 64 67  (!) 51  Resp:  (!) 21  19  Temp: 97.7 F (36.5 C) 98.1 F (36.7 C)  98 F (36.7 C)  TempSrc: Oral Oral  Oral  SpO2:  98%  98%  Weight:   197 lb 8.5 oz (89.6 kg)   Height:        Intake/Output Summary (Last 24 hours) at 12/28/16 0816 Last data filed at 12/28/16 0330  Gross per 24 hour  Intake           626.67 ml  Output              900 ml  Net          -273.33 ml   Filed Weights   12/26/16 0436 12/27/16 0500 12/28/16 0400  Weight: 198 lb (89.8 kg) 197 lb 1.6 oz (89.4 kg) 197 lb 8.5 oz (89.6 kg)    Telemetry    NSR with PACs - Personally Reviewed  ECG    NA- Personally Reviewed  Physical Exam   GEN: No acute distress.   Neck: No  JVD Cardiac: RRR, no murmurs, rubs, or gallops.  Respiratory: Clear  to auscultation bilaterally. GI: Soft, nontender, non-distended  MS: No  edema; No deformity. Neuro:  Nonfocal  Psych: Normal affect   Labs    Chemistry Recent Labs Lab 12/25/16 1443 12/26/16 0520  NA 137 139    K 3.7 4.0  CL 104 105  CO2 24 25  GLUCOSE 211* 125*  BUN 7 7  CREATININE 0.88 0.94  CALCIUM 9.0 8.8*  PROT  --  5.9*  ALBUMIN  --  3.4*  AST  --  29  ALT  --  22  ALKPHOS  --  37*  BILITOT  --  0.9  GFRNONAA >60 >60  GFRAA >60 >60  ANIONGAP 9 9     Hematology Recent Labs Lab 12/26/16 0520 12/27/16 0253 12/28/16 0317  WBC 5.3 6.4 5.5  RBC 4.24 4.19* 4.30  HGB 13.0 12.8* 13.0  HCT 38.1* 37.5* 38.6*  MCV 89.9 89.5 89.8  MCH 30.7 30.5 30.2  MCHC 34.1 34.1 33.7  RDW 13.9 13.7 13.7  PLT 150 140* 135*    Cardiac Enzymes Recent Labs Lab 12/25/16 2350 12/26/16 0520 12/26/16 1053  TROPONINI 0.43* 0.33* 0.23*    Recent Labs Lab 12/25/16 1500  TROPIPOC 0.23*     BNPNo results  for input(s): BNP, PROBNP in the last 168 hours.   DDimer No results for input(s): DDIMER in the last 168 hours.   Radiology    No results found.  Cardiac Studies   NA  Patient Profile     71 y.o. male with a history of CAD s/p CABG x4(2000) and subsequent PCI, HLD, HTN and diet controlled DMT2 who presented to [facility] with chest pain and found to have an elevated POC troponin.  Assessment & Plan    NSTEMI:  Cath today.   HTN:  BP well controlled.  Continue current therapy.   HLD:  LDL drawn this admission was 47.  Continue current therapy.  Excellent response to therapy.  Victorino Sparrow and Crestor.   DM:  A1C 6.1.  Continue current therapy.   Signed, Minus Breeding, MD  12/28/2016, 8:16 AM

## 2016-12-29 ENCOUNTER — Encounter (HOSPITAL_COMMUNITY): Payer: Self-pay | Admitting: Cardiovascular Disease

## 2016-12-29 LAB — GLUCOSE, CAPILLARY: Glucose-Capillary: 99 mg/dL (ref 65–99)

## 2016-12-29 LAB — CBC
HCT: 38.3 % — ABNORMAL LOW (ref 39.0–52.0)
HEMOGLOBIN: 12.9 g/dL — AB (ref 13.0–17.0)
MCH: 30.2 pg (ref 26.0–34.0)
MCHC: 33.7 g/dL (ref 30.0–36.0)
MCV: 89.7 fL (ref 78.0–100.0)
Platelets: 148 10*3/uL — ABNORMAL LOW (ref 150–400)
RBC: 4.27 MIL/uL (ref 4.22–5.81)
RDW: 13.7 % (ref 11.5–15.5)
WBC: 7 10*3/uL (ref 4.0–10.5)

## 2016-12-29 LAB — BASIC METABOLIC PANEL
ANION GAP: 7 (ref 5–15)
BUN: 7 mg/dL (ref 6–20)
CALCIUM: 8.7 mg/dL — AB (ref 8.9–10.3)
CHLORIDE: 107 mmol/L (ref 101–111)
CO2: 25 mmol/L (ref 22–32)
Creatinine, Ser: 0.87 mg/dL (ref 0.61–1.24)
GFR calc non Af Amer: >60 mL/min (ref >60)
GLUCOSE: 107 mg/dL — AB (ref 65–99)
POTASSIUM: 3.8 mmol/L (ref 3.5–5.1)
Sodium: 139 mmol/L (ref 135–145)

## 2016-12-29 MED ORDER — ISOSORBIDE MONONITRATE ER 30 MG PO TB24: 30.0000 mg | ORAL_TABLET | Freq: Every day | ORAL | 5 refills | Status: DC

## 2016-12-29 MED ORDER — TICAGRELOR 90 MG PO TABS: 90.0000 mg | ORAL_TABLET | Freq: Two times a day (BID) | ORAL | 11 refills | Status: DC

## 2016-12-29 MED ORDER — TICAGRELOR 90 MG PO TABS: 90.0000 mg | ORAL_TABLET | Freq: Two times a day (BID) | ORAL | 0 refills | Status: DC

## 2016-12-29 NOTE — Progress Notes (Signed)
Discharge instructions given to patient to include site care, medication list and what to take today, follow up appointments, and where to pick up new medications. Patient leaving with family member via car.

## 2016-12-29 NOTE — Care Management Note (Addendum)
Case Management Note  Patient Details  Name: Kevin Webb MRN: [id number] Date of Birth: 1945/12/13  Subjective/Objective:    From home pta indep, s/p coronary stent, will be on brilinta, NCM awaiting benefit check.  He ambulated 800 feet with cardiac rehab.  He has the 30 day savings card.  He will be going to [facility] in [place] to get the 30 day free.  They do have it in stock.  NCM will call him with the co pay amt when receive it from benefit check. His cell is [phone].     Per benefit check  He has no RX coverage,  NCM called patient and he states he will get insurance card and call me back. Patient gave me information for insurance, also NCM checked with [facility] in [place] at [phone] and they state patient has medication coverage.   Per benefit check, spoke with Debbie at [facility] [phone], ID number [id number],  No prior auth needed, Thirty month supply co pay is $30.00.               Action/Plan: NCM will follow for dc needs.  Expected Discharge Date:  12/28/16               Expected Discharge Plan:  Home/Self Care  In-House Referral:     Discharge planning Services  CM Consult  Post Acute Care Choice:    Choice offered to:     DME Arranged:    DME Agency:     HH Arranged:    HH Agency:     Status of Service:  Completed, signed off  If discussed at [employer] of Stay Meetings, dates discussed:    Additional Comments:  Zenon Mayo, RN 12/29/2016, 9:12 AM

## 2016-12-29 NOTE — Discharge Summary (Signed)
Discharge Summary    Patient ID: Kevin Webb,  MRN: [id number], DOB/AGE: May 04, 1946 71 y.o.  Admit date: 12/25/2016 Discharge date: 12/29/2016  Primary Care Provider: [facility] Primary Cardiologist: Dr. [name]  Discharge Diagnoses    Principal Problem:   NSTEMI (non-ST elevated myocardial infarction) [facility]) Active Problems:   Dyslipidemia   HTN (hypertension)   Coronary atherosclerosis of artery bypass graft   History of Present Illness     Kevin Webb is a 71 y.o. male with past medical history of CAD (s/p CABG x4 in 2000 with LIMA-LAD, reverse SVG-IM, reverse SVG-acute margin and reverse SVG-RCA, s/p BMS to SVG-RI in 2012), HLD, HTN and diet controlled DMT2 who presented to [facility] on 12/25/2016 for evaluation of chest pain.   He reported waking up the morning of his presentation with chest discomfort which he initially accredited to GERD. Later in the day, he developed chest tightness which radiated to the neck and resembled prior anginal symptoms, therefore he presented to the ED for further evaluation.   His POC Troponin was elevated to 0.23 but EKG showed no acute changes. He was started on Heparin and admitted with plans for a cardiac catheterization later this admission.   Hospital Course     Consultants: None  Over the weekend, he denied any recurrent symptoms. Troponin values peaked at 0.43. He went for a cardiac catheterization on 12/27/2016 which showed severe native disease but the LIMA-LAD was patent with 75% stenosis noted along the SVG-RCA and 90% stenosis along the SVG-RI. Successful PCI with DES placement was performed to both lesions. He tolerated the procedure well and no immediate complications were noted. He was started on DAPT with ASA and Brilinta along with being continued on BB and statin therapy.   The following morning, he denied any recurrent chest pain or dyspnea. Telemetry showed NSR with no ectopic events. Hgb and creatinine  remained stable. He was last examined by Dr. [name] and deemed stable for discharge. 10-day follow-up has been arranged. He was discharged home in good condition.   _____________  Discharge Vitals Blood pressure 134/80, pulse (!) 53, temperature 97.9 F (36.6 C), temperature source Oral, resp. rate 20, height 5\' 8"  (1.727 m), weight 197 lb 8.5 oz (89.6 kg), SpO2 100 %.  Filed Weights   12/26/16 0436 12/27/16 0500 12/28/16 0400  Weight: 198 lb (89.8 kg) 197 lb 1.6 oz (89.4 kg) 197 lb 8.5 oz (89.6 kg)    Labs & Radiologic Studies     CBC  Recent Labs  12/28/16 0317 12/29/16 0445  WBC 5.5 7.0  HGB 13.0 12.9*  HCT 38.6* 38.3*  MCV 89.8 89.7  PLT 135* 789*   Basic Metabolic Panel  Recent Labs  12/29/16 0445  NA 139  K 3.8  CL 107  CO2 25  GLUCOSE 107*  BUN 7  CREATININE 0.87  CALCIUM 8.7*   Liver Function Tests No results for input(s): AST, ALT, ALKPHOS, BILITOT, PROT, ALBUMIN in the last 72 hours. No results for input(s): LIPASE, AMYLASE in the last 72 hours. Cardiac Enzymes  Recent Labs  12/26/16 1053  TROPONINI 0.23*   BNP Invalid input(s): POCBNP D-Dimer No results for input(s): DDIMER in the last 72 hours. Hemoglobin A1C No results for input(s): HGBA1C in the last 72 hours. Fasting Lipid Panel No results for input(s): CHOL, HDL, LDLCALC, TRIG, CHOLHDL, LDLDIRECT in the last 72 hours. Thyroid Function Tests No results for input(s): TSH, T4TOTAL, T3FREE, THYROIDAB in  the last 72 hours.  Invalid input(s): FREET3  Dg Chest 2 View  Result Date: 12/25/2016 CLINICAL DATA:  Chest pain EXAM: CHEST  2 VIEW COMPARISON:  06/13/2014 FINDINGS: Cardiac shadows within normal limits. Postsurgical changes are again seen. The lungs are well aerated bilaterally. No focal infiltrate or sizable effusion is noted. No acute bony abnormality is seen. IMPRESSION: No acute abnormality noted. Electronically Signed   By: Inez Catalina M.D.   On: 12/25/2016 15:01      Diagnostic Studies/Procedures    Cardiac Catheterization: 12/28/2016  Prox RCA to Mid RCA lesion, 100 %stenosed.  SVG.  Ost LAD lesion, 100 %stenosed.  Ost Ramus lesion, 100 %stenosed.  LIMA and is normal in caliber.  SVG.  Prox Graft to Mid Graft lesion, 90 %stenosed.  Post intervention, there is a 0% residual stenosis.  A stent was successfully placed.  Mid Graft lesion, 75 %stenosed.  Post intervention, there is a 0% residual stenosis.  A stent was successfully placed.  The left ventricular systolic function is normal.  LV end diastolic pressure is normal.  The left ventricular ejection fraction is 55-65% by visual estimate.  IMPRESSION: Successful ramus intermedius branch SVG PCI and drug-eluting stenting for "in-stent restenosis using spider distal protection device along with RCA SVG PCI and drug-eluting stenting using distal protection as well with excellent angiographic results. Patient has normal LV function. He tolerated the procedure well. Angiomax will continue 4 hours of focal segmental be discontinued. The patient will be treated with dual antiplatelet therapy. The sheath will be removed and pressure held on the groin to achieve hemostasis. The patient will be hydrated overnight and discharged home in the morning. He left the lab in stable condition.   Disposition   Pt is being discharged home today in good condition.  Follow-up Plans & Appointments    Follow-up Information    Erma Heritage, PA-C Follow up on 01/08/2017.   Specialties:  Physician Assistant, Cardiology Why:  Cardiology Hospital Follow-Up on 01/08/2017 at 11:30AM with Dr. Rosezella Florida PA.  Contact information: [address] [place] [address] [phone]          Discharge Instructions    AMB Referral to Cardiac Rehabilitation - Phase II    Complete by:  As directed    Diagnosis:  NSTEMI   Amb Referral to Cardiac Rehabilitation    Complete by:  As  directed    Diagnosis:   NSTEMI Coronary Stents     Diet - low sodium heart healthy    Complete by:  As directed    Discharge instructions    Complete by:  As directed    PLEASE REMEMBER TO BRING ALL OF YOUR MEDICATIONS TO EACH OF YOUR FOLLOW-UP OFFICE VISITS.  PLEASE ATTEND ALL SCHEDULED FOLLOW-UP APPOINTMENTS.   Activity: Increase activity slowly as tolerated. You may shower, but no soaking baths (or swimming) for 1 week. No driving for 1 week. No lifting over 10 lbs for 2 weeks. No sexual activity for 2 weeks.   You May Return to Work: in 1 week (if applicable)  Wound Care: You may wash cath site gently with soap and water. Keep cath site clean and dry. If you notice pain, swelling, bleeding or pus at your cath site, please call [phone].      Discharge Medications     Medication List    TAKE these medications   amLODipine 5 MG tablet Commonly known as:  NORVASC Take 1 tablet (5 mg total) by  mouth daily.   aspirin 81 MG tablet Take 81 mg by mouth daily.   co-enzyme Q-10 30 MG capsule Take 30 mg by mouth daily.   ezetimibe 10 MG tablet Commonly known as:  ZETIA Take 1 tablet (10 mg total) by mouth daily.   Fish Oil 1000 MG Caps 1 tab po qd   gabapentin 100 MG capsule Commonly known as:  NEURONTIN Take 1-2 capsules (100-200 mg total) by mouth at bedtime as needed (RLS).   GINKGO BILOBA PO Take 1 capsule by mouth daily.   GRAPE SEED PO Take 1 capsule by mouth daily. Reported on 09/26/2015   isosorbide mononitrate 30 MG 24 hr tablet Commonly known as:  IMDUR Take 1 tablet (30 mg total) by mouth daily. Notes to patient:  DO NOT TAKE WITHIN 48 HOURS OF TAKING VIAGRA.    metoprolol tartrate 25 MG tablet Commonly known as:  LOPRESSOR Take 1 tablet (25 mg total) by mouth 2 (two) times daily. What changed:  when to take this   multivitamin tablet Take 1 tablet by mouth daily. Reported on 09/26/2015   nitroGLYCERIN 0.4 MG SL tablet Commonly known as:   NITROSTAT Place 1 tablet (0.4 mg total) under the tongue every 5 (five) minutes x 3 doses as needed.   pantoprazole 40 MG tablet Commonly known as:  PROTONIX Take 1 tablet (40 mg total) by mouth daily.   RABEprazole 20 MG tablet Commonly known as:  ACIPHEX Take 20 mg by mouth daily.   ramipril 10 MG capsule Commonly known as:  ALTACE Take 1 capsule (10 mg total) by mouth daily.   rosuvastatin 40 MG tablet Commonly known as:  CRESTOR Take 1 tablet (40 mg total) by mouth daily.   SAW PALMETTO PO Take 1 capsule by mouth daily.   SELENIUM PO Take 1 tablet by mouth daily. Reported on 09/26/2015   sildenafil 25 MG tablet Commonly known as:  VIAGRA Take 50 mg by mouth daily as needed for erectile dysfunction (per urology). Reported on 09/26/2015   ticagrelor 90 MG Tabs tablet Commonly known as:  BRILINTA Take 1 tablet (90 mg total) by mouth 2 (two) times daily.   valACYclovir 500 MG tablet Commonly known as:  VALTREX Take 1 tablet (500 mg total) by mouth as needed.   vitamin C 1000 MG tablet Take 1,000 mg by mouth daily. Reported on 09/26/2015   Vitamin D3 5000 units Caps Take by mouth daily.   vitamin E 100 UNIT capsule Take 100 Units by mouth daily.       Aspirin prescribed at discharge?  Yes High Intensity Statin Prescribed? (Lipitor 40-80mg  or Crestor 20-40mg ): Yes Beta Blocker Prescribed? Yes For EF 40% or less, Was ACEI/ARB Prescribed? Yes ADP Receptor Inhibitor Prescribed? (i.e. Plavix etc.-Includes Medically Managed Patients): Yes For EF <40%, Aldosterone Inhibitor Prescribed? No: EF > 40% Was EF assessed during THIS hospitalization? Yes Was Cardiac Rehab II ordered? (Included Medically managed Patients): Yes   Allergies Allergies  Allergen Reactions  . Oxycodone-Acetaminophen     REACTION: RASH - tolerated morphine  Note : Taking oxycodone/APAP 5-325 postoperatively 12/14 without issue  . Darvocet [Propoxyphene N-Acetaminophen]   . Propoxyphene Hives      Outstanding Labs/Studies   None  Duration of Discharge Encounter   Greater than 30 minutes including physician time.  Signed, Erma Heritage, PA-C 12/29/2016, 9:35 AM  Patient seen and examined.  Plan as discussed in my rounding note for today and outlined above. Minus Breeding  12/29/2016  9:36 AM

## 2016-12-29 NOTE — Progress Notes (Signed)
CARDIAC REHAB PHASE I   PRE:  Rate/Rhythm: 81 SR  BP:  Supine:   Sitting: 134/80  Standing:    SaO2:   MODE:  Ambulation: 800 ft   POST:  Rate/Rhythm: 82 SR  BP:  Supine:   Sitting: 164/98  Standing:    SaO2:  0810-0905 Pt walked 800 ft with steady gait. No CP. Tolerated well. MI education completed with pt and wife who voiced understanding. Discussed NTG use, MI restrictions, ex ed, heart healthy diet and CRP 2. Will refer to [facility] Phase 2.   Graylon Good, RN BSN  12/29/2016 9:02 AM

## 2016-12-29 NOTE — Progress Notes (Signed)
Offered Pt a bath. Pt stated that he is going home today and will take care of bath once he arrives home.

## 2016-12-29 NOTE — Progress Notes (Signed)
Site area: right groin  Site Prior to Removal:  Level 1  Pressure Applied For 25 MINUTES    Minutes Beginning at 2335  Manual:   Yes.    Patient Status During Pull:  Stable   Post Pull Groin Site:  Level 1  Post Pull Instructions Given:  Yes.    Post Pull Pulses Present:  Yes.    Dressing Applied:  Yes.    Comments:  Groin site instructions reinforced.  Teach back demonstrated.  Pt verbalized understanding.

## 2016-12-29 NOTE — Progress Notes (Signed)
Round completed, Pt express no needs or concerns at this time.

## 2016-12-29 NOTE — Progress Notes (Signed)
Progress Note  Patient Name: Kevin Webb Date of Encounter: 12/29/2016  Primary Cardiologist:   S[place] Moss  Subjective   No chest pain.  No SOB.   Inpatient Medications    Scheduled Meds: . amLODipine  5 mg Oral Daily  . aspirin  81 mg Oral Daily  . ezetimibe  10 mg Oral Daily  . isosorbide mononitrate  30 mg Oral Daily  . metoprolol tartrate  25 mg Oral BID  . pantoprazole  40 mg Oral Daily  . rosuvastatin  40 mg Oral Daily  . sodium chloride flush  3 mL Intravenous Q12H  . sodium chloride flush  3 mL Intravenous Q12H  . ticagrelor  90 mg Oral BID   Continuous Infusions: . sodium chloride    . sodium chloride 75 mL/hr at 12/28/16 2325  . sodium chloride     PRN Meds: sodium chloride, sodium chloride, acetaminophen, gabapentin, nitroGLYCERIN, ondansetron (ZOFRAN) IV, sodium chloride flush, sodium chloride flush   Vital Signs    Vitals:   12/29/16 0100 12/29/16 0200 12/29/16 0300 12/29/16 0400  BP: 122/63 131/64 (!) 110/47 (!) 104/53  Pulse: (!) 59 (!) 56 (!) 51 (!) 55  Resp: (!) 21 (!) 22 19 19   Temp:      TempSrc:      SpO2: 97% 96% 95% 96%  Weight:      Height:        Intake/Output Summary (Last 24 hours) at 12/29/16 0625 Last data filed at 12/29/16 0252  Gross per 24 hour  Intake           814.68 ml  Output             1000 ml  Net          -185.32 ml   Filed Weights   12/26/16 0436 12/27/16 0500 12/28/16 0400  Weight: 198 lb (89.8 kg) 197 lb 1.6 oz (89.4 kg) 197 lb 8.5 oz (89.6 kg)    Telemetry    NSR with PACs - Personally Reviewed  ECG    NSR rate 60, PACs, RBBB, no change from previous.  12/29/2016 - Personally Reviewed  Physical Exam   GEN: No  acute distress.   Neck: No  JVD Cardiac: RRR, no murmurs, rubs, or gallops.  Respiratory: Clear   to auscultation bilaterally. GI: Soft, nontender, non-distended, normal bowel sounds  MS:  No edema; No deformity.  Right femoral site without bleeding.  Mild bruising. Neuro:   Nonfocal    Psych: Oriented and appropriate    Labs    Chemistry  Recent Labs Lab 12/25/16 1443 12/26/16 0520 12/29/16 0445  NA 137 139 139  K 3.7 4.0 3.8  CL 104 105 107  CO2 24 25 25   GLUCOSE 211* 125* 107*  BUN 7 7 7   CREATININE 0.88 0.94 0.87  CALCIUM 9.0 8.8* 8.7*  PROT  --  5.9*  --   ALBUMIN  --  3.4*  --   AST  --  29  --   ALT  --  22  --   ALKPHOS  --  37*  --   BILITOT  --  0.9  --   GFRNONAA >60 >60 >60  GFRAA >60 >60 >60  ANIONGAP 9 9 7      Hematology  Recent Labs Lab 12/27/16 0253 12/28/16 0317 12/29/16 0445  WBC 6.4 5.5 7.0  RBC 4.19* 4.30 4.27  HGB 12.8* 13.0 12.9*  HCT 37.5* 38.6* 38.3*  MCV 89.5 89.8 89.7  MCH 30.5 30.2 30.2  MCHC 34.1 33.7 33.7  RDW 13.7 13.7 13.7  PLT 140* 135* 148*    Cardiac Enzymes  Recent Labs Lab 12/25/16 2350 12/26/16 0520 12/26/16 1053  TROPONINI 0.43* 0.33* 0.23*     Recent Labs Lab 12/25/16 1500  TROPIPOC 0.23*     BNPNo results for input(s): BNP, PROBNP in the last 168 hours.   DDimer No results for input(s): DDIMER in the last 168 hours.   Radiology    No results found.  Cardiac Studies   Cath:  12/28/16 Dominance: Right  Left Anterior Descending  Ost LAD lesion, 100% stenosed.  Ramus Intermedius  Ost Ramus lesion, 100% stenosed.  Right Coronary Artery  Prox RCA to Mid RCA lesion, 100% stenosed.  Graft Angiography  saphenous Graft to Dist RCA  SVG.  Mid Graft lesion, 75% stenosed.  Angioplasty: A stent was successfully placed.  There is no residual stenosis post intervention.  Free LIMA Graft to Dist LAD  LIMA and is normal in caliber.  saphenous Graft to Ramus  SVG.  Prox Graft to Mid Graft lesion, 90% stenosed. The lesion was previously treated.  Angioplasty: A stent was successfully placed.  There is no residual stenosis post intervention.      Patient Profile     71 y.o. male with a history of CAD s/p CABG x4(2000) and subsequent PCI, HLD, HTN and diet controlled DMT2 who  presented to [facility] with chest pain and found to have an elevated POC troponin.  Assessment & Plan    NSTEMI:  Status post stenting of SVG to RCA and RI.   Continue current meds.  OK to discharge home to day.   Meds as on MAR.   HTN:  BP controlled.  Continue current meds.     HLD:  LDL drawn this admission was 47.  Continue Zetia and Crestor  DM:  A1C 6.1.  Continue current meds.    Signed, Minus Breeding, MD  12/29/2016, 6:25 AM

## 2016-12-30 NOTE — Consult Note (Signed)
           [facility] CM Primary Care Navigator  12/30/2016  Kevin Webb 03/24/1946 [id number]   Went to seepatient at the bedside earlier todayto identify possible discharge needs but he was already discharged per staff report.   Patient was discharged home yesterday.  Primary care provider's officeis listed as doing transition of care.    For questions, please contact:  Dannielle Huh, BSN, RN- [facility] Primary Care Navigator  Telephone: [phone] [place]

## 2017-01-04 ENCOUNTER — Other Ambulatory Visit: Payer: Self-pay | Admitting: Internal Medicine

## 2017-01-07 NOTE — Progress Notes (Signed)
Cardiology Office Note    Date:  01/08/2017   ID:  Sergei, Delo 1945/09/02, MRN [id number]  PCP:  Colon Branch, MD  Cardiologist: Dr. [name]   Chief Complaint  Patient presents with  . Hospitalization Follow-up    recent NSTEMI    History of Present Illness:    AURELIUS GILDERSLEEVE is a 71 y.o. male with past medical history of CAD (s/p CABG x4in 2000 with LIMA-LAD, reverse SVG-IM, reverse SVG-acute marginal and reverse SVG-RCA, s/p BMS to SVG-RI in 2012), HLD, HTN and diet controlled DMT2 who presents to the office today for hospital follow-up.   He was recently admitted from 8/10- 12/29/2016 for evaluation of chest pain. He reported having recurrent chest pain the day of his presentation which was similar to prior anginal symptoms. Initial troponin was elevated to 0.23, therefore he was started on Heparin and and admitted for further observation. Cardiac enzymes peaked at 0.43 and he went for a cardiac catheterization on 12/28/2016 which showed severe native disease but the LIMA-LAD was patent with 75% stenosis noted along the SVG-RCA and 90% stenosis along the SVG-RI. Successful PCI with DES placement was performed to both lesions. He was started on DAPT with ASA and Brilinta along with being continued on BB and statin therapy.   In talking with the patient today, he reports doing well since his recent hospitalization. He denies any recurrent chest pain or dyspnea on exertion. He has been walking up to 30 minutes per day without any anginal symptoms. He denies any orthopnea, PND, or lower extremity edema.  He does report a recent flare of diverticulitis but says this cleared up with Flagyl and Metronidazole. He noticed one episode of hematochezia with this but no recurrent symptoms. He reports good compliance with ASA and Brilinta, denying missing any recent doses.   Past Medical History:  Diagnosis Date  . Acute cholecystitis 04/18/2013  . Adenomatous colon polyp 12/2004  . CAD  (coronary artery disease)    a. s/p CABG x4 in 2000 with LIMA-LAD, reverse SVG-IM, reverse SVG-acute margin and reverse SVG-RCA b. s/p BMS to SVG-RI in 2012 c. 12/2016: PCI/DES to SVG-RCA and PCI/DES to SVG-RI  . Diabetes mellitus without complication ([place]) 7/56/4332  . Diverticulitis 2008/2009  . GERD (gastroesophageal reflux disease)   . HTN (hypertension)   . Hyperglycemia   . Hyperlipidemia   . Impingement syndrome of left shoulder 2016  . Low testosterone    Dr [name], [place] ,[place]  . Osteoarthritis of right knee    With trace effusion  . Peyronie disease   . RBBB   . RLS (restless legs syndrome) 08/15/2013  . Sinus bradycardia   . Vitamin D deficiency     Past Surgical History:  Procedure Laterality Date  . CHOLECYSTECTOMY N/A 04/21/2013   Procedure: LAPAROSCOPIC CHOLECYSTECTOMY WITH INTRAOPERATIVE CHOLANGIOGRAM;  Surgeon: Haywood Lasso, MD;  Location: [facility];  Service: General;  Laterality: N/A;  . COLONOSCOPY W/ POLYPECTOMY  2004 & 2009    X 2; Dr Fuller Plan; due 2014  . CORONARY ANGIOPLASTY    . CORONARY ARTERY BYPASS GRAFT  04/1999   4 vessels  . CORONARY STENT INTERVENTION N/A 12/28/2016   Procedure: CORONARY STENT INTERVENTION;  Surgeon: Lorretta Harp, MD;  Location: [facility];  Service: Cardiovascular;  Laterality: N/A;  . CORONARY STENT PLACEMENT  2012   Plavix  . LAPAROSCOPIC CHOLECYSTECTOMY  04/21/2013   Dr Margot Chimes; acutecholecystitis with necrosis  . LEFT HEART CATH  AND CORS/GRAFTS ANGIOGRAPHY N/A 12/28/2016   Procedure: LEFT HEART CATH AND CORS/GRAFTS ANGIOGRAPHY;  Surgeon: Lorretta Harp, MD;  Location: [facility];  Service: Cardiovascular;  Laterality: N/A;  . VASECTOMY      Current Medications: Outpatient Medications Prior to Visit  Medication Sig Dispense Refill  . amLODipine (NORVASC) 5 MG tablet Take 1 tablet (5 mg total) by mouth daily. 180 tablet 3  . Ascorbic Acid (VITAMIN C) 1000 MG tablet Take 1,000 mg by mouth daily. Reported  on 09/26/2015    . aspirin 81 MG tablet Take 81 mg by mouth daily.    Marland Kitchen Bioflavonoid Products (GRAPE SEED PO) Take 1 capsule by mouth daily. Reported on 09/26/2015    . Cholecalciferol (VITAMIN D3) 5000 UNITS CAPS Take by mouth daily.    Marland Kitchen co-enzyme Q-10 30 MG capsule Take 30 mg by mouth daily.     Marland Kitchen ezetimibe (ZETIA) 10 MG tablet Take 1 tablet (10 mg total) by mouth daily. 30 tablet 6  . GINKGO BILOBA PO Take 1 capsule by mouth daily.    . isosorbide mononitrate (IMDUR) 30 MG 24 hr tablet Take 1 tablet (30 mg total) by mouth daily. 30 tablet 5  . Multiple Vitamin (MULTIVITAMIN) tablet Take 1 tablet by mouth daily. Reported on 09/26/2015    . nitroGLYCERIN (NITROSTAT) 0.4 MG SL tablet Place 1 tablet (0.4 mg total) under the tongue every 5 (five) minutes x 3 doses as needed. 25 tablet 5  . Omega-3 Fatty Acids (FISH OIL) 1000 MG CAPS 1 tab po qd     . pantoprazole (PROTONIX) 40 MG tablet Take 1 tablet (40 mg total) by mouth daily. 90 tablet 1  . RABEprazole (ACIPHEX) 20 MG tablet Take 20 mg by mouth daily.    . ramipril (ALTACE) 10 MG capsule Take 1 capsule (10 mg total) by mouth daily. 90 capsule 1  . rosuvastatin (CRESTOR) 40 MG tablet Take 1 tablet (40 mg total) by mouth daily. 90 tablet 1  . Saw Palmetto, Serenoa repens, (SAW PALMETTO PO) Take 1 capsule by mouth daily.    . SELENIUM PO Take 1 tablet by mouth daily. Reported on 09/26/2015    . sildenafil (VIAGRA) 25 MG tablet Take 50 mg by mouth daily as needed for erectile dysfunction (per urology). Reported on 09/26/2015    . ticagrelor (BRILINTA) 90 MG TABS tablet Take 1 tablet (90 mg total) by mouth 2 (two) times daily. 60 tablet 11  . valACYclovir (VALTREX) 500 MG tablet Take 1 tablet (500 mg total) by mouth as needed. 30 tablet 5  . vitamin E 100 UNIT capsule Take 100 Units by mouth daily.      Marland Kitchen gabapentin (NEURONTIN) 100 MG capsule Take 1-2 capsules (100-200 mg total) by mouth at bedtime as needed (RLS). (Patient not taking: Reported on  12/25/2016) 60 capsule 6  . metoprolol tartrate (LOPRESSOR) 25 MG tablet Take 1 tablet (25 mg total) by mouth 2 (two) times daily. (Patient taking differently: Take 25 mg by mouth daily. ) 180 tablet 3   No facility-administered medications prior to visit.      Allergies:   Oxycodone-acetaminophen; Darvocet [propoxyphene n-acetaminophen]; and Propoxyphene   Social History   Social History  . Marital status: Single    Spouse name: N/A  . Number of children: 1  . Years of education: N/A   Occupational History  . plumber      plumber   Social History Main Topics  . Smoking status: Never  Smoker  . Smokeless tobacco: Never Used  . Alcohol use 6.0 - 7.2 oz/week    10 - 12 Cans of beer per week     Comment: socially   . Drug use: No  . Sexual activity: Not Asked   Other Topics Concern  . None   Social History Narrative   Remarried 2014     Family History:  The patient's family history includes Aortic aneurysm (age of onset: 30) in his father; CVA in his mother; Cholecystitis in his sister; Coronary artery disease in his mother; Diabetes in his brother and unknown relative; Heart attack (age of onset: 3) in his father.   Review of Systems:   Please see the history of present illness.     General:  No chills, fever, night sweats or weight changes.  Cardiovascular:  No dyspnea on exertion, edema, orthopnea, palpitations, paroxysmal nocturnal dyspnea. Positive for chest pain (now resolved).  Dermatological: No rash, lesions/masses Respiratory: No cough, dyspnea Urologic: No hematuria, dysuria Abdominal:   No nausea, vomiting, diarrhea, bright red blood per rectum, melena, or hematemesis Neurologic:  No visual changes, wkns, changes in mental status.  All other systems reviewed and are otherwise negative except as noted above.   Physical Exam:    VS:  BP 116/82   Pulse 66   Ht 5\' 9"  (1.753 m)   Wt 202 lb 9.6 oz (91.9 kg)   BMI 29.92 kg/m    General: Well developed,  well nourished Caucasian male appearing in no acute distress. Head: Normocephalic, atraumatic, sclera non-icteric, no xanthomas, nares are without discharge.  Neck: No carotid bruits. JVD not elevated.  Lungs: Respirations regular and unlabored, without wheezes or rales.  Heart: Regular rate and rhythm. No S3 or S4.  No murmur, no rubs, or gallops appreciated. Abdomen: Soft, non-tender, non-distended with normoactive bowel sounds. No hepatomegaly. No rebound/guarding. No obvious abdominal masses. Msk:  Strength and tone appear normal for age. No joint deformities or effusions. Extremities: No clubbing or cyanosis. No edema.  Distal pedal pulses are 2+ bilaterally. Right groin site without ecchymosis or hematoma. Small, mobile lymph node palpated.  Neuro: Alert and oriented X 3. Moves all extremities spontaneously. No focal deficits noted. Psych:  Responds to questions appropriately with a normal affect. Skin: No rashes or lesions noted  Wt Readings from Last 3 Encounters:  01/08/17 202 lb 9.6 oz (91.9 kg)  12/28/16 197 lb 8.5 oz (89.6 kg)  09/28/16 214 lb (97.1 kg)     Studies/Labs Reviewed:   EKG: EKG is ordered today. The ekg ordered today demonstrates sinus rhythm with PAC's, HR 66, and known RBBB.   Recent Labs: 12/25/2016: TSH 3.622 12/26/2016: ALT 22 12/29/2016: BUN 7; Creatinine, Ser 0.87; Hemoglobin 12.9; Platelets 148; Potassium 3.8; Sodium 139   Lipid Panel    Component Value Date/Time   CHOL 118 12/26/2016 0520   TRIG 179 (H) 12/26/2016 0520   HDL 35 (L) 12/26/2016 0520   CHOLHDL 3.4 12/26/2016 0520   VLDL 36 12/26/2016 0520   LDLCALC 47 12/26/2016 0520   LDLDIRECT 91.0 03/30/2016 1144    Additional studies/ records that were reviewed today include:   Cardiac Catheterization: 12/28/2016  Prox RCA to Mid RCA lesion, 100 %stenosed.  SVG.  Ost LAD lesion, 100 %stenosed.  Ost Ramus lesion, 100 %stenosed.  LIMA and is normal in caliber.  SVG.  Prox Graft to  Mid Graft lesion, 90 %stenosed.  Post intervention, there is a 0% residual stenosis.  A stent  was successfully placed.  Mid Graft lesion, 75 %stenosed.  Post intervention, there is a 0% residual stenosis.  A stent was successfully placed.  The left ventricular systolic function is normal.  LV end diastolic pressure is normal.  The left ventricular ejection fraction is 55-65% by visual estimate.  IMPRESSION: Successful ramus intermedius branch SVG PCI and drug-eluting stenting for "in-stent restenosis using spider distal protection device along with RCA SVG PCI and drug-eluting stenting using distal protection as well with excellent angiographic results. Patient has normal LV function. He tolerated the procedure well. Angiomax will continue 4 hours of focal segmental be discontinued. The patient will be treated with dual antiplatelet therapy. The sheath will be removed and pressure held on the groin to achieve hemostasis. The patient will be hydrated overnight and discharged home in the morning. He left the lab in stable condition.  Assessment:    1. Non-ST elevation myocardial infarction (NSTEMI), subsequent episode of care ([place])   2. Atherosclerosis of coronary artery bypass graft of native heart without angina pectoris   3. Essential hypertension   4. Hyperlipidemia LDL goal <70   5. Gastroesophageal reflux disease without esophagitis      Plan:   In order of problems listed above:  1. Subsequent Episode of Care for NSTEMI/ CAD - he has known CAD, having undergone CABG x4 in 2000 with LIMA-LAD, reverse SVG-IM, reverse SVG-acute marginal and reverse SVG-RCA and BMS to SVG-RI in 2012. - he was recently admitted for evaluation of chest pain and found to have an NSTEMI. Cath showed severe native disease but the LIMA-LAD was patent with 75% stenosis noted along the SVG-RCA and 90% stenosis along the SVG-RI. Successful PCI with DES placement was performed to both lesions. - he has  been exercising for 30+ minutes per day and denies any recurrent anginal symptoms. Will continue on DAPT with ASA and Brilinta along with BB, Imdur and statin therapy.   2. HTN - BP is well-controlled at 116/82 during today's visit. - continue with Imdur 30mg  daily, Lopressor 25mg  BID, and Altace 10mg  daily.   3. HLD - Lipid Panel in 12/2016 showed total cholesterol of 118, HDL 35, and LDL 47. At goal with LDL being < 70. - continue Crestor 40mg  daily, Zetia, and Fish Oil.   4. GERD - continue Protonix 40mg  daily.   Medication Adjustments/Labs and Tests Ordered: Current medicines are reviewed at length with the patient today.  Concerns regarding medicines are outlined above.  Medication changes, Labs and Tests ordered today are listed in the Patient Instructions below. Patient Instructions  Medication Instructions: Your physician recommends that you continue on your current medications as directed. Please refer to the Current Medication list given to you today.  If you need a refill on your cardiac medications before your next appointment, please call your pharmacy.    Thank you for choosing [facility] at [facility]!!      Signed, Erma Heritage, PA-C  01/08/2017 4:37 PM    [facility], [place], [place]  [address] Phone: [phone]; Fax: [phone]  [address], [place], [place] [address] Phone: [phone]

## 2017-01-08 ENCOUNTER — Ambulatory Visit (INDEPENDENT_AMBULATORY_CARE_PROVIDER_SITE_OTHER): Payer: Medicare Other | Admitting: Student

## 2017-01-08 ENCOUNTER — Encounter: Payer: Self-pay | Admitting: Student

## 2017-01-08 VITALS — BP 116/82 | HR 66 | Ht 69.0 in | Wt 202.6 lb

## 2017-01-08 DIAGNOSIS — E785 Hyperlipidemia, unspecified: Secondary | ICD-10-CM

## 2017-01-08 DIAGNOSIS — I25810 Atherosclerosis of coronary artery bypass graft(s) without angina pectoris: Secondary | ICD-10-CM

## 2017-01-08 DIAGNOSIS — K219 Gastro-esophageal reflux disease without esophagitis: Secondary | ICD-10-CM | POA: Diagnosis not present

## 2017-01-08 DIAGNOSIS — I249 Acute ischemic heart disease, unspecified: Secondary | ICD-10-CM

## 2017-01-08 DIAGNOSIS — I10 Essential (primary) hypertension: Secondary | ICD-10-CM

## 2017-01-08 DIAGNOSIS — I214 Non-ST elevation (NSTEMI) myocardial infarction: Secondary | ICD-10-CM | POA: Diagnosis not present

## 2017-01-08 HISTORY — DX: Hyperlipidemia, unspecified: E78.5

## 2017-01-08 NOTE — Patient Instructions (Signed)
Medication Instructions: Your physician recommends that you continue on your current medications as directed. Please refer to the Current Medication list given to you today.  If you need a refill on your cardiac medications before your next appointment, please call your pharmacy.     Thank you for choosing Heart[facility] at [facility]!!

## 2017-01-25 ENCOUNTER — Other Ambulatory Visit: Payer: Self-pay | Admitting: Student

## 2017-01-25 NOTE — Progress Notes (Signed)
HPI The patient presents for evaluation of CAD.  He was recently admitted from 8/10- 12/29/2016 for evaluation of chest pain with elevated troponin.  Cardiac catheterization on 12/28/2016 demonstrated severe native disease but the LIMA-LAD was patent with 75% stenosis noted along the SVG-RCA and 90% stenosis along the SVG-RI. Successful PCI with DES placement was performed to both lesions. He was started on DAPT with ASA and Brilinta along with being continued on BB and statin therapy.  He returns for follow up.  He has been doing well since then.  The patient denies any new symptoms such as chest discomfort, neck or arm discomfort. There has been no new shortness of breath, PND or orthopnea. There have been no reported palpitations, presyncope or syncope.  Of note at the last appt he asked to stop his Imdur and he has not been taking this.  He is only taking the beta blocker once daily.   Allergies  Allergen Reactions  . Oxycodone-Acetaminophen     REACTION: RASH - tolerated morphine  Note : Taking oxycodone/APAP 5-325 postoperatively 12/14 without issue  . Darvocet [Propoxyphene N-Acetaminophen]   . Propoxyphene Hives    Current Outpatient Prescriptions  Medication Sig Dispense Refill  . amLODipine (NORVASC) 5 MG tablet Take 1 tablet (5 mg total) by mouth daily. 180 tablet 3  . Ascorbic Acid (VITAMIN C) 1000 MG tablet Take 1,000 mg by mouth daily. Reported on 09/26/2015    . aspirin 81 MG tablet Take 81 mg by mouth daily.    Marland Kitchen Bioflavonoid Products (GRAPE SEED PO) Take 1 capsule by mouth daily. Reported on 09/26/2015    . Cholecalciferol (VITAMIN D3) 5000 UNITS CAPS Take by mouth daily.    Marland Kitchen co-enzyme Q-10 30 MG capsule Take 30 mg by mouth daily.     Marland Kitchen ezetimibe (ZETIA) 10 MG tablet Take 1 tablet (10 mg total) by mouth daily. 30 tablet 6  . gabapentin (NEURONTIN) 100 MG capsule Take 100 mg by mouth 2 (two) times daily as needed.    Marland Kitchen GINKGO BILOBA PO Take 1 capsule by mouth daily.    .  Multiple Vitamin (MULTIVITAMIN) tablet Take 1 tablet by mouth daily. Reported on 09/26/2015    . nitroGLYCERIN (NITROSTAT) 0.4 MG SL tablet Place 1 tablet (0.4 mg total) under the tongue every 5 (five) minutes x 3 doses as needed. 25 tablet 5  . Omega-3 Fatty Acids (FISH OIL) 1000 MG CAPS 1 tab po qd     . pantoprazole (PROTONIX) 40 MG tablet Take 1 tablet (40 mg total) by mouth daily. 90 tablet 1  . RABEprazole (ACIPHEX) 20 MG tablet Take 20 mg by mouth daily.    . ramipril (ALTACE) 10 MG capsule Take 1 capsule (10 mg total) by mouth daily. 90 capsule 1  . rosuvastatin (CRESTOR) 40 MG tablet Take 1 tablet (40 mg total) by mouth daily. 90 tablet 1  . Saw Palmetto, Serenoa repens, (SAW PALMETTO PO) Take 1 capsule by mouth daily.    . SELENIUM PO Take 1 tablet by mouth daily. Reported on 09/26/2015    . sildenafil (VIAGRA) 25 MG tablet Take 50 mg by mouth daily as needed for erectile dysfunction (per urology). Reported on 09/26/2015    . ticagrelor (BRILINTA) 90 MG TABS tablet Take 1 tablet (90 mg total) by mouth 2 (two) times daily. 180 tablet 3  . valACYclovir (VALTREX) 500 MG tablet Take 1 tablet (500 mg total) by mouth as needed. 30 tablet 5  .  vitamin E 100 UNIT capsule Take 100 Units by mouth daily.      . metoprolol succinate (TOPROL XL) 25 MG 24 hr tablet Take 1 tablet (25 mg total) by mouth daily. 90 tablet 3   No current facility-administered medications for this visit.     Past Medical History:  Diagnosis Date  . Acute cholecystitis 04/18/2013  . Adenomatous colon polyp 12/2004  . CAD (coronary artery disease)    a. s/p CABG x4 in 2000 with LIMA-LAD, reverse SVG-IM, reverse SVG-acute margin and reverse SVG-RCA b. s/p BMS to SVG-RI in 2012 c. 12/2016: PCI/DES to SVG-RCA and PCI/DES to SVG-RI  . Diabetes mellitus without complication ([place]) 10/03/8414  . Diverticulitis 2008/2009  . GERD (gastroesophageal reflux disease)   . HTN (hypertension)   . Hyperglycemia   . Hyperlipidemia   .  Impingement syndrome of left shoulder 2016  . Low testosterone    Dr [name], [place] ,[place]  . Osteoarthritis of right knee    With trace effusion  . Peyronie disease   . RBBB   . RLS (restless legs syndrome) 08/15/2013  . Sinus bradycardia   . Vitamin D deficiency     Past Surgical History:  Procedure Laterality Date  . CHOLECYSTECTOMY N/A 04/21/2013   Procedure: LAPAROSCOPIC CHOLECYSTECTOMY WITH INTRAOPERATIVE CHOLANGIOGRAM;  Surgeon: Haywood Lasso, MD;  Location: [facility];  Service: General;  Laterality: N/A;  . COLONOSCOPY W/ POLYPECTOMY  2004 & 2009    X 2; Dr Fuller Plan; due 2014  . CORONARY ANGIOPLASTY    . CORONARY ARTERY BYPASS GRAFT  04/1999   4 vessels  . CORONARY STENT INTERVENTION N/A 12/28/2016   Procedure: CORONARY STENT INTERVENTION;  Surgeon: Lorretta Harp, MD;  Location: [facility];  Service: Cardiovascular;  Laterality: N/A;  . CORONARY STENT PLACEMENT  2012   Plavix  . LAPAROSCOPIC CHOLECYSTECTOMY  04/21/2013   Dr Margot Chimes; acutecholecystitis with necrosis  . LEFT HEART CATH AND CORS/GRAFTS ANGIOGRAPHY N/A 12/28/2016   Procedure: LEFT HEART CATH AND CORS/GRAFTS ANGIOGRAPHY;  Surgeon: Lorretta Harp, MD;  Location: [facility];  Service: Cardiovascular;  Laterality: N/A;  . VASECTOMY      ROS:   As stated in the HPI and negative for all other systems.    PHYSICAL EXAM BP (!) 149/92   Pulse 81   Ht 5\' 9"  (1.753 m)   Wt 202 lb 3.2 oz (91.7 kg)   BMI 29.86 kg/m   GENERAL:  Well appearing HEENT:  Pupils equal round and reactive, fundi not visualized, oral mucosa unremarkable NECK:  No jugular venous distention, waveform within normal limits, carotid upstroke brisk and symmetric, no bruits, no thyromegaly LYMPHATICS:  No cervical, inguinal adenopathy LUNGS:  Clear to auscultation bilaterally BACK:  No CVA tenderness CHEST:  Well healed sternotomy scar. HEART:  PMI not displaced or sustained,S1 and S2 within normal limits, no S3, no S4, no  clicks, no rubs, no murmurs ABD:  Flat, positive bowel sounds normal in frequency in pitch, no bruits, no rebound, no guarding, no midline pulsatile mass, no hepatomegaly, no splenomegaly EXT:  2 plus pulses throughout, no edema, no cyanosis no clubbing   Lab Results  Component Value Date   CHOL 118 12/26/2016   TRIG 179 (H) 12/26/2016   HDL 35 (L) 12/26/2016   LDLCALC 47 12/26/2016   LDLDIRECT 91.0 03/30/2016   Lab Results  Component Value Date   HGBA1C 6.1 (H) 12/25/2016     ASSESSMENT AND PLAN  Coronary  atherosclerosis of artery bypass graft -  He has no symptoms.  I will change to Toprol XL since he is only taking this once daily.   HYPERLIPIDEMIA -  LDL is at target.  Continue current therapy.   HYPERTENSION -  The blood pressure is at target. No change in medications is indicated. We will continue with therapeutic lifestyle changes (TLC).  I reviewed the home BP readings.

## 2017-01-26 ENCOUNTER — Ambulatory Visit (INDEPENDENT_AMBULATORY_CARE_PROVIDER_SITE_OTHER): Payer: Medicare Other | Admitting: Cardiology

## 2017-01-26 ENCOUNTER — Encounter: Payer: Self-pay | Admitting: Cardiology

## 2017-01-26 VITALS — BP 149/92 | HR 81 | Ht 69.0 in | Wt 202.2 lb

## 2017-01-26 DIAGNOSIS — E785 Hyperlipidemia, unspecified: Secondary | ICD-10-CM | POA: Diagnosis not present

## 2017-01-26 DIAGNOSIS — I200 Unstable angina: Secondary | ICD-10-CM

## 2017-01-26 DIAGNOSIS — I249 Acute ischemic heart disease, unspecified: Secondary | ICD-10-CM

## 2017-01-26 DIAGNOSIS — I25110 Atherosclerotic heart disease of native coronary artery with unstable angina pectoris: Secondary | ICD-10-CM | POA: Diagnosis not present

## 2017-01-26 DIAGNOSIS — I10 Essential (primary) hypertension: Secondary | ICD-10-CM

## 2017-01-26 MED ORDER — TICAGRELOR 90 MG PO TABS: 90.0000 mg | ORAL_TABLET | Freq: Two times a day (BID) | ORAL | 3 refills | Status: DC

## 2017-01-26 MED ORDER — METOPROLOL SUCCINATE ER 25 MG PO TB24: 25.0000 mg | ORAL_TABLET | Freq: Every day | ORAL | 3 refills | Status: DC

## 2017-01-26 NOTE — Patient Instructions (Signed)
Medication Instructions:  STOP Metoprolol Tartrate START Metoprolol Succinate 25 mg daily   If you need a refill on your cardiac medications before your next appointment, please call your pharmacy.  Labwork: None Ordered   Testing/Procedures: None Ordered  Follow-Up: Your physician wants you to follow-up in: 6 Months. You should receive a reminder letter in the mail two months in advance. If you do not receive a letter, please call our office [phone]    Thank you for choosing [facility] at [facility]!!

## 2017-01-27 ENCOUNTER — Encounter: Payer: Self-pay | Admitting: Cardiology

## 2017-01-28 DIAGNOSIS — H25813 Combined forms of age-related cataract, bilateral: Secondary | ICD-10-CM | POA: Diagnosis not present

## 2017-03-31 ENCOUNTER — Ambulatory Visit: Payer: Medicare Other | Admitting: Internal Medicine

## 2017-04-01 ENCOUNTER — Encounter: Payer: Self-pay | Admitting: Internal Medicine

## 2017-04-01 ENCOUNTER — Ambulatory Visit (INDEPENDENT_AMBULATORY_CARE_PROVIDER_SITE_OTHER): Payer: Medicare Other | Admitting: Internal Medicine

## 2017-04-01 VITALS — BP 128/80 | HR 69 | Temp 98.4°F | Ht 68.5 in | Wt 209.4 lb

## 2017-04-01 DIAGNOSIS — Z860100 Personal history of colon polyps, unspecified: Secondary | ICD-10-CM

## 2017-04-01 DIAGNOSIS — I10 Essential (primary) hypertension: Secondary | ICD-10-CM

## 2017-04-01 DIAGNOSIS — Z125 Encounter for screening for malignant neoplasm of prostate: Secondary | ICD-10-CM

## 2017-04-01 DIAGNOSIS — I2510 Atherosclerotic heart disease of native coronary artery without angina pectoris: Secondary | ICD-10-CM | POA: Diagnosis not present

## 2017-04-01 DIAGNOSIS — D649 Anemia, unspecified: Secondary | ICD-10-CM | POA: Diagnosis not present

## 2017-04-01 DIAGNOSIS — N400 Enlarged prostate without lower urinary tract symptoms: Secondary | ICD-10-CM

## 2017-04-01 DIAGNOSIS — Z1283 Encounter for screening for malignant neoplasm of skin: Secondary | ICD-10-CM

## 2017-04-01 DIAGNOSIS — Z23 Encounter for immunization: Secondary | ICD-10-CM

## 2017-04-01 DIAGNOSIS — Z13818 Encounter for screening for other digestive system disorders: Secondary | ICD-10-CM

## 2017-04-01 DIAGNOSIS — E559 Vitamin D deficiency, unspecified: Secondary | ICD-10-CM | POA: Diagnosis not present

## 2017-04-01 DIAGNOSIS — E785 Hyperlipidemia, unspecified: Secondary | ICD-10-CM | POA: Diagnosis not present

## 2017-04-01 DIAGNOSIS — Z1159 Encounter for screening for other viral diseases: Secondary | ICD-10-CM

## 2017-04-01 DIAGNOSIS — D696 Thrombocytopenia, unspecified: Secondary | ICD-10-CM | POA: Diagnosis not present

## 2017-04-01 LAB — BASIC METABOLIC PANEL
BUN: 13 mg/dL (ref 6–23)
CHLORIDE: 102 meq/L (ref 96–112)
CO2: 27 mEq/L (ref 19–32)
Calcium: 9.4 mg/dL (ref 8.4–10.5)
Creatinine, Ser: 1.07 mg/dL (ref 0.40–1.50)
GFR: 72.77 mL/min (ref >60.00)
Glucose, Bld: 153 mg/dL — ABNORMAL HIGH (ref 70–99)
POTASSIUM: 4.2 meq/L (ref 3.5–5.1)
SODIUM: 136 meq/L (ref 135–145)

## 2017-04-01 LAB — CBC WITH DIFFERENTIAL/PLATELET
BASOS ABS: 0 10*3/uL (ref 0.0–0.1)
Basophils Relative: 0.3 % (ref 0.0–3.0)
EOS PCT: 0.4 % (ref 0.0–5.0)
Eosinophils Absolute: 0 10*3/uL (ref 0.0–0.7)
HEMATOCRIT: 44.6 % (ref 39.0–52.0)
Hemoglobin: 14.8 g/dL (ref 13.0–17.0)
LYMPHS ABS: 1.3 10*3/uL (ref 0.7–4.0)
LYMPHS PCT: 18.8 % (ref 12.0–46.0)
MCHC: 33.1 g/dL (ref 30.0–36.0)
MCV: 92.9 fl (ref 78.0–100.0)
MONOS PCT: 7.6 % (ref 3.0–12.0)
Monocytes Absolute: 0.5 10*3/uL (ref 0.1–1.0)
NEUTROS ABS: 5 10*3/uL (ref 1.4–7.7)
Neutrophils Relative %: 72.9 % (ref 43.0–77.0)
PLATELETS: 182 10*3/uL (ref 150.0–400.0)
RBC: 4.81 Mil/uL (ref 4.22–5.81)
RDW: 13.7 % (ref 11.5–15.5)
WBC: 6.8 10*3/uL (ref 4.0–10.5)

## 2017-04-01 LAB — POCT URINALYSIS DIPSTICK
Bilirubin, UA: NEGATIVE
Blood, UA: NEGATIVE
GLUCOSE UA: NEGATIVE
LEUKOCYTES UA: NEGATIVE
NITRITE UA: NEGATIVE
Protein, UA: NEGATIVE
Spec Grav, UA: 1.02 (ref 1.010–1.025)
Urobilinogen, UA: 1 E.U./dL
pH, UA: 5.5 (ref 5.0–8.0)

## 2017-04-01 LAB — VITAMIN D 25 HYDROXY (VIT D DEFICIENCY, FRACTURES): VITD: 27.08 ng/mL — ABNORMAL LOW (ref 30.00–100.00)

## 2017-04-01 LAB — PSA: PSA: 1.3 ng/mL (ref 0.10–4.00)

## 2017-04-01 NOTE — Patient Instructions (Addendum)
1. We will check labs today blood counts, urine, PSA, hepatitis B/C, vitamin d  2. We will refer you to dermatology Dr. Nehemiah Massed  3. We will refer you to Dr. Rogers Blocker urology to get your prostate checked but will check a PSA today  4. Follow up in 3 months   Cholesterol Cholesterol is a fat. Your body needs a small amount of cholesterol. Cholesterol (plaque) may build up in your blood vessels (arteries). That makes you more likely to have a heart attack or stroke. You cannot feel your cholesterol level. Having a blood test is the only way to find out if your level is high. Keep your test results. Work with your doctor to keep your cholesterol at a good level. What do the results mean?  Total cholesterol is how much cholesterol is in your blood.  LDL is bad cholesterol. This is the type that can build up. Try to have low LDL.  HDL is good cholesterol. It cleans your blood vessels and carries LDL away. Try to have high HDL.  Triglycerides are fat that the body can store or burn for energy. What are good levels of cholesterol?  Total cholesterol below 200.  LDL below 100 is good for people who have health risks. LDL below 70 is good for people who have very high risks.  HDL above 40 is good. It is best to have HDL of 60 or higher.  Triglycerides below 150. How can I lower my cholesterol? Diet Follow your diet program as told by your doctor.  Choose fish, white meat chicken, or [place] that is roasted or baked. Try not to eat red meat, fried foods, sausage, or lunch meats.  Eat lots of fresh fruits and vegetables.  Choose whole grains, beans, pasta, potatoes, and cereals.  Choose olive oil, corn oil, or canola oil. Only use small amounts.  Try not to eat butter, mayonnaise, shortening, or palm kernel oils.  Try not to eat foods with trans fats.  Choose low-fat or nonfat dairy foods. ? Drink skim or nonfat milk. ? Eat low-fat or nonfat yogurt and cheeses. ? Try not to drink whole  milk or cream. ? Try not to eat ice cream, egg yolks, or full-fat cheeses.  Healthy desserts include angel food cake, ginger snaps, animal crackers, hard candy, popsicles, and low-fat or nonfat frozen yogurt. Try not to eat pastries, cakes, pies, and cookies.  Exercise Follow your exercise program as told by your doctor.  Be more active. Try gardening, walking, and taking the stairs.  Ask your doctor about ways that you can be more active.  Medicine  Take over-the-counter and prescription medicines only as told by your doctor.  This information is not intended to replace advice given to you by your health care provider. Make sure you discuss any questions you have with your health care provider. Document Released: 07/31/2008 Document Revised: 12/04/2015 Document Reviewed: 11/14/2015 Elsevier Interactive Patient Education  2017 [employer].

## 2017-04-02 ENCOUNTER — Encounter: Payer: Self-pay | Admitting: Internal Medicine

## 2017-04-02 LAB — IRON,TIBC AND FERRITIN PANEL
%SAT: 23 % (calc) (ref 15–60)
Ferritin: 153 ng/mL (ref 20–380)
Iron: 78 ug/dL (ref 50–180)
TIBC: 345 mcg/dL (calc) (ref 250–425)

## 2017-04-02 LAB — HEPATITIS B CORE ANTIBODY, TOTAL: Hep B Core Total Ab: NONREACTIVE

## 2017-04-02 LAB — HEPATITIS B SURFACE ANTIBODY, QUANTITATIVE: Hepatitis B-Post: <5 m[IU]/mL — ABNORMAL LOW (ref >=10)

## 2017-04-02 LAB — HEPATITIS B SURFACE ANTIGEN: HEP B S AG: NONREACTIVE

## 2017-04-02 LAB — HEPATITIS C ANTIBODY
HEP C AB: NONREACTIVE
SIGNAL TO CUT-OFF: 0.01 (ref <1.00)

## 2017-04-02 NOTE — Progress Notes (Addendum)
Chief Complaint  Patient presents with  . Establish Care   Pt presents to establish care  1. He sees Dr. [name] for CAD and heart disease last admitted 12/25/16 for PCI x 3 per pt and has seen cardiology 2 x since and no chest pain  2. Reports h/o BPH est with Dr. Rogers Blocker will send referral and check PSA today. Nocturia every 2-3 hours qhs    Review of Systems  Respiratory: Negative for shortness of breath.   Cardiovascular: Negative for chest pain.  Gastrointestinal: Negative for blood in stool.  Genitourinary:       +nocturia    Past Medical History:  Diagnosis Date  . Acute cholecystitis 04/18/2013  . Adenomatous colon polyp 12/2004  . Anemia   . BPH associated with nocturia   . CAD (coronary artery disease)    a. s/p CABG x4 in 2000 with LIMA-LAD, reverse SVG-IM, reverse SVG-acute margin and reverse SVG-RCA b. s/p BMS to SVG-RI in 2012 c. 12/2016: PCI/DES to SVG-RCA and PCI/DES to SVG-RI (Dr. [name])   . Diabetes mellitus without complication ([place]) 40/98/1191   recent A1C 6.1 12/25/16 controlled with diet and exercise   . Diverticulitis 2008/2009  . GERD (gastroesophageal reflux disease)    controlled as of 04/01/17   . HTN (hypertension)   . Hyperglycemia   . Hyperglycemia   . Hyperlipidemia   . Impingement syndrome of left shoulder 2016  . Low testosterone    Dr [name], [place] ,[place]  . Osteoarthritis of right knee    With trace effusion  . Peyronie disease   . RBBB   . RLS (restless legs syndrome) 08/15/2013  . Sinus bradycardia   . Thrombocytopenia ([place])   . Vitamin D deficiency   . Vitamin D deficiency    Past Surgical History:  Procedure Laterality Date  . COLONOSCOPY W/ POLYPECTOMY  2004 & 2009    X 2; Dr Fuller Plan; due 2014  . CORONARY ANGIOPLASTY    . CORONARY ARTERY BYPASS GRAFT  04/1999   4 vessels  . CORONARY STENT INTERVENTION N/A 12/28/2016   Performed by Lorretta Harp, MD at [facility]  . CORONARY STENT PLACEMENT  2012   Plavix  .  LAPAROSCOPIC CHOLECYSTECTOMY  04/21/2013   Dr Margot Chimes; acutecholecystitis with necrosis  . LAPAROSCOPIC CHOLECYSTECTOMY WITH INTRAOPERATIVE CHOLANGIOGRAM N/A 04/21/2013   Performed by Haywood Lasso, MD at [facility]  . LEFT HEART CATH AND CORS/GRAFTS ANGIOGRAPHY N/A 12/28/2016   Performed by Lorretta Harp, MD at [facility]  . VASECTOMY     Family History  Problem Relation Age of Onset  . Aortic aneurysm Father 29       ? abdominal  . Heart attack Father 60  . Diabetes Unknown        PGaunt  . Coronary artery disease Mother        CABG in 16s  . CVA Mother        cns bleed from warfarin  . Aneurysm Mother   . Diabetes Brother   . Cholecystitis Sister   . Cancer Neg Hx   . Colon cancer Neg Hx   . Prostate cancer Neg Hx    Social History   Socioeconomic History  . Marital status: Single    Spouse name: Not on file  . Number of children: 1  . Years of education: Not on file  . Highest education level: Not on file  Social Needs  . Financial resource strain:  Not on file  . Food insecurity - worry: Not on file  . Food insecurity - inability: Not on file  . Transportation needs - medical: Not on file  . Transportation needs - non-medical: Not on file  Occupational History  . Occupation: plumber     Comment: plumber  Tobacco Use  . Smoking status: Never Smoker  . Smokeless tobacco: Never Used  Substance and Sexual Activity  . Alcohol use: Yes    Alcohol/week: 6.0 - 7.2 oz    Types: 10 - 12 Cans of beer per week    Comment: socially   . Drug use: No  . Sexual activity: Not on file  Other Topics Concern  . Not on file  Social History Narrative   Remarried 2014   1 son age 40 as of 03/2017    Current Meds  Medication Sig  . amLODipine (NORVASC) 5 MG tablet Take 1 tablet (5 mg total) by mouth daily.  . Ascorbic Acid (VITAMIN C) 1000 MG tablet Take 1,000 mg by mouth daily. Reported on 09/26/2015  . aspirin 81 MG tablet Take 81 mg by mouth daily.  Marland Kitchen  Bioflavonoid Products (GRAPE SEED PO) Take 1 capsule by mouth daily. Reported on 09/26/2015  . Cholecalciferol (VITAMIN D3) 5000 UNITS CAPS Take by mouth daily.  Marland Kitchen co-enzyme Q-10 30 MG capsule Take 30 mg by mouth daily.   Marland Kitchen gabapentin (NEURONTIN) 100 MG capsule Take 100 mg by mouth 2 (two) times daily as needed.  Marland Kitchen GINKGO BILOBA PO Take 1 capsule by mouth daily.  . Multiple Vitamin (MULTIVITAMIN) tablet Take 1 tablet by mouth daily. Reported on 09/26/2015  . nitroGLYCERIN (NITROSTAT) 0.4 MG SL tablet Place 1 tablet (0.4 mg total) under the tongue every 5 (five) minutes x 3 doses as needed.  . Omega-3 Fatty Acids (FISH OIL) 1000 MG CAPS 1 tab po qd   . rosuvastatin (CRESTOR) 40 MG tablet Take 1 tablet (40 mg total) by mouth daily.  . Saw Palmetto, Serenoa repens, (SAW PALMETTO PO) Take 1 capsule by mouth daily.  . SELENIUM PO Take 1 tablet by mouth daily. Reported on 09/26/2015  . vitamin E 100 UNIT capsule Take 100 Units by mouth daily.     Allergies  Allergen Reactions  . Oxycodone-Acetaminophen     REACTION: RASH - tolerated morphine  Note : Taking oxycodone/APAP 5-325 postoperatively 12/14 without issue  . Darvocet [Propoxyphene N-Acetaminophen]   . Propoxyphene Hives   Vitals:   04/01/17 1305  Weight: 209 lb 6.4 oz (95 kg)  Height: 5' 8.5" (1.74 m)   Recent Results (from the past 2160 hour(s))  CBC with Differential/Platelet     Status: None   Collection Time: 04/01/17  2:08 PM  Result Value Ref Range   WBC 6.8 4.0 - 10.5 K/uL   RBC 4.81 4.22 - 5.81 Mil/uL   Hemoglobin 14.8 13.0 - 17.0 g/dL   HCT 44.6 39.0 - 52.0 %   MCV 92.9 78.0 - 100.0 fl   MCHC 33.1 30.0 - 36.0 g/dL   RDW 13.7 11.5 - 15.5 %   Platelets 182.0 150.0 - 400.0 K/uL   Neutrophils Relative % 72.9 43.0 - 77.0 %   Lymphocytes Relative 18.8 12.0 - 46.0 %   Monocytes Relative 7.6 3.0 - 12.0 %   Eosinophils Relative 0.4 0.0 - 5.0 %   Basophils Relative 0.3 0.0 - 3.0 %   Neutro Abs 5.0 1.4 - 7.7 K/uL   Lymphs Abs  1.3 0.7 -  4.0 K/uL   Monocytes Absolute 0.5 0.1 - 1.0 K/uL   Eosinophils Absolute 0.0 0.0 - 0.7 K/uL   Basophils Absolute 0.0 0.0 - 0.1 K/uL  Iron, TIBC and Ferritin Panel     Status: None   Collection Time: 04/01/17  2:08 PM  Result Value Ref Range   Iron 78 50 - 180 mcg/dL   TIBC 345 250 - 425 mcg/dL (calc)   %SAT 23 15 - 60 % (calc)   Ferritin 153 20 - 380 ng/mL  VITAMIN D 25 Hydroxy (Vit-D Deficiency, Fractures)     Status: Abnormal   Collection Time: 04/01/17  2:08 PM  Result Value Ref Range   VITD 27.08 (L) 30.00 - 100.00 ng/mL  Hepatitis C antibody     Status: None   Collection Time: 04/01/17  2:08 PM  Result Value Ref Range   Hepatitis C Ab NON-REACTIVE NON-REACTI   SIGNAL TO CUT-OFF 0.01 <1.00  Hepatitis B surface antigen     Status: None   Collection Time: 04/01/17  2:08 PM  Result Value Ref Range   Hepatitis B Surface Ag NON-REACTIVE NON-REACTI  Hepatitis B surface antibody     Status: Abnormal   Collection Time: 04/01/17  2:08 PM  Result Value Ref Range   Hepatitis B-Post <5 (L) > OR = 10 mIU/mL    Comment: . Patient does not have immunity to hepatitis B virus. . For additional information, please refer to http://education.questdiagnostics.com/faq/FAQ105 (This link is being provided for informational/ educational purposes only).   Hepatitis B Core Antibody, total     Status: None   Collection Time: 04/01/17  2:08 PM  Result Value Ref Range   Hep B Core Total Ab NON-REACTIVE NON-REACTI  Basic Metabolic Panel (BMET)     Status: Abnormal   Collection Time: 04/01/17  2:08 PM  Result Value Ref Range   Sodium 136 135 - 145 mEq/L   Potassium 4.2 3.5 - 5.1 mEq/L   Chloride 102 96 - 112 mEq/L   CO2 27 19 - 32 mEq/L   Glucose, Bld 153 (H) 70 - 99 mg/dL   BUN 13 6 - 23 mg/dL   Creatinine, Ser 1.07 0.40 - 1.50 mg/dL   Calcium 9.4 8.4 - 10.5 mg/dL   GFR 72.77 >60.00 mL/min  PSA     Status: None   Collection Time: 04/01/17  2:08 PM  Result Value Ref Range   PSA  1.30 0.10 - 4.00 ng/mL    Comment: Test performed using Access Hybritech PSA Assay, a parmagnetic partical, chemiluminecent immunoassay.  POCT urinalysis dipstick     Status: Abnormal   Collection Time: 04/01/17  2:22 PM  Result Value Ref Range   Color, UA yellow    Clarity, UA clear    Glucose, UA neg    Bilirubin, UA neg    Ketones, UA trace    Spec Grav, UA 1.020 1.010 - 1.025   Blood, UA neg    pH, UA 5.5 5.0 - 8.0   Protein, UA neg    Urobilinogen, UA 1.0 0.2 or 1.0 E.U./dL   Nitrite, UA neg    Leukocytes, UA Negative Negative   Vitals:   04/01/17 1305  BP: 128/80  Pulse: 69  Temp: 98.4 F (36.9 C)  SpO2: 96%   Objective  Physical Exam  Constitutional: He is oriented to person, place, and time and well-developed, well-nourished, and in no distress. Vital signs are normal. No distress.  HENT:  Head: Normocephalic and atraumatic.  Mouth/Throat: Oropharynx is clear and moist and mucous membranes are normal.  Eyes: Conjunctivae are normal. Pupils are equal, round, and reactive to light.  Cardiovascular: Normal rate, regular rhythm and normal heart sounds.  Pulmonary/Chest: Effort normal and breath sounds normal.  Abdominal: Soft. Bowel sounds are normal.  Neurological: He is alert and oriented to person, place, and time.  Skin: Skin is warm, dry and intact.  Psychiatric: Mood, memory, affect and judgment normal.  Nursing note and vitals reviewed.  Assessment   1. HTN controlled, HLD with elevated TGS 179 12/29/16  2. CAD s/p CABG and stents stable  3. Likely BPH with nocturia  4. Vit D def 5. H/o anemia and thrombocytopenia  6. HM  Plan  1. Cont meds on Zetia, crestor, aspirin 81 mg  Check BMET, UA today  2. Cont meds and f/u Dr. [name]  3. Refer to Dr. Rogers Blocker urology for DPE check PSA today. Consider tx for BPH with urology  4. Check vit D today  5. Repeat cbc anemia panel today  6.given flu shot today, zostavax ? If had 03/21/14, pna 23 vx ? If had 04/25/1013,  Tdap ? If had 08/07/08  Consider shingrix and hep B vaccine if not immune and prevnar If he has not had had  Check PSA today refer urology see # 3  Had eye exam Dr. Gloriann Loan ~01/2017  Never smoker no CT chest  Check Hep B/C status  US 04/2013 abdominal aorta normal  Colonoscopy had 05/02/15 Dr. Fuller Plan sessile polyp repeat 04/2020 per report   Refer derm annual skin check saw derm 05/03/17 noted SK, lentigos, rosacea, Aks face and arms f/u in 1 year [facility]. [place].  Last A1C 6.1 12/2016 repeat in future   Reviewed other labs from 12/2016   callled pharmacy to review meds  ? If pt taking norvasc he states no but pharm shows he is filling and last picked up 90 day supply 12/2016. Other meds and supplements confirmed. Pt needs to bring in all med bottles to f/u       Provider: Dr. Olivia Mackie [facility]

## 2017-04-06 DIAGNOSIS — R351 Nocturia: Secondary | ICD-10-CM | POA: Diagnosis not present

## 2017-04-06 DIAGNOSIS — R3915 Urgency of urination: Secondary | ICD-10-CM | POA: Diagnosis not present

## 2017-04-06 DIAGNOSIS — N401 Enlarged prostate with lower urinary tract symptoms: Secondary | ICD-10-CM | POA: Diagnosis not present

## 2017-04-06 DIAGNOSIS — R350 Frequency of micturition: Secondary | ICD-10-CM | POA: Diagnosis not present

## 2017-04-13 ENCOUNTER — Other Ambulatory Visit: Payer: Self-pay | Admitting: Internal Medicine

## 2017-04-13 DIAGNOSIS — E785 Hyperlipidemia, unspecified: Secondary | ICD-10-CM

## 2017-04-13 NOTE — Telephone Encounter (Signed)
Copied from [place] [id number]. Topic: Quick Communication - See Telephone Encounter >> Apr 13, 2017  4:58 PM Conception Chancy, NT wrote: CRM for notification. See Telephone encounter for:  04/13/17.  Pt family states they have called pharmacy 3 times and they are not hearing back from the office and pt is needing crestor refilled

## 2017-04-14 MED ORDER — ROSUVASTATIN CALCIUM 40 MG PO TABS: 40.0000 mg | ORAL_TABLET | Freq: Every day | ORAL | 1 refills | Status: DC

## 2017-04-14 NOTE — Telephone Encounter (Addendum)
Left voice mail to call back, HIPPA complaint to confirm  Per DPR for LuAnn .   Need to confirm which pharmacy they are using .

## 2017-04-14 NOTE — Telephone Encounter (Signed)
Please advise 

## 2017-04-14 NOTE — Telephone Encounter (Signed)
Transfer script from Dr Larose Kells  Last office visit 04/01/17 Next office visit

## 2017-04-28 ENCOUNTER — Emergency Department (HOSPITAL_COMMUNITY)
Admission: EM | Admit: 2017-04-28 | Discharge: 2017-04-28 | Disposition: A | Discharge status: Home / Self Care | Payer: Medicare Other | Attending: Emergency Medicine | Admitting: Emergency Medicine

## 2017-04-28 ENCOUNTER — Encounter (HOSPITAL_COMMUNITY): Payer: Self-pay | Admitting: *Deleted

## 2017-04-28 ENCOUNTER — Other Ambulatory Visit: Payer: Self-pay

## 2017-04-28 ENCOUNTER — Emergency Department (HOSPITAL_COMMUNITY): Payer: Medicare Other

## 2017-04-28 DIAGNOSIS — I10 Essential (primary) hypertension: Secondary | ICD-10-CM | POA: Diagnosis not present

## 2017-04-28 DIAGNOSIS — E119 Type 2 diabetes mellitus without complications: Secondary | ICD-10-CM | POA: Diagnosis not present

## 2017-04-28 DIAGNOSIS — I25720 Atherosclerosis of autologous artery coronary artery bypass graft(s) with unstable angina pectoris: Secondary | ICD-10-CM | POA: Diagnosis not present

## 2017-04-28 DIAGNOSIS — Z951 Presence of aortocoronary bypass graft: Secondary | ICD-10-CM | POA: Insufficient documentation

## 2017-04-28 DIAGNOSIS — I25709 Atherosclerosis of coronary artery bypass graft(s), unspecified, with unspecified angina pectoris: Secondary | ICD-10-CM | POA: Insufficient documentation

## 2017-04-28 DIAGNOSIS — Z79899 Other long term (current) drug therapy: Secondary | ICD-10-CM | POA: Diagnosis not present

## 2017-04-28 DIAGNOSIS — R079 Chest pain, unspecified: Secondary | ICD-10-CM | POA: Diagnosis not present

## 2017-04-28 DIAGNOSIS — Z955 Presence of coronary angioplasty implant and graft: Secondary | ICD-10-CM | POA: Diagnosis not present

## 2017-04-28 DIAGNOSIS — I2510 Atherosclerotic heart disease of native coronary artery without angina pectoris: Secondary | ICD-10-CM | POA: Insufficient documentation

## 2017-04-28 DIAGNOSIS — R072 Precordial pain: Secondary | ICD-10-CM

## 2017-04-28 DIAGNOSIS — Z7982 Long term (current) use of aspirin: Secondary | ICD-10-CM | POA: Diagnosis not present

## 2017-04-28 LAB — BASIC METABOLIC PANEL
Anion gap: 10 (ref 5–15)
BUN: 9 mg/dL (ref 6–20)
CALCIUM: 9.3 mg/dL (ref 8.9–10.3)
CO2: 24 mmol/L (ref 22–32)
CREATININE: 0.89 mg/dL (ref 0.61–1.24)
Chloride: 104 mmol/L (ref 101–111)
GFR calc non Af Amer: >60 mL/min (ref >60)
Glucose, Bld: 116 mg/dL — ABNORMAL HIGH (ref 65–99)
Potassium: 4.3 mmol/L (ref 3.5–5.1)
SODIUM: 138 mmol/L (ref 135–145)

## 2017-04-28 LAB — I-STAT TROPONIN, ED: TROPONIN I, POC: 0 ng/mL (ref 0.00–0.08)

## 2017-04-28 LAB — CBC
HCT: 45.5 % (ref 39.0–52.0)
Hemoglobin: 15 g/dL (ref 13.0–17.0)
MCH: 30.6 pg (ref 26.0–34.0)
MCHC: 33 g/dL (ref 30.0–36.0)
MCV: 92.9 fL (ref 78.0–100.0)
PLATELETS: 193 10*3/uL (ref 150–400)
RBC: 4.9 MIL/uL (ref 4.22–5.81)
RDW: 14.3 % (ref 11.5–15.5)
WBC: 9.8 10*3/uL (ref 4.0–10.5)

## 2017-04-28 LAB — TROPONIN I

## 2017-04-28 MED ORDER — ASPIRIN 81 MG PO CHEW
324.0000 mg | CHEWABLE_TABLET | Freq: Once | ORAL | Status: DC
  Filled 2017-04-28: qty 4

## 2017-04-28 NOTE — ED Triage Notes (Signed)
Pt c/o L sided CP onset x 2 days intermittent with SOB, onset with shoveling snow, pt c/o diaphoresis, n/v/d, with x 3 loose stools today, pt takes Byrlinta, pt hx of MI x 3 mths ago x 3 stents placed, A&O x4

## 2017-04-28 NOTE — ED Notes (Signed)
Patient denies pain and is resting comfortably.  

## 2017-04-28 NOTE — ED Provider Notes (Addendum)
[facility]PART[facility] Provider Note   CSN: [id number] Arrival date & time: 04/28/17  1093     History   Chief Complaint Chief Complaint  Patient presents with  . Chest Pain    HPI Kevin Webb is a 71 y.o. male.  HPI   71 year old male with past medical history as below, most notable for history of CABG as well as recent coronary artery disease status post PCI with stenting to RCA and SVG-RI here with intermittent chest pain.  The patient's states that over the last 2 days, he is noticed that he has become increasingly fatigued with exertion.  He has also noticed that after exerting himself, he feels fatigued with a dull, tooth ache-like sensation in his left neck.  This is similar to the pain that he felt with his previous acute coronary syndrome.  Earlier today, he developed recurrence of this pain after walking upstairs, but now was accompanied with diaphoresis and shortness of breath.  His symptoms have now resolved.  The symptoms do feel similar to his previous episodes as mentioned.  He denies any fevers or chills.  No cough.  He did recently have a URI but states he is improving from this.  No sputum production.  Denies any abdominal pain currently.  No weakness or numbness.  Past Medical History:  Diagnosis Date  . Acute cholecystitis 04/18/2013  . Adenomatous colon polyp 12/2004  . Anemia   . BPH associated with nocturia   . CAD (coronary artery disease)    a. s/p CABG x4 in 2000 with LIMA-LAD, reverse SVG-IM, reverse SVG-acute margin and reverse SVG-RCA b. s/p BMS to SVG-RI in 2012 c. 12/2016: PCI/DES to SVG-RCA and PCI/DES to SVG-RI (Dr. [name])   . Diabetes mellitus without complication ([place]) 23/55/7322   recent A1C 6.1 12/25/16 controlled with diet and exercise   . Diverticulitis 2008/2009  . GERD (gastroesophageal reflux disease)    controlled as of 04/01/17   . HTN (hypertension)   . Hyperglycemia   . Hyperglycemia   .  Hyperlipidemia   . Impingement syndrome of left shoulder 2016  . Low testosterone    Dr [name], [place] ,[place]  . Osteoarthritis of right knee    With trace effusion  . Peyronie disease   . RBBB   . RLS (restless legs syndrome) 08/15/2013  . Sinus bradycardia   . Thrombocytopenia ([place])   . Vitamin D deficiency   . Vitamin D deficiency     Patient Active Problem List   Diagnosis Date Noted  . Hyperlipidemia LDL goal <70 01/08/2017  . NSTEMI (non-ST elevated myocardial infarction) ([place]) 12/25/2016  . Diabetes mellitus without complication ([place]) 02/54/2706  . Follow-up--------PCP notes  01/27/2015  . Annual physical exam 04/25/2014  . RLS (restless legs syndrome) 08/15/2013  . Overweight(278.02) 11/28/2010  . DIVERTICULITIS, ACUTE 04/09/2010  . VITAMIN D DEFICIENCY 11/08/2009  . Dyslipidemia 08/07/2008  . GERD 02/22/2007  . TESTOSTERONE DEFICIENCY 09/30/2006  . HTN (hypertension) 09/30/2006  . Coronary atherosclerosis of artery bypass graft 09/30/2006  . COLONIC POLYPS, HX OF 09/30/2006    Past Surgical History:  Procedure Laterality Date  . CHOLECYSTECTOMY N/A 04/21/2013   Procedure: LAPAROSCOPIC CHOLECYSTECTOMY WITH INTRAOPERATIVE CHOLANGIOGRAM;  Surgeon: Haywood Lasso, MD;  Location: [facility];  Service: General;  Laterality: N/A;  . COLONOSCOPY W/ POLYPECTOMY  2004 & 2009    X 2; Dr Fuller Plan; due 2014  . CORONARY ANGIOPLASTY    . CORONARY ARTERY BYPASS GRAFT  04/1999  4 vessels  . CORONARY STENT INTERVENTION N/A 12/28/2016   Procedure: CORONARY STENT INTERVENTION;  Surgeon: Lorretta Harp, MD;  Location: [facility];  Service: Cardiovascular;  Laterality: N/A;  . CORONARY STENT PLACEMENT  2012   Plavix  . LAPAROSCOPIC CHOLECYSTECTOMY  04/21/2013   Dr Margot Chimes; acutecholecystitis with necrosis  . LEFT HEART CATH AND CORS/GRAFTS ANGIOGRAPHY N/A 12/28/2016   Procedure: LEFT HEART CATH AND CORS/GRAFTS ANGIOGRAPHY;  Surgeon: Lorretta Harp, MD;  Location: [facility];  Service: Cardiovascular;  Laterality: N/A;  . VASECTOMY         Home Medications    Prior to Admission medications   Medication Sig Start Date End Date Taking? Authorizing Provider  amLODipine (NORVASC) 5 MG tablet Take 1 tablet (5 mg total) by mouth daily. 03/31/16  Yes Minus Breeding, MD  Ascorbic Acid (VITAMIN C) 1000 MG tablet Take 1,000 mg by mouth daily. Reported on 09/26/2015   Yes [provider]  aspirin 81 MG tablet Take 81 mg by mouth daily.   Yes [provider]  Bioflavonoid Products (GRAPE SEED PO) Take 1 capsule by mouth daily. Reported on 09/26/2015   Yes [provider]  Cholecalciferol (VITAMIN D3) 5000 UNITS CAPS Take by mouth daily.   Yes [provider]  co-enzyme Q-10 30 MG capsule Take 30 mg by mouth daily.    Yes [provider]  gabapentin (NEURONTIN) 100 MG capsule Take 100 mg by mouth 2 (two) times daily as needed.   Yes [provider]  GINKGO BILOBA PO Take 1 capsule by mouth daily.   Yes [provider]  isosorbide mononitrate (IMDUR) 30 MG 24 hr tablet Take 30 mg by mouth daily.   Yes [provider]  metoprolol tartrate (LOPRESSOR) 25 MG tablet Take 25 mg by mouth daily.   Yes [provider]  milk thistle 175 MG tablet Take 175 mg by mouth daily.   Yes [provider]  Multiple Vitamin (MULTIVITAMIN) tablet Take 1 tablet by mouth daily. Reported on 09/26/2015   Yes [provider]  Omega-3 Fatty Acids (FISH OIL) 1000 MG CAPS 1 tab po qd    Yes [provider]  pantoprazole (PROTONIX) 40 MG tablet Take 40 mg daily by mouth.   Yes [provider]  rosuvastatin (CRESTOR) 40 MG tablet Take 1 tablet (40 mg total) by mouth at bedtime. 04/14/17  Yes McLean-Scocuzza, Nino Glow, MD  Saw Palmetto, Serenoa repens, (SAW PALMETTO PO) Take 1 capsule by mouth daily.   Yes [provider]  SELENIUM PO Take 1 tablet by mouth daily. Reported on  09/26/2015   Yes [provider]  ticagrelor (BRILINTA) 90 MG TABS tablet Take 90 mg 2 (two) times daily by mouth.   Yes [provider]  vitamin E 100 UNIT capsule Take 100 Units by mouth daily.     Yes [provider]  nitroGLYCERIN (NITROSTAT) 0.4 MG SL tablet Place 1 tablet (0.4 mg total) under the tongue every 5 (five) minutes x 3 doses as needed. 09/28/16   Colon Branch, MD    Family History Family History  Problem Relation Age of Onset  . Aortic aneurysm Father 27       ? abdominal  . Heart attack Father 56  . Diabetes Unknown        PGaunt  . Coronary artery disease Mother        CABG in 106s  . CVA Mother  cns bleed from warfarin  . Aneurysm Mother   . Diabetes Brother   . Cholecystitis Sister   . Cancer Neg Hx   . Colon cancer Neg Hx   . Prostate cancer Neg Hx     Social History Social History   Tobacco Use  . Smoking status: Never Smoker  . Smokeless tobacco: Never Used  Substance Use Topics  . Alcohol use: Yes    Alcohol/week: 6.0 - 7.2 oz    Types: 10 - 12 Cans of beer per week    Comment: socially   . Drug use: No     Allergies   Oxycodone-acetaminophen; Darvocet [propoxyphene n-acetaminophen]; and Propoxyphene   Review of Systems Review of Systems  Constitutional: Positive for diaphoresis.  Respiratory: Positive for chest tightness.   Cardiovascular: Positive for chest pain.  Neurological: Positive for weakness.  All other systems reviewed and are negative.    Physical Exam Updated Vital Signs BP 130/75   Pulse (!) 55   Temp 97.8 F (36.6 C) (Oral)   Resp 15   SpO2 91%   Physical Exam  Constitutional: He is oriented to person, place, and time. He appears well-developed and well-nourished. No distress.  HENT:  Head: Normocephalic and atraumatic.  Eyes: Conjunctivae are normal.  Neck: Neck supple.  Cardiovascular: Normal rate, regular rhythm and normal heart sounds. Exam reveals no friction rub.  No  murmur heard. Pulmonary/Chest: Effort normal and breath sounds normal. No respiratory distress. He has no wheezes. He has no rales.  Abdominal: He exhibits no distension.  Musculoskeletal: He exhibits no edema.  Neurological: He is alert and oriented to person, place, and time. He exhibits normal muscle tone.  Skin: Skin is warm. Capillary refill takes less than 2 seconds.  Psychiatric: He has a normal mood and affect.  Nursing note and vitals reviewed.    ED Treatments / Results  Labs (all labs ordered are listed, but only abnormal results are displayed) Labs Reviewed  BASIC METABOLIC PANEL - Abnormal; Notable for the following components:      Result Value   Glucose, Bld 116 (*)    All other components within normal limits  CBC  TROPONIN I  TROPONIN I  I-STAT TROPONIN, ED    EKG  EKG Interpretation  Date/Time:  Wednesday April 28 2017 09:21:00 EST Ventricular Rate:  62 PR Interval:  142 QRS Duration: 150 QT Interval:  448 QTC Calculation: 454 R Axis:   -75 Text Interpretation:  Sinus rhythm with Premature atrial complexes Left axis deviation Right bundle branch block Abnormal ECG Confirmed by Julianne Rice [phone]), editor Philomena Doheny [phone]) on 04/28/2017 9:34:53 AM       Radiology Dg Chest 2 View  Result Date: 04/28/2017 CLINICAL DATA:  Left-sided chest pain for 2 days. EXAM: CHEST  2 VIEW COMPARISON:  12/25/2016 FINDINGS: Normal heart size and stable aortic tortuosity. Status post CABG and coronary stenting. There is no edema, consolidation, effusion, or pneumothorax. Cholecystectomy clips. IMPRESSION: No evidence of active disease. Electronically Signed   By: Monte Fantasia M.D.   On: 04/28/2017 10:02    Procedures Procedures (including critical care time)  Medications Ordered in ED Medications - No data to display   Initial Impression / Assessment and Plan / ED Course  I have reviewed the triage vital signs and the nursing notes.  Pertinent labs  & imaging results that were available during my care of the patient were reviewed by me and considered in my medical decision making (see  chart for details).     71 yo M with h/o CAD sp CABG and recent PCI 8/18 here with story c/f worsening angina. EKG non-ischemic, initial labs are unremarkable. I consulted Cardiology for eval/possible admission. Pt's cardiologist, Dr. [name], has evaluated pt and they have had extensive discussion- plan is delta trop, and d/c if neg with good outpt follow-up. Pt has had no recurrence of CP here.  Trop neg. Per Cards, will d/c. I discussed strict return precautions in detail including any recurrence of pain, SOB, diaphoresis, nausea, or other concerning sx.  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

## 2017-04-28 NOTE — H&P (Deleted)
Cardiology Admission History and Physical:   Patient ID: Kevin Webb; MRN: [id number]; DOB: 1946-03-16   Admission date: 04/28/2017  Primary Care Provider: McLean-Scocuzza, Nino Glow, MD Primary Cardiologist: Minus Breeding, MD  Primary Electrophysiologist:  na  Chief Complaint:  Chest discomfort today and fatigue yesterday and today.  + diaphoresis     Patient Profile:   Kevin Webb is a 71 y.o. male with a history of CAD and CABG X 4 with NSTEMI 12/25/16 and PCI with DES to to VG to ramus intermedius for instent restenosis and PCI with DES to VG to RCA, HLD, HTN and diet controlled DM-2.    History of Present Illness:   Kevin Webb with above hx presents with chest discomfort  With his prior cardiac issues he did not have chest pain but diaphoresis and Jaw pain and may some chest discomfort.  He has been doing well since recent NSTEMI and stents in August.   With recent snow he shoveled snow and did well but by Tuesday he was fatigued.  He did continue to work.  Today after coming downstairs he became sweaty and had mild chest discomfort.  With prior history he came in just to be sure he was ok.   He does become mildly SOB after Brilinta but he drinks caffeine and this resolves.   EKG:  The ECG that was done in ER was personally reviewed and demonstrates SR with PACs RBBB and LAD but no acute changes.  Troponin poc 0.00 and troponin I <0.03 CXR with no acute disease,  Hgb 15, K+ 4.3 and Cr 0.89   Currently he has had ASA 324. No further complaints.  No discomfort now.  He would like to go home.     Past Medical History:  Diagnosis Date  . Acute cholecystitis 04/18/2013  . Adenomatous colon polyp 12/2004  . Anemia   . BPH associated with nocturia   . CAD (coronary artery disease)    a. s/p CABG x4 in 2000 with LIMA-LAD, reverse SVG-IM, reverse SVG-acute margin and reverse SVG-RCA b. s/p BMS to SVG-RI in 2012 c. 12/2016: PCI/DES to SVG-RCA and PCI/DES to SVG-RI (Dr. [name])   .  Diabetes mellitus without complication ([place]) 62/95/2841   recent A1C 6.1 12/25/16 controlled with diet and exercise   . Diverticulitis 2008/2009  . GERD (gastroesophageal reflux disease)    controlled as of 04/01/17   . HTN (hypertension)   . Hyperglycemia   . Hyperglycemia   . Hyperlipidemia   . Impingement syndrome of left shoulder 2016  . Low testosterone    Dr [name], [place] ,[place]  . Osteoarthritis of right knee    With trace effusion  . Peyronie disease   . RBBB   . RLS (restless legs syndrome) 08/15/2013  . Sinus bradycardia   . Thrombocytopenia ([place])   . Vitamin D deficiency   . Vitamin D deficiency     Past Surgical History:  Procedure Laterality Date  . CHOLECYSTECTOMY N/A 04/21/2013   Procedure: LAPAROSCOPIC CHOLECYSTECTOMY WITH INTRAOPERATIVE CHOLANGIOGRAM;  Surgeon: Haywood Lasso, MD;  Location: [facility];  Service: General;  Laterality: N/A;  . COLONOSCOPY W/ POLYPECTOMY  2004 & 2009    X 2; Dr Fuller Plan; due 2014  . CORONARY ANGIOPLASTY    . CORONARY ARTERY BYPASS GRAFT  04/1999   4 vessels  . CORONARY STENT INTERVENTION N/A 12/28/2016   Procedure: CORONARY STENT INTERVENTION;  Surgeon: Lorretta Harp, MD;  Location: [facility];  Service:  Cardiovascular;  Laterality: N/A;  . CORONARY STENT PLACEMENT  2012   Plavix  . LAPAROSCOPIC CHOLECYSTECTOMY  04/21/2013   Dr Margot Chimes; acutecholecystitis with necrosis  . LEFT HEART CATH AND CORS/GRAFTS ANGIOGRAPHY N/A 12/28/2016   Procedure: LEFT HEART CATH AND CORS/GRAFTS ANGIOGRAPHY;  Surgeon: Lorretta Harp, MD;  Location: [facility];  Service: Cardiovascular;  Laterality: N/A;  . VASECTOMY       Medications Prior to Admission: Prior to Admission medications   Medication Sig Start Date End Date Taking? Authorizing Provider  amLODipine (NORVASC) 5 MG tablet Take 1 tablet (5 mg total) by mouth daily. 03/31/16  Yes Minus Breeding, MD  Ascorbic Acid (VITAMIN C) 1000 MG tablet Take 1,000 mg by mouth daily.  Reported on 09/26/2015   Yes [provider]  aspirin 81 MG tablet Take 81 mg by mouth daily.   Yes [provider]  Bioflavonoid Products (GRAPE SEED PO) Take 1 capsule by mouth daily. Reported on 09/26/2015   Yes [provider]  Cholecalciferol (VITAMIN D3) 5000 UNITS CAPS Take by mouth daily.   Yes [provider]  co-enzyme Q-10 30 MG capsule Take 30 mg by mouth daily.    Yes [provider]  gabapentin (NEURONTIN) 100 MG capsule Take 100 mg by mouth 2 (two) times daily as needed.   Yes [provider]  GINKGO BILOBA PO Take 1 capsule by mouth daily.   Yes [provider]  isosorbide mononitrate (IMDUR) 30 MG 24 hr tablet Take 30 mg by mouth daily.   Yes [provider]  metoprolol tartrate (LOPRESSOR) 25 MG tablet Take 25 mg by mouth daily.   Yes [provider]  milk thistle 175 MG tablet Take 175 mg by mouth daily.   Yes [provider]  Multiple Vitamin (MULTIVITAMIN) tablet Take 1 tablet by mouth daily. Reported on 09/26/2015   Yes [provider]  Omega-3 Fatty Acids (FISH OIL) 1000 MG CAPS 1 tab po qd    Yes [provider]  pantoprazole (PROTONIX) 40 MG tablet Take 40 mg daily by mouth.   Yes [provider]  rosuvastatin (CRESTOR) 40 MG tablet Take 1 tablet (40 mg total) by mouth at bedtime. 04/14/17  Yes McLean-Scocuzza, Nino Glow, MD  Saw Palmetto, Serenoa repens, (SAW PALMETTO PO) Take 1 capsule by mouth daily.   Yes [provider]  SELENIUM PO Take 1 tablet by mouth daily. Reported on 09/26/2015   Yes [provider]  ticagrelor (BRILINTA) 90 MG TABS tablet Take 90 mg 2 (two) times daily by mouth.   Yes [provider]  vitamin E 100 UNIT capsule Take 100 Units by mouth daily.     Yes [provider]  nitroGLYCERIN (NITROSTAT) 0.4 MG SL tablet Place 1 tablet (0.4 mg total) under the tongue every 5 (five) minutes x 3 doses as needed.  09/28/16   Colon Branch, MD     Allergies:    Allergies  Allergen Reactions  . Oxycodone-Acetaminophen     REACTION: RASH - tolerated morphine  Note : Taking oxycodone/APAP 5-325 postoperatively 12/14 without issue  . Darvocet [Propoxyphene N-Acetaminophen]   . Propoxyphene Hives    Social History:   Social History   Socioeconomic History  . Marital status: Single    Spouse name: Not on file  . Number of children: 1  . Years of education: Not on file  . Highest education level: Not on file  Social Needs  . Financial  resource strain: Not on file  . Food insecurity - worry: Not on file  . Food insecurity - inability: Not on file  . Transportation needs - medical: Not on file  . Transportation needs - non-medical: Not on file  Occupational History  . Occupation: plumber     Comment: plumber  Tobacco Use  . Smoking status: Never Smoker  . Smokeless tobacco: Never Used  Substance and Sexual Activity  . Alcohol use: Yes    Alcohol/week: 6.0 - 7.2 oz    Types: 10 - 12 Cans of beer per week    Comment: socially   . Drug use: No  . Sexual activity: Not on file  Other Topics Concern  . Not on file  Social History Narrative   Remarried 2014   1 son age 42 as of 03/2017     Family History:   The patient's family history includes Aneurysm in his mother; Aortic aneurysm (age of onset: 31) in his father; CVA in his mother; Cholecystitis in his sister; Coronary artery disease in his mother; Diabetes in his brother and unknown relative; Heart attack (age of onset: 71) in his father. There is no history of Cancer, Colon cancer, or Prostate cancer.    ROS:  Please see the history of present illness.  General:no colds or fevers, no weight changes Skin:no rashes or ulcers HEENT:no blurred vision, no congestion CV:see HPI PUL:see HPI GI:no diarrhea constipation or melena, no indigestion GU:no hematuria, no dysuria MS:no joint pain, no claudication Neuro:no syncope, no  lightheadedness Endo:+ diabetes, no thyroid disease      Physical Exam/Data:   Vitals:   04/28/17 1015 04/28/17 1030 04/28/17 1201 04/28/17 1304  BP: (!) 158/88 (!) 151/96 133/88 117/80  Pulse: (!) 56 (!) 55 (!) 54 (!) 55  Resp: 15 13 18 18   Temp:      TempSrc:      SpO2: 94% 98% 95% 99%   No intake or output data in the 24 hours ending 04/28/17 1332 There were no vitals filed for this visit. There is no height or weight on file to calculate BMI.  General:  Well nourished, well developed, in no acute distress HEENT: normal Lymph: no adenopathy Neck: no JVD Endocrine:  No thryomegaly Vascular: No carotid bruits; has boots on but no swelling Cardiac:  normal S1, S2; RRR; no murmur, gallup rub or click  Lungs:  clear to auscultation bilaterally, no wheezing, rhonchi or rales  Abd: soft, nontender, no hepatomegaly  Ext: no lower ext edema Musculoskeletal:  No deformities, BUE and BLE strength normal and equal Skin: warm and dry  Neuro:  Alert and oriented X 3, MAE follows commands, no focal abnormalities noted Psych:  Normal affect though anxious   Relevant CV Studies: 12/28/16 Conclusion     Prox RCA to Mid RCA lesion, 100 %stenosed.  SVG.  Ost LAD lesion, 100 %stenosed.  Ost Ramus lesion, 100 %stenosed.  LIMA and is normal in caliber.  SVG.  Prox Graft to Mid Graft lesion, 90 %stenosed.  Post intervention, there is a 0% residual stenosis.  A stent was successfully placed.  Mid Graft lesion, 75 %stenosed.  Post intervention, there is a 0% residual stenosis.  A stent was successfully placed.  The left ventricular systolic function is normal.  LV end diastolic pressure is normal.  The left ventricular ejection fraction is 55-65% by visual estimate.    Successful ramus intermedius branch SVG PCI and drug-eluting stenting for "in-stent restenosis using spider distal  protection device along with RCA SVG PCI and drug-eluting stenting using distal  protection as well with excellent angiographic results. Patient has normal LV function. Coronary Diagrams   Diagnostic Diagram       Post-Intervention Diagram         Laboratory Data:  Chemistry Recent Labs  Lab 04/28/17 0928  NA 138  K 4.3  CL 104  CO2 24  GLUCOSE 116*  BUN 9  CREATININE 0.89  CALCIUM 9.3  GFRNONAA >60  GFRAA >60  ANIONGAP 10    No results for input(s): PROT, ALBUMIN, AST, ALT, ALKPHOS, BILITOT in the last 168 hours. Hematology Recent Labs  Lab 04/28/17 0928  WBC 9.8  RBC 4.90  HGB 15.0  HCT 45.5  MCV 92.9  MCH 30.6  MCHC 33.0  RDW 14.3  PLT 193   Cardiac Enzymes Recent Labs  Lab 04/28/17 1106  TROPONINI <0.03    Recent Labs  Lab 04/28/17 0945  TROPIPOC 0.00    BNPNo results for input(s): BNP, PROBNP in the last 168 hours.  DDimer No results for input(s): DDIMER in the last 168 hours.  Radiology/Studies:  Dg Chest 2 View  Result Date: 04/28/2017 CLINICAL DATA:  Left-sided chest pain for 2 days. EXAM: CHEST  2 VIEW COMPARISON:  12/25/2016 FINDINGS: Normal heart size and stable aortic tortuosity. Status post CABG and coronary stenting. There is no edema, consolidation, effusion, or pneumothorax. Cholecystectomy clips. IMPRESSION: No evidence of active disease. Electronically Signed   By: Monte Fantasia M.D.   On: 04/28/2017 10:02    Assessment and Plan:   1. Chest pain, with neg MI and + CAD with recent stents to VG ramus intermedius and VG to RCA pt continues with Brilinta with mild SOB relief with caffeine.  EKG without acute changes.  Troponin neg. Dr. [name] to see. 2. CAD with hx CABG and recent stents post NSTEMI. 3. HLD continue therapy 4. HTN treated was 153/96 on arrival.  Now 117/80.            Dr. [name] to see.  For questions or updates, please contact [place] Please consult [email or web address] for contact info under Cardiology/STEMI.    Signed, Cecilie Kicks, NP  04/28/2017 1:32 PM

## 2017-04-28 NOTE — Consult Note (Signed)
Expand All Collapse All       [] Hide copied text  [] Hover for details     CONSULT NOTE  CARDIOLOGY   Patient ID: Kevin Webb; MRN: [id number]; DOB: 08-18-1945   Admission date: 04/28/2017  Primary Care Provider: McLean-Scocuzza, Nino Glow, MD Primary Cardiologist: Minus Breeding, MD  Primary Electrophysiologist:  na  Chief Complaint:  Chest discomfort today and fatigue yesterday and today.  + diaphoresis     Patient Profile:   Kevin Webb is a 71 y.o. male with a history of CAD and CABG X 4 with NSTEMI 12/25/16 and PCI with DES to to VG to ramus intermedius for instent restenosis and PCI with DES to VG to RCA, HLD, HTN and diet controlled DM-2.    History of Present Illness:   [name] with above hx presents with chest discomfort  With his prior cardiac issues he did not have chest pain but diaphoresis and Jaw pain and may some chest discomfort.  He has been doing well since recent NSTEMI and stents in August.   With recent snow he shoveled snow and did well but by Tuesday he was fatigued.  He did continue to work.  Today after coming downstairs he became sweaty and had mild chest discomfort.  With prior history he came in just to be sure he was ok.   He does become mildly SOB after Brilinta but he drinks caffeine and this resolves.   EKG:  The ECG that was done in ER was personally reviewed and demonstrates SR with PACs RBBB and LAD but no acute changes.  Troponin poc 0.00 and troponin I <0.03 CXR with no acute disease,  Hgb 15, K+ 4.3 and Cr 0.89   Currently he has had ASA 324. No further complaints.  No discomfort now.  He would like to go home.         Past Medical History:  Diagnosis Date  . Acute cholecystitis 04/18/2013  . Adenomatous colon polyp 12/2004  . Anemia   . BPH associated with nocturia   . CAD (coronary artery disease)    a. s/p CABG x4 in 2000 with LIMA-LAD, reverse SVG-IM, reverse SVG-acute margin and reverse SVG-RCA b. s/p BMS to  SVG-RI in 2012 c. 12/2016: PCI/DES to SVG-RCA and PCI/DES to SVG-RI (Dr. [name])   . Diabetes mellitus without complication ([place]) 89/38/1017   recent A1C 6.1 12/25/16 controlled with diet and exercise   . Diverticulitis 2008/2009  . GERD (gastroesophageal reflux disease)    controlled as of 04/01/17   . HTN (hypertension)   . Hyperglycemia   . Hyperglycemia   . Hyperlipidemia   . Impingement syndrome of left shoulder 2016  . Low testosterone    Dr [name], [place] ,[place]  . Osteoarthritis of right knee    With trace effusion  . Peyronie disease   . RBBB   . RLS (restless legs syndrome) 08/15/2013  . Sinus bradycardia   . Thrombocytopenia ([place])   . Vitamin D deficiency   . Vitamin D deficiency                Past Surgical History:  Procedure Laterality Date  . CHOLECYSTECTOMY N/A 04/21/2013   Procedure: LAPAROSCOPIC CHOLECYSTECTOMY WITH INTRAOPERATIVE CHOLANGIOGRAM;  Surgeon: Haywood Lasso, MD;  Location: [facility];  Service: General;  Laterality: N/A;  . COLONOSCOPY W/ POLYPECTOMY  2004 & 2009    X 2; Dr Fuller Plan; due 2014  . CORONARY ANGIOPLASTY    . CORONARY ARTERY  BYPASS GRAFT  04/1999   4 vessels  . CORONARY STENT INTERVENTION N/A 12/28/2016   Procedure: CORONARY STENT INTERVENTION;  Surgeon: Lorretta Harp, MD;  Location: [facility];  Service: Cardiovascular;  Laterality: N/A;  . CORONARY STENT PLACEMENT  2012   Plavix  . LAPAROSCOPIC CHOLECYSTECTOMY  04/21/2013   Dr Margot Chimes; acutecholecystitis with necrosis  . LEFT HEART CATH AND CORS/GRAFTS ANGIOGRAPHY N/A 12/28/2016   Procedure: LEFT HEART CATH AND CORS/GRAFTS ANGIOGRAPHY;  Surgeon: Lorretta Harp, MD;  Location: [facility];  Service: Cardiovascular;  Laterality: N/A;  . VASECTOMY       Medications Prior to Admission:        Prior to Admission medications   Medication Sig Start Date End Date Taking? Authorizing Provider  amLODipine (NORVASC) 5 MG tablet  Take 1 tablet (5 mg total) by mouth daily. 03/31/16  Yes Minus Breeding, MD  Ascorbic Acid (VITAMIN C) 1000 MG tablet Take 1,000 mg by mouth daily. Reported on 09/26/2015   Yes [provider]  aspirin 81 MG tablet Take 81 mg by mouth daily.   Yes [provider]  Bioflavonoid Products (GRAPE SEED PO) Take 1 capsule by mouth daily. Reported on 09/26/2015   Yes [provider]  Cholecalciferol (VITAMIN D3) 5000 UNITS CAPS Take by mouth daily.   Yes [provider]  co-enzyme Q-10 30 MG capsule Take 30 mg by mouth daily.    Yes [provider]  gabapentin (NEURONTIN) 100 MG capsule Take 100 mg by mouth 2 (two) times daily as needed.   Yes [provider]  GINKGO BILOBA PO Take 1 capsule by mouth daily.   Yes [provider]  isosorbide mononitrate (IMDUR) 30 MG 24 hr tablet Take 30 mg by mouth daily.   Yes [provider]  metoprolol tartrate (LOPRESSOR) 25 MG tablet Take 25 mg by mouth daily.   Yes [provider]  milk thistle 175 MG tablet Take 175 mg by mouth daily.   Yes [provider]  Multiple Vitamin (MULTIVITAMIN) tablet Take 1 tablet by mouth daily. Reported on 09/26/2015   Yes [provider]  Omega-3 Fatty Acids (FISH OIL) 1000 MG CAPS 1 tab po qd    Yes [provider]  pantoprazole (PROTONIX) 40 MG tablet Take 40 mg daily by mouth.   Yes [provider]  rosuvastatin (CRESTOR) 40 MG tablet Take 1 tablet (40 mg total) by mouth at bedtime. 04/14/17  Yes McLean-Scocuzza, Nino Glow, MD  Saw Palmetto, Serenoa repens, (SAW PALMETTO PO) Take 1 capsule by mouth daily.   Yes [provider]  SELENIUM PO Take 1 tablet by mouth daily. Reported on 09/26/2015   Yes [provider]  ticagrelor (BRILINTA) 90 MG TABS tablet Take 90 mg 2 (two) times daily by mouth.   Yes [provider]  vitamin E 100 UNIT capsule Take 100 Units by  mouth daily.     Yes [provider]  nitroGLYCERIN (NITROSTAT) 0.4 MG SL tablet Place 1 tablet (0.4 mg total) under the tongue every 5 (five) minutes x 3 doses as needed. 09/28/16   Colon Branch, MD     Allergies:         Allergies  Allergen Reactions  . Oxycodone-Acetaminophen     REACTION: RASH - tolerated morphine  Note : Taking oxycodone/APAP 5-325 postoperatively 12/14 without issue  . Darvocet [Propoxyphene N-Acetaminophen]   . Propoxyphene Hives    Social History:  Social History        Socioeconomic History  . Marital status: Single    Spouse name: Not on file  . Number of children: 1  . Years of education: Not on file  . Highest education level: Not on file  Social Needs  . Financial resource strain: Not on file  . Food insecurity - worry: Not on file  . Food insecurity - inability: Not on file  . Transportation needs - medical: Not on file  . Transportation needs - non-medical: Not on file  Occupational History  . Occupation: plumber     Comment: plumber  Tobacco Use  . Smoking status: Never Smoker  . Smokeless tobacco: Never Used  Substance and Sexual Activity  . Alcohol use: Yes    Alcohol/week: 6.0 - 7.2 oz    Types: 10 - 12 Cans of beer per week    Comment: socially   . Drug use: No  . Sexual activity: Not on file  Other Topics Concern  . Not on file  Social History Narrative   Remarried 2014   1 son age 70 as of 03/2017     Family History:   The patient's family history includes Aneurysm in his mother; Aortic aneurysm (age of onset: 41) in his father; CVA in his mother; Cholecystitis in his sister; Coronary artery disease in his mother; Diabetes in his brother and unknown relative; Heart attack (age of onset: 48) in his father. There is no history of Cancer, Colon cancer, or Prostate cancer.    ROS:  Please see the history of present illness.  General:no colds or fevers, no weight changes Skin:no rashes or  ulcers HEENT:no blurred vision, no congestion CV:see HPI PUL:see HPI GI:no diarrhea constipation or melena, no indigestion GU:no hematuria, no dysuria MS:no joint pain, no claudication Neuro:no syncope, no lightheadedness Endo:+ diabetes, no thyroid disease      Physical Exam/Data:         Vitals:   04/28/17 1015 04/28/17 1030 04/28/17 1201 04/28/17 1304  BP: (!) 158/88 (!) 151/96 133/88 117/80  Pulse: (!) 56 (!) 55 (!) 54 (!) 55  Resp: 15 13 18 18   Temp:      TempSrc:      SpO2: 94% 98% 95% 99%   No intake or output data in the 24 hours ending 04/28/17 1332 There were no vitals filed for this visit. There is no height or weight on file to calculate BMI.  General:  Well nourished, well developed, in no acute distress HEENT: normal Lymph: no adenopathy Neck: no JVD Endocrine:  No thryomegaly Vascular: No carotid bruits; has boots on but no swelling Cardiac:  normal S1, S2; RRR; no murmur, gallup rub or click  Lungs:  clear to auscultation bilaterally, no wheezing, rhonchi or rales  Abd: soft, nontender, no hepatomegaly  Ext: no lower ext edema Musculoskeletal:  No deformities, BUE and BLE strength normal and equal Skin: warm and dry  Neuro:  Alert and oriented X 3, MAE follows commands, no focal abnormalities noted Psych:  Normal affect though anxious   Relevant CV Studies: 12/28/16 Conclusion     Prox RCA to Mid RCA lesion, 100 %stenosed.  SVG.  Ost LAD lesion, 100 %stenosed.  Ost Ramus lesion, 100 %stenosed.  LIMA and is normal in caliber.  SVG.  Prox Graft to Mid Graft lesion, 90 %stenosed.  Post intervention, there is a 0% residual stenosis.  A stent was successfully placed.  Mid Graft lesion, 75 %stenosed.  Post intervention, there is a 0% residual stenosis.  A stent was successfully placed.  The left ventricular systolic function is normal.  LV end diastolic pressure is normal.  The left ventricular ejection fraction is  55-65% by visual estimate.   Successful ramus intermedius branch SVG PCI and drug-eluting stenting for "in-stent restenosis using spider distal protection device along with RCA SVG PCI and drug-eluting stenting using distal protection as well with excellent angiographic results. Patient has normal LV function. Coronary Diagrams   Diagnostic Diagram       Post-Intervention Diagram         Laboratory Data:  Chemistry LastLabs     Recent Labs  Lab 04/28/17 0928  NA 138  K 4.3  CL 104  CO2 24  GLUCOSE 116*  BUN 9  CREATININE 0.89  CALCIUM 9.3  GFRNONAA >60  GFRAA >60  ANIONGAP 10      LastLabs  No results for input(s): PROT, ALBUMIN, AST, ALT, ALKPHOS, BILITOT in the last 168 hours.   Hematology LastLabs     Recent Labs  Lab 04/28/17 0928  WBC 9.8  RBC 4.90  HGB 15.0  HCT 45.5  MCV 92.9  MCH 30.6  MCHC 33.0  RDW 14.3  PLT 193     Cardiac Enzymes LastLabs     Recent Labs  Lab 04/28/17 1106  TROPONINI <0.03      LastLabs     Recent Labs  Lab 04/28/17 0945  TROPIPOC 0.00      BNP LastLabs  No results for input(s): BNP, PROBNP in the last 168 hours.    DDimer  Elie Confer  No results for input(s): DDIMER in the last 168 hours.    Radiology/Studies:  Dg Chest 2 View  Result Date: 04/28/2017 CLINICAL DATA:  Left-sided chest pain for 2 days. EXAM: CHEST  2 VIEW COMPARISON:  12/25/2016 FINDINGS: Normal heart size and stable aortic tortuosity. Status post CABG and coronary stenting. There is no edema, consolidation, effusion, or pneumothorax. Cholecystectomy clips. IMPRESSION: No evidence of active disease. Electronically Signed   By: Monte Fantasia M.D.   On: 04/28/2017 10:02    Assessment and Plan:   1. Chest pain, with neg MI and + CAD with recent stents to VG ramus intermedius and VG to RCA pt continues with Brilinta with mild SOB relief with caffeine.  EKG without acute changes.  Troponin neg. Dr. [name] to  see. 2. CAD with hx CABG and recent stents post NSTEMI. 3. HLD continue therapy 4. HTN treated was 153/96 on arrival.  Now 117/80.            Dr. [name] to see.  For questions or updates, please contact [place] Please consult [email or web address] for contact info under Cardiology/STEMI.    Signed, Cecilie Kicks, NP  04/28/2017 1:32 PM              History and all data above reviewed.  Patient examined.  I agree with the findings as above.  The patient presents with chest atypical symptoms of sweating and fatigue as described above.  He has been able to work vigorously over the last few days (as he always does) without getting any SOB or discomfort.  He has had none of the symptoms such as he had at the time of his presentation in August.  The patient denies any new symptoms such as chest discomfort, neck or arm discomfort. There has been no new shortness of breath, PND or orthopnea. There have been no  reported palpitations, presyncope or syncope.   The patient exam reveals COR:RRR  ,  Lungs: Clear  ,  Abd: Positive bowel sounds, no rebound no guarding, Ext No edema  .  All available labs, radiology testing, previous records reviewed. Agree with documented assessment and plan. CHEST PAIN:  He is really not describing this.  He has some vague symptoms and no objective evidence of ischemia.  I did look at the cath films.  At this point, if his next set of cardiac enzymes are OK, he can be discharged from the ED as the symptoms I think have a low likelihood of being related to obstructive CAD.    Jeneen Rinks Annarae Macnair  3:24 PM  04/28/2017

## 2017-05-03 ENCOUNTER — Telehealth: Payer: Self-pay | Admitting: Cardiology

## 2017-05-03 DIAGNOSIS — L578 Other skin changes due to chronic exposure to nonionizing radiation: Secondary | ICD-10-CM | POA: Diagnosis not present

## 2017-05-03 DIAGNOSIS — L400 Psoriasis vulgaris: Secondary | ICD-10-CM | POA: Diagnosis not present

## 2017-05-03 DIAGNOSIS — D692 Other nonthrombocytopenic purpura: Secondary | ICD-10-CM | POA: Diagnosis not present

## 2017-05-03 DIAGNOSIS — L812 Freckles: Secondary | ICD-10-CM | POA: Diagnosis not present

## 2017-05-03 DIAGNOSIS — L821 Other seborrheic keratosis: Secondary | ICD-10-CM | POA: Diagnosis not present

## 2017-05-03 DIAGNOSIS — D225 Melanocytic nevi of trunk: Secondary | ICD-10-CM | POA: Diagnosis not present

## 2017-05-03 DIAGNOSIS — D1800 Hemangioma unspecified site: Secondary | ICD-10-CM | POA: Diagnosis not present

## 2017-05-03 DIAGNOSIS — L719 Rosacea, unspecified: Secondary | ICD-10-CM | POA: Diagnosis not present

## 2017-05-03 DIAGNOSIS — Z1283 Encounter for screening for malignant neoplasm of skin: Secondary | ICD-10-CM | POA: Diagnosis not present

## 2017-05-03 NOTE — Telephone Encounter (Signed)
°  New Prob  Pt c/o of Chest Pain: STAT if CP now or developed within 24 hours  1. Are you having CP right now? No  2. Are you experiencing any other symptoms (ex. SOB, nausea, vomiting, sweating)? Lightheaded  3. How long have you been experiencing CP? 2 days   4. Is your CP continuous or coming and going? Coming and going  5. Have you taken Nitroglycerin? Yes ?

## 2017-05-03 NOTE — Telephone Encounter (Signed)
Spoke with pt, he was recently seen in the ER and for the last 3 days he has noticed increased SOB. He had previously stopped the isosorbide when he was seen in September but since Saturday he restarted it and his symptoms have improved. He does report some chest discomfort from time to time but has not exerted himself since Saturday. His bp is running 117/78 to 102/60. This in not the same type of discomfort he had prior to his stent but he does feel something is going on. Follow up appointment made with dr hochrein this Thursday and he voiced understanding to go to the ER if he gets concerned about his symptoms. Pt agreed with this plan.

## 2017-05-03 NOTE — Progress Notes (Signed)
HPI The patient presents for evaluation of CAD.  He was admitted from 8/10- 12/29/2016 for evaluation of chest pain with elevated troponin.  Cardiac catheterization on 12/28/2016 demonstrated severe native disease but the LIMA-LAD was patent with 75% stenosis noted along the SVG-RCA and 90% stenosis along the SVG-RI. Successful PCI with DES placement was performed to both lesions. He was started on DAPT with ASA and Brilinta along with being continued on BB and statin therapy.  I saw him in the ED a couple of days ago for evaluation of chest pain.  There was no objective evidence of ischemia and he was discharged from the ED.   Afterward he had some increased dyspnea.  He restarted isosorbide.    After getting out of the emergency room he did notice some discomfort after he carried 30 pounds 50 feet.  He felt fatigued and short of breath.  He did not really have chest discomfort but he did not feel well so he took a nitroglycerin and had improvement.  He then started himself back on isosorbide which he had previously.  He says he felt well since then.  He is able to carry 45 pounds 75 yards today without any symptoms.    Allergies  Allergen Reactions  . Oxycodone-Acetaminophen     REACTION: RASH - tolerated morphine  Note : Taking oxycodone/APAP 5-325 postoperatively 12/14 without issue  . Darvocet [Propoxyphene N-Acetaminophen]   . Propoxyphene Hives    Current Outpatient Medications  Medication Sig Dispense Refill  . amLODipine (NORVASC) 5 MG tablet Take 1 tablet (5 mg total) by mouth daily. 180 tablet 3  . Ascorbic Acid (VITAMIN C) 1000 MG tablet Take 1,000 mg by mouth daily. Reported on 09/26/2015    . aspirin 81 MG tablet Take 81 mg by mouth daily.    Marland Kitchen Bioflavonoid Products (GRAPE SEED PO) Take 1 capsule by mouth daily. Reported on 09/26/2015    . Cholecalciferol (VITAMIN D3) 5000 UNITS CAPS Take by mouth daily.    Marland Kitchen co-enzyme Q-10 30 MG capsule Take 30 mg by mouth daily.     Marland Kitchen  gabapentin (NEURONTIN) 100 MG capsule Take 100 mg by mouth 2 (two) times daily as needed.    Marland Kitchen GINKGO BILOBA PO Take 1 capsule by mouth daily.    . metoprolol tartrate (LOPRESSOR) 25 MG tablet Take 25 mg by mouth daily.    . milk thistle 175 MG tablet Take 175 mg by mouth daily.    . Multiple Vitamin (MULTIVITAMIN) tablet Take 1 tablet by mouth daily. Reported on 09/26/2015    . nitroGLYCERIN (NITROSTAT) 0.4 MG SL tablet Place 1 tablet (0.4 mg total) under the tongue every 5 (five) minutes x 3 doses as needed. 25 tablet 5  . Omega-3 Fatty Acids (FISH OIL) 1000 MG CAPS 1 tab po qd     . pantoprazole (PROTONIX) 40 MG tablet Take 40 mg daily by mouth.    . rosuvastatin (CRESTOR) 40 MG tablet Take 1 tablet (40 mg total) by mouth at bedtime. 90 tablet 1  . Saw Palmetto, Serenoa repens, (SAW PALMETTO PO) Take 1 capsule by mouth daily.    . SELENIUM PO Take 1 tablet by mouth daily. Reported on 09/26/2015    . ticagrelor (BRILINTA) 90 MG TABS tablet Take 90 mg 2 (two) times daily by mouth.    . vitamin E 100 UNIT capsule Take 100 Units by mouth daily.      . isosorbide mononitrate (IMDUR) 60 MG  24 hr tablet Take 1 tablet (60 mg total) by mouth daily. 90 tablet 3   No current facility-administered medications for this visit.     Past Medical History:  Diagnosis Date  . Acute cholecystitis 04/18/2013  . Adenomatous colon polyp 12/2004  . Anemia   . BPH associated with nocturia   . CAD (coronary artery disease)    a. s/p CABG x4 in 2000 with LIMA-LAD, reverse SVG-IM, reverse SVG-acute margin and reverse SVG-RCA b. s/p BMS to SVG-RI in 2012 c. 12/2016: PCI/DES to SVG-RCA and PCI/DES to SVG-RI (Dr. [name])   . Diabetes mellitus without complication ([place]) 99/83/3825   recent A1C 6.1 12/25/16 controlled with diet and exercise   . Diverticulitis 2008/2009  . GERD (gastroesophageal reflux disease)    controlled as of 04/01/17   . HTN (hypertension)   . Hyperglycemia   . Hyperlipidemia   . Impingement  syndrome of left shoulder 2016  . Low testosterone    Dr [name], [place] ,[place]  . Osteoarthritis of right knee    With trace effusion  . Peyronie disease   . RLS (restless legs syndrome) 08/15/2013  . Sinus bradycardia   . Thrombocytopenia ([place])   . Vitamin D deficiency     Past Surgical History:  Procedure Laterality Date  . CHOLECYSTECTOMY N/A 04/21/2013   Procedure: LAPAROSCOPIC CHOLECYSTECTOMY WITH INTRAOPERATIVE CHOLANGIOGRAM;  Surgeon: Haywood Lasso, MD;  Location: [facility];  Service: General;  Laterality: N/A;  . COLONOSCOPY W/ POLYPECTOMY  2004 & 2009    X 2; Dr Fuller Plan; due 2014  . CORONARY ANGIOPLASTY    . CORONARY ARTERY BYPASS GRAFT  04/1999   4 vessels  . CORONARY STENT INTERVENTION N/A 12/28/2016   Procedure: CORONARY STENT INTERVENTION;  Surgeon: Lorretta Harp, MD;  Location: [facility];  Service: Cardiovascular;  Laterality: N/A;  . CORONARY STENT PLACEMENT  2012   Plavix  . LAPAROSCOPIC CHOLECYSTECTOMY  04/21/2013   Dr Margot Chimes; acutecholecystitis with necrosis  . LEFT HEART CATH AND CORS/GRAFTS ANGIOGRAPHY N/A 12/28/2016   Procedure: LEFT HEART CATH AND CORS/GRAFTS ANGIOGRAPHY;  Surgeon: Lorretta Harp, MD;  Location: [facility];  Service: Cardiovascular;  Laterality: N/A;  . VASECTOMY      ROS:   As stated in the HPI and negative for all other systems.  PHYSICAL EXAM BP 126/90   Pulse 62   Resp (!) 98   Ht 5\' 8"  (1.727 m)   Wt 210 lb (95.3 kg)   BMI 31.93 kg/m   GENERAL:  Well appearing NECK:  No jugular venous distention, waveform within normal limits, carotid upstroke brisk and symmetric, no bruits, no thyromegaly LUNGS:  Clear to auscultation bilaterally CHEST:  Well healed sternotomy scar.  HEART:  PMI not displaced or sustained,S1 and S2 within normal limits, no S3, no S4, no clicks, no rubs, no murmurs ABD:  Flat, positive bowel sounds normal in frequency in pitch, no bruits, no rebound, no guarding, no midline pulsatile mass, no  hepatomegaly, no splenomegaly EXT:  2 plus pulses throughout, no edema, no cyanosis no clubbing   Lab Results  Component Value Date   CHOL 118 12/26/2016   TRIG 179 (H) 12/26/2016   HDL 35 (L) 12/26/2016   LDLCALC 47 12/26/2016   LDLDIRECT 91.0 03/30/2016   Lab Results  Component Value Date   HGBA1C 6.1 (H) 12/25/2016     ASSESSMENT AND PLAN  Coronary atherosclerosis of artery bypass graft -  Given the continued symptoms I  am going to increase his Imdur to 60 mg daily.  Right now he seems to be symptom-free but if he has further symptoms he and I have agreed that the next step would be cardiac catheterization.  If he has unstable or persistent symptoms he would present to the emergency room.  HYPERLIPIDEMIA -  LDL is at target.  No change in therapy.  HYPERTENSION -  The blood pressure is at target.  Med change as above.   DM  A1C is as above.  He will remain on the meds as listed.

## 2017-05-06 ENCOUNTER — Ambulatory Visit (INDEPENDENT_AMBULATORY_CARE_PROVIDER_SITE_OTHER): Payer: Medicare Other | Admitting: Cardiology

## 2017-05-06 ENCOUNTER — Encounter: Payer: Self-pay | Admitting: Cardiology

## 2017-05-06 VITALS — BP 126/90 | HR 62 | Resp 98 | Ht 68.0 in | Wt 210.0 lb

## 2017-05-06 DIAGNOSIS — I25118 Atherosclerotic heart disease of native coronary artery with other forms of angina pectoris: Secondary | ICD-10-CM | POA: Diagnosis not present

## 2017-05-06 DIAGNOSIS — I249 Acute ischemic heart disease, unspecified: Secondary | ICD-10-CM | POA: Diagnosis not present

## 2017-05-06 DIAGNOSIS — I209 Angina pectoris, unspecified: Secondary | ICD-10-CM

## 2017-05-06 DIAGNOSIS — I10 Essential (primary) hypertension: Secondary | ICD-10-CM | POA: Diagnosis not present

## 2017-05-06 DIAGNOSIS — E785 Hyperlipidemia, unspecified: Secondary | ICD-10-CM | POA: Diagnosis not present

## 2017-05-06 MED ORDER — ISOSORBIDE MONONITRATE ER 60 MG PO TB24: 60.0000 mg | ORAL_TABLET | Freq: Every day | ORAL | 3 refills | Status: DC

## 2017-05-06 NOTE — Patient Instructions (Signed)
Medication Instructions:  INCREASE- Isosorbide 60 mg daily  If you need a refill on your cardiac medications before your next appointment, please call your pharmacy.  Labwork: None Ordered   Testing/Procedures: None Ordered  Special Instructions:  Happy Holidays!!!  Follow-Up: Your physician wants you to follow-up in: 2 Months.    Thank you for choosing [facility] at [facility]!!

## 2017-05-10 DIAGNOSIS — R001 Bradycardia, unspecified: Secondary | ICD-10-CM | POA: Insufficient documentation

## 2017-05-10 DIAGNOSIS — E559 Vitamin D deficiency, unspecified: Secondary | ICD-10-CM | POA: Insufficient documentation

## 2017-05-10 DIAGNOSIS — M1711 Unilateral primary osteoarthritis, right knee: Secondary | ICD-10-CM | POA: Insufficient documentation

## 2017-05-10 DIAGNOSIS — E785 Hyperlipidemia, unspecified: Secondary | ICD-10-CM | POA: Insufficient documentation

## 2017-05-10 DIAGNOSIS — R351 Nocturia: Secondary | ICD-10-CM

## 2017-05-10 DIAGNOSIS — I2510 Atherosclerotic heart disease of native coronary artery without angina pectoris: Secondary | ICD-10-CM | POA: Insufficient documentation

## 2017-05-10 DIAGNOSIS — N486 Induration penis plastica: Secondary | ICD-10-CM | POA: Insufficient documentation

## 2017-05-10 DIAGNOSIS — N401 Enlarged prostate with lower urinary tract symptoms: Secondary | ICD-10-CM | POA: Insufficient documentation

## 2017-05-10 DIAGNOSIS — K219 Gastro-esophageal reflux disease without esophagitis: Secondary | ICD-10-CM | POA: Insufficient documentation

## 2017-05-10 DIAGNOSIS — R739 Hyperglycemia, unspecified: Secondary | ICD-10-CM | POA: Insufficient documentation

## 2017-05-10 DIAGNOSIS — E1169 Type 2 diabetes mellitus with other specified complication: Secondary | ICD-10-CM | POA: Insufficient documentation

## 2017-05-10 DIAGNOSIS — K5792 Diverticulitis of intestine, part unspecified, without perforation or abscess without bleeding: Secondary | ICD-10-CM | POA: Insufficient documentation

## 2017-05-10 DIAGNOSIS — I25118 Atherosclerotic heart disease of native coronary artery with other forms of angina pectoris: Secondary | ICD-10-CM | POA: Insufficient documentation

## 2017-05-10 DIAGNOSIS — D696 Thrombocytopenia, unspecified: Secondary | ICD-10-CM | POA: Insufficient documentation

## 2017-05-10 DIAGNOSIS — D649 Anemia, unspecified: Secondary | ICD-10-CM | POA: Insufficient documentation

## 2017-05-13 ENCOUNTER — Other Ambulatory Visit: Payer: Self-pay | Admitting: Internal Medicine

## 2017-05-13 DIAGNOSIS — K219 Gastro-esophageal reflux disease without esophagitis: Secondary | ICD-10-CM

## 2017-05-13 MED ORDER — PANTOPRAZOLE SODIUM 40 MG PO TBEC: 40.0000 mg | DELAYED_RELEASE_TABLET | Freq: Every day | ORAL | 1 refills | Status: DC

## 2017-05-14 ENCOUNTER — Ambulatory Visit: Payer: Medicare Other | Admitting: Cardiology

## 2017-05-15 ENCOUNTER — Other Ambulatory Visit: Payer: Self-pay | Admitting: Internal Medicine

## 2017-05-15 DIAGNOSIS — E785 Hyperlipidemia, unspecified: Secondary | ICD-10-CM

## 2017-05-24 ENCOUNTER — Telehealth: Payer: Self-pay | Admitting: Internal Medicine

## 2017-05-24 ENCOUNTER — Other Ambulatory Visit: Payer: Self-pay | Admitting: Internal Medicine

## 2017-05-24 NOTE — Telephone Encounter (Signed)
Copied from [place] [id number]. Topic: Quick Communication - Rx Refill/Question >> May 24, 2017  4:11 PM Oliver Pila B wrote: Pt called to check to see if a fax request from the pharmacy has been received for the ezetimibe (ZETIA) tablet 10 mg   [[id number]], contact to advise pt on what needs to be done, pt has been having difficulty getting the Rx filled

## 2017-05-24 NOTE — Telephone Encounter (Signed)
Please advise 

## 2017-05-25 ENCOUNTER — Other Ambulatory Visit: Payer: Self-pay | Admitting: Internal Medicine

## 2017-05-27 ENCOUNTER — Other Ambulatory Visit: Payer: Self-pay | Admitting: Internal Medicine

## 2017-05-27 DIAGNOSIS — E785 Hyperlipidemia, unspecified: Secondary | ICD-10-CM

## 2017-05-27 MED ORDER — EZETIMIBE 10 MG PO TABS: 10.0000 mg | ORAL_TABLET | Freq: Every day | ORAL | 3 refills | Status: DC

## 2017-05-27 NOTE — Telephone Encounter (Signed)
Pt needing a refill of Zetia.

## 2017-06-17 ENCOUNTER — Telehealth: Payer: Self-pay | Admitting: Cardiology

## 2017-06-17 ENCOUNTER — Ambulatory Visit (INDEPENDENT_AMBULATORY_CARE_PROVIDER_SITE_OTHER): Payer: Medicare Other | Admitting: Cardiology

## 2017-06-17 ENCOUNTER — Encounter: Payer: Self-pay | Admitting: Cardiology

## 2017-06-17 VITALS — BP 136/82 | HR 70 | Ht 68.5 in | Wt 214.0 lb

## 2017-06-17 DIAGNOSIS — I25119 Atherosclerotic heart disease of native coronary artery with unspecified angina pectoris: Secondary | ICD-10-CM | POA: Diagnosis not present

## 2017-06-17 DIAGNOSIS — I10 Essential (primary) hypertension: Secondary | ICD-10-CM | POA: Diagnosis not present

## 2017-06-17 DIAGNOSIS — I209 Angina pectoris, unspecified: Secondary | ICD-10-CM | POA: Diagnosis not present

## 2017-06-17 MED ORDER — CLOPIDOGREL BISULFATE 75 MG PO TABS: 75.0000 mg | ORAL_TABLET | Freq: Every day | ORAL | 3 refills | Status: DC

## 2017-06-17 MED ORDER — ISOSORBIDE MONONITRATE ER 120 MG PO TB24: 120.0000 mg | ORAL_TABLET | Freq: Every day | ORAL | 3 refills | Status: DC

## 2017-06-17 NOTE — Telephone Encounter (Signed)
Spoke with pt, he reports he is just not improving and the isosorbide is helping some but now he has developed some SOB. He is not sure if this is related to the brilinta or not but he feels he probably needs a cath at this point. Patient will come to the office today at 3 pm to see dr hochrein to discuss.

## 2017-06-17 NOTE — Progress Notes (Signed)
HPI The patient presents for evaluation of CAD.  He was admitted from 8/10- 12/29/2016 for evaluation of chest pain with elevated troponin.  Cardiac catheterization on 12/28/2016 demonstrated severe native disease but the LIMA-LAD was patent with 75% stenosis noted along the SVG-RCA and 90% stenosis along the SVG-RI. Successful PCI with DES placement was performed to both lesions. He was started on DAPT with ASA and Brilinta along with being continued on BB and statin therapy.  I saw him in the ED recently for evaluation of chest pain.  There was no objective evidence of ischemia and he was discharged from the ED.   Afterward he had some increased dyspnea.  He restarted isosorbide.   He called today and was having some SOB.    He says that he has been having some mild chest discomfort and jaw tightness that is less severe than he had previously, but he just noticed it today.  He called in to talk about it and was scheduled to be seen here.  He actually thinks is been better since he has been on isosorbide.  He does think he is more short of breath though he relates this to the [place].  He tries to drink coffee to help with this.  He does have some jaw discomfort occasionally but wonders if he clenches his teeth.  Has stress at work.  He denies any resting symptoms and usually when he is getting ready to be active or with activities.    Allergies  Allergen Reactions  . Oxycodone-Acetaminophen     REACTION: RASH - tolerated morphine  Note : Taking oxycodone/APAP 5-325 postoperatively 12/14 without issue  . Darvocet [Propoxyphene N-Acetaminophen]   . Propoxyphene Hives    Current Outpatient Medications  Medication Sig Dispense Refill  . amLODipine (NORVASC) 5 MG tablet Take 1 tablet (5 mg total) by mouth daily. 180 tablet 3  . Ascorbic Acid (VITAMIN C) 1000 MG tablet Take 1,000 mg by mouth daily. Reported on 09/26/2015    . aspirin 81 MG tablet Take 81 mg by mouth daily.    Marland Kitchen Bioflavonoid  Products (GRAPE SEED PO) Take 1 capsule by mouth daily. Reported on 09/26/2015    . Cholecalciferol (VITAMIN D3) 5000 UNITS CAPS Take by mouth daily.    Marland Kitchen co-enzyme Q-10 30 MG capsule Take 30 mg by mouth daily.     Marland Kitchen ezetimibe (ZETIA) 10 MG tablet Take 1 tablet (10 mg total) by mouth daily. 90 tablet 3  . gabapentin (NEURONTIN) 100 MG capsule Take 100 mg by mouth 2 (two) times daily as needed.    Marland Kitchen GINKGO BILOBA PO Take 1 capsule by mouth daily.    . isosorbide mononitrate (IMDUR) 120 MG 24 hr tablet Take 1 tablet (120 mg total) by mouth daily. 90 tablet 3  . metoprolol tartrate (LOPRESSOR) 25 MG tablet Take 25 mg by mouth daily.    . milk thistle 175 MG tablet Take 175 mg by mouth daily.    . Multiple Vitamin (MULTIVITAMIN) tablet Take 1 tablet by mouth daily. Reported on 09/26/2015    . nitroGLYCERIN (NITROSTAT) 0.4 MG SL tablet Place 1 tablet (0.4 mg total) under the tongue every 5 (five) minutes x 3 doses as needed. 25 tablet 5  . Omega-3 Fatty Acids (FISH OIL) 1000 MG CAPS 1 tab po qd     . pantoprazole (PROTONIX) 40 MG tablet Take 1 tablet (40 mg total) by mouth daily. 90 tablet 1  . rosuvastatin (CRESTOR) 40  MG tablet Take 1 tablet (40 mg total) by mouth at bedtime. 90 tablet 1  . Saw Palmetto, Serenoa repens, (SAW PALMETTO PO) Take 1 capsule by mouth daily.    . SELENIUM PO Take 1 tablet by mouth daily. Reported on 09/26/2015    . vitamin E 100 UNIT capsule Take 100 Units by mouth daily.      . clopidogrel (PLAVIX) 75 MG tablet Take 1 tablet (75 mg total) by mouth daily. 90 tablet 3   No current facility-administered medications for this visit.     Past Medical History:  Diagnosis Date  . Acute cholecystitis 04/18/2013  . Adenomatous colon polyp 12/2004  . Anemia   . BPH associated with nocturia   . CAD (coronary artery disease)    a. s/p CABG x4 in 2000 with LIMA-LAD, reverse SVG-IM, reverse SVG-acute margin and reverse SVG-RCA b. s/p BMS to SVG-RI in 2012 c. 12/2016: PCI/DES to  SVG-RCA and PCI/DES to SVG-RI (Dr. [name])   . Diabetes mellitus without complication ([place]) 82/42/3536   recent A1C 6.1 12/25/16 controlled with diet and exercise   . Diverticulitis 2008/2009  . GERD (gastroesophageal reflux disease)    controlled as of 04/01/17   . HTN (hypertension)   . Hyperglycemia   . Hyperlipidemia   . Impingement syndrome of left shoulder 2016  . Low testosterone    Dr [name], [place] ,[place]  . Osteoarthritis of right knee    With trace effusion  . Peyronie disease   . RLS (restless legs syndrome) 08/15/2013  . Sinus bradycardia   . Thrombocytopenia ([place])   . Vitamin D deficiency     Past Surgical History:  Procedure Laterality Date  . CHOLECYSTECTOMY N/A 04/21/2013   Procedure: LAPAROSCOPIC CHOLECYSTECTOMY WITH INTRAOPERATIVE CHOLANGIOGRAM;  Surgeon: Haywood Lasso, MD;  Location: [facility];  Service: General;  Laterality: N/A;  . COLONOSCOPY W/ POLYPECTOMY  2004 & 2009    X 2; Dr Fuller Plan; due 2014  . CORONARY ANGIOPLASTY    . CORONARY ARTERY BYPASS GRAFT  04/1999   4 vessels  . CORONARY STENT INTERVENTION N/A 12/28/2016   Procedure: CORONARY STENT INTERVENTION;  Surgeon: Lorretta Harp, MD;  Location: [facility];  Service: Cardiovascular;  Laterality: N/A;  . CORONARY STENT PLACEMENT  2012   Plavix  . LAPAROSCOPIC CHOLECYSTECTOMY  04/21/2013   Dr Margot Chimes; acutecholecystitis with necrosis  . LEFT HEART CATH AND CORS/GRAFTS ANGIOGRAPHY N/A 12/28/2016   Procedure: LEFT HEART CATH AND CORS/GRAFTS ANGIOGRAPHY;  Surgeon: Lorretta Harp, MD;  Location: [facility];  Service: Cardiovascular;  Laterality: N/A;  . VASECTOMY      ROS:   As stated in the HPI and negative for all other systems.  PHYSICAL EXAM BP 136/82   Pulse 70   Ht 5' 8.5" (1.74 m)   Wt 214 lb (97.1 kg)   BMI 32.07 kg/m   GENERAL:  Well appearing NECK:  No jugular venous distention, waveform within normal limits, carotid upstroke brisk and symmetric, no bruits, no  thyromegaly LUNGS:  Clear to auscultation bilaterally CHEST:  Well healed sternotomy scar. HEART:  PMI not displaced or sustained,S1 and S2 within normal limits, no S3, no S4, no clicks, no rubs, 2 out of 6 brief apical systolic murmur nonradiating, no diastolic murmurs ABD:  Flat, positive bowel sounds normal in frequency in pitch, no bruits, no rebound, no guarding, no midline pulsatile mass, no hepatomegaly, no splenomegaly EXT:  2 plus pulses throughout, no edema, no cyanosis  no clubbing  EKG: Sinus rhythm with premature atrial contractions, right bundle branch block, left anterior fascicular block, no acute ST-T wave changes.  Lab Results  Component Value Date   CHOL 118 12/26/2016   TRIG 179 (H) 12/26/2016   HDL 35 (L) 12/26/2016   LDLCALC 47 12/26/2016   LDLDIRECT 91.0 03/30/2016   Lab Results  Component Value Date   HGBA1C 6.1 (H) 12/25/2016     ASSESSMENT AND PLAN  Coronary atherosclerosis of artery bypass graft -  I am going to increase his Imdur 120 mg and have him start taking it in the morning as he is to been taking it at night.  We are going to stop his Brilinta and start Plavix.   HYPERLIPIDEMIA -  LDL is at target.  No change in therapy.   HYPERTENSION -  The blood pressure is at target. No change in medications is indicated. We will continue with therapeutic lifestyle changes (TLC).  DM  A1C is as above.  He will remain on meds as listed.

## 2017-06-17 NOTE — Telephone Encounter (Signed)
New message  Patient calling to discuss scheduling a "heart procedure".  States he has chest pains everyday. Please call    Pt c/o of Chest Pain: STAT if CP now or developed within 24 hours  1. Are you having CP right now? YES  2. Are you experiencing any other symptoms (ex. SOB, nausea, vomiting, sweating)? SOB  3. How long have you been experiencing CP? "almost everyday" in the morning  4. Is your CP continuous or coming and going? Coming and going  5. Have you taken Nitroglycerin? No ?

## 2017-06-17 NOTE — Patient Instructions (Signed)
Medication Instructions:  INCREASE- Isosorbide 120 mg daily STOP- Brilinta START- Plavix(clopidogrel)   If you need a refill on your cardiac medications before your next appointment, please call your pharmacy.  Labwork: None Ordered   Testing/Procedures: None Ordered   Follow-Up: Your physician wants you to follow-up in: February 21st with Rosaria Ferries, PA    Thank you for choosing [facility] at [facility]!!

## 2017-06-18 ENCOUNTER — Encounter: Payer: Self-pay | Admitting: Physician Assistant

## 2017-06-28 ENCOUNTER — Other Ambulatory Visit: Payer: Self-pay | Admitting: Internal Medicine

## 2017-06-29 NOTE — Telephone Encounter (Signed)
Can you call the patient is he taking this medication or not It looks it was discontinued 12/2016   [place]

## 2017-07-01 NOTE — Telephone Encounter (Signed)
Please advise 

## 2017-07-01 NOTE — Telephone Encounter (Signed)
Patient's wife calling to check on status of Ramipril. She says the script was not discontinued.

## 2017-07-02 ENCOUNTER — Other Ambulatory Visit: Payer: Self-pay | Admitting: Internal Medicine

## 2017-07-02 DIAGNOSIS — I10 Essential (primary) hypertension: Secondary | ICD-10-CM

## 2017-07-02 MED ORDER — RAMIPRIL 10 MG PO CAPS: 10.0000 mg | ORAL_CAPSULE | Freq: Every day | ORAL | 3 refills | Status: DC

## 2017-07-02 NOTE — Telephone Encounter (Signed)
Pts sister calling back about the Ramipril. She states he takes this for his blood pressure and is needing this rx. Uses [facility].

## 2017-07-08 ENCOUNTER — Encounter: Payer: Self-pay | Admitting: Physician Assistant

## 2017-07-08 ENCOUNTER — Ambulatory Visit (INDEPENDENT_AMBULATORY_CARE_PROVIDER_SITE_OTHER): Payer: Medicare Other | Admitting: Physician Assistant

## 2017-07-08 ENCOUNTER — Ambulatory Visit: Payer: Medicare Other | Admitting: Physician Assistant

## 2017-07-08 VITALS — BP 146/92 | HR 71 | Ht 68.5 in | Wt 216.2 lb

## 2017-07-08 DIAGNOSIS — I25118 Atherosclerotic heart disease of native coronary artery with other forms of angina pectoris: Secondary | ICD-10-CM

## 2017-07-08 DIAGNOSIS — R0989 Other specified symptoms and signs involving the circulatory and respiratory systems: Secondary | ICD-10-CM | POA: Diagnosis not present

## 2017-07-08 DIAGNOSIS — I10 Essential (primary) hypertension: Secondary | ICD-10-CM

## 2017-07-08 DIAGNOSIS — I25119 Atherosclerotic heart disease of native coronary artery with unspecified angina pectoris: Secondary | ICD-10-CM

## 2017-07-08 DIAGNOSIS — E785 Hyperlipidemia, unspecified: Secondary | ICD-10-CM | POA: Diagnosis not present

## 2017-07-08 MED ORDER — METOPROLOL SUCCINATE ER 25 MG PO TB24: 25.0000 mg | ORAL_TABLET | Freq: Every day | ORAL | 2 refills | Status: DC

## 2017-07-08 NOTE — Progress Notes (Addendum)
Cardiology Office Note   Date:  07/08/2017   ID:  Kevin Webb, DOB May 22, 1945, MRN [id number]  PCP:  McLean-Scocuzza, Nino Glow, MD  Cardiologist: Dr. [name], 06/17/2017 Rosaria Ferries, PA-C    History of Present Illness: Kevin Webb is a 72 y.o. male with a history of CABG 2000 (LIMA-LAD, SVG-RI, SVG-RCA), cath 12/2016 w/ DES SVG-RI &SVG-RCA, DM, HTN, HLD, Low testosterone, thrombocytopenia, low vit D, restless legs, RBBB & LAFB  01/31 office visit, Imdur increased to 120 mg daily, Brilinta stopped and Plavix started  Kevin Webb presents for cardiology follow up.  Since changing to Plavix, his breathing has gotten much better. He was recently able to move furniture [place] the beach without difficulty and can walk a mile without stopping.   He is compliant with his medications. His am BP is 128-130 on his home machine. HR will be in the 60s. His BP will go up at times in response to exertion, but will go back down. He is on the short-acting metoprolol, but only takes it once a day.   He does not get CP w/ exertion.   He has occasional headaches, not severe. No presyncope or syncope. He does have a vision problem, has seen the ophthalmologist about it.  He had visual changes in the color of the yellow lines in the street looked blue to him.  This has only happened one time, he was not lightheaded or dizzy at the time.  His sugar is under control. His last A1c was 6.1. He eats vegetables. He drinks 3 or so beers/day after work, but does not get many other carbs.  He had an aunt with significant diabetes complications including lower extremity amputation, so he understands the need to keep his sugars under control.  He checks his blood sugar daily.  He will get a small amount of lower extremity edema during the day, but denies waking with it.  He denies orthopnea or PND.   Past Medical History:  Diagnosis Date  . Acute cholecystitis 04/18/2013  . Adenomatous colon polyp 12/2004    . Anemia   . BPH associated with nocturia   . CAD (coronary artery disease)    a. s/p CABG x4 in 2000 with LIMA-LAD, reverse SVG-IM, reverse SVG-acute margin and reverse SVG-RCA b. s/p BMS to SVG-RI in 2012 c. 12/2016: PCI/DES to SVG-RCA and PCI/DES to SVG-RI (Dr. [name])   . Diabetes mellitus without complication ([place]) 32/95/1884   recent A1C 6.1 12/25/16 controlled with diet and exercise   . Diverticulitis 2008/2009  . GERD (gastroesophageal reflux disease)    controlled as of 04/01/17   . HTN (hypertension)   . Hyperglycemia   . Hyperlipidemia   . Impingement syndrome of left shoulder 2016  . Low testosterone    Dr [name], [place] ,[place]  . Osteoarthritis of right knee    With trace effusion  . Peyronie disease   . RLS (restless legs syndrome) 08/15/2013  . Sinus bradycardia   . Thrombocytopenia ([place])   . Vitamin D deficiency     Past Surgical History:  Procedure Laterality Date  . CHOLECYSTECTOMY N/A 04/21/2013   Procedure: LAPAROSCOPIC CHOLECYSTECTOMY WITH INTRAOPERATIVE CHOLANGIOGRAM;  Surgeon: Haywood Lasso, MD;  Location: [facility];  Service: General;  Laterality: N/A;  . COLONOSCOPY W/ POLYPECTOMY  2004 & 2009    X 2; Dr Fuller Plan; due 2014  . CORONARY ANGIOPLASTY    . CORONARY ARTERY BYPASS GRAFT  04/1999   4 vessels  .  CORONARY STENT INTERVENTION N/A 12/28/2016   Procedure: CORONARY STENT INTERVENTION;  Surgeon: Lorretta Harp, MD;  Location: [facility];  Service: Cardiovascular;  Laterality: N/A;  . CORONARY STENT PLACEMENT  2012   Plavix  . LAPAROSCOPIC CHOLECYSTECTOMY  04/21/2013   Dr Margot Chimes; acutecholecystitis with necrosis  . LEFT HEART CATH AND CORS/GRAFTS ANGIOGRAPHY N/A 12/28/2016   Procedure: LEFT HEART CATH AND CORS/GRAFTS ANGIOGRAPHY;  Surgeon: Lorretta Harp, MD;  Location: [facility];  Service: Cardiovascular;  Laterality: N/A;  . VASECTOMY      Current Outpatient Medications  Medication Sig Dispense Refill  . amLODipine (NORVASC)  5 MG tablet Take 1 tablet (5 mg total) by mouth daily. 180 tablet 3  . Ascorbic Acid (VITAMIN C) 1000 MG tablet Take 1,000 mg by mouth daily. Reported on 09/26/2015    . aspirin 81 MG tablet Take 81 mg by mouth daily.    Marland Kitchen Bioflavonoid Products (GRAPE SEED PO) Take 1 capsule by mouth daily. Reported on 09/26/2015    . Cholecalciferol (VITAMIN D3) 5000 UNITS CAPS Take by mouth daily.    . clopidogrel (PLAVIX) 75 MG tablet Take 1 tablet (75 mg total) by mouth daily. 90 tablet 3  . co-enzyme Q-10 30 MG capsule Take 30 mg by mouth daily.     Marland Kitchen ezetimibe (ZETIA) 10 MG tablet Take 1 tablet (10 mg total) by mouth daily. 90 tablet 3  . gabapentin (NEURONTIN) 100 MG capsule Take 100 mg by mouth 2 (two) times daily as needed.    Marland Kitchen GINKGO BILOBA PO Take 1 capsule by mouth daily.    . isosorbide mononitrate (IMDUR) 120 MG 24 hr tablet Take 1 tablet (120 mg total) by mouth daily. 90 tablet 3  . metoprolol tartrate (LOPRESSOR) 25 MG tablet Take 25 mg by mouth daily.    . milk thistle 175 MG tablet Take 175 mg by mouth daily.    . Multiple Vitamin (MULTIVITAMIN) tablet Take 1 tablet by mouth daily. Reported on 09/26/2015    . nitroGLYCERIN (NITROSTAT) 0.4 MG SL tablet Place 1 tablet (0.4 mg total) under the tongue every 5 (five) minutes x 3 doses as needed. 25 tablet 5  . Omega-3 Fatty Acids (FISH OIL) 1000 MG CAPS 1 tab po qd     . pantoprazole (PROTONIX) 40 MG tablet Take 1 tablet (40 mg total) by mouth daily. 90 tablet 1  . ramipril (ALTACE) 10 MG capsule Take 1 capsule (10 mg total) by mouth daily. 90 capsule 3  . rosuvastatin (CRESTOR) 40 MG tablet Take 1 tablet (40 mg total) by mouth at bedtime. 90 tablet 1  . Saw Palmetto, Serenoa repens, (SAW PALMETTO PO) Take 1 capsule by mouth daily.    . SELENIUM PO Take 1 tablet by mouth daily. Reported on 09/26/2015    . vitamin E 100 UNIT capsule Take 100 Units by mouth daily.       No current facility-administered medications for this visit.     Allergies:    Oxycodone-acetaminophen; Darvocet [propoxyphene n-acetaminophen]; and Propoxyphene    Social History:  The patient  reports that  has never smoked. he has never used smokeless tobacco. He reports that he drinks about 6.0 - 7.2 oz of alcohol per week. He reports that he does not use drugs.   Family History:  The patient's family history includes Aneurysm in his mother; Aortic aneurysm (age of onset: 16) in his father; CVA in his mother; Cholecystitis in his sister; Coronary artery  disease in his mother; Diabetes in his brother and unknown relative; Heart attack (age of onset: 21) in his father.    ROS:  Please see the history of present illness. All other systems are reviewed and negative.    PHYSICAL EXAM: VS:  BP (!) 146/92   Pulse 71   Ht 5' 8.5" (1.74 m)   Wt 216 lb 3.2 oz (98.1 kg)   BMI 32.39 kg/m  , BMI Body mass index is 32.39 kg/m. GEN: Well nourished, well developed, male in no acute distress  HEENT: normal for age  Neck: no JVD, L carotid bruit, no masses Cardiac: RRR; short systolic murmur, no rubs, or gallops Respiratory:  clear to auscultation bilaterally, normal work of breathing GI: soft, nontender, nondistended, + BS MS: no deformity or atrophy; no edema; distal pulses are 2+ in all 4 extremities   Skin: warm and dry, no rash Neuro:  Strength and sensation are intact Psych: euthymic mood, full affect   EKG:  EKG is not ordered today.   Recent Labs: 12/25/2016: TSH 3.622 12/26/2016: ALT 22 04/28/2017: BUN 9; Creatinine, Ser 0.89; Hemoglobin 15.0; Platelets 193; Potassium 4.3; Sodium 138    Lipid Panel    Component Value Date/Time   CHOL 118 12/26/2016 0520   TRIG 179 (H) 12/26/2016 0520   HDL 35 (L) 12/26/2016 0520   CHOLHDL 3.4 12/26/2016 0520   VLDL 36 12/26/2016 0520   LDLCALC 47 12/26/2016 0520   LDLDIRECT 91.0 03/30/2016 1144     Wt Readings from Last 3 Encounters:  07/08/17 216 lb 3.2 oz (98.1 kg)  06/17/17 214 lb (97.1 kg)  05/06/17 210 lb  (95.3 kg)     Other studies Reviewed: Additional studies/ records that were reviewed today include: office notes, hospital records and testing.  ASSESSMENT AND PLAN:  1.  CAD: Pt condition much improved after changing from Brilinta to Plavix.  His current combination of medications is working well for him.  No ischemic symptoms, no shortness of breath.  He does not feel at all limited in his activities.  Change metoprolol to 24-hour form.  2.  Hypertension: His blood pressure is up in the office today but he assures me that he checks it every morning and prior to taking his medications its generally well controlled.  He notes that his blood pressure does increase in response to exertion, he feels that is what happened today.  Will change his beta-blocker to 24-hour formulation and keep the dose the same.  Follow vital signs.  3.  Hyperlipidemia: He is on Crestor, his LDL was 47 at his hospitalization in August 2018.  Follow-up with Dr. [name], his PCP is new so need to make sure that LFTs and lipid profile gets checked yearly.  4.  Diabetes: He feels he does pretty well with diet control.  His major source of carbohydrates is probably beer.  He is encouraged to limit this.  Follow-up with PCP  5.  Left carotid bruit: A faint carotid bruit was auscultated on physical exam.  I explained that he could get vascular disease not only in his heart but in his neck.  He has a cousin that just had a endarterectomy.  He is agreeable to obtaining carotid Dopplers, follow-up on the results.   Current medicines are reviewed at length with the patient today.  The patient does not have concerns regarding medicines.  The following changes have been made:  no change  Labs/ tests ordered today include:  No orders  of the defined types were placed in this encounter.    Disposition:   FU with Dr. [name]  Signed, Rosaria Ferries, PA-C  07/08/2017 1:02 PM    [facility] Phone:  [phone]; Fax: [phone]  This note was written with the assistance of speech recognition software. Please excuse any transcriptional errors.

## 2017-07-08 NOTE — Patient Instructions (Addendum)
Medication Instructions:  STOP Metoprolol tartrate START Metoprolol Succinate 25 mg Take 1 tablet once a day  Labwork: None   Testing/Procedures: Your physician has requested that you have a carotid duplex. This test is an ultrasound of the carotid arteries in your neck. It looks at blood flow through these arteries that supply the brain with blood. Allow one hour for this exam. There are no restrictions or special instructions.  Follow-Up: Your physician wants you to follow-up in: 6 months with Dr [name]. You will receive a reminder letter in the mail two months in advance. If you don't receive a letter, please call our office to schedule the follow-up appointment.  Any Other Special Instructions Will Be Listed Below (If Applicable). If you need a refill on your cardiac medications before your next appointment, please call your pharmacy.

## 2017-07-14 ENCOUNTER — Other Ambulatory Visit: Payer: Self-pay | Admitting: Physician Assistant

## 2017-07-14 ENCOUNTER — Ambulatory Visit (HOSPITAL_COMMUNITY)
Admission: RE | Admit: 2017-07-14 | Discharge: 2017-07-14 | Disposition: A | Discharge status: Home / Self Care | Payer: Medicare Other | Source: Ambulatory Visit | Attending: Cardiology | Admitting: Cardiology

## 2017-07-14 DIAGNOSIS — R0989 Other specified symptoms and signs involving the circulatory and respiratory systems: Secondary | ICD-10-CM

## 2017-07-14 DIAGNOSIS — I6523 Occlusion and stenosis of bilateral carotid arteries: Secondary | ICD-10-CM | POA: Diagnosis not present

## 2017-09-29 ENCOUNTER — Ambulatory Visit: Payer: Medicare Other | Admitting: *Deleted

## 2017-10-16 ENCOUNTER — Other Ambulatory Visit: Payer: Self-pay | Admitting: Internal Medicine

## 2017-11-02 ENCOUNTER — Ambulatory Visit: Payer: Self-pay

## 2017-11-02 NOTE — Telephone Encounter (Signed)
Pt. called to request an appt.  Reported he had onset of fever on Saturday, 6/15; temp. 103 degrees.  Stated he feels weak.  Reported this morning "it feels like it's hard to put one foot in front of the other."   Stated he has had an intermittent headache.  Stated he just doesn't feel good.  Reported fever is down now;  temp. 99.9 last night, and 97.8 this AM.  Denied any pain, nausea, vomiting, chest discomfort or shortness of breath.  Reported his wife placed her hand on his chest last night, and said he was having  heart palpitations.  He denied feeling any palpitations at that time.  Pt. checked pulse now, during triage call, with an App to check the heart rate; reported pulse 82 bpm, and rhythm was noted to be regular.  Denied any unilateral weakness.  Appt. scheduled with NP 11/03/17.  Care Advice given per protocol; knows to call back if sx's worsen.       Reason for Disposition . [1] MODERATE weakness (i.e., interferes with work, school, normal activities) AND [2] persists > 3 days  Answer Assessment - Initial Assessment Questions 1. DESCRIPTION: "Describe how you are feeling."     Feels like can't put one foot in front of the other  2. SEVERITY: "How bad is it?"  "Can you stand and walk?"   - MILD - Feels weak or tired, but does not interfere with work, school or normal activities   - [place] to stand and walk; weakness interferes with work, school, or normal activities   - SEVERE - Unable to stand or walk     Moderate 3. ONSET:  "When did the weakness begin?"    Saturday, and had fever at that time 4. CAUSE: "What do you think is causing the weakness?"     unknown 5. MEDICINES: "Have you recently started a new medicine or had a change in the amount of a medicine?"     Denied any change in symptoms 6. OTHER SYMPTOMS: "Do you have any other symptoms?" (e.g., chest pain, fever, cough, SOB, vomiting, diarrhea, bleeding)     Denied the above; temp. 97.8 today  Answer Assessment -  Initial Assessment Questions 1. TEMPERATURE: "What is the most recent temperature?"  "How was it measured?"      97.8 at 6:30 AM  2. ONSET: "When did the fever start?"      Saturday, on 6/15 had fever of 103 degrees; on Monday eve.  temp. 99.9 on 6/17 3. SYMPTOMS: "Do you have any other symptoms besides the fever?"  (e.g., colds, headache, sore throat, earache, cough, rash, diarrhea, vomiting, abdominal pain)     C/o weakness; headache;  " I don't feel good" , heart palpitations last night; BP 119/70, Pulse 75.  4. CAUSE: If there are no symptoms, ask: "What do you think is causing the fever?"     Unknown; hx of diverticulitis; denied any abdominal pain 5. CONTACTS: "Does anyone else in the family have an infection?"     unknown 6. TREATMENT: "What have you done so far to treat this fever?" (e.g., medications)     Took Tylenol for fever 7. IMMUNOCOMPROMISE: "Do you have of the following: diabetes, HIV positive, splenectomy, cancer chemotherapy, chronic steroid treatment, transplant patient, etc."     None of the above  Protocols used: WEAKNESS (GENERALIZED) AND FATIGUE-A-AH, FEVER-A-AH

## 2017-11-03 ENCOUNTER — Telehealth: Payer: Self-pay

## 2017-11-03 ENCOUNTER — Ambulatory Visit (INDEPENDENT_AMBULATORY_CARE_PROVIDER_SITE_OTHER): Payer: Medicare Other | Admitting: Family

## 2017-11-03 ENCOUNTER — Other Ambulatory Visit: Payer: Self-pay | Admitting: Family

## 2017-11-03 ENCOUNTER — Encounter: Payer: Self-pay | Admitting: Family

## 2017-11-03 ENCOUNTER — Telehealth: Payer: Self-pay | Admitting: Internal Medicine

## 2017-11-03 VITALS — BP 132/100 | HR 63 | Temp 98.1°F | Resp 16 | Wt 212.5 lb

## 2017-11-03 DIAGNOSIS — I6523 Occlusion and stenosis of bilateral carotid arteries: Secondary | ICD-10-CM

## 2017-11-03 DIAGNOSIS — R509 Fever, unspecified: Secondary | ICD-10-CM

## 2017-11-03 DIAGNOSIS — E785 Hyperlipidemia, unspecified: Secondary | ICD-10-CM

## 2017-11-03 DIAGNOSIS — K219 Gastro-esophageal reflux disease without esophagitis: Secondary | ICD-10-CM

## 2017-11-03 LAB — CBC WITH DIFFERENTIAL/PLATELET
BASOS PCT: 0.1 % (ref 0.0–3.0)
Basophils Absolute: 0 10*3/uL (ref 0.0–0.1)
EOS ABS: 0 10*3/uL (ref 0.0–0.7)
Eosinophils Relative: 0.1 % (ref 0.0–5.0)
HCT: 42.5 % (ref 39.0–52.0)
HEMOGLOBIN: 14.4 g/dL (ref 13.0–17.0)
LYMPHS ABS: 0.7 10*3/uL (ref 0.7–4.0)
Lymphocytes Relative: 30.4 % (ref 12.0–46.0)
MCHC: 34 g/dL (ref 30.0–36.0)
MCV: 89.2 fl (ref 78.0–100.0)
MONO ABS: 0.3 10*3/uL (ref 0.1–1.0)
Monocytes Relative: 13.7 % — ABNORMAL HIGH (ref 3.0–12.0)
NEUTROS PCT: 55.7 % (ref 43.0–77.0)
Neutro Abs: 1.3 10*3/uL — ABNORMAL LOW (ref 1.4–7.7)
Platelets: 122 10*3/uL — ABNORMAL LOW (ref 150.0–400.0)
RBC: 4.77 Mil/uL (ref 4.22–5.81)
RDW: 14.5 % (ref 11.5–15.5)

## 2017-11-03 LAB — COMPREHENSIVE METABOLIC PANEL
ALBUMIN: 4.1 g/dL (ref 3.5–5.2)
ALK PHOS: 38 U/L — AB (ref 39–117)
ALT: 35 U/L (ref 0–53)
AST: 57 U/L — AB (ref 0–37)
BUN: 10 mg/dL (ref 6–23)
CO2: 27 mEq/L (ref 19–32)
CREATININE: 0.97 mg/dL (ref 0.40–1.50)
Calcium: 9 mg/dL (ref 8.4–10.5)
Chloride: 102 mEq/L (ref 96–112)
GFR: 81.35 mL/min (ref >60.00)
GLUCOSE: 108 mg/dL — AB (ref 70–99)
Potassium: 4.3 mEq/L (ref 3.5–5.1)
SODIUM: 136 meq/L (ref 135–145)
TOTAL PROTEIN: 6.8 g/dL (ref 6.0–8.3)
Total Bilirubin: 0.8 mg/dL (ref 0.2–1.2)

## 2017-11-03 MED ORDER — DOXYCYCLINE HYCLATE 100 MG PO TABS: 100.0000 mg | ORAL_TABLET | Freq: Two times a day (BID) | ORAL | 0 refills | Status: DC

## 2017-11-03 MED ORDER — PANTOPRAZOLE SODIUM 40 MG PO TBEC
40.0000 mg | DELAYED_RELEASE_TABLET | Freq: Every day | ORAL | 1 refills | Status: DC
Start: 2017-11-03 — End: 2017-12-23

## 2017-11-03 MED ORDER — ROSUVASTATIN CALCIUM 40 MG PO TABS: 40.0000 mg | ORAL_TABLET | Freq: Every day | ORAL | 1 refills | Status: DC

## 2017-11-03 NOTE — Progress Notes (Signed)
Subjective:    Patient ID: Kevin Webb, male    DOB: 15-Feb-1946, 72 y.o.   MRN: [id number]  CC: Kevin Webb is a 72 y.o. male who presents today for follow up.   HPI: CC: 5 days ago had a fever, 101.5 max, resolved.  No sinus congestion, fever, diarrhea, abdominal pain, dysuria, HA.   No one else sick at home. No travel.   Took flagyl and cipro which he had had from prior episode of diverticulitis ( last flare 2017). . Endorses fatigue x 5 days, unchanged.   Muscle cramps in calves started today.   Notes tick bite one month ago, had been imbedded. Thinks there < 24 hours and 'thinks on his belly'.  HLD - has been on crestor for 10 years. Stopped lipitor due to body aches.   HTN- compliant with medications. At home 119/70. Denies exertional chest pain or pressure, numbness or tingling radiating to left arm or jaw, palpitations, dizziness, frequent headaches, changes in vision, or shortness of breath.    H/o cabg and stent 2012, 2018.   Dr [name]- cardiology- 05/2017 HISTORY:  Past Medical History:  Diagnosis Date  . Acute cholecystitis 04/18/2013  . Adenomatous colon polyp 12/2004  . Anemia   . BPH associated with nocturia   . CAD (coronary artery disease)    a. s/p CABG x4 in 2000 with LIMA-LAD, reverse SVG-IM, reverse SVG-acute margin and reverse SVG-RCA b. s/p BMS to SVG-RI in 2012 c. 12/2016: PCI/DES to SVG-RCA and PCI/DES to SVG-RI (Dr. [name])   . Diabetes mellitus without complication ([place]) 40/98/1191   recent A1C 6.1 12/25/16 controlled with diet and exercise   . Diverticulitis 2008/2009  . GERD (gastroesophageal reflux disease)    controlled as of 04/01/17   . HTN (hypertension)   . Hyperglycemia   . Hyperlipidemia   . Impingement syndrome of left shoulder 2016  . Low testosterone    Dr [name], [place] ,[place]  . Osteoarthritis of right knee    With trace effusion  . Peyronie disease   . RLS (restless legs syndrome) 08/15/2013  . Sinus bradycardia   .  Thrombocytopenia ([place])   . Vitamin D deficiency    Past Surgical History:  Procedure Laterality Date  . CHOLECYSTECTOMY N/A 04/21/2013   Procedure: LAPAROSCOPIC CHOLECYSTECTOMY WITH INTRAOPERATIVE CHOLANGIOGRAM;  Surgeon: Haywood Lasso, MD;  Location: [facility];  Service: General;  Laterality: N/A;  . COLONOSCOPY W/ POLYPECTOMY  2004 & 2009    X 2; Dr Fuller Plan; due 2014  . CORONARY ANGIOPLASTY    . CORONARY ARTERY BYPASS GRAFT  04/1999   4 vessels  . CORONARY STENT INTERVENTION N/A 12/28/2016   Procedure: CORONARY STENT INTERVENTION;  Surgeon: Lorretta Harp, MD;  Location: [facility];  Service: Cardiovascular;  Laterality: N/A;  . CORONARY STENT PLACEMENT  2012   Plavix  . LAPAROSCOPIC CHOLECYSTECTOMY  04/21/2013   Dr Margot Chimes; acutecholecystitis with necrosis  . LEFT HEART CATH AND CORS/GRAFTS ANGIOGRAPHY N/A 12/28/2016   Procedure: LEFT HEART CATH AND CORS/GRAFTS ANGIOGRAPHY;  Surgeon: Lorretta Harp, MD;  Location: [facility];  Service: Cardiovascular;  Laterality: N/A;  . VASECTOMY     Family History  Problem Relation Age of Onset  . Aortic aneurysm Father 41       ? abdominal  . Heart attack Father 28  . Diabetes Unknown        PGaunt  . Coronary artery disease Mother  CABG in 60s  . CVA Mother        cns bleed from warfarin  . Aneurysm Mother   . Diabetes Brother   . Cholecystitis Sister   . Cancer Neg Hx   . Colon cancer Neg Hx   . Prostate cancer Neg Hx     Allergies: Oxycodone-acetaminophen; Darvocet [propoxyphene n-acetaminophen]; and Propoxyphene Current Outpatient Medications on File Prior to Visit  Medication Sig Dispense Refill  . amLODipine (NORVASC) 5 MG tablet TAKE ONE (1) TABLET EACH DAY 90 tablet 1  . Ascorbic Acid (VITAMIN C) 1000 MG tablet Take 1,000 mg by mouth daily. Reported on 09/26/2015    . aspirin 81 MG tablet Take 81 mg by mouth daily.    Marland Kitchen Bioflavonoid Products (GRAPE SEED PO) Take 1 capsule by mouth daily. Reported on  09/26/2015    . Cholecalciferol (VITAMIN D3) 5000 UNITS CAPS Take by mouth daily.    . clopidogrel (PLAVIX) 75 MG tablet Take 1 tablet (75 mg total) by mouth daily. 90 tablet 3  . co-enzyme Q-10 30 MG capsule Take 30 mg by mouth daily.     Marland Kitchen ezetimibe (ZETIA) 10 MG tablet Take 1 tablet (10 mg total) by mouth daily. 90 tablet 3  . gabapentin (NEURONTIN) 100 MG capsule Take 100 mg by mouth 2 (two) times daily as needed.    Marland Kitchen GINKGO BILOBA PO Take 1 capsule by mouth daily.    . metoprolol succinate (TOPROL XL) 25 MG 24 hr tablet Take 1 tablet (25 mg total) by mouth daily. 90 tablet 2  . milk thistle 175 MG tablet Take 175 mg by mouth daily.    . Multiple Vitamin (MULTIVITAMIN) tablet Take 1 tablet by mouth daily. Reported on 09/26/2015    . nitroGLYCERIN (NITROSTAT) 0.4 MG SL tablet Place 1 tablet (0.4 mg total) under the tongue every 5 (five) minutes x 3 doses as needed. 25 tablet 5  . Omega-3 Fatty Acids (FISH OIL) 1000 MG CAPS 1 tab po qd     . pantoprazole (PROTONIX) 40 MG tablet Take 1 tablet (40 mg total) by mouth daily. 90 tablet 1  . ramipril (ALTACE) 10 MG capsule Take 1 capsule (10 mg total) by mouth daily. 90 capsule 3  . rosuvastatin (CRESTOR) 40 MG tablet Take 1 tablet (40 mg total) by mouth at bedtime. 90 tablet 1  . Saw Palmetto, Serenoa repens, (SAW PALMETTO PO) Take 1 capsule by mouth daily.    . SELENIUM PO Take 1 tablet by mouth daily. Reported on 09/26/2015    . vitamin E 100 UNIT capsule Take 100 Units by mouth daily.      . isosorbide mononitrate (IMDUR) 120 MG 24 hr tablet Take 1 tablet (120 mg total) by mouth daily. 90 tablet 3   No current facility-administered medications on file prior to visit.     Social History   Tobacco Use  . Smoking status: Never Smoker  . Smokeless tobacco: Never Used  Substance Use Topics  . Alcohol use: Yes    Alcohol/week: 6.0 - 7.2 oz    Types: 10 - 12 Cans of beer per week    Comment: socially   . Drug use: No    Review of Systems    Constitutional: Positive for fatigue and fever.  HENT: Negative for congestion.   Respiratory: Negative for cough.   Cardiovascular: Negative for chest pain and palpitations.  Gastrointestinal: Negative for nausea and vomiting.      Objective:  BP (!) 132/100   Pulse 63   Temp 98.1 F (36.7 C) (Oral)   Resp 16   Wt 212 lb 8 oz (96.4 kg)   SpO2 96%   BMI 31.84 kg/m  BP Readings from Last 3 Encounters:  11/03/17 (!) 132/100  07/08/17 (!) 146/92  06/17/17 136/82   Wt Readings from Last 3 Encounters:  11/03/17 212 lb 8 oz (96.4 kg)  07/08/17 216 lb 3.2 oz (98.1 kg)  06/17/17 214 lb (97.1 kg)    Physical Exam  Constitutional: Vital signs are normal. He appears well-developed and well-nourished.  HENT:  Head: Normocephalic and atraumatic.  Right Ear: Hearing, tympanic membrane, external ear and ear canal normal. No drainage, swelling or tenderness. Tympanic membrane is not injected, not erythematous and not bulging. No middle ear effusion. No decreased hearing is noted.  Left Ear: Hearing, tympanic membrane, external ear and ear canal normal. No drainage, swelling or tenderness. Tympanic membrane is not injected, not erythematous and not bulging.  No middle ear effusion. No decreased hearing is noted.  Nose: Nose normal. Right sinus exhibits no maxillary sinus tenderness and no frontal sinus tenderness. Left sinus exhibits no maxillary sinus tenderness and no frontal sinus tenderness.  Mouth/Throat: Uvula is midline, oropharynx is clear and moist and mucous membranes are normal. No oropharyngeal exudate, posterior oropharyngeal edema, posterior oropharyngeal erythema or tonsillar abscesses.  Eyes: Conjunctivae are normal.  Cardiovascular: Regular rhythm and normal heart sounds.  Pulmonary/Chest: Effort normal and breath sounds normal. No respiratory distress. He has no wheezes. He has no rhonchi. He has no rales.  Abdominal: Soft. Normal appearance and bowel sounds are normal.  He exhibits no distension, no fluid wave, no ascites and no mass. There is no tenderness. There is no rigidity, no rebound, no guarding, no CVA tenderness, no tenderness at McBurney's point and negative Murphy's sign.  Lymphadenopathy:       Head (right side): No submental, no submandibular, no tonsillar, no preauricular, no posterior auricular and no occipital adenopathy present.       Head (left side): No submental, no submandibular, no tonsillar, no preauricular, no posterior auricular and no occipital adenopathy present.    He has no cervical adenopathy.  Neurological: He is alert.  Skin: Skin is warm and dry.  Psychiatric: He has a normal mood and affect. His speech is normal and behavior is normal.  Vitals reviewed.      Assessment & Plan:   1. Fever, unspecified fever cause Resolved at this time.  Patient is well appearing.  Patient is appropriately fatigued today after fever, advised I suspect this would be self limiting ( ? Viral).  Etiology of fever is nonspecific at this time which I discussed at great length with patient.  Reassured by benign abdominal exam.  Specifically no left lower quadrant tenderness.    Although patient felt better after antibiotics ( cipro and flagyl) , this may have been coicidence.  We discussed today that in the context of not knowing where his symptoms were coming from, whether viral,  bacterial, I did not feel was appropriate to start more antibiotics at this time.  He was very understanding of this.  He will remain hypervigilant for any new or worsening symptoms and let us know.  Pending labs.  Also Lyme titers to tick bite due 1 month ago.  Discussed with patient low suspicion for Lyme disease tick as was not attached for more than 24 hours. Of note, diastolic blood pressure is elevated today.  Reviewed prior blood pressures, typically patient at home in office was well controlled therefore I did not adjust today.  He will monitor blood pressure at home.   Advised that if persists, he will contact PCP or cardiologist. - Lyme Disease, IgM, Early Test w/ Rflx - Lyme Ab/Western Blot Reflex - Comprehensive metabolic panel - CBC with Differential/Platelet   I am having Kevin Webb "Gene" maintain his Fish Oil, multivitamin, vitamin C, vitamin E, co-enzyme Q-10, Vitamin D3, SELENIUM PO, (Saw Palmetto, Serenoa repens, (SAW PALMETTO PO)), Bioflavonoid Products (GRAPE SEED PO), aspirin, nitroGLYCERIN, GINKGO BILOBA PO, gabapentin, rosuvastatin, milk thistle, pantoprazole, ezetimibe, isosorbide mononitrate, clopidogrel, ramipril, metoprolol succinate, and amLODipine.   No orders of the defined types were placed in this encounter.   Return precautions given.   Risks, benefits, and alternatives of the medications and treatment plan prescribed today were discussed, and patient expressed understanding.   Education regarding symptom management and diagnosis given to patient on AVS.  Continue to follow with McLean-Scocuzza, Nino Glow, MD for routine health maintenance.   Kevin Webb and I agreed with plan.   Mable Paris, FNP

## 2017-11-03 NOTE — Telephone Encounter (Signed)
Can you please add on [place] Spotted Fever , to labs drawn today.   Thanks

## 2017-11-03 NOTE — Addendum Note (Signed)
Addended by: Leeanne Rio on: 11/03/2017 04:16 PM   Modules accepted: Orders

## 2017-11-03 NOTE — Telephone Encounter (Signed)
Medication has been refilled.

## 2017-11-03 NOTE — Telephone Encounter (Signed)
Copied from [place] [id number]. Topic: Quick Communication - Rx Refill/Question >> Nov 03, 2017  4:24 PM [name] wrote: Medication: rosuvastatin (CRESTOR) 40 MG tablet pantoprazole (PROTONIX) 40 MG tablet.   Has the patient contacted their pharmacy? yes  Preferred Pharmacy (with phone number or street name): [facility], [place] [phone])  Agent: Please be advised that RX refills may take up to 3 business days. We ask that you follow-up with your pharmacy.

## 2017-11-03 NOTE — Patient Instructions (Signed)
Reassured as you look good today.  As discussed at great length, etiology for fever over the weekend is nonspecific at this time.  Most important you feel better in time and that NO new symptoms present.    As we discussed, no reason to give antibiotics.  I want you to remain vigilant.  We will do lab work today.    Please let us know again, if new or worsening symptoms.  Pleasure meeting you.

## 2017-11-03 NOTE — Progress Notes (Unsigned)
Added on [facility] labs Can we add?

## 2017-11-05 LAB — LYME AB/WESTERN BLOT REFLEX: Lyme IgG/IgM Ab: <0.91 {ISR} (ref 0.00–0.90)

## 2017-11-06 ENCOUNTER — Telehealth (HOSPITAL_COMMUNITY): Payer: Self-pay | Admitting: Family

## 2017-11-06 LAB — ROCKY MTN SPOTTED FVR AB, IGG-BLOOD: RMSF IgG: NEGATIVE

## 2017-11-06 NOTE — Telephone Encounter (Signed)
Lab ordered.

## 2017-11-11 ENCOUNTER — Ambulatory Visit (INDEPENDENT_AMBULATORY_CARE_PROVIDER_SITE_OTHER): Payer: Medicare Other

## 2017-11-11 VITALS — BP 138/64 | HR 67 | Temp 98.3°F | Resp 14 | Ht 68.0 in | Wt 208.8 lb

## 2017-11-11 DIAGNOSIS — Z0000 Encounter for general adult medical examination without abnormal findings: Secondary | ICD-10-CM

## 2017-11-11 NOTE — Progress Notes (Signed)
Subjective:   Kevin Webb is a 72 y.o. male who presents for Medicare Annual/Subsequent preventive examination.  Review of Systems:  No ROS.  Medicare Wellness Visit. Additional risk factors are reflected in the social history. Cardiac Risk Factors include: advanced age (>72men, >73 women);male gender     Objective:    Vitals: BP 138/64 (BP Location: Left Arm, Patient Position: Sitting, Cuff Size: Normal)   Pulse 67   Temp 98.3 F (36.8 C) (Oral)   Resp 14   Ht 5\' 8"  (1.727 m)   Wt 208 lb 12.8 oz (94.7 kg)   SpO2 95%   BMI 31.75 kg/m   Body mass index is 31.75 kg/m.  Advanced Directives 11/11/2017 04/28/2017 12/26/2016 12/25/2016 09/28/2016 04/18/2015 06/13/2014  Does Patient Have a Medical Advance Directive? Yes Yes Yes No Yes Yes No  Type of Paramedic of [place];Living will Living will Living will;Healthcare Power of [place];Living will [place];Living will -  Does patient want to make changes to medical advance directive? No - Patient declined - No - Patient declined - No - Patient declined No - Patient declined -  Copy of [place] in Chart? Yes - No - copy requested - No - copy requested No - copy requested -  Would patient like information on creating a medical advance directive? - Yes (ED - Information included in AVS) No - Patient declined - - - No - patient declined information    Tobacco Social History   Tobacco Use  Smoking Status Never Smoker  Smokeless Tobacco Never Used     Counseling given: Not Answered   Clinical Intake:  Pre-visit preparation completed: Yes  Pain : No/denies pain     Nutritional Status: BMI > 30  Obese Diabetes: No  How often do you need to have someone help you when you read instructions, pamphlets, or other written materials from your doctor or pharmacy?: 1 - Never  Interpreter Needed?: No     Past Medical History:  Diagnosis  Date  . Acute cholecystitis 04/18/2013  . Adenomatous colon polyp 12/2004  . Anemia   . BPH associated with nocturia   . CAD (coronary artery disease)    a. s/p CABG x4 in 2000 with LIMA-LAD, reverse SVG-IM, reverse SVG-acute margin and reverse SVG-RCA b. s/p BMS to SVG-RI in 2012 c. 12/2016: PCI/DES to SVG-RCA and PCI/DES to SVG-RI (Dr. [name])   . Diabetes mellitus without complication ([place]) 73/41/9379   recent A1C 6.1 12/25/16 controlled with diet and exercise   . Diverticulitis 2008/2009  . GERD (gastroesophageal reflux disease)    controlled as of 04/01/17   . HTN (hypertension)   . Hyperglycemia   . Hyperlipidemia   . Impingement syndrome of left shoulder 2016  . Low testosterone    Dr [name], [place]  . Osteoarthritis of right knee    With trace effusion  . Peyronie disease   . RLS (restless legs syndrome) 08/15/2013  . Sinus bradycardia   . Thrombocytopenia ([place])   . Vitamin D deficiency    Past Surgical History:  Procedure Laterality Date  . CHOLECYSTECTOMY N/A 04/21/2013   Procedure: LAPAROSCOPIC CHOLECYSTECTOMY WITH INTRAOPERATIVE CHOLANGIOGRAM;  Surgeon: Haywood Lasso, MD;  Location: [facility];  Service: General;  Laterality: N/A;  . COLONOSCOPY W/ POLYPECTOMY  2004 & 2009    X 2; Dr Fuller Plan; due 2014  . CORONARY ANGIOPLASTY    . CORONARY ARTERY BYPASS  GRAFT  04/1999   4 vessels  . CORONARY STENT INTERVENTION N/A 12/28/2016   Procedure: CORONARY STENT INTERVENTION;  Surgeon: Lorretta Harp, MD;  Location: [facility];  Service: Cardiovascular;  Laterality: N/A;  . CORONARY STENT PLACEMENT  2012   Plavix  . LAPAROSCOPIC CHOLECYSTECTOMY  04/21/2013   Dr Margot Chimes; acutecholecystitis with necrosis  . LEFT HEART CATH AND CORS/GRAFTS ANGIOGRAPHY N/A 12/28/2016   Procedure: LEFT HEART CATH AND CORS/GRAFTS ANGIOGRAPHY;  Surgeon: Lorretta Harp, MD;  Location: [facility];  Service: Cardiovascular;  Laterality: N/A;  . VASECTOMY     Family History    Problem Relation Age of Onset  . Aortic aneurysm Father 61       ? abdominal  . Heart attack Father 19  . Diabetes Unknown        PGaunt  . Coronary artery disease Mother        CABG in 35s  . CVA Mother        cns bleed from warfarin  . Aneurysm Mother   . Diabetes Brother   . Cholecystitis Sister   . Cancer Neg Hx   . Colon cancer Neg Hx   . Prostate cancer Neg Hx    Social History   Socioeconomic History  . Marital status: Married    Spouse name: Not on file  . Number of children: 1  . Years of education: Not on file  . Highest education level: Not on file  Occupational History  . Occupation:      Comment: plumber  Social Needs  . Financial resource strain: Not hard at all  . Food insecurity:    Worry: Never true    Inability: Never true  . Transportation needs:    Medical: No    Non-medical: No  Tobacco Use  . Smoking status: Never Smoker  . Smokeless tobacco: Never Used  Substance and Sexual Activity  . Alcohol use: Yes    Alcohol/week: 6.0 - 7.2 oz    Types: 10 - 12 Cans of beer per week    Comment: socially   . Drug use: No  . Sexual activity: Not on file  Lifestyle  . Physical activity:    Days per week: Not on file    Minutes per session: Not on file  . Stress: Not at all  Relationships  . Social connections:    Talks on phone: Not on file    Gets together: Not on file    Attends religious service: Not on file    Active member of club or organization: Not on file    Attends meetings of clubs or organizations: Not on file    Relationship status: Not on file  Other Topics Concern  . Not on file  Social History Narrative   Remarried 2014   1 son age 44 as of 03/2017     Outpatient Encounter Medications as of 11/11/2017  Medication Sig  . amLODipine (NORVASC) 5 MG tablet TAKE ONE (1) TABLET EACH DAY  . Ascorbic Acid (VITAMIN C) 1000 MG tablet Take 1,000 mg by mouth daily. Reported on 09/26/2015  . aspirin 81 MG tablet Take 81 mg by mouth  daily.  Marland Kitchen Bioflavonoid Products (GRAPE SEED PO) Take 1 capsule by mouth daily. Reported on 09/26/2015  . Cholecalciferol (VITAMIN D3) 5000 UNITS CAPS Take by mouth daily.  . clopidogrel (PLAVIX) 75 MG tablet Take 1 tablet (75 mg total) by mouth daily.  Marland Kitchen co-enzyme Q-10 30  MG capsule Take 30 mg by mouth daily.   Marland Kitchen doxycycline (VIBRA-TABS) 100 MG tablet Take 1 tablet (100 mg total) by mouth 2 (two) times daily.  Marland Kitchen ezetimibe (ZETIA) 10 MG tablet Take 1 tablet (10 mg total) by mouth daily.  Marland Kitchen gabapentin (NEURONTIN) 100 MG capsule Take 100 mg by mouth 2 (two) times daily as needed.  Marland Kitchen GINKGO BILOBA PO Take 1 capsule by mouth daily.  . metoprolol succinate (TOPROL XL) 25 MG 24 hr tablet Take 1 tablet (25 mg total) by mouth daily.  . milk thistle 175 MG tablet Take 175 mg by mouth daily.  . Multiple Vitamin (MULTIVITAMIN) tablet Take 1 tablet by mouth daily. Reported on 09/26/2015  . nitroGLYCERIN (NITROSTAT) 0.4 MG SL tablet Place 1 tablet (0.4 mg total) under the tongue every 5 (five) minutes x 3 doses as needed.  . Omega-3 Fatty Acids (FISH OIL) 1000 MG CAPS 1 tab po qd   . pantoprazole (PROTONIX) 40 MG tablet Take 1 tablet (40 mg total) by mouth daily.  . ramipril (ALTACE) 10 MG capsule Take 1 capsule (10 mg total) by mouth daily.  . rosuvastatin (CRESTOR) 40 MG tablet Take 1 tablet (40 mg total) by mouth at bedtime.  . Saw Palmetto, Serenoa repens, (SAW PALMETTO PO) Take 1 capsule by mouth daily.  . SELENIUM PO Take 1 tablet by mouth daily. Reported on 09/26/2015  . vitamin E 100 UNIT capsule Take 100 Units by mouth daily.    . isosorbide mononitrate (IMDUR) 120 MG 24 hr tablet Take 1 tablet (120 mg total) by mouth daily.   No facility-administered encounter medications on file as of 11/11/2017.     Activities of Daily Living In your present state of health, do you have any difficulty performing the following activities: 11/11/2017 12/26/2016  Hearing? N N  Vision? N N  Difficulty concentrating  or making decisions? N N  Walking or climbing stairs? N N  Dressing or bathing? N N  Doing errands, shopping? N N  Preparing Food and eating ? N -  Using the Toilet? N -  In the past six months, have you accidently leaked urine? N -  Do you have problems with loss of bowel control? N -  Managing your Medications? N -  Managing your Finances? N -  Housekeeping or managing your Housekeeping? N -  Some recent data might be hidden    Patient Care Team: McLean-Scocuzza, Nino Glow, MD as PCP - General (Internal Medicine) Minus Breeding, MD as PCP - Cardiology (Cardiology) Minus Breeding, MD as Consulting Physician (Cardiology) Royston Cowper, MD as Consulting Physician (Urology) Ladene Artist, MD as Consulting Physician (Gastroenterology)   Assessment:   This is a routine wellness examination for Roslyn.  The goal of the wellness visit is to assist the patient how to close the gaps in care and create a preventative care plan for the patient.   The roster of all physicians providing medical care to patient is listed in the Snapshot section of the chart.  Taking calcium VIT D as appropriate/Osteoporosis risk reviewed.    Safety issues reviewed; Smoke and carbon monoxide detectors in the home. Firearms locked in a safe within the home. Wears seatbelts when driving or riding with others. No violence in the home.  They do not have excessive sun exposure.  Discussed the need for sun protection: hats, long sleeves and the use of sunscreen if there is significant sun exposure.  Patient is alert, normal appearance, oriented  to person/place/and time. Correctly identified the president of the [place] and recalls of 2/3 words.Performs simple calculations and can read correct time from watch face. Displays appropriate judgement.  No new identified risk were noted.  No failures at ADL's or IADL's.    BMI- discussed the importance of a healthy diet, water intake and the benefits of aerobic  exercise. Educational material provided.   24 hour diet recall: Plant based diet  Dental- every 6 months.  Eye- Visual acuity not assessed per patient preference since they have regular follow up with the ophthalmologist.  Wears corrective lenses.  Sleep patterns- Sleeps 4-6 hours at night.  Wakes feeling rested.   Patient Concerns: None at this time. Follow up with PCP as needed.  Exercise Activities and Dietary recommendations Current Exercise Habits: Home exercise routine, Type of exercise: walking, Time (Minutes): 30, Frequency (Times/Week): 4, Weekly Exercise (Minutes/Week): 120, Intensity: Moderate  Goals    . Maintain Healthy Lifestyle     Exercise Stay hydrated  Healthy diet     . Weight (lb) < 185 lb (83.9 kg)     Portion controlled meals/snacks Low carb/cholesterol diet       Fall Risk Fall Risk  11/11/2017 09/28/2016 04/24/2016 03/30/2016 01/25/2015  Falls in the past year? No Yes No No No  Comment - - Emmi Telephone Survey: data to providers prior to load - -  Number falls in past yr: - 1 - - -  Risk for fall due to : - History of fall(s) - - -  Follow up - Education provided;Falls prevention discussed - - -   Depression Screen PHQ 2/9 Scores 11/11/2017 09/28/2016 03/30/2016 01/25/2015  PHQ - 2 Score 0 0 0 0    Cognitive Function MMSE - Mini Mental State Exam 11/11/2017  Orientation to time 5  Orientation to Place 5  Registration 3  Attention/ Calculation 5  Recall 2  Language- name 2 objects 2  Language- repeat 1  Language- follow 3 step command 3  Language- read & follow direction 1  Write a sentence 1  Copy design 1  Total score 29        Immunization History  Administered Date(s) Administered  . Influenza, High Dose Seasonal PF 01/25/2015, 03/30/2016, 04/01/2017  . Influenza,inj,Quad PF,6+ Mos 04/25/2014  . Pneumococcal Conjugate-13 09/27/2015  . Pneumococcal Polysaccharide-23 04/25/2014  . Td 08/07/2008  . Zoster 03/21/2014   Screening  Tests Health Maintenance  Topic Date Due  . FOOT EXAM  12/11/1955  . HEMOGLOBIN A1C  06/27/2017  . INFLUENZA VACCINE  12/16/2017  . OPHTHALMOLOGY EXAM  07/28/2018  . TETANUS/TDAP  08/08/2018  . COLONOSCOPY  05/01/2020  . Hepatitis C Screening  Completed  . PNA vac Low Risk Adult  Completed       Plan:    End of life planning; Advance aging; Advanced directives discussed. Copy of current HCPOA/Living Will on file.    I have personally reviewed and noted the following in the patient's chart:   . Medical and social history . Use of alcohol, tobacco or illicit drugs  . Current medications and supplements . Functional ability and status . Nutritional status . Physical activity . Advanced directives . List of other physicians . Hospitalizations, surgeries, and ER visits in previous 12 months . Vitals . Screenings to include cognitive, depression, and falls . Referrals and appointments  In addition, I have reviewed and discussed with patient certain preventive protocols, quality metrics, and best practice recommendations. A written personalized care plan  for preventive services as well as general preventive health recommendations were provided to patient.     Varney Biles, LPN  [id number]

## 2017-11-11 NOTE — Patient Instructions (Addendum)
  [name] , Thank you for taking time to come for your Medicare Wellness Visit. I appreciate your ongoing commitment to your health goals. Please review the following plan we discussed and let me know if I can assist you in the future.   Routine maintenance appointments with your primary care physician.  These are the goals we discussed: Goals    . Maintain Healthy Lifestyle     Exercise Stay hydrated  Healthy diet     . Weight (lb) < 185 lb (83.9 kg)     Portion controlled meals/snacks Low carb/cholesterol diet       This is a list of the screening recommended for you and due dates:  Health Maintenance  Topic Date Due  . Complete foot exam   12/11/1955  . Hemoglobin A1C  06/27/2017  . Flu Shot  12/16/2017  . Eye exam for diabetics  07/28/2018  . Tetanus Vaccine  08/08/2018  . Colon Cancer Screening  05/01/2020  .  Hepatitis C: One time screening is recommended by Center for Disease Control  (CDC) for  adults born from [birth year] through 1963.   Completed  . Pneumonia vaccines  Completed

## 2017-11-16 ENCOUNTER — Telehealth: Payer: Self-pay | Admitting: Cardiology

## 2017-11-16 NOTE — Telephone Encounter (Signed)
Called the patient and LVM to call back to schedule Dr. Rosezella [place] appt in August.

## 2017-12-16 ENCOUNTER — Other Ambulatory Visit: Payer: Self-pay

## 2017-12-23 ENCOUNTER — Ambulatory Visit (INDEPENDENT_AMBULATORY_CARE_PROVIDER_SITE_OTHER): Payer: Medicare Other | Admitting: Internal Medicine

## 2017-12-23 ENCOUNTER — Encounter: Payer: Self-pay | Admitting: Internal Medicine

## 2017-12-23 VITALS — BP 136/80 | HR 92 | Temp 99.1°F | Ht 68.0 in | Wt 210.8 lb

## 2017-12-23 DIAGNOSIS — Z0184 Encounter for antibody response examination: Secondary | ICD-10-CM

## 2017-12-23 DIAGNOSIS — Z1159 Encounter for screening for other viral diseases: Secondary | ICD-10-CM

## 2017-12-23 DIAGNOSIS — R7303 Prediabetes: Secondary | ICD-10-CM | POA: Diagnosis not present

## 2017-12-23 DIAGNOSIS — D72819 Decreased white blood cell count, unspecified: Secondary | ICD-10-CM

## 2017-12-23 DIAGNOSIS — R079 Chest pain, unspecified: Secondary | ICD-10-CM | POA: Diagnosis not present

## 2017-12-23 DIAGNOSIS — K449 Diaphragmatic hernia without obstruction or gangrene: Secondary | ICD-10-CM

## 2017-12-23 DIAGNOSIS — K219 Gastro-esophageal reflux disease without esophagitis: Secondary | ICD-10-CM

## 2017-12-23 DIAGNOSIS — R6884 Jaw pain: Secondary | ICD-10-CM

## 2017-12-23 DIAGNOSIS — I2510 Atherosclerotic heart disease of native coronary artery without angina pectoris: Secondary | ICD-10-CM | POA: Diagnosis not present

## 2017-12-23 DIAGNOSIS — R748 Abnormal levels of other serum enzymes: Secondary | ICD-10-CM

## 2017-12-23 DIAGNOSIS — N419 Inflammatory disease of prostate, unspecified: Secondary | ICD-10-CM | POA: Diagnosis not present

## 2017-12-23 HISTORY — DX: Inflammatory disease of prostate, unspecified: N41.9

## 2017-12-23 MED ORDER — CIPROFLOXACIN HCL 500 MG PO TABS: 500.0000 mg | ORAL_TABLET | Freq: Two times a day (BID) | ORAL | 0 refills | Status: DC

## 2017-12-23 MED ORDER — PANTOPRAZOLE SODIUM 40 MG PO TBEC: 40.0000 mg | DELAYED_RELEASE_TABLET | Freq: Two times a day (BID) | ORAL | 0 refills | Status: DC

## 2017-12-23 NOTE — Progress Notes (Signed)
Pre visit review using our clinic review tool, if applicable. No additional management support is needed unless otherwise documented below in the visit note. 

## 2017-12-23 NOTE — Progress Notes (Addendum)
Chief Complaint  Patient presents with  . Follow-up   F/u  1. Reviewed labs wbc low 11/03/17 and lfts elevated slightly. Lyme and rmsf titers normal pt thinks he was sick with diverticulitis at this visit with fever and abdominal pain he had leftover flagyl and cipro x 1 pill which he took and was given doxycycline for suspected tick borne illness visit 10/31/17   2. Today c/o uncontrolled GERD sx and jaw pain not typical of CP as he has h/o CAD s/p PCI. He tried NTG w/o help so he does not think related to prior MI and these sx's not typical presentation for MI sx's prev.. Denies dysphagia but reports h/o hiatal hernia. Tried protonix 40 mg x 1 w/o relief and friends Omeprazole.  Eating BBQ, pintos one day exacerbated sx's and heartburn sx's. Worse with eating and lying dow.  3. CAD s/p PCI f/u with Dr. Felipa Eth 8/21 and if getting alarm sx's will go to ED.  4. C/o prostate pain with bike riding stationary and motorcycling and riding bike he will call and sch appt with Dr. Rogers Blocker PSA nl 03/2017 he is requesting Cipro  5. He reports h/a from grinding his teeth advised he disc with dentist     Review of Systems  Constitutional: Negative for fever.  HENT:       +grinding teeth  Respiratory: Negative for shortness of breath.   Cardiovascular: Positive for chest pain.  Gastrointestinal: Positive for heartburn. Negative for abdominal pain, blood in stool, nausea and vomiting.  Genitourinary:       +prostate pain   Skin: Negative for rash.  Neurological: Positive for headaches. Negative for dizziness.  Psychiatric/Behavioral: Negative for depression.   Past Medical History:  Diagnosis Date  . Acute cholecystitis 04/18/2013  . Adenomatous colon polyp 12/2004  . Anemia   . BPH associated with nocturia   . CAD (coronary artery disease)    a. s/p CABG x4 in 2000 with LIMA-LAD, reverse SVG-IM, reverse SVG-acute margin and reverse SVG-RCA b. s/p BMS to SVG-RI in 2012 c. 12/2016: PCI/DES to SVG-RCA  and PCI/DES to SVG-RI (Dr. [name])   . Diabetes mellitus without complication ([place]) 22/97/9892   recent A1C 6.1 12/25/16 controlled with diet and exercise   . Diverticulitis 2008/2009  . GERD (gastroesophageal reflux disease)    controlled as of 04/01/17   . HTN (hypertension)   . Hyperglycemia   . Hyperlipidemia   . Impingement syndrome of left shoulder 2016  . Low testosterone    Dr [name], [place] ,[place]  . Osteoarthritis of right knee    With trace effusion  . Peyronie disease   . RLS (restless legs syndrome) 08/15/2013  . Sinus bradycardia   . Thrombocytopenia ([place])   . Vitamin D deficiency    Past Surgical History:  Procedure Laterality Date  . CHOLECYSTECTOMY N/A 04/21/2013   Procedure: LAPAROSCOPIC CHOLECYSTECTOMY WITH INTRAOPERATIVE CHOLANGIOGRAM;  Surgeon: Haywood Lasso, MD;  Location: [facility];  Service: General;  Laterality: N/A;  . COLONOSCOPY W/ POLYPECTOMY  2004 & 2009    X 2; Dr Fuller Plan; due 2014  . CORONARY ANGIOPLASTY    . CORONARY ARTERY BYPASS GRAFT  04/1999   4 vessels  . CORONARY STENT INTERVENTION N/A 12/28/2016   Procedure: CORONARY STENT INTERVENTION;  Surgeon: Lorretta Harp, MD;  Location: [facility];  Service: Cardiovascular;  Laterality: N/A;  . CORONARY STENT PLACEMENT  2012   Plavix  . LAPAROSCOPIC CHOLECYSTECTOMY  04/21/2013   Dr Margot Chimes;  acutecholecystitis with necrosis  . LEFT HEART CATH AND CORS/GRAFTS ANGIOGRAPHY N/A 12/28/2016   Procedure: LEFT HEART CATH AND CORS/GRAFTS ANGIOGRAPHY;  Surgeon: Lorretta Harp, MD;  Location: [facility];  Service: Cardiovascular;  Laterality: N/A;  . VASECTOMY     Family History  Problem Relation Age of Onset  . Aortic aneurysm Father 36       ? abdominal  . Heart attack Father 49  . Diabetes Unknown        PGaunt  . Coronary artery disease Mother        CABG in 57s  . CVA Mother        cns bleed from warfarin  . Aneurysm Mother   . Diabetes Brother   . Cholecystitis Sister   .  Cancer Neg Hx   . Colon cancer Neg Hx   . Prostate cancer Neg Hx    Social History   Socioeconomic History  . Marital status: Married    Spouse name: Not on file  . Number of children: 1  . Years of education: Not on file  . Highest education level: Not on file  Occupational History  . Occupation:      Comment: plumber  Social Needs  . Financial resource strain: Not hard at all  . Food insecurity:    Worry: Never true    Inability: Never true  . Transportation needs:    Medical: No    Non-medical: No  Tobacco Use  . Smoking status: Never Smoker  . Smokeless tobacco: Never Used  Substance and Sexual Activity  . Alcohol use: Yes    Alcohol/week: 10.0 - 12.0 standard drinks    Types: 10 - 12 Cans of beer per week    Comment: socially   . Drug use: No  . Sexual activity: Not on file  Lifestyle  . Physical activity:    Days per week: Not on file    Minutes per session: Not on file  . Stress: Not at all  Relationships  . Social connections:    Talks on phone: Not on file    Gets together: Not on file    Attends religious service: Not on file    Active member of club or organization: Not on file    Attends meetings of clubs or organizations: Not on file    Relationship status: Not on file  . Intimate partner violence:    Fear of current or ex partner: No    Emotionally abused: No    Physically abused: No    Forced sexual activity: No  Other Topics Concern  . Not on file  Social History Narrative   Remarried 2014   1 son age 17 as of 03/2017    Current Meds  Medication Sig  . amLODipine (NORVASC) 5 MG tablet TAKE ONE (1) TABLET EACH DAY  . Ascorbic Acid (VITAMIN C) 1000 MG tablet Take 1,000 mg by mouth daily. Reported on 09/26/2015  . aspirin 81 MG tablet Take 81 mg by mouth daily.  Marland Kitchen Bioflavonoid Products (GRAPE SEED PO) Take 1 capsule by mouth daily. Reported on 09/26/2015  . Cholecalciferol (VITAMIN D3) 5000 UNITS CAPS Take by mouth daily.  . clopidogrel  (PLAVIX) 75 MG tablet Take 1 tablet (75 mg total) by mouth daily.  Marland Kitchen co-enzyme Q-10 30 MG capsule Take 30 mg by mouth daily.   Marland Kitchen doxycycline (VIBRA-TABS) 100 MG tablet Take 1 tablet (100 mg total) by mouth 2 (two) times daily.  Marland Kitchen  ezetimibe (ZETIA) 10 MG tablet Take 1 tablet (10 mg total) by mouth daily.  Marland Kitchen gabapentin (NEURONTIN) 100 MG capsule Take 100 mg by mouth 2 (two) times daily as needed.  Marland Kitchen GINKGO BILOBA PO Take 1 capsule by mouth daily.  . metoprolol succinate (TOPROL XL) 25 MG 24 hr tablet Take 1 tablet (25 mg total) by mouth daily.  . milk thistle 175 MG tablet Take 175 mg by mouth daily.  . Multiple Vitamin (MULTIVITAMIN) tablet Take 1 tablet by mouth daily. Reported on 09/26/2015  . nitroGLYCERIN (NITROSTAT) 0.4 MG SL tablet Place 1 tablet (0.4 mg total) under the tongue every 5 (five) minutes x 3 doses as needed.  . Omega-3 Fatty Acids (FISH OIL) 1000 MG CAPS 1 tab po qd   . pantoprazole (PROTONIX) 40 MG tablet Take 1 tablet (40 mg total) by mouth daily.  . ramipril (ALTACE) 10 MG capsule Take 1 capsule (10 mg total) by mouth daily.  . rosuvastatin (CRESTOR) 40 MG tablet Take 1 tablet (40 mg total) by mouth at bedtime.  . Saw Palmetto, Serenoa repens, (SAW PALMETTO PO) Take 1 capsule by mouth daily.  . SELENIUM PO Take 1 tablet by mouth daily. Reported on 09/26/2015  . vitamin E 100 UNIT capsule Take 100 Units by mouth daily.     Allergies  Allergen Reactions  . Oxycodone-Acetaminophen     REACTION: RASH - tolerated morphine  Note : Taking oxycodone/APAP 5-325 postoperatively 12/14 without issue  . Darvocet [Propoxyphene N-Acetaminophen]   . Propoxyphene Hives   Recent Results (from the past 2160 hour(s))  Lyme Ab/Western Blot Reflex     Status: None   Collection Time: 11/03/17 11:44 AM  Result Value Ref Range   Lyme IgG/IgM Ab <0.91 0.00 - 0.90 ISR    Comment:                                 Negative         <0.91                                 Equivocal  0.91 - 1.09                                  Positive         >1.09    LYME DISEASE AB, QUANT, IGM <0.80 0.00 - 0.79 index    Comment:                                 Negative         <0.80                                 Equivocal  0.80 - 1.19                                 Positive         >1.19  IgM levels may peak at 3-6 weeks post infection, then  gradually decline.   Comprehensive metabolic panel     Status: Abnormal   Collection Time: 11/03/17 11:44 AM  Result Value Ref Range   Sodium 136 135 - 145 mEq/L   Potassium 4.3 3.5 - 5.1 mEq/L   Chloride 102 96 - 112 mEq/L   CO2 27 19 - 32 mEq/L   Glucose, Bld 108 (H) 70 - 99 mg/dL   BUN 10 6 - 23 mg/dL   Creatinine, Ser 0.97 0.40 - 1.50 mg/dL   Total Bilirubin 0.8 0.2 - 1.2 mg/dL   Alkaline Phosphatase 38 (L) 39 - 117 U/L   AST 57 (H) 0 - 37 U/L   ALT 35 0 - 53 U/L   Total Protein 6.8 6.0 - 8.3 g/dL   Albumin 4.1 3.5 - 5.2 g/dL   Calcium 9.0 8.4 - 10.5 mg/dL   GFR 81.35 >60.00 mL/min  CBC with Differential/Platelet     Status: Abnormal   Collection Time: 11/03/17 11:44 AM  Result Value Ref Range   WBC 2.3 Repeated and verified X2. (L) 4.0 - 10.5 K/uL   RBC 4.77 4.22 - 5.81 Mil/uL   Hemoglobin 14.4 13.0 - 17.0 g/dL   HCT 42.5 39.0 - 52.0 %   MCV 89.2 78.0 - 100.0 fl   MCHC 34.0 30.0 - 36.0 g/dL   RDW 14.5 11.5 - 15.5 %   Platelets 122.0 (L) 150.0 - 400.0 K/uL   Neutrophils Relative % 55.7 43.0 - 77.0 %   Lymphocytes Relative 30.4 12.0 - 46.0 %   Monocytes Relative 13.7 (H) 3.0 - 12.0 %   Eosinophils Relative 0.1 0.0 - 5.0 %   Basophils Relative 0.1 0.0 - 3.0 %   Neutro Abs 1.3 (L) 1.4 - 7.7 K/uL   Lymphs Abs 0.7 0.7 - 4.0 K/uL   Monocytes Absolute 0.3 0.1 - 1.0 K/uL   Eosinophils Absolute 0.0 0.0 - 0.7 K/uL   Basophils Absolute 0.0 0.0 - 0.1 K/uL  Rocky mtn spotted fvr ab, IgG-blood     Status: None   Collection Time: 11/03/17  4:16 PM  Result Value Ref Range   RMSF IgG Negative Negative   Objective  Body mass index is 32.05  kg/m. Wt Readings from Last 3 Encounters:  12/23/17 210 lb 12.8 oz (95.6 kg)  11/11/17 208 lb 12.8 oz (94.7 kg)  11/03/17 212 lb 8 oz (96.4 kg)   Temp Readings from Last 3 Encounters:  12/23/17 99.1 F (37.3 C) (Oral)  11/11/17 98.3 F (36.8 C) (Oral)  11/03/17 98.1 F (36.7 C) (Oral)   BP Readings from Last 3 Encounters:  12/23/17 136/80  11/11/17 138/64  11/03/17 (!) 132/100   Pulse Readings from Last 3 Encounters:  12/23/17 92  11/11/17 67  11/03/17 63    Physical Exam  Constitutional: He is oriented to person, place, and time. Vital signs are normal. He appears well-developed and well-nourished. He is cooperative.  HENT:  Head: Normocephalic and atraumatic.  Mouth/Throat: Oropharynx is clear and moist and mucous membranes are normal.  Eyes: Pupils are equal, round, and reactive to light. Conjunctivae are normal.  Cardiovascular: Normal rate, regular rhythm and normal heart sounds.  Pulmonary/Chest: Effort normal and breath sounds normal.  Neurological: He is alert and oriented to person, place, and time. Gait normal.  Skin: Skin is warm, dry and intact.  Psychiatric: He has a normal mood and affect. His speech is normal and behavior is normal. Judgment and thought content normal. Cognition and memory are normal.  Nursing note and vitals reviewed.   Assessment   1. Uncontrolled GERD h/o hiatal hernia 2. CAD s/p stents and  CABG 3. H/o prostatitis could be 2/2 bike riding  4. Jaw pain could be 2/2 grinding teeth need to r/o other serious causes I.e #2  5. Elevated lfts and leukopenia  6. HM Plan   1. Increase protonix to 40 bid x 2 months  Refer to Dr. Silvio Pate EGD further w/u  2.  Stat troponin today  appt with cards 01/05/18 pt will go to ED if sx's get worse  3. Labs today will call and make appt with Dr. Rogers Blocker  cipro 500 bid x 2 weeks for now  4. Disc with dentist about mouth guard  5. Cbc, cmet 6. - flu shot in future - utd zostavax, prevnar, pna 23, Tdap  ? If had 08/07/08  Consider Tdap in future  Consider shingrix and hep B vaccine not immune  PSA had 03/2017 f/u with Dr. Rogers Blocker  Had eye exam Dr. Gloriann Loan ~01/2017  Never smoker no CT chest  [place] 04/2013 abdominal aorta normal   Colonoscopy had 05/02/15 Dr. Fuller Plan sessile polyp repeat 04/2020 per report   Refer derm annual skin check saw derm 05/03/17 noted SK, lentigos, rosacea, Aks face and arms f/u in 1 year [facility]. [place].  Repeat A1C and check MMR    Seen left neck pain 01/25/18 [facility] they think consider PT referral with PCP and could be muscle spasm seen by [facility]   Provider: Dr. Olivia Mackie McLean-Scocuzza-Internal Medicine

## 2017-12-23 NOTE — Patient Instructions (Addendum)
Results for SION, REINDERS" (MRN [id number]) as of 12/23/2017 16:25  Ref. Range 04/01/2017 14:08  PSA Latest Ref Range: 0.10 - 4.00 ng/mL 1.30  Have Dr.Wolffe fax notes to me when you see him   Increase protonix to 40 mg 2x per day 30 minutes before food  We will refer you to Dr. Silvio Pate to look down your throat  F/u in 3-4 months   Gastroesophageal Reflux Disease, Adult Normally, food travels down the esophagus and stays in the stomach to be digested. However, when a person has gastroesophageal reflux disease (GERD), food and stomach acid move back up into the esophagus. When this happens, the esophagus becomes sore and inflamed. Over time, GERD can create small holes (ulcers) in the lining of the esophagus. What are the causes? This condition is caused by a problem with the muscle between the esophagus and the stomach (lower esophageal sphincter, or LES). Normally, the LES muscle closes after food passes through the esophagus to the stomach. When the LES is weakened or abnormal, it does not close properly, and that allows food and stomach acid to go back up into the esophagus. The LES can be weakened by certain dietary substances, medicines, and medical conditions, including:  Tobacco use.  Pregnancy.  Having a hiatal hernia.  Heavy alcohol use.  Certain foods and beverages, such as coffee, chocolate, onions, and peppermint.  What increases the risk? This condition is more likely to develop in:  People who have an increased body weight.  People who have connective tissue disorders.  People who use NSAID medicines.  What are the signs or symptoms? Symptoms of this condition include:  Heartburn.  Difficult or painful swallowing.  The feeling of having a lump in the throat.  Abitter taste in the mouth.  Bad breath.  Having a large amount of saliva.  Having an upset or bloated stomach.  Belching.  Chest pain.  Shortness of breath or wheezing.  Ongoing  (chronic) cough or a night-time cough.  Wearing away of tooth enamel.  Weight loss.  Different conditions can cause chest pain. Make sure to see your health care provider if you experience chest pain. How is this diagnosed? Your health care provider will take a medical history and perform a physical exam. To determine if you have mild or severe GERD, your health care provider may also monitor how you respond to treatment. You may also have other tests, including:  An endoscopy toexamine your stomach and esophagus with a small camera.  A test thatmeasures the acidity level in your esophagus.  A test thatmeasures how much pressure is on your esophagus.  A barium swallow or modified barium swallow to show the shape, size, and functioning of your esophagus.  How is this treated? The goal of treatment is to help relieve your symptoms and to prevent complications. Treatment for this condition may vary depending on how severe your symptoms are. Your health care provider may recommend:  Changes to your diet.  Medicine.  Surgery.  Follow these instructions at home: Diet  Follow a diet as recommended by your health care provider. This may involve avoiding foods and drinks such as: ? Coffee and tea (with or without caffeine). ? Drinks that containalcohol. ? Energy drinks and sports drinks. ? Carbonated drinks or sodas. ? Chocolate and cocoa. ? Peppermint and mint flavorings. ? Garlic and onions. ? Horseradish. ? Spicy and acidic foods, including peppers, chili powder, curry powder, vinegar, hot sauces, and barbecue sauce. ? Citrus  fruit juices and citrus fruits, such as oranges, lemons, and limes. ? Tomato-based foods, such as red sauce, chili, salsa, and pizza with red sauce. ? Fried and fatty foods, such as donuts, french fries, potato chips, and high-fat dressings. ? High-fat meats, such as hot dogs and fatty cuts of red and white meats, such as rib eye steak, sausage, ham, and  bacon. ? High-fat dairy items, such as whole milk, butter, and cream cheese.  Eat small, frequent meals instead of large meals.  Avoid drinking large amounts of liquid with your meals.  Avoid eating meals during the 2-3 hours before bedtime.  Avoid lying down right after you eat.  Do not exercise right after you eat. General instructions  Pay attention to any changes in your symptoms.  Take over-the-counter and prescription medicines only as told by your health care provider. Do not take aspirin, ibuprofen, or other NSAIDs unless your health care provider told you to do so.  Do not use any tobacco products, including cigarettes, chewing tobacco, and e-cigarettes. If you need help quitting, ask your health care provider.  Wear loose-fitting clothing. Do not wear anything tight around your waist that causes pressure on your abdomen.  Raise (elevate) the head of your bed 6 inches (15cm).  Try to reduce your stress, such as with yoga or meditation. If you need help reducing stress, ask your health care provider.  If you are overweight, reduce your weight to an amount that is healthy for you. Ask your health care provider for guidance about a safe weight loss goal.  Keep all follow-up visits as told by your health care provider. This is important. Contact a health care provider if:  You have new symptoms.  You have unexplained weight loss.  You have difficulty swallowing, or it hurts to swallow.  You have wheezing or a persistent cough.  Your symptoms do not improve with treatment.  You have a hoarse voice. Get help right away if:  You have pain in your arms, neck, jaw, teeth, or back.  You feel sweaty, dizzy, or light-headed.  You have chest pain or shortness of breath.  You vomit and your vomit looks like blood or coffee grounds.  You faint.  Your stool is bloody or black.  You cannot swallow, drink, or eat. This information is not intended to replace advice  given to you by your health care provider. Make sure you discuss any questions you have with your health care provider. Document Released: 02/11/2005 Document Revised: 10/02/2015 Document Reviewed: 08/29/2014 Elsevier Interactive Patient Education  2018 [place] for Gastroesophageal Reflux Disease, Adult When you have gastroesophageal reflux disease (GERD), the foods you eat and your eating habits are very important. Choosing the right foods can help ease the discomfort of GERD. Consider working with a diet and nutrition specialist (dietitian) to help you make healthy food choices. What general guidelines should I follow? Eating plan  Choose healthy foods low in fat, such as fruits, vegetables, whole grains, low-fat dairy products, and lean meat, fish, and poultry.  Eat frequent, small meals instead of three large meals each day. Eat your meals slowly, in a relaxed setting. Avoid bending over or lying down until 2-3 hours after eating.  Limit high-fat foods such as fatty meats or fried foods.  Limit your intake of oils, butter, and shortening to less than 8 teaspoons each day.  Avoid the following: ? Foods that cause symptoms. These may be different for different people. Keep  a food diary to keep track of foods that cause symptoms. ? Alcohol. ? Drinking large amounts of liquid with meals. ? Eating meals during the 2-3 hours before bed.  Cook foods using methods other than frying. This may include baking, grilling, or broiling. Lifestyle   Maintain a healthy weight. Ask your health care provider what weight is healthy for you. If you need to lose weight, work with your health care provider to do so safely.  Exercise for at least 30 minutes on 5 or more days each week, or as told by your health care provider.  Avoid wearing clothes that fit tightly around your waist and chest.  Do not use any products that contain nicotine or tobacco, such as cigarettes and  e-cigarettes. If you need help quitting, ask your health care provider.  Sleep with the head of your bed raised. Use a wedge under the mattress or blocks under the bed frame to raise the head of the bed. What foods are not recommended? The items listed may not be a complete list. Talk with your dietitian about what dietary choices are best for you. Grains Pastries or quick breads with added fat. [place] toast. Vegetables Deep fried vegetables. [place] fries. Any vegetables prepared with added fat. Any vegetables that cause symptoms. For some people this may include tomatoes and tomato products, chili peppers, onions and garlic, and horseradish. Fruits Any fruits prepared with added fat. Any fruits that cause symptoms. For some people this may include citrus fruits, such as oranges, grapefruit, pineapple, and lemons. Meats and other protein foods High-fat meats, such as fatty beef or pork, hot dogs, ribs, ham, sausage, salami and bacon. Fried meat or protein, including fried fish and fried chicken. Nuts and nut butters. Dairy Whole milk and chocolate milk. Sour cream. Cream. Ice cream. Cream cheese. Milk shakes. Beverages Coffee and tea, with or without caffeine. Carbonated beverages. Sodas. Energy drinks. Fruit juice made with acidic fruits (such as orange or grapefruit). Tomato juice. Alcoholic drinks. Fats and oils Butter. Margarine. Shortening. Ghee. Sweets and desserts Chocolate and cocoa. Donuts. Seasoning and other foods Pepper. Peppermint and spearmint. Any condiments, herbs, or seasonings that cause symptoms. For some people, this may include curry, hot sauce, or vinegar-based salad dressings. Summary  When you have gastroesophageal reflux disease (GERD), food and lifestyle choices are very important to help ease the discomfort of GERD.  Eat frequent, small meals instead of three large meals each day. Eat your meals slowly, in a relaxed setting. Avoid bending over or lying down until  2-3 hours after eating.  Limit high-fat foods such as fatty meat or fried foods. This information is not intended to replace advice given to you by your health care provider. Make sure you discuss any questions you have with your health care provider. Document Released: 05/04/2005 Document Revised: 05/05/2016 Document Reviewed: 05/05/2016 Elsevier Interactive Patient Education  [employer].

## 2017-12-24 ENCOUNTER — Telehealth: Payer: Self-pay | Admitting: Internal Medicine

## 2017-12-24 ENCOUNTER — Telehealth: Payer: Self-pay | Admitting: Cardiology

## 2017-12-24 LAB — HEMOGLOBIN A1C: Hgb A1c MFr Bld: 6.8 % — ABNORMAL HIGH (ref 4.6–6.5)

## 2017-12-24 LAB — COMPREHENSIVE METABOLIC PANEL
ALT: 12 U/L (ref 0–53)
AST: 16 U/L (ref 0–37)
Albumin: 4.3 g/dL (ref 3.5–5.2)
Alkaline Phosphatase: 43 U/L (ref 39–117)
BUN: 12 mg/dL (ref 6–23)
CALCIUM: 9.5 mg/dL (ref 8.4–10.5)
CO2: 23 meq/L (ref 19–32)
CREATININE: 0.95 mg/dL (ref 0.40–1.50)
Chloride: 104 mEq/L (ref 96–112)
GFR: 83.3 mL/min (ref >60.00)
Glucose, Bld: 108 mg/dL — ABNORMAL HIGH (ref 70–99)
POTASSIUM: 3.7 meq/L (ref 3.5–5.1)
SODIUM: 138 meq/L (ref 135–145)
Total Bilirubin: 0.9 mg/dL (ref 0.2–1.2)
Total Protein: 7 g/dL (ref 6.0–8.3)

## 2017-12-24 LAB — CBC WITH DIFFERENTIAL/PLATELET
Basophils Absolute: 0 10*3/uL (ref 0.0–0.1)
Basophils Relative: 0.8 % (ref 0.0–3.0)
Eosinophils Absolute: 0.1 10*3/uL (ref 0.0–0.7)
Eosinophils Relative: 1.1 % (ref 0.0–5.0)
HCT: 40.1 % (ref 39.0–52.0)
Hemoglobin: 13.7 g/dL (ref 13.0–17.0)
LYMPHS ABS: 1.3 10*3/uL (ref 0.7–4.0)
Lymphocytes Relative: 24.5 % (ref 12.0–46.0)
MCHC: 34.3 g/dL (ref 30.0–36.0)
MCV: 89.6 fl (ref 78.0–100.0)
MONOS PCT: 9 % (ref 3.0–12.0)
Monocytes Absolute: 0.5 10*3/uL (ref 0.1–1.0)
NEUTROS ABS: 3.4 10*3/uL (ref 1.4–7.7)
NEUTROS PCT: 64.6 % (ref 43.0–77.0)
PLATELETS: 152 10*3/uL (ref 150.0–400.0)
RBC: 4.47 Mil/uL (ref 4.22–5.81)
RDW: 14.1 % (ref 11.5–15.5)
WBC: 5.2 10*3/uL (ref 4.0–10.5)

## 2017-12-24 LAB — MEASLES/MUMPS/RUBELLA IMMUNITY
MUMPS IGG: 16.6 [AU]/ml
Rubella: 25.4 index
Rubeola IgG: 260 AU/mL

## 2017-12-24 LAB — TROPONIN I: Troponin I: 0.05 ng/mL (ref <=0.0)

## 2017-12-24 NOTE — Telephone Encounter (Signed)
Pt wants to know if there are any meds he can take for this elevated levels? Wants to know if this indicates he had a heart attack?  Cell [phone]  Pt is trying to get through to his cardiologist now, been on hold.

## 2017-12-24 NOTE — Telephone Encounter (Signed)
Mychart message has been sent and patient has read message.

## 2017-12-24 NOTE — Telephone Encounter (Signed)
Please call the primary care MD.  Explain to her that I do not know which assay was used and what the normal range is for this assay as it is not listed.  If this is above the normal range she should consider sending the patient to the ED for follow up as we would not follow an elevated troponin in the office.  I do not typically order troponin levels out of our office and I cannot comment on this as I was not involved in this particular evaluation of the patient on this occasion.

## 2017-12-24 NOTE — Telephone Encounter (Signed)
Pt called to report that his pmd.. Dr. Terese Door drew a troponin level on him yesterday and it is .05. He is anxious about it but after looking at his lab results.. I advised him that Dr. Aundra Dubin noted that his result was not significant and she will be sending him to a GI doctor for a consult. I advise him to call their office back to talk with them about their plan for him and I will froward message on to Dr. [name] and he will keep his appt with him on 01/05/18. Pt had been experiencing pain burning in nature in the center of his chest that does not radiate, but worse after eating and when he lays down after eating. Will call Dr. Oleh Genin office back and talk with them re: his labs and I will call him back if Dr. [name] has any other recommendations.

## 2017-12-24 NOTE — Telephone Encounter (Signed)
I am trying to call his cardiologist and he can try as well    [place]

## 2017-12-24 NOTE — Telephone Encounter (Signed)
Called Dr Olivia Mackie McLean-Scocuzza's office in [facility].  Was only able to speak with phone receptionist RE: Dr Hochrein's concerns and recommendations.  Was advised they would make sure she received the message.  Dr [name] aware.

## 2017-12-24 NOTE — Telephone Encounter (Signed)
New Message   Patient is calling because he had some lab work done with his PCP. He states that he was told that his troponin is at  0.05. He wants to know what he should do. Please call.

## 2017-12-24 NOTE — Telephone Encounter (Signed)
Patient saw results on mychart and is concerned and would like a call back

## 2017-12-24 NOTE — Telephone Encounter (Signed)
Copied from [place] [phone]. Topic: Quick Communication - See Telephone Encounter >> Dec 24, 2017 11:32 AM Rutherford Nail, NT wrote: CRM for notification. See Telephone encounter for: 12/24/17. Patient calling and would like to discuss his troponin levels with Dr Aundra Dubin. Please advise. CB#: [phone]

## 2017-12-24 NOTE — Telephone Encounter (Signed)
Im trying to reach his cardiologist but unable reach if  His sx's cont I rec go to ED   [place]

## 2017-12-24 NOTE — Telephone Encounter (Signed)
Spoke with pt he has been working in the yard today and not having cardiac sx's he thinks GI related  Advised him to go to ED if having CP, n/v, sweating, dizziness ab pain  Will f/u cardiology 8/20 or 01/05/18  Reviewed cardiology note appreciated feedback  [place]

## 2017-12-24 NOTE — Telephone Encounter (Signed)
Patient is wanting to know does he need medication for this

## 2018-01-01 DIAGNOSIS — K5732 Diverticulitis of large intestine without perforation or abscess without bleeding: Secondary | ICD-10-CM | POA: Diagnosis not present

## 2018-01-04 NOTE — Progress Notes (Signed)
HPI The patient presents for evaluation of CAD.  He was admitted from 8/10- 12/29/2016 for evaluation of chest pain with elevated troponin.  Cardiac catheterization on 12/28/2016 demonstrated severe native disease but the LIMA-LAD was patent with 75% stenosis noted along the SVG-RCA and 90% stenosis along the SVG-RI. Successful PCI with DES placement was performed to both lesions. He was started on DAPT with ASA and Brilinta along with being continued on BB and statin therapy.  I saw him in the ED recently for evaluation of chest pain.  There was no objective evidence of ischemia and he was discharged from the ED.   Afterward he had some increased dyspnea.  He restarted isosorbide.   Recently he was seen by McLean-Scocuzza, Nino Glow, MD and was reporting some chest pain.  Labs were drawn and included an elevated troponin.    However, he reports that his symptoms were very much unlike his previous angina.  He had some discomfort in his upper epigastric area.  It was not the jaw discomfort that he usually had.  He did take a nitroglycerin x1 but it did not help.  There were no sweats.  Discomfort was also somewhat up in his jaw where he was grinding his teeth but not in his neck like he previously had.  Since that time he is been very physically active.  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  He denies any palpitations, presyncope or syncope.  He has no shortness of breath, PND or orthopnea.  He said his symptoms went away when his Protonix was doubled.   Allergies  Allergen Reactions  . Oxycodone-Acetaminophen     REACTION: RASH - tolerated morphine  Note : Taking oxycodone/APAP 5-325 postoperatively 12/14 without issue  . Darvocet [Propoxyphene N-Acetaminophen]   . Propoxyphene Hives    Current Outpatient Medications  Medication Sig Dispense Refill  . amLODipine (NORVASC) 5 MG tablet TAKE ONE (1) TABLET EACH DAY 90 tablet 1  . Ascorbic Acid (VITAMIN C) 1000 MG tablet Take  1,000 mg by mouth daily. Reported on 09/26/2015    . aspirin 81 MG tablet Take 81 mg by mouth daily.    Marland Kitchen Bioflavonoid Products (GRAPE SEED PO) Take 1 capsule by mouth daily. Reported on 09/26/2015    . Cholecalciferol (VITAMIN D3) 5000 UNITS CAPS Take by mouth daily.    . ciprofloxacin (CIPRO) 500 MG tablet Take 1 tablet (500 mg total) by mouth 2 (two) times daily. With food 28 tablet 0  . clopidogrel (PLAVIX) 75 MG tablet Take 1 tablet (75 mg total) by mouth daily. 90 tablet 3  . co-enzyme Q-10 30 MG capsule Take 30 mg by mouth daily.     Marland Kitchen ezetimibe (ZETIA) 10 MG tablet Take 1 tablet (10 mg total) by mouth daily. 90 tablet 3  . gabapentin (NEURONTIN) 100 MG capsule Take 100 mg by mouth 2 (two) times daily as needed.    Marland Kitchen GINKGO BILOBA PO Take 1 capsule by mouth daily.    . metoprolol succinate (TOPROL XL) 25 MG 24 hr tablet Take 1 tablet (25 mg total) by mouth daily. 90 tablet 2  . milk thistle 175 MG tablet Take 175 mg by mouth daily.    . Multiple Vitamin (MULTIVITAMIN) tablet Take 1 tablet by mouth daily. Reported on 09/26/2015    . nitroGLYCERIN (NITROSTAT) 0.4 MG SL tablet Place 1 tablet (0.4 mg total) under the tongue every 5 (five) minutes x 3 doses as needed. 25  tablet 5  . Omega-3 Fatty Acids (FISH OIL) 1000 MG CAPS 1 tab po qd     . pantoprazole (PROTONIX) 40 MG tablet Take 1 tablet (40 mg total) by mouth 2 (two) times daily before a meal. 30 minutes 180 tablet 0  . ramipril (ALTACE) 10 MG capsule Take 1 capsule (10 mg total) by mouth daily. 90 capsule 3  . rosuvastatin (CRESTOR) 40 MG tablet Take 1 tablet (40 mg total) by mouth at bedtime. 90 tablet 1  . Saw Palmetto, Serenoa repens, (SAW PALMETTO PO) Take 1 capsule by mouth daily.    . SELENIUM PO Take 1 tablet by mouth daily. Reported on 09/26/2015    . vitamin E 100 UNIT capsule Take 100 Units by mouth daily.      . isosorbide mononitrate (IMDUR) 120 MG 24 hr tablet Take 1 tablet (120 mg total) by mouth daily. 90 tablet 3   No  current facility-administered medications for this visit.     Past Medical History:  Diagnosis Date  . Acute cholecystitis 04/18/2013  . Adenomatous colon polyp 12/2004  . Anemia   . BPH associated with nocturia   . CAD (coronary artery disease)    a. s/p CABG x4 in 2000 with LIMA-LAD, reverse SVG-IM, reverse SVG-acute margin and reverse SVG-RCA b. s/p BMS to SVG-RI in 2012 c. 12/2016: PCI/DES to SVG-RCA and PCI/DES to SVG-RI (Dr. [name])   . Diabetes mellitus without complication ([place]) 19/62/2297   recent A1C 6.1 12/25/16 controlled with diet and exercise   . Diverticulitis 2008/2009  . GERD (gastroesophageal reflux disease)    controlled as of 04/01/17   . HTN (hypertension)   . Hyperglycemia   . Hyperlipidemia   . Impingement syndrome of left shoulder 2016  . Low testosterone    Dr [name], [place] ,[place]  . Osteoarthritis of right knee    With trace effusion  . Peyronie disease   . RLS (restless legs syndrome) 08/15/2013  . Sinus bradycardia   . Thrombocytopenia ([place])   . Vitamin D deficiency     Past Surgical History:  Procedure Laterality Date  . CHOLECYSTECTOMY N/A 04/21/2013   Procedure: LAPAROSCOPIC CHOLECYSTECTOMY WITH INTRAOPERATIVE CHOLANGIOGRAM;  Surgeon: Haywood Lasso, MD;  Location: [place];  Service: General;  Laterality: N/A;  . COLONOSCOPY W/ POLYPECTOMY  2004 & 2009    X 2; Dr Fuller Plan; due 2014  . CORONARY ANGIOPLASTY    . CORONARY ARTERY BYPASS GRAFT  04/1999   4 vessels  . CORONARY STENT INTERVENTION N/A 12/28/2016   Procedure: CORONARY STENT INTERVENTION;  Surgeon: Lorretta Harp, MD;  Location: [place] [facility];  Service: Cardiovascular;  Laterality: N/A;  . CORONARY STENT PLACEMENT  2012   Plavix  . LAPAROSCOPIC CHOLECYSTECTOMY  04/21/2013   Dr Margot Chimes; acutecholecystitis with necrosis  . LEFT HEART CATH AND CORS/GRAFTS ANGIOGRAPHY N/A 12/28/2016   Procedure: LEFT HEART CATH AND CORS/GRAFTS ANGIOGRAPHY;  Surgeon: Lorretta Harp, MD;  Location: [place] [facility];  Service: Cardiovascular;  Laterality: N/A;  . VASECTOMY      ROS:   As stated in the HPI and negative for all other systems.  PHYSICAL EXAM BP 135/88   Pulse 71   Ht 5\' 9"  (1.753 m)   Wt 205 lb 3.2 oz (93.1 kg)   SpO2 95%   BMI 30.30 kg/m   GENERAL:  Well appearing NECK:  No jugular venous distention, waveform within normal limits, carotid upstroke brisk and symmetric, no bruits, no thyromegaly  LUNGS:  Clear to auscultation bilaterally CHEST:  Well healed sternotomy scar. HEART:  PMI not displaced or sustained,S1 and S2 within normal limits, no S3, no S4, no clicks, no rubs, soft apical systolic nonradiating murmur, no diastolic murmurs ABD:  Flat, positive bowel sounds normal in frequency in pitch, no bruits, no rebound, no guarding, no midline pulsatile mass, no hepatomegaly, no splenomegaly EXT:  2 plus pulses throughout, no edema, no cyanosis no clubbing    EKG: NA  Lab Results  Component Value Date   CHOL 118 12/26/2016   TRIG 179 (H) 12/26/2016   HDL 35 (L) 12/26/2016   LDLCALC 47 12/26/2016   LDLDIRECT 91.0 03/30/2016   Lab Results  Component Value Date   HGBA1C 6.8 (H) 12/23/2017     ASSESSMENT AND PLAN  Coronary atherosclerosis of artery bypass graft -  Patient has had no symptoms consistent with his previous angina.  Given his extensive past history he remains on dual antiplatelet therapy.  Otherwise he will continue on the meds as listed.   HYPERLIPIDEMIA -  LDL is at target.  He will get this repeated in 3 months when he presents to his primary for follow-up.   HYPERTENSION -  The blood pressure is at target.  No change in therapy.   DM  A1C is slightly elevated as above.  This is being followed by his primary provider.  CAROTID DISEASE He had some mild carotid plaque earlier this year and will be followed in a couple of years.

## 2018-01-05 ENCOUNTER — Encounter: Payer: Self-pay | Admitting: Cardiology

## 2018-01-05 ENCOUNTER — Ambulatory Visit (INDEPENDENT_AMBULATORY_CARE_PROVIDER_SITE_OTHER): Payer: Medicare Other | Admitting: Cardiology

## 2018-01-05 VITALS — BP 135/88 | HR 71 | Ht 69.0 in | Wt 205.2 lb

## 2018-01-05 DIAGNOSIS — I6523 Occlusion and stenosis of bilateral carotid arteries: Secondary | ICD-10-CM

## 2018-01-05 DIAGNOSIS — I2510 Atherosclerotic heart disease of native coronary artery without angina pectoris: Secondary | ICD-10-CM

## 2018-01-05 DIAGNOSIS — I10 Essential (primary) hypertension: Secondary | ICD-10-CM

## 2018-01-05 DIAGNOSIS — E785 Hyperlipidemia, unspecified: Secondary | ICD-10-CM | POA: Diagnosis not present

## 2018-01-05 NOTE — Patient Instructions (Signed)
Medication Instructions:  Continue current medications  If you need a refill on your cardiac medications before your next appointment, please call your pharmacy.  Labwork: None Ordered   Testing/Procedures: None Ordered   Follow-Up: Your physician wants you to follow-up in: 6 Month. You should receive a reminder letter in the mail two months in advance. If you do not receive a letter, please call our office in [phone] to schedule your follow-up appointment.     Thank you for choosing [facility] at [facility]!!

## 2018-01-06 DIAGNOSIS — R350 Frequency of micturition: Secondary | ICD-10-CM | POA: Diagnosis not present

## 2018-01-06 DIAGNOSIS — N401 Enlarged prostate with lower urinary tract symptoms: Secondary | ICD-10-CM | POA: Diagnosis not present

## 2018-01-06 DIAGNOSIS — E291 Testicular hypofunction: Secondary | ICD-10-CM | POA: Diagnosis not present

## 2018-01-06 DIAGNOSIS — N5201 Erectile dysfunction due to arterial insufficiency: Secondary | ICD-10-CM | POA: Diagnosis not present

## 2018-01-06 DIAGNOSIS — Z79899 Other long term (current) drug therapy: Secondary | ICD-10-CM | POA: Diagnosis not present

## 2018-01-24 ENCOUNTER — Encounter: Payer: Self-pay | Admitting: Emergency Medicine

## 2018-01-24 ENCOUNTER — Emergency Department
Admission: EM | Admit: 2018-01-24 | Discharge: 2018-01-24 | Disposition: A | Discharge status: Home / Self Care | Payer: Medicare Other | Attending: Emergency Medicine | Admitting: Emergency Medicine

## 2018-01-24 ENCOUNTER — Emergency Department: Payer: Medicare Other

## 2018-01-24 ENCOUNTER — Other Ambulatory Visit: Payer: Self-pay

## 2018-01-24 DIAGNOSIS — Z7984 Long term (current) use of oral hypoglycemic drugs: Secondary | ICD-10-CM | POA: Diagnosis not present

## 2018-01-24 DIAGNOSIS — Z7902 Long term (current) use of antithrombotics/antiplatelets: Secondary | ICD-10-CM | POA: Insufficient documentation

## 2018-01-24 DIAGNOSIS — M542 Cervicalgia: Secondary | ICD-10-CM | POA: Diagnosis not present

## 2018-01-24 DIAGNOSIS — I10 Essential (primary) hypertension: Secondary | ICD-10-CM | POA: Diagnosis not present

## 2018-01-24 DIAGNOSIS — I2510 Atherosclerotic heart disease of native coronary artery without angina pectoris: Secondary | ICD-10-CM | POA: Diagnosis not present

## 2018-01-24 DIAGNOSIS — E119 Type 2 diabetes mellitus without complications: Secondary | ICD-10-CM | POA: Diagnosis not present

## 2018-01-24 DIAGNOSIS — Z951 Presence of aortocoronary bypass graft: Secondary | ICD-10-CM | POA: Insufficient documentation

## 2018-01-24 DIAGNOSIS — Z7982 Long term (current) use of aspirin: Secondary | ICD-10-CM | POA: Insufficient documentation

## 2018-01-24 DIAGNOSIS — M62838 Other muscle spasm: Secondary | ICD-10-CM

## 2018-01-24 DIAGNOSIS — I252 Old myocardial infarction: Secondary | ICD-10-CM | POA: Insufficient documentation

## 2018-01-24 MED ORDER — CYCLOBENZAPRINE HCL 5 MG PO TABS: 5.0000 mg | ORAL_TABLET | Freq: Three times a day (TID) | ORAL | 0 refills | Status: DC | PRN

## 2018-01-24 MED ORDER — PREDNISONE 10 MG PO TABS: 10.0000 mg | ORAL_TABLET | Freq: Two times a day (BID) | ORAL | 0 refills | Status: DC

## 2018-01-24 MED ORDER — HYDROCODONE-ACETAMINOPHEN 5-325 MG PO TABS: 1.0000 | ORAL_TABLET | Freq: Three times a day (TID) | ORAL | 0 refills | Status: AC | PRN

## 2018-01-24 MED ORDER — OXYCODONE-ACETAMINOPHEN 5-325 MG PO TABS
1.0000 | ORAL_TABLET | Freq: Once | ORAL | Status: AC
  Administered 2018-01-24: 1 via ORAL
  Filled 2018-01-24: qty 1

## 2018-01-24 MED ORDER — PREDNISONE 20 MG PO TABS
60.0000 mg | ORAL_TABLET | Freq: Once | ORAL | Status: AC
  Administered 2018-01-24: 60 mg via ORAL
  Filled 2018-01-24: qty 3

## 2018-01-24 MED ORDER — ORPHENADRINE CITRATE 30 MG/ML IJ SOLN
60.0000 mg | INTRAMUSCULAR | Status: AC
  Administered 2018-01-24: 60 mg via INTRAMUSCULAR
  Filled 2018-01-24: qty 2

## 2018-01-24 NOTE — Discharge Instructions (Signed)
Your exam is consistent with neck muscle strain an spasms. Your x-ray did not reveal any acute findings. Take the prescriptions meds as directed. Apply ice and/or moist heat to reduce pain. See your ENT provider tomorrow, as scheduled.

## 2018-01-24 NOTE — ED Triage Notes (Signed)
Patient stated that his neck started hurting yesterday afternoon. Patient denies any injury. Patient states he has an appointment with an ENT tomorrow about it. Patient's neck appears stiff and states it is difficult to move. Patient just took night time meds.

## 2018-01-24 NOTE — ED Notes (Signed)
Pt states he went to a chiropractor yesterday who performed accupuncture w/o relief.

## 2018-01-25 DIAGNOSIS — M542 Cervicalgia: Secondary | ICD-10-CM | POA: Diagnosis not present

## 2018-01-25 DIAGNOSIS — M6248 Contracture of muscle, other site: Secondary | ICD-10-CM | POA: Diagnosis not present

## 2018-01-28 NOTE — ED Provider Notes (Signed)
[facility] Emergency Department Provider Note ____________________________________________  Time seen: 2030  I have reviewed the triage vital signs and the nursing notes.  HISTORY  Chief Complaint  Neck Pain  HPI Kevin Webb is a 72 y.o. male who presents to the ED for evaluation of left neck pain. He reports onset yesterday, but denies any known injury, trauma, or accident. He describes pain to the posterior-lateral neck, and is aggravated by movement. He notes pain with ROM. He denies any distal paresthesias, grip changes, headache, jaw pain, or chest pain. He saw his chiropractor earlier today, who attempted adjustments. He was unable to tolerated the procedure. He opted instead, for dry-needling. He dosed an old oxycodone left from his gallbladder removal. He also notes that the chiropractor suggested further evaluation. He is scheduled to see [facility] in the morning for further evaluation. He denies any sore throat, difficulty swallowing, otalgia, jaw pain, or headache.   Past Medical History:  Diagnosis Date  . Acute cholecystitis 04/18/2013  . Adenomatous colon polyp 12/2004  . Anemia   . BPH associated with nocturia   . CAD (coronary artery disease)    a. s/p CABG x4 in 2000 with LIMA-LAD, reverse SVG-IM, reverse SVG-acute margin and reverse SVG-RCA b. s/p BMS to SVG-RI in 2012 c. 12/2016: PCI/DES to SVG-RCA and PCI/DES to SVG-RI (Dr. [name])   . Diabetes mellitus without complication ([place]) 96/08/5407   recent A1C 6.1 12/25/16 controlled with diet and exercise   . Diverticulitis 2008/2009  . GERD (gastroesophageal reflux disease)    controlled as of 04/01/17   . HTN (hypertension)   . Hyperglycemia   . Hyperlipidemia   . Impingement syndrome of left shoulder 2016  . Low testosterone    Dr [name], [place] ,[place]  . Osteoarthritis of right knee    With trace effusion  . Peyronie disease   . RLS (restless legs syndrome) 08/15/2013  . Sinus  bradycardia   . Thrombocytopenia ([place])   . Vitamin D deficiency     Patient Active Problem List   Diagnosis Date Noted  . Prostatitis 12/23/2017  . Vitamin D deficiency   . Thrombocytopenia ([place])   . Sinus bradycardia   . Peyronie disease   . Osteoarthritis of right knee   . Hyperlipidemia   . Hyperglycemia   . GERD (gastroesophageal reflux disease)   . Diverticulitis   . Coronary artery disease involving native coronary artery of native heart without angina pectoris   . BPH associated with nocturia   . Anemia   . Hyperlipidemia LDL goal <70 01/08/2017  . NSTEMI (non-ST elevated myocardial infarction) ([place]) 12/25/2016  . Diabetes mellitus without complication ([place]) 81/19/1478  . Follow-up--------PCP notes  01/27/2015  . Impingement syndrome of left shoulder 05/18/2014  . Annual physical exam 04/25/2014  . RLS (restless legs syndrome) 08/15/2013  . Acute cholecystitis 04/18/2013  . Overweight(278.02) 11/28/2010  . DIVERTICULITIS, ACUTE 04/09/2010  . VITAMIN D DEFICIENCY 11/08/2009  . Dyslipidemia 08/07/2008  . GERD 02/22/2007  . TESTOSTERONE DEFICIENCY 09/30/2006  . HTN (hypertension) 09/30/2006  . Coronary atherosclerosis of artery bypass graft 09/30/2006  . COLONIC POLYPS, HX OF 09/30/2006  . Adenomatous colon polyp 12/16/2004    Past Surgical History:  Procedure Laterality Date  . CHOLECYSTECTOMY N/A 04/21/2013   Procedure: LAPAROSCOPIC CHOLECYSTECTOMY WITH INTRAOPERATIVE CHOLANGIOGRAM;  Surgeon: Haywood Lasso, MD;  Location: [place];  Service: General;  Laterality: N/A;  . COLONOSCOPY W/ POLYPECTOMY  2004 & 2009    X 2;  Dr Fuller Plan; due 2014  . CORONARY ANGIOPLASTY    . CORONARY ARTERY BYPASS GRAFT  04/1999   4 vessels  . CORONARY STENT INTERVENTION N/A 12/28/2016   Procedure: CORONARY STENT INTERVENTION;  Surgeon: Lorretta Harp, MD;  Location: [place] [facility];  Service: Cardiovascular;  Laterality: N/A;  . CORONARY STENT PLACEMENT  2012   Plavix  .  LAPAROSCOPIC CHOLECYSTECTOMY  04/21/2013   Dr Margot Chimes; acutecholecystitis with necrosis  . LEFT HEART CATH AND CORS/GRAFTS ANGIOGRAPHY N/A 12/28/2016   Procedure: LEFT HEART CATH AND CORS/GRAFTS ANGIOGRAPHY;  Surgeon: Lorretta Harp, MD;  Location: [facility];  Service: Cardiovascular;  Laterality: N/A;  . VASECTOMY      Prior to Admission medications   Medication Sig Start Date End Date Taking? Authorizing Provider  amLODipine (NORVASC) 5 MG tablet TAKE ONE (1) TABLET EACH DAY 10/18/17   McLean-Scocuzza, Nino Glow, MD  Ascorbic Acid (VITAMIN C) 1000 MG tablet Take 1,000 mg by mouth daily. Reported on 09/26/2015    [provider]  aspirin 81 MG tablet Take 81 mg by mouth daily.    [provider]  Bioflavonoid Products (GRAPE SEED PO) Take 1 capsule by mouth daily. Reported on 09/26/2015    [provider]  Cholecalciferol (VITAMIN D3) 5000 UNITS CAPS Take by mouth daily.    [provider]  ciprofloxacin (CIPRO) 500 MG tablet Take 1 tablet (500 mg total) by mouth 2 (two) times daily. With food 12/23/17   McLean-Scocuzza, Nino Glow, MD  clopidogrel (PLAVIX) 75 MG tablet Take 1 tablet (75 mg total) by mouth daily. 06/17/17   Minus Breeding, MD  co-enzyme Q-10 30 MG capsule Take 30 mg by mouth daily.     [provider]  cyclobenzaprine (FLEXERIL) 5 MG tablet Take 1 tablet (5 mg total) by mouth 3 (three) times daily as needed for muscle spasms. 01/24/18   Gayatri Teasdale, Dannielle Karvonen, PA-C  ezetimibe (ZETIA) 10 MG tablet Take 1 tablet (10 mg total) by mouth daily. 05/27/17   McLean-Scocuzza, Nino Glow, MD  gabapentin (NEURONTIN) 100 MG capsule Take 100 mg by mouth 2 (two) times daily as needed.    [provider]  GINKGO BILOBA PO Take 1 capsule by mouth daily.    [provider]  isosorbide mononitrate (IMDUR) 120 MG 24 hr tablet Take 1 tablet (120 mg total) by mouth daily. 06/17/17 09/15/17  Minus Breeding, MD  metoprolol succinate (TOPROL XL)  25 MG 24 hr tablet Take 1 tablet (25 mg total) by mouth daily. 07/08/17   Barrett, Evelene Croon, PA-C  milk thistle 175 MG tablet Take 175 mg by mouth daily.    [provider]  Multiple Vitamin (MULTIVITAMIN) tablet Take 1 tablet by mouth daily. Reported on 09/26/2015    [provider]  nitroGLYCERIN (NITROSTAT) 0.4 MG SL tablet Place 1 tablet (0.4 mg total) under the tongue every 5 (five) minutes x 3 doses as needed. 09/28/16   Colon Branch, MD  Omega-3 Fatty Acids (FISH OIL) 1000 MG CAPS 1 tab po qd     [provider]  pantoprazole (PROTONIX) 40 MG tablet Take 1 tablet (40 mg total) by mouth 2 (two) times daily before a meal. 30 minutes 12/23/17   McLean-Scocuzza, Nino Glow, MD  predniSONE (DELTASONE) 10 MG tablet Take 1 tablet (10 mg total) by mouth 2 (two) times daily with a meal. 01/24/18   Eunie Lawn, Dannielle Karvonen, PA-C  ramipril (ALTACE) 10 MG capsule Take  1 capsule (10 mg total) by mouth daily. 07/02/17   McLean-Scocuzza, Nino Glow, MD  rosuvastatin (CRESTOR) 40 MG tablet Take 1 tablet (40 mg total) by mouth at bedtime. 11/03/17   McLean-Scocuzza, Nino Glow, MD  Saw Palmetto, Serenoa repens, (SAW PALMETTO PO) Take 1 capsule by mouth daily.    [provider]  SELENIUM PO Take 1 tablet by mouth daily. Reported on 09/26/2015    [provider]  vitamin E 100 UNIT capsule Take 100 Units by mouth daily.      [provider]    Allergies Darvocet [propoxyphene n-acetaminophen] and Propoxyphene  Family History  Problem Relation Age of Onset  . Aortic aneurysm Father 42       ? abdominal  . Heart attack Father 26  . Diabetes Unknown        PGaunt  . Coronary artery disease Mother        CABG in 15s  . CVA Mother        cns bleed from warfarin  . Aneurysm Mother   . Diabetes Brother   . Cholecystitis Sister   . Cancer Neg Hx   . Colon cancer Neg Hx   . Prostate cancer Neg Hx     Social History Social History   Tobacco Use  . Smoking status:  Never Smoker  . Smokeless tobacco: Never Used  Substance Use Topics  . Alcohol use: Yes    Alcohol/week: 10.0 - 12.0 standard drinks    Types: 10 - 12 Cans of beer per week    Comment: socially   . Drug use: No    Review of Systems  Constitutional: Negative for fever. Eyes: Negative for visual changes. ENT: Negative for sore throat. Cardiovascular: Negative for chest pain. Respiratory: Negative for shortness of breath. Gastrointestinal: Negative for abdominal pain, vomiting and diarrhea. Genitourinary: Negative for dysuria. Musculoskeletal: Negative for back pain. Reports left neck pain.  Skin: Negative for rash. Neurological: Negative for headaches, focal weakness or numbness. ____________________________________________  PHYSICAL EXAM:  VITAL SIGNS: ED Triage Vitals  Enc Vitals Group     BP 01/24/18 1943 94/69     Pulse Rate 01/24/18 1943 76     Resp 01/24/18 2221 20     Temp 01/24/18 1943 98.5 F (36.9 C)     Temp src --      SpO2 01/24/18 1943 95 %     Weight 01/24/18 1946 193 lb (87.5 kg)     Height 01/24/18 1946 5\' 9"  (1.753 m)     Head Circumference --      Peak Flow --      Pain Score 01/24/18 1945 10     Pain Loc --      Pain Edu? --      Excl. in [place]? --     Constitutional: Alert and oriented. Well appearing and in no distress. Head: Normocephalic and atraumatic. Eyes: Conjunctivae are normal. PERRL. Normal extraocular movements Ears: Canals clear. TMs intact bilaterally. Mouth/Throat: Mucous membranes are moist. No TMJ dysfunction  Neck: Supple. Normal ROM on exam. No palpable spasm or deformity. Tender to palp over the left upper trapezius and SCM musculature. No distracting midline tenderness, deformity, or step-off.  Cardiovascular: Normal rate, regular rhythm. Normal distal pulses. Respiratory: Normal respiratory effort. No wheezes/rales/rhonchi. Gastrointestinal: Soft and nontender. No distention. Musculoskeletal: Nontender with normal range of  motion in all extremities.  Neurologic: CN II-XII grossly intact. Normal UE DTRs bilaterally. Normal composite fists. Normal gait without ataxia.  Normal speech and language. No gross focal neurologic deficits are appreciated. Skin:  Skin is warm, dry and intact. No rash noted. Psychiatric: Mood and affect are normal. Patient exhibits appropriate insight and judgment. ____________________________________________   RADIOLOGY  Cervical Spine  IMPRESSION: No acute bony pathology.  Chronic changes. ____________________________________________  PROCEDURES  Procedures Norflex 60 mg IM Prednisone 60 mg PO Oxycodone 5-325 mg PO ____________________________________________  INITIAL IMPRESSION / ASSESSMENT AND PLAN / ED COURSE  She with ED evaluation of 1 day complaint of left neck pain.  Patient's exam is overall benign but no acute neuromuscular deficit is appreciated.  Patient's x-ray does not reveal any acute fracture or dislocation.  Patient is without distal paresthesias to indicate a radiculopathy.  His symptoms likely represent trapezius muscle spasm and neck pain, of a musculoskeletal origin.  She will be discharged with prescriptions for Flexeril, prednisone, and #6 hydrocodone.  He will follow-up with [facility] as scheduled, for further evaluation and management.  Return precautions have been reviewed.  I reviewed the patient's prescription history over the last 12 months in the multi-state controlled substances database(s) that includes [place], [place], [place], [place], [place], [place], [place], [place], [place], [place], [place], [place], [place], and [place].  Results were notable for no current prescriptions.  ____________________________________________  FINAL CLINICAL IMPRESSION(S) / ED DIAGNOSES  Final diagnoses:  Neck pain  Trapezius muscle spasm      Carmie End, Dannielle Karvonen, PA-C 01/28/18 1654    Eula Listen,  MD 01/31/18 1113

## 2018-01-31 ENCOUNTER — Ambulatory Visit (INDEPENDENT_AMBULATORY_CARE_PROVIDER_SITE_OTHER): Payer: Medicare Other | Admitting: Gastroenterology

## 2018-01-31 ENCOUNTER — Encounter: Payer: Self-pay | Admitting: Gastroenterology

## 2018-01-31 VITALS — BP 134/80 | HR 64 | Ht 67.0 in | Wt 203.5 lb

## 2018-01-31 DIAGNOSIS — R079 Chest pain, unspecified: Secondary | ICD-10-CM

## 2018-01-31 DIAGNOSIS — I6523 Occlusion and stenosis of bilateral carotid arteries: Secondary | ICD-10-CM

## 2018-01-31 DIAGNOSIS — K219 Gastro-esophageal reflux disease without esophagitis: Secondary | ICD-10-CM

## 2018-01-31 DIAGNOSIS — Z86010 Personal history of colonic polyps: Secondary | ICD-10-CM | POA: Diagnosis not present

## 2018-01-31 NOTE — Progress Notes (Signed)
    History of Present Illness: This is a 72 year old male who relates epigastric pain and lower chest pain that bothered him in the middle the night and following meals for several weeks.  He was evaluated by his PCP, Orland Mustard, MD on 8/8 who felt his symptoms were consistent with GERD and increased his Protonix from daily to twice daily. He was evaluated by his cardiologist, Marijo File, MD, on 8/21 who felt there were no active cardiac problems leading to his symptoms. On pantoprazole 40 mg twice daily his symptoms have completely resolved.  He notes multiple foods that exacerbate his reflux symptoms and he has been avoiding those foods.  He is maintained on Plavix.  Current Medications, Allergies, Past Medical History, Past Surgical History, Family History and Social History were reviewed in [employer] record.  Physical Exam: General: Well developed, well nourished, no acute distress Head: Normocephalic and atraumatic Eyes:  sclerae anicteric, EOMI Ears: Normal auditory acuity Mouth: No deformity or lesions Lungs: Clear throughout to auscultation Heart: Regular rate and rhythm; no murmurs, rubs or bruits Abdomen: Soft, non tender and non distended. No masses, hepatosplenomegaly or hernias noted. Normal Bowel sounds Rectal: Not done Musculoskeletal: Symmetrical with no gross deformities  Pulses:  Normal pulses noted Extremities: No clubbing, cyanosis, edema or deformities noted Neurological: Alert oriented x 4, grossly nonfocal Psychological:  Alert and cooperative. Normal mood and affect  Assessment and Recommendations:  1.  GERD.  Follow all standard antireflux measures long term.  Continue pantoprazole 40 mg twice daily long term.  I offered EGD for further evaluation however since his symptoms are currently under very good control we will defer EGD at this time.  If symptoms are not adequately controlled we will plan for an EGD.  He is comfortable  with this plan.  He is advised to call if he has any worsening or change in symptoms including epigastric pain, chest pain, dysphagia, nausea, vomiting, early satiety. REV in 1 year.   2. Personal history of adenomatous colon polyps. Surveillance colonoscopy 04/2020  3.  Status post CABG and cardiac stent placement.  Maintained on Plavix.

## 2018-01-31 NOTE — Patient Instructions (Signed)
Stay on Protonix twice daily.  Patient advised to avoid spicy, acidic, citrus, chocolate, mints, fruit and fruit juices.  Limit the intake of caffeine, alcohol and Soda.  Don't exercise too soon after eating.  Don't lie down within 3-4 hours of eating.  Elevate the head of your bed.  You will be due for a recall colonoscopy in 04/2020. We will send you a reminder in the mail when it gets closer to that time.  Thank you for choosing me and [facility].  Pricilla Riffle. Dagoberto Ligas., MD., Marval Regal

## 2018-02-10 DIAGNOSIS — H25813 Combined forms of age-related cataract, bilateral: Secondary | ICD-10-CM | POA: Diagnosis not present

## 2018-03-29 ENCOUNTER — Encounter: Payer: Self-pay | Admitting: Internal Medicine

## 2018-03-29 ENCOUNTER — Ambulatory Visit (INDEPENDENT_AMBULATORY_CARE_PROVIDER_SITE_OTHER): Payer: Medicare Other | Admitting: Internal Medicine

## 2018-03-29 VITALS — BP 138/72 | HR 61 | Temp 97.4°F | Ht 67.0 in | Wt 203.4 lb

## 2018-03-29 DIAGNOSIS — B009 Herpesviral infection, unspecified: Secondary | ICD-10-CM

## 2018-03-29 DIAGNOSIS — E119 Type 2 diabetes mellitus without complications: Secondary | ICD-10-CM

## 2018-03-29 DIAGNOSIS — Z23 Encounter for immunization: Secondary | ICD-10-CM

## 2018-03-29 DIAGNOSIS — E785 Hyperlipidemia, unspecified: Secondary | ICD-10-CM

## 2018-03-29 DIAGNOSIS — I25810 Atherosclerosis of coronary artery bypass graft(s) without angina pectoris: Secondary | ICD-10-CM

## 2018-03-29 DIAGNOSIS — I10 Essential (primary) hypertension: Secondary | ICD-10-CM

## 2018-03-29 DIAGNOSIS — Z1329 Encounter for screening for other suspected endocrine disorder: Secondary | ICD-10-CM | POA: Diagnosis not present

## 2018-03-29 DIAGNOSIS — R7303 Prediabetes: Secondary | ICD-10-CM | POA: Insufficient documentation

## 2018-03-29 DIAGNOSIS — M542 Cervicalgia: Secondary | ICD-10-CM | POA: Insufficient documentation

## 2018-03-29 DIAGNOSIS — I2510 Atherosclerotic heart disease of native coronary artery without angina pectoris: Secondary | ICD-10-CM

## 2018-03-29 DIAGNOSIS — K5732 Diverticulitis of large intestine without perforation or abscess without bleeding: Secondary | ICD-10-CM

## 2018-03-29 HISTORY — DX: Prediabetes: R73.03

## 2018-03-29 HISTORY — DX: Cervicalgia: M54.2

## 2018-03-29 MED ORDER — VALACYCLOVIR HCL 1 G PO TABS: 1000.0000 mg | ORAL_TABLET | Freq: Two times a day (BID) | ORAL | 11 refills | Status: DC

## 2018-03-29 MED ORDER — EZETIMIBE 10 MG PO TABS: 10.0000 mg | ORAL_TABLET | Freq: Every day | ORAL | 3 refills | Status: DC

## 2018-03-29 NOTE — Patient Instructions (Addendum)
Check zetia at home and make sure taking    Valacyclovir caplets What is this medicine? VALACYCLOVIR (val ay SYE kloe veer) is an antiviral medicine. It is used to treat or prevent infections caused by certain kinds of viruses. Examples of these infections include herpes and shingles. This medicine will not cure herpes. This medicine may be used for other purposes; ask your health care provider or pharmacist if you have questions. COMMON BRAND NAME(S): Valtrex What should I tell my health care provider before I take this medicine? They need to know if you have any of these conditions: -acquired immunodeficiency syndrome (AIDS) -any other condition that may weaken the immune system -bone marrow or kidney transplant -kidney disease -an unusual or allergic reaction to valacyclovir, acyclovir, ganciclovir, valganciclovir, other medicines, foods, dyes, or preservatives -pregnant or trying to get pregnant -breast-feeding How should I use this medicine? Take this medicine by mouth with a glass of water. Follow the directions on the prescription label. You can take this medicine with or without food. Take your doses at regular intervals. Do not take your medicine more often than directed. Finish the full course prescribed by your doctor or health care professional even if you think your condition is better. Do not stop taking except on the advice of your doctor or health care professional. Talk to your pediatrician regarding the use of this medicine in children. While this drug may be prescribed for children as young as 2 years for selected conditions, precautions do apply. Overdosage: If you think you have taken too much of this medicine contact a poison control center or emergency room at once. NOTE: This medicine is only for you. Do not share this medicine with others. What if I miss a dose? If you miss a dose, take it as soon as you can. If it is almost time for your next dose, take only that  dose. Do not take double or extra doses. What may interact with this medicine? -cimetidine -probenecid This list may not describe all possible interactions. Give your health care provider a list of all the medicines, herbs, non-prescription drugs, or dietary supplements you use. Also tell them if you smoke, drink alcohol, or use illegal drugs. Some items may interact with your medicine. What should I watch for while using this medicine? Tell your doctor or health care professional if your symptoms do not start to get better after 1 week. This medicine works best when taken early in the course of an infection, within the first 31 hours. Begin treatment as soon as possible after the first signs of infection like tingling, itching, or pain in the affected area. It is possible that genital herpes may still be spread even when you are not having symptoms. Always use safer sex practices like condoms made of latex or polyurethane whenever you have sexual contact. You should stay well hydrated while taking this medicine. Drink plenty of fluids. What side effects may I notice from receiving this medicine? Side effects that you should report to your doctor or health care professional as soon as possible: -allergic reactions like skin rash, itching or hives, swelling of the face, lips, or tongue -aggressive behavior -confusion -hallucinations -problems with balance, talking, walking -stomach pain -tremor -trouble passing urine or change in the amount of urine Side effects that usually do not require medical attention (report to your doctor or health care professional if they continue or are bothersome): -dizziness -headache -nausea, vomiting This list may not describe all possible side  effects. Call your doctor for medical advice about side effects. You may report side effects to FDA at 1-800-FDA-1088. Where should I keep my medicine? Keep out of the reach of children. Store at room temperature  between 15 and 25 degrees C (59 and 77 degrees F). Keep container tightly closed. Throw away any unused medicine after the expiration date. NOTE: This sheet is a summary. It may not cover all possible information. If you have questions about this medicine, talk to your doctor, pharmacist, or health care provider.  2018 Elsevier/Gold Standard (2012-04-19 16:34:05)

## 2018-03-29 NOTE — Progress Notes (Signed)
Chief Complaint  Patient presents with  . Follow-up   F/u  1. Reports diverticulitis 3 months ago and went to walk in clinic given cipro and flagyl  2. 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 H/o CAD, mild CAS b/l carotids 1-39%, s/p CABG w/o chest pain currently  4. DM2 A1C 6.8 last checked with repeat today and pt is fasting  5. Seen in ED 01/24/18 with neck pain went to ED given shot and muscle relaxers which helped Xray +arthritis changes and osteopenia neck pain resolved now   Review of Systems  Constitutional: Negative for weight loss.  HENT: Negative for hearing loss.   Eyes: Negative for blurred vision.  Respiratory: Negative for shortness of breath.   Cardiovascular: Negative for chest pain.  Gastrointestinal: Negative for abdominal pain.  Musculoskeletal: Negative for neck pain.  Skin: Negative for rash.  Neurological: Negative for headaches.  Psychiatric/Behavioral: Negative for depression.   Past Medical History:  Diagnosis Date  . Acute cholecystitis 04/18/2013  . Adenomatous colon polyp 12/2004  . Anemia   . BPH associated with nocturia   . CAD (coronary artery disease)    a. s/p CABG x4 in 2000 with LIMA-LAD, reverse SVG-IM, reverse SVG-acute margin and reverse SVG-RCA b. s/p BMS to SVG-RI in 2012 c. 12/2016: PCI/DES to SVG-RCA and PCI/DES to SVG-RI (Dr. [name])   . Diabetes mellitus without complication ([place]) 34/19/3790   recent A1C 6.1 12/25/16 controlled with diet and exercise   . Diverticulitis 2008/2009  . GERD (gastroesophageal reflux disease)    controlled as of 04/01/17   . HTN (hypertension)   . Hyperglycemia   . Hyperlipidemia   . Impingement syndrome of left shoulder 2016  . Low testosterone    Dr [name], [place] ,[place]  . Osteoarthritis of right knee    With trace effusion  . Peyronie disease   . RLS (restless legs syndrome) 08/15/2013  . Sinus bradycardia   . Thrombocytopenia ([place])   . Vitamin D deficiency    Past Surgical  History:  Procedure Laterality Date  . CHOLECYSTECTOMY N/A 04/21/2013   Procedure: LAPAROSCOPIC CHOLECYSTECTOMY WITH INTRAOPERATIVE CHOLANGIOGRAM;  Surgeon: Haywood Lasso, MD;  Location: [facility];  Service: General;  Laterality: N/A;  . COLONOSCOPY W/ POLYPECTOMY  2004 & 2009    X 2; Dr Fuller Plan; due 2014  . CORONARY ANGIOPLASTY    . CORONARY ARTERY BYPASS GRAFT  04/1999   4 vessels  . CORONARY STENT INTERVENTION N/A 12/28/2016   Procedure: CORONARY STENT INTERVENTION;  Surgeon: Lorretta Harp, MD;  Location: [facility];  Service: Cardiovascular;  Laterality: N/A;  . CORONARY STENT PLACEMENT  2012   Plavix  . LAPAROSCOPIC CHOLECYSTECTOMY  04/21/2013   Dr Margot Chimes; acutecholecystitis with necrosis  . LEFT HEART CATH AND CORS/GRAFTS ANGIOGRAPHY N/A 12/28/2016   Procedure: LEFT HEART CATH AND CORS/GRAFTS ANGIOGRAPHY;  Surgeon: Lorretta Harp, MD;  Location: [facility];  Service: Cardiovascular;  Laterality: N/A;  . VASECTOMY     Family History  Problem Relation Age of Onset  . Aortic aneurysm Father 57       ? abdominal  . Heart attack Father 25  . Diabetes Unknown        PGaunt  . Coronary artery disease Mother        CABG in 69s  . CVA Mother        cns bleed from warfarin  . Aneurysm Mother   . Diabetes Brother   .  Cholecystitis Sister   . Cancer Neg Hx   . Colon cancer Neg Hx   . Prostate cancer Neg Hx    Social History   Socioeconomic History  . Marital status: Married    Spouse name: Not on file  . Number of children: 1  . Years of education: Not on file  . Highest education level: Not on file  Occupational History  . Occupation:      Comment: plumber  Social Needs  . Financial resource strain: Not hard at all  . Food insecurity:    Worry: Never true    Inability: Never true  . Transportation needs:    Medical: No    Non-medical: No  Tobacco Use  . Smoking status: Never Smoker  . Smokeless tobacco: Never Used  Substance and Sexual Activity   . Alcohol use: Yes    Alcohol/week: 10.0 - 12.0 standard drinks    Types: 10 - 12 Cans of beer per week    Comment: socially   . Drug use: No  . Sexual activity: Not on file  Lifestyle  . Physical activity:    Days per week: Not on file    Minutes per session: Not on file  . Stress: Not at all  Relationships  . Social connections:    Talks on phone: Not on file    Gets together: Not on file    Attends religious service: Not on file    Active member of club or organization: Not on file    Attends meetings of clubs or organizations: Not on file    Relationship status: Not on file  . Intimate partner violence:    Fear of current or ex partner: No    Emotionally abused: No    Physically abused: No    Forced sexual activity: No  Other Topics Concern  . Not on file  Social History Narrative   Remarried 2014   1 son age 79 as of 03/2017    Current Meds  Medication Sig  . amLODipine (NORVASC) 5 MG tablet TAKE ONE (1) TABLET EACH DAY  . Ascorbic Acid (VITAMIN C) 1000 MG tablet Take 1,000 mg by mouth daily. Reported on 09/26/2015  . aspirin 81 MG tablet Take 81 mg by mouth daily.  Marland Kitchen Bioflavonoid Products (GRAPE SEED PO) Take 1 capsule by mouth daily. Reported on 09/26/2015  . Cholecalciferol (VITAMIN D3) 5000 UNITS CAPS Take by mouth daily.  . ciprofloxacin (CIPRO) 500 MG tablet Take 1 tablet (500 mg total) by mouth 2 (two) times daily. With food  . clopidogrel (PLAVIX) 75 MG tablet Take 1 tablet (75 mg total) by mouth daily.  Marland Kitchen co-enzyme Q-10 30 MG capsule Take 30 mg by mouth daily.   . cyclobenzaprine (FLEXERIL) 5 MG tablet Take 1 tablet (5 mg total) by mouth 3 (three) times daily as needed for muscle spasms. (Patient taking differently: Take 5 mg by mouth as needed for muscle spasms. )  . ezetimibe (ZETIA) 10 MG tablet Take 1 tablet (10 mg total) by mouth daily.  Marland Kitchen gabapentin (NEURONTIN) 100 MG capsule Take 100 mg by mouth 2 (two) times daily as needed.  Marland Kitchen GINKGO BILOBA PO Take 1  capsule by mouth daily.  . metoprolol succinate (TOPROL XL) 25 MG 24 hr tablet Take 1 tablet (25 mg total) by mouth daily.  . milk thistle 175 MG tablet Take 175 mg by mouth daily.  . Multiple Vitamin (MULTIVITAMIN) tablet Take 1 tablet by mouth daily. Reported  on 09/26/2015  . nitroGLYCERIN (NITROSTAT) 0.4 MG SL tablet Place 1 tablet (0.4 mg total) under the tongue every 5 (five) minutes x 3 doses as needed.  . Omega-3 Fatty Acids (FISH OIL) 1000 MG CAPS 1 tab po qd   . pantoprazole (PROTONIX) 40 MG tablet Take 1 tablet (40 mg total) by mouth 2 (two) times daily before a meal. 30 minutes  . ramipril (ALTACE) 10 MG capsule Take 1 capsule (10 mg total) by mouth daily.  . rosuvastatin (CRESTOR) 40 MG tablet Take 1 tablet (40 mg total) by mouth at bedtime.  . Saw Palmetto, Serenoa repens, (SAW PALMETTO PO) Take 1 capsule by mouth daily.  . SELENIUM PO Take 1 tablet by mouth daily. Reported on 09/26/2015  . vitamin E 100 UNIT capsule Take 100 Units by mouth daily.     Allergies  Allergen Reactions  . Darvocet [Propoxyphene N-Acetaminophen]   . Propoxyphene Hives   No results found for this or any previous visit (from the past 2160 hour(s)). Objective  Body mass index is 31.86 kg/m. Wt Readings from Last 3 Encounters:  03/29/18 203 lb 6.4 oz (92.3 kg)  01/31/18 203 lb 8 oz (92.3 kg)  01/24/18 193 lb (87.5 kg)   Temp Readings from Last 3 Encounters:  03/29/18 (!) 97.4 F (36.3 C) (Oral)  01/24/18 98.5 F (36.9 C)  12/23/17 99.1 F (37.3 C) (Oral)   BP Readings from Last 3 Encounters:  03/29/18 138/72  01/31/18 134/80  01/24/18 119/63   Pulse Readings from Last 3 Encounters:  03/29/18 61  01/31/18 64  01/24/18 64    Physical Exam  Constitutional: He is oriented to person, place, and time. Vital signs are normal. He appears well-developed and well-nourished. He is cooperative.  HENT:  Head: Normocephalic and atraumatic.  Mouth/Throat: Oropharynx is clear and moist and mucous  membranes are normal.  Eyes: Pupils are equal, round, and reactive to light. Conjunctivae are normal.  Cardiovascular: Normal rate, regular rhythm and normal heart sounds.  Pulmonary/Chest: Effort normal and breath sounds normal.  Neurological: He is alert and oriented to person, place, and time. Gait normal.  Skin: Skin is warm, dry and intact.  Psychiatric: He has a normal mood and affect. His speech is normal and behavior is normal. Judgment and thought content normal. Cognition and memory are normal.  Vitals reviewed.   Assessment   1. HTN, h/o CAD s/p CABG, CAS mild, HLD 2. Diverticulitis  3. HSV 1  4. DM2 A1C 6.8 cbgs in the am 84-116 5. H/o arthritis cervical  6. HM Plan   1. Cont meds  2. Controlled  3. Valtrex 1 g bid prn x 7 days prn outbreak  4. Check labs today lipid, A1C, tSH, urine protein  Get records Dr. Gloriann Loan  5. Monitor had Xray neck 01/24/18  Consider PT if flares in future  6.  flu shot today  - utd zostavax, prevnar, pna 23, Tdap ? If had 08/07/08  Consider Tdap in future  Consider shingrix and hep B vaccine not immune MMR immune PSA had 03/2017 f/u with Dr. Rogers Blocker will check at f/u   Had eye exam Dr. Gloriann Loan ~11/2017 get records  Never smoker no CT chest  [place] 04/2013 abdominal aorta normal   Colonoscopy had 05/02/15 Dr. Fuller Plan sessile polyp repeat 04/2020 per report . Saw Dr. Silvio Pate 01/2018 Refer derm annual skin checksaw derm 05/03/17 noted SK, lentigos, rosacea, Aks face and arms f/u in 1 year [facility]. [place].  Seen left neck pain 01/25/18 [facility] they think consider PT referral with PCP and could be muscle spasm seen by [facility]    Provider: Dr. Olivia Mackie McLean-Scocuzza-[facility]

## 2018-03-29 NOTE — Progress Notes (Signed)
Pre visit review using our clinic review tool, if applicable. No additional management support is needed unless otherwise documented below in the visit note. 

## 2018-03-30 LAB — TSH: TSH: 1.75 u[IU]/mL (ref 0.35–4.50)

## 2018-03-30 LAB — BASIC METABOLIC PANEL
BUN: 10 mg/dL (ref 6–23)
CO2: 28 mEq/L (ref 19–32)
Calcium: 9.9 mg/dL (ref 8.4–10.5)
Chloride: 101 mEq/L (ref 96–112)
Creatinine, Ser: 0.91 mg/dL (ref 0.40–1.50)
GFR: 87.47 mL/min (ref >60.00)
Glucose, Bld: 97 mg/dL (ref 70–99)
Potassium: 4.7 mEq/L (ref 3.5–5.1)
SODIUM: 139 meq/L (ref 135–145)

## 2018-03-30 LAB — HEMOGLOBIN A1C: Hgb A1c MFr Bld: 6.4 % (ref 4.6–6.5)

## 2018-03-30 LAB — LIPID PANEL
Cholesterol: 137 mg/dL (ref 0–200)
HDL: 52.3 mg/dL (ref >39.00)
LDL Cholesterol: 63 mg/dL (ref 0–99)
NonHDL: 84.47
TRIGLYCERIDES: 108 mg/dL (ref 0.0–149.0)
Total CHOL/HDL Ratio: 3
VLDL: 21.6 mg/dL (ref 0.0–40.0)

## 2018-03-30 LAB — MICROALBUMIN / CREATININE URINE RATIO
CREATININE, URINE: 53 mg/dL (ref 20–320)
MICROALB UR: 0.5 mg/dL
MICROALB/CREAT RATIO: 9 ug/mg{creat} (ref <30)

## 2018-04-12 ENCOUNTER — Other Ambulatory Visit: Payer: Self-pay | Admitting: Internal Medicine

## 2018-04-12 DIAGNOSIS — K219 Gastro-esophageal reflux disease without esophagitis: Secondary | ICD-10-CM

## 2018-04-12 DIAGNOSIS — I10 Essential (primary) hypertension: Secondary | ICD-10-CM

## 2018-04-12 MED ORDER — AMLODIPINE BESYLATE 5 MG PO TABS: ORAL_TABLET | ORAL | 3 refills | Status: DC

## 2018-04-12 MED ORDER — PANTOPRAZOLE SODIUM 40 MG PO TBEC: 40.0000 mg | DELAYED_RELEASE_TABLET | Freq: Two times a day (BID) | ORAL | 0 refills | Status: DC

## 2018-04-12 MED ORDER — RAMIPRIL 10 MG PO CAPS: 10.0000 mg | ORAL_CAPSULE | Freq: Every day | ORAL | 3 refills | Status: DC

## 2018-05-03 ENCOUNTER — Other Ambulatory Visit: Payer: Self-pay | Admitting: Internal Medicine

## 2018-05-03 DIAGNOSIS — I2510 Atherosclerotic heart disease of native coronary artery without angina pectoris: Secondary | ICD-10-CM

## 2018-05-03 DIAGNOSIS — E119 Type 2 diabetes mellitus without complications: Secondary | ICD-10-CM

## 2018-05-03 DIAGNOSIS — E785 Hyperlipidemia, unspecified: Secondary | ICD-10-CM

## 2018-05-03 MED ORDER — NITROGLYCERIN 0.4 MG SL SUBL: 0.4000 mg | SUBLINGUAL_TABLET | SUBLINGUAL | 11 refills | Status: DC | PRN

## 2018-05-03 MED ORDER — EZETIMIBE 10 MG PO TABS: 10.0000 mg | ORAL_TABLET | Freq: Every day | ORAL | 3 refills | Status: DC

## 2018-05-04 DIAGNOSIS — I788 Other diseases of capillaries: Secondary | ICD-10-CM | POA: Diagnosis not present

## 2018-05-04 DIAGNOSIS — L821 Other seborrheic keratosis: Secondary | ICD-10-CM | POA: Diagnosis not present

## 2018-05-04 DIAGNOSIS — D485 Neoplasm of uncertain behavior of skin: Secondary | ICD-10-CM | POA: Diagnosis not present

## 2018-05-04 DIAGNOSIS — D1800 Hemangioma unspecified site: Secondary | ICD-10-CM | POA: Diagnosis not present

## 2018-05-04 DIAGNOSIS — L400 Psoriasis vulgaris: Secondary | ICD-10-CM | POA: Diagnosis not present

## 2018-05-04 DIAGNOSIS — L719 Rosacea, unspecified: Secondary | ICD-10-CM | POA: Diagnosis not present

## 2018-05-04 DIAGNOSIS — Z86018 Personal history of other benign neoplasm: Secondary | ICD-10-CM

## 2018-05-04 DIAGNOSIS — L812 Freckles: Secondary | ICD-10-CM | POA: Diagnosis not present

## 2018-05-04 DIAGNOSIS — D225 Melanocytic nevi of trunk: Secondary | ICD-10-CM | POA: Diagnosis not present

## 2018-05-04 DIAGNOSIS — D229 Melanocytic nevi, unspecified: Secondary | ICD-10-CM | POA: Diagnosis not present

## 2018-05-04 DIAGNOSIS — Z1283 Encounter for screening for malignant neoplasm of skin: Secondary | ICD-10-CM | POA: Diagnosis not present

## 2018-05-04 HISTORY — DX: Personal history of other benign neoplasm: Z86.018

## 2018-05-10 ENCOUNTER — Other Ambulatory Visit: Payer: Self-pay | Admitting: Internal Medicine

## 2018-05-10 DIAGNOSIS — E785 Hyperlipidemia, unspecified: Secondary | ICD-10-CM

## 2018-05-10 DIAGNOSIS — I2510 Atherosclerotic heart disease of native coronary artery without angina pectoris: Secondary | ICD-10-CM

## 2018-05-10 DIAGNOSIS — E119 Type 2 diabetes mellitus without complications: Secondary | ICD-10-CM

## 2018-05-10 MED ORDER — NITROGLYCERIN 0.4 MG SL SUBL: 0.4000 mg | SUBLINGUAL_TABLET | SUBLINGUAL | 11 refills | Status: DC | PRN

## 2018-05-10 MED ORDER — EZETIMIBE 10 MG PO TABS: 10.0000 mg | ORAL_TABLET | Freq: Every day | ORAL | 3 refills | Status: DC

## 2018-05-10 MED ORDER — ROSUVASTATIN CALCIUM 40 MG PO TABS: 40.0000 mg | ORAL_TABLET | Freq: Every day | ORAL | 1 refills | Status: DC

## 2018-05-10 MED ORDER — ROSUVASTATIN CALCIUM 40 MG PO TABS: 40.0000 mg | ORAL_TABLET | Freq: Every day | ORAL | 3 refills | Status: DC

## 2018-05-10 NOTE — Telephone Encounter (Signed)
Copied from [place] [id number]. Topic: Quick Communication - Rx Refill/Question >> May 10, 2018  9:55 AM Sheppard Coil, [employer] L wrote: Medication:  rosuvastatin (CRESTOR) 40 MG tablet ezetimibe (ZETIA) 10 MG tablet nitroGLYCERIN (NITROSTAT) 0.4 MG SL tablet   Has the patient contacted their pharmacy? Yes - told to contact PCP since he is completely out of nitroglycerin to see if that could be called in immediately in case it is needed. (Agent: If no, request that the patient contact the pharmacy for the refill.) (Agent: If yes, when and what did the pharmacy advise?)  Preferred Pharmacy (with phone number or street name): [place], [place] - [place] [phone] (Phone) [phone] (Fax)  Agent: Please be advised that RX refills may take up to 3 business days. We ask that you follow-up with your pharmacy.

## 2018-05-10 NOTE — Telephone Encounter (Signed)
Phone call to pt's sister (on [place]).  Noted that the Zetia and Ntg. were ordered at [facility] 05/05/18.  Sister reported that [facility] went out of business and their records were transferred to [place], however the pt. does not want to deal with [facility].  Sister is requesting that Ntg., Crestor, and Zetia be sent to [facility] In [place].

## 2018-05-10 NOTE — Telephone Encounter (Signed)
Changing pharmacy- Rx forwarded to new pharmacy. Requested Prescriptions  Pending Prescriptions Disp Refills  . nitroGLYCERIN (NITROSTAT) 0.4 MG SL tablet 25 tablet 11    Sig: Place 1 tablet (0.4 mg total) under the tongue every 5 (five) minutes x 3 doses as needed.     Cardiovascular:  Nitrates Passed - 05/10/2018 10:27 AM      Passed - Last BP in normal range    BP Readings from Last 1 Encounters:  03/29/18 138/72         Passed - Last Heart Rate in normal range    Pulse Readings from Last 1 Encounters:  03/29/18 61         Passed - Valid encounter within last 12 months    Recent Outpatient Visits          1 month ago Type 2 diabetes mellitus without complication, without long-term current use of insulin ([place])   [place] McLean-Scocuzza, Nino Glow, MD   4 months ago Gastroesophageal reflux disease, esophagitis presence not specified   [place] McLean-Scocuzza, Nino Glow, MD   6 months ago Fever, unspecified fever cause   [place], Yvetta Coder, FNP   1 year ago Essential hypertension   [place], Nino Glow, MD   1 year ago Essential hypertension   [facility] [place], MD      Future Appointments            In 4 months McLean-Scocuzza, Nino Glow, MD [place], [place]   In 6 months O'Brien-Blaney, Denisa L, LPN [place], [place]   In 6 months McLean-Scocuzza, Nino Glow, MD [place], [facility]         . ezetimibe (ZETIA) 10 MG tablet 90 tablet 3    Sig: Take 1 tablet (10 mg total) by mouth daily.     Cardiovascular:  Antilipid - Sterol Transport Inhibitors Passed - 05/10/2018 10:27 AM      Passed - Total Cholesterol in normal range and within 360 days    Cholesterol  Date Value Ref Range Status  03/29/2018 137 0 - 200 mg/dL Final    Comment:    ATP III Classification        Desirable:  < 200 mg/dL               Borderline High:  200 - 239 mg/dL          High:  > = 240 mg/dL         Passed - LDL in normal range and within 360 days    LDL Cholesterol  Date Value Ref Range Status  03/29/2018 63 0 - 99 mg/dL Final         Passed - HDL in normal range and within 360 days    HDL  Date Value Ref Range Status  03/29/2018 52.30 >39.00 mg/dL Final         Passed - Triglycerides in normal range and within 360 days    Triglycerides  Date Value Ref Range Status  03/29/2018 108.0 0.0 - 149.0 mg/dL Final    Comment:    Normal:  <150 mg/dLBorderline High:  150 - 199 mg/dL         Passed - Valid encounter within last 12 months    Recent Outpatient Visits          1 month  ago Type 2 diabetes mellitus without complication, without long-term current use of insulin ([place])   [place] McLean-Scocuzza, Nino Glow, MD   4 months ago Gastroesophageal reflux disease, esophagitis presence not specified   [place] McLean-Scocuzza, Nino Glow, MD   6 months ago Fever, unspecified fever cause   [facility] [place], Yvetta Coder, [place]   1 year ago Essential hypertension   [facility] [place] McLean-Scocuzza, Nino Glow, MD   1 year ago Essential hypertension   [facility] [place], MD      Future Appointments            In 4 months McLean-Scocuzza, Nino Glow, MD [place], [place]   In 6 months O'Brien-Blaney, Bryson Corona, LPN [place], [place]   In 6 months McLean-Scocuzza, Nino Glow, MD [facility], [place]         . rosuvastatin (CRESTOR) 40 MG tablet 90 tablet 1    Sig: Take 1 tablet (40 mg total) by mouth at bedtime.     Cardiovascular:  Antilipid - Statins Passed - 05/10/2018 10:27 AM      Passed - Total Cholesterol in normal range and within 360 days    Cholesterol  Date Value Ref Range Status   03/29/2018 137 0 - 200 mg/dL Final    Comment:    ATP III Classification       Desirable:  < 200 mg/dL               Borderline High:  200 - 239 mg/dL          High:  > = 240 mg/dL         Passed - LDL in normal range and within 360 days    LDL Cholesterol  Date Value Ref Range Status  03/29/2018 63 0 - 99 mg/dL Final         Passed - HDL in normal range and within 360 days    HDL  Date Value Ref Range Status  03/29/2018 52.30 >39.00 mg/dL Final         Passed - Triglycerides in normal range and within 360 days    Triglycerides  Date Value Ref Range Status  03/29/2018 108.0 0.0 - 149.0 mg/dL Final    Comment:    Normal:  <150 mg/dLBorderline High:  150 - 199 mg/dL         Passed - Patient is not pregnant      Passed - Valid encounter within last 12 months    Recent Outpatient Visits          1 month ago Type 2 diabetes mellitus without complication, without long-term current use of insulin ([place])   [place] McLean-Scocuzza, Nino Glow, MD   4 months ago Gastroesophageal reflux disease, esophagitis presence not specified   [place] McLean-Scocuzza, Nino Glow, MD   6 months ago Fever, unspecified fever cause   [place], Yvetta Coder, FNP   1 year ago Essential hypertension   [facility] [place] McLean-Scocuzza, Nino Glow, MD   1 year ago Essential hypertension   [facility] at [facility], Alda Berthold, MD      Future Appointments            In 4 months McLean-Scocuzza, Nino Glow, MD [facility], [facility]  In 6 months O'Brien-Blaney, Denisa L, LPN [place], [place]   In 6 months McLean-Scocuzza, Nino Glow, MD [place], [place]

## 2018-06-02 ENCOUNTER — Other Ambulatory Visit: Payer: Self-pay | Admitting: *Deleted

## 2018-06-02 MED ORDER — ISOSORBIDE MONONITRATE ER 120 MG PO TB24: 120.0000 mg | ORAL_TABLET | Freq: Every day | ORAL | 3 refills | Status: DC

## 2018-06-02 MED ORDER — CLOPIDOGREL BISULFATE 75 MG PO TABS: 75.0000 mg | ORAL_TABLET | Freq: Every day | ORAL | 3 refills | Status: DC

## 2018-06-07 ENCOUNTER — Other Ambulatory Visit: Payer: Self-pay | Admitting: Internal Medicine

## 2018-06-07 DIAGNOSIS — K219 Gastro-esophageal reflux disease without esophagitis: Secondary | ICD-10-CM

## 2018-06-07 NOTE — Telephone Encounter (Signed)
Copied from [place] [id number]. Topic: Quick Communication - Rx Refill/Question >> Jun 07, 2018  4:57 PM Percell Belt A wrote: Medication: pantoprazole (PROTONIX) 40 MG tablet [[id number]]  metoprolol succinate (TOPROL XL) 25 MG 24 hr tablet [[id number]] Has the patient contacted their pharmacy? Pt has mover pharmacy so they need a new script sent  (Agent: If no, request that the patient contact the pharmacy for the refill.) (Agent: If yes, when and what did the pharmacy advise?)  Preferred Pharmacy (with phone number or street name): [place], [place] - [place] [phone] (Phone)   Agent: Please be advised that RX refills may take up to 3 business days. We ask that you follow-up with your pharmacy.

## 2018-06-07 NOTE — Telephone Encounter (Signed)
Need new prescriptions for Protonix 40 and metoprolol succinate 25 mg. The Protonix is a tapered medication. The metoprolol succinate expires 07/09/2018. Dr. Terese Door.  Please review.

## 2018-06-08 MED ORDER — METOPROLOL SUCCINATE ER 25 MG PO TB24: 25.0000 mg | ORAL_TABLET | Freq: Every day | ORAL | 2 refills | Status: DC

## 2018-06-08 MED ORDER — PANTOPRAZOLE SODIUM 40 MG PO TBEC: 40.0000 mg | DELAYED_RELEASE_TABLET | Freq: Two times a day (BID) | ORAL | 0 refills | Status: DC

## 2018-06-09 ENCOUNTER — Other Ambulatory Visit: Payer: Self-pay | Admitting: Internal Medicine

## 2018-06-09 DIAGNOSIS — K219 Gastro-esophageal reflux disease without esophagitis: Secondary | ICD-10-CM

## 2018-07-05 DIAGNOSIS — N486 Induration penis plastica: Secondary | ICD-10-CM | POA: Diagnosis not present

## 2018-07-05 DIAGNOSIS — Z79899 Other long term (current) drug therapy: Secondary | ICD-10-CM | POA: Diagnosis not present

## 2018-07-05 DIAGNOSIS — E291 Testicular hypofunction: Secondary | ICD-10-CM | POA: Diagnosis not present

## 2018-07-05 DIAGNOSIS — N401 Enlarged prostate with lower urinary tract symptoms: Secondary | ICD-10-CM | POA: Diagnosis not present

## 2018-07-05 DIAGNOSIS — N5201 Erectile dysfunction due to arterial insufficiency: Secondary | ICD-10-CM | POA: Diagnosis not present

## 2018-07-14 ENCOUNTER — Telehealth: Payer: Self-pay | Admitting: Internal Medicine

## 2018-07-14 NOTE — Telephone Encounter (Signed)
Copied from [place] [id number]. Topic: Quick Communication - See Telephone Encounter >> Jul 14, 2018  3:14 PM Rutherford Nail, [place] wrote: CRM for notification. See Telephone encounter for: 07/14/18. Patient calling and states that the pantoprazole (PROTONIX) 40 MG tablet is not working. Would like to know if the strength could be altered or a new medication prescribed? States it has been going on for about a week. Please advise.

## 2018-07-15 DIAGNOSIS — M7542 Impingement syndrome of left shoulder: Secondary | ICD-10-CM | POA: Diagnosis not present

## 2018-07-15 DIAGNOSIS — M7552 Bursitis of left shoulder: Secondary | ICD-10-CM | POA: Diagnosis not present

## 2018-07-15 NOTE — Telephone Encounter (Signed)
Second call patient requesting change in protonix

## 2018-07-15 NOTE — Telephone Encounter (Signed)
Patient is calling to check on the status. Please advise. Thank you

## 2018-07-17 ENCOUNTER — Other Ambulatory Visit: Payer: Self-pay | Admitting: Internal Medicine

## 2018-07-17 DIAGNOSIS — K219 Gastro-esophageal reflux disease without esophagitis: Secondary | ICD-10-CM

## 2018-07-17 MED ORDER — DEXLANSOPRAZOLE 60 MG PO CPDR: 60.0000 mg | DELAYED_RELEASE_CAPSULE | Freq: Every day | ORAL | 0 refills | Status: DC

## 2018-07-17 NOTE — Telephone Encounter (Signed)
Sent Dexilant  I think he should f/u with GI for upper endoscopy, is he agreeable?   [place]

## 2018-07-18 ENCOUNTER — Other Ambulatory Visit: Payer: Self-pay | Admitting: Internal Medicine

## 2018-07-18 DIAGNOSIS — K219 Gastro-esophageal reflux disease without esophagitis: Secondary | ICD-10-CM

## 2018-07-18 NOTE — Telephone Encounter (Signed)
Patient was informed .  Patient understood and no questions, comments, or concerns at this time. He is ok with GI DR Fuller Plan.

## 2018-08-03 ENCOUNTER — Encounter: Payer: Self-pay | Admitting: Internal Medicine

## 2018-08-03 ENCOUNTER — Other Ambulatory Visit: Payer: Self-pay

## 2018-08-03 ENCOUNTER — Ambulatory Visit (INDEPENDENT_AMBULATORY_CARE_PROVIDER_SITE_OTHER): Payer: Medicare Other | Admitting: Internal Medicine

## 2018-08-03 VITALS — BP 136/70 | HR 78 | Temp 98.3°F | Ht 67.0 in | Wt 210.8 lb

## 2018-08-03 DIAGNOSIS — K219 Gastro-esophageal reflux disease without esophagitis: Secondary | ICD-10-CM

## 2018-08-03 DIAGNOSIS — J029 Acute pharyngitis, unspecified: Secondary | ICD-10-CM

## 2018-08-03 DIAGNOSIS — H209 Unspecified iridocyclitis: Secondary | ICD-10-CM

## 2018-08-03 LAB — POCT RAPID STREP A (OFFICE): RAPID STREP A SCREEN: NEGATIVE

## 2018-08-03 MED ORDER — AMOXICILLIN-POT CLAVULANATE 875-125 MG PO TABS: 1.0000 | ORAL_TABLET | Freq: Two times a day (BID) | ORAL | 0 refills | Status: DC

## 2018-08-03 NOTE — Progress Notes (Signed)
Pre visit review using our clinic review tool, if applicable. No additional management support is needed unless otherwise documented below in the visit note. 

## 2018-08-03 NOTE — Progress Notes (Signed)
Chief Complaint  Patient presents with  . Sore Throat   F/u  1. Scratchy red throat noted Monday no body aches or fever son had flu last week was not around and co workers with strep recently. Nothing tried he does not feel bad 2. GERD not helped by protonix acipex, dexilant 60, aciphex or zantac, dexilant is giving diarrhea. He noted this with recent tomotoe juice and alcohol tightness in throat flared. appt with GI pending     Review of Systems  Constitutional: Negative for weight loss.  HENT: Positive for sore throat.   Eyes: Negative for blurred vision.  Respiratory: Negative for shortness of breath.   Cardiovascular: Negative for chest pain.  Gastrointestinal: Negative for abdominal pain.  Musculoskeletal: Negative for falls.  Skin: Negative for rash.  Neurological: Negative for headaches.  Psychiatric/Behavioral: Negative for depression.   Past Medical History:  Diagnosis Date  . Acute cholecystitis 04/18/2013  . Adenomatous colon polyp 12/2004  . Anemia   . BPH associated with nocturia   . CAD (coronary artery disease)    a. s/p CABG x4 in 2000 with LIMA-LAD, reverse SVG-IM, reverse SVG-acute margin and reverse SVG-RCA b. s/p BMS to SVG-RI in 2012 c. 12/2016: PCI/DES to SVG-RCA and PCI/DES to SVG-RI (Dr. [name])   . Carotid artery stenosis    mild   . Diabetes mellitus without complication ([place]) 16/02/9603   recent A1C 6.1 12/25/16 controlled with diet and exercise   . Diverticulitis 2008/2009  . GERD (gastroesophageal reflux disease)    controlled as of 04/01/17   . HTN (hypertension)   . Hyperglycemia   . Hyperlipidemia   . Impingement syndrome of left shoulder 2016  . Low testosterone    Dr [name], [place] ,[place]  . Osteoarthritis of right knee    With trace effusion  . Peyronie disease   . RLS (restless legs syndrome) 08/15/2013  . Sinus bradycardia   . Thrombocytopenia ([place])   . Vitamin D deficiency    Past Surgical History:  Procedure Laterality Date  .  CHOLECYSTECTOMY N/A 04/21/2013   Procedure: LAPAROSCOPIC CHOLECYSTECTOMY WITH INTRAOPERATIVE CHOLANGIOGRAM;  Surgeon: Haywood Lasso, MD;  Location: [facility];  Service: General;  Laterality: N/A;  . COLONOSCOPY W/ POLYPECTOMY  2004 & 2009    X 2; Dr Fuller Plan; due 2014  . CORONARY ANGIOPLASTY    . CORONARY ARTERY BYPASS GRAFT  04/1999   4 vessels  . CORONARY STENT INTERVENTION N/A 12/28/2016   Procedure: CORONARY STENT INTERVENTION;  Surgeon: Lorretta Harp, MD;  Location: [facility];  Service: Cardiovascular;  Laterality: N/A;  . CORONARY STENT PLACEMENT  2012   Plavix  . LAPAROSCOPIC CHOLECYSTECTOMY  04/21/2013   Dr Margot Chimes; acutecholecystitis with necrosis  . LEFT HEART CATH AND CORS/GRAFTS ANGIOGRAPHY N/A 12/28/2016   Procedure: LEFT HEART CATH AND CORS/GRAFTS ANGIOGRAPHY;  Surgeon: Lorretta Harp, MD;  Location: [facility];  Service: Cardiovascular;  Laterality: N/A;  . VASECTOMY     Family History  Problem Relation Age of Onset  . Aortic aneurysm Father 38       ? abdominal  . Heart attack Father 21  . Diabetes Unknown        PGaunt  . Coronary artery disease Mother        CABG in 64s  . CVA Mother        cns bleed from warfarin  . Aneurysm Mother   . Diabetes Brother   . Cholecystitis Sister   .  Cancer Neg Hx   . Colon cancer Neg Hx   . Prostate cancer Neg Hx    Social History   Socioeconomic History  . Marital status: Married    Spouse name: Not on file  . Number of children: 1  . Years of education: Not on file  . Highest education level: Not on file  Occupational History  . Occupation:      Comment: plumber  Social Needs  . Financial resource strain: Not hard at all  . Food insecurity:    Worry: Never true    Inability: Never true  . Transportation needs:    Medical: No    Non-medical: No  Tobacco Use  . Smoking status: Never Smoker  . Smokeless tobacco: Never Used  Substance and Sexual Activity  . Alcohol use: Yes    Alcohol/week:  10.0 - 12.0 standard drinks    Types: 10 - 12 Cans of beer per week    Comment: socially   . Drug use: No  . Sexual activity: Not on file  Lifestyle  . Physical activity:    Days per week: Not on file    Minutes per session: Not on file  . Stress: Not at all  Relationships  . Social connections:    Talks on phone: Not on file    Gets together: Not on file    Attends religious service: Not on file    Active member of club or organization: Not on file    Attends meetings of clubs or organizations: Not on file    Relationship status: Not on file  . Intimate partner violence:    Fear of current or ex partner: No    Emotionally abused: No    Physically abused: No    Forced sexual activity: No  Other Topics Concern  . Not on file  Social History Narrative   Remarried 2014   1 son age 12 as of 03/2017    Current Meds  Medication Sig  . amLODipine (NORVASC) 5 MG tablet TAKE ONE (1) TABLET EACH DAY  . Ascorbic Acid (VITAMIN C) 1000 MG tablet Take 1,000 mg by mouth daily. Reported on 09/26/2015  . aspirin 81 MG tablet Take 81 mg by mouth daily.  Marland Kitchen Bioflavonoid Products (GRAPE SEED PO) Take 1 capsule by mouth daily. Reported on 09/26/2015  . Cholecalciferol (VITAMIN D3) 5000 UNITS CAPS Take by mouth daily.  . ciprofloxacin (CIPRO) 500 MG tablet Take 1 tablet (500 mg total) by mouth 2 (two) times daily. With food  . clopidogrel (PLAVIX) 75 MG tablet Take 1 tablet (75 mg total) by mouth daily.  Marland Kitchen co-enzyme Q-10 30 MG capsule Take 30 mg by mouth daily.   . cyclobenzaprine (FLEXERIL) 5 MG tablet Take 1 tablet (5 mg total) by mouth 3 (three) times daily as needed for muscle spasms. (Patient taking differently: Take 5 mg by mouth as needed for muscle spasms. )  . dexlansoprazole (DEXILANT) 60 MG capsule Take 1 capsule (60 mg total) by mouth daily.  Marland Kitchen ezetimibe (ZETIA) 10 MG tablet Take 1 tablet (10 mg total) by mouth daily.  Marland Kitchen gabapentin (NEURONTIN) 100 MG capsule Take 100 mg by mouth 2 (two)  times daily as needed.  Marland Kitchen GINKGO BILOBA PO Take 1 capsule by mouth daily.  . isosorbide mononitrate (IMDUR) 120 MG 24 hr tablet Take 1 tablet (120 mg total) by mouth daily.  . metoprolol succinate (TOPROL XL) 25 MG 24 hr tablet Take 1 tablet (25 mg  total) by mouth daily.  . milk thistle 175 MG tablet Take 175 mg by mouth daily.  . Multiple Vitamin (MULTIVITAMIN) tablet Take 1 tablet by mouth daily. Reported on 09/26/2015  . nitroGLYCERIN (NITROSTAT) 0.4 MG SL tablet Place 1 tablet (0.4 mg total) under the tongue every 5 (five) minutes x 3 doses as needed.  . Omega-3 Fatty Acids (FISH OIL) 1000 MG CAPS 1 tab po qd   . pantoprazole (PROTONIX) 40 MG tablet Take 1 tablet (40 mg total) by mouth 2 (two) times daily before a meal. 30 minutes. Try to reduce to 1x per day now or in 3 months  . ramipril (ALTACE) 10 MG capsule Take 1 capsule (10 mg total) by mouth daily.  . rosuvastatin (CRESTOR) 40 MG tablet Take 1 tablet (40 mg total) by mouth at bedtime.  . Saw Palmetto, Serenoa repens, (SAW PALMETTO PO) Take 1 capsule by mouth daily.  . SELENIUM PO Take 1 tablet by mouth daily. Reported on 09/26/2015  . valACYclovir (VALTREX) 1000 MG tablet Take 1 tablet (1,000 mg total) by mouth 2 (two) times daily. X 7 days as needed fever blister  . vitamin E 100 UNIT capsule Take 100 Units by mouth daily.     Allergies  Allergen Reactions  . Darvocet [Propoxyphene N-Acetaminophen]   . Propoxyphene Hives   No results found for this or any previous visit (from the past 2160 hour(s)). Objective  Body mass index is 33.02 kg/m. Wt Readings from Last 3 Encounters:  08/03/18 210 lb 12.8 oz (95.6 kg)  03/29/18 203 lb 6.4 oz (92.3 kg)  01/31/18 203 lb 8 oz (92.3 kg)   Temp Readings from Last 3 Encounters:  08/03/18 98.3 F (36.8 C) (Oral)  03/29/18 (!) 97.4 F (36.3 C) (Oral)  01/24/18 98.5 F (36.9 C)   BP Readings from Last 3 Encounters:  08/03/18 136/70  03/29/18 138/72  01/31/18 134/80   Pulse  Readings from Last 3 Encounters:  08/03/18 78  03/29/18 61  01/31/18 64    Physical Exam Vitals signs and nursing note reviewed.  Constitutional:      Appearance: Normal appearance. He is well-developed and well-groomed.  HENT:     Head: Normocephalic and atraumatic.     Nose: Nose normal.     Mouth/Throat:     Pharynx: Posterior oropharyngeal erythema present. No oropharyngeal exudate.  Eyes:     Conjunctiva/sclera: Conjunctivae normal.     Pupils: Pupils are equal, round, and reactive to light.  Cardiovascular:     Rate and Rhythm: Normal rate and regular rhythm.     Heart sounds: Normal heart sounds. No murmur.  Pulmonary:     Effort: Pulmonary effort is normal.     Breath sounds: Normal breath sounds.  Skin:    General: Skin is warm and dry.  Neurological:     General: No focal deficit present.     Mental Status: He is alert and oriented to person, place, and time.     Gait: Gait normal.  Psychiatric:        Attention and Perception: Attention and perception normal.        Mood and Affect: Mood and affect normal.        Speech: Speech normal.        Behavior: Behavior normal. Behavior is cooperative.        Thought Content: Thought content normal.        Cognition and Memory: Cognition and memory normal.  Judgment: Judgment normal.     Assessment   1. 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 GERD uncontrolled see HPI 3. HM Plan  1. Trial of augmentin bid 7-10 days  2. Refer [facility] GI to f/u  givne diet list  3.  flu shot today  - utdzostavax, prevnar, pna 23,Tdap ? If had 08/07/08 Consider Tdap in future Consider shingrix and hep B vaccine not immune MMR immune PSA had 03/2017 f/u with Dr. Marvetta Gibbons check at f/u   Had eye exam Dr. Gloriann Loan ~11/2017 get records  Never smoker no CT chest  [place] 04/2013 abdominal aorta normal   Colonoscopy had 05/02/15 Dr. Fuller Plan sessile polyp repeat 04/2020 per report . Saw Dr. Silvio Pate 01/2018 Refer derm annual skin checksaw derm 05/03/17  noted SK, lentigos, rosacea, Aks face and arms f/u in 1 year [facility]. [place].   Seen left neck pain 01/25/18 [facility] they think consider PT referral with PCP and could be muscle spasm seen by [facility]   Provider: Dr. Olivia Mackie McLean-Scocuzza-[facility]

## 2018-08-03 NOTE — Patient Instructions (Addendum)
Warm salt water gargles    Uveitis Uveitis is inflammation of the eye. It often affects the middle part of the eye (uvea). This area contains many of the blood vessels that supply the rest of the eye. The uvea is made up of three structures:  The middle layer of the eyeball (choroid).  The colored part of the front of the eye (iris).  The connection between the iris and the choroid (ciliary body). Uveitis can affect any part of the uvea as well as other important structures in the eye. There are many types of uveitis:  Iritis, or anterior uveitis, affects the iris. This is the most common type. It can start suddenly and can last for many weeks.  Intermediate uveitis affects the structures in the middle of the eye. This includes the fluid that fills the eye (vitreous). This type can last for years and can come and go.  Posterior uveitis affects the structures in the back of the eye. This includes the light-sensitive layer of cells that is needed for vision (retina). This is the least common type.  Panuveitis affects all layers of the eye. Uveitis can affect one eye or both eyes. It can cause short-term or long-term symptoms. Symptoms may go away and come back. Over time, the condition can damage or destroy eye structures and may lead to vision loss. What are the causes? This condition may be caused by:  Autoimmune diseases. These are diseases in which the body's defense system (immune system) mistakenly attacks the body's own tissues.  Infections that start in the eye or spread to the eye.  Inflammatory diseases that can affect the eye.  Eye injuries. In some cases, the cause may not be known. What increases the risk? The following factors may make you more likely to develop this condition:  Being 23-22 years old.  Using tobacco products, such as cigarettes.  Taking certain medicines.  Having certain inherited genes. What are the signs or symptoms? Symptoms of this condition  depend on the type of uveitis. Common symptoms include:  Eye redness.  Eye pain.  Blurred vision.  Decreased vision.  Having a blind spot in your vision.  Floating dark spots in your vision (floaters).  Seeing flashing lights (photopsia).  Sensitivity to light (photophobia).  A white section on the lower part of the iris (hypopyon). How is this diagnosed? This condition may be diagnosed based on:  An eye exam.  A vision test using eye charts.  An exam in which a scope is used to view inside the eye (ophthalmoscope or slit lamp). Eye drops may be used to widen (dilate) your pupil to make it easier to see inside your eye.  A test to measure eye pressure. You may have other medical tests to help determine the cause of your uveitis. How is this treated? Treatment for this condition may depend on the type of uveitis that you have. It should be started right away to help prevent vision loss. Treatment may include:  Medicines to treat inflammation (steroids). You may get steroids: ? As eye drops. ? As an injection into your eye. ? By mouth.  Eye drops to dilate the pupil and reduce pressure inside the eye.  In some cases, medicines may be given through an IV or through a device that is implanted inside the eye.  Other medicines may be needed depending on the cause of your uveitis.  In rare cases, surgery may be needed (vitrectomy). Follow these instructions at home:  Take over-the-counter and prescription medicines only as told by your health care provider.  Follow instructions for safely putting eye drops in your eyes.  Drink enough fluid to keep your urine pale yellow.  Follow instructions from your health care provider about any restrictions on your activities. Ask what activities are safe for you.  Do not use any products that contain nicotine or tobacco, such as cigarettes and e-cigarettes. If you need help quitting, ask your health care provider.  Keep all  follow-up visits as told by your health care provider. This is important. Contact a health care provider if:  Your symptoms do not improve. Get help right away if:  You have vision loss in either eye.  You have more redness in one eye or both eyes.  Your eyes are very sensitive to light.  You have pain or aching in either eye. Summary  Uveitis is inflammation of the eye. It often affects the middle part of the eye (uvea). This area contains many of the blood vessels that supply the rest of the eye.  Uveitis can affect one eye or both eyes. It can cause short-term or long-term symptoms. Symptoms may go away and come back.  Treatment for this condition may depend on the type of uveitis that you have. It should be started right away to help prevent vision loss.  Over time, the condition can damage or destroy eye structures and may lead to vision loss. This information is not intended to replace advice given to you by your health care provider. Make sure you discuss any questions you have with your health care provider. Document Released: 07/31/2008 Document Revised: 05/25/2017 Document Reviewed: 05/25/2017 Elsevier Interactive Patient Education  2019 Elsevier Inc.   Sore Throat A sore throat is pain, burning, irritation, or scratchiness in the throat. When you have a sore throat, you may feel pain or tenderness in your throat when you swallow or talk. Many things can cause a sore throat, including:  An infection.  Seasonal allergies.  Dryness in the air.  Irritants, such as smoke or pollution.  Radiation treatment to the area.  Gastroesophageal reflux disease (GERD).  A tumor. A sore throat is often the first sign of another sickness. It may happen with other symptoms, such as coughing, sneezing, fever, and swollen neck glands. Most sore throats go away without medical treatment. Follow these instructions at home:      Take over-the-counter medicines only as told by  your health care provider. ? If your child has a sore throat, do not give your child aspirin because of the association with Reye syndrome.  Drink enough fluids to keep your urine pale yellow.  Rest as needed.  To help with pain, try: ? Sipping warm liquids, such as broth, herbal tea, or warm water. ? Eating or drinking cold or frozen liquids, such as frozen ice pops. ? Gargling with a salt-water mixture 3-4 times a day or as needed. To make a salt-water mixture, completely dissolve -1 tsp (3-6 g) of salt in 1 cup (237 mL) of warm water. ? Sucking on hard candy or throat lozenges. ? Putting a cool-mist humidifier in your bedroom at night to moisten the air. ? Sitting in the bathroom with the door closed for 5-10 minutes while you run hot water in the shower.  Do not use any products that contain nicotine or tobacco, such as cigarettes, e-cigarettes, and chewing tobacco. If you need help quitting, ask your health care provider.  Wash your  hands well and often with soap and water. If soap and water are not available, use hand sanitizer. Contact a health care provider if:  You have a fever for more than 2-3 days.  You have symptoms that last (are persistent) for more than 2-3 days.  Your throat does not get better within 7 days.  You have a fever and your symptoms suddenly get worse.  Your child who is 3 months to 72 years old has a temperature of 102.34F (39C) or higher. Get help right away if:  You have difficulty breathing.  You cannot swallow fluids, soft foods, or your saliva.  You have increased swelling in your throat or neck.  You have persistent nausea and vomiting. Summary  A sore throat is pain, burning, irritation, or scratchiness in the throat. Many things can cause a sore throat.  Take over-the-counter medicines only as told by your health care provider. Do not give your child aspirin.  Drink plenty of fluids, and rest as needed.  Contact a health care  provider if your symptoms worsen or your sore throat does not get better within 7 days. This information is not intended to replace advice given to you by your health care provider. Make sure you discuss any questions you have with your health care provider. Document Released: 06/11/2004 Document Revised: 10/04/2017 Document Reviewed: 10/04/2017 Elsevier Interactive Patient Education  2019 [place] for Gastroesophageal Reflux Disease, Adult When you have gastroesophageal reflux disease (GERD), the foods you eat and your eating habits are very important. Choosing the right foods can help ease the discomfort of GERD. Consider working with a diet and nutrition specialist (dietitian) to help you make healthy food choices. What general guidelines should I follow?  Eating plan  Choose healthy foods low in fat, such as fruits, vegetables, whole grains, low-fat dairy products, and lean meat, fish, and poultry.  Eat frequent, small meals instead of three large meals each day. Eat your meals slowly, in a relaxed setting. Avoid bending over or lying down until 2-3 hours after eating.  Limit high-fat foods such as fatty meats or fried foods.  Limit your intake of oils, butter, and shortening to less than 8 teaspoons each day.  Avoid the following: ? Foods that cause symptoms. These may be different for different people. Keep a food diary to keep track of foods that cause symptoms. ? Alcohol. ? Drinking large amounts of liquid with meals. ? Eating meals during the 2-3 hours before bed.  Cook foods using methods other than frying. This may include baking, grilling, or broiling. Lifestyle  Maintain a healthy weight. Ask your health care provider what weight is healthy for you. If you need to lose weight, work with your health care provider to do so safely.  Exercise for at least 30 minutes on 5 or more days each week, or as told by your health care provider.  Avoid wearing  clothes that fit tightly around your waist and chest.  Do not use any products that contain nicotine or tobacco, such as cigarettes and e-cigarettes. If you need help quitting, ask your health care provider.  Sleep with the head of your bed raised. Use a wedge under the mattress or blocks under the bed frame to raise the head of the bed. What foods are not recommended? The items listed may not be a complete list. Talk with your dietitian about what dietary choices are best for you. Grains Pastries or quick breads with added fat.  [place] toast. Vegetables Deep fried vegetables. [place] fries. Any vegetables prepared with added fat. Any vegetables that cause symptoms. For some people this may include tomatoes and tomato products, chili peppers, onions and garlic, and horseradish. Fruits Any fruits prepared with added fat. Any fruits that cause symptoms. For some people this may include citrus fruits, such as oranges, grapefruit, pineapple, and lemons. Meats and other protein foods High-fat meats, such as fatty beef or pork, hot dogs, ribs, ham, sausage, salami and bacon. Fried meat or protein, including fried fish and fried chicken. Nuts and nut butters. Dairy Whole milk and chocolate milk. Sour cream. Cream. Ice cream. Cream cheese. Milk shakes. Beverages Coffee and tea, with or without caffeine. Carbonated beverages. Sodas. Energy drinks. Fruit juice made with acidic fruits (such as orange or grapefruit). Tomato juice. Alcoholic drinks. Fats and oils Butter. Margarine. Shortening. Ghee. Sweets and desserts Chocolate and cocoa. Donuts. Seasoning and other foods Pepper. Peppermint and spearmint. Any condiments, herbs, or seasonings that cause symptoms. For some people, this may include curry, hot sauce, or vinegar-based salad dressings. Summary  When you have gastroesophageal reflux disease (GERD), food and lifestyle choices are very important to help ease the discomfort of GERD.  Eat  frequent, small meals instead of three large meals each day. Eat your meals slowly, in a relaxed setting. Avoid bending over or lying down until 2-3 hours after eating.  Limit high-fat foods such as fatty meat or fried foods. This information is not intended to replace advice given to you by your health care provider. Make sure you discuss any questions you have with your health care provider. Document Released: 05/04/2005 Document Revised: 05/05/2016 Document Reviewed: 05/05/2016 Elsevier Interactive Patient Education  2019 [employer].

## 2018-08-23 ENCOUNTER — Other Ambulatory Visit: Payer: Self-pay

## 2018-08-23 ENCOUNTER — Encounter: Payer: Self-pay | Admitting: Gastroenterology

## 2018-08-23 ENCOUNTER — Ambulatory Visit (INDEPENDENT_AMBULATORY_CARE_PROVIDER_SITE_OTHER): Payer: Medicare Other | Admitting: Gastroenterology

## 2018-08-23 VITALS — Ht 69.0 in | Wt 200.0 lb

## 2018-08-23 DIAGNOSIS — Z86010 Personal history of colonic polyps: Secondary | ICD-10-CM | POA: Diagnosis not present

## 2018-08-23 DIAGNOSIS — K219 Gastro-esophageal reflux disease without esophagitis: Secondary | ICD-10-CM

## 2018-08-23 DIAGNOSIS — R6889 Other general symptoms and signs: Secondary | ICD-10-CM

## 2018-08-23 DIAGNOSIS — R0989 Other specified symptoms and signs involving the circulatory and respiratory systems: Secondary | ICD-10-CM

## 2018-08-23 NOTE — Patient Instructions (Signed)
Continue pantoprazole 40 mg twice daily.   Patient advised to avoid spicy, acidic, citrus, chocolate, mints, fruit and fruit juices.  Limit the intake of caffeine, alcohol and Soda.  Don't exercise too soon after eating.  Don't lie down within 3-4 hours of eating.  Elevate the head of your bed.  Your updated recall for EGD/Colonscopy is 04/2020. We will contact you when it gets closer to that time.   Thank you for choosing me and [facility].  Pricilla Riffle. Dagoberto Ligas., MD., [facility] Regal

## 2018-08-23 NOTE — Progress Notes (Signed)
    History of Present Illness: This is a 73 year old male with GERD.  His last office visit was in September 2019 and his symptoms were under excellent control on pantoprazole 40 mg twice daily.  He is maintained on Plavix.  We discussed EGD for further evaluation at that time however elected to defer this exam since his symptoms were under excellent control.  He was advised to contact us for any worsening or change in symptoms with routine follow-up planned for 1 year.  He relates a tightness in his throat that was worse at night and it also occurred with exertion in January.  He was given a trial of Dexilant by his PCP along with more closely following antireflux measures which completely controlled his symptoms.  He has now resumed pantoprazole 40 mg twice daily with continued attention to antireflux measures and his symptoms are completely controlled.   Current Medications, Allergies, Past Medical History, Past Surgical History, Family History and Social History were reviewed in [employer] record.  Physical Exam: Telephone visit - not performed   Assessment and Recommendations:  1.  GERD with recent flare described as a tightness in his throat, worse at night also occurring with exertion.  Intensifying antireflux measures along with a short course of Dexilant has resolved his symptoms.  Continue pantoprazole 40 mg twice daily and closely follow antireflux measures.  He is advised if he has any additional symptoms that are brought on by exertion he should contact Dr. [name] for further evaluation.  If reflux symptoms flare again recommend proceeding with EGD for further evaluation.  Recommend EGD at time of his next colonoscopy for Barrett's screening.  2.  Personal history of adenomatous colon polyps.  A 5-year interval surveillance colonoscopy is due in December 2021.  3. CAD status post CABG and coronary artery stent placement.  He is maintained on Plavix and aspirin.  See #1.   These services were provided via telemedicine.  The patient was at home in a private location and the provider was in the office, alone  Patient did consent for this telephone visit and is aware of possible charges for this service.  The other person participating in the telemedicine service was Marlon Pel, CMA who reviewed medications, allergies, past history and completed AVS.  Time spent on call: 13 minutes TIme spent on call, reviewing chart, coordinating care and CMA portion: 27 minutes

## 2018-09-15 ENCOUNTER — Telehealth: Payer: Self-pay

## 2018-09-15 ENCOUNTER — Ambulatory Visit (INDEPENDENT_AMBULATORY_CARE_PROVIDER_SITE_OTHER): Payer: Medicare Other | Admitting: Internal Medicine

## 2018-09-15 DIAGNOSIS — I10 Essential (primary) hypertension: Secondary | ICD-10-CM

## 2018-09-15 DIAGNOSIS — I959 Hypotension, unspecified: Secondary | ICD-10-CM

## 2018-09-15 DIAGNOSIS — R197 Diarrhea, unspecified: Secondary | ICD-10-CM

## 2018-09-15 DIAGNOSIS — K219 Gastro-esophageal reflux disease without esophagitis: Secondary | ICD-10-CM | POA: Diagnosis not present

## 2018-09-15 NOTE — Telephone Encounter (Signed)
Ladene Artist, MD sent to [place], Nino Glow, MD; Marlon Pel, RN        Hi Olivia Mackie, Since his GERD is not adequately controlled we should proceed with EGD to further evaluate. We will contact him to discuss scheduling EGD. Thanks, Norberto Sorenson    Discussed with Dr. Fuller Plan needs to hold Plavix for 5 days prior with Dr. Rosezella [place] permission.   Patient notified of the recommendations. He is scheduled for EGD on 10/03/18 and pre-visit on 09/21/18.  He understands we will get clarance from Dr. [name] to hold plavix.Kevin Webb

## 2018-09-15 NOTE — Telephone Encounter (Signed)
[facility] Pre-operative Risk Assessment     Request for surgical clearance:     Endoscopy Procedure  What type of surgery is being performed?     EGD with possible dilation and bx When is this surgery scheduled?    10/03/18 What type of clearance is required ?   Pharmacy  Are there any medications that need to be held prior to surgery and how long? Plavix for 5 days prior  Practice name and name of physician performing surgery?      [facility]  Lucio Edward  What is your office phone and fax number?      Phone- [phone]  [phone]  Anesthesia type (None, local, MAC, general) ?       MAC

## 2018-09-15 NOTE — Progress Notes (Signed)
Virtual Visit via Video Note  I connected with Kevin Webb  on 09/15/18 at 11:33 AM EDT by a video enabled telemedicine application and verified that I am speaking with the correct person using two identifiers.  Location patient: home Location provider:work Persons participating in the virtual visit: patient, provider  I discussed the limitations of evaluation and management by telemedicine and the availability of in person appointments. The patient expressed understanding and agreed to proceed.   HPI: Diarrhea since Monday/Tues 09/12/18 or 09/13/18 diarrhea q2 hrs loose watery. He has been on Augmentin 08/03/2018 recently and thought his diverticulitis was bothering him so took left over cipro and flagyl. He also reports eating out Tuesday having [place] at at [place] as well. He currently reports GERD in uncontrolled protonix is listed but he is taking Dexilant and not sure if he doubled the dose recently. He has tried Rolaids and Tums and 2 pills of peptobisomol as well as pedialyte. He has lost 5-6 lbs in 4 days due to diarrhea no fever, no fatigue, no sob, no cough, no loss of taste/smell COVID 19 sx's or sick exposure that he knows of   H/o HTN BP running 108/61 and 109/65 today on ramipril 10 mg qd and norvasc 5 mg qd and toporl xl 25 mg qd disc holding ramipril currently due to low BP and diarrhea he will do this   ROS: See pertinent positives and negatives per HPI.  Past Medical History:  Diagnosis Date  . Acute cholecystitis 04/18/2013  . Adenomatous colon polyp 12/2004  . Anemia   . BPH associated with nocturia   . CAD (coronary artery disease)    a. s/p CABG x4 in 2000 with LIMA-LAD, reverse SVG-IM, reverse SVG-acute margin and reverse SVG-RCA b. s/p BMS to SVG-RI in 2012 c. 12/2016: PCI/DES to SVG-RCA and PCI/DES to SVG-RI (Dr. [name])   . Carotid artery stenosis    mild   . Diabetes mellitus without complication ([place]) 01/60/1093   recent A1C 6.1 12/25/16 controlled with diet  and exercise   . Diverticulitis 2008/2009  . GERD (gastroesophageal reflux disease)    controlled as of 04/01/17   . HTN (hypertension)   . Hyperglycemia   . Hyperlipidemia   . Impingement syndrome of left shoulder 2016  . Low testosterone    Dr [name], [place] ,[place]  . Osteoarthritis of right knee    With trace effusion  . Peyronie disease   . RLS (restless legs syndrome) 08/15/2013  . Sinus bradycardia   . Thrombocytopenia ([place])   . Vitamin D deficiency     Past Surgical History:  Procedure Laterality Date  . CHOLECYSTECTOMY N/A 04/21/2013   Procedure: LAPAROSCOPIC CHOLECYSTECTOMY WITH INTRAOPERATIVE CHOLANGIOGRAM;  Surgeon: Haywood Lasso, MD;  Location: [facility];  Service: General;  Laterality: N/A;  . COLONOSCOPY W/ POLYPECTOMY  2004 & 2009    X 2; Dr Fuller Plan; due 2014  . CORONARY ANGIOPLASTY    . CORONARY ARTERY BYPASS GRAFT  04/1999   4 vessels  . CORONARY STENT INTERVENTION N/A 12/28/2016   Procedure: CORONARY STENT INTERVENTION;  Surgeon: Lorretta Harp, MD;  Location: [facility];  Service: Cardiovascular;  Laterality: N/A;  . CORONARY STENT PLACEMENT  2012   Plavix  . LAPAROSCOPIC CHOLECYSTECTOMY  04/21/2013   Dr Margot Chimes; acutecholecystitis with necrosis  . LEFT HEART CATH AND CORS/GRAFTS ANGIOGRAPHY N/A 12/28/2016   Procedure: LEFT HEART CATH AND CORS/GRAFTS ANGIOGRAPHY;  Surgeon: Lorretta Harp, MD;  Location: [facility];  Service: Cardiovascular;  Laterality: N/A;  . VASECTOMY      Family History  Problem Relation Age of Onset  . Aortic aneurysm Father 23       ? abdominal  . Heart attack Father 48  . Diabetes Other        PGaunt  . Coronary artery disease Mother        CABG in 74s  . CVA Mother        cns bleed from warfarin  . Aneurysm Mother   . Diabetes Brother   . Cholecystitis Sister   . Cancer Neg Hx   . Colon cancer Neg Hx   . Prostate cancer Neg Hx     SOCIAL HX: 1 son he is still working    Current Outpatient  Medications:  .  amLODipine (NORVASC) 5 MG tablet, TAKE ONE (1) TABLET EACH DAY, Disp: 90 tablet, Rfl: 3 .  Ascorbic Acid (VITAMIN C) 1000 MG tablet, Take 1,000 mg by mouth daily. Reported on 09/26/2015, Disp: , Rfl:  .  aspirin 81 MG tablet, Take 81 mg by mouth daily., Disp: , Rfl:  .  Cholecalciferol (VITAMIN D3) 5000 UNITS CAPS, Take by mouth daily., Disp: , Rfl:  .  ciprofloxacin (CIPRO) 500 MG tablet, Take 1 tablet (500 mg total) by mouth 2 (two) times daily. With food, Disp: 28 tablet, Rfl: 0 .  clopidogrel (PLAVIX) 75 MG tablet, Take 1 tablet (75 mg total) by mouth daily., Disp: 90 tablet, Rfl: 3 .  co-enzyme Q-10 30 MG capsule, Take 30 mg by mouth daily. , Disp: , Rfl:  .  cyclobenzaprine (FLEXERIL) 5 MG tablet, Take 1 tablet (5 mg total) by mouth 3 (three) times daily as needed for muscle spasms. (Patient taking differently: Take 5 mg by mouth as needed for muscle spasms. ), Disp: 15 tablet, Rfl: 0 .  ezetimibe (ZETIA) 10 MG tablet, Take 1 tablet (10 mg total) by mouth daily., Disp: 90 tablet, Rfl: 3 .  gabapentin (NEURONTIN) 100 MG capsule, Take 100 mg by mouth 2 (two) times daily as needed., Disp: , Rfl:  .  GINKGO BILOBA PO, Take 1 capsule by mouth daily., Disp: , Rfl:  .  isosorbide mononitrate (IMDUR) 120 MG 24 hr tablet, Take 1 tablet (120 mg total) by mouth daily., Disp: 90 tablet, Rfl: 3 .  metoprolol succinate (TOPROL XL) 25 MG 24 hr tablet, Take 1 tablet (25 mg total) by mouth daily., Disp: 90 tablet, Rfl: 2 .  milk thistle 175 MG tablet, Take 175 mg by mouth daily., Disp: , Rfl:  .  Multiple Vitamin (MULTIVITAMIN) tablet, Take 1 tablet by mouth daily. Reported on 09/26/2015, Disp: , Rfl:  .  nitroGLYCERIN (NITROSTAT) 0.4 MG SL tablet, Place 1 tablet (0.4 mg total) under the tongue every 5 (five) minutes x 3 doses as needed., Disp: 25 tablet, Rfl: 11 .  Omega-3 Fatty Acids (FISH OIL) 1000 MG CAPS, 1 tab po qd , Disp: , Rfl:  .  pantoprazole (PROTONIX) 40 MG tablet, Take 1 tablet  (40 mg total) by mouth 2 (two) times daily before a meal. 30 minutes. Try to reduce to 1x per day now or in 3 months (Patient taking differently: Take 40 mg by mouth 2 (two) times daily. ), Disp: 180 tablet, Rfl: 0 .  ramipril (ALTACE) 10 MG capsule, Take 1 capsule (10 mg total) by mouth daily., Disp: 90 capsule, Rfl: 3 .  rosuvastatin (CRESTOR) 40 MG tablet, Take 1 tablet (40 mg total)  by mouth at bedtime., Disp: 90 tablet, Rfl: 3 .  SELENIUM PO, Take 1 tablet by mouth daily. Reported on 09/26/2015, Disp: , Rfl:  .  valACYclovir (VALTREX) 1000 MG tablet, Take 1 tablet (1,000 mg total) by mouth 2 (two) times daily. X 7 days as needed fever blister, Disp: 60 tablet, Rfl: 11 .  vitamin E 100 UNIT capsule, Take 100 Units by mouth daily.  , Disp: , Rfl:   EXAM:  VITALS per patient if applicable:  GENERAL: alert, oriented, appears well and in no acute distress  HEENT: atraumatic, conjunttiva clear, no obvious abnormalities on inspection of external nose and ears  NECK: normal movements of the head and neck  LUNGS: on inspection no signs of respiratory distress, breathing rate appears normal, no obvious gross SOB, gasping or wheezing  CV: no obvious cyanosis  MS: moves all visible extremities without noticeable abnormality  PSYCH/NEURO: pleasant and cooperative, no obvious depression or anxiety, speech and thought processing grossly intact  ASSESSMENT AND PLAN:  Discussed the following assessment and plan:  Diarrhea, unspecified type - Plan: Stool Culture, Clostridium Difficile by PCR([facility]), GI pathogen panel by PCR, stool Due to recent Abs Cipro/Flagyl, Augmentin r/o infectious causes I.e Cdiff, stool Cx, GI path panel Advised hold immodium for now and try BRAT diet, peptobismol, pedialyte continue and oral hydration  Stop immodium pending stool studies CC Dr. Fuller Plan   GERD uncontrolled  CC GI Dr. Fuller Plan of note EGD for uncontrolled GERD pending due to COVID 19 per last note  will do sooner than 04/2020 (with colonoscopy due 04/2020) if sxs uncontrolled per GI pt should be on protonix 40 mg bid  Essential hypertension Hold ramipril 10 mg now with diarrhea and hypotension   HM  flu shotutd  - utdzostavax, prevnar, pna 23,Tdap ? If had 08/07/08 Consider Tdap in future Consider shingrix and hep B vaccine not immune MMR immune PSA had 03/2017 f/u with Dr. Marvetta Gibbons check at f/u  Had eye exam Dr. Kirke Corin get records Never smoker no CT chest  [place] 04/2013 abdominal aorta normal   Colonoscopy had 05/02/15 Dr. Fuller Plan sessile polyp repeat 04/2020 per report. Saw Dr. Silvio Pate 01/2018 and 08/23/2018  saw derm 05/03/17 noted SK, lentigos, rosacea, Aks face and arms f/u in 1 year [facility]. [place].   Seen left neck pain 01/25/18 [facility] they think consider PT referral with PCP and could be muscle spasm seen by [facility] I discussed the assessment and treatment plan with the patient. The patient was provided an opportunity to ask questions and all were answered. The patient agreed with the plan and demonstrated an understanding of the instructions.   The patient was advised to call back or seek an in-person evaluation if the symptoms worsen or if the condition fails to improve as anticipated.  Time spent 15 minutes  Delorise Jackson, MD

## 2018-09-15 NOTE — Telephone Encounter (Signed)
Dr. [name], Please comment on holding plavix.

## 2018-09-16 ENCOUNTER — Telehealth: Payer: Self-pay | Admitting: Internal Medicine

## 2018-09-16 NOTE — Telephone Encounter (Signed)
OK to hold Plavix as needed for the procedure.   

## 2018-09-16 NOTE — Telephone Encounter (Addendum)
   Primary Cardiologist: Minus Breeding, MD  Chart reviewed as part of pre-operative protocol coverage. Patient was contacted 09/16/2018 in reference to pre-operative risk assessment for pending surgery as outlined below.  Kevin Webb was last seen on 01/05/18 by Dr. [name].  The patient denies anginal symptoms. He states he has discomfort in his chest when he eats food, which has prompted a GI workup. He denies anginal symptoms and does not feel his symptoms are cardiac in nature.   [facility] requested guidance on holding plavix.  Per Dr. [name]:  OK to hold plavix as needed for procedure.   I will route this recommendation to the requesting party via Epic fax function and remove from pre-op pool.  Please call with questions.  Ledora Bottcher, PA 09/16/2018, 4:35 PM

## 2018-09-16 NOTE — Telephone Encounter (Signed)
Called pt 12 hrs w/o diarrhea not completely firm stool doing better has not done stool sample and may not will wait until Monday 09/19/2018 to see if he is better. He is not having liquid stools any longer  TMS

## 2018-09-21 ENCOUNTER — Other Ambulatory Visit: Payer: Self-pay

## 2018-09-21 ENCOUNTER — Ambulatory Visit (AMBULATORY_SURGERY_CENTER): Payer: Self-pay

## 2018-09-21 VITALS — Ht 69.0 in | Wt 200.0 lb

## 2018-09-21 DIAGNOSIS — K219 Gastro-esophageal reflux disease without esophagitis: Secondary | ICD-10-CM

## 2018-09-21 NOTE — Progress Notes (Signed)
Denies allergies to eggs or soy products. Denies complication of anesthesia or sedation. Denies use of weight loss medication. Denies use of O2.   Emmi instructions given to patient.   Pre-Visit was conducted by phone due to Covid 19. Instructions for endoscopy were reviewed with patient and mailed to confirmed address. Patient was encouraged to call if he had any questions or concerns.

## 2018-09-30 ENCOUNTER — Telehealth: Payer: Self-pay | Admitting: *Deleted

## 2018-09-30 ENCOUNTER — Ambulatory Visit: Payer: Medicare Other | Admitting: Internal Medicine

## 2018-09-30 NOTE — Telephone Encounter (Signed)
Covid-19 travel screening questions  Have you traveled in the last 14 days? no If yes where?  Do you now or have you had a fever in the last 14 days? no  Do you have any respiratory symptoms of shortness of breath or cough now or in the last 14 days? no  Do you have any family members or close contacts with diagnosed or suspected Covid-19? No  Pt is aware that care partner will be waiting in car during procedure.  He will wear a mask into bulding

## 2018-10-03 ENCOUNTER — Encounter: Payer: Self-pay | Admitting: Gastroenterology

## 2018-10-03 ENCOUNTER — Other Ambulatory Visit: Payer: Self-pay

## 2018-10-03 ENCOUNTER — Ambulatory Visit (AMBULATORY_SURGERY_CENTER): Payer: Medicare Other | Admitting: Gastroenterology

## 2018-10-03 VITALS — BP 98/74 | HR 55 | Temp 98.5°F | Resp 12 | Ht 69.0 in | Wt 200.0 lb

## 2018-10-03 DIAGNOSIS — R0989 Other specified symptoms and signs involving the circulatory and respiratory systems: Secondary | ICD-10-CM

## 2018-10-03 DIAGNOSIS — K219 Gastro-esophageal reflux disease without esophagitis: Secondary | ICD-10-CM

## 2018-10-03 DIAGNOSIS — R1310 Dysphagia, unspecified: Secondary | ICD-10-CM | POA: Diagnosis not present

## 2018-10-03 DIAGNOSIS — K449 Diaphragmatic hernia without obstruction or gangrene: Secondary | ICD-10-CM

## 2018-10-03 DIAGNOSIS — K3189 Other diseases of stomach and duodenum: Secondary | ICD-10-CM

## 2018-10-03 DIAGNOSIS — R079 Chest pain, unspecified: Secondary | ICD-10-CM

## 2018-10-03 DIAGNOSIS — R198 Other specified symptoms and signs involving the digestive system and abdomen: Secondary | ICD-10-CM

## 2018-10-03 MED ORDER — SODIUM CHLORIDE 0.9 % IV SOLN: 500.0000 mL | Freq: Once | INTRAVENOUS | Status: DC

## 2018-10-03 NOTE — Progress Notes (Signed)
To PACU, VSS. Report to [email or web address] 

## 2018-10-03 NOTE — Op Note (Signed)
[facility] Patient Name: Kevin Webb Procedure Date: 10/03/2018 9:01 AM MRN: [id number] Endoscopist: Ladene Artist , MD Age: 73 Referring MD:  Date of Birth: 1946-05-14 Gender: Male Account #: [id number] Procedure:                Upper GI endoscopy Indications:              Gastroesophageal reflux disease, Unexplained chest                            pain, Globus sensation Procedure:                Pre-Anesthesia Assessment:                           - Prior to the procedure, a History and Physical                            was performed, and patient medications and                            allergies were reviewed. The patient's tolerance of                            previous anesthesia was also reviewed. The risks                            and benefits of the procedure and the sedation                            options and risks were discussed with the patient.                            All questions were answered, and informed consent                            was obtained. Prior Anticoagulants: The patient                            last took Plavix (clopidogrel) 5 days prior to the                            procedure. ASA Grade Assessment: II - A patient                            with mild systemic disease. After reviewing the                            risks and benefits, the patient was deemed in                            satisfactory condition to undergo the procedure.                           After obtaining informed consent, the endoscope was  passed under direct vision. Throughout the                            procedure, the patient's blood pressure, pulse, and                            oxygen saturations were monitored continuously. The                            Endoscope was introduced through the mouth, and                            advanced to the second part of duodenum. The upper                            GI  endoscopy was accomplished without difficulty.                            The patient tolerated the procedure well. 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                           The examined esophagus was otherwise normal. Z-line                            at 37 cm.                           A small (2 cm) hiatal hernia was present.                           The exam of the stomach was otherwise normal.                           Three 5-6 mm mucosal nodules with a localized                            distribution were found in the duodenal bulb.                            Biopsies were taken with a cold forceps for                            histology.                           The exam of the duodenum was otherwise normal.  Complications:            No immediate complications. Estimated Blood Loss:     Estimated blood loss was minimal. Impression:               - Esophageal inlet patches.                           - Otherwise normal esophagus.                           - Small hiatal hernia.                           - Three mucosal nodules found in the duodenum.                            Biopsied. Recommendation:           - Patient has a contact number available for                            emergencies. The signs and symptoms of potential                            delayed complications were discussed with the                            patient. Return to normal activities tomorrow.                            Written discharge instructions were provided to the                            patient.                           - Resume previous diet.                           - Follow antireflux measures.                           - Continue present medications.                           - Await pathology  results.                           - Resume Plavix (clopidogrel) at prior dose                            tomorrow. Refer to managing physician for further                            adjustment of therapy.                           - Globus sensation could be related to  GERD.                           - Exertional chest pain needs further evaluation                            with Cardiology. Ladene Artist, MD 10/03/2018 9:29:07 AM This report has been signed electronically.

## 2018-10-03 NOTE — Progress Notes (Signed)
Called to room to assist during endoscopic procedure.  Patient ID and intended procedure confirmed with present staff. Received instructions for my participation in the procedure from the performing physician.  

## 2018-10-03 NOTE — Patient Instructions (Signed)
YOU HAD AN ENDOSCOPIC PROCEDURE TODAY AT [facility]:   Refer to the procedure report that was given to you for any specific questions about what was found during the examination.  If the procedure report does not answer your questions, please call your gastroenterologist to clarify.  If you requested that your care partner not be given the details of your procedure findings, then the procedure report has been included in a sealed envelope for you to review at your convenience later.  YOU SHOULD EXPECT: Some feelings of bloating in the abdomen. Passage of more gas than usual.  Walking can help get rid of the air that was put into your GI tract during the procedure and reduce the bloating. If you had a lower endoscopy (such as a colonoscopy or flexible sigmoidoscopy) you may notice spotting of blood in your stool or on the toilet paper. If you underwent a bowel prep for your procedure, you may not have a normal bowel movement for a few days.  Please Note:  You might notice some irritation and congestion in your nose or some drainage.  This is from the oxygen used during your procedure.  There is no need for concern and it should clear up in a day or so.  SYMPTOMS TO REPORT IMMEDIATELY:     Following upper endoscopy (EGD)  Vomiting of blood or coffee ground material  New chest pain or pain under the shoulder blades  Painful or persistently difficult swallowing  New shortness of breath  Fever of 100F or higher  Black, tarry-looking stools  For urgent or emergent issues, a gastroenterologist can be reached at any hour by calling [phone].   DIET:  We do recommend a small meal at first, but then you may proceed to your regular diet.  Drink plenty of fluids but you should avoid alcoholic beverages for 24 hours.  ACTIVITY:  You should plan to take it easy for the rest of today and you should NOT DRIVE or use heavy machinery until tomorrow (because of the sedation medicines  used during the test).    FOLLOW UP: Our staff will call the number listed on your records 48-72 hours following your procedure to check on you and address any questions or concerns that you may have regarding the information given to you following your procedure. If we do not reach you, we will leave a message.  We will attempt to reach you two times.  During this call, we will ask if you have developed any symptoms of COVID 19. If you develop any symptoms (for example fever, flu-like symptoms, shortness of breath, cough etc.) before then, please call [phone].  If any biopsies were taken you will be contacted by phone or by letter within the next 1-3 weeks.  Please call us at [phone] if you have not heard about the biopsies in 3 weeks.    SIGNATURES/CONFIDENTIALITY: You and/or your care partner have signed paperwork which will be entered into your electronic medical record.  These signatures attest to the fact that that the information above on your After Visit Summary has been reviewed and is understood.  Full responsibility of the confidentiality of this discharge information lies with you and/or your care-partner.

## 2018-10-04 ENCOUNTER — Telehealth: Payer: Self-pay | Admitting: Cardiology

## 2018-10-04 NOTE — Telephone Encounter (Signed)
Sent MyChart message to patient regarding virtual visit with Dr. [name] this week.

## 2018-10-05 ENCOUNTER — Telehealth: Payer: Self-pay

## 2018-10-05 NOTE — Progress Notes (Signed)
Virtual Visit via Video Note   This visit type was conducted due to national recommendations for restrictions regarding the COVID-19 Pandemic (e.g. social distancing) in an effort to limit this patient's exposure and mitigate transmission in our community.  Due to his co-morbid illnesses, this patient is at least at moderate risk for complications without adequate follow up.  This format is felt to be most appropriate for this patient at this time.  All issues noted in this document were discussed and addressed.  A limited physical exam was performed with this format.  Please refer to the patient's chart for his consent to telehealth for [facility].   Date:  10/06/2018   ID:  Kevin Webb, DOB July 20, 1945, MRN [id number]  Patient Location: Home Provider Location: Home  PCP:  McLean-Scocuzza, Nino Glow, MD  Cardiologist:  Minus Breeding, MD  Electrophysiologist:  None   Evaluation Performed:  Follow-Up Visit  Chief Complaint:  Chest pain  History of Present Illness:    Kevin Webb is a 73 y.o. male for evaluation of CAD.  He was admitted from 8/10- 12/29/2016 for evaluation of chest pain with elevated troponin.  Cardiac catheterization on 12/28/2016 demonstrated severe native disease but the LIMA-LAD was patent with 75% stenosis noted along the SVG-RCA and 90% stenosis along the SVG-RI. Successful PCI with DES placement was performed to both lesions. He was started on DAPT with ASA and Brilinta along with being continued on BB and statin therapy.  I saw him in the ED recently for evaluation of chest pain.  There was no objective evidence of ischemia and he was discharged from the ED.   Afterward he had some increased dyspnea.  He restarted isosorbide.    He had an EGD recently because of discomfort.  He had no significant findings that were felt by Dr. Fuller Plan to be a cause of the chest discomfort that he is describing.  He describes the pain up in his throat.  He says it is somewhat  reminiscent of symptoms he had previously which is been cardiac but also symptoms which have been GI.  It was not like his end STEMI that he had a few years ago.  He says he really gets this discomfort if he is eating and then goes and does activity like walks on a treadmill.  Admitted discomfort going up into his throat.  He notices it with drinking a beer.  It can happen with coughing although he drank coffee this morning and walked on the treadmill and did not have any symptoms.  He does not have any resting shortness of breath, PND or orthopnea.  He does not have any palpitations, presyncope or syncope.  He otherwise has felt okay.  He is try to be active.  He is not been eating well and he is gained some weight.  The patient does not have symptoms concerning for COVID-19 infection (fever, chills, cough, or new shortness of breath).    Past Medical History:  Diagnosis Date  . Acute cholecystitis 04/18/2013  . Adenomatous colon polyp 12/2004  . Anemia   . Blood transfusion without reported diagnosis   . BPH associated with nocturia   . CAD (coronary artery disease)    a. s/p CABG x4 in 2000 with LIMA-LAD, reverse SVG-IM, reverse SVG-acute margin and reverse SVG-RCA b. s/p BMS to SVG-RI in 2012 c. 12/2016: PCI/DES to SVG-RCA and PCI/DES to SVG-RI (Dr. [name])   . Carotid artery stenosis    mild   .  Cataract   . Clotting disorder ([place])   . Diabetes mellitus without complication ([place]) 56/81/2751   recent A1C 6.1 12/25/16 controlled with diet and exercise   . Diverticulitis 2008/2009  . GERD (gastroesophageal reflux disease)    controlled as of 04/01/17   . HTN (hypertension)   . Hyperglycemia   . Hyperlipidemia   . Impingement syndrome of left shoulder 2016  . Low testosterone    Dr [name], [place] ,[place]  . Myocardial infarction ([place])   . Osteoarthritis of right knee    With trace effusion  . Peyronie disease   . RLS (restless legs syndrome) 08/15/2013  . Sinus bradycardia   .  Thrombocytopenia ([place])   . Vitamin D deficiency    Past Surgical History:  Procedure Laterality Date  . CHOLECYSTECTOMY N/A 04/21/2013   Procedure: LAPAROSCOPIC CHOLECYSTECTOMY WITH INTRAOPERATIVE CHOLANGIOGRAM;  Surgeon: Haywood Lasso, MD;  Location: [place];  Service: General;  Laterality: N/A;  . CHOLECYSTECTOMY    . COLONOSCOPY W/ POLYPECTOMY  2004 & 2009    X 2; Dr Fuller Plan; due 2014  . CORONARY ANGIOPLASTY    . CORONARY ARTERY BYPASS GRAFT  04/1999   4 vessels  . CORONARY STENT INTERVENTION N/A 12/28/2016   Procedure: CORONARY STENT INTERVENTION;  Surgeon: Lorretta Harp, MD;  Location: [place] [facility];  Service: Cardiovascular;  Laterality: N/A;  . CORONARY STENT PLACEMENT  2012   Plavix  . LAPAROSCOPIC CHOLECYSTECTOMY  04/21/2013   Dr Margot Chimes; acutecholecystitis with necrosis  . LEFT HEART CATH AND CORS/GRAFTS ANGIOGRAPHY N/A 12/28/2016   Procedure: LEFT HEART CATH AND CORS/GRAFTS ANGIOGRAPHY;  Surgeon: Lorretta Harp, MD;  Location: [place] [facility];  Service: Cardiovascular;  Laterality: N/A;  . VASECTOMY       Current Meds  Medication Sig  . amLODipine (NORVASC) 5 MG tablet TAKE ONE (1) TABLET EACH DAY  . Ascorbic Acid (VITAMIN C) 1000 MG tablet Take 1,000 mg by mouth daily. Reported on 09/26/2015  . aspirin 81 MG tablet Take 81 mg by mouth daily.  . Cholecalciferol (VITAMIN D3) 5000 UNITS CAPS Take by mouth daily.  . clopidogrel (PLAVIX) 75 MG tablet Take 1 tablet (75 mg total) by mouth daily.  Marland Kitchen co-enzyme Q-10 30 MG capsule Take 30 mg by mouth daily.   . cyclobenzaprine (FLEXERIL) 5 MG tablet Take 1 tablet (5 mg total) by mouth 3 (three) times daily as needed for muscle spasms. (Patient taking differently: Take 5 mg by mouth as needed for muscle spasms. )  . ezetimibe (ZETIA) 10 MG tablet Take 1 tablet (10 mg total) by mouth daily.  Marland Kitchen gabapentin (NEURONTIN) 100 MG capsule Take 100 mg by mouth 2 (two) times daily as needed.  Marland Kitchen GINKGO BILOBA PO Take 1 capsule by  mouth daily.  . isosorbide mononitrate (IMDUR) 120 MG 24 hr tablet Take 1 tablet (120 mg total) by mouth daily.  . metoprolol succinate (TOPROL XL) 25 MG 24 hr tablet Take 1 tablet (25 mg total) by mouth daily.  . Niacin CR 1000 MG TBCR niacin ER 1,000 mg tablet,extended release 24 hr  . nitroGLYCERIN (NITROSTAT) 0.4 MG SL tablet Place 1 tablet (0.4 mg total) under the tongue every 5 (five) minutes x 3 doses as needed.  . Omega-3 Fatty Acids (FISH OIL) 1000 MG CAPS 1 tab po qd   . pantoprazole (PROTONIX) 40 MG tablet Take 1 tablet (40 mg total) by mouth 2 (two) times daily before a meal. 30 minutes. Try to reduce  to 1x per day now or in 3 months (Patient taking differently: Take 40 mg by mouth 2 (two) times daily. )  . ramipril (ALTACE) 10 MG capsule Take 1 capsule (10 mg total) by mouth daily.  . rosuvastatin (CRESTOR) 40 MG tablet Take 1 tablet (40 mg total) by mouth at bedtime.  . vitamin E 100 UNIT capsule Take 100 Units by mouth daily.    . [DISCONTINUED] valACYclovir (VALTREX) 1000 MG tablet Take 1 tablet (1,000 mg total) by mouth 2 (two) times daily. X 7 days as needed fever blister     Allergies:   Darvocet [propoxyphene n-acetaminophen] and Propoxyphene   Social History   Tobacco Use  . Smoking status: Never Smoker  . Smokeless tobacco: Never Used  Substance Use Topics  . Alcohol use: Yes    Alcohol/week: 10.0 - 12.0 standard drinks    Types: 10 - 12 Cans of beer per week    Comment: socially   . Drug use: No     Family Hx: The patient's family history includes Aneurysm in his mother; Aortic aneurysm (age of onset: 53) in his father; CVA in his mother; Cholecystitis in his sister; Coronary artery disease in his mother; Diabetes in his brother and another family member; Heart attack (age of onset: 56) in his father. There is no history of Cancer, Colon cancer, Prostate cancer, Esophageal cancer, Stomach cancer, or Rectal cancer.  ROS:   Please see the history of present  illness.    As stated in the HPI and negative for all other systems.   Prior CV studies:   The following studies were reviewed today:    Labs/Other Tests and Data Reviewed:    EKG:  No ECG reviewed.  Recent Labs: 12/23/2017: ALT 12; Hemoglobin 13.7; Platelets 152.0 03/29/2018: BUN 10; Creatinine, Ser 0.91; Potassium 4.7; Sodium 139; TSH 1.75   Recent Lipid Panel Lab Results  Component Value Date/Time   CHOL 137 03/29/2018 03:54 PM   TRIG 108.0 03/29/2018 03:54 PM   HDL 52.30 03/29/2018 03:54 PM   CHOLHDL 3 03/29/2018 03:54 PM   LDLCALC 63 03/29/2018 03:54 PM   LDLDIRECT 91.0 03/30/2016 11:44 AM    Wt Readings from Last 3 Encounters:  10/06/18 195 lb (88.5 kg)  10/03/18 200 lb (90.7 kg)  09/21/18 200 lb (90.7 kg)     Objective:    Vital Signs:  BP 126/71   Pulse 70   Ht 5\' 8"  (1.727 m)   Wt 195 lb (88.5 kg)   BMI 29.65 kg/m    VITAL SIGNS:  reviewed GEN:  no acute distress EYES:  sclerae anicteric, EOMI - Extraocular Movements Intact NEURO:  alert and oriented x 3, no obvious focal deficit PSYCH:  normal affect  ASSESSMENT & PLAN:    Coronary atherosclerosis of artery bypass graft -  At this point is very hard to know whether his symptoms could be cardiac and they sound much more GI.  He will he thinks it is related to his food and he wants to try to do much better with his diet as he reports is been very poor since the pandemic.  Over the last couple days he is trying to do better and is not had any discomfort.  At this point I think this is reasonable but if he has further discomfort he will probably need stress perfusion imaging.  HYPERLIPIDEMIA -  His last LDL was 63 with an HDL of 52.  He will continue with meds as  listed.   HYPERTENSION -  Blood pressures well controlled.  He will continue the meds as listed.  DM  The last A1c that I see is 6.4.  CAROTID DISEASE He has very mild disease and will follow this probably with repeat testing in a few  years.  He needs continued risk option.   COVID-19 Education: The signs and symptoms of COVID-19 were discussed with the patient and how to seek care for testing (follow up with PCP or arrange E-visit).  The importance of social distancing was discussed today.  Time:   Today, I have spent 20 minutes with the patient with telehealth technology discussing the above problems.     Medication Adjustments/Labs and Tests Ordered: Current medicines are reviewed at length with the patient today.  Concerns regarding medicines are outlined above.   Tests Ordered: No orders of the defined types were placed in this encounter.   Medication Changes: No orders of the defined types were placed in this encounter.   Disposition:  Follow up I will see him in 4 months in the clinic  Signed, Minus Breeding, MD  10/06/2018 11:36 AM    [place]

## 2018-10-05 NOTE — Telephone Encounter (Signed)
  Follow up Call-  Call back number 10/03/2018  Post procedure Call Back phone  # [phone]  Permission to leave phone message Yes  Some recent data might be hidden     Patient questions:  Do you have a fever, pain , or abdominal swelling? No. Pain Score  0 *  Have you tolerated food without any problems? Yes.    Have you been able to return to your normal activities? Yes.    Do you have any questions about your discharge instructions: Diet   No. Medications  No. Follow up visit  No.  Do you have questions or concerns about your Care? No.  Actions: * If pain score is 4 or above: No action needed, pain <4.  1. Have you developed a fever since your procedure? no  2.   Have you had an respiratory symptoms (SOB or cough) since your procedure? no  3.   Have you tested positive for COVID 19 since your procedure no  3.   Have you had any family members/close contacts diagnosed with the COVID 19 since your procedure?  no   If any of these questions are a yes, please inquire if patient has been seen by family doctor and route this note to Joylene John, Therapist, sports.

## 2018-10-06 ENCOUNTER — Telehealth (INDEPENDENT_AMBULATORY_CARE_PROVIDER_SITE_OTHER): Payer: Medicare Other | Admitting: Cardiology

## 2018-10-06 ENCOUNTER — Encounter: Payer: Self-pay | Admitting: Cardiology

## 2018-10-06 VITALS — BP 126/71 | HR 70 | Ht 68.0 in | Wt 195.0 lb

## 2018-10-06 DIAGNOSIS — Z7189 Other specified counseling: Secondary | ICD-10-CM

## 2018-10-06 DIAGNOSIS — I2510 Atherosclerotic heart disease of native coronary artery without angina pectoris: Secondary | ICD-10-CM | POA: Diagnosis not present

## 2018-10-06 DIAGNOSIS — E785 Hyperlipidemia, unspecified: Secondary | ICD-10-CM

## 2018-10-06 HISTORY — DX: Other specified counseling: Z71.89

## 2018-10-06 NOTE — Patient Instructions (Signed)
Medication Instructions:  Continue current medications  If you need a refill on your cardiac medications before your next appointment, please call your pharmacy.  Labwork: None Ordered .  Testing/Procedures: None Ordered  Follow-Up: You will need a follow up appointment in 4 months.  Please call our office 2 months in advance to schedule this appointment.  You may see James Hochrein, MD or one of the following Advanced Practice Providers on your designated Care Team:   Rhonda Barrett, PA-C . Kathryn Lawrence, DNP, ANP    At [facility], you and your health needs are our priority.  As part of our continuing mission to provide you with exceptional heart care, we have created designated Provider Care Teams.  These Care Teams include your primary Cardiologist (physician) and Advanced Practice Providers (APPs -  Physician Assistants and Nurse Practitioners) who all work together to provide you with the care you need, when you need it.  Thank you for choosing [facility] at [facility]!!     

## 2018-10-11 ENCOUNTER — Encounter: Payer: Self-pay | Admitting: Gastroenterology

## 2018-11-15 ENCOUNTER — Other Ambulatory Visit: Payer: Self-pay

## 2018-11-15 ENCOUNTER — Ambulatory Visit (INDEPENDENT_AMBULATORY_CARE_PROVIDER_SITE_OTHER): Payer: Medicare Other

## 2018-11-15 ENCOUNTER — Ambulatory Visit (INDEPENDENT_AMBULATORY_CARE_PROVIDER_SITE_OTHER): Payer: Medicare Other | Admitting: Internal Medicine

## 2018-11-15 DIAGNOSIS — K219 Gastro-esophageal reflux disease without esophagitis: Secondary | ICD-10-CM

## 2018-11-15 DIAGNOSIS — E119 Type 2 diabetes mellitus without complications: Secondary | ICD-10-CM | POA: Diagnosis not present

## 2018-11-15 DIAGNOSIS — R7303 Prediabetes: Secondary | ICD-10-CM | POA: Diagnosis not present

## 2018-11-15 DIAGNOSIS — I10 Essential (primary) hypertension: Secondary | ICD-10-CM

## 2018-11-15 DIAGNOSIS — I2510 Atherosclerotic heart disease of native coronary artery without angina pectoris: Secondary | ICD-10-CM | POA: Diagnosis not present

## 2018-11-15 DIAGNOSIS — Z0000 Encounter for general adult medical examination without abnormal findings: Secondary | ICD-10-CM | POA: Diagnosis not present

## 2018-11-15 DIAGNOSIS — E559 Vitamin D deficiency, unspecified: Secondary | ICD-10-CM

## 2018-11-15 DIAGNOSIS — Z125 Encounter for screening for malignant neoplasm of prostate: Secondary | ICD-10-CM | POA: Diagnosis not present

## 2018-11-15 DIAGNOSIS — E785 Hyperlipidemia, unspecified: Secondary | ICD-10-CM | POA: Diagnosis not present

## 2018-11-15 DIAGNOSIS — M542 Cervicalgia: Secondary | ICD-10-CM | POA: Diagnosis not present

## 2018-11-15 DIAGNOSIS — Z1389 Encounter for screening for other disorder: Secondary | ICD-10-CM

## 2018-11-15 NOTE — Progress Notes (Signed)
Telephone Note  I connected with Kevin Webb  on 11/15/18 at 11:25 AM EDT by telephone and verified that I am speaking with the correct person using two identifiers.  Location patient: home Location provider:work  Persons participating in the virtual visit: patient, provider  I discussed the limitations of evaluation and management by telemedicine and the availability of in person appointments. The patient expressed understanding and agreed to proceed.   HPI: 1. HTN BP 120s-130s/70s on norvasc 5 mg qd, Toprol XL 25 mg qd, altace 10 mg qd -his BP was 130/78 yesterday 2. CAD denies CP currently and never had NM stress test with Dr. [name]  3. GERD-with increased sx's triggers are certain foods EGD with GI negative Bx he takes protonix 40 mg bid  4. Neck pain chronic Xray 01/2018 reviewed with narrowing and anterolisthesis he saw the Chiropractor Dr. Jacky Kindle today  5. H/o DM 2 his cbgs 1 teens to 120s    ROS: See pertinent positives and negatives per HPI.  Past Medical History:  Diagnosis Date  . Acute cholecystitis 04/18/2013  . Adenomatous colon polyp 12/2004  . Anemia   . Blood transfusion without reported diagnosis   . BPH associated with nocturia   . CAD (coronary artery disease)    a. s/p CABG x4 in 2000 with LIMA-LAD, reverse SVG-IM, reverse SVG-acute margin and reverse SVG-RCA b. s/p BMS to SVG-RI in 2012 c. 12/2016: PCI/DES to SVG-RCA and PCI/DES to SVG-RI (Dr. [name])   . Carotid artery stenosis    mild   . Cataract   . Clotting disorder ([place])   . Diabetes mellitus without complication ([place]) 40/98/1191   recent A1C 6.1 12/25/16 controlled with diet and exercise   . Diverticulitis 2008/2009  . GERD (gastroesophageal reflux disease)    controlled as of 04/01/17   . HTN (hypertension)   . Hx of dysplastic nevus 05/04/2018   upper back spinal   . Hyperglycemia   . Hyperlipidemia   . Impingement syndrome of left shoulder 2016  . Low testosterone    Dr [name], [place]  ,[place]  . Myocardial infarction ([place])   . Osteoarthritis of right knee    With trace effusion  . Peyronie disease   . RLS (restless legs syndrome) 08/15/2013  . Sinus bradycardia   . Thrombocytopenia ([place])   . Vitamin D deficiency     Past Surgical History:  Procedure Laterality Date  . CHOLECYSTECTOMY N/A 04/21/2013   Procedure: LAPAROSCOPIC CHOLECYSTECTOMY WITH INTRAOPERATIVE CHOLANGIOGRAM;  Surgeon: Haywood Lasso, MD;  Location: [facility];  Service: General;  Laterality: N/A;  . CHOLECYSTECTOMY    . COLONOSCOPY W/ POLYPECTOMY  2004 & 2009    X 2; Dr Fuller Plan; due 2014  . CORONARY ANGIOPLASTY    . CORONARY ARTERY BYPASS GRAFT  04/1999   4 vessels  . CORONARY STENT INTERVENTION N/A 12/28/2016   Procedure: CORONARY STENT INTERVENTION;  Surgeon: Lorretta Harp, MD;  Location: [facility];  Service: Cardiovascular;  Laterality: N/A;  . CORONARY STENT PLACEMENT  2012   Plavix  . LAPAROSCOPIC CHOLECYSTECTOMY  04/21/2013   Dr Margot Chimes; acutecholecystitis with necrosis  . LEFT HEART CATH AND CORS/GRAFTS ANGIOGRAPHY N/A 12/28/2016   Procedure: LEFT HEART CATH AND CORS/GRAFTS ANGIOGRAPHY;  Surgeon: Lorretta Harp, MD;  Location: [facility];  Service: Cardiovascular;  Laterality: N/A;  . VASECTOMY      Family History  Problem Relation Age of Onset  . Aortic aneurysm Father 38       ?  abdominal  . Heart attack Father 51  . Diabetes Other        PGaunt  . Coronary artery disease Mother        CABG in 54s  . CVA Mother        cns bleed from warfarin  . Aneurysm Mother   . Diabetes Brother   . Cholecystitis Sister   . Cancer Neg Hx   . Colon cancer Neg Hx   . Prostate cancer Neg Hx   . Esophageal cancer Neg Hx   . Stomach cancer Neg Hx   . Rectal cancer Neg Hx     SOCIAL HX: Remarried 2014 1 son age 21 as of 03/2017    Current Outpatient Medications:  .  amLODipine (NORVASC) 5 MG tablet, TAKE ONE (1) TABLET EACH DAY, Disp: 90 tablet, Rfl: 3 .  Ascorbic Acid  (VITAMIN C) 1000 MG tablet, Take 1,000 mg by mouth daily. Reported on 09/26/2015, Disp: , Rfl:  .  aspirin 81 MG tablet, Take 81 mg by mouth daily., Disp: , Rfl:  .  Cholecalciferol (VITAMIN D3) 5000 UNITS CAPS, Take by mouth daily., Disp: , Rfl:  .  clopidogrel (PLAVIX) 75 MG tablet, Take 1 tablet (75 mg total) by mouth daily., Disp: 90 tablet, Rfl: 3 .  co-enzyme Q-10 30 MG capsule, Take 30 mg by mouth daily. , Disp: , Rfl:  .  cyclobenzaprine (FLEXERIL) 5 MG tablet, Take 1 tablet (5 mg total) by mouth 3 (three) times daily as needed for muscle spasms. (Patient taking differently: Take 5 mg by mouth as needed for muscle spasms. ), Disp: 15 tablet, Rfl: 0 .  ezetimibe (ZETIA) 10 MG tablet, Take 1 tablet (10 mg total) by mouth daily., Disp: 90 tablet, Rfl: 3 .  gabapentin (NEURONTIN) 100 MG capsule, Take 100 mg by mouth 2 (two) times daily as needed., Disp: , Rfl:  .  GINKGO BILOBA PO, Take 1 capsule by mouth daily., Disp: , Rfl:  .  metoprolol succinate (TOPROL XL) 25 MG 24 hr tablet, Take 1 tablet (25 mg total) by mouth daily., Disp: 90 tablet, Rfl: 2 .  Niacin CR 1000 MG TBCR, niacin ER 1,000 mg tablet,extended release 24 hr, Disp: , Rfl:  .  nitroGLYCERIN (NITROSTAT) 0.4 MG SL tablet, Place 1 tablet (0.4 mg total) under the tongue every 5 (five) minutes x 3 doses as needed., Disp: 25 tablet, Rfl: 11 .  Omega-3 Fatty Acids (FISH OIL) 1000 MG CAPS, 1 tab po qd , Disp: , Rfl:  .  pantoprazole (PROTONIX) 40 MG tablet, Take 1 tablet (40 mg total) by mouth 2 (two) times daily before a meal. 30 minutes. Try to reduce to 1x per day now or in 3 months (Patient taking differently: Take 40 mg by mouth 2 (two) times daily. ), Disp: 180 tablet, Rfl: 0 .  ramipril (ALTACE) 10 MG capsule, Take 1 capsule (10 mg total) by mouth daily., Disp: 90 capsule, Rfl: 3 .  rosuvastatin (CRESTOR) 40 MG tablet, Take 1 tablet (40 mg total) by mouth at bedtime., Disp: 90 tablet, Rfl: 3 .  vitamin E 100 UNIT capsule, Take 100  Units by mouth daily.  , Disp: , Rfl:  .  isosorbide mononitrate (IMDUR) 120 MG 24 hr tablet, Take 1 tablet (120 mg total) by mouth daily., Disp: 90 tablet, Rfl: 3  EXAM:  VITALS per patient if applicable:  GENERAL: alert, oriented, appears well and in no acute distress  PSYCH/NEURO: pleasant and cooperative, no  obvious depression or anxiety, speech and thought processing grossly intact  ASSESSMENT AND PLAN:  Discussed the following assessment and plan:  Essential hypertension -  Cont meds for now and monitor BP  -sch fasting labs   Coronary artery disease involving native coronary artery of native heart without angina pectoris/HLD- f/u cards Dr. [name]  -cont meds control risk factors   Prediabetes/h/o DM - Plan: Hemoglobin A1C  Vitamin D deficiency - Plan: Vitamin D (25 hydroxy)  Gastroesophageal reflux disease, esophagitis presence not specified - Plan: protonix 40 mg bid  F/u GI recent EGD negative   Cervicalgia - Plan: seeing Chiropractor Dr. Jacky Kindle for now   HM flu shotutd  - utdzostavax, prevnar, pna 23,Tdap ? If had 08/07/08 Consider Tdap in future, shingrix and hep B vaccine not immune MMR immune PSA had 03/2017 f/u with Dr. Marvetta Gibbons check at f/u  Had eye exam Dr. Kirke Corin get records Never smoker no CT chest  [place] 04/2013 abdominal aorta normal   Colonoscopy had 05/02/15 Dr. Fuller Plan sessile polyp repeat 04/2020 per report. Saw Dr. Silvio Pate 01/2018 and 08/23/2018  saw derm 05/03/17 noted SK, lentigos, rosacea, Aks face and arms f/u in 1 year [facility]. [place].   I discussed the assessment and treatment plan with the patient. The patient was    I discussed the assessment and treatment plan with the patient. The patient was provided an opportunity to ask questions and all were answered. The patient agreed with the plan and demonstrated an understanding of the instructions.   The patient was advised to call back or seek an in-person  evaluation if the symptoms worsen or if the condition fails to improve as anticipated.  Time spent 20 minutes  Delorise Jackson, MD

## 2018-11-15 NOTE — Progress Notes (Signed)
Subjective:   Kevin Webb is a 73 y.o. male who presents for Medicare Annual/Subsequent preventive examination.  Review of Systems:  No ROS.  Medicare Wellness Virtual Visit.  Visual/audio telehealth visit, UTA vital signs.   See social history for additional risk factors.   Cardiac Risk Factors include: advanced age (>19men, >75 women);male gender;hypertension;diabetes mellitus     Objective:    Vitals: There were no vitals taken for this visit.  There is no height or weight on file to calculate BMI.  Advanced Directives 11/15/2018 11/11/2017 04/28/2017 12/26/2016 12/25/2016 09/28/2016 04/18/2015  Does Patient Have a Medical Advance Directive? Yes Yes Yes Yes No Yes Yes  Type of Advance Directive Living will;Healthcare Power of [place];Living will Living will Living will;Healthcare Power of [place];Living will [place];Living will  Does patient want to make changes to medical advance directive? No - Patient declined No - Patient declined - No - Patient declined - No - Patient declined No - Patient declined  Copy of [place] in Chart? Yes - validated most recent copy scanned in chart (See row information) Yes - No - copy requested - No - copy requested No - copy requested  Would patient like information on creating a medical advance directive? - - Yes (ED - Information included in AVS) No - Patient declined - - -    Tobacco Social History   Tobacco Use  Smoking Status Never Smoker  Smokeless Tobacco Never Used     Counseling given: Not Answered   Clinical Intake:  Pre-visit preparation completed: Yes        Diabetes: Yes(Followed by pcp)  How often do you need to have someone help you when you read instructions, pamphlets, or other written materials from your doctor or pharmacy?: 1 - Never  Interpreter Needed?: No     Past Medical History:  Diagnosis Date  . Acute  cholecystitis 04/18/2013  . Adenomatous colon polyp 12/2004  . Anemia   . Blood transfusion without reported diagnosis   . BPH associated with nocturia   . CAD (coronary artery disease)    a. s/p CABG x4 in 2000 with LIMA-LAD, reverse SVG-IM, reverse SVG-acute margin and reverse SVG-RCA b. s/p BMS to SVG-RI in 2012 c. 12/2016: PCI/DES to SVG-RCA and PCI/DES to SVG-RI (Dr. [name])   . Carotid artery stenosis    mild   . Cataract   . Clotting disorder ([place])   . Diabetes mellitus without complication ([facility] [facility]) 03/00/9233   recent A1C 6.1 12/25/16 controlled with diet and exercise   . Diverticulitis 2008/2009  . GERD (gastroesophageal reflux disease)    controlled as of 04/01/17   . HTN (hypertension)   . Hx of dysplastic nevus 05/04/2018   upper back spinal   . Hyperglycemia   . Hyperlipidemia   . Impingement syndrome of left shoulder 2016  . Low testosterone    Dr [name], [place] ,[place]  . Myocardial infarction ([place])   . Osteoarthritis of right knee    With trace effusion  . Peyronie disease   . RLS (restless legs syndrome) 08/15/2013  . Sinus bradycardia   . Thrombocytopenia ([place])   . Vitamin D deficiency    Past Surgical History:  Procedure Laterality Date  . CHOLECYSTECTOMY N/A 04/21/2013   Procedure: LAPAROSCOPIC CHOLECYSTECTOMY WITH INTRAOPERATIVE CHOLANGIOGRAM;  Surgeon: Haywood Lasso, MD;  Location: [facility];  Service: General;  Laterality: N/A;  . CHOLECYSTECTOMY    .  COLONOSCOPY W/ POLYPECTOMY  2004 & 2009    X 2; Dr Fuller Plan; due 2014  . CORONARY ANGIOPLASTY    . CORONARY ARTERY BYPASS GRAFT  04/1999   4 vessels  . CORONARY STENT INTERVENTION N/A 12/28/2016   Procedure: CORONARY STENT INTERVENTION;  Surgeon: Lorretta Harp, MD;  Location: [facility];  Service: Cardiovascular;  Laterality: N/A;  . CORONARY STENT PLACEMENT  2012   Plavix  . LAPAROSCOPIC CHOLECYSTECTOMY  04/21/2013   Dr Margot Chimes; acutecholecystitis with necrosis  . LEFT HEART CATH AND CORS/GRAFTS  ANGIOGRAPHY N/A 12/28/2016   Procedure: LEFT HEART CATH AND CORS/GRAFTS ANGIOGRAPHY;  Surgeon: Lorretta Harp, MD;  Location: [facility];  Service: Cardiovascular;  Laterality: N/A;  . VASECTOMY     Family History  Problem Relation Age of Onset  . Aortic aneurysm Father 46       ? abdominal  . Heart attack Father 45  . Diabetes Other        PGaunt  . Coronary artery disease Mother        CABG in 25s  . CVA Mother        cns bleed from warfarin  . Aneurysm Mother   . Diabetes Brother   . Cholecystitis Sister   . Cancer Neg Hx   . Colon cancer Neg Hx   . Prostate cancer Neg Hx   . Esophageal cancer Neg Hx   . Stomach cancer Neg Hx   . Rectal cancer Neg Hx    Social History   Socioeconomic History  . Marital status: Married    Spouse name: Not on file  . Number of children: 1  . Years of education: Not on file  . Highest education level: Not on file  Occupational History  . Occupation:      Comment: plumber  Social Needs  . Financial resource strain: Not hard at all  . Food insecurity    Worry: Never true    Inability: Never true  . Transportation needs    Medical: No    Non-medical: No  Tobacco Use  . Smoking status: Never Smoker  . Smokeless tobacco: Never Used  Substance and Sexual Activity  . Alcohol use: Yes    Alcohol/week: 10.0 - 12.0 standard drinks    Types: 10 - 12 Cans of beer per week    Comment: socially   . Drug use: No  . Sexual activity: Not on file  Lifestyle  . Physical activity    Days per week: 7 days    Minutes per session: 60 min  . Stress: Not at all  Relationships  . Social Herbalist on phone: Not on file    Gets together: Not on file    Attends religious service: Not on file    Active member of club or organization: Not on file    Attends meetings of clubs or organizations: Not on file    Relationship status: Not on file  Other Topics Concern  . Not on file  Social History Narrative   Remarried 2014   1  son age 67 as of 03/2017     Outpatient Encounter Medications as of 11/15/2018  Medication Sig  . amLODipine (NORVASC) 5 MG tablet TAKE ONE (1) TABLET EACH DAY  . Ascorbic Acid (VITAMIN C) 1000 MG tablet Take 1,000 mg by mouth daily. Reported on 09/26/2015  . aspirin 81 MG tablet Take 81 mg by mouth daily.  . Cholecalciferol (  VITAMIN D3) 5000 UNITS CAPS Take by mouth daily.  . clopidogrel (PLAVIX) 75 MG tablet Take 1 tablet (75 mg total) by mouth daily.  Marland Kitchen co-enzyme Q-10 30 MG capsule Take 30 mg by mouth daily.   . cyclobenzaprine (FLEXERIL) 5 MG tablet Take 1 tablet (5 mg total) by mouth 3 (three) times daily as needed for muscle spasms. (Patient taking differently: Take 5 mg by mouth as needed for muscle spasms. )  . ezetimibe (ZETIA) 10 MG tablet Take 1 tablet (10 mg total) by mouth daily.  Marland Kitchen gabapentin (NEURONTIN) 100 MG capsule Take 100 mg by mouth 2 (two) times daily as needed.  Marland Kitchen GINKGO BILOBA PO Take 1 capsule by mouth daily.  . metoprolol succinate (TOPROL XL) 25 MG 24 hr tablet Take 1 tablet (25 mg total) by mouth daily.  . Niacin CR 1000 MG TBCR niacin ER 1,000 mg tablet,extended release 24 hr  . nitroGLYCERIN (NITROSTAT) 0.4 MG SL tablet Place 1 tablet (0.4 mg total) under the tongue every 5 (five) minutes x 3 doses as needed.  . Omega-3 Fatty Acids (FISH OIL) 1000 MG CAPS 1 tab po qd   . pantoprazole (PROTONIX) 40 MG tablet Take 1 tablet (40 mg total) by mouth 2 (two) times daily before a meal. 30 minutes. Try to reduce to 1x per day now or in 3 months (Patient taking differently: Take 40 mg by mouth 2 (two) times daily. )  . ramipril (ALTACE) 10 MG capsule Take 1 capsule (10 mg total) by mouth daily.  . rosuvastatin (CRESTOR) 40 MG tablet Take 1 tablet (40 mg total) by mouth at bedtime.  . vitamin E 100 UNIT capsule Take 100 Units by mouth daily.    . isosorbide mononitrate (IMDUR) 120 MG 24 hr tablet Take 1 tablet (120 mg total) by mouth daily.   No facility-administered  encounter medications on file as of 11/15/2018.     Activities of Daily Living In your present state of health, do you have any difficulty performing the following activities: 11/15/2018  Hearing? N  Vision? N  Difficulty concentrating or making decisions? N  Walking or climbing stairs? N  Dressing or bathing? N  Doing errands, shopping? N  Preparing Food and eating ? N  Using the Toilet? N  In the past six months, have you accidently leaked urine? N  Do you have problems with loss of bowel control? N  Comment Followed by GI and pcp  Managing your Medications? N  Managing your Finances? N  Housekeeping or managing your Housekeeping? N  Some recent data might be hidden    Patient Care Team: McLean-Scocuzza, Nino Glow, MD as PCP - General (Internal Medicine) Minus Breeding, MD as PCP - Cardiology (Cardiology) Minus Breeding, MD as Consulting Physician (Cardiology) Royston Cowper, MD as Consulting Physician (Urology) Ladene Artist, MD as Consulting Physician (Gastroenterology)   Assessment:   This is a routine wellness examination for [place].  I connected with patient 11/15/18 at 10:30 AM EDT by an audio enabled telemedicine application and verified that I am speaking with the correct person using two identifiers. Patient stated full name and DOB. Patient gave permission to continue with virtual visit. Patient's location was at home and Nurse's location was at [place] office.   Health Screenings  Colonoscopy - 04/2015 Glaucoma -none Hearing -demonstrates normal hearing during visit. Hemoglobin A1C - 03/2018 Cholesterol - 03/2018 Dental- visits every 6 month Vision- visits within the last 12 months.  Social  Alcohol intake -  yes      Smoking history- never    Smokers in home? none Illicit drug use? 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  Educational material provided.   Safety  Patient feels safe at home- yes Patient does have smoke detectors at home- yes Patient does wear sunscreen or protective clothing when in direct sunlight -yes Patient does wear seat belt when in a moving vehicle -yes Patient drives- yes  [id number] precautions and sickness symptoms discussed.   Activities of Daily Living Patient denies needing assistance with: driving, household chores, feeding themselves, getting from bed to chair, getting to the toilet, bathing/showering, dressing, managing money, or preparing meals.  No new identified risk were noted.    Depression Screen Patient denies losing interest in daily life, feeling hopeless, or crying easily over simple problems.   Medication-taking as directed and without issues.   Fall Screen Patient denies being afraid of falling or falling in the last year.   Memory Screen Patient is alert.  Patient denies difficulty focusing, concentrating or misplacing items. Correctly identified the president of the [place], season and recall. Patient likes to read for brain stimulation.  Immunizations The following Immunizations were discussed: Influenza, shingles, pneumonia, and tetanus.   Other Providers Patient Care Team: McLean-Scocuzza, Nino Glow, MD as PCP - General (Internal Medicine) Minus Breeding, MD as PCP - Cardiology (Cardiology) Minus Breeding, MD as Consulting Physician (Cardiology) Royston Cowper, MD as Consulting Physician (Urology) Ladene Artist, MD as Consulting Physician (Gastroenterology)  Exercise Activities and Dietary recommendations Current Exercise Habits: Home exercise routine, Type of exercise: walking;stretching;strength training/weights, Frequency (Times/Week): 7, Intensity: Mild  Goals      Patient Stated   . Increase physical activity (pt-stated)     Modify exercise regimen       Fall Risk Fall Risk  11/15/2018 12/23/2017  12/16/2017 11/11/2017 09/28/2016  Falls in the past year? 0 No No No Yes  Comment - - Emmi Telephone Survey: data to providers prior to load - -  Number falls in past yr: - - - - 1  Risk for fall due to : - - - - History of fall(s)  Follow up - - - - Education provided;Falls prevention discussed   Depression Screen PHQ 2/9 Scores 11/15/2018 12/23/2017 11/11/2017 09/28/2016  PHQ - 2 Score 0 0 0 0    Cognitive Function MMSE - Mini Mental State Exam 11/11/2017  Orientation to time 5  Orientation to Place 5  Registration 3  Attention/ Calculation 5  Recall 2  Language- name 2 objects 2  Language- repeat 1  Language- follow 3 step command 3  Language- read & follow direction 1  Write a sentence 1  Copy design 1  Total score 29     6CIT Screen 11/15/2018  What Year? 0 points  What month? 0 points  What time? 0 points  Count back from 20 0 points  Months in reverse 0 points  Repeat phrase 0 points  Total Score 0    Immunization History  Administered Date(s) Administered  . Influenza, High Dose Seasonal PF 01/25/2015, 03/30/2016, 04/01/2017, 03/29/2018  . Influenza,inj,Quad PF,6+ Mos 04/25/2014  . Influenza,inj,quad, With Preservative 02/15/2018  . Pneumococcal Conjugate-13 09/27/2015  . Pneumococcal Polysaccharide-23 04/25/2014  . Td 08/07/2008  . Zoster 03/21/2014   Screening Tests Health Maintenance  Topic Date Due  .  FOOT EXAM  12/11/1955  . OPHTHALMOLOGY EXAM  07/28/2018  . TETANUS/TDAP  08/08/2018  . HEMOGLOBIN A1C  09/27/2018  . INFLUENZA VACCINE  12/17/2018  . COLONOSCOPY  05/01/2020  . Hepatitis C Screening  Completed  . PNA vac Low Risk Adult  Completed       Plan:    End of life planning; Advance aging; Advanced directives discussed.  Copy of current HCPOA/Living Will on file.    I have personally reviewed and noted the following in the patient's chart:   . Medical and social history . Use of alcohol, tobacco or illicit drugs  . Current medications and  supplements . Functional ability and status . Nutritional status . Physical activity . Advanced directives . List of other physicians . Hospitalizations, surgeries, and ER visits in previous 12 months . Vitals . Screenings to include cognitive, depression, and falls . Referrals and appointments  In addition, I have reviewed and discussed with patient certain preventive protocols, quality metrics, and best practice recommendations. A written personalized care plan for preventive services as well as general preventive health recommendations were provided to patient.     Varney Biles, LPN  4/37/3578

## 2018-11-15 NOTE — Patient Instructions (Addendum)
  Kevin Webb , Thank you for taking time to come for your Medicare Wellness Visit. I appreciate your ongoing commitment to your health goals. Please review the following plan we discussed and let me know if I can assist you in the future.   These are the goals we discussed: Goals      Patient Stated   . Increase physical activity (pt-stated)     Modify exercise regimen       This is a list of the screening recommended for you and due dates:  Health Maintenance  Topic Date Due  . Complete foot exam   12/11/1955  . Eye exam for diabetics  07/28/2018  . Tetanus Vaccine  08/08/2018  . Hemoglobin A1C  09/27/2018  . Flu Shot  12/17/2018  . Colon Cancer Screening  05/01/2020  .  Hepatitis C: One time screening is recommended by Center for Disease Control  (CDC) for  adults born from [birth year] through 1963.   Completed  . Pneumonia vaccines  Completed

## 2018-11-15 NOTE — Telephone Encounter (Signed)
Opened in error

## 2018-11-23 ENCOUNTER — Other Ambulatory Visit: Payer: Self-pay | Admitting: Internal Medicine

## 2018-11-23 DIAGNOSIS — K219 Gastro-esophageal reflux disease without esophagitis: Secondary | ICD-10-CM

## 2018-11-23 MED ORDER — PANTOPRAZOLE SODIUM 40 MG PO TBEC: 40.0000 mg | DELAYED_RELEASE_TABLET | Freq: Two times a day (BID) | ORAL | 3 refills | Status: DC

## 2018-12-01 ENCOUNTER — Other Ambulatory Visit: Payer: Self-pay

## 2018-12-01 ENCOUNTER — Other Ambulatory Visit (INDEPENDENT_AMBULATORY_CARE_PROVIDER_SITE_OTHER): Payer: Medicare Other

## 2018-12-01 DIAGNOSIS — E559 Vitamin D deficiency, unspecified: Secondary | ICD-10-CM

## 2018-12-01 DIAGNOSIS — I10 Essential (primary) hypertension: Secondary | ICD-10-CM

## 2018-12-01 DIAGNOSIS — Z1389 Encounter for screening for other disorder: Secondary | ICD-10-CM | POA: Diagnosis not present

## 2018-12-01 DIAGNOSIS — R7303 Prediabetes: Secondary | ICD-10-CM | POA: Diagnosis not present

## 2018-12-01 DIAGNOSIS — Z125 Encounter for screening for malignant neoplasm of prostate: Secondary | ICD-10-CM

## 2018-12-01 DIAGNOSIS — I2510 Atherosclerotic heart disease of native coronary artery without angina pectoris: Secondary | ICD-10-CM | POA: Diagnosis not present

## 2018-12-01 LAB — CBC WITH DIFFERENTIAL/PLATELET
Basophils Absolute: 0 10*3/uL (ref 0.0–0.1)
Basophils Relative: 0.3 % (ref 0.0–3.0)
Eosinophils Absolute: 0 10*3/uL (ref 0.0–0.7)
Eosinophils Relative: 0.8 % (ref 0.0–5.0)
HCT: 43.1 % (ref 39.0–52.0)
Hemoglobin: 14.5 g/dL (ref 13.0–17.0)
Lymphocytes Relative: 19.5 % (ref 12.0–46.0)
Lymphs Abs: 1.1 10*3/uL (ref 0.7–4.0)
MCHC: 33.7 g/dL (ref 30.0–36.0)
MCV: 92.2 fl (ref 78.0–100.0)
Monocytes Absolute: 0.4 10*3/uL (ref 0.1–1.0)
Monocytes Relative: 8.2 % (ref 3.0–12.0)
Neutro Abs: 3.9 10*3/uL (ref 1.4–7.7)
Neutrophils Relative %: 71.2 % (ref 43.0–77.0)
Platelets: 156 10*3/uL (ref 150.0–400.0)
RBC: 4.67 Mil/uL (ref 4.22–5.81)
RDW: 14.4 % (ref 11.5–15.5)
WBC: 5.5 10*3/uL (ref 4.0–10.5)

## 2018-12-01 LAB — COMPREHENSIVE METABOLIC PANEL
ALT: 14 U/L (ref 0–53)
AST: 19 U/L (ref 0–37)
Albumin: 4.4 g/dL (ref 3.5–5.2)
Alkaline Phosphatase: 41 U/L (ref 39–117)
BUN: 13 mg/dL (ref 6–23)
CO2: 25 mEq/L (ref 19–32)
Calcium: 8.7 mg/dL (ref 8.4–10.5)
Chloride: 102 mEq/L (ref 96–112)
Creatinine, Ser: 0.83 mg/dL (ref 0.40–1.50)
GFR: 91.34 mL/min (ref >60.00)
Glucose, Bld: 103 mg/dL — ABNORMAL HIGH (ref 70–99)
Potassium: 4.1 mEq/L (ref 3.5–5.1)
Sodium: 137 mEq/L (ref 135–145)
Total Bilirubin: 1.1 mg/dL (ref 0.2–1.2)
Total Protein: 6.4 g/dL (ref 6.0–8.3)

## 2018-12-01 LAB — LIPID PANEL
Cholesterol: 143 mg/dL (ref 0–200)
HDL: 48.1 mg/dL (ref >39.00)
LDL Cholesterol: 67 mg/dL (ref 0–99)
NonHDL: 95.32
Total CHOL/HDL Ratio: 3
Triglycerides: 144 mg/dL (ref 0.0–149.0)
VLDL: 28.8 mg/dL (ref 0.0–40.0)

## 2018-12-01 LAB — HEMOGLOBIN A1C: Hgb A1c MFr Bld: 6.4 % (ref 4.6–6.5)

## 2018-12-01 LAB — VITAMIN D 25 HYDROXY (VIT D DEFICIENCY, FRACTURES): VITD: 23.25 ng/mL — ABNORMAL LOW (ref 30.00–100.00)

## 2018-12-01 LAB — PSA, MEDICARE: PSA: 1.35 ng/ml (ref 0.10–4.00)

## 2018-12-02 LAB — URINALYSIS, ROUTINE W REFLEX MICROSCOPIC
Bilirubin Urine: NEGATIVE
Glucose, UA: NEGATIVE
Hgb urine dipstick: NEGATIVE
Leukocytes,Ua: NEGATIVE
Nitrite: NEGATIVE
Protein, ur: NEGATIVE
Specific Gravity, Urine: 1.017 (ref 1.001–1.03)
pH: <=5 (ref 5.0–8.0)

## 2018-12-06 ENCOUNTER — Telehealth: Payer: Self-pay

## 2018-12-06 NOTE — Progress Notes (Signed)
Cardiology Office Note   Date:  12/07/2018   ID:  [name], [name] 30-Oct-1945, MRN [id number]  PCP:  McLean-Scocuzza, Nino Glow, MD  Cardiologist:   Minus Breeding, MD   Chief Complaint  Patient presents with  . Coronary Artery Disease      History of Present Illness: Kevin Webb is a 73 y.o. male who presents for evaluation of CAD. He was admitted from 8/10- 12/29/2016 for evaluation of chest pain with elevated troponin. Cardiac catheterization on 12/28/2016 demonstrated severe native disease but the LIMA-LAD was patent with 75% stenosis noted along the SVG-RCA and 90% stenosis along the SVG-RI. Successful PCI with DES placement was performed to both lesions. He was started on DAPT with ASA and Brilinta along with being continued on BB and statin therapy.   Since I last saw him he is done well.  He had no ER visits or hospitalizations.  He still works very hard. The patient denies any new symptoms such as chest discomfort, neck or arm discomfort. There has been no new shortness of breath, PND or orthopnea. There have been no reported palpitations, presyncope or syncope. =  Past Medical History:  Diagnosis Date  . Acute cholecystitis 04/18/2013  . Adenomatous colon polyp 12/2004  . Anemia   . Blood transfusion without reported diagnosis   . BPH associated with nocturia   . CAD (coronary artery disease)    a. s/p CABG x4 in 2000 with LIMA-LAD, reverse SVG-IM, reverse SVG-acute margin and reverse SVG-RCA b. s/p BMS to SVG-RI in 2012 c. 12/2016: PCI/DES to SVG-RCA and PCI/DES to SVG-RI (Dr. [name])   . Carotid artery stenosis    mild   . Cataract   . Clotting disorder ([place])   . Diabetes mellitus without complication ([facility]) 91/79/1505   recent A1C 6.1 12/25/16 controlled with diet and exercise   . Diverticulitis 2008/2009  . GERD (gastroesophageal reflux disease)    controlled as of 04/01/17   . HTN (hypertension)   . Hx of dysplastic nevus 05/04/2018   upper back spinal    . Hyperglycemia   . Hyperlipidemia   . Impingement syndrome of left shoulder 2016  . Low testosterone    Dr [name], [place] ,[place]  . Myocardial infarction ([place])   . Osteoarthritis of right knee    With trace effusion  . Peyronie disease   . RLS (restless legs syndrome) 08/15/2013  . Sinus bradycardia   . Thrombocytopenia ([place])   . Vitamin D deficiency     Past Surgical History:  Procedure Laterality Date  . CHOLECYSTECTOMY N/A 04/21/2013   Procedure: LAPAROSCOPIC CHOLECYSTECTOMY WITH INTRAOPERATIVE CHOLANGIOGRAM;  Surgeon: Haywood Lasso, MD;  Location: [facility];  Service: General;  Laterality: N/A;  . CHOLECYSTECTOMY    . COLONOSCOPY W/ POLYPECTOMY  2004 & 2009    X 2; Dr Fuller Plan; due 2014  . CORONARY ANGIOPLASTY    . CORONARY ARTERY BYPASS GRAFT  04/1999   4 vessels  . CORONARY STENT INTERVENTION N/A 12/28/2016   Procedure: CORONARY STENT INTERVENTION;  Surgeon: Lorretta Harp, MD;  Location: [facility];  Service: Cardiovascular;  Laterality: N/A;  . CORONARY STENT PLACEMENT  2012   Plavix  . LAPAROSCOPIC CHOLECYSTECTOMY  04/21/2013   Dr Margot Chimes; acutecholecystitis with necrosis  . LEFT HEART CATH AND CORS/GRAFTS ANGIOGRAPHY N/A 12/28/2016   Procedure: LEFT HEART CATH AND CORS/GRAFTS ANGIOGRAPHY;  Surgeon: Lorretta Harp, MD;  Location: [facility];  Service: Cardiovascular;  Laterality:  N/A;  . VASECTOMY       Current Outpatient Medications  Medication Sig Dispense Refill  . amLODipine (NORVASC) 5 MG tablet TAKE ONE (1) TABLET EACH DAY 90 tablet 3  . Ascorbic Acid (VITAMIN C) 1000 MG tablet Take 1,000 mg by mouth daily. Reported on 09/26/2015    . aspirin 81 MG tablet Take 81 mg by mouth daily.    . Cholecalciferol (VITAMIN D3) 5000 UNITS CAPS Take by mouth daily.    . clopidogrel (PLAVIX) 75 MG tablet Take 1 tablet (75 mg total) by mouth daily. 90 tablet 3  . co-enzyme Q-10 30 MG capsule Take 30 mg by mouth daily.     . cyclobenzaprine (FLEXERIL) 5 MG  tablet Take 1 tablet (5 mg total) by mouth 3 (three) times daily as needed for muscle spasms. (Patient taking differently: Take 5 mg by mouth as needed for muscle spasms. ) 15 tablet 0  . ezetimibe (ZETIA) 10 MG tablet Take 1 tablet (10 mg total) by mouth daily. 90 tablet 3  . gabapentin (NEURONTIN) 100 MG capsule Take 100 mg by mouth 2 (two) times daily as needed.    Marland Kitchen GINKGO BILOBA PO Take 1 capsule by mouth daily.    . metoprolol succinate (TOPROL XL) 25 MG 24 hr tablet Take 1 tablet (25 mg total) by mouth daily. 90 tablet 2  . Niacin CR 1000 MG TBCR niacin ER 1,000 mg tablet,extended release 24 hr    . nitroGLYCERIN (NITROSTAT) 0.4 MG SL tablet Place 1 tablet (0.4 mg total) under the tongue every 5 (five) minutes x 3 doses as needed. 25 tablet 4  . Omega-3 Fatty Acids (FISH OIL) 1000 MG CAPS 1 tab po qd     . pantoprazole (PROTONIX) 40 MG tablet Take 1 tablet (40 mg total) by mouth 2 (two) times daily before a meal. 30 minutes. Try to reduce to 1x per day now or in 3 months 180 tablet 3  . ramipril (ALTACE) 10 MG capsule Take 1 capsule (10 mg total) by mouth daily. 90 capsule 3  . rosuvastatin (CRESTOR) 40 MG tablet Take 1 tablet (40 mg total) by mouth at bedtime. 90 tablet 3  . vitamin E 100 UNIT capsule Take 100 Units by mouth daily.      . isosorbide mononitrate (IMDUR) 120 MG 24 hr tablet Take 1 tablet (120 mg total) by mouth daily. 90 tablet 3   No current facility-administered medications for this visit.     Allergies:   Darvocet [propoxyphene n-acetaminophen] and Propoxyphene    ROS:  Please see the history of present illness.   Otherwise, review of systems are positive for none.   All other systems are reviewed and negative.    PHYSICAL EXAM: VS:  BP 122/71   Pulse 64   Temp 97.7 F (36.5 C) (Temporal)   Ht 5\' 8"  (1.727 m)   Wt 210 lb 12.8 oz (95.6 kg)   SpO2 97%   BMI 32.05 kg/m  , BMI Body mass index is 32.05 kg/m. GENERAL:  Well appearing NECK:  No jugular venous  distention, waveform within normal limits, carotid upstroke brisk and symmetric, no bruits, no thyromegaly LUNGS:  Clear to auscultation bilaterally CHEST:  Well healed sternotomy scar. HEART:  PMI not displaced or sustained,S1 and S2 within normal limits, no S3, no S4, no clicks, no rubs, no murmurs ABD:  Flat, positive bowel sounds normal in frequency in pitch, no bruits, no rebound, no guarding, no midline pulsatile mass,  no hepatomegaly, no splenomegaly EXT:  2 plus pulses throughout, no edema, no cyanosis no clubbing   EKG:  EKG is not ordered today.   Recent Labs: 03/29/2018: TSH 1.75 12/01/2018: ALT 14; BUN 13; Creatinine, Ser 0.83; Hemoglobin 14.5; Platelets 156.0; Potassium 4.1; Sodium 137   Lab Results  Component Value Date   HGBA1C 6.4 12/01/2018     Lipid Panel    Component Value Date/Time   CHOL 143 12/01/2018 0941   TRIG 144.0 12/01/2018 0941   HDL 48.10 12/01/2018 0941   CHOLHDL 3 12/01/2018 0941   VLDL 28.8 12/01/2018 0941   LDLCALC 67 12/01/2018 0941   LDLDIRECT 91.0 03/30/2016 1144      Wt Readings from Last 3 Encounters:  12/07/18 210 lb 12.8 oz (95.6 kg)  10/06/18 195 lb (88.5 kg)  10/03/18 200 lb (90.7 kg)      Other studies Reviewed: Additional studies/ records that were reviewed today include: Labs. Review of the above records demonstrates:  Please see elsewhere in the note.     ASSESSMENT AND PLAN:   CAD/CABG-  The patient has no new sypmtoms.  No further cardiovascular testing is indicated.  We will continue with aggressive risk reduction and meds as listed.   HYPERLIPIDEMIA -  LDL is 67 last week with an HDL of 48.  He will continue on meds as listed.   HYPERTENSION -  Blood pressures well controlled.  He will continue on meds as listed.   DM  A1c was 6.4.  He will continue on the meds as listed.   CAROTID DISEASE This was mild bilaterally last year I will follow this up in a couple of years.  Current medicines are reviewed  at length with the patient today.  The patient does not have concerns regarding medicines.  The following changes have been made:  no change  Labs/ tests ordered today include: None No orders of the defined types were placed in this encounter.    Disposition:   FU with me in one year.     Signed, Minus Breeding, MD  12/07/2018 2:40 PM    [facility]

## 2018-12-06 NOTE — Telephone Encounter (Signed)
7-22@120  JH    COVID-19 Pre-Screening Questions:  . In the past 7 to 10 days have you had a cough,  shortness of breath, headache, congestion, fever (100 or greater) body aches, chills, sore throat, or sudden loss of taste or sense of smell? NO . Have you been around anyone with known Covid 19. NO . Have you been around anyone who is awaiting Covid 19 test results in the past 7 to 10 days? NO . Have you been around anyone who has been exposed to Covid 19, or has mentioned symptoms of Covid 19 within the past 7 to 10 days? NO  PT WILL ARRIVE EARLY W/MASK AND NO VISITORS

## 2018-12-07 ENCOUNTER — Encounter: Payer: Self-pay | Admitting: Cardiology

## 2018-12-07 ENCOUNTER — Other Ambulatory Visit: Payer: Self-pay

## 2018-12-07 ENCOUNTER — Ambulatory Visit (INDEPENDENT_AMBULATORY_CARE_PROVIDER_SITE_OTHER): Payer: Medicare Other | Admitting: Cardiology

## 2018-12-07 VITALS — BP 122/71 | HR 64 | Temp 97.7°F | Ht 68.0 in | Wt 210.8 lb

## 2018-12-07 DIAGNOSIS — I2510 Atherosclerotic heart disease of native coronary artery without angina pectoris: Secondary | ICD-10-CM

## 2018-12-07 DIAGNOSIS — E785 Hyperlipidemia, unspecified: Secondary | ICD-10-CM

## 2018-12-07 DIAGNOSIS — I10 Essential (primary) hypertension: Secondary | ICD-10-CM | POA: Diagnosis not present

## 2018-12-07 MED ORDER — NITROGLYCERIN 0.4 MG SL SUBL: 0.4000 mg | SUBLINGUAL_TABLET | SUBLINGUAL | 4 refills | Status: DC | PRN

## 2018-12-07 NOTE — Patient Instructions (Signed)
Follow-Up: You will need a follow up appointment in 12 months.  Please call our office 2 months in advance to schedule this appointment.  You may see James Hochrein, MD or one of the following Advanced Practice Providers on your designated Care Team:  Rhonda Barrett, PA-C  Kathryn Lawrence, DNP, ANP       Medication Instructions:  The current medical regimen is effective;  continue present plan and medications as directed. Please refer to the Current Medication list given to you today. If you need a refill on your cardiac medications before your next appointment, please call your pharmacy. Labwork: When you have labs (blood work) and your tests are completely normal, you will receive your results ONLY by MyChart Message (if you have MyChart) -OR- A paper copy in the mail.  At [facility], you and your health needs are our priority.  As part of our continuing mission to provide you with exceptional heart care, we have created designated Provider Care Teams.  These Care Teams include your primary Cardiologist (physician) and Advanced Practice Providers (APPs -  Physician Assistants and Nurse Practitioners) who all work together to provide you with the care you need, when you need it.  Thank you for choosing [facility]!!       

## 2018-12-28 ENCOUNTER — Other Ambulatory Visit: Payer: Self-pay | Admitting: Internal Medicine

## 2018-12-28 MED ORDER — METOPROLOL SUCCINATE ER 25 MG PO TB24: 25.0000 mg | ORAL_TABLET | Freq: Every day | ORAL | 3 refills | Status: DC

## 2019-01-04 DIAGNOSIS — N486 Induration penis plastica: Secondary | ICD-10-CM | POA: Diagnosis not present

## 2019-01-04 DIAGNOSIS — E291 Testicular hypofunction: Secondary | ICD-10-CM | POA: Diagnosis not present

## 2019-01-04 DIAGNOSIS — N5201 Erectile dysfunction due to arterial insufficiency: Secondary | ICD-10-CM | POA: Diagnosis not present

## 2019-01-04 DIAGNOSIS — Z79899 Other long term (current) drug therapy: Secondary | ICD-10-CM | POA: Diagnosis not present

## 2019-01-04 DIAGNOSIS — N401 Enlarged prostate with lower urinary tract symptoms: Secondary | ICD-10-CM | POA: Diagnosis not present

## 2019-01-10 ENCOUNTER — Other Ambulatory Visit: Payer: Self-pay | Admitting: Internal Medicine

## 2019-01-10 DIAGNOSIS — I10 Essential (primary) hypertension: Secondary | ICD-10-CM

## 2019-01-10 MED ORDER — AMLODIPINE BESYLATE 5 MG PO TABS: ORAL_TABLET | ORAL | 3 refills | Status: DC

## 2019-02-03 ENCOUNTER — Ambulatory Visit: Payer: Medicare Other | Admitting: Internal Medicine

## 2019-02-03 DIAGNOSIS — B078 Other viral warts: Secondary | ICD-10-CM | POA: Diagnosis not present

## 2019-02-03 DIAGNOSIS — L814 Other melanin hyperpigmentation: Secondary | ICD-10-CM | POA: Diagnosis not present

## 2019-02-15 ENCOUNTER — Ambulatory Visit (INDEPENDENT_AMBULATORY_CARE_PROVIDER_SITE_OTHER): Payer: Medicare Other | Admitting: Internal Medicine

## 2019-02-15 ENCOUNTER — Encounter: Payer: Self-pay | Admitting: Internal Medicine

## 2019-02-15 ENCOUNTER — Other Ambulatory Visit: Payer: Self-pay

## 2019-02-15 VITALS — Ht 68.0 in | Wt 210.8 lb

## 2019-02-15 DIAGNOSIS — E785 Hyperlipidemia, unspecified: Secondary | ICD-10-CM

## 2019-02-15 DIAGNOSIS — M545 Low back pain, unspecified: Secondary | ICD-10-CM

## 2019-02-15 DIAGNOSIS — K219 Gastro-esophageal reflux disease without esophagitis: Secondary | ICD-10-CM

## 2019-02-15 DIAGNOSIS — M542 Cervicalgia: Secondary | ICD-10-CM

## 2019-02-15 DIAGNOSIS — E559 Vitamin D deficiency, unspecified: Secondary | ICD-10-CM

## 2019-02-15 DIAGNOSIS — I10 Essential (primary) hypertension: Secondary | ICD-10-CM

## 2019-02-15 HISTORY — DX: Low back pain, unspecified: M54.50

## 2019-02-15 MED ORDER — SUCRALFATE 1 G PO TABS: 1.0000 g | ORAL_TABLET | Freq: Three times a day (TID) | ORAL | 11 refills | Status: DC

## 2019-02-15 NOTE — Progress Notes (Addendum)
Virtual Visit via Video Note  I connected with Kevin Webb  on 02/15/19 at  9:30 AM EDT by a video enabled telemedicine application and verified that I am speaking with the correct person using two identifiers.  Location patient: home Location provider:work or home office Persons participating in the virtual visit: patient, provider  I discussed the limitations of evaluation and management by telemedicine and the availability of in person appointments. The patient expressed understanding and agreed to proceed.   HPI: 1. HTN not checked BP today but when checking 120s-130s/60-70s on meds altace 10 mg qd toprol 25 mg qd norvasc 5  2. DM 2 now prediabetes A1C 6.4  3. GERD uncontrolled on protonix 40 mg bid and tums multiple x per day 09/2018 EGD neg bxs with GI  4. Neck, low back pain and hamstring issue improved with stretching  5. Vit D def taking 5000 IU qd then increased to 10K IU daily   ROS: See pertinent positives and negatives per HPI.  Past Medical History:  Diagnosis Date  . Acute cholecystitis 04/18/2013  . Adenomatous colon polyp 12/2004  . Anemia   . Blood transfusion without reported diagnosis   . BPH associated with nocturia   . CAD (coronary artery disease)    a. s/p CABG x4 in 2000 with LIMA-LAD, reverse SVG-IM, reverse SVG-acute margin and reverse SVG-RCA b. s/p BMS to SVG-RI in 2012 c. 12/2016: PCI/DES to SVG-RCA and PCI/DES to SVG-RI (Dr. [name])   . Carotid artery stenosis    mild   . Cataract   . Clotting disorder ([place])   . Diabetes mellitus without complication ([place]) 67/61/9509   recent A1C 6.1 12/25/16 controlled with diet and exercise   . Diverticulitis 2008/2009  . GERD (gastroesophageal reflux disease)    controlled as of 04/01/17   . HTN (hypertension)   . Hx of dysplastic nevus 05/04/2018   upper back spinal   . Hyperglycemia   . Hyperlipidemia   . Impingement syndrome of left shoulder 2016  . Low testosterone    Dr [name], [place] ,[place]  .  Myocardial infarction ([place])   . Osteoarthritis of right knee    With trace effusion  . Peyronie disease   . RLS (restless legs syndrome) 08/15/2013  . Sinus bradycardia   . Thrombocytopenia ([place])   . Vitamin D deficiency     Past Surgical History:  Procedure Laterality Date  . CHOLECYSTECTOMY N/A 04/21/2013   Procedure: LAPAROSCOPIC CHOLECYSTECTOMY WITH INTRAOPERATIVE CHOLANGIOGRAM;  Surgeon: Haywood Lasso, MD;  Location: [facility];  Service: General;  Laterality: N/A;  . CHOLECYSTECTOMY    . COLONOSCOPY W/ POLYPECTOMY  2004 & 2009    X 2; Dr Fuller Plan; due 2014  . CORONARY ANGIOPLASTY    . CORONARY ARTERY BYPASS GRAFT  04/1999   4 vessels  . CORONARY STENT INTERVENTION N/A 12/28/2016   Procedure: CORONARY STENT INTERVENTION;  Surgeon: Lorretta Harp, MD;  Location: [facility];  Service: Cardiovascular;  Laterality: N/A;  . CORONARY STENT PLACEMENT  2012   Plavix  . LAPAROSCOPIC CHOLECYSTECTOMY  04/21/2013   Dr Margot Chimes; acutecholecystitis with necrosis  . LEFT HEART CATH AND CORS/GRAFTS ANGIOGRAPHY N/A 12/28/2016   Procedure: LEFT HEART CATH AND CORS/GRAFTS ANGIOGRAPHY;  Surgeon: Lorretta Harp, MD;  Location: [facility];  Service: Cardiovascular;  Laterality: N/A;  . VASECTOMY      Family History  Problem Relation Age of Onset  . Aortic aneurysm Father 50       ?  abdominal  . Heart attack Father 48  . Diabetes Other        PGaunt  . Coronary artery disease Mother        CABG in 60s  . CVA Mother        cns bleed from warfarin  . Aneurysm Mother   . Diabetes Brother   . Cholecystitis Sister   . Cancer Neg Hx   . Colon cancer Neg Hx   . Prostate cancer Neg Hx   . Esophageal cancer Neg Hx   . Stomach cancer Neg Hx   . Rectal cancer Neg Hx     SOCIAL HX:   Remarried 2014 1 son age 48 as of 03/2017   Current Outpatient Medications:  .  amLODipine (NORVASC) 5 MG tablet, TAKE ONE (1) TABLET EACH DAY, Disp: 90 tablet, Rfl: 3 .  Ascorbic Acid (VITAMIN  C) 1000 MG tablet, Take 1,000 mg by mouth daily. Reported on 09/26/2015, Disp: , Rfl:  .  aspirin 81 MG tablet, Take 81 mg by mouth daily., Disp: , Rfl:  .  Cholecalciferol (VITAMIN D3) 5000 UNITS CAPS, Take by mouth daily., Disp: , Rfl:  .  clopidogrel (PLAVIX) 75 MG tablet, Take 1 tablet (75 mg total) by mouth daily., Disp: 90 tablet, Rfl: 3 .  co-enzyme Q-10 30 MG capsule, Take 30 mg by mouth daily. , Disp: , Rfl:  .  cyclobenzaprine (FLEXERIL) 5 MG tablet, Take 1 tablet (5 mg total) by mouth 3 (three) times daily as needed for muscle spasms. (Patient taking differently: Take 5 mg by mouth as needed for muscle spasms. ), Disp: 15 tablet, Rfl: 0 .  ezetimibe (ZETIA) 10 MG tablet, Take 1 tablet (10 mg total) by mouth daily., Disp: 90 tablet, Rfl: 3 .  gabapentin (NEURONTIN) 100 MG capsule, Take 100 mg by mouth 2 (two) times daily as needed., Disp: , Rfl:  .  GINKGO BILOBA PO, Take 1 capsule by mouth daily., Disp: , Rfl:  .  metoprolol succinate (TOPROL XL) 25 MG 24 hr tablet, Take 1 tablet (25 mg total) by mouth daily., Disp: 90 tablet, Rfl: 3 .  Niacin CR 1000 MG TBCR, niacin ER 1,000 mg tablet,extended release 24 hr, Disp: , Rfl:  .  nitroGLYCERIN (NITROSTAT) 0.4 MG SL tablet, Place 1 tablet (0.4 mg total) under the tongue every 5 (five) minutes x 3 doses as needed., Disp: 25 tablet, Rfl: 4 .  Omega-3 Fatty Acids (FISH OIL) 1000 MG CAPS, 1 tab po qd , Disp: , Rfl:  .  pantoprazole (PROTONIX) 40 MG tablet, Take 1 tablet (40 mg total) by mouth 2 (two) times daily before a meal. 30 minutes. Try to reduce to 1x per day now or in 3 months, Disp: 180 tablet, Rfl: 3 .  ramipril (ALTACE) 10 MG capsule, Take 1 capsule (10 mg total) by mouth daily., Disp: 90 capsule, Rfl: 3 .  rosuvastatin (CRESTOR) 40 MG tablet, Take 1 tablet (40 mg total) by mouth at bedtime., Disp: 90 tablet, Rfl: 3 .  vitamin E 100 UNIT capsule, Take 100 Units by mouth daily.  , Disp: , Rfl:  .  isosorbide mononitrate (IMDUR) 120 MG 24  hr tablet, Take 1 tablet (120 mg total) by mouth daily., Disp: 90 tablet, Rfl: 3 .  sucralfate (CARAFATE) 1 g tablet, Take 1 tablet (1 g total) by mouth 4 (four) times daily -  with meals and at bedtime., Disp: 120 tablet, Rfl: 11  EXAM:  VITALS per patient   if applicable:  GENERAL: alert, oriented, appears well and in no acute distress  HEENT: atraumatic, conjunttiva clear, no obvious abnormalities on inspection of external nose and ears  NECK: normal movements of the head and neck  LUNGS: on inspection no signs of respiratory distress, breathing rate appears normal, no obvious gross SOB, gasping or wheezing  CV: no obvious cyanosis  MS: moves all visible extremities without noticeable abnormality  PSYCH/NEURO: pleasant and cooperative, no obvious depression or anxiety, speech and thought processing grossly intact  ASSESSMENT AND PLAN:  Discussed the following assessment and plan:  Essential hypertension/HLD -cont monitor BP cont meds  -rec healthy diet and exercise and f/u cards  Prediabetes  rec healthy diet and exercise   Gastroesophageal reflux disease, esophagitis presence not specified - Plan: sucralfate (CARAFATE) 1 g tablet qid with protonix 40 mg bid   Cervicalgia Low back pain, unspecified back pain laterality, unspecified chronicity, unspecified whether sciatica present Hamstring issues  -continue stretching better for now   Vit D def  rec D3 10K 5/7 days per week only   HM flu shotutd -call to schedule flu shot here  - utdzostavax, prevnar, pna 23,Tdap ? If had 08/07/08 Rx  Tdap and shingrix mailed 02/15/2019  hep B vaccine not immune MMR immune PSA had 12/01/18 1.35 normal  -Dr. Wolfe  Had eye exam Dr. Bell~11/2017 get records Never smoker no CT chest  US 04/2013 abdominal aorta normal   Colonoscopy had 05/02/15 Dr. Stark sessile polyp repeat 04/2020 per report. Saw Dr. Starks 9/2019and 08/23/2018 saw derm 05/03/17 noted SK, lentigos,  rosacea, Aks face and arms f/u in 1 year [facility]. [place].   -we discussed possible serious and likely etiologies, options for evaluation and workup, limitations of telemedicine visit vs in person visit, treatment, treatment risks and precautions. Pt prefers to treat via telemedicine empirically rather then risking or undertaking an in person visit at this moment. Patient agrees to seek prompt in person care if worsening, new symptoms arise, or if is not improving with treatment.   I discussed the assessment and treatment plan with the patient. The patient was provided an opportunity to ask questions and all were answered. The patient agreed with the plan and demonstrated an understanding of the instructions.   The patient was advised to call back or seek an in-person evaluation if the symptoms worsen or if the condition fails to improve as anticipated.  Time spent 15 minutes  Tracy N McLean-Scocuzza, MD  

## 2019-02-23 ENCOUNTER — Ambulatory Visit: Payer: Medicare Other | Admitting: Internal Medicine

## 2019-03-23 ENCOUNTER — Other Ambulatory Visit: Payer: Self-pay | Admitting: Cardiology

## 2019-03-29 DIAGNOSIS — Z23 Encounter for immunization: Secondary | ICD-10-CM | POA: Diagnosis not present

## 2019-04-10 ENCOUNTER — Telehealth: Payer: Self-pay | Admitting: Internal Medicine

## 2019-04-10 ENCOUNTER — Other Ambulatory Visit: Payer: Self-pay | Admitting: Internal Medicine

## 2019-04-10 ENCOUNTER — Telehealth: Payer: Self-pay

## 2019-04-10 DIAGNOSIS — K5792 Diverticulitis of intestine, part unspecified, without perforation or abscess without bleeding: Secondary | ICD-10-CM

## 2019-04-10 MED ORDER — METRONIDAZOLE 500 MG PO TABS: 500.0000 mg | ORAL_TABLET | Freq: Three times a day (TID) | ORAL | 0 refills | Status: DC

## 2019-04-10 MED ORDER — CIPROFLOXACIN HCL 500 MG PO TABS: 500.0000 mg | ORAL_TABLET | Freq: Two times a day (BID) | ORAL | 0 refills | Status: DC

## 2019-04-10 NOTE — Telephone Encounter (Signed)
Patient would like to have a round of medication for his diverticulitis. He has taken Three doses of metronidazole, but he has ran out. It started helping, but he is out. [facility] is [facility].

## 2019-04-10 NOTE — Telephone Encounter (Signed)
Pt called to speak Kevin Webb or he wanted Dr. Olivia Mackie to give him a call first thing in the morning/ please advise

## 2019-04-10 NOTE — Telephone Encounter (Signed)
Inform patient  He needs to call GI for follow up as well  Sent   [place]

## 2019-04-10 NOTE — Telephone Encounter (Signed)
Pt called about having a Divirticulitis outbreak. Pt states he was given pills for Metronidazole 500mg  that was prescribed at a walk in clinic a year ago. Pt took the last of them and needs a Rx. Please advise and Thank you!  Pharmacy is [facility], [place]  Call pt @ [phone].

## 2019-04-10 NOTE — Telephone Encounter (Signed)
If requesting meds I.e antibiotics for suspected diverticulitis he needs an appt for future reference  Also needs to f/u with GI Dr. Silvio Pate  [facility]

## 2019-04-10 NOTE — Telephone Encounter (Signed)
Copied from [id number]. Topic: General - Other >> Apr 10, 2019 10:16 AM Rainey Pines A wrote: Patient would like to speak  with Dr. Dannial [facility] nurse in regards to his diverticulitis medication. Best contact [phone]

## 2019-04-10 NOTE — Telephone Encounter (Signed)
duplicate message closing note. Other note sent to PCP already after discussing issues.

## 2019-04-10 NOTE — Telephone Encounter (Signed)
I called and triaged the patient and informed him that sue to staff shortage and no open slots he would need to be evaluated by Urgent care, patient understood and will go to urgent care.  Nina,cma

## 2019-04-11 ENCOUNTER — Other Ambulatory Visit: Payer: Self-pay | Admitting: Internal Medicine

## 2019-04-11 DIAGNOSIS — I10 Essential (primary) hypertension: Secondary | ICD-10-CM

## 2019-04-11 MED ORDER — RAMIPRIL 10 MG PO CAPS: 10.0000 mg | ORAL_CAPSULE | Freq: Every day | ORAL | 3 refills | Status: DC

## 2019-04-11 NOTE — Telephone Encounter (Signed)
Patient was informed.  Patient understood and no questions, comments, or concerns at this time.  

## 2019-04-20 DIAGNOSIS — N401 Enlarged prostate with lower urinary tract symptoms: Secondary | ICD-10-CM | POA: Diagnosis not present

## 2019-04-20 DIAGNOSIS — E291 Testicular hypofunction: Secondary | ICD-10-CM | POA: Diagnosis not present

## 2019-04-20 DIAGNOSIS — N23 Unspecified renal colic: Secondary | ICD-10-CM | POA: Diagnosis not present

## 2019-04-20 DIAGNOSIS — M5489 Other dorsalgia: Secondary | ICD-10-CM | POA: Diagnosis not present

## 2019-04-25 ENCOUNTER — Other Ambulatory Visit (HOSPITAL_COMMUNITY): Payer: Self-pay | Admitting: Urology

## 2019-04-25 ENCOUNTER — Other Ambulatory Visit: Payer: Self-pay | Admitting: Urology

## 2019-04-25 DIAGNOSIS — R109 Unspecified abdominal pain: Secondary | ICD-10-CM

## 2019-04-27 DIAGNOSIS — Z20828 Contact with and (suspected) exposure to other viral communicable diseases: Secondary | ICD-10-CM | POA: Diagnosis not present

## 2019-05-09 ENCOUNTER — Other Ambulatory Visit: Payer: Self-pay

## 2019-05-09 ENCOUNTER — Ambulatory Visit
Admission: RE | Admit: 2019-05-09 | Discharge: 2019-05-09 | Disposition: A | Discharge status: Home / Self Care | Payer: Medicare Other | Source: Ambulatory Visit | Attending: Urology | Admitting: Urology

## 2019-05-09 DIAGNOSIS — R109 Unspecified abdominal pain: Secondary | ICD-10-CM | POA: Insufficient documentation

## 2019-05-09 LAB — POCT I-STAT CREATININE: Creatinine, Ser: 0.9 mg/dL (ref 0.61–1.24)

## 2019-05-09 MED ORDER — IOHEXOL 300 MG/ML  SOLN
100.0000 mL | Freq: Once | INTRAMUSCULAR | Status: AC | PRN
  Administered 2019-05-09: 100 mL via INTRAVENOUS

## 2019-05-17 ENCOUNTER — Other Ambulatory Visit: Payer: Self-pay | Admitting: Internal Medicine

## 2019-05-17 DIAGNOSIS — E119 Type 2 diabetes mellitus without complications: Secondary | ICD-10-CM

## 2019-05-17 DIAGNOSIS — E785 Hyperlipidemia, unspecified: Secondary | ICD-10-CM

## 2019-05-17 MED ORDER — EZETIMIBE 10 MG PO TABS: 10.0000 mg | ORAL_TABLET | Freq: Every day | ORAL | 3 refills | Status: DC

## 2019-05-17 MED ORDER — ROSUVASTATIN CALCIUM 40 MG PO TABS: 40.0000 mg | ORAL_TABLET | Freq: Every day | ORAL | 3 refills | Status: DC

## 2019-06-20 ENCOUNTER — Other Ambulatory Visit: Payer: Self-pay

## 2019-06-20 ENCOUNTER — Ambulatory Visit (INDEPENDENT_AMBULATORY_CARE_PROVIDER_SITE_OTHER): Payer: Medicare Other | Admitting: Internal Medicine

## 2019-06-20 ENCOUNTER — Encounter: Payer: Self-pay | Admitting: Internal Medicine

## 2019-06-20 VITALS — BP 122/76 | Ht 69.0 in | Wt 204.0 lb

## 2019-06-20 DIAGNOSIS — Z1329 Encounter for screening for other suspected endocrine disorder: Secondary | ICD-10-CM | POA: Diagnosis not present

## 2019-06-20 DIAGNOSIS — E785 Hyperlipidemia, unspecified: Secondary | ICD-10-CM | POA: Diagnosis not present

## 2019-06-20 DIAGNOSIS — I25810 Atherosclerosis of coronary artery bypass graft(s) without angina pectoris: Secondary | ICD-10-CM | POA: Diagnosis not present

## 2019-06-20 DIAGNOSIS — R7303 Prediabetes: Secondary | ICD-10-CM

## 2019-06-20 DIAGNOSIS — I700 Atherosclerosis of aorta: Secondary | ICD-10-CM | POA: Diagnosis not present

## 2019-06-20 DIAGNOSIS — J069 Acute upper respiratory infection, unspecified: Secondary | ICD-10-CM

## 2019-06-20 DIAGNOSIS — I10 Essential (primary) hypertension: Secondary | ICD-10-CM

## 2019-06-20 DIAGNOSIS — N281 Cyst of kidney, acquired: Secondary | ICD-10-CM

## 2019-06-20 DIAGNOSIS — K5790 Diverticulosis of intestine, part unspecified, without perforation or abscess without bleeding: Secondary | ICD-10-CM | POA: Diagnosis not present

## 2019-06-20 DIAGNOSIS — E559 Vitamin D deficiency, unspecified: Secondary | ICD-10-CM | POA: Diagnosis not present

## 2019-06-20 HISTORY — DX: Cyst of kidney, acquired: N28.1

## 2019-06-20 NOTE — Patient Instructions (Signed)
I recommend you get tested for covid 19 and stay home until results are back  If + stay home x 14 days and stay to yourself and away from others please    Doses for covid 19 + patients of particular vitamins while + for covid 19 Zinc 100 mg daily  Vitamin C 1000 mg daily  Vitamin D3 5000 IU daily  Quercetin 250-500 mg 2x per day   COVID-19 Vaccine Information can be found at: ShippingSc[employer].co.uk For questions related to vaccine distribution or appointments, please email vaccine@[email or web address] .com or call [phone].   We will have our office call you to schedule fasting labs

## 2019-06-20 NOTE — Progress Notes (Signed)
Virtual Visit via Video Note  I connected with Kevin Webb  on 06/20/19 at  1:42 PM EST by a video enabled telemedicine application and verified that I am speaking with the correct person using two identifiers.  Location patient: work Environmental manager or home office Persons participating in the virtual visit: patient, provider  I discussed the limitations of evaluation and management by telemedicine and the availability of in person appointments. The patient expressed understanding and agreed to proceed.   HPI: 1. C/o URI/head cold sxs started 06/14/19 and went home Friday 06/16/19 from work taste and smell ok, denies fever. He is only taking supplements vitamin D3 reports doing 10K, 10K, 15 K, then 10K alternating 15K and 10 K IU qd otc and taking vitamin C,zinc  I rec he get covid 19 tested as several of coworkers over the last months have tested + and he is possibly agreeable to this  2. Pt wanted to review CT abdomen pelvis 04/2019 ordered urology +aortic atherosclerosis, kidney cysts will CC cards to make sure they know as well but reviewed with pt control BP, HLD and sugar  3. BP controlled today122/76 on norvasc 5 mg qd, toprol xl 25 mg qd, altace 10 mg qd   ROS: See pertinent positives and negatives per HPI.  Past Medical History:  Diagnosis Date  . Acute cholecystitis 04/18/2013  . Adenomatous colon polyp 12/2004  . Anemia   . Blood transfusion without reported diagnosis   . BPH associated with nocturia   . CAD (coronary artery disease)    a. s/p CABG x4 in 2000 with LIMA-LAD, reverse SVG-IM, reverse SVG-acute margin and reverse SVG-RCA b. s/p BMS to SVG-RI in 2012 c. 12/2016: PCI/DES to SVG-RCA and PCI/DES to SVG-RI (Dr. [name])   . Carotid artery stenosis    mild   . Cataract   . Clotting disorder ([place])   . Diabetes mellitus without complication ([place]) 23/30/0762   recent A1C 6.1 12/25/16 controlled with diet and exercise   . Diverticulitis 2008/2009  . GERD  (gastroesophageal reflux disease)    controlled as of 04/01/17   . HSV infection   . HTN (hypertension)   . Hx of dysplastic nevus 05/04/2018   upper back spinal   . Hyperglycemia   . Hyperlipidemia   . Impingement syndrome of left shoulder 2016  . Low testosterone    Dr [name], [place] ,[place]  . Myocardial infarction ([place])   . Osteoarthritis of right knee    With trace effusion  . Peyronie disease   . RLS (restless legs syndrome) 08/15/2013  . Sinus bradycardia   . Thrombocytopenia ([place])   . Vitamin D deficiency     Past Surgical History:  Procedure Laterality Date  . CHOLECYSTECTOMY N/A 04/21/2013   Procedure: LAPAROSCOPIC CHOLECYSTECTOMY WITH INTRAOPERATIVE CHOLANGIOGRAM;  Surgeon: Haywood Lasso, MD;  Location: [facility];  Service: General;  Laterality: N/A;  . CHOLECYSTECTOMY    . COLONOSCOPY W/ POLYPECTOMY  2004 & 2009    X 2; Dr Fuller Plan; due 2014  . CORONARY ANGIOPLASTY    . CORONARY ARTERY BYPASS GRAFT  04/1999   4 vessels  . CORONARY STENT INTERVENTION N/A 12/28/2016   Procedure: CORONARY STENT INTERVENTION;  Surgeon: Lorretta Harp, MD;  Location: [facility];  Service: Cardiovascular;  Laterality: N/A;  . CORONARY STENT PLACEMENT  2012   Plavix  . LAPAROSCOPIC CHOLECYSTECTOMY  04/21/2013   Dr Margot Chimes; acutecholecystitis with necrosis  . LEFT HEART CATH AND CORS/GRAFTS ANGIOGRAPHY N/A  12/28/2016   Procedure: LEFT HEART CATH AND CORS/GRAFTS ANGIOGRAPHY;  Surgeon: Lorretta Harp, MD;  Location: [facility];  Service: Cardiovascular;  Laterality: N/A;  . VASECTOMY      Family History  Problem Relation Age of Onset  . Aortic aneurysm Father 71       ? abdominal  . Heart attack Father 23  . Diabetes Other        PGaunt  . Coronary artery disease Mother        CABG in 30s  . CVA Mother        cns bleed from warfarin  . Aneurysm Mother   . Diabetes Brother   . Cholecystitis Sister   . Cancer Neg Hx   . Colon cancer Neg Hx   . Prostate cancer Neg  Hx   . Esophageal cancer Neg Hx   . Stomach cancer Neg Hx   . Rectal cancer Neg Hx     SOCIAL HX:  Remarried 2014 1 son age 37 as of 03/2017    Current Outpatient Medications:  .  amLODipine (NORVASC) 5 MG tablet, TAKE ONE (1) TABLET EACH DAY, Disp: 90 tablet, Rfl: 3 .  Ascorbic Acid (VITAMIN C) 1000 MG tablet, Take 1,000 mg by mouth daily. Reported on 09/26/2015, Disp: , Rfl:  .  aspirin 81 MG tablet, Take 81 mg by mouth daily., Disp: , Rfl:  .  Cholecalciferol (VITAMIN D3) 5000 UNITS CAPS, Take by mouth daily. Alternates 10K IU and 15 K IU D3 was on 5K IU qd and vitamin D still low will repeat as of 06/20/19, Disp: , Rfl:  .  clopidogrel (PLAVIX) 75 MG tablet, TAKE 1 TABLET BY MOUTH DAILY, Disp: 90 tablet, Rfl: 3 .  co-enzyme Q-10 30 MG capsule, Take 30 mg by mouth daily. , Disp: , Rfl:  .  cyclobenzaprine (FLEXERIL) 5 MG tablet, Take 1 tablet (5 mg total) by mouth 3 (three) times daily as needed for muscle spasms. (Patient taking differently: Take 5 mg by mouth as needed for muscle spasms. ), Disp: 15 tablet, Rfl: 0 .  ezetimibe (ZETIA) 10 MG tablet, Take 1 tablet (10 mg total) by mouth daily., Disp: 90 tablet, Rfl: 3 .  gabapentin (NEURONTIN) 100 MG capsule, Take 100 mg by mouth 2 (two) times daily as needed., Disp: , Rfl:  .  GINKGO BILOBA PO, Take 1 capsule by mouth daily., Disp: , Rfl:  .  metoprolol succinate (TOPROL XL) 25 MG 24 hr tablet, Take 1 tablet (25 mg total) by mouth daily., Disp: 90 tablet, Rfl: 3 .  Niacin CR 1000 MG TBCR, niacin ER 1,000 mg tablet,extended release 24 hr, Disp: , Rfl:  .  nitroGLYCERIN (NITROSTAT) 0.4 MG SL tablet, Place 1 tablet (0.4 mg total) under the tongue every 5 (five) minutes x 3 doses as needed., Disp: 25 tablet, Rfl: 4 .  Omega-3 Fatty Acids (FISH OIL) 1000 MG CAPS, 1 tab po qd , Disp: , Rfl:  .  pantoprazole (PROTONIX) 40 MG tablet, Take 1 tablet (40 mg total) by mouth 2 (two) times daily before a meal. 30 minutes. Try to reduce to 1x per day now  or in 3 months, Disp: 180 tablet, Rfl: 3 .  ramipril (ALTACE) 10 MG capsule, Take 1 capsule (10 mg total) by mouth daily., Disp: 90 capsule, Rfl: 3 .  rosuvastatin (CRESTOR) 40 MG tablet, Take 1 tablet (40 mg total) by mouth at bedtime., Disp: 90 tablet, Rfl: 3 .  sucralfate (  CARAFATE) 1 g tablet, Take 1 tablet (1 g total) by mouth 4 (four) times daily -  with meals and at bedtime., Disp: 120 tablet, Rfl: 11 .  vitamin E 100 UNIT capsule, Take 100 Units by mouth daily.  , Disp: , Rfl:  .  isosorbide mononitrate (IMDUR) 120 MG 24 hr tablet, Take 1 tablet (120 mg total) by mouth daily., Disp: 90 tablet, Rfl: 3  EXAM:  VITALS per patient if applicable:  GENERAL: alert, oriented, appears well and in no acute distress  HEENT: atraumatic, conjunttiva clear, no obvious abnormalities on inspection of external nose and ears  NECK: normal movements of the head and neck  LUNGS: on inspection no signs of respiratory distress, breathing rate appears normal, no obvious gross SOB, gasping or wheezing  CV: no obvious cyanosis  MS: moves all visible extremities without noticeable abnormality  PSYCH/NEURO: pleasant and cooperative, no obvious depression or anxiety, speech and thought processing grossly intact  ASSESSMENT AND PLAN:  Discussed the following assessment and plan:  Upper respiratory tract infection, unspecified type rec he get covid 19 tested  If not better consider Augmentin vs Zpack in future pt will let me know   Aortic atherosclerosis (HCC) Will cc cards  Control risk factors   CT + Diverticulosis Kidney cysts  Reviewed benign etiology   Dyslipidemia goal ldl  <70 Essential hypertension - Plan: Comprehensive metabolic panel, CBC with Differential/Platelet, Lipid panel Atherosclerosis of coronary artery bypass graft of native heart without angina pectoris -cont meds and f/u cards   Prediabetes - Plan: Hemoglobin A1c  Vitamin D deficiency - Plan: Vitamin D (25  hydroxy)  HM flu [id number] - utdzostavax, prevnar, pna 23,Tdap ? If had 08/07/08 Rx  Tdap andshingrix mailed 02/15/2019  Consider re vax pna 23 last had 04/25/14  Consider covid 19 vaccine   Consider hep B vaccine not immune MMR immune PSA had 12/01/18 1.35 normal  -Dr. Rogers Blocker urology   Had eye exam Dr. Kirke Corin get records Never smoker no CT chest  [place] 04/2013 abdominal aorta normal   Colonoscopy had 05/02/15 Dr. Fuller Plan sessile polyp repeat 04/2020 per report. Saw Dr. Silvio Pate 9/2019and 4/7/2020and 10/03/18 had upper endoscopy negative bx  saw derm 05/03/17 noted SK, lentigos, rosacea, Aks face and arms f/u in 1 year [facility]. [place].   -we discussed possible serious and likely etiologies, options for evaluation and workup, limitations of telemedicine visit vs in person visit, treatment, treatment risks and precautions. Pt prefers to treat via telemedicine empirically rather then risking or undertaking an in person visit at this moment. Patient agrees to seek prompt in person care if worsening, new symptoms arise, or if is not improving with treatment.   I discussed the assessment and treatment plan with the patient. The patient was provided an opportunity to ask questions and all were answered. The patient agreed with the plan and demonstrated an understanding of the instructions.   The patient was advised to call back or seek an in-person evaluation if the symptoms worsen or if the condition fails to improve as anticipated.  Time spent 20 minutes  Delorise Jackson, MD

## 2019-07-18 ENCOUNTER — Telehealth: Payer: Self-pay

## 2019-07-18 ENCOUNTER — Other Ambulatory Visit (INDEPENDENT_AMBULATORY_CARE_PROVIDER_SITE_OTHER): Payer: Medicare Other

## 2019-07-18 ENCOUNTER — Other Ambulatory Visit: Payer: Self-pay

## 2019-07-18 DIAGNOSIS — I10 Essential (primary) hypertension: Secondary | ICD-10-CM | POA: Diagnosis not present

## 2019-07-18 DIAGNOSIS — Z1329 Encounter for screening for other suspected endocrine disorder: Secondary | ICD-10-CM

## 2019-07-18 DIAGNOSIS — R7303 Prediabetes: Secondary | ICD-10-CM

## 2019-07-18 DIAGNOSIS — E559 Vitamin D deficiency, unspecified: Secondary | ICD-10-CM | POA: Diagnosis not present

## 2019-07-18 DIAGNOSIS — E785 Hyperlipidemia, unspecified: Secondary | ICD-10-CM | POA: Diagnosis not present

## 2019-07-18 LAB — CBC WITH DIFFERENTIAL/PLATELET
Basophils Absolute: 0 10*3/uL (ref 0.0–0.1)
Basophils Relative: 0.4 % (ref 0.0–3.0)
Eosinophils Absolute: 0 10*3/uL (ref 0.0–0.7)
Eosinophils Relative: 0.6 % (ref 0.0–5.0)
HCT: 43.4 % (ref 39.0–52.0)
Hemoglobin: 14.9 g/dL (ref 13.0–17.0)
Lymphocytes Relative: 20.8 % (ref 12.0–46.0)
Lymphs Abs: 1.1 10*3/uL (ref 0.7–4.0)
MCHC: 34.3 g/dL (ref 30.0–36.0)
MCV: 90.4 fl (ref 78.0–100.0)
Monocytes Absolute: 0.4 10*3/uL (ref 0.1–1.0)
Monocytes Relative: 8.1 % (ref 3.0–12.0)
Neutro Abs: 3.8 10*3/uL (ref 1.4–7.7)
Neutrophils Relative %: 70.1 % (ref 43.0–77.0)
Platelets: 149 10*3/uL — ABNORMAL LOW (ref 150.0–400.0)
RBC: 4.79 Mil/uL (ref 4.22–5.81)
RDW: 14.4 % (ref 11.5–15.5)
WBC: 5.4 10*3/uL (ref 4.0–10.5)

## 2019-07-18 LAB — COMPREHENSIVE METABOLIC PANEL
ALT: 13 U/L (ref 0–53)
AST: 17 U/L (ref 0–37)
Albumin: 4.3 g/dL (ref 3.5–5.2)
Alkaline Phosphatase: 50 U/L (ref 39–117)
BUN: 11 mg/dL (ref 6–23)
CO2: 28 mEq/L (ref 19–32)
Calcium: 9.7 mg/dL (ref 8.4–10.5)
Chloride: 101 mEq/L (ref 96–112)
Creatinine, Ser: 0.96 mg/dL (ref 0.40–1.50)
GFR: 77.08 mL/min (ref >60.00)
Glucose, Bld: 121 mg/dL — ABNORMAL HIGH (ref 70–99)
Potassium: 4.2 mEq/L (ref 3.5–5.1)
Sodium: 137 mEq/L (ref 135–145)
Total Bilirubin: 1 mg/dL (ref 0.2–1.2)
Total Protein: 6.9 g/dL (ref 6.0–8.3)

## 2019-07-18 LAB — LIPID PANEL
Cholesterol: 138 mg/dL (ref 0–200)
HDL: 48.4 mg/dL (ref >39.00)
LDL Cholesterol: 56 mg/dL (ref 0–99)
NonHDL: 89.5
Total CHOL/HDL Ratio: 3
Triglycerides: 170 mg/dL — ABNORMAL HIGH (ref 0.0–149.0)
VLDL: 34 mg/dL (ref 0.0–40.0)

## 2019-07-18 LAB — HEMOGLOBIN A1C: Hgb A1c MFr Bld: 6.8 % — ABNORMAL HIGH (ref 4.6–6.5)

## 2019-07-18 LAB — TSH: TSH: 1.4 u[IU]/mL (ref 0.35–4.50)

## 2019-07-18 LAB — VITAMIN D 25 HYDROXY (VIT D DEFICIENCY, FRACTURES): VITD: 50.88 ng/mL (ref 30.00–100.00)

## 2019-07-18 NOTE — Telephone Encounter (Signed)
Patient was informed. Patient will wait to have appointment with Dr Olivia Mackie to try and get ordered.

## 2019-07-18 NOTE — Telephone Encounter (Signed)
I spoke with the lab staff. The patients labs were drawn after 10 am. The testosterone has to be drawn prior to 10 am. I can order this to be completed for him if he can come back in for labs. Please also confirm a diagnosis for this to be completed.

## 2019-07-18 NOTE — Telephone Encounter (Signed)
Patient requested that a testerone lab be ordered. Please advise.

## 2019-08-14 ENCOUNTER — Other Ambulatory Visit: Payer: Self-pay | Admitting: Internal Medicine

## 2019-08-14 DIAGNOSIS — K5792 Diverticulitis of intestine, part unspecified, without perforation or abscess without bleeding: Secondary | ICD-10-CM

## 2019-08-14 MED ORDER — METRONIDAZOLE 500 MG PO TABS: 500.0000 mg | ORAL_TABLET | Freq: Three times a day (TID) | ORAL | 0 refills | Status: DC

## 2019-08-14 MED ORDER — CIPROFLOXACIN HCL 500 MG PO TABS: 500.0000 mg | ORAL_TABLET | Freq: Two times a day (BID) | ORAL | 0 refills | Status: DC

## 2019-08-14 NOTE — Telephone Encounter (Signed)
See message request for GI appt

## 2019-08-14 NOTE — Telephone Encounter (Signed)
Pt is having a diverticulitis flare up and needs refills on Zepro and Flagyl sent to [facility]

## 2019-08-14 NOTE — Telephone Encounter (Signed)
I have sent cipro and flagyl but this is a GI issue not primary care issue Please encourage pt to call GI Dr. Silvio Pate and schedule f/u for abdominal pain  Dr. Silvio Pate can your office reach out to pt to schedule appt    [place]

## 2019-08-15 NOTE — Telephone Encounter (Signed)
For your information  

## 2019-08-15 NOTE — Telephone Encounter (Signed)
Patient scheduled for 09/13/19 at 9:30am. Informed patient if he has finished antibiotics and is symptomatic to contact our office and we will schedule a sooner appt. Patient states he started the antibiotics last night and he is already feeling better today but he will call our office if he has any symptoms after he finishes the antibiotics.

## 2019-09-10 DIAGNOSIS — M545 Low back pain: Secondary | ICD-10-CM | POA: Diagnosis not present

## 2019-09-10 DIAGNOSIS — M25551 Pain in right hip: Secondary | ICD-10-CM | POA: Diagnosis not present

## 2019-09-13 ENCOUNTER — Ambulatory Visit: Payer: Medicare Other | Admitting: Gastroenterology

## 2019-09-22 ENCOUNTER — Encounter: Payer: Self-pay | Admitting: Internal Medicine

## 2019-09-22 ENCOUNTER — Other Ambulatory Visit: Payer: Self-pay

## 2019-09-22 ENCOUNTER — Ambulatory Visit (INDEPENDENT_AMBULATORY_CARE_PROVIDER_SITE_OTHER): Payer: Medicare Other | Admitting: Internal Medicine

## 2019-09-22 VITALS — BP 136/80 | HR 75 | Temp 97.1°F | Ht 69.0 in | Wt 214.0 lb

## 2019-09-22 DIAGNOSIS — G5701 Lesion of sciatic nerve, right lower limb: Secondary | ICD-10-CM | POA: Diagnosis not present

## 2019-09-22 DIAGNOSIS — K219 Gastro-esophageal reflux disease without esophagitis: Secondary | ICD-10-CM | POA: Diagnosis not present

## 2019-09-22 DIAGNOSIS — I10 Essential (primary) hypertension: Secondary | ICD-10-CM

## 2019-09-22 DIAGNOSIS — K5792 Diverticulitis of intestine, part unspecified, without perforation or abscess without bleeding: Secondary | ICD-10-CM | POA: Diagnosis not present

## 2019-09-22 DIAGNOSIS — E119 Type 2 diabetes mellitus without complications: Secondary | ICD-10-CM | POA: Diagnosis not present

## 2019-09-22 DIAGNOSIS — E785 Hyperlipidemia, unspecified: Secondary | ICD-10-CM | POA: Diagnosis not present

## 2019-09-22 DIAGNOSIS — I700 Atherosclerosis of aorta: Secondary | ICD-10-CM

## 2019-09-22 DIAGNOSIS — I2510 Atherosclerotic heart disease of native coronary artery without angina pectoris: Secondary | ICD-10-CM

## 2019-09-22 DIAGNOSIS — E669 Obesity, unspecified: Secondary | ICD-10-CM | POA: Diagnosis not present

## 2019-09-22 HISTORY — DX: Lesion of sciatic nerve, right lower limb: G57.01

## 2019-09-22 MED ORDER — CIPROFLOXACIN HCL 500 MG PO TABS: 500.0000 mg | ORAL_TABLET | Freq: Two times a day (BID) | ORAL | 0 refills | Status: DC

## 2019-09-22 MED ORDER — METRONIDAZOLE 500 MG PO TABS: 500.0000 mg | ORAL_TABLET | Freq: Three times a day (TID) | ORAL | 0 refills | Status: DC

## 2019-09-22 MED ORDER — CLOPIDOGREL BISULFATE 75 MG PO TABS: 75.0000 mg | ORAL_TABLET | Freq: Every day | ORAL | 3 refills | Status: DC

## 2019-09-22 MED ORDER — ROSUVASTATIN CALCIUM 40 MG PO TABS: 40.0000 mg | ORAL_TABLET | Freq: Every day | ORAL | 3 refills | Status: DC

## 2019-09-22 MED ORDER — EZETIMIBE 10 MG PO TABS: 10.0000 mg | ORAL_TABLET | Freq: Every day | ORAL | 3 refills | Status: DC

## 2019-09-22 MED ORDER — PANTOPRAZOLE SODIUM 40 MG PO TBEC: 40.0000 mg | DELAYED_RELEASE_TABLET | Freq: Two times a day (BID) | ORAL | 3 refills | Status: DC

## 2019-09-22 MED ORDER — AMLODIPINE BESYLATE 5 MG PO TABS: ORAL_TABLET | ORAL | 3 refills | Status: DC

## 2019-09-22 MED ORDER — RAMIPRIL 10 MG PO CAPS: 10.0000 mg | ORAL_CAPSULE | Freq: Every day | ORAL | 3 refills | Status: DC

## 2019-09-22 MED ORDER — METOPROLOL SUCCINATE ER 25 MG PO TB24: 25.0000 mg | ORAL_TABLET | Freq: Every day | ORAL | 3 refills | Status: DC

## 2019-09-22 MED ORDER — NITROGLYCERIN 0.4 MG SL SUBL: 0.4000 mg | SUBLINGUAL_TABLET | SUBLINGUAL | 4 refills | Status: DC | PRN

## 2019-09-22 NOTE — Patient Instructions (Addendum)
Debrox ear wax drops 5-10 drops daily let sit x 5-10 minutes x 1x per month   Water 55-64 ounces daily or HINT water   Leg cramps theraworx topical spray for spray    Talk to Dr. [name] whether or not you need to be on Imdur   Dr. Gloriann Loan eye exam 11/2019  Results for Kevin Webb, Kevin "GENE" (MRN Kevin Webb) as of 09/22/2019 13:46  Ref. Range 07/18/2019 09:56  Hemoglobin A1C Latest Ref Range: 4.6 - 6.5 % 6.8 (H)    Pfizer vaccine  COVID-19 Vaccine Information can be found at: ShippingScam.co.uk For questions related to vaccine distribution or appointments, please email [email or web address] or call [phone].   [facility]   Only if you have covid  Vitamin C 1000 mg daily  Vitamin D3 4000 to 5000 IU daily  Quercetin 250-500 mg 2x per day  Zinc 100 mg daily  Oil of oregano  [place]  Mucinex DM green label    Steroids and inhaler if needed

## 2019-09-22 NOTE — Progress Notes (Signed)
Chief Complaint  Patient presents with  . Follow-up   F/u overall doing well  1. CAD s/p CABG x 4 needs refill of ntg and stopped imdur 120 mg qd f/u Dr. [name] needs to be scheduled  HTN sl elevated today on norvasc 5 mg qd, toprol xl 25 mg qd, altace 10 mg qd  HLD uncontrolled Tgs 170 on crestor 40 and zetia 10 mg qd  Aortic atherosclerosis  2. DM 2 A1C was 6.8 but he wants to work on diet before meds  3. H/o diverticulosis/itis currently w/o abdominal pain and missed GI appt 07/2019 with Dr. Silvio Pate reviewed with pt again today all GI issues f/u with GI and recurrent use of abx can cause resistance and next flare of what he thinks is diverticulitis needs to sch appt with GI  4. Recent right piriformis syndrome steroid injection helped and feeling better after 1 month  5. BPH f/u Dr. Rogers Blocker and urinating >2 x at night on flomax at night per pt   Review of Systems  Constitutional: Negative for weight loss.  HENT: Negative for hearing loss.   Eyes: Negative for blurred vision.  Respiratory: Negative for shortness of breath.   Cardiovascular: Negative for chest pain.  Gastrointestinal: Negative for abdominal pain.  Musculoskeletal: Negative for falls and joint pain.  Skin: Negative for rash.  Neurological: Negative for headaches.  Psychiatric/Behavioral: Negative for depression and memory loss.   Past Medical History:  Diagnosis Date  . Acute cholecystitis 04/18/2013  . Adenomatous colon polyp 12/2004  . Anemia   . Blood transfusion without reported diagnosis   . BPH associated with nocturia   . CAD (coronary artery disease)    a. s/p CABG x4 in 2000 with LIMA-LAD, reverse SVG-IM, reverse SVG-acute margin and reverse SVG-RCA b. s/p BMS to SVG-RI in 2012 c. 12/2016: PCI/DES to SVG-RCA and PCI/DES to SVG-RI (Dr. [name])   . Carotid artery stenosis    mild   . Cataract   . Clotting disorder ([place])   . Diabetes mellitus without complication ([place]) 95/18/8416   recent A1C 6.1 12/25/16  controlled with diet and exercise   . Diverticulitis 2008/2009  . GERD (gastroesophageal reflux disease)    controlled as of 04/01/17   . HSV infection   . HTN (hypertension)   . Hx of dysplastic nevus 05/04/2018   upper back spinal   . Hyperglycemia   . Hyperlipidemia   . Impingement syndrome of left shoulder 2016  . Low testosterone    Dr [name], [place] ,[place]  . Myocardial infarction ([place])   . Osteoarthritis of right knee    With trace effusion  . Peyronie disease   . RLS (restless legs syndrome) 08/15/2013  . Sinus bradycardia   . Thrombocytopenia ([place])   . Vitamin D deficiency    Past Surgical History:  Procedure Laterality Date  . CHOLECYSTECTOMY N/A 04/21/2013   Procedure: LAPAROSCOPIC CHOLECYSTECTOMY WITH INTRAOPERATIVE CHOLANGIOGRAM;  Surgeon: Haywood Lasso, MD;  Location: [facility];  Service: General;  Laterality: N/A;  . CHOLECYSTECTOMY    . COLONOSCOPY W/ POLYPECTOMY  2004 & 2009    X 2; Dr Fuller Plan; due 2014  . CORONARY ANGIOPLASTY    . CORONARY ARTERY BYPASS GRAFT  04/1999   4 vessels  . CORONARY STENT INTERVENTION N/A 12/28/2016   Procedure: CORONARY STENT INTERVENTION;  Surgeon: Lorretta Harp, MD;  Location: [facility];  Service: Cardiovascular;  Laterality: N/A;  . CORONARY STENT PLACEMENT  2012   Plavix  .  LAPAROSCOPIC CHOLECYSTECTOMY  04/21/2013   Dr Margot Chimes; acutecholecystitis with necrosis  . LEFT HEART CATH AND CORS/GRAFTS ANGIOGRAPHY N/A 12/28/2016   Procedure: LEFT HEART CATH AND CORS/GRAFTS ANGIOGRAPHY;  Surgeon: Lorretta Harp, MD;  Location: [facility];  Service: Cardiovascular;  Laterality: N/A;  . VASECTOMY     Family History  Problem Relation Age of Onset  . Aortic aneurysm Father 65       ? abdominal  . Heart attack Father 74  . Diabetes Other        PGaunt  . Coronary artery disease Mother        CABG in 25s  . CVA Mother        cns bleed from warfarin  . Aneurysm Mother   . Diabetes Brother   . Cholecystitis Sister    . Cancer Neg Hx   . Colon cancer Neg Hx   . Prostate cancer Neg Hx   . Esophageal cancer Neg Hx   . Stomach cancer Neg Hx   . Rectal cancer Neg Hx    Social History   Socioeconomic History  . Marital status: Married    Spouse name: Not on file  . Number of children: 1  . Years of education: Not on file  . Highest education level: Not on file  Occupational History  . Occupation:      Comment: plumber  Tobacco Use  . Smoking status: Never Smoker  . Smokeless tobacco: Never Used  Substance and Sexual Activity  . Alcohol use: Yes    Alcohol/week: 10.0 - 12.0 standard drinks    Types: 10 - 12 Cans of beer per week    Comment: socially   . Drug use: No  . Sexual activity: Not on file  Other Topics Concern  . Not on file  Social History Narrative   Remarried 2014   1 son age 25 as of 03/2017    Social Determinants of Health   Financial Resource Strain:   . Difficulty of Paying Living Expenses:   Food Insecurity:   . Worried About Charity fundraiser in the Last Year:   . Arboriculturist in the Last Year:   Transportation Needs:   . Film/video editor (Medical):   Marland Kitchen Lack of Transportation (Non-Medical):   Physical Activity: Sufficiently Active  . Days of Exercise per Week: 7 days  . Minutes of Exercise per Session: 60 min  Stress:   . Feeling of Stress :   Social Connections:   . Frequency of Communication with Friends and Family:   . Frequency of Social Gatherings with Friends and Family:   . Attends Religious Services:   . Active Member of Clubs or Organizations:   . Attends Archivist Meetings:   Marland Kitchen Marital Status:   Intimate Partner Violence:   . Fear of Current or Ex-Partner:   . Emotionally Abused:   Marland Kitchen Physically Abused:   . Sexually Abused:    Current Meds  Medication Sig  . amLODipine (NORVASC) 5 MG tablet TAKE ONE (1) TABLET EACH DAY  . Ascorbic Acid (VITAMIN C) 1000 MG tablet Take 1,000 mg by mouth daily. Reported on 09/26/2015  .  aspirin 81 MG tablet Take 81 mg by mouth daily.  . Cholecalciferol (VITAMIN D3) 5000 UNITS CAPS Take by mouth daily. Alternates 10K IU and 15 K IU D3 was on 5K IU qd and vitamin D still low will repeat as of 06/20/19  . clopidogrel (PLAVIX)  75 MG tablet Take 1 tablet (75 mg total) by mouth daily.  Marland Kitchen co-enzyme Q-10 30 MG capsule Take 30 mg by mouth daily.   . cyclobenzaprine (FLEXERIL) 5 MG tablet Take 1 tablet (5 mg total) by mouth 3 (three) times daily as needed for muscle spasms. (Patient taking differently: Take 5 mg by mouth as needed for muscle spasms. )  . ezetimibe (ZETIA) 10 MG tablet Take 1 tablet (10 mg total) by mouth daily.  Marland Kitchen GINKGO BILOBA PO Take 1 capsule by mouth daily.  . metoprolol succinate (TOPROL XL) 25 MG 24 hr tablet Take 1 tablet (25 mg total) by mouth daily.  . Niacin CR 1000 MG TBCR niacin ER 1,000 mg tablet,extended release 24 hr  . Omega-3 Fatty Acids (FISH OIL) 1000 MG CAPS 1 tab po qd   . pantoprazole (PROTONIX) 40 MG tablet Take 1 tablet (40 mg total) by mouth 2 (two) times daily before a meal. 30 minutes. Try to reduce to 1x per day now or in 3 months  . ramipril (ALTACE) 10 MG capsule Take 1 capsule (10 mg total) by mouth daily.  . rosuvastatin (CRESTOR) 40 MG tablet Take 1 tablet (40 mg total) by mouth at bedtime.  . sucralfate (CARAFATE) 1 g tablet Take 1 tablet (1 g total) by mouth 4 (four) times daily -  with meals and at bedtime.  . tamsulosin (FLOMAX) 0.4 MG CAPS capsule Take 0.4 mg by mouth.  . vitamin E 100 UNIT capsule Take 100 Units by mouth daily.    . [DISCONTINUED] amLODipine (NORVASC) 5 MG tablet TAKE ONE (1) TABLET EACH DAY  . [DISCONTINUED] clopidogrel (PLAVIX) 75 MG tablet TAKE 1 TABLET BY MOUTH DAILY  . [DISCONTINUED] ezetimibe (ZETIA) 10 MG tablet Take 1 tablet (10 mg total) by mouth daily.  . [DISCONTINUED] metoprolol succinate (TOPROL XL) 25 MG 24 hr tablet Take 1 tablet (25 mg total) by mouth daily.  . [DISCONTINUED] pantoprazole (PROTONIX) 40  MG tablet Take 1 tablet (40 mg total) by mouth 2 (two) times daily before a meal. 30 minutes. Try to reduce to 1x per day now or in 3 months  . [DISCONTINUED] ramipril (ALTACE) 10 MG capsule Take 1 capsule (10 mg total) by mouth daily.  . [DISCONTINUED] rosuvastatin (CRESTOR) 40 MG tablet Take 1 tablet (40 mg total) by mouth at bedtime.   Allergies  Allergen Reactions  . Darvocet [Propoxyphene N-Acetaminophen]   . Propoxyphene Hives   Recent Results (from the past 2160 hour(s))  Vitamin D (25 hydroxy)     Status: None   Collection Time: 07/18/19  9:56 AM  Result Value Ref Range   VITD 50.88 30.00 - 100.00 ng/mL  Hemoglobin A1c     Status: Abnormal   Collection Time: 07/18/19  9:56 AM  Result Value Ref Range   Hgb A1c MFr Bld 6.8 (H) 4.6 - 6.5 %    Comment: Glycemic Control Guidelines for People with Diabetes:Non Diabetic:  <6%Goal of Therapy: <7%Additional Action Suggested:  >8%   TSH     Status: None   Collection Time: 07/18/19  9:56 AM  Result Value Ref Range   TSH 1.40 0.35 - 4.50 uIU/mL  Lipid panel     Status: Abnormal   Collection Time: 07/18/19  9:56 AM  Result Value Ref Range   Cholesterol 138 0 - 200 mg/dL    Comment: ATP III Classification       Desirable:  < 200 mg/dL  Borderline High:  200 - 239 mg/dL          High:  > = 240 mg/dL   Triglycerides 170.0 (H) 0.0 - 149.0 mg/dL    Comment: Normal:  <150 mg/dLBorderline High:  150 - 199 mg/dL   HDL 48.40 >39.00 mg/dL   VLDL 34.0 0.0 - 40.0 mg/dL   LDL Cholesterol 56 0 - 99 mg/dL   Total CHOL/HDL Ratio 3     Comment:                Men          Women1/2 Average Risk     3.4          3.3Average Risk          5.0          4.42X Average Risk          9.6          7.13X Average Risk          15.0          11.0                       NonHDL 89.50     Comment: NOTE:  Non-HDL goal should be 30 mg/dL higher than patient's LDL goal (i.e. LDL goal of < 70 mg/dL, would have non-HDL goal of < 100 mg/dL)  CBC with  Differential/Platelet     Status: Abnormal   Collection Time: 07/18/19  9:56 AM  Result Value Ref Range   WBC 5.4 4.0 - 10.5 K/uL   RBC 4.79 4.22 - 5.81 Mil/uL   Hemoglobin 14.9 13.0 - 17.0 g/dL   HCT 43.4 39.0 - 52.0 %   MCV 90.4 78.0 - 100.0 fl   MCHC 34.3 30.0 - 36.0 g/dL   RDW 14.4 11.5 - 15.5 %   Platelets 149.0 (L) 150.0 - 400.0 K/uL   Neutrophils Relative % 70.1 43.0 - 77.0 %   Lymphocytes Relative 20.8 12.0 - 46.0 %   Monocytes Relative 8.1 3.0 - 12.0 %   Eosinophils Relative 0.6 0.0 - 5.0 %   Basophils Relative 0.4 0.0 - 3.0 %   Neutro Abs 3.8 1.4 - 7.7 K/uL   Lymphs Abs 1.1 0.7 - 4.0 K/uL   Monocytes Absolute 0.4 0.1 - 1.0 K/uL   Eosinophils Absolute 0.0 0.0 - 0.7 K/uL   Basophils Absolute 0.0 0.0 - 0.1 K/uL  Comprehensive metabolic panel     Status: Abnormal   Collection Time: 07/18/19  9:56 AM  Result Value Ref Range   Sodium 137 135 - 145 mEq/L   Potassium 4.2 3.5 - 5.1 mEq/L   Chloride 101 96 - 112 mEq/L   CO2 28 19 - 32 mEq/L   Glucose, Bld 121 (H) 70 - 99 mg/dL   BUN 11 6 - 23 mg/dL   Creatinine, Ser 0.96 0.40 - 1.50 mg/dL   Total Bilirubin 1.0 0.2 - 1.2 mg/dL   Alkaline Phosphatase 50 39 - 117 U/L   AST 17 0 - 37 U/L   ALT 13 0 - 53 U/L   Total Protein 6.9 6.0 - 8.3 g/dL   Albumin 4.3 3.5 - 5.2 g/dL   GFR 77.08 >60.00 mL/min   Calcium 9.7 8.4 - 10.5 mg/dL   Objective  Body mass index is 31.6 kg/m. Wt Readings from Last 3 Encounters:  09/22/19 214 lb (97.1 kg)  06/20/19 204 lb (92.5 kg)  02/15/19 210 lb 12.8 oz (95.6 kg)   Temp Readings from Last 3 Encounters:  09/22/19 (!) 97.1 F (36.2 C) (Temporal)  12/07/18 97.7 F (36.5 C) (Temporal)  10/03/18 98.5 F (36.9 C)   BP Readings from Last 3 Encounters:  09/22/19 136/80  06/20/19 122/76  12/07/18 122/71   Pulse Readings from Last 3 Encounters:  09/22/19 75  12/07/18 64  10/06/18 70    Physical Exam Vitals and nursing note reviewed.  Constitutional:      Appearance: Normal  appearance. He is well-developed and well-groomed. He is obese.  HENT:     Head: Normocephalic and atraumatic.  Eyes:     Conjunctiva/sclera: Conjunctivae normal.     Pupils: Pupils are equal, round, and reactive to light.  Cardiovascular:     Rate and Rhythm: Normal rate and regular rhythm.     Heart sounds: Normal heart sounds. No murmur.  Pulmonary:     Effort: Pulmonary effort is normal.     Breath sounds: Normal breath sounds.  Skin:    General: Skin is warm and dry.  Neurological:     General: No focal deficit present.     Mental Status: He is alert and oriented to person, place, and time. Mental status is at baseline.     Gait: Gait normal.  Psychiatric:        Attention and Perception: Attention and perception normal.        Mood and Affect: Mood and affect normal.        Speech: Speech normal.        Behavior: Behavior normal. Behavior is cooperative.        Thought Content: Thought content normal.        Cognition and Memory: Cognition and memory normal.        Judgment: Judgment normal.     Assessment  Plan  Essential hypertension - Plan: ramipril (ALTACE) 10 MG capsule, amLODipine (NORVASC) 5 MG tablet, crestor 40 mg qhs and zetia 10 mg qd, prn NTG CAD s/p CABG x 4/HLD Aortic atherosclerosis ([place]) -pt needs appt with cards Dr. [name] by 12/07/19 and he stopped imdur 120 mg qd to disc with with cards   Diverticulitis/GERD- Plan: metroNIDAZOLE (FLAGYL) 500 MG tablet, ciprofloxacin (CIPRO) 500 MG tablet Further issues pt must f/u with GI Dr. Silvio Pate see HPI  Obesity and Type 2 diabetes mellitus without complication with HLD/HTN/CAD - Plan: ezetimibe (ZETIA) 10 MG tablet rec healthy diet and exercise  A1C 6.8 pt wants to try diet and exercise  Piriformis syndrome of right side resolved s/p steroid injection 1 month ago   HM flu shotutd2020 - utdzostavax, prevnar, pna 23,Tdap ? If had 08/07/08 RxTdap andshingrixmailed 02/15/2019 Consider covid 19  vaccine declines as of 09/22/19  Consider hep B vaccine not immune MMR immune PSA had7/16/20 1.35 normal  -Dr. Rogers Blocker urology   Had eye exam Dr. Kirke Corin get records Never smoker no CT chest  [place] 04/2013 abdominal aorta normal   Colonoscopy had 05/02/15 Dr. Fuller Plan sessile polyp repeat 04/2020 per report. Saw Dr. Silvio Pate 9/2019and 4/7/2020and 10/03/18 had upper endoscopy negative bx  saw derm 05/03/17 noted SK, lentigos, rosacea, Aks face and arms f/u in 1 year [facility]. [place].   Provider: Dr. Olivia Mackie McLean-Scocuzza-Internal Medicine

## 2019-10-05 ENCOUNTER — Telehealth: Payer: Self-pay | Admitting: Cardiology

## 2019-10-05 NOTE — Telephone Encounter (Signed)
  Patient sent below message through appointment request:  stress test i am having shortness of breath and always tired blood psi 138/81 pulse59  I do not see orders for a stress test and told patient I would send note to the nurse.

## 2019-10-05 NOTE — Telephone Encounter (Signed)
Per pt wife was recently tested for covid antibodies and did at some point have covid Pt was wanting to get tested as well Instructed pt to contact PCP to order testing Pt verbalizes understanding ./cy

## 2019-10-05 NOTE — Telephone Encounter (Signed)
Patient calling back requesting lab orders be put in so he can have them done in [place].

## 2019-10-05 NOTE — Telephone Encounter (Signed)
Spoke with pt and for the last couple of days has noted SOB,fatigue,heavy feeling in legs with cold feet and lightheadedness Appointment made with Dr [name] for 10/12/19 at 9:00 am Informed pt if S/S worsen then should go to ED Pt agrees with plan Will forward to Dr [name] for review .Adonis Housekeeper

## 2019-10-10 NOTE — Progress Notes (Signed)
Cardiology Office Note   Date:  10/12/2019   ID:  Kevin Webb, DOB 08/24/1945, MRN [id number]  PCP:  McLean-Scocuzza, Nino Glow, MD  Cardiologist:   Minus Breeding, MD   Chief Complaint  Patient presents with  . Dizziness  . Leg Pain      History of Present Illness: Kevin Webb is a 74 y.o. male who presents for evaluation of CAD. He was admitted from 8/10- 12/29/2016 for evaluation of chest pain with elevated troponin. Cardiac catheterization on 12/28/2016 demonstrated severe native disease but the LIMA-LAD was patent with 75% stenosis noted along the SVG-RCA and 90% stenosis along the SVG-RI. Successful PCI with DES placement was performed to both lesions. He was started on DAPT with ASA and Brilinta along with being continued on BB and statin therapy.   He called recently with cold feet and lightheadedness.  He called to be added to the schedule.  His symptoms seem to happen last week.  It is a long story that he had some piriformis syndrome and needed injection.  He had leg cramping and he read that this was probably secondary to magnesium deficiency so he self medicated with magnesium.  Developed more leg fatigue and leg pain.  He was working with usually crazy hours.  He was doing a lot of outside and heavy work.  He then got some fatigue.  He might of had some chest discomfort or just profound tiredness when walking to his truck.  He quit taking the over-the-counter supplements he was taking.  He rested a little bit.  This was all last week.  He now feels better over the last couple of days.  He is been now able to be very active and without gathering hay and worked very hard yesterday.  With that he has no chest pressure, neck or arm discomfort.  He did not have any shortness of breath, PND or orthopnea.  He has not had any presyncope or syncope.  His leg cramping seems to be improved.  We did check him today and he was not orthostatic.  Of note he had a couple bouts of  diverticulitis in the last week as well.   Past Medical History:  Diagnosis Date  . Acute cholecystitis 04/18/2013  . Adenomatous colon polyp 12/2004  . Anemia   . Blood transfusion without reported diagnosis   . BPH associated with nocturia   . CAD (coronary artery disease)    a. s/p CABG x4 in 2000 with LIMA-LAD, reverse SVG-IM, reverse SVG-acute margin and reverse SVG-RCA b. s/p BMS to SVG-RI in 2012 c. 12/2016: PCI/DES to SVG-RCA and PCI/DES to SVG-RI (Dr. [name])   . Carotid artery stenosis    mild   . Cataract   . Clotting disorder ([place])   . Diabetes mellitus without complication ([place]) [id number]   recent A1C 6.1 12/25/16 controlled with diet and exercise   . Diverticulitis 2008/2009  . GERD (gastroesophageal reflux disease)    controlled as of 04/01/17   . HSV infection   . HTN (hypertension)   . Hx of dysplastic nevus 05/04/2018   upper back spinal   . Hyperglycemia   . Hyperlipidemia   . Impingement syndrome of left shoulder 2016  . Low testosterone    Dr [name], [place] ,[place]  . Myocardial infarction ([place])   . Osteoarthritis of right knee    With trace effusion  . Peyronie disease   . RLS (restless legs syndrome) 08/15/2013  . Sinus bradycardia   .  Thrombocytopenia ([place])   . Vitamin D deficiency     Past Surgical History:  Procedure Laterality Date  . CHOLECYSTECTOMY N/A 04/21/2013   Procedure: LAPAROSCOPIC CHOLECYSTECTOMY WITH INTRAOPERATIVE CHOLANGIOGRAM;  Surgeon: Haywood Lasso, MD;  Location: [facility];  Service: General;  Laterality: N/A;  . CHOLECYSTECTOMY    . COLONOSCOPY W/ POLYPECTOMY  2004 & 2009    X 2; Dr Fuller Plan; due 2014  . CORONARY ANGIOPLASTY    . CORONARY ARTERY BYPASS GRAFT  04/1999   4 vessels  . CORONARY STENT INTERVENTION N/A 12/28/2016   Procedure: CORONARY STENT INTERVENTION;  Surgeon: Lorretta Harp, MD;  Location: [facility];  Service: Cardiovascular;  Laterality: N/A;  . CORONARY STENT PLACEMENT  2012   Plavix  .  LAPAROSCOPIC CHOLECYSTECTOMY  04/21/2013   Dr Margot Chimes; acutecholecystitis with necrosis  . LEFT HEART CATH AND CORS/GRAFTS ANGIOGRAPHY N/A 12/28/2016   Procedure: LEFT HEART CATH AND CORS/GRAFTS ANGIOGRAPHY;  Surgeon: Lorretta Harp, MD;  Location: [facility];  Service: Cardiovascular;  Laterality: N/A;  . VASECTOMY       Current Outpatient Medications  Medication Sig Dispense Refill  . amLODipine (NORVASC) 5 MG tablet TAKE ONE (1) TABLET EACH DAY 90 tablet 3  . Ascorbic Acid (VITAMIN C) 1000 MG tablet Take 1,000 mg by mouth daily. Reported on 09/26/2015    . aspirin 81 MG tablet Take 81 mg by mouth daily.    . Cholecalciferol (VITAMIN D3) 5000 UNITS CAPS Take by mouth daily. Alternates 10K IU and 15 K IU D3 was on 5K IU qd and vitamin D still low will repeat as of 06/20/19    . ciprofloxacin (CIPRO) 500 MG tablet Take 1 tablet (500 mg total) by mouth 2 (two) times daily. 14 tablet 0  . clopidogrel (PLAVIX) 75 MG tablet Take 1 tablet (75 mg total) by mouth daily. 90 tablet 3  . co-enzyme Q-10 30 MG capsule Take 30 mg by mouth daily.     . cyclobenzaprine (FLEXERIL) 5 MG tablet Take 1 tablet (5 mg total) by mouth 3 (three) times daily as needed for muscle spasms. (Patient taking differently: Take 5 mg by mouth as needed for muscle spasms. ) 15 tablet 0  . ezetimibe (ZETIA) 10 MG tablet Take 1 tablet (10 mg total) by mouth daily. 90 tablet 3  . gabapentin (NEURONTIN) 100 MG capsule Take 100 mg by mouth 2 (two) times daily as needed.    Marland Kitchen GINKGO BILOBA PO Take 1 capsule by mouth daily.    . metoprolol succinate (TOPROL XL) 25 MG 24 hr tablet Take 1 tablet (25 mg total) by mouth daily. 90 tablet 3  . metroNIDAZOLE (FLAGYL) 500 MG tablet Take 1 tablet (500 mg total) by mouth 3 (three) times daily. With food 21 tablet 0  . Niacin CR 1000 MG TBCR niacin ER 1,000 mg tablet,extended release 24 hr    . nitroGLYCERIN (NITROSTAT) 0.4 MG SL tablet Place 1 tablet (0.4 mg total) under the tongue every  5 (five) minutes x 3 doses as needed. 25 tablet 4  . Omega-3 Fatty Acids (FISH OIL) 1000 MG CAPS 1 tab po qd     . pantoprazole (PROTONIX) 40 MG tablet Take 1 tablet (40 mg total) by mouth 2 (two) times daily before a meal. 30 minutes. Try to reduce to 1x per day now or in 3 months 180 tablet 3  . ramipril (ALTACE) 10 MG capsule Take 1 capsule (10 mg total) by mouth  daily. 90 capsule 3  . rosuvastatin (CRESTOR) 40 MG tablet Take 1 tablet (40 mg total) by mouth at bedtime. 90 tablet 3  . sucralfate (CARAFATE) 1 g tablet Take 1 tablet (1 g total) by mouth 4 (four) times daily -  with meals and at bedtime. 120 tablet 11  . tamsulosin (FLOMAX) 0.4 MG CAPS capsule Take 0.4 mg by mouth.    . vitamin E 100 UNIT capsule Take 100 Units by mouth daily.       No current facility-administered medications for this visit.    Allergies:   Darvocet [propoxyphene n-acetaminophen] and Propoxyphene    ROS:  Please see the history of present illness.   Otherwise, review of systems are positive for none.   All other systems are reviewed and negative.    PHYSICAL EXAM: VS:  Temp (!) 97 F (36.1 C)   Ht 5\' 9"  (1.753 m)   Wt 213 lb 12.8 oz (97 kg)   SpO2 99%   BMI 31.57 kg/m  , BMI Body mass index is 31.57 kg/m. GENERAL:  Well appearing NECK:  No jugular venous distention, waveform within normal limits, carotid upstroke brisk and symmetric, no bruits, no thyromegaly LUNGS:  Clear to auscultation bilaterally CHEST:  Well healed sternotomy scar. HEART:  PMI not displaced or sustained,S1 and S2 within normal limits, no S3, no S4, no clicks, no rubs, no murmurs ABD:  Flat, positive bowel sounds normal in frequency in pitch, no bruits, no rebound, no guarding, no midline pulsatile mass, no hepatomegaly, no splenomegaly EXT:  2 plus pulses throughout, no edema, no cyanosis no clubbing  EKG:  EKG  ordered today. Sinus rhythm, rate 66, right bundle branch block, premature atrial contractions, no acute ST-T wave  changes.  Recent Labs: 07/18/2019: ALT 13; BUN 11; Creatinine, Ser 0.96; Hemoglobin 14.9; Platelets 149.0; Potassium 4.2; Sodium 137; TSH 1.40   Lab Results  Component Value Date   HGBA1C 6.8 (H) 07/18/2019     Lipid Panel    Component Value Date/Time   CHOL 138 07/18/2019 0956   TRIG 170.0 (H) 07/18/2019 0956   HDL 48.40 07/18/2019 0956   CHOLHDL 3 07/18/2019 0956   VLDL 34.0 07/18/2019 0956   LDLCALC 56 07/18/2019 0956   LDLDIRECT 91.0 03/30/2016 1144      Wt Readings from Last 3 Encounters:  10/12/19 213 lb 12.8 oz (97 kg)  09/22/19 214 lb (97.1 kg)  06/20/19 204 lb (92.5 kg)      Other studies Reviewed: Additional studies/ records that were reviewed today include: Labs Review of the above records demonstrates:  Please see elsewhere in the note.     ASSESSMENT AND PLAN:   CAD/CABG-  The patient had very vague symptoms.  At this point he is really not had any new symptoms that are consistent with angina and like his previous symptoms in 2018.  I reviewed with him.  Of note he stopped taking the Imdur because he wanted to take Viagra.  I do not think he is having ongoing angina or unstable symptoms and he agrees.  Therefore, no further imaging or testing at this point.  He did have acute complaints which sounded noncardiac.  He will let me know if these recur or he has any increased symptoms.   DIZZINESS: He is not orthostatic.  This seems to have resolved.  It may have been related to his diverticulitis working in heat or outside 2 months.  No change in therapy.  HYPERLIPIDEMIA -  LDL was 56.  No change in therapy.    HYPERTENSION -  Blood pressures is at target.  No change in therapy.   DM  A1c was 6.8 which is slightly up from 6.4.  He will continue on the meds as listed.   CAROTID DISEASE This was mild bilaterally last year.  No further imaging.  I will follow this in a couple of years.  COVID EDUCATION: He did not want to get the vaccine but I have  talked to him about it and he might consider it.  Current medicines are reviewed at length with the patient today.  The patient does not have concerns regarding medicines.  The following changes have been made:  None  Labs/ tests ordered today include:    None   Orders Placed This Encounter  Procedures  . EKG 12-Lead     Disposition:   FU with me in in six months.   Signed, Minus Breeding, MD  10/12/2019 10:35 AM    [place]

## 2019-10-12 ENCOUNTER — Other Ambulatory Visit: Payer: Self-pay

## 2019-10-12 ENCOUNTER — Encounter: Payer: Self-pay | Admitting: Cardiology

## 2019-10-12 ENCOUNTER — Ambulatory Visit (INDEPENDENT_AMBULATORY_CARE_PROVIDER_SITE_OTHER): Payer: Medicare Other | Admitting: Cardiology

## 2019-10-12 VITALS — BP 137/83 | Temp 97.0°F | Ht 69.0 in | Wt 213.8 lb

## 2019-10-12 DIAGNOSIS — E118 Type 2 diabetes mellitus with unspecified complications: Secondary | ICD-10-CM | POA: Diagnosis not present

## 2019-10-12 DIAGNOSIS — I10 Essential (primary) hypertension: Secondary | ICD-10-CM | POA: Diagnosis not present

## 2019-10-12 DIAGNOSIS — E785 Hyperlipidemia, unspecified: Secondary | ICD-10-CM

## 2019-10-12 DIAGNOSIS — R42 Dizziness and giddiness: Secondary | ICD-10-CM | POA: Diagnosis not present

## 2019-10-12 DIAGNOSIS — I2510 Atherosclerotic heart disease of native coronary artery without angina pectoris: Secondary | ICD-10-CM | POA: Diagnosis not present

## 2019-10-12 DIAGNOSIS — I25810 Atherosclerosis of coronary artery bypass graft(s) without angina pectoris: Secondary | ICD-10-CM

## 2019-10-12 DIAGNOSIS — Z7189 Other specified counseling: Secondary | ICD-10-CM | POA: Diagnosis not present

## 2019-10-12 MED ORDER — ISOSORBIDE MONONITRATE ER 120 MG PO TB24: 120.0000 mg | ORAL_TABLET | Freq: Every day | ORAL | 3 refills | Status: DC

## 2019-10-12 NOTE — Patient Instructions (Addendum)
Medication Instructions:  DISCONTINUE IMDUR *If you need a refill on your cardiac medications before your next appointment, please call your pharmacy*  Lab Work: NONE ORDERED THIS VISIT  Testing/Procedures: NONE ORDERED THIS VISIT  Follow-Up: At [facility], you and your health needs are our priority.  As part of our continuing mission to provide you with exceptional heart care, we have created designated Provider Care Teams.  These Care Teams include your primary Cardiologist (physician) and Advanced Practice Providers (APPs -  Physician Assistants and Nurse Practitioners) who all work together to provide you with the care you need, when you need it.    Your next appointment:   6 month(s)   You will receive a reminder letter in the mail two months in advance. If you don't receive a letter, please call our office to schedule the follow-up appointment.  The format for your next appointment:   In Person  Provider:   Minus Breeding, MD

## 2019-10-24 ENCOUNTER — Other Ambulatory Visit: Payer: Self-pay

## 2019-10-24 ENCOUNTER — Telehealth: Payer: Self-pay

## 2019-10-24 ENCOUNTER — Other Ambulatory Visit (INDEPENDENT_AMBULATORY_CARE_PROVIDER_SITE_OTHER): Payer: Medicare Other

## 2019-10-24 ENCOUNTER — Telehealth: Payer: Self-pay | Admitting: Internal Medicine

## 2019-10-24 ENCOUNTER — Other Ambulatory Visit: Payer: Self-pay | Admitting: Internal Medicine

## 2019-10-24 DIAGNOSIS — E119 Type 2 diabetes mellitus without complications: Secondary | ICD-10-CM | POA: Diagnosis not present

## 2019-10-24 DIAGNOSIS — E291 Testicular hypofunction: Secondary | ICD-10-CM | POA: Diagnosis not present

## 2019-10-24 DIAGNOSIS — I10 Essential (primary) hypertension: Secondary | ICD-10-CM

## 2019-10-24 DIAGNOSIS — R079 Chest pain, unspecified: Secondary | ICD-10-CM

## 2019-10-24 LAB — HEMOGLOBIN A1C: Hgb A1c MFr Bld: 7 % — ABNORMAL HIGH (ref 4.6–6.5)

## 2019-10-24 LAB — COMPREHENSIVE METABOLIC PANEL
ALT: 24 U/L (ref 0–53)
AST: 21 U/L (ref 0–37)
Albumin: 4.3 g/dL (ref 3.5–5.2)
Alkaline Phosphatase: 44 U/L (ref 39–117)
BUN: 8 mg/dL (ref 6–23)
CO2: 28 mEq/L (ref 19–32)
Calcium: 9.1 mg/dL (ref 8.4–10.5)
Chloride: 104 mEq/L (ref 96–112)
Creatinine, Ser: 0.83 mg/dL (ref 0.40–1.50)
GFR: 91.11 mL/min (ref >60.00)
Glucose, Bld: 116 mg/dL — ABNORMAL HIGH (ref 70–99)
Potassium: 4.1 mEq/L (ref 3.5–5.1)
Sodium: 136 mEq/L (ref 135–145)
Total Bilirubin: 0.8 mg/dL (ref 0.2–1.2)
Total Protein: 6.3 g/dL (ref 6.0–8.3)

## 2019-10-24 LAB — CBC WITH DIFFERENTIAL/PLATELET
Basophils Absolute: 0 10*3/uL (ref 0.0–0.1)
Basophils Relative: 0.3 % (ref 0.0–3.0)
Eosinophils Absolute: 0 10*3/uL (ref 0.0–0.7)
Eosinophils Relative: 0.7 % (ref 0.0–5.0)
HCT: 41.4 % (ref 39.0–52.0)
Hemoglobin: 14.1 g/dL (ref 13.0–17.0)
Lymphocytes Relative: 20 % (ref 12.0–46.0)
Lymphs Abs: 1 10*3/uL (ref 0.7–4.0)
MCHC: 34.1 g/dL (ref 30.0–36.0)
MCV: 91.3 fl (ref 78.0–100.0)
Monocytes Absolute: 0.4 10*3/uL (ref 0.1–1.0)
Monocytes Relative: 7.9 % (ref 3.0–12.0)
Neutro Abs: 3.5 10*3/uL (ref 1.4–7.7)
Neutrophils Relative %: 71.1 % (ref 43.0–77.0)
Platelets: 142 10*3/uL — ABNORMAL LOW (ref 150.0–400.0)
RBC: 4.54 Mil/uL (ref 4.22–5.81)
RDW: 14.5 % (ref 11.5–15.5)
WBC: 5 10*3/uL (ref 4.0–10.5)

## 2019-10-24 LAB — LIPID PANEL
Cholesterol: 150 mg/dL (ref 0–200)
HDL: 43.7 mg/dL (ref >39.00)
NonHDL: 106.53
Total CHOL/HDL Ratio: 3
Triglycerides: 224 mg/dL — ABNORMAL HIGH (ref 0.0–149.0)
VLDL: 44.8 mg/dL — ABNORMAL HIGH (ref 0.0–40.0)

## 2019-10-24 LAB — TROPONIN I (HIGH SENSITIVITY): High Sens Troponin I: 6 ng/L (ref 2–17)

## 2019-10-24 LAB — LDL CHOLESTEROL, DIRECT: Direct LDL: 74 mg/dL

## 2019-10-24 NOTE — Telephone Encounter (Signed)
Spoke with patient. Yesterday patient had tightness in his chest and shortness of breath. He took nitroglycerin and his blood pressure dropped to 90/45. Chest tightness relieved after nitro. Patient complained of fatigue. Blood pressure back up to 127/72 last night.   Patient went to PCP this morning because he thinks he may be anemic due to fatigue and hypotension. He asked PCP to draw blood work to check his blood counts. He reports the nurse at the office told the MD he was having chest pain and they decided to draw Troponins. He wants Dr. [name] to know if he has chest pain he will go to the ER but he is not having active chest pain and just wanted anemia assessed. Patient refused cardiology follow up with an APP.

## 2019-10-24 NOTE — Addendum Note (Signed)
Addended by: Tor Netters I on: 10/24/2019 09:49 AM   Modules accepted: Orders

## 2019-10-24 NOTE — Telephone Encounter (Signed)
We don't draw trop as an out patient if there is suspected acute coronary syndrome.  He would need to be seen in the ED for this evaluation.  If he is not having symptoms he can be scheduled to see an APP.

## 2019-10-24 NOTE — Telephone Encounter (Signed)
Patient would like testosterone and troponin labs added to lab order. He stated he will pay for it regardless.

## 2019-10-24 NOTE — Telephone Encounter (Signed)
rec f/u cards for CP denies active CP today Pt wanted troponin labs

## 2019-10-24 NOTE — Addendum Note (Signed)
Addended by: Tor Netters I on: 10/24/2019 10:02 AM   Modules accepted: Orders

## 2019-10-25 LAB — TESTOSTERONE TOTAL,FREE,BIO, MALES
Albumin: 4.3 g/dL (ref 3.6–5.1)
Sex Hormone Binding: 48 nmol/L (ref 22–77)
Testosterone, Bioavailable: 79 ng/dL (ref 15.0–150.0)
Testosterone, Free: 40.1 pg/mL (ref 6.0–73.0)
Testosterone: 417 ng/dL (ref 250–827)

## 2019-11-02 ENCOUNTER — Ambulatory Visit (INDEPENDENT_AMBULATORY_CARE_PROVIDER_SITE_OTHER): Payer: Medicare Other | Admitting: Physician Assistant

## 2019-11-02 ENCOUNTER — Encounter: Payer: Self-pay | Admitting: Physician Assistant

## 2019-11-02 ENCOUNTER — Other Ambulatory Visit: Payer: Self-pay

## 2019-11-02 ENCOUNTER — Encounter: Payer: Self-pay | Admitting: Cardiology

## 2019-11-02 VITALS — BP 130/80 | HR 72 | Temp 97.7°F | Ht 68.0 in | Wt 212.0 lb

## 2019-11-02 DIAGNOSIS — I959 Hypotension, unspecified: Secondary | ICD-10-CM | POA: Diagnosis not present

## 2019-11-02 DIAGNOSIS — R6884 Jaw pain: Secondary | ICD-10-CM

## 2019-11-02 DIAGNOSIS — I25810 Atherosclerosis of coronary artery bypass graft(s) without angina pectoris: Secondary | ICD-10-CM | POA: Diagnosis not present

## 2019-11-02 DIAGNOSIS — E785 Hyperlipidemia, unspecified: Secondary | ICD-10-CM | POA: Diagnosis not present

## 2019-11-02 DIAGNOSIS — I10 Essential (primary) hypertension: Secondary | ICD-10-CM | POA: Diagnosis not present

## 2019-11-02 DIAGNOSIS — I25709 Atherosclerosis of coronary artery bypass graft(s), unspecified, with unspecified angina pectoris: Secondary | ICD-10-CM | POA: Diagnosis not present

## 2019-11-02 DIAGNOSIS — E119 Type 2 diabetes mellitus without complications: Secondary | ICD-10-CM

## 2019-11-02 DIAGNOSIS — R079 Chest pain, unspecified: Secondary | ICD-10-CM | POA: Diagnosis not present

## 2019-11-02 NOTE — Progress Notes (Signed)
Cardiology Office Note:    Date:  11/04/2019   ID:  Kevin Webb, DOB 03-Apr-1946, MRN [id number]  PCP:  McLean-Scocuzza, Nino Glow, MD  [facility] Cardiologist:  Minus Breeding, MD  [facility] Electrophysiologist:  None   Referring MD: McLean-Scocuzza, Olivia Mackie *   Chief Complaint  Patient presents with  . Follow-up    seen for Dr. [name].    History of Present Illness:    Kevin Webb is a 74 y.o. male with a hx of CAD s/p CABG x 4 (LIMA-LAD, reverse SVG-OM, reverse SVG-acute marginal and reverse SVG-RCA in 2000), hypertension, hyperlipidemia, diet-controlled DM 2 and history of carotid artery disease.  Patient had BMS to SVG to RI in 2012. He had a NSTEMI in August 2018 and underwent a repeat cardiac catheterization which revealed severe native disease, patent LIMA to LAD with 75% stenosis in SVG to RCA, 90% stenosis in SVG to RI.  He underwent a successful DES placement to both lesion.  After the procedure, he was placed on aspirin and Brilinta.  Patient was recently seen by Dr. [name] at which time he was doing well.  He presented today for your further evaluation of low blood pressure and recurrent chest tightness and jaw pain.  I reviewed his blood pressure diary, there were several incidents of low blood pressure a week ago where his systolic blood pressure dipped down to the 90s.  This typically occur in the morning.  Talking with the patient, he wake up multiple times at night to urinate, therefore he usually take his medication around 2 AM in the morning.  He is on 25 mg daily of Toprol-XL and 10 mg daily of ramipril.  I recommended to move the Toprol-XL to afternoon to space out the 2 blood pressure medication.  I do see on his blood pressure diary that sometimes his blood pressure will jump up to the 160s range.  I asked him to continue to monitor his blood pressure, if he continued to have intermittent hypotension, I would recommend decreasing ramipril to 5 mg every  morning.  Otherwise, recently he has also been noticing intermittent chest tightness and jaw pain.  Exertional jaw pain was his previous anginal symptom.  However recent symptom does not have clear correlation with the degree of exertion.  He may notice symptom when walking on flat ground for 100 yard however does not notice the symptom when he walks on the treadmill for 10 minutes.  I recommended a Lexiscan Myoview to further assess.  If Myoview was normal, he can follow-up with Dr. [name] in 3 months.  If it is abnormal, we will try to bring him back earlier.   Past Medical History:  Diagnosis Date  . Acute cholecystitis 04/18/2013  . Adenomatous colon polyp 12/2004  . Anemia   . Blood transfusion without reported diagnosis   . BPH associated with nocturia   . CAD (coronary artery disease)    a. s/p CABG x4 in 2000 with LIMA-LAD, reverse SVG-IM, reverse SVG-acute margin and reverse SVG-RCA b. s/p BMS to SVG-RI in 2012 c. 12/2016: PCI/DES to SVG-RCA and PCI/DES to SVG-RI (Dr. [name])   . Carotid artery stenosis    mild   . Cataract   . Clotting disorder ([place])   . Diabetes mellitus without complication ([place]) 65/78/4696   recent A1C 6.1 12/25/16 controlled with diet and exercise   . Diverticulitis 2008/2009  . GERD (gastroesophageal reflux disease)    controlled as of 04/01/17   .  HSV infection   . HTN (hypertension)   . Hx of dysplastic nevus 05/04/2018   upper back spinal   . Hyperglycemia   . Hyperlipidemia   . Impingement syndrome of left shoulder 2016  . Low testosterone    Dr [name], [place] ,[place]  . Myocardial infarction ([place])   . Osteoarthritis of right knee    With trace effusion  . Peyronie disease   . RLS (restless legs syndrome) 08/15/2013  . Sinus bradycardia   . Thrombocytopenia ([place])   . Vitamin D deficiency     Past Surgical History:  Procedure Laterality Date  . CHOLECYSTECTOMY N/A 04/21/2013   Procedure: LAPAROSCOPIC CHOLECYSTECTOMY WITH INTRAOPERATIVE  CHOLANGIOGRAM;  Surgeon: Haywood Lasso, MD;  Location: [facility];  Service: General;  Laterality: N/A;  . CHOLECYSTECTOMY    . COLONOSCOPY W/ POLYPECTOMY  2004 & 2009    X 2; Dr Fuller Plan; due 2014  . CORONARY ANGIOPLASTY    . CORONARY ARTERY BYPASS GRAFT  04/1999   4 vessels  . CORONARY STENT INTERVENTION N/A 12/28/2016   Procedure: CORONARY STENT INTERVENTION;  Surgeon: Lorretta Harp, MD;  Location: [facility];  Service: Cardiovascular;  Laterality: N/A;  . CORONARY STENT PLACEMENT  2012   Plavix  . LAPAROSCOPIC CHOLECYSTECTOMY  04/21/2013   Dr Margot Chimes; acutecholecystitis with necrosis  . LEFT HEART CATH AND CORS/GRAFTS ANGIOGRAPHY N/A 12/28/2016   Procedure: LEFT HEART CATH AND CORS/GRAFTS ANGIOGRAPHY;  Surgeon: Lorretta Harp, MD;  Location: [facility];  Service: Cardiovascular;  Laterality: N/A;  . VASECTOMY      Current Medications: Current Meds  Medication Sig  . amLODipine (NORVASC) 5 MG tablet TAKE ONE (1) TABLET EACH DAY  . Ascorbic Acid (VITAMIN C) 1000 MG tablet Take 1,000 mg by mouth daily. Reported on 09/26/2015  . aspirin 81 MG tablet Take 81 mg by mouth daily.  . Cholecalciferol (VITAMIN D3) 5000 UNITS CAPS Take by mouth daily. Alternates 10K IU and 15 K IU D3 was on 5K IU qd and vitamin D still low will repeat as of 06/20/19  . ciprofloxacin (CIPRO) 500 MG tablet Take 1 tablet (500 mg total) by mouth 2 (two) times daily.  . clopidogrel (PLAVIX) 75 MG tablet Take 1 tablet (75 mg total) by mouth daily.  Marland Kitchen co-enzyme Q-10 30 MG capsule Take 30 mg by mouth daily.   . cyclobenzaprine (FLEXERIL) 5 MG tablet Take 1 tablet (5 mg total) by mouth 3 (three) times daily as needed for muscle spasms. (Patient taking differently: Take 5 mg by mouth as needed for muscle spasms. )  . ezetimibe (ZETIA) 10 MG tablet Take 1 tablet (10 mg total) by mouth daily.  Marland Kitchen gabapentin (NEURONTIN) 100 MG capsule Take 100 mg by mouth 2 (two) times daily as needed.  Marland Kitchen GINKGO BILOBA PO Take  1 capsule by mouth daily.  . metoprolol succinate (TOPROL XL) 25 MG 24 hr tablet Take 1 tablet (25 mg total) by mouth daily.  . metroNIDAZOLE (FLAGYL) 500 MG tablet Take 1 tablet (500 mg total) by mouth 3 (three) times daily. With food  . Niacin CR 1000 MG TBCR niacin ER 1,000 mg tablet,extended release 24 hr  . nitroGLYCERIN (NITROSTAT) 0.4 MG SL tablet Place 1 tablet (0.4 mg total) under the tongue every 5 (five) minutes x 3 doses as needed.  . Omega-3 Fatty Acids (FISH OIL) 1000 MG CAPS 1 tab po qd   . pantoprazole (PROTONIX) 40 MG tablet Take 1 tablet (40  mg total) by mouth 2 (two) times daily before a meal. 30 minutes. Try to reduce to 1x per day now or in 3 months  . ramipril (ALTACE) 10 MG capsule Take 1 capsule (10 mg total) by mouth daily.  . rosuvastatin (CRESTOR) 40 MG tablet Take 1 tablet (40 mg total) by mouth at bedtime.  . sucralfate (CARAFATE) 1 g tablet Take 1 tablet (1 g total) by mouth 4 (four) times daily -  with meals and at bedtime.  . tamsulosin (FLOMAX) 0.4 MG CAPS capsule Take 0.4 mg by mouth.  . vitamin E 100 UNIT capsule Take 100 Units by mouth daily.       Allergies:   Darvocet [propoxyphene n-acetaminophen] and Propoxyphene   Social History   Socioeconomic History  . Marital status: Married    Spouse name: Not on file  . Number of children: 1  . Years of education: Not on file  . Highest education level: Not on file  Occupational History  . Occupation:      Comment: plumber  Tobacco Use  . Smoking status: Never Smoker  . Smokeless tobacco: Never Used  Substance and Sexual Activity  . Alcohol use: Yes    Alcohol/week: 10.0 - 12.0 standard drinks    Types: 10 - 12 Cans of beer per week    Comment: socially   . Drug use: No  . Sexual activity: Not on file  Other Topics Concern  . Not on file  Social History Narrative   Remarried 2014   1 son age 39 as of 03/2017    Social Determinants of Health   Financial Resource Strain:   . Difficulty of  Paying Living Expenses:   Food Insecurity:   . Worried About Charity fundraiser in the Last Year:   . Arboriculturist in the Last Year:   Transportation Needs:   . Film/video editor (Medical):   Marland Kitchen Lack of Transportation (Non-Medical):   Physical Activity: Sufficiently Active  . Days of Exercise per Week: 7 days  . Minutes of Exercise per Session: 60 min  Stress:   . Feeling of Stress :   Social Connections:   . Frequency of Communication with Friends and Family:   . Frequency of Social Gatherings with Friends and Family:   . Attends Religious Services:   . Active Member of Clubs or Organizations:   . Attends Archivist Meetings:   Marland Kitchen Marital Status:      Family History: The patient's family history includes Aneurysm in his mother; Aortic aneurysm (age of onset: 42) in his father; CVA in his mother; Cholecystitis in his sister; Coronary artery disease in his mother; Diabetes in his brother and another family member; Heart attack (age of onset: 76) in his father. There is no history of Cancer, Colon cancer, Prostate cancer, Esophageal cancer, Stomach cancer, or Rectal cancer.  ROS:   Please see the history of present illness.     All other systems reviewed and are negative.  EKGs/Labs/Other Studies Reviewed:    The following studies were reviewed today:  Cath 12/28/2016  Prox RCA to Mid RCA lesion, 100 %stenosed.  SVG.  Ost LAD lesion, 100 %stenosed.  Ost Ramus lesion, 100 %stenosed.  LIMA and is normal in caliber.  SVG.  Prox Graft to Mid Graft lesion, 90 %stenosed.  Post intervention, there is a 0% residual stenosis.  A stent was successfully placed.  Mid Graft lesion, 75 %stenosed.  Post intervention, there  is a 0% residual stenosis.  A stent was successfully placed.  The left ventricular systolic function is normal.  LV end diastolic pressure is normal.  The left ventricular ejection fraction is 55-65% by visual estimate.   EKG:  EKG is  ordered today.  The ekg ordered today demonstrates atrial fibrillation, RBBB  Recent Labs: 07/18/2019: TSH 1.40 10/24/2019: ALT 24; BUN 8; Creatinine, Ser 0.83; Hemoglobin 14.1; Platelets 142.0; Potassium 4.1; Sodium 136  Recent Lipid Panel    Component Value Date/Time   CHOL 150 10/24/2019 0912   TRIG 224.0 (H) 10/24/2019 0912   HDL 43.70 10/24/2019 0912   CHOLHDL 3 10/24/2019 0912   VLDL 44.8 (H) 10/24/2019 0912   LDLCALC 56 07/18/2019 0956   LDLDIRECT 74.0 10/24/2019 0912    Physical Exam:    VS:  BP 130/80   Pulse 72   Temp 97.7 F (36.5 C)   Ht 5\' 8"  (1.727 m)   Wt 212 lb (96.2 kg)   SpO2 96%   BMI 32.23 kg/m     Wt Readings from Last 3 Encounters:  11/02/19 212 lb (96.2 kg)  10/12/19 213 lb 12.8 oz (97 kg)  09/22/19 214 lb (97.1 kg)     GEN:  Well nourished, well developed in no acute distress HEENT: Normal NECK: No JVD; No carotid bruits LYMPHATICS: No lymphadenopathy CARDIAC: RRR, no murmurs, rubs, gallops RESPIRATORY:  Clear to auscultation without rales, wheezing or rhonchi  ABDOMEN: Soft, non-tender, non-distended MUSCULOSKELETAL:  No edema; No deformity  SKIN: Warm and dry NEUROLOGIC:  Alert and oriented x 3 PSYCHIATRIC:  Normal affect   ASSESSMENT:    1. Chest pain, unspecified type   2. Jaw pain   3. Coronary artery disease involving coronary bypass graft of native heart with angina pectoris ([place])   4. Essential hypertension   5. Hyperlipidemia LDL goal <70   6. Controlled type 2 diabetes mellitus without complication, without long-term current use of insulin ([place])   7. Hypotension, unspecified hypotension type    PLAN:    In order of problems listed above:  1. Chest pain and jaw pain: Recent symptom was concerning, however symptom does not have a direct correlation with exertion as sometimes he can walk on the treadmill for several minutes without the symptom.  I recommended a Lexiscan Myoview  2. CAD s/p CABG: See #1.  Continue aspirin and  Plavix.  3. Hypertension: He has been having intermittent low blood pressure recently, I recommended keep his ramipril in the morning however moved Toprol-XL to the afternoon.  If continue to have intermittent hypotension, I will begin to decrease the ramipril dosage.  4. Hyperlipidemia: On Zetia and Crestor  5. DM2: Managed by primary care provider  6. Intermittent hypotension: See #3.  Will adjust schedule of his medication first, if continue to have intermittent hypotension, will plan to decrease ramipril dosage.   Medication Adjustments/Labs and Tests Ordered: Current medicines are reviewed at length with the patient today.  Concerns regarding medicines are outlined above.  Orders Placed This Encounter  Procedures  . MYOCARDIAL PERFUSION IMAGING  . EKG 12-Lead   No orders of the defined types were placed in this encounter.   Patient Instructions  Medication Instructions:  TAKE METOPROLOL EARLY AFTERNOON (ABOUT 2PM)  TAKE RAMIPRIL EARLY AM *If you need a refill on your cardiac medications before your next appointment, please call your pharmacy*  Special Instructions MONITOR BLOOD PRESSURE IF THIS CONTINUES TO BE IN THE 90's OK TO TAKE 1/2  OF RAMIPRIL THEN CONTINUE TO MONITOR. CALL IF ANY QUESTIONS.  Your physician has requested that you have a lexiscan myoview. A cardiac stress test is a cardiological test that measures the heart's ability to respond to external stress in a controlled clinical environment. The stress response is induced by intravenous pharmacological stimulation.    Follow-Up: Your next appointment:  3 month(s)  In Person with Minus Breeding, MD  At [facility], you and your health needs are our priority.  As part of our continuing mission to provide you with exceptional heart care, we have created designated Provider Care Teams.  These Care Teams include your primary Cardiologist (physician) and Advanced Practice Providers (APPs -  Physician Assistants and  Nurse Practitioners) who all work together to provide you with the care you need, when you need it.  We recommend signing up for the patient portal called "MyChart".  Sign up information is provided on this After Visit Summary.  MyChart is used to connect with patients for Virtual Visits (Telemedicine).  Patients are able to view lab/test results, encounter notes, upcoming appointments, etc.  Non-urgent messages can be sent to your provider as well.   To learn more about what you can do with MyChart, go to [email or web address].       Hilbert Corrigan, [place]  11/04/2019 11:31 PM    [facility]

## 2019-11-02 NOTE — Patient Instructions (Signed)
Medication Instructions:  TAKE METOPROLOL EARLY AFTERNOON (ABOUT 2PM)  TAKE RAMIPRIL EARLY AM *If you need a refill on your cardiac medications before your next appointment, please call your pharmacy*  Special Instructions MONITOR BLOOD PRESSURE IF THIS CONTINUES TO BE IN THE 90's OK TO TAKE 1/2 OF RAMIPRIL THEN CONTINUE TO MONITOR. CALL IF ANY QUESTIONS.  Your physician has requested that you have a lexiscan myoview. A cardiac stress test is a cardiological test that measures the heart's ability to respond to external stress in a controlled clinical environment. The stress response is induced by intravenous pharmacological stimulation.    Follow-Up: Your next appointment:  3 month(s)  In Person with Minus Breeding, MD  At [facility], you and your health needs are our priority.  As part of our continuing mission to provide you with exceptional heart care, we have created designated Provider Care Teams.  These Care Teams include your primary Cardiologist (physician) and Advanced Practice Providers (APPs -  Physician Assistants and Nurse Practitioners) who all work together to provide you with the care you need, when you need it.  We recommend signing up for the patient portal called "MyChart".  Sign up information is provided on this After Visit Summary.  MyChart is used to connect with patients for Virtual Visits (Telemedicine).  Patients are able to view lab/test results, encounter notes, upcoming appointments, etc.  Non-urgent messages can be sent to your provider as well.   To learn more about what you can do with MyChart, go to [email or web address].

## 2019-11-04 ENCOUNTER — Encounter: Payer: Self-pay | Admitting: Physician Assistant

## 2019-11-09 ENCOUNTER — Telehealth (HOSPITAL_COMMUNITY): Payer: Self-pay

## 2019-11-09 NOTE — Telephone Encounter (Signed)
Encounter complete. 

## 2019-11-14 ENCOUNTER — Other Ambulatory Visit: Payer: Self-pay

## 2019-11-14 ENCOUNTER — Ambulatory Visit (HOSPITAL_COMMUNITY)
Admission: RE | Admit: 2019-11-14 | Discharge: 2019-11-14 | Disposition: A | Discharge status: Home / Self Care | Payer: Medicare Other | Source: Ambulatory Visit | Attending: Cardiovascular Disease | Admitting: Cardiovascular Disease

## 2019-11-14 DIAGNOSIS — R079 Chest pain, unspecified: Secondary | ICD-10-CM | POA: Diagnosis present

## 2019-11-14 DIAGNOSIS — R6884 Jaw pain: Secondary | ICD-10-CM | POA: Diagnosis present

## 2019-11-14 LAB — MYOCARDIAL PERFUSION IMAGING
LV dias vol: 114 mL (ref 62–150)
LV sys vol: 57 mL
Peak HR: 90 {beats}/min
Rest HR: 56 {beats}/min
SDS: 2
SRS: 5
SSS: 7
TID: 1.47

## 2019-11-14 MED ORDER — TECHNETIUM TC 99M TETROFOSMIN IV KIT
10.7000 | PACK | Freq: Once | INTRAVENOUS | Status: AC | PRN
  Administered 2019-11-14: 10.7 via INTRAVENOUS
  Filled 2019-11-14: qty 11

## 2019-11-14 MED ORDER — TECHNETIUM TC 99M TETROFOSMIN IV KIT
30.1000 | PACK | Freq: Once | INTRAVENOUS | Status: AC | PRN
  Administered 2019-11-14: 30.1 via INTRAVENOUS
  Filled 2019-11-14: qty 31

## 2019-11-14 MED ORDER — REGADENOSON 0.4 MG/5ML IV SOLN
0.4000 mg | Freq: Once | INTRAVENOUS | Status: AC
Start: 2019-11-14 — End: 2019-11-14
  Administered 2019-11-14: 0.4 mg via INTRAVENOUS

## 2019-11-16 ENCOUNTER — Ambulatory Visit: Payer: Medicare Other

## 2019-11-17 ENCOUNTER — Ambulatory Visit: Payer: Medicare Other | Admitting: Cardiology

## 2019-11-22 ENCOUNTER — Ambulatory Visit (INDEPENDENT_AMBULATORY_CARE_PROVIDER_SITE_OTHER): Payer: Medicare Other

## 2019-11-22 VITALS — BP 131/71 | HR 60 | Ht 68.0 in | Wt 212.0 lb

## 2019-11-22 DIAGNOSIS — Z0000 Encounter for general adult medical examination without abnormal findings: Secondary | ICD-10-CM

## 2019-11-22 NOTE — Patient Instructions (Addendum)
Kevin Webb , Thank you for taking time to come for your Medicare Wellness Visit. I appreciate your ongoing commitment to your health goals. Please review the following plan we discussed and let me know if I can assist you in the future.   These are the goals we discussed: Goals      Patient Stated   .  Follow up with Primary Care Provider as needed (pt-stated)      Modify exercise regimen       This is a list of the screening recommended for you and due dates:  Health Maintenance  Topic Date Due  . COVID-19 Vaccine (1) Never done  . Eye exam for diabetics  07/28/2018  . Tetanus Vaccine  08/08/2018  . Flu Shot  12/17/2019  . Hemoglobin A1C  04/24/2020  . Colon Cancer Screening  05/01/2020  .  Hepatitis C: One time screening is recommended by Center for Disease Control  (CDC) for  adults born from [birth year] through 1963.   Completed  . Pneumonia vaccines  Completed  . Complete foot exam   Discontinued    Immunizations Immunization History  Administered Date(s) Administered  . Influenza, High Dose Seasonal PF 01/25/2015, 03/30/2016, 04/01/2017, 03/29/2018  . Influenza,inj,Quad PF,6+ Mos 04/25/2014  . Influenza,inj,quad, With Preservative 02/15/2018  . Influenza-Unspecified 03/19/2019  . Pneumococcal Conjugate-13 09/27/2015  . Pneumococcal Polysaccharide-23 04/25/2014  . Td 08/07/2008  . Zoster 03/21/2014   Keep all routine maintenance appointments.   Next scheduled fasting lab 01/23/20 @ 9:15  Follow up 03/26/20 @ 1:30  Advanced directives: yes on file  Follow up in one year for your annual wellness visit.   Preventive Care 62 Years and Older, Male Preventive care refers to lifestyle choices and visits with your health care provider that can promote health and wellness. What does preventive care include?  A yearly physical exam. This is also called an annual well check.  Dental exams once or twice a year.  Routine eye exams. Ask your health care provider how often you  should have your eyes checked.  Personal lifestyle choices, including:  Daily care of your teeth and gums.  Regular physical activity.  Eating a healthy diet.  Avoiding tobacco and drug use.  Limiting alcohol use.  Practicing safe sex.  Taking low doses of aspirin every day.  Taking vitamin and mineral supplements as recommended by your health care provider. What happens during an annual well check? The services and screenings done by your health care provider during your annual well check will depend on your age, overall health, lifestyle risk factors, and family history of disease. Counseling  Your health care provider may ask you questions about your:  Alcohol use.  Tobacco use.  Drug use.  Emotional well-being.  Home and relationship well-being.  Sexual activity.  Eating habits.  History of falls.  Memory and ability to understand (cognition).  Work and work Statistician. Screening  You may have the following tests or measurements:  Height, weight, and BMI.  Blood pressure.  Lipid and cholesterol levels. These may be checked every 5 years, or more frequently if you are over 67 years old.  Skin check.  Lung cancer screening. You may have this screening every year starting at age 61 if you have a 30-pack-year history of smoking and currently smoke or have quit within the past 15 years.  Fecal occult blood test (FOBT) of the stool. You may have this test every year starting at age 42.  Flexible sigmoidoscopy or  colonoscopy. You may have a sigmoidoscopy every 5 years or a colonoscopy every 10 years starting at age 66.  Prostate cancer screening. Recommendations will vary depending on your family history and other risks.  Hepatitis C blood test.  Hepatitis B blood test.  Sexually transmitted disease (STD) testing.  Diabetes screening. This is done by checking your blood sugar (glucose) after you have not eaten for a while (fasting). You may have this  done every 1-3 years.  Abdominal aortic aneurysm (AAA) screening. You may need this if you are a current or former smoker.  Osteoporosis. You may be screened starting at age 59 if you are at high risk. Talk with your health care provider about your test results, treatment options, and if necessary, the need for more tests. Vaccines  Your health care provider may recommend certain vaccines, such as:  Influenza vaccine. This is recommended every year.  Tetanus, diphtheria, and acellular pertussis (Tdap, Td) vaccine. You may need a Td booster every 10 years.  Zoster vaccine. You may need this after age 23.  Pneumococcal 13-valent conjugate (PCV13) vaccine. One dose is recommended after age 81.  Pneumococcal polysaccharide (PPSV23) vaccine. One dose is recommended after age 65. Talk to your health care provider about which screenings and vaccines you need and how often you need them. This information is not intended to replace advice given to you by your health care provider. Make sure you discuss any questions you have with your health care provider. Document Released: 05/31/2015 Document Revised: 01/22/2016 Document Reviewed: 03/05/2015 Elsevier Interactive Patient Education  2017 [employer] Prevention in the Home Falls can cause injuries. They can happen to people of all ages. There are many things you can do to make your home safe and to help prevent falls. What can I do on the outside of my home?  Regularly fix the edges of walkways and driveways and fix any cracks.  Remove anything that might make you trip as you walk through a door, such as a raised step or threshold.  Trim any bushes or trees on the path to your home.  Use bright outdoor lighting.  Clear any walking paths of anything that might make someone trip, such as rocks or tools.  Regularly check to see if handrails are loose or broken. Make sure that both sides of any steps have handrails.  Any raised  decks and porches should have guardrails on the edges.  Have any leaves, snow, or ice cleared regularly.  Use sand or salt on walking paths during winter.  Clean up any spills in your garage right away. This includes oil or grease spills. What can I do in the bathroom?  Use night lights.  Install grab bars by the toilet and in the tub and shower. Do not use towel bars as grab bars.  Use non-skid mats or decals in the tub or shower.  If you need to sit down in the shower, use a plastic, non-slip stool.  Keep the floor dry. Clean up any water that spills on the floor as soon as it happens.  Remove soap buildup in the tub or shower regularly.  Attach bath mats securely with double-sided non-slip rug tape.  Do not have throw rugs and other things on the floor that can make you trip. What can I do in the bedroom?  Use night lights.  Make sure that you have a light by your bed that is easy to reach.  Do not use any  sheets or blankets that are too big for your bed. They should not hang down onto the floor.  Have a firm chair that has side arms. You can use this for support while you get dressed.  Do not have throw rugs and other things on the floor that can make you trip. What can I do in the kitchen?  Clean up any spills right away.  Avoid walking on wet floors.  Keep items that you use a lot in easy-to-reach places.  If you need to reach something above you, use a strong step stool that has a grab bar.  Keep electrical cords out of the way.  Do not use floor polish or wax that makes floors slippery. If you must use wax, use non-skid floor wax.  Do not have throw rugs and other things on the floor that can make you trip. What can I do with my stairs?  Do not leave any items on the stairs.  Make sure that there are handrails on both sides of the stairs and use them. Fix handrails that are broken or loose. Make sure that handrails are as long as the stairways.  Check  any carpeting to make sure that it is firmly attached to the stairs. Fix any carpet that is loose or worn.  Avoid having throw rugs at the top or bottom of the stairs. If you do have throw rugs, attach them to the floor with carpet tape.  Make sure that you have a light switch at the top of the stairs and the bottom of the stairs. If you do not have them, ask someone to add them for you. What else can I do to help prevent falls?  Wear shoes that:  Do not have high heels.  Have rubber bottoms.  Are comfortable and fit you well.  Are closed at the toe. Do not wear sandals.  If you use a stepladder:  Make sure that it is fully opened. Do not climb a closed stepladder.  Make sure that both sides of the stepladder are locked into place.  Ask someone to hold it for you, if possible.  Clearly mark and make sure that you can see:  Any grab bars or handrails.  First and last steps.  Where the edge of each step is.  Use tools that help you move around (mobility aids) if they are needed. These include:  Canes.  Walkers.  Scooters.  Crutches.  Turn on the lights when you go into a dark area. Replace any light bulbs as soon as they burn out.  Set up your furniture so you have a clear path. Avoid moving your furniture around.  If any of your floors are uneven, fix them.  If there are any pets around you, be aware of where they are.  Review your medicines with your doctor. Some medicines can make you feel dizzy. This can increase your chance of falling. Ask your doctor what other things that you can do to help prevent falls. This information is not intended to replace advice given to you by your health care provider. Make sure you discuss any questions you have with your health care provider. Document Released: 02/28/2009 Document Revised: 10/10/2015 Document Reviewed: 06/08/2014 Elsevier Interactive Patient Education  2017 [employer].

## 2019-11-22 NOTE — Progress Notes (Signed)
Subjective:   Kevin Webb is a 74 y.o. male who presents for Medicare Annual/Subsequent preventive examination.  Review of Systems    No ROS.  Medicare Wellness Virtual Visit.   Cardiac Risk Factors include: advanced age (>75men, >88 women);hypertension;diabetes mellitus     Objective:    Today's Vitals   11/22/19 1337  BP: 131/71  Pulse: 60  Weight: 212 lb (96.2 kg)  Height: 5\' 8"  (1.727 m)   Body mass index is 32.23 kg/m.  Advanced Directives 11/22/2019 11/15/2018 11/11/2017 04/28/2017 12/26/2016 12/25/2016 09/28/2016  Does Patient Have a Medical Advance Directive? Yes Yes Yes Yes Yes No Yes  Type of Paramedic of [place];Living will Living will;Healthcare Power of [place];Living will Living will Living will;Healthcare Power of [place];Living will  Does patient want to make changes to medical advance directive? No - Patient declined No - Patient declined No - Patient declined - No - Patient declined - No - Patient declined  Copy of [place] in Chart? Yes - validated most recent copy scanned in chart (See row information) Yes - validated most recent copy scanned in chart (See row information) Yes - No - copy requested - No - copy requested  Would patient like information on creating a medical advance directive? - - - Yes (ED - Information included in AVS) No - Patient declined - -    Current Medications (verified) Outpatient Encounter Medications as of 11/22/2019  Medication Sig   amLODipine (NORVASC) 5 MG tablet TAKE ONE (1) TABLET EACH DAY   Ascorbic Acid (VITAMIN C) 1000 MG tablet Take 1,000 mg by mouth daily. Reported on 09/26/2015   aspirin 81 MG tablet Take 81 mg by mouth daily.   Cholecalciferol (VITAMIN D3) 5000 UNITS CAPS Take by mouth daily. Alternates 10K IU and 15 K IU D3 was on 5K IU qd and vitamin D still low will repeat as of 06/20/19   ciprofloxacin (CIPRO)  500 MG tablet Take 1 tablet (500 mg total) by mouth 2 (two) times daily.   clopidogrel (PLAVIX) 75 MG tablet Take 1 tablet (75 mg total) by mouth daily.   co-enzyme Q-10 30 MG capsule Take 30 mg by mouth daily.    cyclobenzaprine (FLEXERIL) 5 MG tablet Take 1 tablet (5 mg total) by mouth 3 (three) times daily as needed for muscle spasms. (Patient taking differently: Take 5 mg by mouth as needed for muscle spasms. )   ezetimibe (ZETIA) 10 MG tablet Take 1 tablet (10 mg total) by mouth daily.   gabapentin (NEURONTIN) 100 MG capsule Take 100 mg by mouth 2 (two) times daily as needed.   GINKGO BILOBA PO Take 1 capsule by mouth daily.   metoprolol succinate (TOPROL XL) 25 MG 24 hr tablet Take 1 tablet (25 mg total) by mouth daily.   metroNIDAZOLE (FLAGYL) 500 MG tablet Take 1 tablet (500 mg total) by mouth 3 (three) times daily. With food   Niacin CR 1000 MG TBCR niacin ER 1,000 mg tablet,extended release 24 hr   nitroGLYCERIN (NITROSTAT) 0.4 MG SL tablet Place 1 tablet (0.4 mg total) under the tongue every 5 (five) minutes x 3 doses as needed.   Omega-3 Fatty Acids (FISH OIL) 1000 MG CAPS 1 tab po qd    pantoprazole (PROTONIX) 40 MG tablet Take 1 tablet (40 mg total) by mouth 2 (two) times daily before a meal. 30 minutes. Try to reduce to 1x per  day now or in 3 months   ramipril (ALTACE) 10 MG capsule Take 1 capsule (10 mg total) by mouth daily.   rosuvastatin (CRESTOR) 40 MG tablet Take 1 tablet (40 mg total) by mouth at bedtime.   sucralfate (CARAFATE) 1 g tablet Take 1 tablet (1 g total) by mouth 4 (four) times daily -  with meals and at bedtime.   tamsulosin (FLOMAX) 0.4 MG CAPS capsule Take 0.4 mg by mouth.   vitamin E 100 UNIT capsule Take 100 Units by mouth daily.     No facility-administered encounter medications on file as of 11/22/2019.    Allergies (verified) Darvocet [propoxyphene n-acetaminophen] and Propoxyphene   History: Past Medical History:  Diagnosis Date    Acute cholecystitis 04/18/2013   Adenomatous colon polyp 12/2004   Anemia    Blood transfusion without reported diagnosis    BPH associated with nocturia    CAD (coronary artery disease)    a. s/p CABG x4 in 2000 with LIMA-LAD, reverse SVG-IM, reverse SVG-acute margin and reverse SVG-RCA b. s/p BMS to SVG-RI in 2012 c. 12/2016: PCI/DES to SVG-RCA and PCI/DES to SVG-RI (Dr. [name])    Carotid artery stenosis    mild    Cataract    Clotting disorder ([place])    Diabetes mellitus without complication ([place]) 37/62/8315   recent A1C 6.1 12/25/16 controlled with diet and exercise    Diverticulitis 2008/2009   GERD (gastroesophageal reflux disease)    controlled as of 04/01/17    HSV infection    HTN (hypertension)    Hx of dysplastic nevus 05/04/2018   upper back spinal    Hyperglycemia    Hyperlipidemia    Impingement syndrome of left shoulder 2016   Low testosterone    Dr [name], [place] ,[place]   Myocardial infarction [facility])    Osteoarthritis of right knee    With trace effusion   Peyronie disease    RLS (restless legs syndrome) 08/15/2013   Sinus bradycardia    Thrombocytopenia ([place])    Vitamin D deficiency    Past Surgical History:  Procedure Laterality Date   CHOLECYSTECTOMY N/A 04/21/2013   Procedure: LAPAROSCOPIC CHOLECYSTECTOMY WITH INTRAOPERATIVE CHOLANGIOGRAM;  Surgeon: Haywood Lasso, MD;  Location: [place];  Service: General;  Laterality: N/A;   CHOLECYSTECTOMY     COLONOSCOPY W/ POLYPECTOMY  2004 & 2009    X 2; Dr Fuller Plan; due 2014   CORONARY ANGIOPLASTY     CORONARY ARTERY BYPASS GRAFT  04/1999   4 vessels   CORONARY STENT INTERVENTION N/A 12/28/2016   Procedure: CORONARY STENT INTERVENTION;  Surgeon: Lorretta Harp, MD;  Location: [place] [facility];  Service: Cardiovascular;  Laterality: N/A;   CORONARY STENT PLACEMENT  2012   Plavix   LAPAROSCOPIC CHOLECYSTECTOMY  04/21/2013   Dr Margot Chimes; acutecholecystitis with necrosis   LEFT  HEART CATH AND CORS/GRAFTS ANGIOGRAPHY N/A 12/28/2016   Procedure: LEFT HEART CATH AND CORS/GRAFTS ANGIOGRAPHY;  Surgeon: Lorretta Harp, MD;  Location: [place] [facility];  Service: Cardiovascular;  Laterality: N/A;   VASECTOMY     Family History  Problem Relation Age of Onset   Aortic aneurysm Father 27       ? abdominal   Heart attack Father 43   Diabetes Other        PGaunt   Coronary artery disease Mother        CABG in 24s   CVA Mother        cns bleed from  warfarin   Aneurysm Mother    Diabetes Brother    Cholecystitis Sister    Cancer Neg Hx    Colon cancer Neg Hx    Prostate cancer Neg Hx    Esophageal cancer Neg Hx    Stomach cancer Neg Hx    Rectal cancer Neg Hx    Social History   Socioeconomic History   Marital status: Married    Spouse name: Not on file   Number of children: 1   Years of education: Not on file   Highest education level: Not on file  Occupational History   Occupation:      Comment: plumber  Tobacco Use   Smoking status: Never Smoker   Smokeless tobacco: Never Used  Substance and Sexual Activity   Alcohol use: Yes    Alcohol/week: 10.0 - 12.0 standard drinks    Types: 10 - 12 Cans of beer per week    Comment: socially    Drug use: No   Sexual activity: Not on file  Other Topics Concern   Not on file  Social History Narrative   Remarried 2014   1 son age 34 as of 03/2017    Social Determinants of Health   Financial Resource Strain:    Difficulty of Paying Living Expenses:   Food Insecurity:    Worried About Charity fundraiser in the Last Year:    Arboriculturist in the Last Year:   Transportation Needs:    Film/video editor (Medical):    Lack of Transportation (Non-Medical):   Physical Activity:    Days of Exercise per Week:    Minutes of Exercise per Session:   Stress:    Feeling of Stress :   Social Connections: Unknown   Frequency of Communication with Friends and Family: Not  on file   Frequency of Social Gatherings with Friends and Family: Not on file   Attends Religious Services: Not on file   Active Member of Clubs or Organizations: Not on file   Attends Archivist Meetings: Not on file   Marital Status: Married    Tobacco Counseling Counseling given: Not Answered   Clinical Intake:  Pre-visit preparation completed: Yes        Diabetes: Yes (Followed by pcp)  How often do you need to have someone help you when you read instructions, pamphlets, or other written materials from your doctor or pharmacy?: 1 - Never  Interpreter Needed?: No    Activities of Daily Living In your present state of health, do you have any difficulty performing the following activities: 11/22/2019  Hearing? N  Vision? N  Difficulty concentrating or making decisions? N  Walking or climbing stairs? Y  Comment SOBOE. Followed by Cardiology.  Dressing or bathing? N  Doing errands, shopping? N  Preparing Food and eating ? N  Using the Toilet? N  In the past six months, have you accidently leaked urine? N  Do you have problems with loss of bowel control? N  Managing your Medications? N  Managing your Finances? N  Housekeeping or managing your Housekeeping? N  Some recent data might be hidden    Patient Care Team: McLean-Scocuzza, Nino Glow, MD as PCP - General (Internal Medicine) Minus Breeding, MD as PCP - Cardiology (Cardiology) Minus Breeding, MD as Consulting Physician (Cardiology) Royston Cowper, MD as Consulting Physician (Urology) Ladene Artist, MD as Consulting Physician (Gastroenterology)  Indicate any recent Medical Services you may have received  from other than [facility] providers in the past year (date may be approximate).     Assessment:   This is a routine wellness examination for [place].  I connected with Gene today by telephone and verified that I am speaking with the correct person using two identifiers. Location patient:  home Location provider: work Persons participating in the virtual visit: patient, Marine scientist.    I discussed the limitations, risks, security and privacy concerns of performing an evaluation and management service by telephone and the availability of in person appointments. The patient expressed understanding and verbally consented to this telephonic visit.    Interactive audio and video telecommunications were attempted between this provider and patient, however failed, due to patient having technical difficulties OR patient did not have access to video capability.  We continued and completed visit with audio only.  Some vital signs may be absent or patient reported.   Hearing/Vision screen  Hearing Screening   125Hz  250Hz  500Hz  1000Hz  2000Hz  3000Hz  4000Hz  6000Hz  8000Hz   Right ear:           Left ear:           Comments: Patient is able to hear conversational tones without difficulty.  No issues reported.  Vision Screening Comments: Followed by Dr. Gloriann Loan Wears reader lenses Visual acuity not assessed, virtual visit.  They have seen their ophthalmologist.   Dietary issues and exercise activities discussed: Current Exercise Habits: Home exercise routine, Type of exercise: walking, Intensity: Mild  Goals      Patient Stated     Follow up with Primary Care Provider as needed (pt-stated)      Modify exercise regimen      Depression Screen PHQ 2/9 Scores 11/22/2019 09/22/2019 11/15/2018 12/23/2017 11/11/2017 09/28/2016 03/30/2016  PHQ - 2 Score 0 0 0 0 0 0 0    Fall Risk Fall Risk  11/22/2019 09/22/2019 11/15/2018 12/23/2017 12/16/2017  Falls in the past year? 0 0 0 No No  Comment - - - - Emmi Telephone Survey: data to providers prior to load  Number falls in past yr: 0 0 - - -  Injury with Fall? - 0 - - -  Risk for fall due to : - No Fall Risks - - -  Follow up Falls evaluation completed Falls evaluation completed - - -   Handrails in use when using stairs? Yes  Home free of loose throw rugs in  walkways, pet beds, electrical cords, etc? Yes  Adequate lighting in your home to reduce risk of falls? Yes   ASSISTIVE DEVICES UTILIZED TO PREVENT FALLS:  Life alert? No  Use of a cane, walker or w/c? No  Grab bars in the bathroom? No  Shower chair or bench in shower? No  Elevated toilet seat or a handicapped toilet? No   TIMED UP AND GO:  Was the test performed? No . Virtual visit.  Cognitive Function: MMSE - Mini Mental State Exam 11/11/2017  Orientation to time 5  Orientation to Place 5  Registration 3  Attention/ Calculation 5  Recall 2  Language- name 2 objects 2  Language- repeat 1  Language- follow 3 step command 3  Language- read & follow direction 1  Write a sentence 1  Copy design 1  Total score 29     6CIT Screen 11/15/2018  What Year? 0 points  What month? 0 points  What time? 0 points  Count back from 20 0 points  Months in reverse 0 points  Repeat phrase  0 points  Total Score 0    Immunizations Immunization History  Administered Date(s) Administered   Influenza, High Dose Seasonal PF 01/25/2015, 03/30/2016, 04/01/2017, 03/29/2018   Influenza,inj,Quad PF,6+ Mos 04/25/2014   Influenza,inj,quad, With Preservative 02/15/2018   Influenza-Unspecified 03/19/2019   Pneumococcal Conjugate-13 09/27/2015   Pneumococcal Polysaccharide-23 04/25/2014   Td 08/07/2008   Zoster 03/21/2014    TDAP status: Due, Education has been provided regarding the importance of this vaccine. Advised may receive this vaccine at local pharmacy or Health Dept. Aware to provide a copy of the vaccination record if obtained from local pharmacy or Health Dept. Verbalized acceptance and understanding.   Health Maintenance Health Maintenance  Topic Date Due   COVID-19 Vaccine (1) Never done   OPHTHALMOLOGY EXAM  07/28/2018   TETANUS/TDAP  08/08/2018   INFLUENZA VACCINE  12/17/2019   HEMOGLOBIN A1C  04/24/2020   COLONOSCOPY  05/01/2020   Hepatitis C Screening   Completed   PNA vac Low Risk Adult  Completed   FOOT EXAM  Discontinued   Covid vaccine- declined.  Eye exam- scheduled at the end of this month.   Dental Screening: Recommended annual dental exams for proper oral hygiene  Community Resource Referral / Chronic Care Management: CRR required this visit?  No   CCM required this visit?  No      Plan:   Keep all routine maintenance appointments.   Next scheduled fasting lab 01/23/20 @ 9:15  Follow up 03/26/20 @ 1:30  I have personally reviewed and noted the following in the patients chart:    Medical and social history  Use of alcohol, tobacco or illicit drugs   Current medications and supplements  Functional ability and status  Nutritional status  Physical activity  Advanced directives  List of other physicians  Hospitalizations, surgeries, and ER visits in previous 12 months  Vitals  Screenings to include cognitive, depression, and falls  Referrals and appointments  In addition, I have reviewed and discussed with patient certain preventive protocols, quality metrics, and best practice recommendations. A written personalized care plan for preventive services as well as general preventive health recommendations were provided to patient.     Varney Biles, LPN   [id number]/0/6237   Nurse Notes: Patient requests covid antibody test be added to lab appointment 01/23/20. States his wife was positive for antibodies and he would like to confirm. Deferred to pcp for follow up.

## 2019-11-27 ENCOUNTER — Encounter: Payer: Self-pay | Admitting: Physician Assistant

## 2019-11-27 ENCOUNTER — Telehealth: Payer: Self-pay

## 2019-11-27 ENCOUNTER — Telehealth (INDEPENDENT_AMBULATORY_CARE_PROVIDER_SITE_OTHER): Payer: Medicare Other | Admitting: Physician Assistant

## 2019-11-27 VITALS — BP 127/71 | HR 60 | Ht 69.0 in | Wt 203.0 lb

## 2019-11-27 DIAGNOSIS — E785 Hyperlipidemia, unspecified: Secondary | ICD-10-CM

## 2019-11-27 DIAGNOSIS — I25700 Atherosclerosis of coronary artery bypass graft(s), unspecified, with unstable angina pectoris: Secondary | ICD-10-CM | POA: Diagnosis not present

## 2019-11-27 DIAGNOSIS — I10 Essential (primary) hypertension: Secondary | ICD-10-CM

## 2019-11-27 DIAGNOSIS — Z01810 Encounter for preprocedural cardiovascular examination: Secondary | ICD-10-CM | POA: Diagnosis not present

## 2019-11-27 NOTE — Progress Notes (Signed)
Virtual Visit via Video Note   This visit type was conducted due to national recommendations for restrictions regarding the COVID-19 Pandemic (e.g. social distancing) in an effort to limit this patient's exposure and mitigate transmission in our community.  Due to his co-morbid illnesses, this patient is at least at moderate risk for complications without adequate follow up.  This format is felt to be most appropriate for this patient at this time.  All issues noted in this document were discussed and addressed.  A limited physical exam was performed with this format.  Please refer to the patient's chart for his consent to telehealth for [facility].   The patient was identified using 2 identifiers.  Date:  11/28/2019   ID:  Kevin Webb, DOB 07/14/1945, MRN [id number]  Patient Location: Home Provider Location: Office/Clinic  PCP:  McLean-Scocuzza, Nino Glow, MD  Cardiologist:  Minus Breeding, MD  Electrophysiologist:  None   Evaluation Performed:  Follow-Up Visit  Chief Complaint:  Follow up  History of Present Illness:    Kevin Webb is a 74 y.o. male with hx of CAD s/p CABG x 4 (LIMA-LAD, reverse SVG-OM, reverse SVG-acute marginal and reverse SVG-RCA in 2000), HTN, HLD, diet-controlled DM 2 and history of carotid artery disease.  Patient had BMS to SVG to RI in 2012. He had a NSTEMI in August 2018 and underwent a repeat cardiac catheterization which revealed severe native disease, patent LIMA to LAD, 75% stenosis in SVG to RCA, 90% stenosis in SVG to RI.  He underwent a successful DES placement to both lesion.  After the procedure, he was placed on aspirin and Brilinta.  Patient was recently seen by Dr. [name] at which time he was doing well.  I last saw the patient on 11/02/2019 for evaluation of low blood pressure and recurrent chest tightness and jaw pain.  There were several incidents where his blood pressure dropped down to the 90s.  This typically occurs in the morning.  I  recommended to move his Toprol-XL to the afternoon and leave ramipril in the morning to space out the 2 blood pressure medications.  If blood pressure continue to be low, then I would recommend a decrease ramipril to 5 mg every morning.  According to the patient, exertional jaw pain was his previous anginal symptom however his recent symptoms does not have clear correlation with the degree of exertion.  He underwent a Myoview which came back normal without significant ischemia.  Talking with the with the patient today, he has worsening dyspnea on exertion and worsening chest discomfort and jaw pain.  It is now occurring on a daily basis and occurs with minimal activity.  Despite the recent normal Myoview, his symptom is very much concerning for progressive angina.  Recent lab work in June does not show any obvious significant anemia to explain his symptoms.  I discussed the case with Dr. [name] who recommended proceeding with cardiac catheterization for definitive evaluation.  He is no longer taking the Imdur due to significant drop in the blood pressure.  Since the last office visit, his blood pressure has essentially normalized.  He is on amlodipine and Toprol-XL for antianginal purposes.  I recommended him to hold ramipril in the morning of cardiac catheterization.  Risk and benefit of the cardiac catheterization has been explained to the patient who is agreeable to proceed.   The patient does not have symptoms concerning for COVID-19 infection (fever, chills, cough, or new shortness of breath).  Past Medical History:  Diagnosis Date  . Acute cholecystitis 04/18/2013  . Adenomatous colon polyp 12/2004  . Anemia   . Blood transfusion without reported diagnosis   . BPH associated with nocturia   . CAD (coronary artery disease)    a. s/p CABG x4 in 2000 with LIMA-LAD, reverse SVG-IM, reverse SVG-acute margin and reverse SVG-RCA b. s/p BMS to SVG-RI in 2012 c. 12/2016: PCI/DES to SVG-RCA and PCI/DES  to SVG-RI (Dr. [name])   . Carotid artery stenosis    mild   . Cataract   . Clotting disorder ([place])   . Diabetes mellitus without complication ([place]) 79/06/4095   recent A1C 6.1 12/25/16 controlled with diet and exercise   . Diverticulitis 2008/2009  . GERD (gastroesophageal reflux disease)    controlled as of 04/01/17   . HSV infection   . HTN (hypertension)   . Hx of dysplastic nevus 05/04/2018   upper back spinal   . Hyperglycemia   . Hyperlipidemia   . Impingement syndrome of left shoulder 2016  . Low testosterone    Dr [name], [place] ,[place]  . Myocardial infarction ([place])   . Osteoarthritis of right knee    With trace effusion  . Peyronie disease   . RLS (restless legs syndrome) 08/15/2013  . Sinus bradycardia   . Thrombocytopenia ([place])   . Vitamin D deficiency    Past Surgical History:  Procedure Laterality Date  . CHOLECYSTECTOMY N/A 04/21/2013   Procedure: LAPAROSCOPIC CHOLECYSTECTOMY WITH INTRAOPERATIVE CHOLANGIOGRAM;  Surgeon: Haywood Lasso, MD;  Location: [facility];  Service: General;  Laterality: N/A;  . CHOLECYSTECTOMY    . COLONOSCOPY W/ POLYPECTOMY  2004 & 2009    X 2; Dr Fuller Plan; due 2014  . CORONARY ANGIOPLASTY    . CORONARY ARTERY BYPASS GRAFT  04/1999   4 vessels  . CORONARY STENT INTERVENTION N/A 12/28/2016   Procedure: CORONARY STENT INTERVENTION;  Surgeon: Lorretta Harp, MD;  Location: [facility];  Service: Cardiovascular;  Laterality: N/A;  . CORONARY STENT PLACEMENT  2012   Plavix  . LAPAROSCOPIC CHOLECYSTECTOMY  04/21/2013   Dr Margot Chimes; acutecholecystitis with necrosis  . LEFT HEART CATH AND CORS/GRAFTS ANGIOGRAPHY N/A 12/28/2016   Procedure: LEFT HEART CATH AND CORS/GRAFTS ANGIOGRAPHY;  Surgeon: Lorretta Harp, MD;  Location: [facility];  Service: Cardiovascular;  Laterality: N/A;  . VASECTOMY       Current Meds  Medication Sig  . amLODipine (NORVASC) 5 MG tablet TAKE ONE (1) TABLET EACH DAY (Patient taking differently: Take  5 mg by mouth daily at 2 am. )  . Ascorbic Acid (VITAMIN C) 1000 MG tablet Take 1,000 mg by mouth daily at 2 am. Reported on 09/26/2015  . ciprofloxacin (CIPRO) 500 MG tablet Take 1 tablet (500 mg total) by mouth 2 (two) times daily. (Patient not taking: Reported on 11/27/2019)  . clopidogrel (PLAVIX) 75 MG tablet Take 1 tablet (75 mg total) by mouth daily. (Patient taking differently: Take 75 mg by mouth daily at 2 am. )  . ezetimibe (ZETIA) 10 MG tablet Take 1 tablet (10 mg total) by mouth daily. (Patient taking differently: Take 10 mg by mouth daily at 2 am. )  . GINKGO BILOBA PO Take 1 capsule by mouth daily at 2 am.   . metoprolol succinate (TOPROL XL) 25 MG 24 hr tablet Take 1 tablet (25 mg total) by mouth daily. (Patient taking differently: Take 25 mg by mouth daily at 2 am. )  .  metroNIDAZOLE (FLAGYL) 500 MG tablet Take 1 tablet (500 mg total) by mouth 3 (three) times daily. With food (Patient not taking: Reported on 11/27/2019)  . nitroGLYCERIN (NITROSTAT) 0.4 MG SL tablet Place 1 tablet (0.4 mg total) under the tongue every 5 (five) minutes x 3 doses as needed. (Patient taking differently: Place 0.4 mg under the tongue every 5 (five) minutes x 3 doses as needed for chest pain. )  . Omega-3 Fatty Acids (FISH OIL) 1000 MG CAPS Take 1,000 mg by mouth daily at 2 am. 1 tab po qd   . pantoprazole (PROTONIX) 40 MG tablet Take 1 tablet (40 mg total) by mouth 2 (two) times daily before a meal. 30 minutes. Try to reduce to 1x per day now or in 3 months (Patient taking differently: Take 40 mg by mouth daily at 2 am. 30 minutes. Try to reduce to 1x per day now or in 3 months)  . ramipril (ALTACE) 10 MG capsule Take 1 capsule (10 mg total) by mouth daily. (Patient taking differently: Take 10 mg by mouth daily at 2 am. )  . rosuvastatin (CRESTOR) 40 MG tablet Take 1 tablet (40 mg total) by mouth at bedtime. (Patient taking differently: Take 40 mg by mouth daily at 2 am. )  . tamsulosin (FLOMAX) 0.4 MG CAPS  capsule Take 0.4 mg by mouth daily at 2 am.   . vitamin E 100 UNIT capsule Take 100 Units by mouth daily at 2 am.   . [DISCONTINUED] aspirin 81 MG tablet Take 81 mg by mouth daily.  . [DISCONTINUED] Cholecalciferol (VITAMIN D3) 5000 UNITS CAPS Take by mouth daily. Alternates 10K IU and 15 K IU D3 was on 5K IU qd and vitamin D still low will repeat as of 06/20/19  . [DISCONTINUED] co-enzyme Q-10 30 MG capsule Take 30 mg by mouth daily.      Allergies:   Darvocet [propoxyphene n-acetaminophen] and Propoxyphene   Social History   Tobacco Use  . Smoking status: Never Smoker  . Smokeless tobacco: Never Used  Substance Use Topics  . Alcohol use: Yes    Alcohol/week: 10.0 - 12.0 standard drinks    Types: 10 - 12 Cans of beer per week    Comment: socially   . Drug use: No     Family Hx: The patient's family history includes Aneurysm in his mother; Aortic aneurysm (age of onset: 38) in his father; CVA in his mother; Cholecystitis in his sister; Coronary artery disease in his mother; Diabetes in his brother and another family member; Heart attack (age of onset: 61) in his father. There is no history of Cancer, Colon cancer, Prostate cancer, Esophageal cancer, Stomach cancer, or Rectal cancer.  ROS:   Please see the history of present illness.     All other systems reviewed and are negative.   Prior CV studies:   The following studies were reviewed today:  Myoview 11/14/2019  The left ventricular ejection fraction is mildly decreased (45-54%).  Nuclear stress EF: 50%.  There was no ST segment deviation noted during stress.  No T wave inversion was noted during stress.  Defect 1: There is a small defect present in the apical inferior and apical lateral locations.  Findings consistent with prior myocardial infarction. No ischemia.  This is a low risk study.     Labs/Other Tests and Data Reviewed:    EKG:  An ECG dated 11/03/2019 was personally reviewed today and demonstrated:   Sinus rhythm, right bundle branch block,  and occasional PACs.  Recent Labs: 07/18/2019: TSH 1.40 10/24/2019: ALT 24; BUN 8; Creatinine, Ser 0.83 11/28/2019: Hemoglobin 14.5; Platelets 163; Potassium 4.7; Sodium 138   Recent Lipid Panel Lab Results  Component Value Date/Time   CHOL 150 10/24/2019 09:12 AM   TRIG 224.0 (H) 10/24/2019 09:12 AM   HDL 43.70 10/24/2019 09:12 AM   CHOLHDL 3 10/24/2019 09:12 AM   LDLCALC 56 07/18/2019 09:56 AM   LDLDIRECT 74.0 10/24/2019 09:12 AM    Wt Readings from Last 3 Encounters:  11/27/19 203 lb (92.1 kg)  11/22/19 212 lb (96.2 kg)  11/14/19 212 lb (96.2 kg)     Objective:    Vital Signs:  BP 127/71   Pulse 60   Ht 5\' 9"  (1.753 m)   Wt 203 lb (92.1 kg)   BMI 29.98 kg/m    VITAL SIGNS:  reviewed  ASSESSMENT & PLAN:    1. CAD s/p CABG: Despite the recent low risk Myoview, he continued to have worsening exertional dyspnea and the chest tightness.  Now the symptom is occurring on a daily basis with minimal activity.  I discussed the case with Dr. [name] who agrees that the patient will need evaluation with cardiac catheterization. Continue aspirin and Plavix  - Risk and benefit of procedure explained to the patient who display clear understanding and agree to proceed.  Discussed with patient possible procedural risk include bleeding, vascular injury, renal injury, arrythmia, MI, stroke and loss of limb or life.  2. Hypertension: Blood pressure stable  3. Hyperlipidemia: On Crestor   COVID-19 Education: The signs and symptoms of COVID-19 were discussed with the patient and how to seek care for testing (follow up with PCP or arrange E-visit).  The importance of social distancing was discussed today.  Time:   Today, I have spent 15 minutes with the patient with telehealth technology discussing the above problems.     Medication Adjustments/Labs and Tests Ordered: Current medicines are reviewed at length with the patient today.  Concerns  regarding medicines are outlined above.   Tests Ordered: Orders Placed This Encounter  Procedures  . CBC  . Basic metabolic panel    Medication Changes: No orders of the defined types were placed in this encounter.   Follow Up:  Either In Person or Virtual in 3 week(s)  Signed, Almyra Deforest, PA  11/28/2019 10:58 PM    [facility]

## 2019-11-27 NOTE — Patient Instructions (Addendum)
Medication Instructions:   HOLD Rampril the morning of cardiac catheterization  *If you need a refill on your cardiac medications before your next appointment, please call your pharmacy*  Lab Work: Your physician recommends that you return for lab work Wednesday 11/29/19:   BMET  CBC If you have labs (blood work) drawn today and your tests are completely normal, you will receive your results only by: Marland Kitchen MyChart Message (if you have MyChart) OR . A paper copy in the mail If you have any lab test that is abnormal or we need to change your treatment, we will call you to review the results.  Testing/Procedures: Your physician has requested that you have a cardiac catheterization. Cardiac catheterization is used to diagnose and/or treat various heart conditions. Doctors may recommend this procedure for a number of different reasons. The most common reason is to evaluate chest pain. Chest pain can be a symptom of coronary artery disease (CAD), and cardiac catheterization can show whether plaque is narrowing or blocking your heart's arteries. This procedure is also used to evaluate the valves, as well as measure the blood flow and oxygen levels in different parts of your heart. For further information please visit [email or web address]. Please follow instruction sheet, as given.  Follow-Up: At [facility], you and your health needs are our priority.  As part of our continuing mission to provide you with exceptional heart care, we have created designated Provider Care Teams.  These Care Teams include your primary Cardiologist (physician) and Advanced Practice Providers (APPs -  Physician Assistants and Nurse Practitioners) who all work together to provide you with the care you need, when you need it.  Your next appointment:   2 week(s) after cardiac catheterization  The format for your next appointment:   In Person  Provider:   Minus Breeding, MD  Other Instructions        [address] [place] [address] Dept: [phone] Loc: [facility]  11/27/2019  You are scheduled for a Cardiac Catheterization on Friday, July 16 with Dr. Peter Martinique.  1. Please arrive at the [facility] (Main Entrance A) at [facility]: [address] [place], [place] [address] at 5:30 AM (This time is two hours before your procedure to ensure your preparation). Free valet parking service is available.   Special note: Every effort is made to have your procedure done on time. Please understand that emergencies sometimes delay scheduled procedures.  2. Diet: Do not eat solid foods after midnight.  The patient may have clear liquids until 5am upon the day of the procedure.  3. Labs: You will need to have blood drawn on Wednesday, July 14 at [facility]  Open: 8am - 5pm (Lunch 12:30 - 1:30)   Phone: [phone]. You do not need to be fasting.  4. Medication instructions in preparation for your procedure:   Contrast Allergy: No   Hold Rampril morning of cardiac catheterization   On the morning of your procedure, take your Aspirin and any morning medicines NOT listed above.  You may use sips of water.  5. Plan for one night stay--bring personal belongings. 6. Bring a current list of your medications and current insurance cards. 7. You MUST have a responsible person to drive you home. 8. Someone MUST be with you the first 24 hours after you arrive home or your discharge will be delayed.  9. Please wear clothes that are easy to get on and off and wear slip-on shoes.  Thank you for allowing [place] to care for you!   -- [facility]

## 2019-11-27 NOTE — H&P (View-Only) (Signed)
Virtual Visit via Video Note   This visit type was conducted due to national recommendations for restrictions regarding the COVID-19 Pandemic (e.g. social distancing) in an effort to limit this patient's exposure and mitigate transmission in our community.  Due to his co-morbid illnesses, this patient is at least at moderate risk for complications without adequate follow up.  This format is felt to be most appropriate for this patient at this time.  All issues noted in this document were discussed and addressed.  A limited physical exam was performed with this format.  Please refer to the patient's chart for his consent to telehealth for [facility].   The patient was identified using 2 identifiers.  Date:  11/28/2019   ID:  Kevin Webb, DOB 09-Jul-1945, MRN [id number]  Patient Location: Home Provider Location: Office/Clinic  PCP:  McLean-Scocuzza, Nino Glow, MD  Cardiologist:  Minus Breeding, MD  Electrophysiologist:  None   Evaluation Performed:  Follow-Up Visit  Chief Complaint:  Follow up  History of Present Illness:    Kevin Webb is a 74 y.o. male with hx of CAD s/p CABG x 4 (LIMA-LAD, reverse SVG-OM, reverse SVG-acute marginal and reverse SVG-RCA in 2000), HTN, HLD, diet-controlled DM 2 and history of carotid artery disease.  Patient had BMS to SVG to RI in 2012. He had a NSTEMI in August 2018 and underwent a repeat cardiac catheterization which revealed severe native disease, patent LIMA to LAD, 75% stenosis in SVG to RCA, 90% stenosis in SVG to RI.  He underwent a successful DES placement to both lesion.  After the procedure, he was placed on aspirin and Brilinta.  Patient was recently seen by Dr. [name] at which time he was doing well.  I last saw the patient on 11/02/2019 for evaluation of low blood pressure and recurrent chest tightness and jaw pain.  There were several incidents where his blood pressure dropped down to the 90s.  This typically occurs in the morning.  I  recommended to move his Toprol-XL to the afternoon and leave ramipril in the morning to space out the 2 blood pressure medications.  If blood pressure continue to be low, then I would recommend a decrease ramipril to 5 mg every morning.  According to the patient, exertional jaw pain was his previous anginal symptom however his recent symptoms does not have clear correlation with the degree of exertion.  He underwent a Myoview which came back normal without significant ischemia.  Talking with the with the patient today, he has worsening dyspnea on exertion and worsening chest discomfort and jaw pain.  It is now occurring on a daily basis and occurs with minimal activity.  Despite the recent normal Myoview, his symptom is very much concerning for progressive angina.  Recent lab work in June does not show any obvious significant anemia to explain his symptoms.  I discussed the case with Dr. [name] who recommended proceeding with cardiac catheterization for definitive evaluation.  He is no longer taking the Imdur due to significant drop in the blood pressure.  Since the last office visit, his blood pressure has essentially normalized.  He is on amlodipine and Toprol-XL for antianginal purposes.  I recommended him to hold ramipril in the morning of cardiac catheterization.  Risk and benefit of the cardiac catheterization has been explained to the patient who is agreeable to proceed.   The patient does not have symptoms concerning for COVID-19 infection (fever, chills, cough, or new shortness of breath).  Past Medical History:  Diagnosis Date  . Acute cholecystitis 04/18/2013  . Adenomatous colon polyp 12/2004  . Anemia   . Blood transfusion without reported diagnosis   . BPH associated with nocturia   . CAD (coronary artery disease)    a. s/p CABG x4 in 2000 with LIMA-LAD, reverse SVG-IM, reverse SVG-acute margin and reverse SVG-RCA b. s/p BMS to SVG-RI in 2012 c. 12/2016: PCI/DES to SVG-RCA and PCI/DES  to SVG-RI (Dr. [name])   . Carotid artery stenosis    mild   . Cataract   . Clotting disorder ([place])   . Diabetes mellitus without complication ([place]) 08/67/6195   recent A1C 6.1 12/25/16 controlled with diet and exercise   . Diverticulitis 2008/2009  . GERD (gastroesophageal reflux disease)    controlled as of 04/01/17   . HSV infection   . HTN (hypertension)   . Hx of dysplastic nevus 05/04/2018   upper back spinal   . Hyperglycemia   . Hyperlipidemia   . Impingement syndrome of left shoulder 2016  . Low testosterone    Dr [name], [place] ,[place]  . Myocardial infarction ([place])   . Osteoarthritis of right knee    With trace effusion  . Peyronie disease   . RLS (restless legs syndrome) 08/15/2013  . Sinus bradycardia   . Thrombocytopenia ([place])   . Vitamin D deficiency    Past Surgical History:  Procedure Laterality Date  . CHOLECYSTECTOMY N/A 04/21/2013   Procedure: LAPAROSCOPIC CHOLECYSTECTOMY WITH INTRAOPERATIVE CHOLANGIOGRAM;  Surgeon: Haywood Lasso, MD;  Location: [facility];  Service: General;  Laterality: N/A;  . CHOLECYSTECTOMY    . COLONOSCOPY W/ POLYPECTOMY  2004 & 2009    X 2; Dr Fuller Plan; due 2014  . CORONARY ANGIOPLASTY    . CORONARY ARTERY BYPASS GRAFT  04/1999   4 vessels  . CORONARY STENT INTERVENTION N/A 12/28/2016   Procedure: CORONARY STENT INTERVENTION;  Surgeon: Lorretta Harp, MD;  Location: [facility];  Service: Cardiovascular;  Laterality: N/A;  . CORONARY STENT PLACEMENT  2012   Plavix  . LAPAROSCOPIC CHOLECYSTECTOMY  04/21/2013   Dr Margot Chimes; acutecholecystitis with necrosis  . LEFT HEART CATH AND CORS/GRAFTS ANGIOGRAPHY N/A 12/28/2016   Procedure: LEFT HEART CATH AND CORS/GRAFTS ANGIOGRAPHY;  Surgeon: Lorretta Harp, MD;  Location: [facility];  Service: Cardiovascular;  Laterality: N/A;  . VASECTOMY       Current Meds  Medication Sig  . amLODipine (NORVASC) 5 MG tablet TAKE ONE (1) TABLET EACH DAY (Patient taking differently: Take  5 mg by mouth daily at 2 am. )  . Ascorbic Acid (VITAMIN C) 1000 MG tablet Take 1,000 mg by mouth daily at 2 am. Reported on 09/26/2015  . ciprofloxacin (CIPRO) 500 MG tablet Take 1 tablet (500 mg total) by mouth 2 (two) times daily. (Patient not taking: Reported on 11/27/2019)  . clopidogrel (PLAVIX) 75 MG tablet Take 1 tablet (75 mg total) by mouth daily. (Patient taking differently: Take 75 mg by mouth daily at 2 am. )  . ezetimibe (ZETIA) 10 MG tablet Take 1 tablet (10 mg total) by mouth daily. (Patient taking differently: Take 10 mg by mouth daily at 2 am. )  . GINKGO BILOBA PO Take 1 capsule by mouth daily at 2 am.   . metoprolol succinate (TOPROL XL) 25 MG 24 hr tablet Take 1 tablet (25 mg total) by mouth daily. (Patient taking differently: Take 25 mg by mouth daily at 2 am. )  .  metroNIDAZOLE (FLAGYL) 500 MG tablet Take 1 tablet (500 mg total) by mouth 3 (three) times daily. With food (Patient not taking: Reported on 11/27/2019)  . nitroGLYCERIN (NITROSTAT) 0.4 MG SL tablet Place 1 tablet (0.4 mg total) under the tongue every 5 (five) minutes x 3 doses as needed. (Patient taking differently: Place 0.4 mg under the tongue every 5 (five) minutes x 3 doses as needed for chest pain. )  . Omega-3 Fatty Acids (FISH OIL) 1000 MG CAPS Take 1,000 mg by mouth daily at 2 am. 1 tab po qd   . pantoprazole (PROTONIX) 40 MG tablet Take 1 tablet (40 mg total) by mouth 2 (two) times daily before a meal. 30 minutes. Try to reduce to 1x per day now or in 3 months (Patient taking differently: Take 40 mg by mouth daily at 2 am. 30 minutes. Try to reduce to 1x per day now or in 3 months)  . ramipril (ALTACE) 10 MG capsule Take 1 capsule (10 mg total) by mouth daily. (Patient taking differently: Take 10 mg by mouth daily at 2 am. )  . rosuvastatin (CRESTOR) 40 MG tablet Take 1 tablet (40 mg total) by mouth at bedtime. (Patient taking differently: Take 40 mg by mouth daily at 2 am. )  . tamsulosin (FLOMAX) 0.4 MG CAPS  capsule Take 0.4 mg by mouth daily at 2 am.   . vitamin E 100 UNIT capsule Take 100 Units by mouth daily at 2 am.   . [DISCONTINUED] aspirin 81 MG tablet Take 81 mg by mouth daily.  . [DISCONTINUED] Cholecalciferol (VITAMIN D3) 5000 UNITS CAPS Take by mouth daily. Alternates 10K IU and 15 K IU D3 was on 5K IU qd and vitamin D still low will repeat as of 06/20/19  . [DISCONTINUED] co-enzyme Q-10 30 MG capsule Take 30 mg by mouth daily.      Allergies:   Darvocet [propoxyphene n-acetaminophen] and Propoxyphene   Social History   Tobacco Use  . Smoking status: Never Smoker  . Smokeless tobacco: Never Used  Substance Use Topics  . Alcohol use: Yes    Alcohol/week: 10.0 - 12.0 standard drinks    Types: 10 - 12 Cans of beer per week    Comment: socially   . Drug use: No     Family Hx: The patient's family history includes Aneurysm in his mother; Aortic aneurysm (age of onset: 10) in his father; CVA in his mother; Cholecystitis in his sister; Coronary artery disease in his mother; Diabetes in his brother and another family member; Heart attack (age of onset: 39) in his father. There is no history of Cancer, Colon cancer, Prostate cancer, Esophageal cancer, Stomach cancer, or Rectal cancer.  ROS:   Please see the history of present illness.     All other systems reviewed and are negative.   Prior CV studies:   The following studies were reviewed today:  Myoview 11/14/2019  The left ventricular ejection fraction is mildly decreased (45-54%).  Nuclear stress EF: 50%.  There was no ST segment deviation noted during stress.  No T wave inversion was noted during stress.  Defect 1: There is a small defect present in the apical inferior and apical lateral locations.  Findings consistent with prior myocardial infarction. No ischemia.  This is a low risk study.     Labs/Other Tests and Data Reviewed:    EKG:  An ECG dated 11/03/2019 was personally reviewed today and demonstrated:   Sinus rhythm, right bundle branch block,  and occasional PACs.  Recent Labs: 07/18/2019: TSH 1.40 10/24/2019: ALT 24; BUN 8; Creatinine, Ser 0.83 11/28/2019: Hemoglobin 14.5; Platelets 163; Potassium 4.7; Sodium 138   Recent Lipid Panel Lab Results  Component Value Date/Time   CHOL 150 10/24/2019 09:12 AM   TRIG 224.0 (H) 10/24/2019 09:12 AM   HDL 43.70 10/24/2019 09:12 AM   CHOLHDL 3 10/24/2019 09:12 AM   LDLCALC 56 07/18/2019 09:56 AM   LDLDIRECT 74.0 10/24/2019 09:12 AM    Wt Readings from Last 3 Encounters:  11/27/19 203 lb (92.1 kg)  11/22/19 212 lb (96.2 kg)  11/14/19 212 lb (96.2 kg)     Objective:    Vital Signs:  BP 127/71   Pulse 60   Ht 5\' 9"  (1.753 m)   Wt 203 lb (92.1 kg)   BMI 29.98 kg/m    VITAL SIGNS:  reviewed  ASSESSMENT & PLAN:    1. CAD s/p CABG: Despite the recent low risk Myoview, he continued to have worsening exertional dyspnea and the chest tightness.  Now the symptom is occurring on a daily basis with minimal activity.  I discussed the case with Dr. [name] who agrees that the patient will need evaluation with cardiac catheterization. Continue aspirin and Plavix  - Risk and benefit of procedure explained to the patient who display clear understanding and agree to proceed.  Discussed with patient possible procedural risk include bleeding, vascular injury, renal injury, arrythmia, MI, stroke and loss of limb or life.  2. Hypertension: Blood pressure stable  3. Hyperlipidemia: On Crestor   COVID-19 Education: The signs and symptoms of COVID-19 were discussed with the patient and how to seek care for testing (follow up with PCP or arrange E-visit).  The importance of social distancing was discussed today.  Time:   Today, I have spent 15 minutes with the patient with telehealth technology discussing the above problems.     Medication Adjustments/Labs and Tests Ordered: Current medicines are reviewed at length with the patient today.  Concerns  regarding medicines are outlined above.   Tests Ordered: Orders Placed This Encounter  Procedures  . CBC  . Basic metabolic panel    Medication Changes: No orders of the defined types were placed in this encounter.   Follow Up:  Either In Person or Virtual in 3 week(s)  Signed, Almyra Deforest, PA  11/28/2019 10:58 PM    [facility]

## 2019-11-27 NOTE — Telephone Encounter (Signed)
  Patient Consent for Virtual Visit         Kevin Webb has provided verbal consent on 11/27/2019 for a virtual visit (video or telephone).   CONSENT FOR VIRTUAL VISIT FOR:  Kevin Webb  By participating in this virtual visit I agree to the following:  I hereby voluntarily request, consent and authorize [facility] and its employed or contracted physicians, physician assistants, nurse practitioners or other licensed health care professionals (the Practitioner), to provide me with telemedicine health care services (the "Services") as deemed necessary by the treating Practitioner. I acknowledge and consent to receive the Services by the Practitioner via telemedicine. I understand that the telemedicine visit will involve communicating with the Practitioner through live audiovisual communication technology and the disclosure of certain medical information by electronic transmission. I acknowledge that I have been given the opportunity to request an in-person assessment or other available alternative prior to the telemedicine visit and am voluntarily participating in the telemedicine visit.  I understand that I have the right to withhold or withdraw my consent to the use of telemedicine in the course of my care at any time, without affecting my right to future care or treatment, and that the Practitioner or I may terminate the telemedicine visit at any time. I understand that I have the right to inspect all information obtained and/or recorded in the course of the telemedicine visit and may receive copies of available information for a reasonable fee.  I understand that some of the potential risks of receiving the Services via telemedicine include:  Marland Kitchen Delay or interruption in medical evaluation due to technological equipment failure or disruption; . Information transmitted may not be sufficient (e.g. poor resolution of images) to allow for appropriate medical decision making by the Practitioner;  and/or  . In rare instances, security protocols could fail, causing a breach of personal health information.  Furthermore, I acknowledge that it is my responsibility to provide information about my medical history, conditions and care that is complete and accurate to the best of my ability. I acknowledge that Practitioner's advice, recommendations, and/or decision may be based on factors not within their control, such as incomplete or inaccurate data provided by me or distortions of diagnostic images or specimens that may result from electronic transmissions. I understand that the practice of medicine is not an exact science and that Practitioner makes no warranties or guarantees regarding treatment outcomes. I acknowledge that a copy of this consent can be made available to me via my patient portal ([facility]), or I can request a printed copy by calling the office of [facility].    I understand that my insurance will be billed for this visit.   I have read or had this consent read to me. . I understand the contents of this consent, which adequately explains the benefits and risks of the Services being provided via telemedicine.  . I have been provided ample opportunity to ask questions regarding this consent and the Services and have had my questions answered to my satisfaction. . I give my informed consent for the services to be provided through the use of telemedicine in my medical care

## 2019-11-29 ENCOUNTER — Other Ambulatory Visit: Payer: Self-pay | Admitting: Physician Assistant

## 2019-11-29 LAB — BASIC METABOLIC PANEL
BUN/Creatinine Ratio: 12 (ref 10–24)
BUN: 10 mg/dL (ref 8–27)
CO2: 22 mmol/L (ref 20–29)
Calcium: 9.6 mg/dL (ref 8.6–10.2)
Chloride: 102 mmol/L (ref 96–106)
Creatinine, Ser: 0.82 mg/dL (ref 0.76–1.27)
GFR calc Af Amer: 103 mL/min/{1.73_m2} (ref >59)
GFR calc non Af Amer: 89 mL/min/{1.73_m2} (ref >59)
Glucose: 115 mg/dL — ABNORMAL HIGH (ref 65–99)
Potassium: 4.7 mmol/L (ref 3.5–5.2)
Sodium: 138 mmol/L (ref 134–144)

## 2019-11-29 LAB — CBC
Hematocrit: 42.3 % (ref 37.5–51.0)
Hemoglobin: 14.5 g/dL (ref 13.0–17.7)
MCH: 31.3 pg (ref 26.6–33.0)
MCHC: 34.3 g/dL (ref 31.5–35.7)
MCV: 91 fL (ref 79–97)
Platelets: 163 10*3/uL (ref 150–450)
RBC: 4.63 x10E6/uL (ref 4.14–5.80)
RDW: 13.7 % (ref 11.6–15.4)
WBC: 5.4 10*3/uL (ref 3.4–10.8)

## 2019-11-29 MED ORDER — SODIUM CHLORIDE 0.9% FLUSH
3.0000 mL | Freq: Two times a day (BID) | INTRAVENOUS | Status: DC
Start: 2019-11-29 — End: 2023-08-18

## 2019-11-30 ENCOUNTER — Telehealth: Payer: Self-pay | Admitting: *Deleted

## 2019-11-30 NOTE — Telephone Encounter (Addendum)
Pt contacted pre-catheterization scheduled at [facility] for: Friday December 01, 2019 7:30 AM Verified arrival time and place: [facility]) at: 5:30 AM  No solid food after midnight prior to cath, clear liquids until 5 AM day of procedure.  AM meds can be  taken pre-cath with sips of water including: ASA 81 mg Plavix 75 mg  Confirmed patient has responsible adult to drive home post procedure and observe 24 hours after arriving home: yes  You are allowed ONE visitor in the waiting room during your procedure. Both you and your visitor must wear a mask once you enter the hospital.      COVID-19 Pre-Screening Questions:  . In the past 14 days have you had a new cough, shortness of breath, headache, congestion, fever (100 or greater) unexplained body aches, new sore throat, or sudden loss of taste or sense of smell? no . In the past 14 days have you been around anyone with known Covid 19? No   Reviewed procedure/mask/visitor instructions, COVID-19 questions with patient.  Pt states he has not had COVID-19 vaccine.

## 2019-12-01 ENCOUNTER — Encounter (HOSPITAL_COMMUNITY)
Admission: RE | Disposition: A | Discharge status: Home / Self Care | Payer: Self-pay | Source: Home / Self Care | Attending: Cardiology

## 2019-12-01 ENCOUNTER — Encounter (HOSPITAL_COMMUNITY): Payer: Self-pay | Admitting: Cardiology

## 2019-12-01 ENCOUNTER — Ambulatory Visit (HOSPITAL_COMMUNITY)
Admission: RE | Admit: 2019-12-01 | Discharge: 2019-12-01 | Disposition: A | Discharge status: Home / Self Care | Payer: Medicare Other | Attending: Cardiology | Admitting: Cardiology

## 2019-12-01 DIAGNOSIS — I25810 Atherosclerosis of coronary artery bypass graft(s) without angina pectoris: Secondary | ICD-10-CM | POA: Diagnosis present

## 2019-12-01 DIAGNOSIS — I200 Unstable angina: Secondary | ICD-10-CM

## 2019-12-01 DIAGNOSIS — K219 Gastro-esophageal reflux disease without esophagitis: Secondary | ICD-10-CM | POA: Diagnosis not present

## 2019-12-01 DIAGNOSIS — E785 Hyperlipidemia, unspecified: Secondary | ICD-10-CM | POA: Diagnosis present

## 2019-12-01 DIAGNOSIS — G2581 Restless legs syndrome: Secondary | ICD-10-CM | POA: Diagnosis not present

## 2019-12-01 DIAGNOSIS — I25710 Atherosclerosis of autologous vein coronary artery bypass graft(s) with unstable angina pectoris: Secondary | ICD-10-CM

## 2019-12-01 DIAGNOSIS — E119 Type 2 diabetes mellitus without complications: Secondary | ICD-10-CM

## 2019-12-01 DIAGNOSIS — Z951 Presence of aortocoronary bypass graft: Secondary | ICD-10-CM | POA: Diagnosis not present

## 2019-12-01 DIAGNOSIS — I252 Old myocardial infarction: Secondary | ICD-10-CM | POA: Insufficient documentation

## 2019-12-01 DIAGNOSIS — R0609 Other forms of dyspnea: Secondary | ICD-10-CM | POA: Diagnosis not present

## 2019-12-01 DIAGNOSIS — Z7902 Long term (current) use of antithrombotics/antiplatelets: Secondary | ICD-10-CM | POA: Diagnosis not present

## 2019-12-01 DIAGNOSIS — I10 Essential (primary) hypertension: Secondary | ICD-10-CM | POA: Diagnosis present

## 2019-12-01 DIAGNOSIS — Z79899 Other long term (current) drug therapy: Secondary | ICD-10-CM | POA: Diagnosis not present

## 2019-12-01 DIAGNOSIS — I25110 Atherosclerotic heart disease of native coronary artery with unstable angina pectoris: Secondary | ICD-10-CM

## 2019-12-01 HISTORY — DX: Unstable angina: I20.0

## 2019-12-01 HISTORY — PX: LEFT HEART CATH AND CORS/GRAFTS ANGIOGRAPHY: CATH118250

## 2019-12-01 LAB — GLUCOSE, CAPILLARY: Glucose-Capillary: 129 mg/dL — ABNORMAL HIGH (ref 70–99)

## 2019-12-01 SURGERY — LEFT HEART CATH AND CORS/GRAFTS ANGIOGRAPHY
Anesthesia: LOCAL

## 2019-12-01 MED ORDER — VERAPAMIL HCL 2.5 MG/ML IV SOLN
INTRAVENOUS | Status: DC | PRN
  Administered 2019-12-01: 10 mL via INTRA_ARTERIAL

## 2019-12-01 MED ORDER — SODIUM CHLORIDE 0.9% FLUSH: 3.0000 mL | INTRAVENOUS | Status: DC | PRN

## 2019-12-01 MED ORDER — SODIUM CHLORIDE 0.9 % IV SOLN: 250.0000 mL | INTRAVENOUS | Status: DC | PRN

## 2019-12-01 MED ORDER — LIDOCAINE HCL (PF) 1 % IJ SOLN
INTRAMUSCULAR | Status: AC
  Filled 2019-12-01: qty 30

## 2019-12-01 MED ORDER — VERAPAMIL HCL 2.5 MG/ML IV SOLN
INTRAVENOUS | Status: AC
  Filled 2019-12-01: qty 2

## 2019-12-01 MED ORDER — FENTANYL CITRATE (PF) 100 MCG/2ML IJ SOLN
INTRAMUSCULAR | Status: DC | PRN
  Administered 2019-12-01 (×2): 25 ug via INTRAVENOUS

## 2019-12-01 MED ORDER — IOHEXOL 350 MG/ML SOLN
INTRAVENOUS | Status: DC | PRN
  Administered 2019-12-01: 100 mL

## 2019-12-01 MED ORDER — HEPARIN SODIUM (PORCINE) 1000 UNIT/ML IJ SOLN
INTRAMUSCULAR | Status: DC | PRN
  Administered 2019-12-01: 4500 [IU] via INTRAVENOUS

## 2019-12-01 MED ORDER — FENTANYL CITRATE (PF) 100 MCG/2ML IJ SOLN
INTRAMUSCULAR | Status: AC
  Filled 2019-12-01: qty 2

## 2019-12-01 MED ORDER — HEPARIN SODIUM (PORCINE) 1000 UNIT/ML IJ SOLN
INTRAMUSCULAR | Status: AC
  Filled 2019-12-01: qty 1

## 2019-12-01 MED ORDER — SODIUM CHLORIDE 0.9 % WEIGHT BASED INFUSION
3.0000 mL/kg/h | INTRAVENOUS | Status: AC
  Administered 2019-12-01: 3 mL/kg/h via INTRAVENOUS

## 2019-12-01 MED ORDER — MIDAZOLAM HCL 2 MG/2ML IJ SOLN
INTRAMUSCULAR | Status: DC | PRN
  Administered 2019-12-01 (×2): 1 mg via INTRAVENOUS

## 2019-12-01 MED ORDER — ISOSORBIDE MONONITRATE ER 30 MG PO TB24
30.0000 mg | ORAL_TABLET | Freq: Every day | ORAL | 11 refills | Status: DC
Start: 2019-12-01 — End: 2019-12-18

## 2019-12-01 MED ORDER — SODIUM CHLORIDE 0.9 % WEIGHT BASED INFUSION: 1.0000 mL/kg/h | INTRAVENOUS | Status: DC

## 2019-12-01 MED ORDER — HEPARIN (PORCINE) IN NACL 1000-0.9 UT/500ML-% IV SOLN
INTRAVENOUS | Status: AC
  Filled 2019-12-01: qty 500

## 2019-12-01 MED ORDER — ONDANSETRON HCL 4 MG/2ML IJ SOLN: 4.0000 mg | Freq: Four times a day (QID) | INTRAMUSCULAR | Status: DC | PRN

## 2019-12-01 MED ORDER — LIDOCAINE HCL (PF) 1 % IJ SOLN
INTRAMUSCULAR | Status: DC | PRN
  Administered 2019-12-01: 2 mL

## 2019-12-01 MED ORDER — IOHEXOL 350 MG/ML SOLN
INTRAVENOUS | Status: AC
  Filled 2019-12-01: qty 1

## 2019-12-01 MED ORDER — HEPARIN (PORCINE) IN NACL 1000-0.9 UT/500ML-% IV SOLN
INTRAVENOUS | Status: DC | PRN
  Administered 2019-12-01 (×2): 500 mL

## 2019-12-01 MED ORDER — ASPIRIN 81 MG PO CHEW: 81.0000 mg | CHEWABLE_TABLET | ORAL | Status: DC

## 2019-12-01 MED ORDER — MIDAZOLAM HCL 2 MG/2ML IJ SOLN
INTRAMUSCULAR | Status: AC
  Filled 2019-12-01: qty 2

## 2019-12-01 MED ORDER — ACETAMINOPHEN 325 MG PO TABS: 650.0000 mg | ORAL_TABLET | ORAL | Status: DC | PRN

## 2019-12-01 MED ORDER — SODIUM CHLORIDE 0.9% FLUSH: 3.0000 mL | Freq: Two times a day (BID) | INTRAVENOUS | Status: DC

## 2019-12-01 SURGICAL SUPPLY — 14 items
CATH INFINITI 5 FR AL2 (CATHETERS) ×1 IMPLANT
CATH INFINITI 5 FR RCB (CATHETERS) ×1 IMPLANT
CATH INFINITI 5FR AL1 (CATHETERS) ×1 IMPLANT
CATH INFINITI 5FR MULTPACK ANG (CATHETERS) ×1 IMPLANT
DEVICE RAD COMP TR BAND LRG (VASCULAR PRODUCTS) ×1 IMPLANT
GLIDESHEATH SLEND SS 6F .021 (SHEATH) ×1 IMPLANT
GUIDEWIRE INQWIRE 1.5J.035X260 (WIRE) IMPLANT
INQWIRE 1.5J .035X260CM (WIRE) ×2
KIT HEART LEFT (KITS) ×2 IMPLANT
PACK CARDIAC CATHETERIZATION (CUSTOM PROCEDURE TRAY) ×2 IMPLANT
SHEATH PROBE COVER 6X72 (BAG) ×1 IMPLANT
SYR MEDRAD MARK 7 150ML (SYRINGE) ×2 IMPLANT
TRANSDUCER W/STOPCOCK (MISCELLANEOUS) ×2 IMPLANT
TUBING CIL FLEX 10 FLL-RA (TUBING) ×2 IMPLANT

## 2019-12-01 NOTE — Discharge Instructions (Addendum)
Radial Site Care  This sheet gives you information about how to care for yourself after your procedure. Your health care provider may also give you more specific instructions. If you have problems or questions, contact your health care provider. What can I expect after the procedure? After the procedure, it is common to have:  Bruising and tenderness at the catheter insertion area. Follow these instructions at home: Medicines  Take over-the-counter and prescription medicines only as told by your health care provider. Insertion site care  Follow instructions from your health care provider about how to take care of your insertion site. Make sure you: ? Wash your hands with soap and water before you change your bandage (dressing). If soap and water are not available, use hand sanitizer. ? Change your dressing as told by your health care provider. ? Leave stitches (sutures), skin glue, or adhesive strips in place. These skin closures may need to stay in place for 2 weeks or longer. If adhesive strip edges start to loosen and curl up, you may trim the loose edges. Do not remove adhesive strips completely unless your health care provider tells you to do that.  Check your insertion site every day for signs of infection. Check for: ? Redness, swelling, or pain. ? Fluid or blood. ? Pus or a bad smell. ? Warmth.  Do not take baths, swim, or use a hot tub until your health care provider approves.  You may shower 24-48 hours after the procedure, or as directed by your health care provider. ? Remove the dressing and gently wash the site with plain soap and water. ? Pat the area dry with a clean towel. ? Do not rub the site. That could cause bleeding.  Do not apply powder or lotion to the site. Activity   For 24 hours after the procedure, or as directed by your health care provider: ? Do not flex or bend the affected arm. ? Do not push or pull heavy objects with the affected arm. ? Do not  drive yourself home from the hospital or clinic. You may drive 24 hours after the procedure unless your health care provider tells you not to. ? Do not operate machinery or power tools.  Do not lift anything that is heavier than 10 lb (4.5 kg), or the limit that you are told, until your health care provider says that it is safe.  Ask your health care provider when it is okay to: ? Return to work or school. ? Resume usual physical activities or sports. ? Resume sexual activity. General instructions  If the catheter site starts to bleed, raise your arm and put firm pressure on the site. If the bleeding does not stop, get help right away. This is a medical emergency.  If you went home on the same day as your procedure, a responsible adult should be with you for the first 24 hours after you arrive home.  Keep all follow-up visits as told by your health care provider. This is important. Contact a health care provider if:  You have a fever.  You have redness, swelling, or yellow drainage around your insertion site. Get help right away if:  You have unusual pain at the radial site.  The catheter insertion area swells very fast.  The insertion area is bleeding, and the bleeding does not stop when you hold steady pressure on the area.  Your arm or hand becomes pale, cool, tingly, or numb. These symptoms may represent a serious problem   that is an emergency. Do not wait to see if the symptoms will go away. Get medical help right away. Call your local emergency services (911 in the [place] Do not drive yourself to the hospital. Summary  After the procedure, it is common to have bruising and tenderness at the site.  Follow instructions from your health care provider about how to take care of your radial site wound. Check the wound every day for signs of infection.  Do not lift anything that is heavier than 10 lb (4.5 kg), or the limit that you are told, until your health care provider says  that it is safe. This information is not intended to replace advice given to you by your health care provider. Make sure you discuss any questions you have with your health care provider. Document Revised: 06/09/2017 Document Reviewed: 06/09/2017 Elsevier Patient Education  [place] taking Imdur 30 mg daily.

## 2019-12-01 NOTE — Interval H&P Note (Signed)
History and Physical Interval Note:  12/01/2019 7:17 AM  Kevin Webb  has presented today for surgery, with the diagnosis of chest pain.  The various methods of treatment have been discussed with the patient and family. After consideration of risks, benefits and other options for treatment, the patient has consented to  Procedure(s): LEFT HEART CATH AND CORS/GRAFTS ANGIOGRAPHY (N/A) as a surgical intervention.  The patient's history has been reviewed, patient examined, no change in status, stable for surgery.  I have reviewed the patient's chart and labs.  Questions were answered to the patient's satisfaction.   Cath Lab Visit (complete for each Cath Lab visit)  Clinical Evaluation Leading to the Procedure:   ACS: Yes.    Non-ACS:    Anginal Classification: CCS III  Anti-ischemic medical therapy: Maximal Therapy (2 or more classes of medications)  Non-Invasive Test Results: Low-risk stress test findings: cardiac mortality <1%/year  Prior CABG: Previous CABG        Collier Salina [facility] 12/01/2019 7:17 AM

## 2019-12-17 NOTE — Progress Notes (Signed)
Cardiology Office Note   Date:  12/18/2019   ID:  Kevin Webb, DOB February 15, 1946, MRN [id number]  PCP:  McLean-Scocuzza, Nino Glow, MD  Cardiologist:   Minus Breeding, MD   Chief Complaint  Patient presents with  . Shortness of Breath      History of Present Illness: Kevin Webb is a 74 y.o. male who presents for evaluation of CAD. He was admitted from 8/10- 12/29/2016 for evaluation of chest pain with elevated troponin. Cardiac catheterization on 12/28/2016 demonstrated severe native disease but the LIMA-LAD was patent with 75% stenosis noted along the SVG-RCA and 90% stenosis along the SVG-RI. Successful PCI with DES placement was performed to both lesions. He was started on DAPT with ASA and Brilinta along with being continued on BB and statin therapy.  He has had continued chest pain and had cath last month.  The SVGs to RCA and RI are now occluded.    He has had no new cardiovascular complaints.  He does get dyspnea with exertion.  He might take an extra Imdur when he does this.  He denies any new cardiovascular chest pressure, neck or arm discomfort.  Not having any new palpitations, presyncope or syncope.  Said no PND or orthopnea.  Past Medical History:  Diagnosis Date  . Acute cholecystitis 04/18/2013  . Adenomatous colon polyp 12/2004  . Anemia   . Blood transfusion without reported diagnosis   . BPH associated with nocturia   . CAD (coronary artery disease)    a. s/p CABG x4 in 2000 with LIMA-LAD, reverse SVG-IM, reverse SVG-acute margin and reverse SVG-RCA b. s/p BMS to SVG-RI in 2012 c. 12/2016: PCI/DES to SVG-RCA and PCI/DES to SVG-RI (Dr. [name])   . Carotid artery stenosis    mild   . Cataract   . Clotting disorder ([place])   . Diabetes mellitus without complication ([place]) 44/07/4740   recent A1C 6.1 12/25/16 controlled with diet and exercise   . Diverticulitis 2008/2009  . GERD (gastroesophageal reflux disease)    controlled as of 04/01/17   . HSV infection   .  HTN (hypertension)   . Hx of dysplastic nevus 05/04/2018   upper back spinal   . Hyperglycemia   . Hyperlipidemia   . Impingement syndrome of left shoulder 2016  . Low testosterone    Dr [name], [place] ,[place]  . Myocardial infarction ([place])   . Osteoarthritis of right knee    With trace effusion  . Peyronie disease   . RLS (restless legs syndrome) 08/15/2013  . Sinus bradycardia   . Thrombocytopenia ([place])   . Vitamin D deficiency     Past Surgical History:  Procedure Laterality Date  . CHOLECYSTECTOMY N/A 04/21/2013   Procedure: LAPAROSCOPIC CHOLECYSTECTOMY WITH INTRAOPERATIVE CHOLANGIOGRAM;  Surgeon: Haywood Lasso, MD;  Location: [facility];  Service: General;  Laterality: N/A;  . CHOLECYSTECTOMY    . COLONOSCOPY W/ POLYPECTOMY  2004 & 2009    X 2; Dr Fuller Plan; due 2014  . CORONARY ANGIOPLASTY    . CORONARY ARTERY BYPASS GRAFT  04/1999   4 vessels  . CORONARY STENT INTERVENTION N/A 12/28/2016   Procedure: CORONARY STENT INTERVENTION;  Surgeon: Lorretta Harp, MD;  Location: [facility];  Service: Cardiovascular;  Laterality: N/A;  . CORONARY STENT PLACEMENT  2012   Plavix  . LAPAROSCOPIC CHOLECYSTECTOMY  04/21/2013   Dr Margot Chimes; acutecholecystitis with necrosis  . LEFT HEART CATH AND CORS/GRAFTS ANGIOGRAPHY N/A 12/28/2016   Procedure: LEFT  HEART CATH AND CORS/GRAFTS ANGIOGRAPHY;  Surgeon: Lorretta Harp, MD;  Location: [facility];  Service: Cardiovascular;  Laterality: N/A;  . LEFT HEART CATH AND CORS/GRAFTS ANGIOGRAPHY N/A 12/01/2019   Procedure: LEFT HEART CATH AND CORS/GRAFTS ANGIOGRAPHY;  Surgeon: Martinique, Peter M, MD;  Location: [facility];  Service: Cardiovascular;  Laterality: N/A;  . VASECTOMY       Current Outpatient Medications  Medication Sig Dispense Refill  . amLODipine (NORVASC) 5 MG tablet TAKE ONE (1) TABLET EACH DAY (Patient taking differently: Take 5 mg by mouth daily at 2 am. ) 90 tablet 3  . Ascorbic Acid (VITAMIN C) 1000 MG tablet  Take 1,000 mg by mouth daily at 2 am. Reported on 09/26/2015    . aspirin EC 81 MG tablet Take 81 mg by mouth daily at 2 am. Swallow whole.    . cholecalciferol (VITAMIN D) 25 MCG (1000 UNIT) tablet Take 1,000 Units by mouth daily at 2 am.    . clopidogrel (PLAVIX) 75 MG tablet Take 1 tablet (75 mg total) by mouth daily. (Patient taking differently: Take 75 mg by mouth daily at 2 am. ) 90 tablet 3  . Coenzyme Q10 (CO Q-10 PO) Take 1 capsule by mouth daily at 2 am.    . ezetimibe (ZETIA) 10 MG tablet Take 1 tablet (10 mg total) by mouth daily. (Patient taking differently: Take 10 mg by mouth daily at 2 am. ) 90 tablet 3  . GINKGO BILOBA PO Take 1 capsule by mouth daily at 2 am.     . isosorbide mononitrate (IMDUR) 60 MG 24 hr tablet Take 1 tablet (60 mg total) by mouth daily. 90 tablet 3  . MAGNESIUM PO Take 1 tablet by mouth daily at 2 am.    . metoprolol succinate (TOPROL XL) 25 MG 24 hr tablet Take 1 tablet (25 mg total) by mouth daily. (Patient taking differently: Take 25 mg by mouth daily at 2 am. ) 90 tablet 3  . Multiple Vitamin (MULTIVITAMIN WITH MINERALS) TABS tablet Take 1 tablet by mouth daily at 2 am.    . naproxen sodium (ALEVE) 220 MG tablet Take 220 mg by mouth 2 (two) times daily as needed (back pain.).    Marland Kitchen nitroGLYCERIN (NITROSTAT) 0.4 MG SL tablet Place 1 tablet (0.4 mg total) under the tongue every 5 (five) minutes x 3 doses as needed. (Patient taking differently: Place 0.4 mg under the tongue every 5 (five) minutes x 3 doses as needed for chest pain. ) 25 tablet 4  . Omega-3 Fatty Acids (FISH OIL) 1000 MG CAPS Take 1,000 mg by mouth daily at 2 am. 1 tab po qd     . pantoprazole (PROTONIX) 40 MG tablet Take 1 tablet (40 mg total) by mouth 2 (two) times daily before a meal. 30 minutes. Try to reduce to 1x per day now or in 3 months (Patient taking differently: Take 40 mg by mouth daily at 2 am. 30 minutes. Try to reduce to 1x per day now or in 3 months) 180 tablet 3  . ramipril  (ALTACE) 10 MG capsule Take 1 capsule (10 mg total) by mouth daily. (Patient taking differently: Take 10 mg by mouth daily at 2 am. ) 90 capsule 3  . rosuvastatin (CRESTOR) 40 MG tablet Take 1 tablet (40 mg total) by mouth at bedtime. (Patient taking differently: Take 40 mg by mouth daily at 2 am. ) 90 tablet 3  . tamsulosin (FLOMAX) 0.4 MG  CAPS capsule Take 0.4 mg by mouth daily at 2 am.     . vitamin E 100 UNIT capsule Take 100 Units by mouth daily at 2 am.      Current Facility-Administered Medications  Medication Dose Route Frequency Provider Last Rate Last Admin  . sodium chloride flush (NS) 0.9 % injection 3 mL  3 mL Intravenous Q12H Almyra Deforest, PA        Allergies:   Darvocet [propoxyphene n-acetaminophen] and Propoxyphene    ROS:  Please see the history of present illness.   Otherwise, review of systems are positive for none.   All other systems are reviewed and negative.    PHYSICAL EXAM: VS:  BP 120/80 (BP Location: Left Arm, Patient Position: Sitting, Cuff Size: Normal)   Pulse 71   Ht 5\' 9"  (1.753 m)   Wt (!) 211 lb (95.7 kg)   SpO2 95%   BMI 31.16 kg/m  , BMI Body mass index is 31.16 kg/m. GENERAL:  Well appearing NECK:  No jugular venous distention, waveform within normal limits, carotid upstroke brisk and symmetric, no bruits, no thyromegaly LUNGS:  Clear to auscultation bilaterally CHEST:  Well healed sternotomy scar. HEART:  PMI not displaced or sustained,S1 and S2 within normal limits, no S3, no S4, no clicks, no rubs, no murmurs ABD:  Flat, positive bowel sounds normal in frequency in pitch, no bruits, no rebound, no guarding, no midline pulsatile mass, no hepatomegaly, no splenomegaly EXT:  2 plus pulses throughout, no edema, no cyanosis no clubbing   EKG:  EKG not ordered today.   Recent Labs: 07/18/2019: TSH 1.40 10/24/2019: ALT 24 11/28/2019: BUN 10; Creatinine, Ser 0.82; Hemoglobin 14.5; Platelets 163; Potassium 4.7; Sodium 138   Lab Results  Component  Value Date   HGBA1C 7.0 (H) 10/24/2019     Lipid Panel    Component Value Date/Time   CHOL 150 10/24/2019 0912   TRIG 224.0 (H) 10/24/2019 0912   HDL 43.70 10/24/2019 0912   CHOLHDL 3 10/24/2019 0912   VLDL 44.8 (H) 10/24/2019 0912   LDLCALC 56 07/18/2019 0956   LDLDIRECT 74.0 10/24/2019 0912      Wt Readings from Last 3 Encounters:  12/18/19 (!) 211 lb (95.7 kg)  12/01/19 203 lb (92.1 kg)  11/27/19 203 lb (92.1 kg)     Cath   12/01/19    Ost LAD to Prox LAD lesion is 100% stenosed.  1st Diag lesion is 60% stenosed.  Ramus lesion is 100% stenosed.  Prox Cx lesion is 40% stenosed.  Prox RCA lesion is 100% stenosed.  Mid RCA lesion is 100% stenosed.  Prox Graft lesion is 100% stenosed.  Prox Graft lesion is 100% stenosed.  Origin to Prox Graft lesion is 100% stenosed.  The left ventricular systolic function is normal.  LV end diastolic pressure is normal.  The left ventricular ejection fraction is 55-65% by visual estimate.   1. Severe 3 vessel occlusive CAD involving the ostial LAD, ostial ramus intermediate and proximal RCA. 2. Occluded SVG to PDA 3. Occluded SVG to ramus intermediate 4. Patent LIMA to the LAD.  5. Normal LV function 6. Normal LVEDP   Other studies Reviewed: Additional studies/ records that were reviewed today include: Labs Review of the above records demonstrates:  Please see elsewhere in the note.     ASSESSMENT AND PLAN:   CAD/CABG-  He has disease as above.  We had a long discussion about lifestyle.  In particular we talked about his  high stress job and the fact that he works too much.  We talked about the food choices and he is doing better with this.  We talked about alcohol intake and how to limit this.  I think he is much more on board with risk reduction as he understands he had progression of his disease that cannot otherwise be managed except with risk reduction.  I am going to change his Imdur to 60 mg daily.  I  reviewed with him the films and went over the pictures and diagrams.  DIZZINESS: He has some mild orthostatic symptoms but he was not orthostatic in the office previously.  No change in therapy.  HYPERLIPIDEMIA -  Goal LDL is less than 70.  He can continue meds as listed.  HYPERTENSION -  Blood pressures is controlled.  He will continue the meds as listed.   DM  A1c was 7.  His primary provider has suggested Metformin and I agree and asked him to follow through with very tight blood sugar control.   CAROTID DISEASE This was mild bilaterally last year.  No further imaging.    COVID EDUCATION:   He has not yet had the vaccine.  I again encouraged him to get this.  Current medicines are reviewed at length with the patient today.  The patient does not have concerns regarding medicines.  The following changes have been made:  As above  Labs/ tests ordered today include:    None  No orders of the defined types were placed in this encounter.    Disposition:   FU with me in in 4 months.   Signed, Minus Breeding, MD  12/18/2019 11:50 AM    [place]

## 2019-12-18 ENCOUNTER — Ambulatory Visit (INDEPENDENT_AMBULATORY_CARE_PROVIDER_SITE_OTHER): Payer: Medicare Other | Admitting: Cardiology

## 2019-12-18 ENCOUNTER — Encounter: Payer: Self-pay | Admitting: Cardiology

## 2019-12-18 ENCOUNTER — Other Ambulatory Visit: Payer: Self-pay

## 2019-12-18 VITALS — BP 120/80 | HR 71 | Ht 69.0 in | Wt 211.0 lb

## 2019-12-18 DIAGNOSIS — I25119 Atherosclerotic heart disease of native coronary artery with unspecified angina pectoris: Secondary | ICD-10-CM | POA: Diagnosis not present

## 2019-12-18 DIAGNOSIS — I25700 Atherosclerosis of coronary artery bypass graft(s), unspecified, with unstable angina pectoris: Secondary | ICD-10-CM

## 2019-12-18 DIAGNOSIS — E785 Hyperlipidemia, unspecified: Secondary | ICD-10-CM

## 2019-12-18 DIAGNOSIS — Z7189 Other specified counseling: Secondary | ICD-10-CM | POA: Diagnosis not present

## 2019-12-18 DIAGNOSIS — E118 Type 2 diabetes mellitus with unspecified complications: Secondary | ICD-10-CM | POA: Diagnosis not present

## 2019-12-18 MED ORDER — ISOSORBIDE MONONITRATE ER 60 MG PO TB24
60.0000 mg | ORAL_TABLET | Freq: Every day | ORAL | 3 refills | Status: DC
Start: 2019-12-18 — End: 2021-03-24

## 2019-12-18 NOTE — Patient Instructions (Signed)
Medication Instructions:   INCREASE Imdur to 60 mg daily  *If you need a refill on your cardiac medications before your next appointment, please call your pharmacy*  Lab Work: NONE ordered at this time of appointment   If you have labs (blood work) drawn today and your tests are completely normal, you will receive your results only by: Marland Kitchen MyChart Message (if you have MyChart) OR . A paper copy in the mail If you have any lab test that is abnormal or we need to change your treatment, we will call you to review the results.  Testing/Procedures: NONE ordered at this time of appointment   Follow-Up: At [facility], you and your health needs are our priority.  As part of our continuing mission to provide you with exceptional heart care, we have created designated Provider Care Teams.  These Care Teams include your primary Cardiologist (physician) and Advanced Practice Providers (APPs -  Physician Assistants and Nurse Practitioners) who all work together to provide you with the care you need, when you need it.  Your next appointment:   4 month(s)  The format for your next appointment:   In Person  Provider:   Minus Breeding, MD  Other Instructions

## 2020-01-15 DIAGNOSIS — H25813 Combined forms of age-related cataract, bilateral: Secondary | ICD-10-CM | POA: Diagnosis not present

## 2020-01-19 ENCOUNTER — Observation Stay (HOSPITAL_COMMUNITY): Payer: Medicare Other

## 2020-01-19 ENCOUNTER — Observation Stay (HOSPITAL_COMMUNITY)
Admission: EM | Admit: 2020-01-19 | Discharge: 2020-01-20 | Disposition: A | Discharge status: Home / Self Care | Payer: Medicare Other | Attending: Family Medicine | Admitting: Family Medicine

## 2020-01-19 ENCOUNTER — Telehealth: Payer: Self-pay | Admitting: Internal Medicine

## 2020-01-19 DIAGNOSIS — M6281 Muscle weakness (generalized): Secondary | ICD-10-CM | POA: Diagnosis present

## 2020-01-19 DIAGNOSIS — Z955 Presence of coronary angioplasty implant and graft: Secondary | ICD-10-CM | POA: Diagnosis not present

## 2020-01-19 DIAGNOSIS — Z7982 Long term (current) use of aspirin: Secondary | ICD-10-CM | POA: Insufficient documentation

## 2020-01-19 DIAGNOSIS — M4802 Spinal stenosis, cervical region: Secondary | ICD-10-CM | POA: Diagnosis not present

## 2020-01-19 DIAGNOSIS — Z20822 Contact with and (suspected) exposure to covid-19: Secondary | ICD-10-CM | POA: Diagnosis not present

## 2020-01-19 DIAGNOSIS — K219 Gastro-esophageal reflux disease without esophagitis: Secondary | ICD-10-CM | POA: Insufficient documentation

## 2020-01-19 DIAGNOSIS — E119 Type 2 diabetes mellitus without complications: Secondary | ICD-10-CM | POA: Diagnosis not present

## 2020-01-19 DIAGNOSIS — I252 Old myocardial infarction: Secondary | ICD-10-CM | POA: Diagnosis not present

## 2020-01-19 DIAGNOSIS — R42 Dizziness and giddiness: Secondary | ICD-10-CM | POA: Diagnosis not present

## 2020-01-19 DIAGNOSIS — I6523 Occlusion and stenosis of bilateral carotid arteries: Secondary | ICD-10-CM | POA: Diagnosis not present

## 2020-01-19 DIAGNOSIS — I10 Essential (primary) hypertension: Secondary | ICD-10-CM | POA: Insufficient documentation

## 2020-01-19 DIAGNOSIS — I2510 Atherosclerotic heart disease of native coronary artery without angina pectoris: Secondary | ICD-10-CM | POA: Insufficient documentation

## 2020-01-19 DIAGNOSIS — G459 Transient cerebral ischemic attack, unspecified: Secondary | ICD-10-CM | POA: Diagnosis not present

## 2020-01-19 DIAGNOSIS — Z79899 Other long term (current) drug therapy: Secondary | ICD-10-CM | POA: Insufficient documentation

## 2020-01-19 DIAGNOSIS — I25118 Atherosclerotic heart disease of native coronary artery with other forms of angina pectoris: Secondary | ICD-10-CM | POA: Diagnosis present

## 2020-01-19 DIAGNOSIS — E785 Hyperlipidemia, unspecified: Secondary | ICD-10-CM | POA: Diagnosis not present

## 2020-01-19 DIAGNOSIS — R000 Tachycardia, unspecified: Secondary | ICD-10-CM | POA: Diagnosis not present

## 2020-01-19 DIAGNOSIS — M542 Cervicalgia: Secondary | ICD-10-CM

## 2020-01-19 HISTORY — DX: Transient cerebral ischemic attack, unspecified: G45.9

## 2020-01-19 LAB — DIFFERENTIAL
Abs Immature Granulocytes: 0.03 10*3/uL (ref 0.00–0.07)
Basophils Absolute: 0 10*3/uL (ref 0.0–0.1)
Basophils Relative: 0 %
Eosinophils Absolute: 0 10*3/uL (ref 0.0–0.5)
Eosinophils Relative: 0 %
Immature Granulocytes: 1 %
Lymphocytes Relative: 15 %
Lymphs Abs: 0.9 10*3/uL (ref 0.7–4.0)
Monocytes Absolute: 0.4 10*3/uL (ref 0.1–1.0)
Monocytes Relative: 7 %
Neutro Abs: 4.5 10*3/uL (ref 1.7–7.7)
Neutrophils Relative %: 77 %

## 2020-01-19 LAB — I-STAT CHEM 8, ED
BUN: 11 mg/dL (ref 8–23)
Calcium, Ion: 1.18 mmol/L (ref 1.15–1.40)
Chloride: 104 mmol/L (ref 98–111)
Creatinine, Ser: 0.7 mg/dL (ref 0.61–1.24)
Glucose, Bld: 112 mg/dL — ABNORMAL HIGH (ref 70–99)
HCT: 40 % (ref 39.0–52.0)
Hemoglobin: 13.6 g/dL (ref 13.0–17.0)
Potassium: 3.9 mmol/L (ref 3.5–5.1)
Sodium: 140 mmol/L (ref 135–145)
TCO2: 25 mmol/L (ref 22–32)

## 2020-01-19 LAB — URINALYSIS, ROUTINE W REFLEX MICROSCOPIC
Bilirubin Urine: NEGATIVE
Glucose, UA: NEGATIVE mg/dL
Hgb urine dipstick: NEGATIVE
Ketones, ur: NEGATIVE mg/dL
Leukocytes,Ua: NEGATIVE
Nitrite: NEGATIVE
Protein, ur: NEGATIVE mg/dL
Specific Gravity, Urine: 1.01 (ref 1.005–1.030)
pH: 5.5 (ref 5.0–8.0)

## 2020-01-19 LAB — COMPREHENSIVE METABOLIC PANEL
ALT: 25 U/L (ref 0–44)
AST: 33 U/L (ref 15–41)
Albumin: 3.9 g/dL (ref 3.5–5.0)
Alkaline Phosphatase: 39 U/L (ref 38–126)
Anion gap: 10 (ref 5–15)
BUN: 10 mg/dL (ref 8–23)
CO2: 23 mmol/L (ref 22–32)
Calcium: 9.1 mg/dL (ref 8.9–10.3)
Chloride: 104 mmol/L (ref 98–111)
Creatinine, Ser: 0.89 mg/dL (ref 0.61–1.24)
GFR calc Af Amer: >60 mL/min (ref >60)
GFR calc non Af Amer: >60 mL/min (ref >60)
Glucose, Bld: 118 mg/dL — ABNORMAL HIGH (ref 70–99)
Potassium: 4 mmol/L (ref 3.5–5.1)
Sodium: 137 mmol/L (ref 135–145)
Total Bilirubin: 0.8 mg/dL (ref 0.3–1.2)
Total Protein: 6.5 g/dL (ref 6.5–8.1)

## 2020-01-19 LAB — CBC
HCT: 42.2 % (ref 39.0–52.0)
Hemoglobin: 14.1 g/dL (ref 13.0–17.0)
MCH: 31.5 pg (ref 26.0–34.0)
MCHC: 33.4 g/dL (ref 30.0–36.0)
MCV: 94.4 fL (ref 80.0–100.0)
Platelets: 162 10*3/uL (ref 150–400)
RBC: 4.47 MIL/uL (ref 4.22–5.81)
RDW: 12.6 % (ref 11.5–15.5)
WBC: 5.8 10*3/uL (ref 4.0–10.5)
nRBC: 0 % (ref 0.0–0.2)

## 2020-01-19 LAB — RAPID URINE DRUG SCREEN, HOSP PERFORMED
Amphetamines: NOT DETECTED
Barbiturates: NOT DETECTED
Benzodiazepines: NOT DETECTED
Cocaine: NOT DETECTED
Opiates: NOT DETECTED
Tetrahydrocannabinol: NOT DETECTED

## 2020-01-19 LAB — ETHANOL: Alcohol, Ethyl (B): <10 mg/dL (ref <10)

## 2020-01-19 LAB — PROTIME-INR
INR: 1.1 (ref 0.8–1.2)
Prothrombin Time: 13.4 seconds (ref 11.4–15.2)

## 2020-01-19 LAB — APTT: aPTT: 27 seconds (ref 24–36)

## 2020-01-19 LAB — SARS CORONAVIRUS 2 BY RT PCR (HOSPITAL ORDER, PERFORMED IN ~~LOC~~ HOSPITAL LAB): SARS Coronavirus 2: NEGATIVE

## 2020-01-19 MED ORDER — METOPROLOL SUCCINATE ER 25 MG PO TB24
25.0000 mg | ORAL_TABLET | Freq: Every day | ORAL | Status: DC
  Administered 2020-01-20: 25 mg via ORAL
  Filled 2020-01-19: qty 1

## 2020-01-19 MED ORDER — ISOSORBIDE MONONITRATE ER 60 MG PO TB24
60.0000 mg | ORAL_TABLET | Freq: Every day | ORAL | Status: DC
  Administered 2020-01-20: 60 mg via ORAL
  Filled 2020-01-19: qty 1

## 2020-01-19 MED ORDER — ENOXAPARIN SODIUM 40 MG/0.4ML ~~LOC~~ SOLN
40.0000 mg | SUBCUTANEOUS | Status: DC
  Administered 2020-01-20: 40 mg via SUBCUTANEOUS
  Filled 2020-01-19: qty 0.4

## 2020-01-19 MED ORDER — INSULIN ASPART 100 UNIT/ML ~~LOC~~ SOLN: 0.0000 [IU] | Freq: Three times a day (TID) | SUBCUTANEOUS | Status: DC

## 2020-01-19 MED ORDER — ASPIRIN EC 81 MG PO TBEC: 81.0000 mg | DELAYED_RELEASE_TABLET | Freq: Every day | ORAL | Status: DC

## 2020-01-19 MED ORDER — STROKE: EARLY STAGES OF RECOVERY BOOK
Freq: Once | Status: DC
  Filled 2020-01-19: qty 1

## 2020-01-19 MED ORDER — CLOPIDOGREL BISULFATE 75 MG PO TABS
75.0000 mg | ORAL_TABLET | Freq: Every day | ORAL | Status: DC
  Administered 2020-01-20: 75 mg via ORAL
  Filled 2020-01-19: qty 1

## 2020-01-19 MED ORDER — ACETAMINOPHEN 325 MG PO TABS: 650.0000 mg | ORAL_TABLET | ORAL | Status: DC | PRN

## 2020-01-19 MED ORDER — ASPIRIN 325 MG PO TABS
325.0000 mg | ORAL_TABLET | Freq: Once | ORAL | Status: AC
  Administered 2020-01-19: 325 mg via ORAL
  Filled 2020-01-19: qty 1

## 2020-01-19 MED ORDER — ACETAMINOPHEN 160 MG/5ML PO SOLN: 650.0000 mg | ORAL | Status: DC | PRN

## 2020-01-19 MED ORDER — PANTOPRAZOLE SODIUM 40 MG PO TBEC
40.0000 mg | DELAYED_RELEASE_TABLET | Freq: Two times a day (BID) | ORAL | Status: DC
  Administered 2020-01-19 – 2020-01-20 (×2): 40 mg via ORAL
  Filled 2020-01-19 (×2): qty 2
  Filled 2020-01-19: qty 1

## 2020-01-19 MED ORDER — ACETAMINOPHEN 650 MG RE SUPP: 650.0000 mg | RECTAL | Status: DC | PRN

## 2020-01-19 MED ORDER — EZETIMIBE 10 MG PO TABS
10.0000 mg | ORAL_TABLET | Freq: Every day | ORAL | Status: DC
  Administered 2020-01-20: 10 mg via ORAL
  Filled 2020-01-19: qty 1

## 2020-01-19 MED ORDER — IOHEXOL 350 MG/ML SOLN
75.0000 mL | Freq: Once | INTRAVENOUS | Status: AC | PRN
  Administered 2020-01-19: 75 mL via INTRAVENOUS

## 2020-01-19 MED ORDER — ROSUVASTATIN CALCIUM 20 MG PO TABS: 40.0000 mg | ORAL_TABLET | Freq: Every day | ORAL | Status: DC

## 2020-01-19 NOTE — Telephone Encounter (Signed)
Pt called and said that he is at the ED now

## 2020-01-19 NOTE — ED Provider Notes (Signed)
[facility] EMERGENCY DEPARTMENT Provider Note   CSN: [id number] Arrival date & time: 01/19/20  1404     History Chief Complaint  Patient presents with  . Weakness    right arm  . Numbness    right arm    CURLY MACKOWSKI is a 74 y.o. male.  HPI      74yo male with history of CAD, mild carotid artery stenosis, DM, htn, hlpd, presents with concern for episodes of numbness and weakness to right arm.  Woke up around 430AM and felt normal however when he woke again at 6 he had numbness in his right arm like his right arm was asleep.  Reports that this lasted approximately 30 seconds and then felt back to normal. Reports had anotehr episode when he was reaching up to dial a phone and felt that his arm was heavy and weak--felt he just felt of. Reports brief episode of lightheadedness similar to what he has had before. Glucose normal at that time. No chest pain or dyspnea.  Denies difficulty walking/talking, leg weakness or numbness, facial droop.    Past Medical History:  Diagnosis Date  . Acute cholecystitis 04/18/2013  . Adenomatous colon polyp 12/2004  . Anemia   . Blood transfusion without reported diagnosis   . BPH associated with nocturia   . CAD (coronary artery disease)    a. s/p CABG x4 in 2000 with LIMA-LAD, reverse SVG-IM, reverse SVG-acute margin and reverse SVG-RCA b. s/p BMS to SVG-RI in 2012 c. 12/2016: PCI/DES to SVG-RCA and PCI/DES to SVG-RI (Dr. [name])   . Carotid artery stenosis    mild   . Cataract   . Clotting disorder ([place])   . Diabetes mellitus without complication ([place]) 16/11/3708   recent A1C 6.1 12/25/16 controlled with diet and exercise   . Diverticulitis 2008/2009  . GERD (gastroesophageal reflux disease)    controlled as of 04/01/17   . HSV infection   . HTN (hypertension)   . Hx of dysplastic nevus 05/04/2018   upper back spinal   . Hyperglycemia   . Hyperlipidemia   . Impingement syndrome of left shoulder 2016  . Low  testosterone    Dr [name], [place] ,[place]  . Myocardial infarction ([place])   . Osteoarthritis of right knee    With trace effusion  . Peyronie disease   . RLS (restless legs syndrome) 08/15/2013  . Sinus bradycardia   . Thrombocytopenia ([place])   . Vitamin D deficiency     Patient Active Problem List   Diagnosis Date Noted  . TIA (transient ischemic attack) 01/19/2020  . Unstable angina ([place]) 12/01/2019  . Piriformis syndrome of right side 09/22/2019  . Obesity (BMI 30-39.9) 09/22/2019  . Aortic atherosclerosis ([place]) 06/20/2019  . Diverticulosis 06/20/2019  . Kidney cysts 06/20/2019  . Low back pain 02/15/2019  . Educated about COVID-19 virus infection 10/06/2018  . Prediabetes 03/29/2018  . Cervicalgia 03/29/2018  . Prostatitis 12/23/2017  . Vitamin D deficiency   . Sinus bradycardia   . Peyronie disease   . Osteoarthritis of right knee   . Hyperlipidemia   . Gastroesophageal reflux disease   . Coronary artery disease involving native coronary artery of native heart without angina pectoris   . BPH associated with nocturia   . Anemia   . Hyperlipidemia LDL goal <70 01/08/2017  . NSTEMI (non-ST elevated myocardial infarction) ([place]) 12/25/2016  . Type 2 diabetes mellitus without complication, without long-term current use of insulin ([place]) 09/28/2016  .  Impingement syndrome of left shoulder 05/18/2014  . RLS (restless legs syndrome) 08/15/2013  . Overweight(278.02) 11/28/2010  . GERD 02/22/2007  . TESTOSTERONE DEFICIENCY 09/30/2006  . HTN (hypertension) 09/30/2006  . Coronary atherosclerosis of artery bypass graft 09/30/2006  . COLONIC POLYPS, HX OF 09/30/2006  . Adenomatous colon polyp 12/16/2004    Past Surgical History:  Procedure Laterality Date  . CHOLECYSTECTOMY N/A 04/21/2013   Procedure: LAPAROSCOPIC CHOLECYSTECTOMY WITH INTRAOPERATIVE CHOLANGIOGRAM;  Surgeon: Haywood Lasso, MD;  Location: [facility];  Service: General;  Laterality: N/A;  . CHOLECYSTECTOMY    .  COLONOSCOPY W/ POLYPECTOMY  2004 & 2009    X 2; Dr Fuller Plan; due 2014  . CORONARY ANGIOPLASTY    . CORONARY ARTERY BYPASS GRAFT  04/1999   4 vessels  . CORONARY STENT INTERVENTION N/A 12/28/2016   Procedure: CORONARY STENT INTERVENTION;  Surgeon: Lorretta Harp, MD;  Location: [facility];  Service: Cardiovascular;  Laterality: N/A;  . CORONARY STENT PLACEMENT  2012   Plavix  . LAPAROSCOPIC CHOLECYSTECTOMY  04/21/2013   Dr Margot Chimes; acutecholecystitis with necrosis  . LEFT HEART CATH AND CORS/GRAFTS ANGIOGRAPHY N/A 12/28/2016   Procedure: LEFT HEART CATH AND CORS/GRAFTS ANGIOGRAPHY;  Surgeon: Lorretta Harp, MD;  Location: [facility];  Service: Cardiovascular;  Laterality: N/A;  . LEFT HEART CATH AND CORS/GRAFTS ANGIOGRAPHY N/A 12/01/2019   Procedure: LEFT HEART CATH AND CORS/GRAFTS ANGIOGRAPHY;  Surgeon: Martinique, Peter M, MD;  Location: [facility];  Service: Cardiovascular;  Laterality: N/A;  . VASECTOMY         Family History  Problem Relation Age of Onset  . Aortic aneurysm Father 68       ? abdominal  . Heart attack Father 50  . Diabetes Other        PGaunt  . Coronary artery disease Mother        CABG in 65s  . CVA Mother        cns bleed from warfarin  . Aneurysm Mother   . Diabetes Brother   . Cholecystitis Sister   . Cancer Neg Hx   . Colon cancer Neg Hx   . Prostate cancer Neg Hx   . Esophageal cancer Neg Hx   . Stomach cancer Neg Hx   . Rectal cancer Neg Hx     Social History   Tobacco Use  . Smoking status: Never Smoker  . Smokeless tobacco: Never Used  Substance Use Topics  . Alcohol use: Yes    Alcohol/week: 10.0 - 12.0 standard drinks    Types: 10 - 12 Cans of beer per week    Comment: socially   . Drug use: No    Home Medications Prior to Admission medications   Medication Sig Start Date End Date Taking? Authorizing Provider  amLODipine (NORVASC) 5 MG tablet TAKE ONE (1) TABLET EACH DAY Patient taking differently: Take 5 mg  by mouth daily at 2 am.  09/22/19  Yes McLean-Scocuzza, Nino Glow, MD  Ascorbic Acid (VITAMIN C) 1000 MG tablet Take 1,000 mg by mouth daily at 2 am. Reported on 09/26/2015   Yes [provider]  aspirin EC 81 MG tablet Take 81 mg by mouth daily at 2 am. Swallow whole.   Yes [provider]  cholecalciferol (VITAMIN D) 25 MCG (1000 UNIT) tablet Take 1,000 Units by mouth daily at 2 am.   Yes [provider]  clopidogrel (PLAVIX) 75 MG tablet Take 1 tablet (75 mg total)  by mouth daily. Patient taking differently: Take 75 mg by mouth daily at 2 am.  09/22/19  Yes McLean-Scocuzza, Nino Glow, MD  Coenzyme Q10 (CO Q-10 PO) Take 1 capsule by mouth daily at 2 am.   Yes [provider]  ezetimibe (ZETIA) 10 MG tablet Take 1 tablet (10 mg total) by mouth daily. Patient taking differently: Take 10 mg by mouth daily at 2 am.  09/22/19  Yes McLean-Scocuzza, Nino Glow, MD  GINKGO BILOBA PO Take 1 capsule by mouth daily at 2 am.    Yes [provider]  isosorbide mononitrate (IMDUR) 60 MG 24 hr tablet Take 1 tablet (60 mg total) by mouth daily. 12/18/19  Yes Minus Breeding, MD  MAGNESIUM PO Take 1 tablet by mouth daily at 2 am.   Yes [provider]  metoprolol succinate (TOPROL XL) 25 MG 24 hr tablet Take 1 tablet (25 mg total) by mouth daily. Patient taking differently: Take 25 mg by mouth daily at 2 am.  09/22/19  Yes McLean-Scocuzza, Nino Glow, MD  Multiple Vitamin (MULTIVITAMIN WITH MINERALS) TABS tablet Take 1 tablet by mouth daily at 2 am.   Yes [provider]  naproxen sodium (ALEVE) 220 MG tablet Take 220 mg by mouth 2 (two) times daily as needed (back pain.).   Yes [provider]  niacin 250 MG tablet Take 250 mg by mouth daily.   Yes [provider]  nitroGLYCERIN (NITROSTAT) 0.4 MG SL tablet Place 1 tablet (0.4 mg total) under the tongue every 5 (five) minutes x 3 doses as needed. Patient taking differently: Place 0.4 mg under the tongue  every 5 (five) minutes x 3 doses as needed for chest pain.  09/22/19  Yes McLean-Scocuzza, Nino Glow, MD  Nutritional Supplements (BOOST GLUCOSE CONTROL PO) Take 1 capsule by mouth daily.   Yes [provider]  Omega-3 Fatty Acids (FISH OIL) 1000 MG CAPS Take 2,000 mg by mouth daily at 2 am.    Yes [provider]  pantoprazole (PROTONIX) 40 MG tablet Take 1 tablet (40 mg total) by mouth 2 (two) times daily before a meal. 30 minutes. Try to reduce to 1x per day now or in 3 months Patient taking differently: Take 40 mg by mouth daily at 2 am. 30 minutes. Try to reduce to 1x per day now or in 3 months 09/22/19  Yes McLean-Scocuzza, Nino Glow, MD  ramipril (ALTACE) 10 MG capsule Take 1 capsule (10 mg total) by mouth daily. Patient taking differently: Take 10 mg by mouth daily at 2 am.  09/22/19  Yes McLean-Scocuzza, Nino Glow, MD  rosuvastatin (CRESTOR) 40 MG tablet Take 1 tablet (40 mg total) by mouth at bedtime. Patient taking differently: Take 40 mg by mouth daily at 2 am.  09/22/19  Yes McLean-Scocuzza, Nino Glow, MD  tamsulosin (FLOMAX) 0.4 MG CAPS capsule Take 0.4 mg by mouth daily at 2 am.    Yes [provider]  vitamin E 100 UNIT capsule Take 100 Units by mouth daily at 2 am.    Yes [provider]    Allergies    Darvocet [propoxyphene n-acetaminophen] and Propoxyphene  Review of Systems   Review of Systems  Constitutional: Negative for fever.  HENT: Negative for sore throat.   Eyes: Negative for visual disturbance.  Respiratory: Negative for shortness of breath.   Cardiovascular: Negative for chest pain.  Gastrointestinal: Negative for abdominal pain.  Genitourinary: Negative for difficulty urinating.  Musculoskeletal: Negative for back pain  and neck stiffness.  Skin: Negative for rash.  Neurological: Positive for weakness, light-headedness and numbness. Negative for syncope, facial asymmetry, speech difficulty and headaches.    Physical Exam Updated Vital  Signs BP 140/83   Pulse 64   Temp 98.5 F (36.9 C) (Oral)   Resp (!) 22   Ht 5\' 9"  (1.753 m)   Wt 90.7 kg   SpO2 97%   BMI 29.53 kg/m   Physical Exam Vitals and nursing note reviewed.  Constitutional:      General: He is not in acute distress.    Appearance: He is well-developed. He is not diaphoretic.  HENT:     Head: Normocephalic and atraumatic.  Eyes:     General: No visual field deficit.    Conjunctiva/sclera: Conjunctivae normal.  Cardiovascular:     Rate and Rhythm: Normal rate and regular rhythm.     Heart sounds: Normal heart sounds. No murmur heard.  No friction rub. No gallop.   Pulmonary:     Effort: Pulmonary effort is normal. No respiratory distress.     Breath sounds: Normal breath sounds. No wheezing or rales.  Abdominal:     General: There is no distension.     Palpations: Abdomen is soft.     Tenderness: There is no abdominal tenderness. There is no guarding.  Musculoskeletal:     Cervical back: Normal range of motion.  Skin:    General: Skin is warm and dry.  Neurological:     Mental Status: He is alert and oriented to person, place, and time.     GCS: GCS eye subscore is 4. GCS verbal subscore is 5. GCS motor subscore is 6.     Cranial Nerves: Cranial nerves are intact. No cranial nerve deficit, dysarthria or facial asymmetry.     Sensory: Sensation is intact. No sensory deficit.     Motor: Motor function is intact. No weakness or pronator drift.     Coordination: Coordination is intact. Coordination normal.     ED Results / Procedures / Treatments   Labs (all labs ordered are listed, but only abnormal results are displayed) Labs Reviewed  COMPREHENSIVE METABOLIC PANEL - Abnormal; Notable for the following components:      Result Value   Glucose, Bld 118 (*)    All other components within normal limits  I-STAT CHEM 8, ED - Abnormal; Notable for the following components:   Glucose, Bld 112 (*)    All other components within normal limits   SARS CORONAVIRUS 2 BY RT PCR (HOSPITAL ORDER, [facility])  ETHANOL  PROTIME-INR  APTT  CBC  DIFFERENTIAL  RAPID URINE DRUG SCREEN, HOSP PERFORMED  URINALYSIS, ROUTINE W REFLEX MICROSCOPIC  LIPID PANEL  CBC  BASIC METABOLIC PANEL  HEMOGLOBIN A1C    EKG EKG Interpretation  Date/Time:  Friday January 19 2020 14:55:54 EDT Ventricular Rate:  69 PR Interval:    QRS Duration: 157 QT Interval:  447 QTC Calculation: 479 R Axis:   -60 Text Interpretation: Sinus rhythm Atrial premature complex RBBB and LAFB Confirmed by Gerlene Fee [phone]) on 01/19/2020 3:42:56 PM   Radiology MR CERVICAL SPINE WO CONTRAST  Result Date: 01/19/2020 CLINICAL DATA:  Cervical radiculopathy EXAM: MRI CERVICAL SPINE WITHOUT CONTRAST TECHNIQUE: Multiplanar, multisequence MR imaging of the cervical spine was performed. No intravenous contrast was administered. COMPARISON:  None. FINDINGS: Alignment: There is straightening of the normal cervical lordosis. Vertebrae: The vertebral body heights are well maintained. No fracture, marrow  edema,or pathologic marrow infiltration. Cord: Normal signal and morphology. Posterior Fossa, vertebral arteries, paraspinal tissues: The visualized portion of the posterior fossa is unremarkable. Normal flow voids seen within the vertebral arteries. The paraspinal soft tissues are unremarkable. Disc levels: C1-C2: Atlanto-axial junction is normal, without canal narrowing C2-C3: There is a minimal disc osteophyte complex which causes moderate left and mild right neural foraminal narrowing. C3-C4: There is a disc osteophyte complex and uncovertebral osteophytes which causes severe right and moderate left neural foraminal narrowing. C4-C5: There is a disc osteophyte complex and uncovertebral osteophytes which causes moderate right and severe left neural foraminal narrowing as well as mild central canal stenosis. C5-C6: Disc osteophyte complex and uncovertebral  osteophytes are present which causes moderate to severe left and moderate right neural foraminal narrowing. C6-C7: Cervical spine spondylosis with disc osteophyte complex and uncovertebral osteophytes are seen which causes severe bilateral neural foraminal narrowing. There is also a left lateral recess disc protrusion which contacts the exiting left C7 nerve root. C7-T1: No significant spinal canal or neural foraminal narrowing IMPRESSION: Straightening of the normal cervical lordosis. 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 Electronically Signed   By: Prudencio Pair M.D.   On: 01/19/2020 20:13    Procedures Procedures (including critical care time)  Medications Ordered in ED Medications  aspirin EC tablet 81 mg (has no administration in time range)  ezetimibe (ZETIA) tablet 10 mg (has no administration in time range)  isosorbide mononitrate (IMDUR) 24 hr tablet 60 mg (has no administration in time range)  metoprolol succinate (TOPROL-XL) 24 hr tablet 25 mg (has no administration in time range)  rosuvastatin (CRESTOR) tablet 40 mg (has no administration in time range)  pantoprazole (PROTONIX) EC tablet 40 mg (40 mg Oral Given 01/19/20 2049)  clopidogrel (PLAVIX) tablet 75 mg (has no administration in time range)   stroke: mapping our early stages of recovery book (has no administration in time range)  acetaminophen (TYLENOL) tablet 650 mg (has no administration in time range)    Or  acetaminophen (TYLENOL) 160 MG/5ML solution 650 mg (has no administration in time range)    Or  acetaminophen (TYLENOL) suppository 650 mg (has no administration in time range)  enoxaparin (LOVENOX) injection 40 mg (has no administration in time range)  insulin aspart (novoLOG) injection 0-9  Units (has no administration in time range)  aspirin tablet 325 mg (325 mg Oral Given 01/19/20 2049)  iohexol (OMNIPAQUE) 350 MG/ML injection 75 mL (75 mLs Intravenous Contrast Given 01/19/20 2013)    ED Course  I have reviewed the triage vital signs and the nursing notes.  Pertinent labs & imaging results that were available during my care of the patient were reviewed by me and considered in my medical decision making (see chart for details).    MDM Rules/Calculators/A&P                          74yo male with history of CAD, mild carotid artery stenosis, DM, htn, hlpd, presents with concern for episodes of numbness and weakness to right arm.  No symptoms at this time.  Neurology consulted with concern for TIA and will evaluate him.  Care signed out at [facility] with Neurology consult pending.   Final Clinical Impression(s) / ED Diagnoses  Final diagnoses:  TIA (transient ischemic attack)    Rx / DC Orders ED Discharge Orders    None       Gareth Morgan, MD 01/19/20 2125

## 2020-01-19 NOTE — Telephone Encounter (Signed)
Patient went to ED@ 1402

## 2020-01-19 NOTE — ED Triage Notes (Signed)
Pt to ED via EMS c/o intermittent  right arm weakness/numbess/tingling. Pt last known well 5am, Reports symptoms on and off since. . Currently denies . A&O X 4. HX CABG X 4. #18 LAC. No medications given by EMS. Last VS: 146/90 hr 70, RR18, 98%ra. CBG 148

## 2020-01-19 NOTE — Telephone Encounter (Signed)
Please follow-up with the patient to make sure he went to get evaluated. He needs to be seen in the ED for the complaint of weakness.

## 2020-01-19 NOTE — Telephone Encounter (Signed)
Left message for patient to return call back. Patient needs to be informed of what Dr Caryl Bis stated.

## 2020-01-19 NOTE — ED Notes (Signed)
Patient still in MRI at shift change; Patient belongings bags and labels placed on bags (3 belongings bags and 1 backpack)-Monique,RN

## 2020-01-19 NOTE — ED Notes (Signed)
Neurology at bedside.

## 2020-01-19 NOTE — H&P (Signed)
[facility] History and Physical  Kevin Webb XNA:[id number] DOB: 1945/08/29 DOA: 01/19/2020   PCP: McLean-Scocuzza, Nino Glow, MD  Specialists: Followed by Dr. [name] with cardiology  Chief Complaint: Right arm weakness  HPI: Kevin Webb is a 74 y.o. male with a past medical history of coronary artery disease, hyperlipidemia, diabetes mellitus type 2, essential hypertension who was in his usual state of health till this morning when he woke up around 6 AM and felt that his right arm had fallen asleep.  Apparently his blood pressure was 178/111 at that time.  He had not taken his blood pressure medication the night before.  He felt that his right arm was not working like it showed when he was trying to open his medication bottle.  However he was still able to use that arm.  He went to work.  He is a Development worker, community.  When he was also trying to attempt to use a wall phone he felt that he was unable to dial the numbers like he normal.  He subsequently called his primary care physician.  He was advised to come to the emergency department.  By the time he came to the ED his symptoms have resolved.  He also checked his blood glucose level at home and it was 146.  He denies any vision disturbances.  No weakness in his right leg.  No balance issues.  No falls or injuries recently.  No headaches.  No seizure type activity.  No fever chills recently.  No nausea vomiting or diarrhea.  He is on aspirin and Plavix for his heart disease and he has been compliant with these medications.  In the emergency department patient was evaluated and concern was for TIA.  He was seen by neurology who recommended further work-up.  He will be hospitalized for further management.    Home Medications: Prior to Admission medications   Medication Sig Start Date End Date Taking? Authorizing Provider  amLODipine (NORVASC) 5 MG tablet TAKE ONE (1) TABLET EACH DAY Patient taking differently: Take 5 mg by mouth daily at 2 am.   09/22/19  Yes McLean-Scocuzza, Nino Glow, MD  Ascorbic Acid (VITAMIN C) 1000 MG tablet Take 1,000 mg by mouth daily at 2 am. Reported on 09/26/2015   Yes [provider]  aspirin EC 81 MG tablet Take 81 mg by mouth daily at 2 am. Swallow whole.   Yes [provider]  cholecalciferol (VITAMIN D) 25 MCG (1000 UNIT) tablet Take 1,000 Units by mouth daily at 2 am.   Yes [provider]  clopidogrel (PLAVIX) 75 MG tablet Take 1 tablet (75 mg total) by mouth daily. Patient taking differently: Take 75 mg by mouth daily at 2 am.  09/22/19  Yes McLean-Scocuzza, Nino Glow, MD  Coenzyme Q10 (CO Q-10 PO) Take 1 capsule by mouth daily at 2 am.   Yes [provider]  ezetimibe (ZETIA) 10 MG tablet Take 1 tablet (10 mg total) by mouth daily. Patient taking differently: Take 10 mg by mouth daily at 2 am.  09/22/19  Yes McLean-Scocuzza, Nino Glow, MD  GINKGO BILOBA PO Take 1 capsule by mouth daily at 2 am.    Yes [provider]  isosorbide mononitrate (IMDUR) 60 MG 24 hr tablet Take 1 tablet (60 mg total) by mouth daily. 12/18/19  Yes Minus Breeding, MD  MAGNESIUM PO Take 1 tablet by mouth daily at 2 am.   Yes [provider]  metoprolol succinate (TOPROL XL) 25  MG 24 hr tablet Take 1 tablet (25 mg total) by mouth daily. Patient taking differently: Take 25 mg by mouth daily at 2 am.  09/22/19  Yes McLean-Scocuzza, Nino Glow, MD  Multiple Vitamin (MULTIVITAMIN WITH MINERALS) TABS tablet Take 1 tablet by mouth daily at 2 am.   Yes [provider]  naproxen sodium (ALEVE) 220 MG tablet Take 220 mg by mouth 2 (two) times daily as needed (back pain.).   Yes [provider]  niacin 250 MG tablet Take 250 mg by mouth daily.   Yes [provider]  nitroGLYCERIN (NITROSTAT) 0.4 MG SL tablet Place 1 tablet (0.4 mg total) under the tongue every 5 (five) minutes x 3 doses as needed. Patient taking differently: Place 0.4 mg under the tongue every 5 (five) minutes x  3 doses as needed for chest pain.  09/22/19  Yes McLean-Scocuzza, Nino Glow, MD  Nutritional Supplements (BOOST GLUCOSE CONTROL PO) Take 1 capsule by mouth daily.   Yes [provider]  Omega-3 Fatty Acids (FISH OIL) 1000 MG CAPS Take 2,000 mg by mouth daily at 2 am.    Yes [provider]  pantoprazole (PROTONIX) 40 MG tablet Take 1 tablet (40 mg total) by mouth 2 (two) times daily before a meal. 30 minutes. Try to reduce to 1x per day now or in 3 months Patient taking differently: Take 40 mg by mouth daily at 2 am. 30 minutes. Try to reduce to 1x per day now or in 3 months 09/22/19  Yes McLean-Scocuzza, Nino Glow, MD  ramipril (ALTACE) 10 MG capsule Take 1 capsule (10 mg total) by mouth daily. Patient taking differently: Take 10 mg by mouth daily at 2 am.  09/22/19  Yes McLean-Scocuzza, Nino Glow, MD  rosuvastatin (CRESTOR) 40 MG tablet Take 1 tablet (40 mg total) by mouth at bedtime. Patient taking differently: Take 40 mg by mouth daily at 2 am.  09/22/19  Yes McLean-Scocuzza, Nino Glow, MD  tamsulosin (FLOMAX) 0.4 MG CAPS capsule Take 0.4 mg by mouth daily at 2 am.    Yes [provider]  vitamin E 100 UNIT capsule Take 100 Units by mouth daily at 2 am.    Yes [provider]    Allergies:  Allergies  Allergen Reactions  . Darvocet [Propoxyphene N-Acetaminophen] Hives  . Propoxyphene Hives    Past Medical History: Past Medical History:  Diagnosis Date  . Acute cholecystitis 04/18/2013  . Adenomatous colon polyp 12/2004  . Anemia   . Blood transfusion without reported diagnosis   . BPH associated with nocturia   . CAD (coronary artery disease)    a. s/p CABG x4 in 2000 with LIMA-LAD, reverse SVG-IM, reverse SVG-acute margin and reverse SVG-RCA b. s/p BMS to SVG-RI in 2012 c. 12/2016: PCI/DES to SVG-RCA and PCI/DES to SVG-RI (Dr. [name])   . Carotid artery stenosis    mild   . Cataract   . Clotting disorder ([place])   . Diabetes mellitus without complication ([place])  63/05/6008   recent A1C 6.1 12/25/16 controlled with diet and exercise   . Diverticulitis 2008/2009  . GERD (gastroesophageal reflux disease)    controlled as of 04/01/17   . HSV infection   . HTN (hypertension)   . Hx of dysplastic nevus 05/04/2018   upper back spinal   . Hyperglycemia   . Hyperlipidemia   . Impingement syndrome of left shoulder 2016  . Low testosterone    Dr [name], [place] ,[place]  . Myocardial infarction (  [place])   . Osteoarthritis of right knee    With trace effusion  . Peyronie disease   . RLS (restless legs syndrome) 08/15/2013  . Sinus bradycardia   . Thrombocytopenia ([place])   . Vitamin D deficiency     Past Surgical History:  Procedure Laterality Date  . CHOLECYSTECTOMY N/A 04/21/2013   Procedure: LAPAROSCOPIC CHOLECYSTECTOMY WITH INTRAOPERATIVE CHOLANGIOGRAM;  Surgeon: Haywood Lasso, MD;  Location: [facility];  Service: General;  Laterality: N/A;  . CHOLECYSTECTOMY    . COLONOSCOPY W/ POLYPECTOMY  2004 & 2009    X 2; Dr Fuller Plan; due 2014  . CORONARY ANGIOPLASTY    . CORONARY ARTERY BYPASS GRAFT  04/1999   4 vessels  . CORONARY STENT INTERVENTION N/A 12/28/2016   Procedure: CORONARY STENT INTERVENTION;  Surgeon: Lorretta Harp, MD;  Location: [facility];  Service: Cardiovascular;  Laterality: N/A;  . CORONARY STENT PLACEMENT  2012   Plavix  . LAPAROSCOPIC CHOLECYSTECTOMY  04/21/2013   Dr Margot Chimes; acutecholecystitis with necrosis  . LEFT HEART CATH AND CORS/GRAFTS ANGIOGRAPHY N/A 12/28/2016   Procedure: LEFT HEART CATH AND CORS/GRAFTS ANGIOGRAPHY;  Surgeon: Lorretta Harp, MD;  Location: [facility];  Service: Cardiovascular;  Laterality: N/A;  . LEFT HEART CATH AND CORS/GRAFTS ANGIOGRAPHY N/A 12/01/2019   Procedure: LEFT HEART CATH AND CORS/GRAFTS ANGIOGRAPHY;  Surgeon: Martinique, Peter M, MD;  Location: [facility];  Service: Cardiovascular;  Laterality: N/A;  . VASECTOMY      Social History: Lives with his wife.  Does not smoke.  Has  at least 1-2 drinks daily but has cut down significantly over the last 39month on the advice of his cardiologist.  He is a Development worker, community by profession.  Usually very independent with daily activities.  Family History:  Family History  Problem Relation Age of Onset  . Aortic aneurysm Father 13       ? abdominal  . Heart attack Father 43  . Diabetes Other        PGaunt  . Coronary artery disease Mother        CABG in 36s  . CVA Mother        cns bleed from warfarin  . Aneurysm Mother   . Diabetes Brother   . Cholecystitis Sister   . Cancer Neg Hx   . Colon cancer Neg Hx   . Prostate cancer Neg Hx   . Esophageal cancer Neg Hx   . Stomach cancer Neg Hx   . Rectal cancer Neg Hx      Review of Systems - History obtained from the patient General ROS: negative Psychological ROS: negative Ophthalmic ROS: negative ENT ROS: negative Allergy and Immunology ROS: negative Hematological and Lymphatic ROS: negative Endocrine ROS: negative Respiratory ROS: no cough, shortness of breath, or wheezing Cardiovascular ROS: no chest pain or dyspnea on exertion Gastrointestinal ROS: Has diverticulosis and he thinks that he had a bout of diverticulitis recently for which he took some antibiotics which he had at home from a previous prescription.  Denies any symptoms currently. Genito-Urinary ROS: no dysuria, trouble voiding, or hematuria Musculoskeletal ROS: negative Neurological ROS: As in HPI Dermatological ROS: Follows with dermatology for multiple skin lesions.  Physical Examination  Vitals:   01/19/20 1600 01/19/20 1630 01/19/20 1645 01/19/20 1715  BP: 126/77 (!) 146/93 (!) 151/77 (!) 145/89  Pulse: 65 67 78 63  Resp: 20 (!) 21 (!) 24 13  Temp:      TempSrc:  SpO2: 98% 98% 98% 99%  Weight:      Height:        BP (!) 145/89   Pulse 63   Temp 98.5 F (36.9 C) (Oral)   Resp 13   Ht 5\' 9"  (1.753 m)   Wt 90.7 kg   SpO2 99%   BMI 29.53 kg/m   General appearance: alert,  cooperative, appears stated age and no distress Head: Normocephalic, without obvious abnormality, atraumatic Eyes: conjunctivae/corneas clear. PERRL, EOM's intact.  Throat: lips, mucosa, and tongue normal; teeth and gums normal Neck: no adenopathy, no carotid bruit, no JVD, supple, symmetrical, trachea midline and thyroid not enlarged, symmetric, no tenderness/mass/nodules Back: symmetric, no curvature. ROM normal. No CVA tenderness. Resp: clear to auscultation bilaterally Cardio: regular rate and rhythm, S1, S2 normal, no murmur, click, rub or gallop GI: soft, non-tender; bowel sounds normal; no masses,  no organomegaly Extremities: extremities normal, atraumatic, no cyanosis or edema Pulses: 2+ and symmetric Skin: Skin color, texture, turgor normal. No rashes or lesions Lymph nodes: Cervical, supraclavicular, and axillary nodes normal. Neurologic: Alert and oriented x3.  Cranial nerves II to XII intact.  Motor strength equal bilateral upper and lower extremities.  No pronator drift.   Labs on Admission: I have personally reviewed following labs and imaging studies  CBC: Recent Labs  Lab 01/19/20 1455 01/19/20 1538  WBC 5.8  --   NEUTROABS 4.5  --   HGB 14.1 13.6  HCT 42.2 40.0  MCV 94.4  --   PLT 162  --    Basic Metabolic Panel: Recent Labs  Lab 01/19/20 1455 01/19/20 1538  NA 137 140  K 4.0 3.9  CL 104 104  CO2 23  --   GLUCOSE 118* 112*  BUN 10 11  CREATININE 0.89 0.70  CALCIUM 9.1  --    GFR: Estimated Creatinine Clearance: 92.9 mL/min (by C-G formula based on SCr of 0.7 mg/dL). Liver Function Tests: Recent Labs  Lab 01/19/20 1455  AST 33  ALT 25  ALKPHOS 39  BILITOT 0.8  PROT 6.5  ALBUMIN 3.9   Coagulation Profile: Recent Labs  Lab 01/19/20 1455  INR 1.1     Radiological Exams on Admission: No results found.  My interpretation of Electrocardiogram: Sinus rhythm in the 60s with RBBB.  Similar to previous EKG.   Problem List  Principal  Problem:   TIA (transient ischemic attack) Active Problems:   Type 2 diabetes mellitus without complication, without long-term current use of insulin (HCC)   Coronary artery disease involving native coronary artery of native heart without angina pectoris   Assessment: This is a 74 year old Caucasian male with past medical history as stated earlier who comes in with a right arm weakness which lasted for several hours.  Currently symptom-free.  Concern is for TIA.  Plan:  1. TIA: Stroke needs to be ruled out.  Patient seen by neurology.  MRI brain has been ordered.  CTA head and neck will be ordered.  Echocardiogram.  Lipid panel.  Continue with aspirin and Plavix.  He is on Crestor which will be continued.  PT OT evaluation.  Swallow screen.  2.  History of coronary artery disease: Underwent cardiac catheterization recently in July.  He has a known history of CABG.  He was noted to have multiple occlusions in his grafts. Was not a candidate for interventions.  Medical management is being pursued.  Continue with his home medications including beta-blockers nitrates as well as aspirin and Plavix.  3.  Diabetes mellitus type 2: Apparently not started on any medications yet.  He supposed to see his PCP to consider Metformin.  Glucose level noted to be 118.  HbA1c 7.0 in June.  We will recheck it tomorrow.  SSI.  4.  History of alcohol use: Used to drink 3-4 drinks daily but since last month he has cut down significantly.  At low risk for withdrawal but continue to monitor closely.  5.  Essential hypertension: Permissive hypertension to be allowed.  Monitor blood pressures closely.   DVT Prophylaxis: Lovenox Code Status: Full code Family Communication: Discussed with the patient Disposition: Hopefully return home when evaluation has been completed Consults called: Neurology Admission Status: Status is: Observation  The patient remains OBS appropriate and will d/c before 2 midnights.  Dispo:  The patient is from: Home              Anticipated d/c is to: Home              Anticipated d/c date is: 1 day              Patient currently is not medically stable to d/c.   Covid test has been ordered and is pending.  He is asymptomatic.   Severity of Illness: The appropriate patient status for this patient is OBSERVATION. Observation status is judged to be reasonable and necessary in order to provide the required intensity of service to ensure the patient's safety. The patient's presenting symptoms, physical exam findings, and initial radiographic and laboratory data in the context of their medical condition is felt to place them at decreased risk for further clinical deterioration. Furthermore, it is anticipated that the patient will be medically stable for discharge from the hospital within 2 midnights of admission. The following factors support the patient status of observation.   " The patient's presenting symptoms include right arm weakness. " The physical exam findings include unremarkable neurological exam. " The initial radiographic and laboratory data are unremarkable so far.   Further management decisions will depend on results of further testing and patient's response to treatment.   Adan Baehr [employer]  [facility] operational officer on [email or web address]  01/19/2020, 5:57 PM

## 2020-01-19 NOTE — Consult Note (Signed)
Neurology Consultation  Reason for Consult: Right arm numbness Referring Physician: Dr. Sedonia Small  CC: Transient right arm numbness which has resolved  History is obtained from: Patient  HPI: Kevin Webb is a 74 y.o. male with past medical history of thrombocytopenia, myocardial infarct, hyperlipidemia, hyperglycemia, hypertension, diabetes.  Patient states that this morning he awoke at 430 and then went to bed at 5:00.  At that time he felt normal when he woke up again at 608 he felt as though his right arm had fallen asleep.  He states he did not take his blood pressure medication the night before thus upon waking he did take his blood pressure which was 178/111.  He did note that when he was taking his blood pressure medication in the morning that when he was trying to unscrew the cap with his right hand his hand did not seem to be working like it should.  He then went on to get cleaned up and went to his truck to go to work.  At that time he felt as though his hand numbness had improved but not fully back to normal.  At 930 he was at work and noted that his arm felt weak.  He went to dial a phone and although he could dial the numbers without a problem he just did not feel right.  He states at 11:00 his arm had felt fine but he was dizzy at that time.  Due to this he went to his house to see if his blood sugar was normal and it was at 146.  He called his PCP who told him to go to the ED due to his symptoms.  At this time his symptoms have fully resolved.  He states he has never had symptoms like this before other than last week waking up when his arm was numb but that lasted only for a second and he has been sleeping on it.  Patient states he takes both aspirin and Plavix, does not smoke and does not take any stimulants.  He does drink but only approximately 2 beers and not that often.   LKW: 5 AM on 01/19/2020 tpa given?: no, symptoms resolved Premorbid modified Rankin scale (mRS): 0 NIHSS TOTAL:  0 *  Past Medical History:  Diagnosis Date  . Acute cholecystitis 04/18/2013  . Adenomatous colon polyp 12/2004  . Anemia   . Blood transfusion without reported diagnosis   . BPH associated with nocturia   . CAD (coronary artery disease)    a. s/p CABG x4 in 2000 with LIMA-LAD, reverse SVG-IM, reverse SVG-acute margin and reverse SVG-RCA b. s/p BMS to SVG-RI in 2012 c. 12/2016: PCI/DES to SVG-RCA and PCI/DES to SVG-RI (Dr. [name])   . Carotid artery stenosis    mild   . Cataract   . Clotting disorder ([place])   . Diabetes mellitus without complication ([place]) 97/67/3419   recent A1C 6.1 12/25/16 controlled with diet and exercise   . Diverticulitis 2008/2009  . GERD (gastroesophageal reflux disease)    controlled as of 04/01/17   . HSV infection   . HTN (hypertension)   . Hx of dysplastic nevus 05/04/2018   upper back spinal   . Hyperglycemia   . Hyperlipidemia   . Impingement syndrome of left shoulder 2016  . Low testosterone    Dr [name], [place] ,[place]  . Myocardial infarction ([place])   . Osteoarthritis of right knee    With trace effusion  . Peyronie disease   .  RLS (restless legs syndrome) 08/15/2013  . Sinus bradycardia   . Thrombocytopenia ([place])   . Vitamin D deficiency      Family History  Problem Relation Age of Onset  . Aortic aneurysm Father 44       ? abdominal  . Heart attack Father 42  . Diabetes Other        PGaunt  . Coronary artery disease Mother        CABG in 94s  . CVA Mother        cns bleed from warfarin  . Aneurysm Mother   . Diabetes Brother   . Cholecystitis Sister   . Cancer Neg Hx   . Colon cancer Neg Hx   . Prostate cancer Neg Hx   . Esophageal cancer Neg Hx   . Stomach cancer Neg Hx   . Rectal cancer Neg Hx    Social History:   reports that he has never smoked. He has never used smokeless tobacco. He reports current alcohol use of about 10.0 - 12.0 standard drinks of alcohol per week. He reports that he does not use  drugs.  Medications  Current Facility-Administered Medications:  .  sodium chloride flush (NS) 0.9 % injection 3 mL, 3 mL, Intravenous, Q12H, Almyra Deforest, PA  Current Outpatient Medications:  .  amLODipine (NORVASC) 5 MG tablet, TAKE ONE (1) TABLET EACH DAY (Patient taking differently: Take 5 mg by mouth daily at 2 am. ), Disp: 90 tablet, Rfl: 3 .  Ascorbic Acid (VITAMIN C) 1000 MG tablet, Take 1,000 mg by mouth daily at 2 am. Reported on 09/26/2015, Disp: , Rfl:  .  aspirin EC 81 MG tablet, Take 81 mg by mouth daily at 2 am. Swallow whole., Disp: , Rfl:  .  cholecalciferol (VITAMIN D) 25 MCG (1000 UNIT) tablet, Take 1,000 Units by mouth daily at 2 am., Disp: , Rfl:  .  clopidogrel (PLAVIX) 75 MG tablet, Take 1 tablet (75 mg total) by mouth daily. (Patient taking differently: Take 75 mg by mouth daily at 2 am. ), Disp: 90 tablet, Rfl: 3 .  Coenzyme Q10 (CO Q-10 PO), Take 1 capsule by mouth daily at 2 am., Disp: , Rfl:  .  ezetimibe (ZETIA) 10 MG tablet, Take 1 tablet (10 mg total) by mouth daily. (Patient taking differently: Take 10 mg by mouth daily at 2 am. ), Disp: 90 tablet, Rfl: 3 .  GINKGO BILOBA PO, Take 1 capsule by mouth daily at 2 am. , Disp: , Rfl:  .  isosorbide mononitrate (IMDUR) 60 MG 24 hr tablet, Take 1 tablet (60 mg total) by mouth daily., Disp: 90 tablet, Rfl: 3 .  MAGNESIUM PO, Take 1 tablet by mouth daily at 2 am., Disp: , Rfl:  .  metoprolol succinate (TOPROL XL) 25 MG 24 hr tablet, Take 1 tablet (25 mg total) by mouth daily. (Patient taking differently: Take 25 mg by mouth daily at 2 am. ), Disp: 90 tablet, Rfl: 3 .  Multiple Vitamin (MULTIVITAMIN WITH MINERALS) TABS tablet, Take 1 tablet by mouth daily at 2 am., Disp: , Rfl:  .  naproxen sodium (ALEVE) 220 MG tablet, Take 220 mg by mouth 2 (two) times daily as needed (back pain.)., Disp: , Rfl:  .  niacin 250 MG tablet, Take 250 mg by mouth daily., Disp: , Rfl:  .  nitroGLYCERIN (NITROSTAT) 0.4 MG SL tablet, Place 1 tablet  (0.4 mg total) under the tongue every 5 (five) minutes x 3 doses  as needed. (Patient taking differently: Place 0.4 mg under the tongue every 5 (five) minutes x 3 doses as needed for chest pain. ), Disp: 25 tablet, Rfl: 4 .  Nutritional Supplements (BOOST GLUCOSE CONTROL PO), Take 1 capsule by mouth daily., Disp: , Rfl:  .  Omega-3 Fatty Acids (FISH OIL) 1000 MG CAPS, Take 2,000 mg by mouth daily at 2 am. , Disp: , Rfl:  .  pantoprazole (PROTONIX) 40 MG tablet, Take 1 tablet (40 mg total) by mouth 2 (two) times daily before a meal. 30 minutes. Try to reduce to 1x per day now or in 3 months (Patient taking differently: Take 40 mg by mouth daily at 2 am. 30 minutes. Try to reduce to 1x per day now or in 3 months), Disp: 180 tablet, Rfl: 3 .  ramipril (ALTACE) 10 MG capsule, Take 1 capsule (10 mg total) by mouth daily. (Patient taking differently: Take 10 mg by mouth daily at 2 am. ), Disp: 90 capsule, Rfl: 3 .  rosuvastatin (CRESTOR) 40 MG tablet, Take 1 tablet (40 mg total) by mouth at bedtime. (Patient taking differently: Take 40 mg by mouth daily at 2 am. ), Disp: 90 tablet, Rfl: 3 .  tamsulosin (FLOMAX) 0.4 MG CAPS capsule, Take 0.4 mg by mouth daily at 2 am. , Disp: , Rfl:  .  vitamin E 100 UNIT capsule, Take 100 Units by mouth daily at 2 am. , Disp: , Rfl:   ROS:     General ROS: negative for - chills, fatigue, fever, night sweats, weight gain or weight loss Psychological ROS: negative for - behavioral disorder, hallucinations, memory difficulties, mood swings or suicidal ideation Ophthalmic ROS: negative for - blurry vision, double vision, eye pain or loss of vision ENT ROS: negative for - epistaxis, nasal discharge, oral lesions, sore throat, tinnitus or vertigo Allergy and Immunology ROS: negative for - hives or itchy/watery eyes Hematological and Lymphatic ROS: negative for - bleeding problems, bruising or swollen lymph nodes Endocrine ROS: negative for - galactorrhea, hair pattern changes,  polydipsia/polyuria or temperature intolerance Respiratory ROS: negative for - cough, hemoptysis, shortness of breath or wheezing Cardiovascular ROS: negative for - chest pain, dyspnea on exertion, edema or irregular heartbeat Gastrointestinal ROS: negative for - abdominal pain, diarrhea, hematemesis, nausea/vomiting or stool incontinence Genito-Urinary ROS: negative for - dysuria, hematuria, incontinence or urinary frequency/urgency Musculoskeletal ROS: Positive for -  muscular weakness Neurological ROS: as noted in HPI Dermatological ROS: negative for rash and skin lesion changes  Exam: Current vital signs: BP (!) 146/93   Pulse 67   Temp 98.5 F (36.9 C) (Oral)   Resp (!) 21   Ht 5\' 9"  (1.753 m)   Wt 90.7 kg   SpO2 98%   BMI 29.53 kg/m  Vital signs in last 24 hours: Temp:  [98.5 F (36.9 C)] 98.5 F (36.9 C) (09/03 1405) Pulse Rate:  [65-83] 67 (09/03 1630) Resp:  [15-22] 21 (09/03 1630) BP: (115-146)/(71-93) 146/93 (09/03 1630) SpO2:  [96 %-100 %] 98 % (09/03 1630) Weight:  [90.7 kg] 90.7 kg (09/03 1410)   Constitutional: Appears well-developed and well-nourished.  Psych: Affect appropriate to situation Eyes: No scleral injection HENT: No OP obstrucion Head: Normocephalic.  Cardiovascular: Normal rate and regular rhythm.  Respiratory: Effort normal, non-labored breathing GI: Soft.  No distension. There is no tenderness.  Skin: WDI  Neuro: Mental Status: Patient is awake, alert, oriented to person, place, month, year, and situation.  Patient has no dysarthria or aphasia.  Naming, repeating, comprehension are grossly intact.  Patient is able to follow all commands asked of him.  Patient is able to give a good history.  Cranial Nerves: II: Visual Fields are full.  III,IV, VI: EOMI without ptosis or diploplia. Pupils equal, round and reactive to light V: Facial sensation is symmetric to temperature VII: Facial movement is symmetric.  VIII: hearing is intact to  voice X: Palat elevates symmetrically XI: Shoulder shrug is symmetric. XII: tongue is midline without atrophy or fasciculations.  Motor: Tone is normal. Bulk is normal. 5/5 strength was present in all four extremities.  Sensory: Sensation is symmetric to light touch and temperature in the arms and legs. DSS intact Deep Tendon Reflexes: 2+ and symmetric in the biceps and patellae.  Plantars: Toes are downgoing bilaterally.  Cerebellar: FNF and HKS are intact bilaterally  Labs I have reviewed labs in epic and the results pertinent to this consultation are:   CBC    Component Value Date/Time   WBC 5.8 01/19/2020 1455   RBC 4.47 01/19/2020 1455   HGB 13.6 01/19/2020 1538   HGB 14.5 11/28/2019 1049   HCT 40.0 01/19/2020 1538   HCT 42.3 11/28/2019 1049   PLT 162 01/19/2020 1455   PLT 163 11/28/2019 1049   MCV 94.4 01/19/2020 1455   MCV 91 11/28/2019 1049   MCV 92 06/12/2012 0540   MCH 31.5 01/19/2020 1455   MCHC 33.4 01/19/2020 1455   RDW 12.6 01/19/2020 1455   RDW 13.7 11/28/2019 1049   RDW 13.3 06/12/2012 0540   LYMPHSABS 0.9 01/19/2020 1455   MONOABS 0.4 01/19/2020 1455   EOSABS 0.0 01/19/2020 1455   BASOSABS 0.0 01/19/2020 1455    CMP     Component Value Date/Time   NA 140 01/19/2020 1538   NA 138 11/28/2019 1049   NA 139 06/12/2012 0540   K 3.9 01/19/2020 1538   K 3.8 06/12/2012 0540   CL 104 01/19/2020 1538   CL 108 (H) 06/12/2012 0540   CO2 23 01/19/2020 1455   CO2 21 06/12/2012 0540   GLUCOSE 112 (H) 01/19/2020 1538   GLUCOSE 128 (H) 06/12/2012 0540   BUN 11 01/19/2020 1538   BUN 10 11/28/2019 1049   BUN 12 06/12/2012 0540   CREATININE 0.70 01/19/2020 1538   CREATININE 0.91 09/27/2015 1414   CALCIUM 9.1 01/19/2020 1455   CALCIUM 8.4 (L) 06/12/2012 0540   PROT 6.5 01/19/2020 1455   PROT 6.8 06/12/2012 0540   ALBUMIN 3.9 01/19/2020 1455   ALBUMIN 3.8 06/12/2012 0540   AST 33 01/19/2020 1455   AST 24 06/12/2012 0540   ALT 25 01/19/2020 1455    ALT 25 06/12/2012 0540   ALKPHOS 39 01/19/2020 1455   ALKPHOS 76 06/12/2012 0540   BILITOT 0.8 01/19/2020 1455   BILITOT 0.4 06/12/2012 0540   GFRNONAA >60 01/19/2020 1455   GFRNONAA >60 06/12/2012 0540   GFRAA >60 01/19/2020 1455   GFRAA >60 06/12/2012 0540    Lipid Panel     Component Value Date/Time   CHOL 150 10/24/2019 0912   TRIG 224.0 (H) 10/24/2019 0912   HDL 43.70 10/24/2019 0912   CHOLHDL 3 10/24/2019 0912   VLDL 44.8 (H) 10/24/2019 0912   LDLCALC 56 07/18/2019 0956   LDLDIRECT 74.0 10/24/2019 0912     Etta Quill PA-C [facility] [phone]  M-F  (9:00 am- 5:00 PM)  01/19/2020, 4:48 PM     Assessment:   This is a 74 year old male with multiple  risk factors for stroke.  Patient experienced right arm heaviness and numbness which lasted for approximately 5 hours and at this point is fully resolved.  There was no point time in which it fully resolved and then started up again.  In addition he also described feeling dizzy.  On exam at this point time he is fully resolved.  Given the fact that this is a 1 prolonged period of time event I do believe he may have suffered a TIA/stroke.  I do not believe that this was a seizure event as it was not a stuttering episode.   Impression: -TIA versus stroke  Recommend -MRI of the brain without contrast -CTA head neck -Transthoracic Echo,   -Continue aspirin and Plavix -Start or continue Atorvastatin 80 mg/other high intensity statin -BP goal: permissive HTN upto 220/120 mmHg -HBAIC and Lipid profile -Telemetry monitoring -Frequent neuro checks -NPO until passes stroke swallow screen -PT/OT # please page stroke NP  Or  PA  Or MD from 8am -4 pm  as this patient from this time will be  followed by the stroke.   You can look them up on [email or web address]  Password [email or web address]

## 2020-01-19 NOTE — ED Notes (Signed)
Pt transported to MRI 

## 2020-01-19 NOTE — ED Provider Notes (Signed)
  Provider Note MRN:  [id number]  Arrival date & time: 01/19/20    ED Course and Medical Decision Making  Assumed care from Dr. Billy Fischer at shift change.  Concern for TIA versus stroke, neurology consulted and they are recommending MRI, admission.  Will admit to unassigned internal medicine service.  Procedures  Final Clinical Impressions(s) / ED Diagnoses     ICD-10-CM   1. TIA (transient ischemic attack)  G45.9     ED Discharge Orders    None      Discharge Instructions   None     Barth Kirks. Sedonia Small, [place] [email or web address]    Maudie Flakes, MD 01/19/20 [phone]

## 2020-01-19 NOTE — Telephone Encounter (Addendum)
Access Nurse   Pt called he thinks that he is having a stoke  Weakness and numbness in his left arm and hand started this morning when he woke up  I Advised pt go to the ED if he thinks he is having a stroke. He said that he has never had a stroke so he doesn't know what one feels like and he wanted to speak with a Nurse or Dr.Tracy before he goes.  Advised that Dr.Tracy is out of the office and transferred to Access Nurse

## 2020-01-19 NOTE — Telephone Encounter (Signed)
--Caller states he has weakness in his right arm since he woke up at 6 this morning. No numbness or tingling. Speech is not slurred, states his smile is even. States BP this morning was 178/111, checked again at 8am and it was 128/72.         [place] [facility] [place] RECORD [facility] Patient Name: Kevin Webb Gender: Male DOB: 08-17-1945 Age: 74 Y 1 M 7 D Return Phone Number: [phone] (Primary), [phone] (Secondary) Address: City/State/Zip: [place] [address] Client [facility] [place] [facility] [place] Client Site [place] - Day Physician McClean-Scocuzza, Olivia Mackie Contact Type Call Who Is Calling Patient / Member / Family / Caregiver Call Type Triage / Clinical Relationship To Patient Self Return Phone Number [phone] (Secondary) Chief Complaint NUMBNESS/TINGLING- sudden on one side of the body or face Reason for Call Symptomatic / Request for Health Information Initial Comment Has weakness and numbness in right arm and hand, thinks he is having a stroke. Wont go to the ER till he talks to a nurse. Had to use left hand this morning to move his right arm. Translation No Nurse Assessment Nurse: Claiborne Billings, RN, Kim Date/Time (Eastern Time): 01/19/2020 9:06:51 AM Confirm and document reason for call. If symptomatic, describe symptoms. ---Caller states he has weakness in his right arm since he woke up at 6 this morning. No numbness or tingling. Speech is not slurred, states his smile is even. States BP this morning was 178/111, checked again at 8am and it was 128/72. Has the patient had close contact with a person known or suspected to have the novel coronavirus illness OR traveled / lives in area with major community spread (including international travel) in the last 14 days from the onset of symptoms? * If Asymptomatic, screen for exposure and travel within the last 14  days. ---No Does the patient have any new or worsening symptoms? ---Yes Will a triage be completed? ---Yes Related visit to physician within the last 2 weeks? ---Yes Does the PT have any chronic conditions? (i.e. diabetes, asthma, this includes High risk factors for pregnancy, etc.) ---Yes List chronic conditions. ---HTN, Hx bypass surgery, Heart disease Is this a behavioral health or substance abuse call? ---No Guidelines Guideline Title Affirmed Question Affirmed Notes Nurse Date/Time Eilene Ghazi Time) Neurologic Deficit [1] Weakness (i.e., paralysis, loss of muscle Claiborne Billings, RN, Maudie Mercury 01/19/2020 9:11:50 AM PLEASE NOTE: All timestamps contained within this report are represented as [place] Standard Time. CONFIDENTIALTY NOTICE: This fax transmission is intended only for the addressee. It contains information that is legally privileged, confidential or otherwise protected from use or disclosure. If you are not the intended recipient, you are strictly prohibited from reviewing, disclosing, copying using or disseminating any of this information or taking any action in reliance on or regarding this information. If you have received this fax in error, please notify us immediately by telephone so that we can arrange for its return to [place]. Phone: [phone], Toll-Free: [phone], Fax: [phone] Page: 2 of 2 Call Id: [id number] Guidelines Guideline Title Affirmed Question Affirmed Notes Nurse Date/Time Eilene Ghazi Time) strength) of the face, arm / hand, or leg / foot on one side of the body AND [2] sudden onset AND [3] present now (Exception: Bell's palsy suspected [i.e., weakness only one side of the face, developing over hours to days, no other symptoms]) Disp. Time Eilene Ghazi Time) Disposition Final User 01/19/2020 9:05:37 AM Send to Urgent [place] 01/19/2020 9:19:29 AM  [place], RN, Kim Reason: Caller states he is not calling EMS 911, states the station  (rescue squad) is just down the road from him and he is going to drive there 2/0/8022 9:14:11 AM Call EMS 911 Now Yes Claiborne Billings, RN, Max Sane Disagree/Comply Comply Caller Understands Yes PreDisposition InappropriateToAsk Care Advice Given Per Guideline CALL EMS 911 NOW: * Immediate medical attention is needed. You need to hang up and call 911 (or an ambulance). * [facility] Discretion: I'll call you back in a few minutes to be sure you were able to reach them. CARE ADVICE given per Neurologic Deficit (Adult) guideline.

## 2020-01-20 ENCOUNTER — Encounter (HOSPITAL_COMMUNITY): Payer: Self-pay | Admitting: Internal Medicine

## 2020-01-20 ENCOUNTER — Other Ambulatory Visit: Payer: Self-pay

## 2020-01-20 DIAGNOSIS — I2510 Atherosclerotic heart disease of native coronary artery without angina pectoris: Secondary | ICD-10-CM | POA: Diagnosis not present

## 2020-01-20 DIAGNOSIS — G459 Transient cerebral ischemic attack, unspecified: Secondary | ICD-10-CM | POA: Diagnosis not present

## 2020-01-20 DIAGNOSIS — E119 Type 2 diabetes mellitus without complications: Secondary | ICD-10-CM | POA: Diagnosis not present

## 2020-01-20 DIAGNOSIS — M542 Cervicalgia: Secondary | ICD-10-CM | POA: Diagnosis not present

## 2020-01-20 LAB — HEMOGLOBIN A1C
Hgb A1c MFr Bld: 5.9 % — ABNORMAL HIGH (ref 4.8–5.6)
Mean Plasma Glucose: 122.63 mg/dL

## 2020-01-20 LAB — BASIC METABOLIC PANEL
Anion gap: 9 (ref 5–15)
BUN: 8 mg/dL (ref 8–23)
CO2: 24 mmol/L (ref 22–32)
Calcium: 8.9 mg/dL (ref 8.9–10.3)
Chloride: 105 mmol/L (ref 98–111)
Creatinine, Ser: 0.86 mg/dL (ref 0.61–1.24)
GFR calc Af Amer: >60 mL/min (ref >60)
GFR calc non Af Amer: >60 mL/min (ref >60)
Glucose, Bld: 108 mg/dL — ABNORMAL HIGH (ref 70–99)
Potassium: 3.5 mmol/L (ref 3.5–5.1)
Sodium: 138 mmol/L (ref 135–145)

## 2020-01-20 LAB — CBC
HCT: 40.3 % (ref 39.0–52.0)
Hemoglobin: 13.4 g/dL (ref 13.0–17.0)
MCH: 31 pg (ref 26.0–34.0)
MCHC: 33.3 g/dL (ref 30.0–36.0)
MCV: 93.3 fL (ref 80.0–100.0)
Platelets: 152 10*3/uL (ref 150–400)
RBC: 4.32 MIL/uL (ref 4.22–5.81)
RDW: 12.7 % (ref 11.5–15.5)
WBC: 4.8 10*3/uL (ref 4.0–10.5)
nRBC: 0 % (ref 0.0–0.2)

## 2020-01-20 LAB — LIPID PANEL
Cholesterol: 117 mg/dL (ref 0–200)
HDL: 37 mg/dL — ABNORMAL LOW (ref >40)
LDL Cholesterol: 51 mg/dL (ref 0–99)
Total CHOL/HDL Ratio: 3.2 RATIO
Triglycerides: 147 mg/dL (ref <150)
VLDL: 29 mg/dL (ref 0–40)

## 2020-01-20 LAB — CBG MONITORING, ED: Glucose-Capillary: 94 mg/dL (ref 70–99)

## 2020-01-20 MED ORDER — ASPIRIN 81 MG PO CHEW
81.0000 mg | CHEWABLE_TABLET | Freq: Every day | ORAL | Status: DC
  Administered 2020-01-20: 81 mg via ORAL
  Filled 2020-01-20: qty 1

## 2020-01-20 NOTE — ED Notes (Signed)
RN attempted to call report x1 will try again-Monique,RN

## 2020-01-20 NOTE — Progress Notes (Signed)
Brief Neuro Update:  Reviewed CTA Head and Neck, MRI  B and C spine. Notable findings include: 1. 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  2. 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 3. 60% proximal left ICA stenosis with severe left greater than right vertebral artery origin stenoses.  Rest of the stroke/TIA workup: He has had a recent cardiac cath on 7/16/21with an EF of 55-60%. I don't think a surface Echo will add much right now so I cancelled it. LDL is < 70and he seems to take rosuvastatin at home. HbA1c is mildly elevated.  He still had persistent R shoulder abduction weakness and decreased to pinprick over the R lateral shoulder on my exam yesterday. I think these are likely originating from cervical radiculopathy. My suspicion for CVA is low.  Recs: - Physical therapy - Recommend EMG outpatient asa confirmatory testing for the C spine findings.(axillary nerve injury can also cause similar presentation) - outpatient follow up with Neurosurgery for C spine findings - Neurology inpatient team will signoff. Please feel free to contact us with any questions or concerns.   [place] Pager Number [phone]

## 2020-01-20 NOTE — Progress Notes (Addendum)
Physician Discharge Summary  Kevin Webb IHK:[id number] DOB: 1945-06-19 DOA: 01/19/2020  PCP: McLean-Scocuzza, Nino Glow, MD  Admit date: 01/19/2020 Discharge date: 01/20/2020  Time spent: 50 minutes  Recommendations for Outpatient Follow-up:  1. Follow-up neurosurgery in 2 weeks 2. Follow-up neurology in 2 weeks for EMG as outpatient  Discharge Diagnoses:  Principal Problem:   TIA (transient ischemic attack) Active Problems:   Type 2 diabetes mellitus without complication, without long-term current use of insulin (HCC)   Coronary artery disease involving native coronary artery of native heart without angina pectoris   Discharge Condition: Stable  Diet recommendation: Heart healthy diet  Filed Weights   01/19/20 1410 01/20/20 0327  Weight: 90.7 kg 90.9 kg    History of present illness:  74 year old male with medical history of CAD, hyperlipidemia, diabetes mellitus type 2, essential hypertension who came to hospital with right arm falling asleep.  Blood pressure was elevated 178/111.  No vision disturbance, no weakness in right leg.  No balance issues.  Patient symptom had resolved by the time he came to ED.  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  No acute infarct noted.  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  60% proximal left ICA stenosis with severe left greater than right vertebral artery origin stenosis. Neurology recommend outpatient EMG to confirm C-spine findings. Neurology discontinued patient's echocardiogram as patient had cardiac cath on 12/01/2019 with EF 55 to 60%.  As per neurology patient's findings were more likely from cervical radiculopathy.  Suspicion for  CVA is low. Outpatient neurosurgical evaluation for C-spine findings. I called and discussed with neurosurgery, Dr. Arnoldo Morale and patient has been given appointment to see him in 2 weeks. Patient will follow up with [facility] in 2 weeks for outpatient EMG.  Procedures:    Consultations:  Neurology  Discharge Exam: Vitals:   01/20/20 0327 01/20/20 0800  BP: (!) 152/72 135/63  Pulse: (!) 51 (!) 49  Resp: 18 20  Temp: 97.9 F (36.6 C) 97.8 F (36.6 C)  SpO2: 97% 94%    General: Appears in no acute distress Cardiovascular: S1-S2, regular, no murmur auscultated Respiratory: Clear to auscultation bilaterally  Discharge Instructions   Discharge Instructions    Ambulatory referral to Neurology   Complete by: As directed    An appointment is requested in approximately: 2 weeks for outpatient EMG   Diet - low sodium heart healthy   Complete by: As directed    Increase activity slowly   Complete by: As directed      Allergies as of 01/20/2020      Reactions   Darvocet [propoxyphene N-acetaminophen] Hives   Propoxyphene Hives      Medication List    TAKE these medications   amLODipine 5 MG tablet Commonly known as: NORVASC TAKE ONE (1) TABLET EACH DAY What changed:   how much to take  how to take this  when to take this  additional instructions   aspirin EC 81 MG tablet Take 81 mg by mouth daily at 2 am. Swallow whole.   BOOST GLUCOSE CONTROL PO Take 1 capsule by mouth daily.   cholecalciferol 25 MCG (1000 UNIT) tablet Commonly known as: VITAMIN D Take 1,000 Units by mouth daily at  2 am.   clopidogrel 75 MG tablet Commonly known as: PLAVIX Take 1 tablet (75 mg total) by mouth daily. What changed: when to take this   CO Q-10 PO Take 1 capsule by mouth daily at 2 am.   ezetimibe 10 MG tablet Commonly known as: Zetia Take 1 tablet (10 mg total) by mouth daily. What changed: when to take this   Fish Oil 1000 MG Caps Take 2,000  mg by mouth daily at 2 am.   GINKGO BILOBA PO Take 1 capsule by mouth daily at 2 am.   isosorbide mononitrate 60 MG 24 hr tablet Commonly known as: IMDUR Take 1 tablet (60 mg total) by mouth daily.   MAGNESIUM PO Take 1 tablet by mouth daily at 2 am.   metoprolol succinate 25 MG 24 hr tablet Commonly known as: Toprol XL Take 1 tablet (25 mg total) by mouth daily. What changed: when to take this   multivitamin with minerals Tabs tablet Take 1 tablet by mouth daily at 2 am.   naproxen sodium 220 MG tablet Commonly known as: ALEVE Take 220 mg by mouth 2 (two) times daily as needed (back pain.).   niacin 250 MG tablet Take 250 mg by mouth daily.   nitroGLYCERIN 0.4 MG SL tablet Commonly known as: NITROSTAT Place 1 tablet (0.4 mg total) under the tongue every 5 (five) minutes x 3 doses as needed. What changed: reasons to take this   pantoprazole 40 MG tablet Commonly known as: PROTONIX Take 1 tablet (40 mg total) by mouth 2 (two) times daily before a meal. 30 minutes. Try to reduce to 1x per day now or in 3 months What changed: when to take this   ramipril 10 MG capsule Commonly known as: ALTACE Take 1 capsule (10 mg total) by mouth daily. What changed: when to take this   rosuvastatin 40 MG tablet Commonly known as: CRESTOR Take 1 tablet (40 mg total) by mouth at bedtime. What changed: when to take this   tamsulosin 0.4 MG Caps capsule Commonly known as: FLOMAX Take 0.4 mg by mouth daily at 2 am.   vitamin C 1000 MG tablet Take 1,000 mg by mouth daily at 2 am. Reported on 09/26/2015   vitamin E 45 MG (100 UNITS) capsule Take 100 Units by mouth daily at 2 am.      Allergies  Allergen Reactions  . Darvocet [Propoxyphene N-Acetaminophen] Hives  . Propoxyphene Hives    Follow-up Information    Newman Pies, MD. Schedule an appointment as soon as possible for a visit.   Specialty: Neurosurgery Why: Sharyn Lull is Dr. Cam Hai new patient coordinator. She  will help get you set up as a new patient with Dr. Arnoldo Morale. Contact information: [address] [place] [address] [phone]        [facility]. Schedule an appointment as soon as possible for a visit in 2 week(s).   Specialty: Neurology Why: For EMG  Contact information: [address] [place] [phone]               The results of significant diagnostics from this hospitalization (including imaging, microbiology, ancillary and laboratory) are listed below for reference.    Significant Diagnostic Studies: CT ANGIO HEAD W OR WO CONTRAST  Result Date: 01/19/2020 CLINICAL DATA:  TIA. Intermittent right arm weakness, numbness, and tingling. EXAM: CT ANGIOGRAPHY HEAD AND NECK TECHNIQUE: Multidetector CT imaging of the head and neck was  performed using the standard protocol during bolus administration of intravenous contrast. Multiplanar CT image reconstructions and MIPs were obtained to evaluate the vascular anatomy. Carotid stenosis measurements (when applicable) are obtained utilizing NASCET criteria, using the distal internal carotid diameter as the denominator. CONTRAST:  80mL OMNIPAQUE IOHEXOL 350 MG/ML SOLN COMPARISON:  Head and cervical spine MRI 01/19/2020 FINDINGS: CT HEAD FINDINGS Brain: There is no evidence of an acute infarct, intracranial hemorrhage, mass, midline shift, or extra-axial fluid collection. Hypodensities in the cerebral white matter bilaterally are nonspecific but compatible with mild chronic small vessel ischemic disease. There is a small chronic left frontal cortical infarct, and there is also a chronic lacunar infarct in the left thalamus. The ventricles are normal in size. Vascular: Calcified atherosclerosis at the skull base. Skull: No fracture or suspicious osseous lesion. Sinuses: Mild mucosal thickening inferiorly in the left maxillary sinus. Clear mastoid air cells. Orbits:  Unremarkable. Review of the MIP images confirms the above findings CTA NECK FINDINGS Aortic arch: Standard 3 vessel aortic arch with mild atherosclerotic plaque. No arch vessel origin stenosis. Right carotid system: Patent with a moderate amount of calcified and soft plaque in the carotid bulb. No evidence of significant stenosis or dissection. Minimal nonstenotic calcified plaque in the mid common carotid artery. Left carotid system: Patent with prominent calcified and soft plaque at the carotid bifurcation resulting in 60% proximal ICA stenosis. Vertebral arteries: Patent and codominant with atherosclerotic plaque at the vertebral artery origins resulting in severe stenosis on the left and moderate to severe stenosis on the right. Skeleton: Cervical disc and facet degeneration more fully evaluated on today's cervical spine MRI. Sclerosis centrally in the T1 vertebral body, also present on a 2014 chest CTA and considered benign. Other neck: No evidence of cervical lymphadenopathy or mass. Upper chest: Clear lung apices. Review of the MIP images confirms the above findings CTA HEAD FINDINGS Anterior circulation: The internal carotid arteries are patent from skull base to carotid termini with atherosclerotic plaque resulting in mild bilateral cavernous and moderate left greater than right proximal supraclinoid stenoses. ACAs and MCAs are patent without evidence of a proximal branch occlusion or significant proximal stenosis. No aneurysm is identified. Posterior circulation: The intracranial vertebral arteries are patent to the basilar with calcified plaque resulting in multifocal proximal V4 segment stenoses, moderate on the right and mild on the left. Patent right PICA, left AICA, and bilateral SCA origins are identified. The basilar artery is widely patent. Posterior communicating arteries are not clearly identified and may be diminutive or absent. PCAs are patent with mild branch vessel irregularity and  attenuation but no flow limiting proximal stenosis. No aneurysm is identified. Venous sinuses: Patent. Anatomic variants: None. Review of the MIP images confirms the above findings IMPRESSION: HEAD CT: 1. No evidence of acute intracranial abnormality. 2. 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 HEAD AND NECK CTA: 1. No large vessel occlusion. 2. Intracranial atherosclerosis including moderate bilateral ICA stenoses and moderate right and mild left V4 stenoses. 3. 60% proximal left ICA stenosis. 4. Severe left greater than right vertebral artery origin stenoses. 5. Aortic Atherosclerosis (ICD10-I70.0). Electronically Signed   By: Logan Bores M.D.   On: 01/19/2020 21:30   CT ANGIO NECK W OR WO CONTRAST  Result Date: 01/19/2020 CLINICAL DATA:  TIA. Intermittent right arm weakness, numbness, and tingling. EXAM: CT ANGIOGRAPHY HEAD AND NECK TECHNIQUE: Multidetector CT imaging of the head and neck was performed using the standard  protocol during bolus administration of intravenous contrast. Multiplanar CT image reconstructions and MIPs were obtained to evaluate the vascular anatomy. Carotid stenosis measurements (when applicable) are obtained utilizing NASCET criteria, using the distal internal carotid diameter as the denominator. CONTRAST:  2mL OMNIPAQUE IOHEXOL 350 MG/ML SOLN COMPARISON:  Head and cervical spine MRI 01/19/2020 FINDINGS: CT HEAD FINDINGS Brain: There is no evidence of an acute infarct, intracranial hemorrhage, mass, midline shift, or extra-axial fluid collection. Hypodensities in the cerebral white matter bilaterally are nonspecific but compatible with mild chronic small vessel ischemic disease. There is a small chronic left frontal cortical infarct, and there is also a chronic lacunar infarct in the left thalamus. The ventricles are normal in size. Vascular: Calcified atherosclerosis at the skull base. Skull: No fracture or suspicious  osseous lesion. Sinuses: Mild mucosal thickening inferiorly in the left maxillary sinus. Clear mastoid air cells. Orbits: Unremarkable. Review of the MIP images confirms the above findings CTA NECK FINDINGS Aortic arch: Standard 3 vessel aortic arch with mild atherosclerotic plaque. No arch vessel origin stenosis. Right carotid system: Patent with a moderate amount of calcified and soft plaque in the carotid bulb. No evidence of significant stenosis or dissection. Minimal nonstenotic calcified plaque in the mid common carotid artery. Left carotid system: Patent with prominent calcified and soft plaque at the carotid bifurcation resulting in 60% proximal ICA stenosis. Vertebral arteries: Patent and codominant with atherosclerotic plaque at the vertebral artery origins resulting in severe stenosis on the left and moderate to severe stenosis on the right. Skeleton: Cervical disc and facet degeneration more fully evaluated on today's cervical spine MRI. Sclerosis centrally in the T1 vertebral body, also present on a 2014 chest CTA and considered benign. Other neck: No evidence of cervical lymphadenopathy or mass. Upper chest: Clear lung apices. Review of the MIP images confirms the above findings CTA HEAD FINDINGS Anterior circulation: The internal carotid arteries are patent from skull base to carotid termini with atherosclerotic plaque resulting in mild bilateral cavernous and moderate left greater than right proximal supraclinoid stenoses. ACAs and MCAs are patent without evidence of a proximal branch occlusion or significant proximal stenosis. No aneurysm is identified. Posterior circulation: The intracranial vertebral arteries are patent to the basilar with calcified plaque resulting in multifocal proximal V4 segment stenoses, moderate on the right and mild on the left. Patent right PICA, left AICA, and bilateral SCA origins are identified. The basilar artery is widely patent. Posterior communicating arteries are  not clearly identified and may be diminutive or absent. PCAs are patent with mild branch vessel irregularity and attenuation but no flow limiting proximal stenosis. No aneurysm is identified. Venous sinuses: Patent. Anatomic variants: None. Review of the MIP images confirms the above findings IMPRESSION: HEAD CT: 1. No evidence of acute intracranial abnormality. 2. 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 HEAD AND NECK CTA: 1. No large vessel occlusion. 2. Intracranial atherosclerosis including moderate bilateral ICA stenoses and moderate right and mild left V4 stenoses. 3. 60% proximal left ICA stenosis. 4. Severe left greater than right vertebral artery origin stenoses. 5. Aortic Atherosclerosis (ICD10-I70.0). Electronically Signed   By: Logan Bores M.D.   On: 01/19/2020 21:30   MR BRAIN WO CONTRAST  Result Date: 01/19/2020 CLINICAL DATA:  TIA. Intermittent right arm weakness, numbness, and tingling. EXAM: MRI HEAD WITHOUT CONTRAST TECHNIQUE: Multiplanar, multiecho pulse sequences of the brain and surrounding structures were obtained without intravenous contrast. COMPARISON:  Head CT 11/09/2013 FINDINGS:  Brain: No acute infarct, mass, midline shift, or extra-axial fluid collection is identified. There is a small focus of chronic hemorrhage in the left cerebellar hemisphere, and there are also a few scattered chronic cerebral microhemorrhages. The ventricles are normal in size. Small foci of T2 hyperintensity in the cerebral white matter bilaterally are nonspecific but compatible with mild chronic small vessel ischemic disease. A small chronic cortical infarct in the left middle frontal gyrus was present on the 2015 CT. There is also a subcentimeter chronic cortical infarct in the left superior frontal gyrus. A chronic lacunar infarct is noted in the left thalamus. Vascular: Major intracranial vascular flow voids are preserved. Skull and upper  cervical spine: Unremarkable bone marrow signal. Sinuses/Orbits: Unremarkable orbits. Minimal mucosal thickening in the left maxillary sinus. Clear mastoid air cells. Other: None. IMPRESSION: 1. No acute intracranial abnormality. 2. Mild chronic small vessel ischemic disease with small chronic infarcts as above. Electronically Signed   By: Logan Bores M.D.   On: 01/19/2020 21:15   MR CERVICAL SPINE WO CONTRAST  Result Date: 01/19/2020 CLINICAL DATA:  Cervical radiculopathy EXAM: MRI CERVICAL SPINE WITHOUT CONTRAST TECHNIQUE: Multiplanar, multisequence MR imaging of the cervical spine was performed. No intravenous contrast was administered. COMPARISON:  None. FINDINGS: Alignment: There is straightening of the normal cervical lordosis. Vertebrae: The vertebral body heights are well maintained. No fracture, marrow edema,or pathologic marrow infiltration. Cord: Normal signal and morphology. Posterior Fossa, vertebral arteries, paraspinal tissues: The visualized portion of the posterior fossa is unremarkable. Normal flow voids seen within the vertebral arteries. The paraspinal soft tissues are unremarkable. Disc levels: C1-C2: Atlanto-axial junction is normal, without canal narrowing C2-C3: There is a minimal disc osteophyte complex which causes moderate left and mild right neural foraminal narrowing. C3-C4: There is a disc osteophyte complex and uncovertebral osteophytes which causes severe right and moderate left neural foraminal narrowing. C4-C5: There is a disc osteophyte complex and uncovertebral osteophytes which causes moderate right and severe left neural foraminal narrowing as well as mild central canal stenosis. C5-C6: Disc osteophyte complex and uncovertebral osteophytes are present which causes moderate to severe left and moderate right neural foraminal narrowing. C6-C7: Cervical spine spondylosis with disc osteophyte complex and uncovertebral osteophytes are seen which causes severe bilateral neural  foraminal narrowing. There is also a left lateral recess disc protrusion which contacts the exiting left C7 nerve root. C7-T1: No significant spinal canal or neural foraminal narrowing IMPRESSION: Straightening of the normal cervical lordosis. 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 Electronically Signed   By: Prudencio Pair M.D.   On: 01/19/2020 20:13    Microbiology: Recent Results (from the past 240 hour(s))  SARS Coronavirus 2 by RT PCR (hospital order, performed in [facility] hospital lab) Nasopharyngeal Nasopharyngeal Swab     Status: None   Collection Time: 01/19/20  6:22 PM   Specimen: Nasopharyngeal Swab  Result Value Ref Range Status   SARS Coronavirus 2 NEGATIVE NEGATIVE Final    Comment: (NOTE) SARS-CoV-2 target nucleic acids are NOT DETECTED.  The SARS-CoV-2 RNA is generally detectable in upper and lower respiratory specimens during the acute phase of infection. The lowest concentration of SARS-CoV-2 viral copies this assay can detect is 250 copies / mL. A negative result does not preclude SARS-CoV-2 infection and should not be used  as the sole basis for treatment or other patient management decisions.  A negative result may occur with improper specimen collection / handling, submission of specimen other than nasopharyngeal swab, presence of viral mutation(s) within the areas targeted by this assay, and inadequate number of viral copies (<250 copies / mL). A negative result must be combined with clinical observations, patient history, and epidemiological information.  Fact Sheet for Patients:   StrictlyIdeas.no  Fact Sheet for Healthcare Providers: BankingDealers.co.za  This test is not yet approved  or  cleared by the [place] FDA and has been authorized for detection and/or diagnosis of SARS-CoV-2 by FDA under an Emergency Use Authorization (EUA).  This EUA will remain in effect (meaning this test can be used) for the duration of the COVID-19 declaration under Section 564(b)(1) of the Act, 21 U.S.C. section 360bbb-3(b)(1), unless the authorization is terminated or revoked sooner.  Performed at [facility], [place] [address] [place], [place] [address]      Labs: Basic Metabolic Panel: Recent Labs  Lab 01/19/20 1455 01/19/20 1538 01/20/20 0413  NA 137 140 138  K 4.0 3.9 3.5  CL 104 104 105  CO2 23  --  24  GLUCOSE 118* 112* 108*  BUN 10 11 8   CREATININE 0.89 0.70 0.86  CALCIUM 9.1  --  8.9   Liver Function Tests: Recent Labs  Lab 01/19/20 1455  AST 33  ALT 25  ALKPHOS 39  BILITOT 0.8  PROT 6.5  ALBUMIN 3.9   No results for input(s): LIPASE, AMYLASE in the last 168 hours. No results for input(s): AMMONIA in the last 168 hours. CBC: Recent Labs  Lab 01/19/20 1455 01/19/20 1538 01/20/20 0413  WBC 5.8  --  4.8  NEUTROABS 4.5  --   --   HGB 14.1 13.6 13.4  HCT 42.2 40.0 40.3  MCV 94.4  --  93.3  PLT 162  --  152   Cardiac Enzymes: No results for input(s): CKTOTAL, CKMB, CKMBINDEX, TROPONINI in the last 168 hours. BNP: BNP (last 3 results) No results for input(s): BNP in the last 8760 hours.  ProBNP (last 3 results) No results for input(s): PROBNP in the last 8760 hours.  CBG: Recent Labs  Lab 01/20/20 0146  GLUCAP 94       Signed:  Oswald Hillock MD.  [facility] 01/20/2020, 2:49 PM

## 2020-01-20 NOTE — Evaluation (Signed)
Physical Therapy Evaluation Patient Details Name: Kevin Webb MRN: [id number] DOB: 1945/11/12 Today's Date: 01/20/2020   History of Present Illness  [name] is a 74 y.o. male with a past medical history of coronary artery disease, hyperlipidemia, diabetes mellitus type 2, essential hypertension who was in his usual state of health till this morning when he woke up around 6 AM and felt that his right arm had fallen asleep.  Apparently his blood pressure was 178/111 at that time.  He had not taken his blood pressure medication the night before.  He felt that his right arm was not working like it showed when he was trying to open his medication bottle.  However he was still able to use that arm.  He went to work.  He is a Development worker, community.  When he was also trying to attempt to use a wall phone he felt that he was unable to dial the numbers like he normal.  He subsequently called his primary care physician.  He was advised to come to the emergency department.  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  Clinical Impression  Pt is at his baseline functioning and should be safe at home. There are no further acute PT needs.  Will sign off at this time.     Follow Up Recommendations No PT follow up    Equipment Recommendations  None recommended by PT    Recommendations for Other Services       Precautions / Restrictions        Mobility  Bed Mobility Overal bed mobility: Independent                Transfers Overall transfer level: Independent                  Ambulation/Gait Ambulation/Gait assistance: Independent Gait Distance (Feet): 400 Feet Assistive device: None Gait Pattern/deviations: Step-through pattern   Gait velocity interpretation: >4.37 ft/sec, indicative of normal walking speed General Gait Details: safe and steady, gait speed age appropriate.  Stairs Stairs: Yes Stairs assistance:  Independent Stair Management: No rails;Alternating pattern;Forwards Number of Stairs: 10    Wheelchair Mobility    Modified Rankin (Stroke Patients Only) Modified Rankin (Stroke Patients Only) Pre-Morbid Rankin Score: No symptoms Modified Rankin: No symptoms     Balance Overall balance assessment: Independent                               Standardized Balance Assessment Standardized Balance Assessment : Berg Balance Test;Dynamic Gait Index Berg Balance Test Sit to Stand: Able to stand without using hands and stabilize independently Standing Unsupported: Able to stand safely 2 minutes Sitting with Back Unsupported but Feet Supported on Floor or Stool: Able to sit safely and securely 2 minutes Stand to Sit: Sits safely with minimal use of hands Transfers: Able to transfer safely, minor use of hands Standing Unsupported with Eyes Closed: Able to stand 10 seconds safely Standing Ubsupported with Feet Together: Able to place feet together independently and stand 1 minute safely From Standing, Reach Forward with Outstretched Arm: Can reach confidently >25 cm (10") From Standing Position, Pick up Object from Floor: Able to pick up shoe safely and easily From Standing Position, Turn to Look Behind Over each Shoulder: Looks behind from both sides and weight shifts well Turn 360 Degrees: Able to turn 360 degrees safely in  4 seconds or less Standing Unsupported, Alternately Place Feet on Step/Stool: Able to stand independently and safely and complete 8 steps in 20 seconds Standing Unsupported, One Foot in Front: Able to place foot tandem independently and hold 30 seconds Standing on One Leg: Able to lift leg independently and hold equal to or more than 3 seconds Total Score: 54 Dynamic Gait Index Level Surface: Normal Change in Gait Speed: Normal Gait with Horizontal Head Turns: Normal Gait with Vertical Head Turns: Normal Gait and Pivot Turn: Normal Step Over Obstacle:  Normal Step Around Obstacles: Normal Steps: Normal Total Score: 24       Pertinent Vitals/Pain Pain Assessment: No/denies pain    Home Living Family/patient expects to be discharged to:: Private residence Living Arrangements: Spouse/significant other Available Help at Discharge: Family;Available 24 hours/day Type of Home: House Home Access: Stairs to enter Entrance Stairs-Rails: Psychiatric nurse of Steps: several Home Layout: One level Home Equipment: None      Prior Function Level of Independence: Independent         Comments: works presently as a Engineer, technical sales: Right    Extremity/Trunk Assessment   Upper Extremity Assessment Upper Extremity Assessment: Overall WFL for tasks assessed    Lower Extremity Assessment Lower Extremity Assessment: Overall WFL for tasks assessed       Communication   Communication: No difficulties  Cognition Arousal/Alertness: Awake/alert Behavior During Therapy: WFL for tasks assessed/performed Overall Cognitive Status: Within Functional Limits for tasks assessed                                        General Comments General comments (skin integrity, edema, etc.): very low risks of falling.  Instructed pt in the signs of potential stroke  BE FAST and about understand his (pt's) own risk factors.    Exercises     Assessment/Plan    PT Assessment Patent does not need any further PT services  PT Problem List         PT Treatment Interventions      PT Goals (Current goals can be found in the Care Plan section)  Acute Rehab PT Goals Patient Stated Goal: back to work PT Goal Formulation: All assessment and education complete, DC therapy    Frequency     Barriers to discharge        Co-evaluation               AM-PAC PT "6 Clicks" Mobility  Outcome Measure Help needed turning from your back to your side while in a flat bed without using  bedrails?: None Help needed moving from lying on your back to sitting on the side of a flat bed without using bedrails?: None Help needed moving to and from a bed to a chair (including a wheelchair)?: None Help needed standing up from a chair using your arms (e.g., wheelchair or bedside chair)?: None Help needed to walk in hospital room?: None Help needed climbing 3-5 steps with a railing? : None 6 Click Score: 24    End of Session   Activity Tolerance: Patient tolerated treatment well     PT Visit Diagnosis: Unsteadiness on feet (R26.81)    Time: 1025-1100 PT Time Calculation (min) (ACUTE ONLY): 35 min   Charges:   PT Evaluation $PT Eval Low Complexity: 1 Low PT Treatments $Gait Training: 8-22  mins        01/20/2020  Ginger Carne., PT Acute Rehabilitation Services [phone]  (pager) [phone]  (office)  Tessie Fass Hallelujah Wysong 01/20/2020, 1:07 PM

## 2020-01-20 NOTE — Discharge Summary (Signed)
Physician Discharge Summary  Kevin Webb UXN:[id number] DOB: 02/29/1946 DOA: 01/19/2020  PCP: McLean-Scocuzza, Nino Glow, MD  Admit date: 01/19/2020 Discharge date: 01/20/2020  Time spent: 50 minutes  Recommendations for Outpatient Follow-up:  1. Follow-up neurosurgery in 2 weeks 2. Follow-up neurology in 2 weeks for EMG as outpatient  Discharge Diagnoses:  Principal Problem:   TIA (transient ischemic attack) Active Problems:   Type 2 diabetes mellitus without complication, without long-term current use of insulin (HCC)   Coronary artery disease involving native coronary artery of native heart without angina pectoris   Discharge Condition: Stable  Diet recommendation: Heart healthy diet      Filed Weights   01/19/20 1410 01/20/20 0327  Weight: 90.7 kg 90.9 kg    History of present illness:  74 year old male with medical history of CAD, hyperlipidemia, diabetes mellitus type 2, essential hypertension who came to hospital with right arm falling asleep.  Blood pressure was elevated 178/111.  No vision disturbance, no weakness in right leg.  No balance issues.  Patient symptom had resolved by the time he came to ED.  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  No acute infarct noted.  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  60% proximal left ICA stenosis with severe left greater than right vertebral artery origin stenosis. Neurology recommend outpatient EMG to confirm C-spine findings. Neurology discontinued patient's echocardiogram as patient had cardiac cath on 12/01/2019 with EF 55 to 60%.  As per neurology patient's findings were more likely from cervical  radiculopathy.  Suspicion for CVA is low. Outpatient neurosurgical evaluation for C-spine findings. I called and discussed with neurosurgery, Dr. Arnoldo Morale and patient has been given appointment to see him in 2 weeks. Patient will follow up with [facility] in 2 weeks for outpatient EMG.  Procedures:    Consultations:  Neurology  Discharge Exam:     Vitals:   01/20/20 0327 01/20/20 0800  BP: (!) 152/72 135/63  Pulse: (!) 51 (!) 49  Resp: 18 20  Temp: 97.9 F (36.6 C) 97.8 F (36.6 C)  SpO2: 97% 94%    General: Appears in no acute distress Cardiovascular: S1-S2, regular, no murmur auscultated Respiratory: Clear to auscultation bilaterally  Discharge Instructions       Discharge Instructions    Ambulatory referral to Neurology   Complete by: As directed    An appointment is requested in approximately: 2 weeks for outpatient EMG   Diet - low sodium heart healthy   Complete by: As directed    Increase activity slowly   Complete by: As directed           Allergies as of 01/20/2020      Reactions   Darvocet [propoxyphene N-acetaminophen] Hives   Propoxyphene Hives         Medication List    TAKE these medications   amLODipine 5 MG tablet Commonly known as: NORVASC TAKE ONE (1) TABLET EACH DAY What changed:   how much to take  how to take this  when to take this  additional instructions   aspirin EC 81 MG tablet Take 81 mg by mouth daily at 2 am. Swallow whole.   BOOST GLUCOSE CONTROL PO Take 1 capsule by mouth daily.  cholecalciferol 25 MCG (1000 UNIT) tablet Commonly known as: VITAMIN D Take 1,000 Units by mouth daily at 2 am.   clopidogrel 75 MG tablet Commonly known as: PLAVIX Take 1 tablet (75 mg total) by mouth daily. What changed: when to take this   CO Q-10 PO Take 1 capsule by mouth daily at 2 am.   ezetimibe 10 MG tablet Commonly known as: Zetia Take 1 tablet (10 mg total) by  mouth daily. What changed: when to take this   Fish Oil 1000 MG Caps Take 2,000 mg by mouth daily at 2 am.   GINKGO BILOBA PO Take 1 capsule by mouth daily at 2 am.   isosorbide mononitrate 60 MG 24 hr tablet Commonly known as: IMDUR Take 1 tablet (60 mg total) by mouth daily.   MAGNESIUM PO Take 1 tablet by mouth daily at 2 am.   metoprolol succinate 25 MG 24 hr tablet Commonly known as: Toprol XL Take 1 tablet (25 mg total) by mouth daily. What changed: when to take this   multivitamin with minerals Tabs tablet Take 1 tablet by mouth daily at 2 am.   naproxen sodium 220 MG tablet Commonly known as: ALEVE Take 220 mg by mouth 2 (two) times daily as needed (back pain.).   niacin 250 MG tablet Take 250 mg by mouth daily.   nitroGLYCERIN 0.4 MG SL tablet Commonly known as: NITROSTAT Place 1 tablet (0.4 mg total) under the tongue every 5 (five) minutes x 3 doses as needed. What changed: reasons to take this   pantoprazole 40 MG tablet Commonly known as: PROTONIX Take 1 tablet (40 mg total) by mouth 2 (two) times daily before a meal. 30 minutes. Try to reduce to 1x per day now or in 3 months What changed: when to take this   ramipril 10 MG capsule Commonly known as: ALTACE Take 1 capsule (10 mg total) by mouth daily. What changed: when to take this   rosuvastatin 40 MG tablet Commonly known as: CRESTOR Take 1 tablet (40 mg total) by mouth at bedtime. What changed: when to take this   tamsulosin 0.4 MG Caps capsule Commonly known as: FLOMAX Take 0.4 mg by mouth daily at 2 am.   vitamin C 1000 MG tablet Take 1,000 mg by mouth daily at 2 am. Reported on 09/26/2015   vitamin E 45 MG (100 UNITS) capsule Take 100 Units by mouth daily at 2 am.          Allergies  Allergen Reactions  . Darvocet [Propoxyphene N-Acetaminophen] Hives  . Propoxyphene Hives        Follow-up Information        Newman Pies, MD. Schedule an appointment as  soon as possible for a visit.   Specialty: Neurosurgery Why: Sharyn Lull is Dr. Cam Hai new patient coordinator. She will help get you set up as a new patient with Dr. Arnoldo Morale. Contact information: [address] [place] [address] [phone]             [facility]. Schedule an appointment as soon as possible for a visit in 2 week(s).   Specialty: Neurology Why: For EMG  Contact information: [address] [place] [phone]                 The results of significant diagnostics from this hospitalization (including imaging, microbiology, ancillary and laboratory) are listed below for reference.    Significant Diagnostic Studies:  Imaging Results  CT ANGIO HEAD W OR WO CONTRAST  Result Date: 01/19/2020 CLINICAL DATA:  TIA. Intermittent right arm weakness, numbness, and tingling. EXAM: CT ANGIOGRAPHY HEAD AND NECK TECHNIQUE: Multidetector CT imaging of the head and neck was performed using the standard protocol during bolus administration of intravenous contrast. Multiplanar CT image reconstructions and MIPs were obtained to evaluate the vascular anatomy. Carotid stenosis measurements (when applicable) are obtained utilizing NASCET criteria, using the distal internal carotid diameter as the denominator. CONTRAST:  54mL OMNIPAQUE IOHEXOL 350 MG/ML SOLN COMPARISON:  Head and cervical spine MRI 01/19/2020 FINDINGS: CT HEAD FINDINGS Brain: There is no evidence of an acute infarct, intracranial hemorrhage, mass, midline shift, or extra-axial fluid collection. Hypodensities in the cerebral white matter bilaterally are nonspecific but compatible with mild chronic small vessel ischemic disease. There is a small chronic left frontal cortical infarct, and there is also a chronic lacunar infarct in the left thalamus. The ventricles are normal in size. Vascular: Calcified atherosclerosis at the skull  base. Skull: No fracture or suspicious osseous lesion. Sinuses: Mild mucosal thickening inferiorly in the left maxillary sinus. Clear mastoid air cells. Orbits: Unremarkable. Review of the MIP images confirms the above findings CTA NECK FINDINGS Aortic arch: Standard 3 vessel aortic arch with mild atherosclerotic plaque. No arch vessel origin stenosis. Right carotid system: Patent with a moderate amount of calcified and soft plaque in the carotid bulb. No evidence of significant stenosis or dissection. Minimal nonstenotic calcified plaque in the mid common carotid artery. Left carotid system: Patent with prominent calcified and soft plaque at the carotid bifurcation resulting in 60% proximal ICA stenosis. Vertebral arteries: Patent and codominant with atherosclerotic plaque at the vertebral artery origins resulting in severe stenosis on the left and moderate to severe stenosis on the right. Skeleton: Cervical disc and facet degeneration more fully evaluated on today's cervical spine MRI. Sclerosis centrally in the T1 vertebral body, also present on a 2014 chest CTA and considered benign. Other neck: No evidence of cervical lymphadenopathy or mass. Upper chest: Clear lung apices. Review of the MIP images confirms the above findings CTA HEAD FINDINGS Anterior circulation: The internal carotid arteries are patent from skull base to carotid termini with atherosclerotic plaque resulting in mild bilateral cavernous and moderate left greater than right proximal supraclinoid stenoses. ACAs and MCAs are patent without evidence of a proximal branch occlusion or significant proximal stenosis. No aneurysm is identified. Posterior circulation: The intracranial vertebral arteries are patent to the basilar with calcified plaque resulting in multifocal proximal V4 segment stenoses, moderate on the right and mild on the left. Patent right PICA, left AICA, and bilateral SCA origins are identified. The basilar artery is widely patent.  Posterior communicating arteries are not clearly identified and may be diminutive or absent. PCAs are patent with mild branch vessel irregularity and attenuation but no flow limiting proximal stenosis. No aneurysm is identified. Venous sinuses: Patent. Anatomic variants: None. Review of the MIP images confirms the above findings IMPRESSION: HEAD CT: 1. No evidence of acute intracranial abnormality. 2. 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 HEAD AND NECK CTA: 1. No large vessel occlusion. 2. Intracranial atherosclerosis including moderate bilateral ICA stenoses and moderate right and mild left V4 stenoses. 3. 60% proximal left ICA stenosis. 4. Severe left greater than right vertebral artery origin stenoses. 5. Aortic Atherosclerosis (ICD10-I70.0). Electronically Signed   By: Logan Bores M.D.   On: 01/19/2020 21:30   CT  ANGIO NECK W OR WO CONTRAST  Result Date: 01/19/2020 CLINICAL DATA:  TIA. Intermittent right arm weakness, numbness, and tingling. EXAM: CT ANGIOGRAPHY HEAD AND NECK TECHNIQUE: Multidetector CT imaging of the head and neck was performed using the standard protocol during bolus administration of intravenous contrast. Multiplanar CT image reconstructions and MIPs were obtained to evaluate the vascular anatomy. Carotid stenosis measurements (when applicable) are obtained utilizing NASCET criteria, using the distal internal carotid diameter as the denominator. CONTRAST:  13mL OMNIPAQUE IOHEXOL 350 MG/ML SOLN COMPARISON:  Head and cervical spine MRI 01/19/2020 FINDINGS: CT HEAD FINDINGS Brain: There is no evidence of an acute infarct, intracranial hemorrhage, mass, midline shift, or extra-axial fluid collection. Hypodensities in the cerebral white matter bilaterally are nonspecific but compatible with mild chronic small vessel ischemic disease. There is a small chronic left frontal cortical infarct, and there is also a chronic lacunar  infarct in the left thalamus. The ventricles are normal in size. Vascular: Calcified atherosclerosis at the skull base. Skull: No fracture or suspicious osseous lesion. Sinuses: Mild mucosal thickening inferiorly in the left maxillary sinus. Clear mastoid air cells. Orbits: Unremarkable. Review of the MIP images confirms the above findings CTA NECK FINDINGS Aortic arch: Standard 3 vessel aortic arch with mild atherosclerotic plaque. No arch vessel origin stenosis. Right carotid system: Patent with a moderate amount of calcified and soft plaque in the carotid bulb. No evidence of significant stenosis or dissection. Minimal nonstenotic calcified plaque in the mid common carotid artery. Left carotid system: Patent with prominent calcified and soft plaque at the carotid bifurcation resulting in 60% proximal ICA stenosis. Vertebral arteries: Patent and codominant with atherosclerotic plaque at the vertebral artery origins resulting in severe stenosis on the left and moderate to severe stenosis on the right. Skeleton: Cervical disc and facet degeneration more fully evaluated on today's cervical spine MRI. Sclerosis centrally in the T1 vertebral body, also present on a 2014 chest CTA and considered benign. Other neck: No evidence of cervical lymphadenopathy or mass. Upper chest: Clear lung apices. Review of the MIP images confirms the above findings CTA HEAD FINDINGS Anterior circulation: The internal carotid arteries are patent from skull base to carotid termini with atherosclerotic plaque resulting in mild bilateral cavernous and moderate left greater than right proximal supraclinoid stenoses. ACAs and MCAs are patent without evidence of a proximal branch occlusion or significant proximal stenosis. No aneurysm is identified. Posterior circulation: The intracranial vertebral arteries are patent to the basilar with calcified plaque resulting in multifocal proximal V4 segment stenoses, moderate on the right and mild on the  left. Patent right PICA, left AICA, and bilateral SCA origins are identified. The basilar artery is widely patent. Posterior communicating arteries are not clearly identified and may be diminutive or absent. PCAs are patent with mild branch vessel irregularity and attenuation but no flow limiting proximal stenosis. No aneurysm is identified. Venous sinuses: Patent. Anatomic variants: None. Review of the MIP images confirms the above findings IMPRESSION: HEAD CT: 1. No evidence of acute intracranial abnormality. 2. 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 HEAD AND NECK CTA: 1. No large vessel occlusion. 2. Intracranial atherosclerosis including moderate bilateral ICA stenoses and moderate right and mild left V4 stenoses. 3. 60% proximal left ICA stenosis. 4. Severe left greater than right vertebral artery origin stenoses. 5. Aortic Atherosclerosis (ICD10-I70.0). Electronically Signed   By: Logan Bores M.D.   On: 01/19/2020 21:30   MR BRAIN WO CONTRAST  Result Date: 01/19/2020 CLINICAL DATA:  TIA. Intermittent right arm weakness, numbness, and tingling. EXAM: MRI HEAD WITHOUT CONTRAST TECHNIQUE: Multiplanar, multiecho pulse sequences of the brain and surrounding structures were obtained without intravenous contrast. COMPARISON:  Head CT 11/09/2013 FINDINGS: Brain: No acute infarct, mass, midline shift, or extra-axial fluid collection is identified. There is a small focus of chronic hemorrhage in the left cerebellar hemisphere, and there are also a few scattered chronic cerebral microhemorrhages. The ventricles are normal in size. Small foci of T2 hyperintensity in the cerebral white matter bilaterally are nonspecific but compatible with mild chronic small vessel ischemic disease. A small chronic cortical infarct in the left middle frontal gyrus was present on the 2015 CT. There is also a subcentimeter chronic cortical infarct in the left superior  frontal gyrus. A chronic lacunar infarct is noted in the left thalamus. Vascular: Major intracranial vascular flow voids are preserved. Skull and upper cervical spine: Unremarkable bone marrow signal. Sinuses/Orbits: Unremarkable orbits. Minimal mucosal thickening in the left maxillary sinus. Clear mastoid air cells. Other: None. IMPRESSION: 1. No acute intracranial abnormality. 2. Mild chronic small vessel ischemic disease with small chronic infarcts as above. Electronically Signed   By: Logan Bores M.D.   On: 01/19/2020 21:15   MR CERVICAL SPINE WO CONTRAST  Result Date: 01/19/2020 CLINICAL DATA:  Cervical radiculopathy EXAM: MRI CERVICAL SPINE WITHOUT CONTRAST TECHNIQUE: Multiplanar, multisequence MR imaging of the cervical spine was performed. No intravenous contrast was administered. COMPARISON:  None. FINDINGS: Alignment: There is straightening of the normal cervical lordosis. Vertebrae: The vertebral body heights are well maintained. No fracture, marrow edema,or pathologic marrow infiltration. Cord: Normal signal and morphology. Posterior Fossa, vertebral arteries, paraspinal tissues: The visualized portion of the posterior fossa is unremarkable. Normal flow voids seen within the vertebral arteries. The paraspinal soft tissues are unremarkable. Disc levels: C1-C2: Atlanto-axial junction is normal, without canal narrowing C2-C3: There is a minimal disc osteophyte complex which causes moderate left and mild right neural foraminal narrowing. C3-C4: There is a disc osteophyte complex and uncovertebral osteophytes which causes severe right and moderate left neural foraminal narrowing. C4-C5: There is a disc osteophyte complex and uncovertebral osteophytes which causes moderate right and severe left neural foraminal narrowing as well as mild central canal stenosis. C5-C6: Disc osteophyte complex and uncovertebral osteophytes are present which causes moderate to severe left and moderate right neural foraminal  narrowing. C6-C7: Cervical spine spondylosis with disc osteophyte complex and uncovertebral osteophytes are seen which causes severe bilateral neural foraminal narrowing. There is also a left lateral recess disc protrusion which contacts the exiting left C7 nerve root. C7-T1: No significant spinal canal or neural foraminal narrowing IMPRESSION: Straightening of the normal cervical lordosis. 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 Electronically Signed   By: Prudencio Pair M.D.   On: 01/19/2020 20:13     Microbiology:        Recent Results (from the past 240 hour(s))  SARS Coronavirus 2 by RT PCR (hospital order, performed in [facility] hospital lab) Nasopharyngeal Nasopharyngeal Swab     Status: None   Collection Time: 01/19/20  6:22 PM   Specimen: Nasopharyngeal Swab  Result Value Ref Range Status   SARS Coronavirus 2 NEGATIVE NEGATIVE Final    Comment: (NOTE) SARS-CoV-2 target nucleic acids are NOT  DETECTED.  The SARS-CoV-2 RNA is generally detectable in upper and lower respiratory specimens during the acute phase of infection. The lowest concentration of SARS-CoV-2 viral copies this assay can detect is 250 copies / mL. A negative result does not preclude SARS-CoV-2 infection and should not be used as the sole basis for treatment or other patient management decisions.  A negative result may occur with improper specimen collection / handling, submission of specimen other than nasopharyngeal swab, presence of viral mutation(s) within the areas targeted by this assay, and inadequate number of viral copies (<250 copies / mL). A negative result must be combined with clinical observations, patient history, and epidemiological information.  Fact Sheet for  Patients:   StrictlyIdeas.no  Fact Sheet for Healthcare Providers: BankingDealers.co.za  This test is not yet approved or  cleared by the [place] FDA and has been authorized for detection and/or diagnosis of SARS-CoV-2 by FDA under an Emergency Use Authorization (EUA).  This EUA will remain in effect (meaning this test can be used) for the duration of the COVID-19 declaration under Section 564(b)(1) of the Act, 21 U.S.C. section 360bbb-3(b)(1), unless the authorization is terminated or revoked sooner.  Performed at [facility], [place] [address] [place], [place] [address]      Labs: Basic Metabolic Panel: Last Labs        Recent Labs  Lab 01/19/20 1455 01/19/20 1538 01/20/20 0413  NA 137 140 138  K 4.0 3.9 3.5  CL 104 104 105  CO2 23  --  24  GLUCOSE 118* 112* 108*  BUN 10 11 8   CREATININE 0.89 0.70 0.86  CALCIUM 9.1  --  8.9     Liver Function Tests: Last Labs      Recent Labs  Lab 01/19/20 1455  AST 33  ALT 25  ALKPHOS 39  BILITOT 0.8  PROT 6.5  ALBUMIN 3.9     Last Labs   No results for input(s): LIPASE, AMYLASE in the last 168 hours.   Last Labs   No results for input(s): AMMONIA in the last 168 hours.   CBC: Last Labs        Recent Labs  Lab 01/19/20 1455 01/19/20 1538 01/20/20 0413  WBC 5.8  --  4.8  NEUTROABS 4.5  --   --   HGB 14.1 13.6 13.4  HCT 42.2 40.0 40.3  MCV 94.4  --  93.3  PLT 162  --  152     Cardiac Enzymes: Last Labs   No results for input(s): CKTOTAL, CKMB, CKMBINDEX, TROPONINI in the last 168 hours.   BNP: BNP (last 3 results) Recent Labs (within last 365 days)  No results for input(s): BNP in the last 8760 hours.    ProBNP (last 3 results) Recent Labs (within last 365 days)  No results for input(s): PROBNP in the last 8760 hours.    CBG: Last Labs      Recent Labs  Lab 01/20/20 0146  GLUCAP 94         Signed:  Oswald Hillock MD.  [facility] 01/20/2020, 2:49 PM      Revision History                     Note Details  Author Oswald Hillock, MD File Time 01/20/2020 2:56 PM  Author Type Physician Status Addendum  Last Editor Oswald Hillock, [place] # [id number] [place] Date 01/19/2020

## 2020-01-20 NOTE — Progress Notes (Signed)
Attempted to obtain report x 2.  Another RN gave report of the patient.  No questions asked about 3-4 drinks daily from MD H&P.  Unsure of CIWA indications per the RN's reprt from [place], [place]

## 2020-01-20 NOTE — Progress Notes (Signed)
OT Cancellation Note  Patient Details Name: SHAINE NEWMARK MRN: [id number] DOB: [birth year]   Cancelled Treatment:    Reason Eval/Treat Not Completed: OT screened, no needs identified, will sign off.Nilsa Nutting., OTR/L Acute Rehabilitation Services Pager [phone] Office [phone]   Lucille Passy M 01/20/2020, 3:10 PM

## 2020-01-20 NOTE — Plan of Care (Signed)
Discussed with patient plan of care for the evening, pain managment and admission question with some teach back displayed

## 2020-01-20 NOTE — Progress Notes (Signed)
SLP Cancellation Note  Patient Details Name: QUANTAE MARTEL MRN: [id number] DOB: 1945/11/15   Cancelled treatment:       Reason Eval/Treat Not Completed: SLP screened, Primary complaint is arm weakness, MRI negative.  Pt passed RN administered Yale swallow protocol. No needs identified to warrant cognitive linguistic eval, will sign off.    Chelby Salata, Katherene Ponto 01/20/2020, 7:28 AM

## 2020-01-22 ENCOUNTER — Encounter: Payer: Self-pay | Admitting: Internal Medicine

## 2020-01-23 ENCOUNTER — Other Ambulatory Visit: Payer: Medicare Other

## 2020-01-23 NOTE — Telephone Encounter (Signed)
See previous encounter for 9/06

## 2020-01-29 ENCOUNTER — Telehealth: Payer: Self-pay | Admitting: Internal Medicine

## 2020-01-29 NOTE — Telephone Encounter (Signed)
Hi Dr. Arnoldo Morale Do you manage interventions for intracranial atherosclerosis ?  If not who does in [place]?  If so this patient was recently discharge for the above but I do not see f/u appt with you   Does he have one scheduled?   Thank you [place]

## 2020-01-29 NOTE — Telephone Encounter (Signed)
[facility] discharge is rec f/u in 2 weeks  Do you all have a sooner appt for this kind patient? If so please contact the patient    Thanks tMS

## 2020-01-30 NOTE — Telephone Encounter (Signed)
His new patient visit has been moved to 02/02/20. His sister has the location, phone number and time of appt. He will arrive early for check-in.

## 2020-02-02 ENCOUNTER — Ambulatory Visit: Payer: Medicare Other | Admitting: Cardiology

## 2020-02-02 ENCOUNTER — Encounter: Payer: Self-pay | Admitting: Neurology

## 2020-02-02 ENCOUNTER — Ambulatory Visit (INDEPENDENT_AMBULATORY_CARE_PROVIDER_SITE_OTHER): Payer: Medicare Other | Admitting: Neurology

## 2020-02-02 VITALS — BP 169/83 | HR 61 | Ht 69.0 in | Wt 209.0 lb

## 2020-02-02 DIAGNOSIS — R29898 Other symptoms and signs involving the musculoskeletal system: Secondary | ICD-10-CM | POA: Insufficient documentation

## 2020-02-02 DIAGNOSIS — I6522 Occlusion and stenosis of left carotid artery: Secondary | ICD-10-CM | POA: Diagnosis not present

## 2020-02-02 HISTORY — DX: Other symptoms and signs involving the musculoskeletal system: R29.898

## 2020-02-02 NOTE — Progress Notes (Signed)
Chief Complaint  Patient presents with  . New Patient (Initial Visit)    here for cervicalgia; pt states two or three Fridays ago he woke up at 4 AM, did a few things, fell back asleep then when he woke up his R arm wouldn't move. That has happened before. He thought he slept on it wrong. He tried to open his medicine bottle but his R hand was weak. He called the doctor. He went to the ER. He was checked for stroke and was told he may have a nerve problem. He had imaging completed and was referred to neurology. was told he had a stroke at some point, seen on MRI.    [facility]    Oswald Hillock, MD  . New Room    here alone has mask on     Buckhorn is a 74 year old right-handed male, seen in request by Dr. Eleonore Chiquito S for evaluation of right arm transient weakness, initial evaluation was on February 02, 2020  I reviewed and summarized the referring note. PMHX CAD, asa and Plavix HTN HLD  He had a history of right shoulder injury in 2015, was throw off the horse back while riding, landed on his right shoulder, had right face bruise, left wrist fracture, was seen by orthopedic at that time, no intervention was needed, he did have limited range of motion initially, then gradually improved  He tends to sleep on his right side, over the past 3 months, he had intermittent 3 similar episode, most noticeable was on January 19, 2020, he woke up at 4 AM, then fell back to sleep again, when he wake up couple hours later around 6 AM, he noticed difficulty raising right shoulder, he contributed to the fact that he was lying on his right shoulder sleeping, with the help of left hand, he was able to move his right shoulder within few minutes, he denies significant pain, no sensory loss, while he opened his medicine bottle, he noticed that he did not have full grip, but he was still able to twist off the bottle, he was able to finish his daily routine went to work without any  difficulty  4 hours later, around 10 AM, at work, while he was trying to pick up the phone from the wall, he noticed mild heaviness of right arm, no sensory loss, no facial asymmetry, language difficulty, only transient less than 1 minute,  He reported to his primary care physician, was instructed to emergency room for evaluation   I personally reviewed MRI of the brain without contrast on January 19, 2020, no acute abnormality, mild supratentorium small vessel disease.  MRI of cervical spine: Straightening of normal cervical lordosis, cervical spondylosis, most noticeable C4-5, with severe left foraminal narrowing, C6-7, severe bilateral neuroforaminal narrowing, and the left lateral recess disc protrusion, contacting the existing left C7 nerve roots  CT angiogram of head and neck showed severe left greater than right vertebral artery origin stenosis, aortic atherosclerosis, 60% left ICA stenosis, intracranial atherosclerosis,  Laboratory evaluations showed A1c 5.9, BMP glucose 108, creatinine 0.86, CBC was normal hemoglobin 13.4, LDL 51,  REVIEW OF SYSTEMS: Full 14 system review of systems performed and notable only for as above All other review of systems were negative.  ALLERGIES: Allergies  Allergen Reactions  . Darvocet [Propoxyphene N-Acetaminophen] Hives  . Propoxyphene Hives    HOME MEDICATIONS: Current Outpatient Medications  Medication Sig Dispense Refill  . amLODipine (NORVASC) 5 MG  tablet TAKE ONE (1) TABLET EACH DAY (Patient taking differently: Take 5 mg by mouth daily at 2 am. ) 90 tablet 3  . Ascorbic Acid (VITAMIN C) 1000 MG tablet Take 1,000 mg by mouth daily at 2 am. Reported on 09/26/2015    . aspirin EC 81 MG tablet Take 81 mg by mouth daily at 2 am. Swallow whole.    . cholecalciferol (VITAMIN D) 25 MCG (1000 UNIT) tablet Take 1,000 Units by mouth daily at 2 am.    . clopidogrel (PLAVIX) 75 MG tablet Take 1 tablet (75 mg total) by mouth daily. (Patient taking  differently: Take 75 mg by mouth daily at 2 am. ) 90 tablet 3  . Coenzyme Q10 (CO Q-10 PO) Take 1 capsule by mouth daily at 2 am.    . ezetimibe (ZETIA) 10 MG tablet Take 1 tablet (10 mg total) by mouth daily. (Patient taking differently: Take 10 mg by mouth daily at 2 am. ) 90 tablet 3  . GINKGO BILOBA PO Take 1 capsule by mouth daily at 2 am.     . isosorbide mononitrate (IMDUR) 60 MG 24 hr tablet Take 1 tablet (60 mg total) by mouth daily. 90 tablet 3  . MAGNESIUM PO Take 1 tablet by mouth daily at 2 am.    . metoprolol succinate (TOPROL XL) 25 MG 24 hr tablet Take 1 tablet (25 mg total) by mouth daily. (Patient taking differently: Take 25 mg by mouth daily at 2 am. ) 90 tablet 3  . Multiple Vitamin (MULTIVITAMIN WITH MINERALS) TABS tablet Take 1 tablet by mouth daily at 2 am.    . naproxen sodium (ALEVE) 220 MG tablet Take 220 mg by mouth 2 (two) times daily as needed (back pain.).    [facility] niacin 250 MG tablet Take 250 mg by mouth daily.    . nitroGLYCERIN (NITROSTAT) 0.4 MG SL tablet Place 1 tablet (0.4 mg total) under the tongue every 5 (five) minutes x 3 doses as needed. (Patient taking differently: Place 0.4 mg under the tongue every 5 (five) minutes x 3 doses as needed for chest pain. ) 25 tablet 4  . Nutritional Supplements (BOOST GLUCOSE CONTROL PO) Take 1 capsule by mouth daily.    . Omega-3 Fatty Acids (FISH OIL) 1000 MG CAPS Take 2,000 mg by mouth daily at 2 am.     . pantoprazole (PROTONIX) 40 MG tablet Take 1 tablet (40 mg total) by mouth 2 (two) times daily before a meal. 30 minutes. Try to reduce to 1x per day now or in 3 months (Patient taking differently: Take 40 mg by mouth daily at 2 am. 30 minutes. Try to reduce to 1x per day now or in 3 months) 180 tablet 3  . ramipril (ALTACE) 10 MG capsule Take 1 capsule (10 mg total) by mouth daily. (Patient taking differently: Take 10 mg by mouth daily at 2 am. ) 90 capsule 3  . rosuvastatin (CRESTOR) 40 MG tablet Take 1 tablet (40 mg total)  by mouth at bedtime. (Patient taking differently: Take 40 mg by mouth daily at 2 am. ) 90 tablet 3  . tamsulosin (FLOMAX) 0.4 MG CAPS capsule Take 0.4 mg by mouth daily at 2 am.     . vitamin E 100 UNIT capsule Take 100 Units by mouth daily at 2 am.      Current Facility-Administered Medications  Medication Dose Route Frequency Provider Last Rate Last Admin  . sodium chloride flush (NS) 0.9 % injection  3 mL  3 mL Intravenous Q12H Almyra Deforest, [place]        PAST MEDICAL HISTORY: Past Medical History:  Diagnosis Date  . Acute cholecystitis 04/18/2013  . Adenomatous colon polyp 12/2004  . Anemia   . Blood transfusion without reported diagnosis   . BPH associated with nocturia   . CAD (coronary artery disease)    a. s/p CABG x4 in 2000 with LIMA-LAD, reverse SVG-IM, reverse SVG-acute margin and reverse SVG-RCA b. s/p BMS to SVG-RI in 2012 c. 12/2016: PCI/DES to SVG-RCA and PCI/DES to SVG-RI (Dr. [name])   . Carotid artery stenosis    mild   . Cataract   . Clotting disorder ([place])   . Diabetes mellitus without complication ([place]) 73/41/9379   recent A1C 6.1 12/25/16 controlled with diet and exercise   . Diverticulitis 2008/2009  . GERD (gastroesophageal reflux disease)    controlled as of 04/01/17   . HSV infection   . HTN (hypertension)   . Hx of dysplastic nevus 05/04/2018   upper back spinal   . Hyperglycemia   . Hyperlipidemia   . Impingement syndrome of left shoulder 2016  . Low testosterone    Dr [name], [place] ,[place]  . Myocardial infarction ([place])   . Osteoarthritis of right knee    With trace effusion  . Peyronie disease   . RLS (restless legs syndrome) 08/15/2013  . Rotator cuff injury    right; at 80-17 years old.   . Sinus bradycardia   . Thrombocytopenia ([place])   . Vitamin D deficiency     PAST SURGICAL HISTORY: Past Surgical History:  Procedure Laterality Date  . CHOLECYSTECTOMY N/A 04/21/2013   Procedure: LAPAROSCOPIC CHOLECYSTECTOMY WITH INTRAOPERATIVE  CHOLANGIOGRAM;  Surgeon: Haywood Lasso, MD;  Location: [facility];  Service: General;  Laterality: N/A;  . CHOLECYSTECTOMY    . COLONOSCOPY W/ POLYPECTOMY  2004 & 2009    X 2; Dr Fuller Plan; due 2014  . CORONARY ANGIOPLASTY    . CORONARY ARTERY BYPASS GRAFT  04/1999   4 vessels  . CORONARY STENT INTERVENTION N/A 12/28/2016   Procedure: CORONARY STENT INTERVENTION;  Surgeon: Lorretta Harp, MD;  Location: [facility];  Service: Cardiovascular;  Laterality: N/A;  . CORONARY STENT PLACEMENT  2012   Plavix  . LAPAROSCOPIC CHOLECYSTECTOMY  04/21/2013   Dr Margot Chimes; acutecholecystitis with necrosis  . LEFT HEART CATH AND CORS/GRAFTS ANGIOGRAPHY N/A 12/28/2016   Procedure: LEFT HEART CATH AND CORS/GRAFTS ANGIOGRAPHY;  Surgeon: Lorretta Harp, MD;  Location: [facility];  Service: Cardiovascular;  Laterality: N/A;  . LEFT HEART CATH AND CORS/GRAFTS ANGIOGRAPHY N/A 12/01/2019   Procedure: LEFT HEART CATH AND CORS/GRAFTS ANGIOGRAPHY;  Surgeon: Martinique, Peter M, MD;  Location: [facility];  Service: Cardiovascular;  Laterality: N/A;  . VASECTOMY      FAMILY HISTORY: Family History  Problem Relation Age of Onset  . Aortic aneurysm Father 46       ? abdominal  . Heart attack Father 29  . Diabetes Other        PGaunt  . Coronary artery disease Mother        CABG in 5s  . CVA Mother        cns bleed from warfarin  . Aneurysm Mother   . Diabetes Brother   . Cholecystitis Sister   . Cancer Neg Hx   . Colon cancer Neg Hx   . Prostate cancer Neg Hx   . Esophageal cancer  Neg Hx   . Stomach cancer Neg Hx   . Rectal cancer Neg Hx     SOCIAL HISTORY: Social History   Socioeconomic History  . Marital status: Married    Spouse name: Not on file  . Number of children: 1  . Years of education: Not on file  . Highest education level: Not on file  Occupational History  . Occupation:      Comment: plumber  Tobacco Use  . Smoking status: Never Smoker  . Smokeless tobacco:  Never Used  Vaping Use  . Vaping Use: Never used  Substance and Sexual Activity  . Alcohol use: Yes    Alcohol/week: 10.0 - 12.0 standard drinks    Types: 10 - 12 Cans of beer per week    Comment: drinks beer daily; whiskey rarely, used to be daily 1-2 shots  . Drug use: No  . Sexual activity: Yes    Birth control/protection: None  Other Topics Concern  . Not on file  Social History Narrative   Remarried 2014   1 son age 56 as of 03/2017       Lives at home with spouse   Right handed   Caffeine: 1-2 cups/day (coffee, green tea)   Social Determinants of Health   Financial Resource Strain:   . Difficulty of Paying Living Expenses: Not on file  Food Insecurity:   . Worried About Charity fundraiser in the Last Year: Not on file  . Ran Out of Food in the Last Year: Not on file  Transportation Needs:   . Lack of Transportation (Medical): Not on file  . Lack of Transportation (Non-Medical): Not on file  Physical Activity:   . Days of Exercise per Week: Not on file  . Minutes of Exercise per Session: Not on file  Stress:   . Feeling of Stress : Not on file  Social Connections: Unknown  . Frequency of Communication with Friends and Family: Not on file  . Frequency of Social Gatherings with Friends and Family: Not on file  . Attends Religious Services: Not on file  . Active Member of Clubs or Organizations: Not on file  . Attends Archivist Meetings: Not on file  . Marital Status: Married  Human resources officer Violence:   . Fear of Current or Ex-Partner: Not on file  . Emotionally Abused: Not on file  . Physically Abused: Not on file  . Sexually Abused: Not on file     PHYSICAL EXAM   Vitals:   02/02/20 1033  BP: (!) 169/83  Pulse: 61  Weight: 209 lb (94.8 kg)  Height: 5\' 9"  (1.753 m)   Not recorded     Body mass index is 30.86 kg/m.  PHYSICAL EXAMNIATION:  Gen: NAD, conversant, well nourised, well groomed                     Cardiovascular: Regular  rate rhythm, no peripheral edema, warm, nontender. Eyes: Conjunctivae clear without exudates or hemorrhage Neck: Supple, no carotid bruits. Pulmonary: Clear to auscultation bilaterally   NEUROLOGICAL EXAM:  MENTAL STATUS: Speech:    Speech is normal; fluent and spontaneous with normal comprehension.  Cognition:     Orientation to time, place and person     Normal recent and remote memory     Normal Attention span and concentration     Normal Language, naming, repeating,spontaneous speech     Fund of knowledge   CRANIAL NERVES: CN II: Visual  fields are full to confrontation. Pupils are round equal and briskly reactive to light. CN III, IV, VI: extraocular movement are normal. No ptosis. CN V: Facial sensation is intact to light touch CN VII: Face is symmetric with normal eye closure  CN VIII: Hearing is normal to causal conversation. CN IX, X: Phonation is normal. CN XI: Head turning and shoulder shrug are intact  MOTOR: There is no pronator drift of out-stretched arms. Muscle bulk and tone are normal. Muscle strength is normal.  REFLEXES: Reflexes are 2+ and symmetric at the biceps, triceps, knees, and ankles. Plantar responses are flexor.  SENSORY: Intact to light touch, pinprick and vibratory sensation are intact in fingers and toes.  COORDINATION: There is no trunk or limb dysmetria noted.  GAIT/STANCE: Posture is normal. Gait is steady with normal steps, base, arm swing, and turning. Heel and toe walking are normal. Tandem gait is normal.  Romberg is absent.   DIAGNOSTIC DATA (LABS, IMAGING, TESTING) - I reviewed patient records, labs, notes, testing and imaging myself where available.   ASSESSMENT AND PLAN  Kevin WITHEM is a 74 y.o. male   Transient right arm weakness,  Can due to multiple factors, including limited range of motion of right shoulder, history of right shoulder injury, history does not suggestive of left hemisphere stroke,  I have educated him  about the potential stroke symptoms, advised him to contact our office for recurrent symptoms  Left carotid artery stenosis  CT angiogram showed 60% left proximal internal carotid artery stenosis,  He does has vascular risk factor of hypertension, hyperlipidemia, coronary artery disease, prediabetes,  Continue aspirin, Plavix  Also encouraged him increase water intake, moderate exercise   Marcial Pacas, M.D. Ph.D.  [facility] [address] [place], [place] [address] Ph: [phone] Fax: [phone]  CC:  Oswald Hillock, MD [place] [address],  [place] [address]  McLean-Scocuzza, Nino Glow, MD

## 2020-03-25 ENCOUNTER — Other Ambulatory Visit: Payer: Self-pay

## 2020-03-26 ENCOUNTER — Ambulatory Visit (INDEPENDENT_AMBULATORY_CARE_PROVIDER_SITE_OTHER): Payer: Medicare Other | Admitting: Internal Medicine

## 2020-03-26 ENCOUNTER — Encounter: Payer: Self-pay | Admitting: Internal Medicine

## 2020-03-26 ENCOUNTER — Other Ambulatory Visit: Payer: Self-pay

## 2020-03-26 VITALS — BP 116/74 | HR 76 | Temp 98.0°F | Ht 69.0 in | Wt 209.4 lb

## 2020-03-26 DIAGNOSIS — Z23 Encounter for immunization: Secondary | ICD-10-CM

## 2020-03-26 DIAGNOSIS — M5412 Radiculopathy, cervical region: Secondary | ICD-10-CM | POA: Insufficient documentation

## 2020-03-26 DIAGNOSIS — I152 Hypertension secondary to endocrine disorders: Secondary | ICD-10-CM

## 2020-03-26 DIAGNOSIS — E1159 Type 2 diabetes mellitus with other circulatory complications: Secondary | ICD-10-CM

## 2020-03-26 DIAGNOSIS — Z20822 Contact with and (suspected) exposure to covid-19: Secondary | ICD-10-CM

## 2020-03-26 DIAGNOSIS — Z1329 Encounter for screening for other suspected endocrine disorder: Secondary | ICD-10-CM | POA: Diagnosis not present

## 2020-03-26 DIAGNOSIS — N401 Enlarged prostate with lower urinary tract symptoms: Secondary | ICD-10-CM | POA: Diagnosis not present

## 2020-03-26 DIAGNOSIS — Z125 Encounter for screening for malignant neoplasm of prostate: Secondary | ICD-10-CM

## 2020-03-26 DIAGNOSIS — R001 Bradycardia, unspecified: Secondary | ICD-10-CM

## 2020-03-26 HISTORY — DX: Radiculopathy, cervical region: M54.12

## 2020-03-26 MED ORDER — TETANUS-DIPHTH-ACELL PERTUSSIS 5-2.5-18.5 LF-MCG/0.5 IM SUSP: 0.5000 mL | Freq: Once | INTRAMUSCULAR | 0 refills | Status: AC

## 2020-03-26 NOTE — Progress Notes (Signed)
Chief Complaint  Patient presents with  . Follow-up  . Immunizations    F/u  1. HTN controlled on norvasc 5 mg qd, toprol 25 mg qd, altace 10 mg qd crestor 40 mg qhs  He is having bradycardia 40s-50s qhs since 12/2019 during the day 64 bpm   2. Abnormal cervical neck MRI he is having right arm weakness at times but at baseline overall   MRI 01/19/20 cervical   IMPRESSION: Straightening of the normal cervical lordosis.  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   Electronically Signed   By: Prudencio Pair M.D.   On: 01/19/2020 20:13   Review of Systems  Constitutional: Negative for weight loss.  HENT: Negative for hearing loss.   Eyes: Negative for blurred vision.  Respiratory: Negative for shortness of breath.   Cardiovascular: Negative for chest pain.  Gastrointestinal: Negative for abdominal pain.  Musculoskeletal: Negative for falls.  Skin: Negative for rash.  Neurological: Negative for headaches.  Psychiatric/Behavioral: Negative for depression and memory loss.   Past Medical History:  Diagnosis Date  . Acute cholecystitis 04/18/2013  . Adenomatous colon polyp 12/2004  . Anemia   . Blood transfusion without reported diagnosis   . BPH associated with nocturia   . CAD (coronary artery disease)    a. s/p CABG x4 in 2000 with LIMA-LAD, reverse SVG-IM, reverse SVG-acute margin and reverse SVG-RCA b. s/p BMS to SVG-RI in 2012 c. 12/2016: PCI/DES to SVG-RCA and PCI/DES to SVG-RI (Dr. [name])   . Carotid artery stenosis    mild   . Cataract   . Clotting disorder ([place])   . Diabetes mellitus without complication ([place]) 09/81/1914   recent A1C 6.1 12/25/16 controlled with diet and exercise   . Diverticulitis 2008/2009  . GERD (gastroesophageal  reflux disease)    controlled as of 04/01/17   . HSV infection   . HTN (hypertension)   . Hx of dysplastic nevus 05/04/2018   upper back spinal   . Hyperglycemia   . Hyperlipidemia   . Impingement syndrome of left shoulder 2016  . Low testosterone    Dr [name], [place] ,[place]  . Myocardial infarction ([place])   . Osteoarthritis of right knee    With trace effusion  . Peyronie disease   . RLS (restless legs syndrome) 08/15/2013  . Rotator cuff injury    right; at 58-33 years old.   . Sinus bradycardia   . Thrombocytopenia ([place])   . Vitamin D deficiency    Past Surgical History:  Procedure Laterality Date  . CHOLECYSTECTOMY N/A 04/21/2013   Procedure: LAPAROSCOPIC CHOLECYSTECTOMY WITH INTRAOPERATIVE CHOLANGIOGRAM;  Surgeon: Haywood Lasso, MD;  Location: [facility];  Service: General;  Laterality: N/A;  . CHOLECYSTECTOMY    . COLONOSCOPY W/ POLYPECTOMY  2004 & 2009    X 2; Dr Fuller Plan; due 2014  . CORONARY ANGIOPLASTY    . CORONARY ARTERY BYPASS GRAFT  04/1999   4 vessels  . CORONARY STENT INTERVENTION N/A 12/28/2016   Procedure: CORONARY STENT INTERVENTION;  Surgeon: Lorretta Harp, MD;  Location: [facility];  Service: Cardiovascular;  Laterality: N/A;  . CORONARY STENT PLACEMENT  2012   Plavix  . LAPAROSCOPIC CHOLECYSTECTOMY  04/21/2013   Dr Margot Chimes; acutecholecystitis with necrosis  . LEFT HEART CATH AND CORS/GRAFTS  ANGIOGRAPHY N/A 12/28/2016   Procedure: LEFT HEART CATH AND CORS/GRAFTS ANGIOGRAPHY;  Surgeon: Runell Gess, MD;  [facility];  Service: Cardiovascular;  Laterality: N/A;  . LEFT HEART CATH AND CORS/GRAFTS ANGIOGRAPHY N/A 12/01/2019   Procedure: LEFT HEART CATH AND CORS/GRAFTS ANGIOGRAPHY;  Surgeon: Swaziland, Peter M, MD;  Location: [facility];  Service: Cardiovascular;  Laterality: N/A;  . VASECTOMY     Family History  Problem Relation Age of Onset  . Aortic aneurysm Father 60       ? abdominal  . Heart attack Father 55  .  Diabetes Other        PGaunt  . Coronary artery disease Mother        CABG in 25s  . CVA Mother        cns bleed from warfarin  . Aneurysm Mother   . Diabetes Brother   . Cholecystitis Sister   . Cancer Neg Hx   . Colon cancer Neg Hx   . Prostate cancer Neg Hx   . Esophageal cancer Neg Hx   . Stomach cancer Neg Hx   . Rectal cancer Neg Hx    Social History   Socioeconomic History  . Marital status: Married    Spouse name: Not on file  . Number of children: 1  . Years of education: Not on file  . Highest education level: Not on file  Occupational History  . Occupation:      Comment: plumber  Tobacco Use  . Smoking status: Never Smoker  . Smokeless tobacco: Never Used  Vaping Use  . Vaping Use: Never used  Substance and Sexual Activity  . Alcohol use: Yes    Alcohol/week: 10.0 - 12.0 standard drinks    Types: 10 - 12 Cans of beer per week    Comment: drinks beer daily; whiskey rarely, used to be daily 1-2 shots  . Drug use: No  . Sexual activity: Yes    Birth control/protection: None  Other Topics Concern  . Not on file  Social History Narrative   Remarried 2014   1 son age 68 as of 03/2017       Lives at home with spouse   Right handed   Caffeine: 1-2 cups/day (coffee, green tea)   Social Determinants of Health   Financial Resource Strain:   . Difficulty of Paying Living Expenses: Not on file  Food Insecurity:   . Worried About Programme researcher, broadcasting/film/video in the Last Year: Not on file  . Ran Out of Food in the Last Year: Not on file  Transportation Needs:   . Lack of Transportation (Medical): Not on file  . Lack of Transportation (Non-Medical): Not on file  Physical Activity:   . Days of Exercise per Week: Not on file  . Minutes of Exercise per Session: Not on file  Stress:   . Feeling of Stress : Not on file  Social Connections: Unknown  . Frequency of Communication with Friends and Family: Not on file  . Frequency of Social Gatherings with Friends and  Family: Not on file  . Attends Religious Services: Not on file  . Active Member of Clubs or Organizations: Not on file  . Attends Banker Meetings: Not on file  . Marital Status: Married  Catering manager Violence:   . Fear of Current or Ex-Partner: Not on file  . Emotionally Abused: Not on file  . Physically Abused: Not on file  .  Sexually Abused: Not on file   Current Meds  Medication Sig  . amLODipine (NORVASC) 5 MG tablet TAKE ONE (1) TABLET EACH DAY (Patient taking differently: Take 5 mg by mouth daily at 2 am. )  . Ascorbic Acid (VITAMIN C) 1000 MG tablet Take 1,000 mg by mouth daily at 2 am. Reported on 09/26/2015  . aspirin EC 81 MG tablet Take 81 mg by mouth daily at 2 am. Swallow whole.  . cholecalciferol (VITAMIN D) 25 MCG (1000 UNIT) tablet Take 1,000 Units by mouth daily at 2 am.  . clopidogrel (PLAVIX) 75 MG tablet Take 1 tablet (75 mg total) by mouth daily. (Patient taking differently: Take 75 mg by mouth daily at 2 am. )  . Coenzyme Q10 (CO Q-10 PO) Take 1 capsule by mouth daily at 2 am.  . ezetimibe (ZETIA) 10 MG tablet Take 1 tablet (10 mg total) by mouth daily. (Patient taking differently: Take 10 mg by mouth daily at 2 am. )  . GINKGO BILOBA PO Take 1 capsule by mouth daily at 2 am.   . isosorbide mononitrate (IMDUR) 60 MG 24 hr tablet Take 1 tablet (60 mg total) by mouth daily.  Marland Kitchen MAGNESIUM PO Take 1 tablet by mouth daily at 2 am.  . metoprolol succinate (TOPROL XL) 25 MG 24 hr tablet Take 1 tablet (25 mg total) by mouth daily. (Patient taking differently: Take 25 mg by mouth daily at 2 am. )  . Multiple Vitamin (MULTIVITAMIN WITH MINERALS) TABS tablet Take 1 tablet by mouth daily at 2 am.  . naproxen sodium (ALEVE) 220 MG tablet Take 220 mg by mouth 2 (two) times daily as needed (back pain.).  Marland Kitchen niacin 250 MG tablet Take 250 mg by mouth daily.  . nitroGLYCERIN (NITROSTAT) 0.4 MG SL tablet Place 1 tablet (0.4 mg total) under the tongue every 5 (five)  minutes x 3 doses as needed. (Patient taking differently: Place 0.4 mg under the tongue every 5 (five) minutes x 3 doses as needed for chest pain. )  . Nutritional Supplements (BOOST GLUCOSE CONTROL PO) Take 1 capsule by mouth daily.  . Omega-3 Fatty Acids (FISH OIL) 1000 MG CAPS Take 2,000 mg by mouth daily at 2 am.   . pantoprazole (PROTONIX) 40 MG tablet Take 1 tablet (40 mg total) by mouth 2 (two) times daily before a meal. 30 minutes. Try to reduce to 1x per day now or in 3 months (Patient taking differently: Take 40 mg by mouth daily at 2 am. 30 minutes. Try to reduce to 1x per day now or in 3 months)  . ramipril (ALTACE) 10 MG capsule Take 1 capsule (10 mg total) by mouth daily. (Patient taking differently: Take 10 mg by mouth daily at 2 am. )  . rosuvastatin (CRESTOR) 40 MG tablet Take 1 tablet (40 mg total) by mouth at bedtime. (Patient taking differently: Take 40 mg by mouth daily at 2 am. )  . tamsulosin (FLOMAX) 0.4 MG CAPS capsule Take 0.4 mg by mouth daily at 2 am.   . vitamin E 100 UNIT capsule Take 100 Units by mouth daily at 2 am.    Current Facility-Administered Medications for the 03/26/20 encounter (Office Visit) with McLean-Scocuzza, Nino Glow, MD  Medication  . sodium chloride flush (NS) 0.9 % injection 3 mL   Allergies  Allergen Reactions  . Darvocet [Propoxyphene N-Acetaminophen] Hives  . Propoxyphene Hives   Recent Results (from the past 2160 hour(s))  Ethanol  Status: None   Collection Time: 01/19/20  2:55 PM  Result Value Ref Range   Alcohol, Ethyl (B) <10 <10 mg/dL    Comment: (NOTE) Lowest detectable limit for serum alcohol is 10 mg/dL.  For medical purposes only. Performed at [facility], [place] [address], [place], [place] [address]   Protime-INR     Status: None   Collection Time: 01/19/20  2:55 PM  Result Value Ref Range   Prothrombin Time 13.4 11.4 - 15.2 seconds   INR 1.1 0.8 - 1.2    Comment: (NOTE) INR goal varies based on device and  disease states. Performed at [facility], [place] [address], [place], [place] [address]   APTT     Status: None   Collection Time: 01/19/20  2:55 PM  Result Value Ref Range   aPTT 27 24 - 36 seconds    Comment: Performed at [facility] [address] [place] [address]  CBC     Status: None   Collection Time: 01/19/20  2:55 PM  Result Value Ref Range   WBC 5.8 4.0 - 10.5 K/uL   RBC 4.47 4.22 - 5.81 MIL/uL   Hemoglobin 14.1 13.0 - 17.0 g/dL   HCT 42.2 39 - 52 %   MCV 94.4 80.0 - 100.0 fL   MCH 31.5 26.0 - 34.0 pg   MCHC 33.4 30.0 - 36.0 g/dL   RDW 12.6 11.5 - 15.5 %   Platelets 162 150 - 400 K/uL   nRBC 0.0 0.0 - 0.2 %    Comment: Performed at [facility], [place] [address], [place], [place] [address]  Differential     Status: None   Collection Time: 01/19/20  2:55 PM  Result Value Ref Range   Neutrophils Relative % 77 %   Neutro Abs 4.5 1.7 - 7.7 K/uL   Lymphocytes Relative 15 %   Lymphs Abs 0.9 0.7 - 4.0 K/uL   Monocytes Relative 7 %   Monocytes Absolute 0.4 0.1 - 1.0 K/uL   Eosinophils Relative 0 %   Eosinophils Absolute 0.0 0.0 - 0.5 K/uL   Basophils Relative 0 %   Basophils Absolute 0.0 0.0 - 0.1 K/uL   Immature Granulocytes 1 %   Abs Immature Granulocytes 0.03 0.00 - 0.07 K/uL    Comment: Performed at [facility] [address], [place], [place] [address]  Comprehensive metabolic panel     Status: Abnormal   Collection Time: 01/19/20  2:55 PM  Result Value Ref Range   Sodium 137 135 - 145 mmol/L   Potassium 4.0 3.5 - 5.1 mmol/L   Chloride 104 98 - 111 mmol/L   CO2 23 22 - 32 mmol/L   Glucose, Bld 118 (H) 70 - 99 mg/dL    Comment: Glucose reference range applies only to samples taken after fasting for at least 8 hours.   BUN 10 8 - 23 mg/dL   Creatinine, Ser 0.89 0.61 - 1.24 mg/dL   Calcium 9.1 8.9 - 10.3 mg/dL   Total Protein 6.5 6.5 - 8.1 g/dL   Albumin 3.9 3.5 - 5.0 g/dL   AST 33 15 - 41 U/L   ALT 25 0 - 44 U/L   Alkaline  Phosphatase 39 38 - 126 U/L   Total Bilirubin 0.8 0.3 - 1.2 mg/dL   GFR calc non Af Amer >60 >60 mL/min   GFR calc Af Amer >60 >60 mL/min   Anion gap  10 5 - 15    Comment: Performed at [facility], [place] [address], [place], [place] [address]  Urine rapid drug screen (hosp performed)     Status: None   Collection Time: 01/19/20  2:55 PM  Result Value Ref Range   Opiates NONE DETECTED NONE DETECTED   Cocaine NONE DETECTED NONE DETECTED   Benzodiazepines NONE DETECTED NONE DETECTED   Amphetamines NONE DETECTED NONE DETECTED   Tetrahydrocannabinol NONE DETECTED NONE DETECTED   Barbiturates NONE DETECTED NONE DETECTED    Comment: (NOTE) DRUG SCREEN FOR MEDICAL PURPOSES ONLY.  IF CONFIRMATION IS NEEDED FOR ANY PURPOSE, NOTIFY LAB WITHIN 5 DAYS.  LOWEST DETECTABLE LIMITS FOR URINE DRUG SCREEN Drug Class                     Cutoff (ng/mL) Amphetamine and metabolites    1000 Barbiturate and metabolites    200 Benzodiazepine                 938 Tricyclics and metabolites     300 Opiates and metabolites        300 Cocaine and metabolites        300 THC                            50 Performed at [facility], [place] [address], [place], [place] [address]   Urinalysis, Routine w reflex microscopic Urine, Clean Catch     Status: None   Collection Time: 01/19/20  2:55 PM  Result Value Ref Range   Color, Urine YELLOW YELLOW   APPearance CLEAR CLEAR   Specific Gravity, Urine 1.010 1.005 - 1.030   pH 5.5 5.0 - 8.0   Glucose, UA NEGATIVE NEGATIVE mg/dL   Hgb urine dipstick NEGATIVE NEGATIVE   Bilirubin Urine NEGATIVE NEGATIVE   Ketones, ur NEGATIVE NEGATIVE mg/dL   Protein, ur NEGATIVE NEGATIVE mg/dL   Nitrite NEGATIVE NEGATIVE   Leukocytes,Ua NEGATIVE NEGATIVE    Comment: Microscopic not done on urines with negative protein, blood, leukocytes, nitrite, or glucose < 500 mg/dL. Performed at [facility], [place] [address], [place],  [address]   I-stat chem 8, ED      Status: Abnormal   Collection Time: 01/19/20  3:38 PM  Result Value Ref Range   Sodium 140 135 - 145 mmol/L   Potassium 3.9 3.5 - 5.1 mmol/L   Chloride 104 98 - 111 mmol/L   BUN 11 8 - 23 mg/dL   Creatinine, Ser 0.70 0.61 - 1.24 mg/dL   Glucose, Bld 112 (H) 70 - 99 mg/dL    Comment: Glucose reference range applies only to samples taken after fasting for at least 8 hours.   Calcium, Ion 1.18 1.15 - 1.40 mmol/L   TCO2 25 22 - 32 mmol/L   Hemoglobin 13.6 13.0 - 17.0 g/dL   HCT 40.0 39 - 52 %  SARS Coronavirus 2 by RT PCR (hospital order, performed in [facility] hospital lab) Nasopharyngeal Nasopharyngeal Swab     Status: None   Collection Time: 01/19/20  6:22 PM   Specimen: Nasopharyngeal Swab  Result Value Ref Range   SARS Coronavirus 2 NEGATIVE NEGATIVE    Comment: (NOTE) SARS-CoV-2 target nucleic acids are NOT DETECTED.  The SARS-CoV-2 RNA is generally detectable in upper and lower respiratory specimens during the acute phase of infection. The lowest concentration of SARS-CoV-2 viral copies this assay  can detect is 250 copies / mL. A negative result does not preclude SARS-CoV-2 infection and should not be used as the sole basis for treatment or other patient management decisions.  A negative result may occur with improper specimen collection / handling, submission of specimen other than nasopharyngeal swab, presence of viral mutation(s) within the areas targeted by this assay, and inadequate number of viral copies (<250 copies / mL). A negative result must be combined with clinical observations, patient history, and epidemiological information.  Fact Sheet for Patients:   StrictlyIdeas.no  Fact Sheet for Healthcare Providers: BankingDealers.co.za  This test is not yet approved or  cleared by the [place] FDA and has been authorized for detection and/or diagnosis of SARS-CoV-2 by FDA under an Emergency Use Authorization  (EUA).  This EUA will remain in effect (meaning this test can be used) for the duration of the COVID-19 declaration under Section 564(b)(1) of the Act, 21 U.S.C. section 360bbb-3(b)(1), unless the authorization is terminated or revoked sooner.  Performed at [facility], [place] [address] [place], [place] [address]   CBG monitoring, ED     Status: None   Collection Time: 01/20/20  1:46 AM  Result Value Ref Range   Glucose-Capillary 94 70 - 99 mg/dL    Comment: Glucose reference range applies only to samples taken after fasting for at least 8 hours.  Lipid panel     Status: Abnormal   Collection Time: 01/20/20  4:13 AM  Result Value Ref Range   Cholesterol 117 0 - 200 mg/dL   Triglycerides 147 <150 mg/dL   HDL 37 (L) >40 mg/dL   Total CHOL/HDL Ratio 3.2 RATIO   VLDL 29 0 - 40 mg/dL   LDL Cholesterol 51 0 - 99 mg/dL    Comment:        Total Cholesterol/HDL:CHD Risk Coronary Heart Disease Risk Table                     Men   Women  1/2 Average Risk   3.4   3.3  Average Risk       5.0   4.4  2 X Average Risk   9.6   7.1  3 X Average Risk  23.4   11.0        Use the calculated Patient Ratio above and the CHD Risk Table to determine the patient's CHD Risk.        ATP III CLASSIFICATION (LDL):  <100     mg/dL   Optimal  100-129  mg/dL   Near or Above                    Optimal  130-159  mg/dL   Borderline  160-189  mg/dL   High  >190     mg/dL   Very High Performed at [facility] [address] [place] [address]   CBC     Status: None   Collection Time: 01/20/20  4:13 AM  Result Value Ref Range   WBC 4.8 4.0 - 10.5 K/uL   RBC 4.32 4.22 - 5.81 MIL/uL   Hemoglobin 13.4 13.0 - 17.0 g/dL   HCT 40.3 39 - 52 %   MCV 93.3 80.0 - 100.0 fL   MCH 31.0 26.0 - 34.0 pg   MCHC 33.3 30.0 - 36.0 g/dL   RDW 12.7 11.5 - 15.5 %   Platelets 152 150 - 400 K/uL   nRBC  0.0 0.0 - 0.2 %    Comment: Performed at [facility], [address], [place], [place]  [address]  Basic metabolic panel     Status: Abnormal   Collection Time: 01/20/20  4:13 AM  Result Value Ref Range   Sodium 138 135 - 145 mmol/L   Potassium 3.5 3.5 - 5.1 mmol/L   Chloride 105 98 - 111 mmol/L   CO2 24 22 - 32 mmol/L   Glucose, Bld 108 (H) 70 - 99 mg/dL    Comment: Glucose reference range applies only to samples taken after fasting for at least 8 hours.   BUN 8 8 - 23 mg/dL   Creatinine, Ser 6.73 0.61 - 1.24 mg/dL   Calcium 8.9 8.9 - 05.2 mg/dL   GFR calc non Af Amer >60 >60 mL/min   GFR calc Af Amer >60 >60 mL/min   Anion gap 9 5 - 15    Comment: Performed at [facility], [address], [place], [place] [address]  Hemoglobin A1c     Status: Abnormal   Collection Time: 01/20/20  4:13 AM  Result Value Ref Range   Hgb A1c MFr Bld 5.9 (H) 4.8 - 5.6 %    Comment: (NOTE) Pre diabetes:          5.7%-6.4%  Diabetes:              >6.4%  Glycemic control for   <7.0% adults with diabetes    Mean Plasma Glucose 122.63 mg/dL    Comment: Performed at [facility], [address] [place], [place] [address]   Objective  Body mass index is 30.92 kg/m. Wt Readings from Last 3 Encounters:  03/26/20 209 lb 6.4 oz (95 kg)  02/02/20 209 lb (94.8 kg)  01/20/20 200 lb 6.4 oz (90.9 kg)   Temp Readings from Last 3 Encounters:  03/26/20 98 F (36.7 C) (Oral)  01/20/20 97.8 F (36.6 C)  12/01/19 98 F (36.7 C)   BP Readings from Last 3 Encounters:  03/26/20 116/74  02/02/20 (!) 169/83  01/20/20 135/63   Pulse Readings from Last 3 Encounters:  03/26/20 76  02/02/20 61  01/20/20 (!) 49    Physical Exam Vitals and nursing note reviewed.  Constitutional:      Appearance: Normal appearance. He is well-developed and well-groomed. He is obese.  HENT:     Head: Normocephalic and atraumatic.  Eyes:     Conjunctiva/sclera: Conjunctivae normal.     Pupils: Pupils are equal, round, and reactive to light.  Cardiovascular:     Rate and Rhythm: Normal rate and  regular rhythm.     Heart sounds: Normal heart sounds. No murmur heard.   Pulmonary:     Effort: Pulmonary effort is normal.     Breath sounds: Normal breath sounds.  Abdominal:     General: Abdomen is flat. Bowel sounds are normal.     Tenderness: There is no abdominal tenderness.  Skin:    General: Skin is warm and dry.  Neurological:     General: No focal deficit present.     Mental Status: He is alert and oriented to person, place, and time. Mental status is at baseline.     Gait: Gait normal.  Psychiatric:        Attention and Perception: Attention and perception normal.        Mood and Affect: Mood and affect normal.        Speech: Speech  normal.        Behavior: Behavior normal. Behavior is cooperative.        Thought Content: Thought content normal.        Cognition and Memory: Cognition and memory normal.        Judgment: Judgment normal.     Assessment  Plan  Cervical radiculopathy Consider NS in the future  Sinus bradycardia - Plan: TSH Hypertension associated with diabetes ([place]) - Plan: Comprehensive metabolic panel, Lipid panel, CBC with Differential/Platelet, Hemoglobin A1c Cont meds f/u Dr. [name] 04/22/20   Benign prostatic hyperplasia with lower urinary tract symptoms, symptom details unspecified - Plan: PSA, Medicare (  [facility] only) F/u Dr. Rogers Blocker  HM flu shotgiven today - utdzostavax, prevnar, pna 23,Tdap ? If had 08/07/08 -Rx tdap to pharmacy 03/26/20  RxTdap andshingrixmailed 02/15/2019 Consider covid 19 vaccine declines as of 09/22/19  Considerhep B vaccine not immune MMR immune PSA had7/16/20 1.35 normal  -Dr. Clementeen Graham  Had eye exam Dr. Kirke Corin get records Never smoker no CT chest  [place] 04/2013 abdominal aorta normal   Colonoscopy had 05/02/15 Dr. Fuller Plan sessile polyp repeat 04/2020 per report. Saw Dr. Silvio Pate 9/2019and 4/7/2020and 10/03/18 had upper endoscopy negative bx  saw derm 05/03/17 noted SK,  lentigos, rosacea, Aks face and arms f/u in 1 year [facility]. [place].   Cards Dr. [name] f/u 04/22/20   Provider: Dr. Olivia Mackie McLean-Scocuzza-Internal Medicine

## 2020-03-26 NOTE — Patient Instructions (Addendum)
Neck Exercises Ask your health care provider which exercises are safe for you. Do exercises exactly as told by your health care provider and adjust them as directed. It is normal to feel mild stretching, pulling, tightness, or discomfort as you do these exercises. Stop right away if you feel sudden pain or your pain gets worse. Do not begin these exercises until told by your health care provider. Neck exercises can be important for many reasons. They can improve strength and maintain flexibility in your neck, which will help your upper back and prevent neck pain. Stretching exercises Rotation neck stretching  1. Sit in a chair or stand up. 2. Place your feet flat on the floor, shoulder width apart. 3. Slowly turn your head (rotate) to the right until a slight stretch is felt. Turn it all the way to the right so you can look over your right shoulder. Do not tilt or tip your head. 4. Hold this position for 10-30 seconds. 5. Slowly turn your head (rotate) to the left until a slight stretch is felt. Turn it all the way to the left so you can look over your left shoulder. Do not tilt or tip your head. 6. Hold this position for 10-30 seconds. Repeat __________ times. Complete this exercise __________ times a day. Neck retraction 1. Sit in a sturdy chair or stand up. 2. Look straight ahead. Do not bend your neck. 3. Use your fingers to push your chin backward (retraction). Do not bend your neck for this movement. Continue to face straight ahead. If you are doing the exercise properly, you will feel a slight sensation in your throat and a stretch at the back of your neck. 4. Hold the stretch for 1-2 seconds. Repeat __________ times. Complete this exercise __________ times a day. Strengthening exercises Neck press 1. Lie on your back on a firm bed or on the floor with a pillow under your head. 2. Use your neck muscles to push your head down on the pillow and straighten your spine. 3. Hold the position  as well as you can. Keep your head facing up (in a neutral position) and your chin tucked. 4. Slowly count to 5 while holding this position. Repeat __________ times. Complete this exercise __________ times a day. Isometrics These are exercises in which you strengthen the muscles in your neck while keeping your neck still (isometrics). 1. Sit in a supportive chair and place your hand on your forehead. 2. Keep your head and face facing straight ahead. Do not flex or extend your neck while doing isometrics. 3. Push forward with your head and neck while pushing back with your hand. Hold for 10 seconds. 4. Do the sequence again, this time putting your hand against the back of your head. Use your head and neck to push backward against the hand pressure. 5. Finally, do the same exercise on either side of your head, pushing sideways against the pressure of your hand. Repeat __________ times. Complete this exercise __________ times a day. Prone head lifts 1. Lie face-down (prone position), resting on your elbows so that your chest and upper back are raised. 2. Start with your head facing downward, near your chest. Position your chin either on or near your chest. 3. Slowly lift your head upward. Lift until you are looking straight ahead. Then continue lifting your head as far back as you can comfortably stretch. 4. Hold your head up for 5 seconds. Then slowly lower it to your starting position. Repeat __________   times. Complete this exercise __________ times a day. Supine head lifts 1. Lie on your back (supine position), bending your knees to point to the ceiling and keeping your feet flat on the floor. 2. Lift your head slowly off the floor, raising your chin toward your chest. 3. Hold for 5 seconds. Repeat __________ times. Complete this exercise __________ times a day. Scapular retraction 1. Stand with your arms at your sides. Look straight ahead. 2. Slowly pull both shoulders (scapulae) backward  and downward (retraction) until you feel a stretch between your shoulder blades in your upper back. 3. Hold for 10-30 seconds. 4. Relax and repeat. Repeat __________ times. Complete this exercise __________ times a day. Contact a health care provider if:  Your neck pain or discomfort gets much worse when you do an exercise.  Your neck pain or discomfort does not improve within 2 hours after you exercise. If you have any of these problems, stop exercising right away. Do not do the exercises again unless your health care provider says that you can. Get help right away if:  You develop sudden, severe neck pain. If this happens, stop exercising right away. Do not do the exercises again unless your health care provider says that you can. This information is not intended to replace advice given to you by your health care provider. Make sure you discuss any questions you have with your health care provider. Document Revised: 03/02/2018 Document Reviewed: 03/02/2018 Elsevier Patient Education  [place].  Cervical Strain and Sprain Rehab Ask your health care provider which exercises are safe for you. Do exercises exactly as told by your health care provider and adjust them as directed. It is normal to feel mild stretching, pulling, tightness, or discomfort as you do these exercises. Stop right away if you feel sudden pain or your pain gets worse. Do not begin these exercises until told by your health care provider. Stretching and range-of-motion exercises Cervical side bending  1. Using good posture, sit on a stable chair or stand up. 2. Without moving your shoulders, slowly tilt your left / right ear to your shoulder until you feel a stretch in the opposite side neck muscles. You should be looking straight ahead. 3. Hold for __________ seconds. 4. Repeat with the other side of your neck. Repeat __________ times. Complete this exercise __________ times a day. Cervical  rotation  1. Using good posture, sit on a stable chair or stand up. 2. Slowly turn your head to the side as if you are looking over your left / right shoulder. ? Keep your eyes level with the ground. ? Stop when you feel a stretch along the side and the back of your neck. 3. Hold for __________ seconds. 4. Repeat this by turning to your other side. Repeat __________ times. Complete this exercise __________ times a day. Thoracic extension and pectoral stretch 1. Roll a towel or a small blanket so it is about 4 inches (10 cm) in diameter. 2. Lie down on your back on a firm surface. 3. Put the towel lengthwise, under your spine in the middle of your back. It should not be under your shoulder blades. The towel should line up with your spine from your middle back to your lower back. 4. Put your hands behind your head and let your elbows fall out to your sides. 5. Hold for __________ seconds. Repeat __________ times. Complete this exercise __________ times a day. Strengthening exercises Isometric upper cervical flexion 1. Lie on your  back with a thin pillow behind your head and a small rolled-up towel under your neck. 2. Gently tuck your chin toward your chest and nod your head down to look toward your feet. Do not lift your head off the pillow. 3. Hold for __________ seconds. 4. Release the tension slowly. Relax your neck muscles completely before you repeat this exercise. Repeat __________ times. Complete this exercise __________ times a day. Isometric cervical extension  1. Stand about 6 inches (15 cm) away from a wall, with your back facing the wall. 2. Place a soft object, about 6-8 inches (15-20 cm) in diameter, between the back of your head and the wall. A soft object could be a small pillow, a ball, or a folded towel. 3. Gently tilt your head back and press into the soft object. Keep your jaw and forehead relaxed. 4. Hold for __________ seconds. 5. Release the tension slowly. Relax  your neck muscles completely before you repeat this exercise. Repeat __________ times. Complete this exercise __________ times a day. Posture and body mechanics Body mechanics refers to the movements and positions of your body while you do your daily activities. Posture is part of body mechanics. Good posture and healthy body mechanics can help to relieve stress in your body's tissues and joints. Good posture means that your spine is in its natural S-curve position (your spine is neutral), your shoulders are pulled back slightly, and your head is not tipped forward. The following are general guidelines for applying improved posture and body mechanics to your everyday activities. Sitting  1. When sitting, keep your spine neutral and keep your feet flat on the floor. Use a footrest, if necessary, and keep your thighs parallel to the floor. Avoid rounding your shoulders, and avoid tilting your head forward. 2. When working at a desk or a computer, keep your desk at a height where your hands are slightly lower than your elbows. Slide your chair under your desk so you are close enough to maintain good posture. 3. When working at a computer, place your monitor at a height where you are looking straight ahead and you do not have to tilt your head forward or downward to look at the screen. Standing   When standing, keep your spine neutral and keep your feet about hip-width apart. Keep a slight bend in your knees. Your ears, shoulders, and hips should line up.  When you do a task in which you stand in one place for a long time, place one foot up on a stable object that is 2-4 inches (5-10 cm) high, such as a footstool. This helps keep your spine neutral. Resting When lying down and resting, avoid positions that are most painful for you. Try to support your neck in a neutral position. You can use a contour pillow or a small rolled-up towel. Your pillow should support your neck but not push on it. This  information is not intended to replace advice given to you by your health care provider. Make sure you discuss any questions you have with your health care provider. Document Revised: 08/24/2018 Document Reviewed: 02/02/2018 Elsevier Patient Education  [place].  Bradycardia, Adult Bradycardia is a slower-than-normal heartbeat. A normal resting heart rate for an adult ranges from 60 to 100 beats per minute. With bradycardia, the resting heart rate is less than 60 beats per minute. Bradycardia can prevent enough oxygen from reaching certain areas of your body when you are active. It can be serious if it  keeps enough oxygen from reaching your brain and other parts of your body. Bradycardia is not a problem for everyone. For some healthy adults, a slow resting heart rate is normal. What are the causes? This condition may be caused by:  A problem with the heart, including: ? A problem with the heart's electrical system, such as a heart block. With a heart block, electrical signals between the chambers of the heart are partially or completely blocked, so they are not able to work as they should. ? A problem with the heart's natural pacemaker (sinus node). ? Heart disease. ? A heart attack. ? Heart damage. ? Lyme disease. ? A heart infection. ? A heart condition that is present at birth (congenital heart defect).  Certain medicines that treat heart conditions.  Certain conditions, such as hypothyroidism and obstructive sleep apnea.  Problems with the balance of chemicals and other substances, like potassium, in the blood.  Trauma.  Radiation therapy. What increases the risk? You are more likely to develop this condition if you:  Are age 77 or older.  Have high blood pressure (hypertension), high cholesterol (hyperlipidemia), or diabetes.  Drink heavily, use tobacco or nicotine products, or use drugs. What are the signs or symptoms? Symptoms of this condition  include:  Light-headedness.  Feeling faint or fainting.  Fatigue and weakness.  Trouble with activity or exercise.  Shortness of breath.  Chest pain (angina).  Drowsiness.  Confusion.  Dizziness. How is this diagnosed? This condition may be diagnosed based on:  Your symptoms.  Your medical history.  A physical exam. During the exam, your health care provider will listen to your heartbeat and check your pulse. To confirm the diagnosis, your health care provider may order tests, such as:  Blood tests.  An electrocardiogram (ECG). This test records the heart's electrical activity. The test can show how fast your heart is beating and whether the heartbeat is steady.  A test in which you wear a portable device (event recorder or Holter monitor) to record your heart's electrical activity while you go about your day.  Anexercise test. How is this treated? Treatment for this condition depends on the cause of the condition and how severe your symptoms are. Treatment may involve:  Treatment of the underlying condition.  Changing your medicines or how much medicine you take.  Having a small, battery-operated device called a pacemaker implanted under the skin. When bradycardia occurs, this device can be used to increase your heart rate and help your heart beat in a regular rhythm. Follow these instructions at home: Lifestyle   Manage any health conditions that contribute to bradycardia as told by your health care provider.  Follow a heart-healthy diet. A nutrition specialist (dietitian) can help educate you about healthy food options and changes.  Follow an exercise program that is approved by your health care provider.  Maintain a healthy weight.  Try to reduce or manage your stress, such as with yoga or meditation. If you need help reducing stress, ask your health care provider.  Do not use any products that contain nicotine or tobacco, such as cigarettes,  e-cigarettes, and chewing tobacco. If you need help quitting, ask your health care provider.  Do not use illegal drugs.  Limit alcohol intake to no more than 1 drink a day for nonpregnant women and 2 drinks a day for men. Be aware of how much alcohol is in your drink. In the [place] one drink equals one 12 oz bottle of beer (355  mL), one 5 oz glass of wine (148 mL), or one 1 oz glass of hard liquor (44 mL). General instructions  Take over-the-counter and prescription medicines only as told by your health care provider.  Keep all follow-up visits as told by your health care provider. This is important. How is this prevented? In some cases, bradycardia may be prevented by:  Treating underlying medical problems.  Stopping behaviors or medicines that can trigger the condition. Contact a health care provider if you:  Feel light-headed or dizzy.  Almost faint.  Feel weak or are easily fatigued during physical activity.  Experience confusion or have memory problems. Get help right away if:  You faint.  You have: ? An irregular heartbeat (palpitations). ? Chest pain. ? Trouble breathing. Summary  Bradycardia is a slower-than-normal heartbeat. With bradycardia, the resting heart rate is less than 60 beats per minute.  Treatment for this condition depends on the cause.  Manage any health conditions that contribute to bradycardia as told by your health care provider.  Do not use any products that contain nicotine or tobacco, such as cigarettes, e-cigarettes, and chewing tobacco, and limit alcohol intake.  Keep all follow-up visits as told by your health care provider. This is important. This information is not intended to replace advice given to you by your health care provider. Make sure you discuss any questions you have with your health care provider. Document Revised: 11/15/2017 Document Reviewed: 10/13/2017 Elsevier Patient Education  [place].  Cervical  Radiculopathy  Cervical radiculopathy happens when a nerve in the neck (a cervical nerve) is pinched or bruised. This condition can happen because of an injury to the cervical spine (vertebrae) in the neck, or as part of the normal aging process. Pressure on the cervical nerves can cause pain or numbness that travels from the neck all the way down into the arm and fingers. Usually, this condition gets better with rest. Treatment may be needed if the condition does not improve. What are the causes? This condition may be caused by:  A neck injury.  A bulging (herniated) disk.  Muscle spasms.  Muscle tightness in the neck because of overuse.  Arthritis.  Breakdown or degeneration in the bones and joints of the spine (spondylosis) due to aging.  Bone spurs that may develop near the cervical nerves. What are the signs or symptoms? Symptoms of this condition include:  Pain. The pain may travel from the neck to the arm and hand. The pain can be severe or irritating. It may be worse when you move your neck.  Numbness or tingling in your arm or hand.  Weakness in the affected arm and hand, in severe cases. How is this diagnosed? This condition may be diagnosed based on your symptoms, your medical history, and a physical exam. You may also have tests, including:  X-rays.  A CT scan.  An MRI.  An electromyogram (EMG).  Nerve conduction tests. How is this treated? In many cases, treatment is not needed for this condition. With rest, the condition usually gets better over time. If treatment is needed, options may include:  Wearing a soft neck collar (cervical collar) for short periods of time, as told by your health care provider.  Doing physical therapy to strengthen your neck muscles.  Taking medicines, such as NSAIDs or oral corticosteroids.  Having spinal injections, in severe cases.  Having surgery. This may be needed if other treatments do not help. Different types of  surgery may be done  depending on the cause of this condition. Follow these instructions at home: If you have a cervical collar:  Wear it as told by your health care provider. Remove it only as told by your health care provider.  Ask your health care provider if you can remove the collar for cleaning and bathing. If you are allowed to remove the collar for cleaning or bathing: ? Follow instructions from your health care provider about how to remove the collar safely. ? Clean the collar by wiping it with mild soap and water and drying it completely. ? Take out any removable pads in the collar every 1-2 days, and wash them by hand with soap and water. Let them air-dry completely before you put them back in the collar. ? Check your skin under the collar for irritation or sores. If you see any, tell your health care provider. Managing pain      Take over-the-counter and prescription medicines only as told by your health care provider.  If directed, put ice on the affected area. ? If you have a soft neck collar, remove it as told by your health care provider. ? Put ice in a plastic bag. ? Place a towel between your skin and the bag. ? Leave the ice on for 20 minutes, 2-3 times a day.  If applying ice does not help, you can try using heat. Use the heat source that your health care provider recommends, such as a moist heat pack or a heating pad. ? Place a towel between your skin and the heat source. ? Leave the heat on for 20-30 minutes. ? Remove the heat if your skin turns bright red. This is especially important if you are unable to feel pain, heat, or cold. You may have a greater risk of getting burned.  Try a gentle neck and shoulder massage to help relieve symptoms. Activity  Rest as needed.  Return to your normal activities as told by your health care provider. Ask your health care provider what activities are safe for you.  Do stretching and strengthening exercises as told by your  health care provider or physical therapist.  Do not lift anything that is heavier than 10 lb (4.5 kg) until your health care provider tells you that it is safe. General instructions  Use a flat pillow when you sleep.  Do not drive while wearing a cervical collar. If you do not have a cervical collar, ask your health care provider if it is safe to drive while your neck heals.  Ask your health care provider if the medicine prescribed to you requires you to avoid driving or using heavy machinery.  Do not use any products that contain nicotine or tobacco, such as cigarettes, e-cigarettes, and chewing tobacco. These can delay healing. If you need help quitting, ask your health care provider.  Keep all follow-up visits as told by your health care provider. This is important. Contact a health care provider if:  Your condition does not improve with treatment. Get help right away if:  Your pain gets much worse and cannot be controlled with medicines.  You have weakness or numbness in your hand, arm, face, or leg.  You have a high fever.  You have a stiff, rigid neck.  You lose control of your bowels or your bladder (have incontinence).  You have trouble with walking, balance, or speaking. Summary  Cervical radiculopathy happens when a nerve in the neck is pinched or bruised.  A nerve can get pinched  from a bulging disk, arthritis, muscle spasms, or an injury to the neck.  Symptoms include pain, tingling, or numbness radiating from the neck into the arm or hand. Weakness can also occur in severe cases.  Treatment may include rest, wearing a cervical collar, and physical therapy. Medicines may be prescribed to help with pain. In severe cases, injections or surgery may be needed. This information is not intended to replace advice given to you by your health care provider. Make sure you discuss any questions you have with your health care provider. Document Revised: 03/25/2018 Document  Reviewed: 03/25/2018 Elsevier Patient Education  2020 [employer].

## 2020-03-29 ENCOUNTER — Ambulatory Visit: Payer: Medicare Other | Admitting: Neurology

## 2020-04-21 DIAGNOSIS — R42 Dizziness and giddiness: Secondary | ICD-10-CM

## 2020-04-21 HISTORY — DX: Dizziness and giddiness: R42

## 2020-04-21 NOTE — Progress Notes (Signed)
Cardiology Office Note   Date:  04/22/2020   ID:  Kevin Webb, DOB Jun 16, 1945, MRN [id number]  PCP:  McLean-Scocuzza, Nino Glow, MD  Cardiologist:   Minus Breeding, MD   Chief Complaint  Patient presents with  . Arm Tingling      History of Present Illness: Kevin Webb is a 74 y.o. male who presents for evaluation of CAD.   He last had a cath in July of this year with results as below.  He was in the hospital in July with chest pain and had a cardiac cath.  Since I last saw him he has done well.  He walks for exercise 5 to 10,000 feet a day.  He denies any cardiovascular symptoms.  He has not been getting any chest pressure, neck or arm discomfort.  He has no new shortness of breath, PND or orthopnea.  He has no palpitations, presyncope or syncope.  He is not having the dizziness that he had previously.  Past Medical History:  Diagnosis Date  . Acute cholecystitis 04/18/2013  . Adenomatous colon polyp 12/2004  . Anemia   . Blood transfusion without reported diagnosis   . BPH associated with nocturia   . CAD (coronary artery disease)    a. s/p CABG x4 in 2000 with LIMA-LAD, reverse SVG-IM, reverse SVG-acute margin and reverse SVG-RCA b. s/p BMS to SVG-RI in 2012 c. 12/2016: PCI/DES to SVG-RCA and PCI/DES to SVG-RI (Dr. [name])   . Carotid artery stenosis    mild   . Cataract   . Clotting disorder ([place])   . Diabetes mellitus without complication ([place]) 56/38/9373   recent A1C 6.1 12/25/16 controlled with diet and exercise   . Diverticulitis 2008/2009  . GERD (gastroesophageal reflux disease)    controlled as of 04/01/17   . HSV infection   . HTN (hypertension)   . Hx of dysplastic nevus 05/04/2018   upper back spinal   . Hyperglycemia   . Hyperlipidemia   . Impingement syndrome of left shoulder 2016  . Low testosterone    Dr [name], [place] ,[place]  . Myocardial infarction ([place])   . Osteoarthritis of right knee    With trace effusion  . Peyronie disease   . RLS  (restless legs syndrome) 08/15/2013  . Rotator cuff injury    right; at 25-65 years old.   . Sinus bradycardia   . Thrombocytopenia ([place])   . Vitamin D deficiency     Past Surgical History:  Procedure Laterality Date  . CHOLECYSTECTOMY N/A 04/21/2013   Procedure: LAPAROSCOPIC CHOLECYSTECTOMY WITH INTRAOPERATIVE CHOLANGIOGRAM;  Surgeon: Haywood Lasso, MD;  Location: [facility];  Service: General;  Laterality: N/A;  . CHOLECYSTECTOMY    . COLONOSCOPY W/ POLYPECTOMY  2004 & 2009    X 2; Dr Fuller Plan; due 2014  . CORONARY ANGIOPLASTY    . CORONARY ARTERY BYPASS GRAFT  04/1999   4 vessels  . CORONARY STENT INTERVENTION N/A 12/28/2016   Procedure: CORONARY STENT INTERVENTION;  Surgeon: Lorretta Harp, MD;  Location: [facility];  Service: Cardiovascular;  Laterality: N/A;  . CORONARY STENT PLACEMENT  2012   Plavix  . LAPAROSCOPIC CHOLECYSTECTOMY  04/21/2013   Dr Margot Chimes; acutecholecystitis with necrosis  . LEFT HEART CATH AND CORS/GRAFTS ANGIOGRAPHY N/A 12/28/2016   Procedure: LEFT HEART CATH AND CORS/GRAFTS ANGIOGRAPHY;  Surgeon: Lorretta Harp, MD;  Location: [facility];  Service: Cardiovascular;  Laterality: N/A;  . LEFT HEART CATH AND CORS/GRAFTS  ANGIOGRAPHY N/A 12/01/2019   Procedure: LEFT HEART CATH AND CORS/GRAFTS ANGIOGRAPHY;  Surgeon: Martinique, Peter M, MD;  Location: [facility];  Service: Cardiovascular;  Laterality: N/A;  . VASECTOMY       Current Outpatient Medications  Medication Sig Dispense Refill  . amLODipine (NORVASC) 5 MG tablet TAKE ONE (1) TABLET EACH DAY (Patient taking differently: Take 5 mg by mouth daily at 2 am. ) 90 tablet 3  . Ascorbic Acid (VITAMIN C) 1000 MG tablet Take 1,000 mg by mouth daily at 2 am. Reported on 09/26/2015    . aspirin EC 81 MG tablet Take 81 mg by mouth daily at 2 am. Swallow whole.    . cholecalciferol (VITAMIN D) 25 MCG (1000 UNIT) tablet Take 1,000 Units by mouth daily at 2 am.    . clopidogrel (PLAVIX) 75 MG tablet  Take 1 tablet (75 mg total) by mouth daily. (Patient taking differently: Take 75 mg by mouth daily at 2 am. ) 90 tablet 3  . Coenzyme Q10 (CO Q-10 PO) Take 1 capsule by mouth daily at 2 am.    . ezetimibe (ZETIA) 10 MG tablet Take 1 tablet (10 mg total) by mouth daily. (Patient taking differently: Take 10 mg by mouth daily at 2 am. ) 90 tablet 3  . GINKGO BILOBA PO Take 1 capsule by mouth daily at 2 am.     . isosorbide mononitrate (IMDUR) 60 MG 24 hr tablet Take 1 tablet (60 mg total) by mouth daily. 90 tablet 3  . MAGNESIUM PO Take 1 tablet by mouth daily at 2 am.    . metoprolol succinate (TOPROL XL) 25 MG 24 hr tablet Take 1 tablet (25 mg total) by mouth daily. (Patient taking differently: Take 25 mg by mouth daily at 2 am. ) 90 tablet 3  . Multiple Vitamin (MULTIVITAMIN WITH MINERALS) TABS tablet Take 1 tablet by mouth daily at 2 am.    . naproxen sodium (ALEVE) 220 MG tablet Take 220 mg by mouth 2 (two) times daily as needed (back pain.).    Marland Kitchen niacin 250 MG tablet Take 250 mg by mouth daily.    . nitroGLYCERIN (NITROSTAT) 0.4 MG SL tablet Place 1 tablet (0.4 mg total) under the tongue every 5 (five) minutes x 3 doses as needed. (Patient taking differently: Place 0.4 mg under the tongue every 5 (five) minutes x 3 doses as needed for chest pain. ) 25 tablet 4  . Nutritional Supplements (BOOST GLUCOSE CONTROL PO) Take 1 capsule by mouth daily.    . Omega-3 Fatty Acids (FISH OIL) 1000 MG CAPS Take 2,000 mg by mouth daily at 2 am.     . pantoprazole (PROTONIX) 40 MG tablet Take 1 tablet (40 mg total) by mouth 2 (two) times daily before a meal. 30 minutes. Try to reduce to 1x per day now or in 3 months (Patient taking differently: Take 40 mg by mouth daily at 2 am. 30 minutes. Try to reduce to 1x per day now or in 3 months) 180 tablet 3  . ramipril (ALTACE) 10 MG capsule Take 1 capsule (10 mg total) by mouth daily. (Patient taking differently: Take 10 mg by mouth daily at 2 am. ) 90 capsule 3  .  rosuvastatin (CRESTOR) 40 MG tablet Take 1 tablet (40 mg total) by mouth at bedtime. (Patient taking differently: Take 40 mg by mouth daily at 2 am. ) 90 tablet 3  . tamsulosin (FLOMAX) 0.4 MG CAPS capsule Take 0.4  mg by mouth daily at 2 am.     . vitamin E 100 UNIT capsule Take 100 Units by mouth daily at 2 am.      Current Facility-Administered Medications  Medication Dose Route Frequency Provider Last Rate Last Admin  . sodium chloride flush (NS) 0.9 % injection 3 mL  3 mL Intravenous Q12H Almyra Deforest, PA        Allergies:   Darvocet [propoxyphene n-acetaminophen] and Propoxyphene    ROS:  Please see the history of present illness.   Otherwise, review of systems are positive for none.   All other systems are reviewed and negative.    PHYSICAL EXAM: VS:  BP 140/82   Pulse 81   Ht 5\' 9"  (1.753 m)   Wt 209 lb (94.8 kg)   SpO2 98%   BMI 30.86 kg/m  , BMI Body mass index is 30.86 kg/m. GENERAL:  Well appearing NECK:  No jugular venous distention, waveform within normal limits, carotid upstroke brisk and symmetric, no bruits, no thyromegaly LUNGS:  Clear to auscultation bilaterally CHEST:  Well healed sternotomy scar. HEART:  PMI not displaced or sustained,S1 and S2 within normal limits, no S3, no S4, no clicks, no rubs, no murmurs ABD:  Flat, positive bowel sounds normal in frequency in pitch, no bruits, no rebound, no guarding, no midline pulsatile mass, no hepatomegaly, no splenomegaly EXT:  2 plus pulses throughout, no edema, no cyanosis no clubbing   EKG:  EKG not ordered today.   Recent Labs: 07/18/2019: TSH 1.40 01/19/2020: ALT 25 01/20/2020: BUN 8; Creatinine, Ser 0.86; Hemoglobin 13.4; Platelets 152; Potassium 3.5; Sodium 138   Lab Results  Component Value Date   HGBA1C 5.9 (H) 01/20/2020     Lipid Panel    Component Value Date/Time   CHOL 117 01/20/2020 0413   TRIG 147 01/20/2020 0413   HDL 37 (L) 01/20/2020 0413   CHOLHDL 3.2 01/20/2020 0413   VLDL 29 01/20/2020  0413   LDLCALC 51 01/20/2020 0413   LDLDIRECT 74.0 10/24/2019 0912      Wt Readings from Last 3 Encounters:  04/22/20 209 lb (94.8 kg)  03/26/20 209 lb 6.4 oz (95 kg)  02/02/20 209 lb (94.8 kg)     Cath   12/01/19    Ost LAD to Prox LAD lesion is 100% stenosed.  1st Diag lesion is 60% stenosed.  Ramus lesion is 100% stenosed.  Prox Cx lesion is 40% stenosed.  Prox RCA lesion is 100% stenosed.  Mid RCA lesion is 100% stenosed.  Prox Graft lesion is 100% stenosed.  Prox Graft lesion is 100% stenosed.  Origin to Prox Graft lesion is 100% stenosed.  The left ventricular systolic function is normal.  LV end diastolic pressure is normal.  The left ventricular ejection fraction is 55-65% by visual estimate.   1. Severe 3 vessel occlusive CAD involving the ostial LAD, ostial ramus intermediate and proximal RCA. 2. Occluded SVG to PDA 3. Occluded SVG to ramus intermediate 4. Patent LIMA to the LAD.  5. Normal LV function 6. Normal LVEDP   Other studies Reviewed: Additional studies/ records that were reviewed today include: Labs Review of the above records demonstrates:  Please see elsewhere in the note.     ASSESSMENT AND PLAN:   CAD/CABG-  Patient has no new symptoms.  We talked about the fact that he seems to be more compliant with risk factor modification.  Not having any further chest discomfort.  No change in therapy.  I reviewed again with him in the office his cath report.   DIZZINESS:  He is no longer having this.  No change in therapy.  HYPERLIPIDEMIA -  Goal LDL is 56.  No change in therapy.   HYPERTENSION -  Blood pressures is at target.  No change in therapy.   DM  A1c was 5.9.  No change in therapy.m to follow through with very tight blood sugar control.   COVID EDUCATION:    He has not wanted to have the vaccine we talked about this again today.  Current medicines are reviewed at length with the patient today.  The patient does not have  concerns regarding medicines.  The following changes have been made:   None  Labs/ tests ordered today include:    None  No orders of the defined types were placed in this encounter.    Disposition:   FU with me in 12 months.   Signed, Minus Breeding, MD  04/22/2020 11:20 AM    [place]

## 2020-04-22 ENCOUNTER — Ambulatory Visit (INDEPENDENT_AMBULATORY_CARE_PROVIDER_SITE_OTHER): Payer: Medicare Other | Admitting: Cardiology

## 2020-04-22 ENCOUNTER — Other Ambulatory Visit: Payer: Self-pay

## 2020-04-22 ENCOUNTER — Encounter: Payer: Self-pay | Admitting: Cardiology

## 2020-04-22 VITALS — BP 140/82 | HR 81 | Ht 69.0 in | Wt 209.0 lb

## 2020-04-22 DIAGNOSIS — I6522 Occlusion and stenosis of left carotid artery: Secondary | ICD-10-CM | POA: Diagnosis not present

## 2020-04-22 DIAGNOSIS — I25709 Atherosclerosis of coronary artery bypass graft(s), unspecified, with unspecified angina pectoris: Secondary | ICD-10-CM | POA: Diagnosis not present

## 2020-04-22 DIAGNOSIS — E785 Hyperlipidemia, unspecified: Secondary | ICD-10-CM

## 2020-04-22 DIAGNOSIS — E118 Type 2 diabetes mellitus with unspecified complications: Secondary | ICD-10-CM

## 2020-04-22 DIAGNOSIS — I10 Essential (primary) hypertension: Secondary | ICD-10-CM | POA: Diagnosis not present

## 2020-04-22 DIAGNOSIS — R42 Dizziness and giddiness: Secondary | ICD-10-CM | POA: Diagnosis not present

## 2020-04-22 NOTE — Patient Instructions (Signed)

## 2020-04-26 ENCOUNTER — Telehealth: Payer: Self-pay

## 2020-04-26 NOTE — Telephone Encounter (Signed)
Hi, I agree that he needs to be evaluated in the office. His last contact with our office was in May 2020 for a different problem. We are happy to see him and he can call for an appt when we open on Monday. MS

## 2020-04-26 NOTE — Telephone Encounter (Signed)
Pt states that he has diverticulitis and needs Dr. Olivia Mackie to send him in Cipro/Flagyl. He said he didn't need an appointment and would like a call back or medication sent in.

## 2020-04-26 NOTE — Telephone Encounter (Signed)
Why does pt keep having these bouts and requesting cipro/flagyl  Can you assess and get him seen please  I dont feel comfortable Rx. Cipro/Flagyl unless he is seen   And consider CT abdomen/pelvis to further work up

## 2020-04-26 NOTE — Telephone Encounter (Signed)
Please advise, does Patient need to be seen? You referred to Gi in 2020 but I do not see in chart if he has been

## 2020-04-26 NOTE — Telephone Encounter (Signed)
Inform pt below needs to call GI for this  Hi, I agree that he needs to be evaluated in the office. His last contact with our office was in May 2020 for a different problem. We are happy to see him and he can call for an appt when we open on Monday. MS

## 2020-04-29 ENCOUNTER — Telehealth: Payer: Self-pay | Admitting: Gastroenterology

## 2020-04-29 ENCOUNTER — Encounter: Payer: Self-pay | Admitting: Internal Medicine

## 2020-04-29 NOTE — Telephone Encounter (Signed)
Patient wants to know verify that Dr Fuller Plan does want him to schedule ECL instead of just recall colon

## 2020-04-29 NOTE — Telephone Encounter (Signed)
Patient reports pain since Friday.  Requesting "antibiotics for diverticulitis".  Patient is also due for recall colon  And is on Plavix.  He will come in tomorrow at 1:50 to see Dr. Fuller Plan to discuss both.

## 2020-04-29 NOTE — Telephone Encounter (Signed)
Called and spoke to Kevin Webb. Kevin Webb refused to go to GI or to call and schedule an appointment. Pt became very frustrated and states that he will not go and see gastro every time for every issue. He states that he ate peanuts and his stomach has been inflamed and swollen since Friday and just needs [place] to send in Cipro and Flagyl because that is what makes him feel better. He states that he has 10  friends who also have diverticulitis and they do not go and see gastro for every issue and they also feel better with Cipro and Flagyl. Kevin Webb states that if need be he will go to the Urgent Care for his issues but would like Dr. Olivia Mackie to send in the medication. Kevin Webb stated that he does not want to go to GI for any more scans or imaging because he knows he has Diverticulitis and refuses to have any more invasive tests just to be told that he has continuing diverticulitis. He states that he has been in office on 2020 and was told that he will not have to go back to see Dr Fuller Plan for 5 years and does not plan on going back until necessary. Kevin Webb then thanked me for the call and hung up.

## 2020-04-29 NOTE — Telephone Encounter (Signed)
Dr. Servando Salina see pt encounter   Of note not good to keep Rx Cipro and Flagyl if this may not in fact be diverticulitis   GI rec f/u   Or if he wants at CT scan to confirm diverticulitis then we can Rx cipro and Flagyl if needed only based on imaging

## 2020-04-30 ENCOUNTER — Ambulatory Visit (INDEPENDENT_AMBULATORY_CARE_PROVIDER_SITE_OTHER): Payer: Medicare Other | Admitting: Gastroenterology

## 2020-04-30 ENCOUNTER — Encounter: Payer: Self-pay | Admitting: Gastroenterology

## 2020-04-30 ENCOUNTER — Telehealth: Payer: Self-pay

## 2020-04-30 VITALS — BP 124/78 | HR 60 | Ht 69.0 in | Wt 208.0 lb

## 2020-04-30 DIAGNOSIS — Z86010 Personal history of colonic polyps: Secondary | ICD-10-CM

## 2020-04-30 DIAGNOSIS — R1032 Left lower quadrant pain: Secondary | ICD-10-CM | POA: Diagnosis not present

## 2020-04-30 DIAGNOSIS — I6522 Occlusion and stenosis of left carotid artery: Secondary | ICD-10-CM | POA: Diagnosis not present

## 2020-04-30 MED ORDER — PLENVU 140 G PO SOLR: 1.0000 | ORAL | 0 refills | Status: DC

## 2020-04-30 MED ORDER — CIPROFLOXACIN HCL 500 MG PO TABS: 500.0000 mg | ORAL_TABLET | Freq: Two times a day (BID) | ORAL | 0 refills | Status: DC

## 2020-04-30 MED ORDER — METRONIDAZOLE 500 MG PO TABS: 500.0000 mg | ORAL_TABLET | Freq: Two times a day (BID) | ORAL | 0 refills | Status: DC

## 2020-04-30 NOTE — Telephone Encounter (Signed)
Request for surgical clearance:     Endoscopy Procedure  What type of surgery is being performed?     Colonoscopy  When is this surgery scheduled?     05/30/2020  What type of clearance is required ?   Pharmacy  Are there any medications that need to be held prior to surgery and how long? Plavix X 5 days  Practice name and name of physician performing surgery?      [facility]  What is your office phone and fax number?      Phone- [phone]  [phone]  Anesthesia type (None, local, MAC, general) ?       MAC

## 2020-04-30 NOTE — Telephone Encounter (Signed)
Dr. [name], pt with last stent 2018 and known CAD with hx CABG and graft occlusion, medical therapy,  Ok to hold plavix for 5 days for colonoscopy?

## 2020-04-30 NOTE — Progress Notes (Addendum)
    History of Present Illness: This is a 74 year old male with recurrent left lower quadrant pain and a personal history of adenomatous colon polyps.  He has been treated with antibiotics for presumed diverticulitis on several occasions over the past few years.  He notes about once or twice per year his symptoms recur.  His last abdominal/pelvic CT scan performed in December 2020 during an episode of pain showed diverticulosis without diverticulitis.  He relates that eating nuts or popcorn frequently brings on symptoms and he will now try to avoid them.  He notes left lower quadrant pain that began on Thursday.  He had a few pills of Cipro and Flagyl leftover and began taking them with improvement in symptoms.  No other gastrointestinal complaints. Denies weight loss, abdominal pain, constipation, diarrhea, change in stool caliber, melena, hematochezia, nausea, vomiting, dysphagia, reflux symptoms, chest pain.   Current Medications, Allergies, Past Medical History, Past Surgical History, Family History and Social History were reviewed in [employer] record.   Physical Exam: General: Well developed, well nourished, no acute distress Head: Normocephalic and atraumatic Eyes:  sclerae anicteric, EOMI Ears: Normal auditory acuity Mouth: Not examined, mask on during Covid-19 pandemic Lungs: Clear throughout to auscultation Heart: Regular rate and rhythm; no murmurs, rubs or bruits Abdomen: Soft, moderate focal left lower quadrant tenderness and non distended. No masses, hepatosplenomegaly or hernias noted. Normal Bowel sounds Rectal: Deferred to colonoscopy Musculoskeletal: Symmetrical with no gross deformities  Pulses:  Normal pulses noted Extremities: No clubbing, cyanosis, edema or deformities noted Neurological: Alert oriented x 4, grossly nonfocal Psychological:  Alert and cooperative. Normal mood and affect   Assessment and Recommendations:  1.  Recurrent left lower  quadrant pain. Suspected diverticulitis.  Unclear all prior episodes are diverticulitis or related to nut intolerance.  Cipro 500 mg p.o. twice daily for 7 days and Flagyl 500 mg p.o. twice daily for 7 days.  Long-term high-fiber diet with adequate daily water intake.  Avoid nuts and popcorn.  2. Personal history of adenomatous colon polyps.  He is due for surveillance colonoscopy.  Schedule colonoscopy. The risks (including bleeding, perforation, infection, missed lesions, medication reactions and possible hospitalization or surgery if complications occur), benefits, and alternatives to colonoscopy with possible biopsy and possible polypectomy were discussed with the patient and they consent to proceed.   3. CAD/CABG. Hold Plavix 5 days before procedure - will instruct when and how to resume after procedure. Low but real risk of cardiovascular event such as heart attack, stroke, embolism, thrombosis or ischemia/infarct of other organs off Plavix explained and need to seek urgent help if this occurs. The patient consents to proceed. Will communicate by phone or EMR with patient's prescribing provider to confirm that holding Plavix is reasonable in this case.  Continue aspirin 81 mg daily through colonoscopy.  4. GERD.  Follow standard antireflux measures.  Continue pantoprazole 40 mg twice daily and attempt to reduce to once daily if symptoms are well controlled.

## 2020-04-30 NOTE — Patient Instructions (Signed)
If you are age 72 or older, your body mass index should be between 23-30. Your Body mass index is 30.72 kg/m. If this is out of the aforementioned range listed, please consider follow up with your Primary Care Provider.  If you are age 68 or younger, your body mass index should be between 19-25. Your Body mass index is 30.72 kg/m. If this is out of the aformentioned range listed, please consider follow up with your Primary Care Provider.   We have sent the following medications to your pharmacy for you to pick up at your convenience:  Cipro 500 MG 1 tablet two times a day for 7 days.  Flagyl 500 MG 1 tablet two times a day for 7 days.  You have been scheduled for a colonoscopy. Please follow written instructions given to you at your visit today.  Please pick up your prep supplies at the pharmacy within the next 1-3 days. If you use inhalers (even only as needed), please bring them with you on the day of your procedure.

## 2020-05-01 NOTE — Telephone Encounter (Signed)
Dr. Fuller Plan please advise on Dr. Rosezella [place] advisement on plavix.

## 2020-05-01 NOTE — Telephone Encounter (Signed)
High likelihood of removing a polyp or taking a biopsy so proceed with a 5 day hold of Plavix.

## 2020-05-01 NOTE — Telephone Encounter (Signed)
   Primary Cardiologist: Minus Breeding, MD  Chart reviewed as part of pre-operative protocol coverage.   Per Dr. [name]: My suggestion is that if they think this is going to be diagnostic only and less likely to involve resection of a polyp then I would suggest not holding the Plavix but if they think there is a high likelihood of him needing to have a polyp snared then it would be reasonable to hold the Plavix.  I will route this recommendation to the requesting party via Epic fax function and remove from pre-op pool. Please call with questions.  Tami Lin Zeppelin Beckstrand, PA 05/01/2020, 1:50 PM

## 2020-05-01 NOTE — Telephone Encounter (Signed)
My suggestion is that if they think this is going to be diagnostic only and less likely to involve resection of a polyp then I would suggest holding the Plavix but if they think there is a high likelihood of him needing to have a polyp snared then it would be reasonable to hold the Plavix.

## 2020-05-02 NOTE — Telephone Encounter (Signed)
Notified patient of message below to hold his Plavix 5 days prior to procedure, he expressed understanding and agreement.

## 2020-05-28 ENCOUNTER — Telehealth: Payer: Self-pay | Admitting: Gastroenterology

## 2020-05-28 NOTE — Telephone Encounter (Signed)
Rescheduled covid test for 06/05/20 at 9:30am

## 2020-05-28 NOTE — Telephone Encounter (Signed)
Hi Dr. Fuller Plan, this patient just called to reschedule his procedure that was scheduled on Thursday 05/30/20 because he forgot to hold Plavix for 5 days prior procedure. Pt stated to have taken his medication this morning. Patient has rescheduled to 06/07/20 at 9:30am. Thank you.

## 2020-05-30 ENCOUNTER — Encounter: Payer: Medicare Other | Admitting: Gastroenterology

## 2020-06-05 ENCOUNTER — Other Ambulatory Visit: Payer: Self-pay | Admitting: Gastroenterology

## 2020-06-05 DIAGNOSIS — Z1159 Encounter for screening for other viral diseases: Secondary | ICD-10-CM | POA: Diagnosis not present

## 2020-06-06 ENCOUNTER — Encounter: Payer: Self-pay | Admitting: Gastroenterology

## 2020-06-06 ENCOUNTER — Other Ambulatory Visit: Payer: Self-pay

## 2020-06-06 ENCOUNTER — Ambulatory Visit (AMBULATORY_SURGERY_CENTER): Payer: Medicare Other | Admitting: Gastroenterology

## 2020-06-06 VITALS — BP 138/75 | HR 68 | Temp 97.8°F | Resp 20 | Ht 69.0 in | Wt 208.0 lb

## 2020-06-06 DIAGNOSIS — D125 Benign neoplasm of sigmoid colon: Secondary | ICD-10-CM

## 2020-06-06 DIAGNOSIS — D124 Benign neoplasm of descending colon: Secondary | ICD-10-CM | POA: Diagnosis not present

## 2020-06-06 DIAGNOSIS — R1032 Left lower quadrant pain: Secondary | ICD-10-CM

## 2020-06-06 DIAGNOSIS — D123 Benign neoplasm of transverse colon: Secondary | ICD-10-CM | POA: Diagnosis not present

## 2020-06-06 DIAGNOSIS — Z860101 Personal history of adenomatous and serrated colon polyps: Secondary | ICD-10-CM

## 2020-06-06 DIAGNOSIS — I2510 Atherosclerotic heart disease of native coronary artery without angina pectoris: Secondary | ICD-10-CM | POA: Diagnosis not present

## 2020-06-06 DIAGNOSIS — I10 Essential (primary) hypertension: Secondary | ICD-10-CM | POA: Diagnosis not present

## 2020-06-06 DIAGNOSIS — Z86010 Personal history of colonic polyps: Secondary | ICD-10-CM | POA: Diagnosis not present

## 2020-06-06 DIAGNOSIS — E119 Type 2 diabetes mellitus without complications: Secondary | ICD-10-CM | POA: Diagnosis not present

## 2020-06-06 LAB — SARS CORONAVIRUS 2 (TAT 6-24 HRS): SARS Coronavirus 2: NEGATIVE

## 2020-06-06 MED ORDER — DICYCLOMINE HCL 10 MG PO CAPS: 10.0000 mg | ORAL_CAPSULE | Freq: Three times a day (TID) | ORAL | 11 refills | Status: DC | PRN

## 2020-06-06 MED ORDER — SODIUM CHLORIDE 0.9 % IV SOLN: 500.0000 mL | INTRAVENOUS | Status: DC

## 2020-06-06 NOTE — Progress Notes (Signed)
Called to room to assist during endoscopic procedure.  Patient ID and intended procedure confirmed with present staff. Received instructions for my participation in the procedure from the performing physician.  

## 2020-06-06 NOTE — Patient Instructions (Signed)
Discharge instructions given. Handouts on polyps,diverticulosis and hemorrhoids. Prescription sent to pharmacy. Resume Plavix in 2 days at prior dose. YOU HAD AN ENDOSCOPIC PROCEDURE TODAY AT [facility]:   Refer to the procedure report that was given to you for any specific questions about what was found during the examination.  If the procedure report does not answer your questions, please call your gastroenterologist to clarify.  If you requested that your care partner not be given the details of your procedure findings, then the procedure report has been included in a sealed envelope for you to review at your convenience later.  YOU SHOULD EXPECT: Some feelings of bloating in the abdomen. Passage of more gas than usual.  Walking can help get rid of the air that was put into your GI tract during the procedure and reduce the bloating. If you had a lower endoscopy (such as a colonoscopy or flexible sigmoidoscopy) you may notice spotting of blood in your stool or on the toilet paper. If you underwent a bowel prep for your procedure, you may not have a normal bowel movement for a few days.  Please Note:  You might notice some irritation and congestion in your nose or some drainage.  This is from the oxygen used during your procedure.  There is no need for concern and it should clear up in a day or so.  SYMPTOMS TO REPORT IMMEDIATELY:   Following lower endoscopy (colonoscopy or flexible sigmoidoscopy):  Excessive amounts of blood in the stool  Significant tenderness or worsening of abdominal pains  Swelling of the abdomen that is new, acute  Fever of 100F or higher   For urgent or emergent issues, a gastroenterologist can be reached at any hour by calling [phone]. Do not use MyChart messaging for urgent concerns.    DIET:  We do recommend a small meal at first, but then you may proceed to your regular diet.  Drink plenty of fluids but you should avoid alcoholic  beverages for 24 hours.  ACTIVITY:  You should plan to take it easy for the rest of today and you should NOT DRIVE or use heavy machinery until tomorrow (because of the sedation medicines used during the test).    FOLLOW UP: Our staff will call the number listed on your records 48-72 hours following your procedure to check on you and address any questions or concerns that you may have regarding the information given to you following your procedure. If we do not reach you, we will leave a message.  We will attempt to reach you two times.  During this call, we will ask if you have developed any symptoms of COVID 19. If you develop any symptoms (ie: fever, flu-like symptoms, shortness of breath, cough etc.) before then, please call [phone].  If you test positive for Covid 19 in the 2 weeks post procedure, please call and report this information to [place].    If any biopsies were taken you will be contacted by phone or by letter within the next 1-3 weeks.  Please call us at [phone] if you have not heard about the biopsies in 3 weeks.    SIGNATURES/CONFIDENTIALITY: You and/or your care partner have signed paperwork which will be entered into your electronic medical record.  These signatures attest to the fact that that the information above on your After Visit Summary has been reviewed and is understood.  Full responsibility of the confidentiality of this discharge information lies with you and/or  your care-partner. 

## 2020-06-06 NOTE — Op Note (Signed)
[place] Patient Name: Kevin Webb Procedure Date: 06/06/2020 9:12 AM MRN: [id number] Endoscopist: Ladene Artist , MD Age: 75 Referring MD:  Date of Birth: [birth year] Gender: Male Account #: [id number] Procedure:                Colonoscopy Indications:              Surveillance: Personal history of adenomatous                            polyps on last colonoscopy 5 years ago Medicines:                Monitored Anesthesia Care Procedure:                Pre-Anesthesia Assessment:                           - Prior to the procedure, a History and Physical                            was performed, and patient medications and                            allergies were reviewed. The patient's tolerance of                            previous anesthesia was also reviewed. The risks                            and benefits of the procedure and the sedation                            options and risks were discussed with the patient.                            All questions were answered, and informed consent                            was obtained. Prior Anticoagulants: The patient has                            taken Plavix (clopidogrel), last dose was 4 days                            prior to procedure. ASA Grade Assessment: III - A                            patient with severe systemic disease. After                            reviewing the risks and benefits, the patient was                            deemed in satisfactory condition to undergo the  procedure.                           After obtaining informed consent, the colonoscope                            was passed under direct vision. Throughout the                            procedure, the patient's blood pressure, pulse, and                            oxygen saturations were monitored continuously. The                            Olympus CF-HQ190 [id number]) Colonoscope was                             introduced through the anus and advanced to the the                            cecum, identified by appendiceal orifice and                            ileocecal valve. The ileocecal valve, appendiceal                            orifice, and rectum were photographed. The quality                            of the bowel preparation was adequate. The                            colonoscopy was performed without difficulty. The                            patient tolerated the procedure well. Scope In: 9:27:00 AM Scope Out: 9:42:37 AM Scope Withdrawal Time: 0 hours 13 minutes 35 seconds  Total Procedure Duration: 0 hours 15 minutes 37 seconds  Findings:                 The perianal and digital rectal examinations were                            normal.                           A 4 mm polyp was found in the transverse colon. The                            polyp was sessile. The polyp was removed with a                            cold biopsy forceps. Resection and retrieval were  complete.                           Two sessile polyps were found in the sigmoid colon                            and descending colon. The polyps were 7 to 8 mm in                            size. These polyps were removed with a cold snare.                            Resection and retrieval were complete.                           Multiple medium-mouthed diverticula were found in                            the left colon. There was no evidence of                            diverticular bleeding.                           Internal hemorrhoids were found during                            retroflexion. The hemorrhoids were small and Grade                            I (internal hemorrhoids that do not prolapse).                           The exam was otherwise without abnormality on                            direct and retroflexion views. Complications:            No immediate  complications. Estimated blood loss:                            None. Estimated Blood Loss:     Estimated blood loss: none. Impression:               - One 4 mm polyp in the transverse colon, removed                            with a cold biopsy forceps. Resected and retrieved.                           - Two 7 to 8 mm polyps in the sigmoid colon and in                            the descending colon, removed with a cold snare.  Resected and retrieved.                           - Moderate diverticulosis in the left colon.                           - Internal hemorrhoids.                           - The examination was otherwise normal on direct                            and retroflexion views. Recommendation:           - Repeat colonoscopy vs no repeat colonoscopy due                            to age after studies are complete for surveillance                            based on pathology results.                           - Resume Plavix (clopidogrel) in 2 days at prior                            dose. Refer to managing physician for further                            adjustment of therapy.                           - Patient has a contact number available for                            emergencies. The signs and symptoms of potential                            delayed complications were discussed with the                            patient. Return to normal activities tomorrow.                            Written discharge instructions were provided to the                            patient.                           - High fiber diet long term.                           - Continue present medications.                           - Dicyclomine 10  mg po tid prn abdominal pain, #90,                            1 year of refills.                           - Await pathology results. Ladene Artist, MD 06/06/2020 9:52:57 AM This report has been signed  electronically.

## 2020-06-06 NOTE — Progress Notes (Signed)
Report to PACU, RN, vss, BBS= Clear.  

## 2020-06-07 ENCOUNTER — Encounter: Payer: Medicare Other | Admitting: Gastroenterology

## 2020-06-10 ENCOUNTER — Telehealth: Payer: Self-pay | Admitting: *Deleted

## 2020-06-10 NOTE — Telephone Encounter (Signed)
  Follow up Call-  Call back number 06/06/2020 10/03/2018  Post procedure Call Back phone  # [phone]  Permission to leave phone message Yes Yes  Some recent data might be hidden     Patient questions:  Do you have a fever, pain , or abdominal swelling? No. Pain Score  0 *  Have you tolerated food without any problems? Yes.    Have you been able to return to your normal activities? Yes.    Do you have any questions about your discharge instructions: Diet   No. Medications  No. Follow up visit  No.  Do you have questions or concerns about your Care? No.  Actions: * If pain score is 4 or above: No action needed, pain <4.  1. Have you developed a fever since your procedure? no  2.   Have you had an respiratory symptoms (SOB or cough) since your procedure? no  3.   Have you tested positive for COVID 19 since your procedure no  4.   Have you had any family members/close contacts diagnosed with the COVID 19 since your procedure?  no   If yes to any of these questions please route to Joylene John, RN and Joella Prince, RN

## 2020-06-11 ENCOUNTER — Telehealth: Payer: Medicare Other | Admitting: Gastroenterology

## 2020-06-12 ENCOUNTER — Encounter: Payer: Self-pay | Admitting: Gastroenterology

## 2020-06-18 ENCOUNTER — Encounter: Payer: Self-pay | Admitting: Internal Medicine

## 2020-06-18 NOTE — Addendum Note (Signed)
Addended by: Orland Mustard on: 06/18/2020 02:33 PM   Modules accepted: Orders

## 2020-06-24 ENCOUNTER — Encounter: Payer: Self-pay | Admitting: Internal Medicine

## 2020-06-24 ENCOUNTER — Other Ambulatory Visit (INDEPENDENT_AMBULATORY_CARE_PROVIDER_SITE_OTHER): Payer: Medicare Other

## 2020-06-24 ENCOUNTER — Other Ambulatory Visit: Payer: Self-pay

## 2020-06-24 DIAGNOSIS — E119 Type 2 diabetes mellitus without complications: Secondary | ICD-10-CM

## 2020-06-24 DIAGNOSIS — Z125 Encounter for screening for malignant neoplasm of prostate: Secondary | ICD-10-CM

## 2020-06-24 DIAGNOSIS — R001 Bradycardia, unspecified: Secondary | ICD-10-CM | POA: Diagnosis not present

## 2020-06-24 DIAGNOSIS — Z1329 Encounter for screening for other suspected endocrine disorder: Secondary | ICD-10-CM | POA: Diagnosis not present

## 2020-06-24 DIAGNOSIS — N401 Enlarged prostate with lower urinary tract symptoms: Secondary | ICD-10-CM

## 2020-06-24 DIAGNOSIS — Z20822 Contact with and (suspected) exposure to covid-19: Secondary | ICD-10-CM

## 2020-06-24 DIAGNOSIS — E1159 Type 2 diabetes mellitus with other circulatory complications: Secondary | ICD-10-CM | POA: Diagnosis not present

## 2020-06-24 DIAGNOSIS — I152 Hypertension secondary to endocrine disorders: Secondary | ICD-10-CM | POA: Diagnosis not present

## 2020-06-24 LAB — COMPREHENSIVE METABOLIC PANEL
ALT: 15 U/L (ref 0–53)
AST: 18 U/L (ref 0–37)
Albumin: 4.2 g/dL (ref 3.5–5.2)
Alkaline Phosphatase: 50 U/L (ref 39–117)
BUN: 10 mg/dL (ref 6–23)
CO2: 28 mEq/L (ref 19–32)
Calcium: 9.2 mg/dL (ref 8.4–10.5)
Chloride: 101 mEq/L (ref 96–112)
Creatinine, Ser: 0.94 mg/dL (ref 0.40–1.50)
GFR: 80.97 mL/min (ref >60.00)
Glucose, Bld: 120 mg/dL — ABNORMAL HIGH (ref 70–99)
Potassium: 4.5 mEq/L (ref 3.5–5.1)
Sodium: 137 mEq/L (ref 135–145)
Total Bilirubin: 1 mg/dL (ref 0.2–1.2)
Total Protein: 6.4 g/dL (ref 6.0–8.3)

## 2020-06-24 LAB — HEMOGLOBIN A1C: Hgb A1c MFr Bld: 6.6 % — ABNORMAL HIGH (ref 4.6–6.5)

## 2020-06-24 LAB — CBC WITH DIFFERENTIAL/PLATELET
Basophils Absolute: 0 10*3/uL (ref 0.0–0.1)
Basophils Relative: 0.4 % (ref 0.0–3.0)
Eosinophils Absolute: 0 10*3/uL (ref 0.0–0.7)
Eosinophils Relative: 0.8 % (ref 0.0–5.0)
HCT: 42.5 % (ref 39.0–52.0)
Hemoglobin: 14.4 g/dL (ref 13.0–17.0)
Lymphocytes Relative: 23.4 % (ref 12.0–46.0)
Lymphs Abs: 1.2 10*3/uL (ref 0.7–4.0)
MCHC: 33.9 g/dL (ref 30.0–36.0)
MCV: 91.2 fl (ref 78.0–100.0)
Monocytes Absolute: 0.4 10*3/uL (ref 0.1–1.0)
Monocytes Relative: 7.4 % (ref 3.0–12.0)
Neutro Abs: 3.6 10*3/uL (ref 1.4–7.7)
Neutrophils Relative %: 68 % (ref 43.0–77.0)
Platelets: 148 10*3/uL — ABNORMAL LOW (ref 150.0–400.0)
RBC: 4.67 Mil/uL (ref 4.22–5.81)
RDW: 14.2 % (ref 11.5–15.5)
WBC: 5.2 10*3/uL (ref 4.0–10.5)

## 2020-06-24 LAB — LIPID PANEL
Cholesterol: 135 mg/dL (ref 0–200)
HDL: 45.6 mg/dL (ref >39.00)
LDL Cholesterol: 56 mg/dL (ref 0–99)
NonHDL: 89.76
Total CHOL/HDL Ratio: 3
Triglycerides: 170 mg/dL — ABNORMAL HIGH (ref 0.0–149.0)
VLDL: 34 mg/dL (ref 0.0–40.0)

## 2020-06-24 LAB — PSA, MEDICARE: PSA: 1.72 ng/ml (ref 0.10–4.00)

## 2020-06-24 LAB — TSH: TSH: 1.83 u[IU]/mL (ref 0.35–4.50)

## 2020-06-24 LAB — MICROALBUMIN / CREATININE URINE RATIO
Creatinine,U: 128.3 mg/dL
Microalb Creat Ratio: 1.1 mg/g (ref 0.0–30.0)
Microalb, Ur: 1.5 mg/dL (ref 0.0–1.9)

## 2020-06-25 LAB — SARS-COV-2 SEMI-QUANTITATIVE TOTAL ANTIBODY, SPIKE
SARS-CoV-2 Semi-Quant Total Ab: <0.8 U/mL (ref <0.8)
SARS-CoV-2 Spike Ab Interp: NEGATIVE

## 2020-06-26 ENCOUNTER — Other Ambulatory Visit: Payer: Self-pay

## 2020-06-26 ENCOUNTER — Encounter: Payer: Self-pay | Admitting: Internal Medicine

## 2020-06-26 ENCOUNTER — Ambulatory Visit (INDEPENDENT_AMBULATORY_CARE_PROVIDER_SITE_OTHER): Payer: Medicare Other | Admitting: Internal Medicine

## 2020-06-26 VITALS — BP 108/58 | HR 68 | Temp 98.1°F | Ht 69.0 in | Wt 211.8 lb

## 2020-06-26 DIAGNOSIS — D126 Benign neoplasm of colon, unspecified: Secondary | ICD-10-CM

## 2020-06-26 DIAGNOSIS — Z23 Encounter for immunization: Secondary | ICD-10-CM | POA: Diagnosis not present

## 2020-06-26 DIAGNOSIS — K5792 Diverticulitis of intestine, part unspecified, without perforation or abscess without bleeding: Secondary | ICD-10-CM | POA: Diagnosis not present

## 2020-06-26 HISTORY — DX: Benign neoplasm of colon, unspecified: D12.6

## 2020-06-26 MED ORDER — TETANUS-DIPHTH-ACELL PERTUSSIS 5-2.5-18.5 LF-MCG/0.5 IM SUSP: 0.5000 mL | Freq: Once | INTRAMUSCULAR | 0 refills | Status: AC

## 2020-06-26 NOTE — Patient Instructions (Addendum)
Ref. Range 06/24/2020 08:48  PSA Latest Ref Range: 0.10 - 4.00 ng/ml 1.72      Ref. Range 07/18/2019 09:56  VITD Latest Ref Range: 30.00 - 100.00 ng/mL 50.88      Ref. Range 10/24/2019 09:33  Sex Horm Binding Glob, Serum Latest Ref Range: 22 - 77 nmol/L 48  Testosterone Latest Ref Range: 250 - 827 ng/dL 417  Testosterone, Bioavailable Latest Ref Range: 15.0 - 150.0 ng/dL 79.0  Testosterone Free Latest Ref Range: 6.0 - 73.0 pg/mL 40.1     Diverticulitis  Diverticulitis is infection or inflammation of small pouches (diverticula) in the colon that form due to a condition called diverticulosis. Diverticula can trap stool (feces) and bacteria, causing infection and inflammation. Diverticulitis may cause severe stomach pain and diarrhea. It may lead to tissue damage in the colon that causes bleeding or blockage. The diverticula may also burst (rupture) and cause infected stool to enter other areas of the abdomen. What are the causes? This condition is caused by stool becoming trapped in the diverticula, which allows bacteria to grow in the diverticula. This leads to inflammation and infection. What increases the risk? You are more likely to develop this condition if you have diverticulosis. The risk increases if you:  Are overweight or obese.  Do not get enough exercise.  Drink alcohol.  Use tobacco products.  Eat a diet that has a lot of red meat such as beef, pork, or lamb.  Eat a diet that does not include enough fiber. High-fiber foods include fruits, vegetables, beans, nuts, and whole grains.  Are over 56 years of age. What are the signs or symptoms? Symptoms of this condition may include:  Pain and tenderness in the abdomen. The pain is normally located on the left side of the abdomen, but it may occur in other areas.  Fever and chills.  Nausea.  Vomiting.  Cramping.  Bloating.  Changes in bowel routines.  Blood in your stool. How is this diagnosed? This  condition is diagnosed based on:  Your medical history.  A physical exam.  Tests to make sure there is nothing else causing your condition. These tests may include: ? Blood tests. ? Urine tests. ? CT scan of the abdomen. How is this treated? Most cases of this condition are mild and can be treated at home. Treatment may include:  Taking over-the-counter pain medicines.  Following a clear liquid diet.  Taking antibiotic medicines by mouth.  Resting. More severe cases may need to be treated at a hospital. Treatment may include:  Not eating or drinking.  Taking prescription pain medicine.  Receiving antibiotic medicines through an IV.  Receiving fluids and nutrition through an IV.  Surgery. When your condition is under control, your health care provider may recommend that you have a colonoscopy. This is an exam to look at the entire large intestine. During the exam, a lubricated, bendable tube is inserted into the anus and then passed into the rectum, colon, and other parts of the large intestine. A colonoscopy can show how severe your diverticula are and whether something else may be causing your symptoms. Follow these instructions at home: Medicines  Take over-the-counter and prescription medicines only as told by your health care provider. These include fiber supplements, probiotics, and stool softeners.  If you were prescribed an antibiotic medicine, take it as told by your health care provider. Do not stop taking the antibiotic even if you start to feel better.  Ask your health care provider  if the medicine prescribed to you requires you to avoid driving or using machinery. Eating and drinking  Follow a full liquid diet or another diet as directed by your health care provider.  After your symptoms improve, your health care provider may tell you to change your diet. He or she may recommend that you eat a diet that contains at least 25 grams (25 g) of fiber daily. Fiber  makes it easier to pass stool. Healthy sources of fiber include: ? Berries. One cup contains 4-8 grams of fiber. ? Beans or lentils. One-half cup contains 5-8 grams of fiber. ? Green vegetables. One cup contains 4 grams of fiber.  Avoid eating red meat.   General instructions  Do not use any products that contain nicotine or tobacco, such as cigarettes, e-cigarettes, and chewing tobacco. If you need help quitting, ask your health care provider.  Exercise for at least 30 minutes, 3 times each week. You should exercise hard enough to raise your heart rate and break a sweat.  Keep all follow-up visits as told by your health care provider. This is important. You may need to have a colonoscopy. Contact a health care provider if:  Your pain does not improve.  Your bowel movements do not return to normal. Get help right away if:  Your pain gets worse.  Your symptoms do not get better with treatment.  Your symptoms suddenly get worse.  You have a fever.  You vomit more than one time.  You have stools that are bloody, black, or tarry. Summary  Diverticulitis is infection or inflammation of small pouches (diverticula) in the colon that form due to a condition called diverticulosis. Diverticula can trap stool (feces) and bacteria, causing infection and inflammation.  You are at higher risk for this condition if you have diverticulosis and you eat a diet that does not include enough fiber.  Most cases of this condition are mild and can be treated at home. More severe cases may need to be treated at a hospital.  When your condition is under control, your health care provider may recommend that you have an exam called a colonoscopy. This exam can show how severe your diverticula are and whether something else may be causing your symptoms.  Keep all follow-up visits as told by your health care provider. This is important. This information is not intended to replace advice given to you by  your health care provider. Make sure you discuss any questions you have with your health care provider. Document Revised: 02/13/2019 Document Reviewed: 02/13/2019 Elsevier Patient Education  2021 [place].  Cholesterol Content in Foods Cholesterol is a waxy, fat-like substance that helps to carry fat in the blood. The body needs cholesterol in small amounts, but too much cholesterol can cause damage to the arteries and heart. Most people should eat less than 200 milligrams (mg) of cholesterol a day. Foods with cholesterol Cholesterol is found in animal-based foods, such as meat, seafood, and dairy. Generally, low-fat dairy and lean meats have less cholesterol than full-fat dairy and fatty meats. The milligrams of cholesterol per serving (mg per serving) of common cholesterol-containing foods are listed below. Meat and other proteins  Egg -- one large whole egg has 186 mg.  Veal shank -- 4 oz has 141 mg.  Lean ground [place] (93% lean) -- 4 oz has 118 mg.  Fat-trimmed lamb loin -- 4 oz has 106 mg.  Lean ground beef (90% lean) -- 4 oz has 100 mg.  Lobster --  3.5 oz has 90 mg.  Pork loin chops -- 4 oz has 86 mg.  Canned salmon -- 3.5 oz has 83 mg.  Fat-trimmed beef top loin -- 4 oz has 78 mg.  Frankfurter -- 1 frank (3.5 oz) has 77 mg.  Crab -- 3.5 oz has 71 mg.  Roasted chicken without skin, white meat -- 4 oz has 66 mg.  Light bologna -- 2 oz has 45 mg.  Deli-cut [place] -- 2 oz has 31 mg.  Canned tuna -- 3.5 oz has 31 mg.  Berniece Salines -- 1 oz has 29 mg.  Oysters and mussels (raw) -- 3.5 oz has 25 mg.  Mackerel -- 1 oz has 22 mg.  Trout -- 1 oz has 20 mg.  Pork sausage -- 1 link (1 oz) has 17 mg.  Salmon -- 1 oz has 16 mg.  Tilapia -- 1 oz has 14 mg. Dairy  Soft-serve ice cream --  cup (4 oz) has 103 mg.  Whole-milk yogurt -- 1 cup (8 oz) has 29 mg.  Cheddar cheese -- 1 oz has 28 mg.  American cheese -- 1 oz has 28 mg.  Whole milk -- 1 cup (8 oz) has 23  mg.  2% milk -- 1 cup (8 oz) has 18 mg.  Cream cheese -- 1 tablespoon (Tbsp) has 15 mg.  Cottage cheese --  cup (4 oz) has 14 mg.  Low-fat (1%) milk -- 1 cup (8 oz) has 10 mg.  Sour cream -- 1 Tbsp has 8.5 mg.  Low-fat yogurt -- 1 cup (8 oz) has 8 mg.  Nonfat Greek yogurt -- 1 cup (8 oz) has 7 mg.  Half-and-half cream -- 1 Tbsp has 5 mg. Fats and oils  Cod liver oil -- 1 tablespoon (Tbsp) has 82 mg.  Butter -- 1 Tbsp has 15 mg.  Lard -- 1 Tbsp has 14 mg.  Bacon grease -- 1 Tbsp has 14 mg.  Mayonnaise -- 1 Tbsp has 5-10 mg.  Margarine -- 1 Tbsp has 3-10 mg. Exact amounts of cholesterol in these foods may vary depending on specific ingredients and brands.   Foods without cholesterol Most plant-based foods do not have cholesterol unless you combine them with a food that has cholesterol. Foods without cholesterol include:  Grains and cereals.  Vegetables.  Fruits.  Vegetable oils, such as olive, canola, and sunflower oil.  Legumes, such as peas, beans, and lentils.  Nuts and seeds.  Egg whites.   Summary  The body needs cholesterol in small amounts, but too much cholesterol can cause damage to the arteries and heart.  Most people should eat less than 200 milligrams (mg) of cholesterol a day. This information is not intended to replace advice given to you by your health care provider. Make sure you discuss any questions you have with your health care provider. Document Revised: 09/25/2019 Document Reviewed: 09/25/2019 Elsevier Patient Education  2021 [place].  High Cholesterol  High cholesterol is a condition in which the blood has high levels of a white, waxy substance similar to fat (cholesterol). The liver makes all the cholesterol that the body needs. The human body needs small amounts of cholesterol to help build cells. A person gets extra or excess cholesterol from the food that he or she eats. The blood carries cholesterol from the liver to the rest  of the body. If you have high cholesterol, deposits (plaques) may build up on the walls of your arteries. Arteries are the blood vessels that  carry blood away from your heart. These plaques make the arteries narrow and stiff. Cholesterol plaques increase your risk for heart attack and stroke. Work with your health care provider to keep your cholesterol levels in a healthy range. What increases the risk? The following factors may make you more likely to develop this condition:  Eating foods that are high in animal fat (saturated fat) or cholesterol.  Being overweight.  Not getting enough exercise.  A family history of high cholesterol (familial hypercholesterolemia).  Use of tobacco products.  Having diabetes. What are the signs or symptoms? There are no symptoms of this condition. How is this diagnosed? This condition may be diagnosed based on the results of a blood test.  If you are older than 73 years of age, your health care provider may check your cholesterol levels every 4-6 years.  You may be checked more often if you have high cholesterol or other risk factors for heart disease. The blood test for cholesterol measures:  "Bad" cholesterol, or LDL cholesterol. This is the main type of cholesterol that causes heart disease. The desired level is less than 100 mg/dL.  "Good" cholesterol, or HDL cholesterol. HDL helps protect against heart disease by cleaning the arteries and carrying the LDL to the liver for processing. The desired level for HDL is 60 mg/dL or higher.  Triglycerides. These are fats that your body can store or burn for energy. The desired level is less than 150 mg/dL.  Total cholesterol. This measures the total amount of cholesterol in your blood and includes LDL, HDL, and triglycerides. The desired level is less than 200 mg/dL. How is this treated? This condition may be treated with:  Diet changes. You may be asked to eat foods that have more fiber and less  saturated fats or added sugar.  Lifestyle changes. These may include regular exercise, maintaining a healthy weight, and quitting use of tobacco products.  Medicines. These are given when diet and lifestyle changes have not worked. You may be prescribed a statin medicine to help lower your cholesterol levels. Follow these instructions at home: Eating and drinking  Eat a healthy, balanced diet. This diet includes: ? Daily servings of a variety of fresh, frozen, or canned fruits and vegetables. ? Daily servings of whole grain foods that are rich in fiber. ? Foods that are low in saturated fats and trans fats. These include poultry and fish without skin, lean cuts of meat, and low-fat dairy products. ? A variety of fish, especially oily fish that contain omega-3 fatty acids. Aim to eat fish at least 2 times a week.  Avoid foods and drinks that have added sugar.  Use healthy cooking methods, such as roasting, grilling, broiling, baking, poaching, steaming, and stir-frying. Do not fry your food except for stir-frying.   Lifestyle  Get regular exercise. Aim to exercise for a total of 150 minutes a week. Increase your activity level by doing activities such as gardening, walking, and taking the stairs.  Do not use any products that contain nicotine or tobacco, such as cigarettes, e-cigarettes, and chewing tobacco. If you need help quitting, ask your health care provider.   General instructions  Take over-the-counter and prescription medicines only as told by your health care provider.  Keep all follow-up visits as told by your health care provider. This is important. Where to find more information  American Heart Association: www.heart.org  National Heart, Lung, and Blood Institute: https://wilson-eaton.com/ Contact a health care provider if:  You have  trouble achieving or maintaining a healthy diet or weight.  You are starting an exercise program.  You are unable to stop smoking. Get help  right away if:  You have chest pain.  You have trouble breathing.  You have any symptoms of a stroke. "BE FAST" is an easy way to remember the main warning signs of a stroke: ? B - Balance. Signs are dizziness, sudden trouble walking, or loss of balance. ? E - Eyes. Signs are trouble seeing or a sudden change in vision. ? F - Face. Signs are sudden weakness or numbness of the face, or the face or eyelid drooping on one side. ? A - Arms. Signs are weakness or numbness in an arm. This happens suddenly and usually on one side of the body. ? S - Speech. Signs are sudden trouble speaking, slurred speech, or trouble understanding what people say. ? T - Time. Time to call emergency services. Write down what time symptoms started.  You have other signs of a stroke, such as: ? A sudden, severe headache with no known cause. ? Nausea or vomiting. ? Seizure. These symptoms may represent a serious problem that is an emergency. Do not wait to see if the symptoms will go away. Get medical help right away. Call your local emergency services (911 in the [place] Do not drive yourself to the hospital. Summary  Cholesterol plaques increase your risk for heart attack and stroke. Work with your health care provider to keep your cholesterol levels in a healthy range.  Eat a healthy, balanced diet, get regular exercise, and maintain a healthy weight.  Do not use any products that contain nicotine or tobacco, such as cigarettes, e-cigarettes, and chewing tobacco.  Get help right away if you have any symptoms of a stroke. This information is not intended to replace advice given to you by your health care provider. Make sure you discuss any questions you have with your health care provider. Document Revised: 04/03/2019 Document Reviewed: 04/03/2019 Elsevier Patient Education  2021 [place].   Budget-Friendly Healthy Eating There are many ways to save money at the grocery store and continue to eat healthy.  You can be successful if you:  Plan meals according to your budget.  Make a grocery list and only purchase food according to your grocery list.  Prepare food yourself at home. What are tips for following this plan? Reading food labels  Compare food labels between brand name foods and the store brand. Often the nutritional value is the same, but the store brand is lower cost.  Look for products that do not have added sugar, fat, or salt (sodium). These often cost the same but are healthier for you. Products may be labeled as: ? Sugar-free. ? Nonfat. ? Low-fat. ? Sodium-free. ? Low-sodium.  Look for lean ground beef labeled as at least 92% lean and 8% fat. Shopping  Buy only the items on your grocery list and go only to the areas of the store that have the items on your list.  Use coupons only for foods and brands you normally buy. Avoid buying items you wouldn't normally buy simply because they are on sale.  Check online and in newspapers for weekly deals.  Buy healthy items from the bulk bins when available, such as herbs, spices, flour, pasta, nuts, and dried fruit.  Buy fruits and vegetables that are in season. Prices are usually lower on in-season produce.  Look at the unit price on the price tag. Use  it to compare different brands and sizes to find out which item is the best deal.  Choose healthy items that are often low-cost, such as carrots, potatoes, apples, bananas, and oranges. Dried or canned beans are a low-cost protein source.  Buy in bulk and freeze extra food. Items you can buy in bulk include meats, fish, poultry, frozen fruits, and frozen vegetables.  Avoid buying "ready-to-eat" foods, such as pre-cut fruits and vegetables and pre-made salads.  If possible, shop around to discover where you can find the best prices. Consider other retailers such as dollar stores, larger [employer], local fruit and vegetable stands, and farmers markets.  Do not shop when  you are hungry. If you shop while hungry, it may be hard to stick to your list and budget.  Resist impulse buying. Use your grocery list as your official plan for the week.  Buy a variety of vegetables and fruits by purchasing fresh, frozen, and canned items.  Look at the top and bottom shelves for deals. Foods at eye level (eye level of an adult or child) are usually more expensive.  Be efficient with your time when shopping. The more time you spend at the store, the more money you are likely to spend.  To save money when choosing more expensive foods like meats and dairy: ? Choose cheaper cuts of meat, such as bone-in chicken thighs and drumsticks instead of skinless and boneless chicken. When you are ready to prepare the chicken, you can remove the skin yourself to make it healthier. ? Choose lean meats like chicken or [place] instead of beef. ? Choose canned seafood, such as tuna, salmon, or sardines. ? Buy eggs as a low-cost source of protein. ? Buy dried beans and peas, such as lentils, split peas, or kidney beans instead of meats. Dried beans and peas are a good alternative source of protein. ? Buy the larger tubs of yogurt instead of individual-sized containers.  Choose water instead of sodas and other sweetened beverages.  Avoid buying chips, cookies, and other "junk food." These items are usually expensive and not healthy.   Cooking  Make extra food and freeze the extras in meal-sized containers or in individual portions for fast meals and snacks.  Pre-cook on days when you have extra time to prepare meals in advance. You can keep these meals in the fridge or freezer and reheat for a quick meal.  When you come home from the grocery store, wash, peel, and cut fruits and vegetables so they are ready to use and eat. This will help reduce food waste. Meal planning  Do not eat out or get fast food. Prepare food at home.  Make a grocery list and make sure to bring it with you to  the store. If you have a smart phone, you could use your phone to create your shopping list.  Plan meals and snacks according to a grocery list and budget you create.  Use leftovers in your meal plan for the week.  Look for recipes where you can cook once and make enough food for two meals.  Prepare budget-friendly types of meals like stews, casseroles, and stir-fry dishes.  Try some meatless meals or try "no cook" meals like salads.  Make sure that half your plate is filled with fruits or vegetables. Choose from fresh, frozen, or canned fruits and vegetables. If eating canned, remember to rinse them before eating. This will remove any excess salt added for packaging. Summary  Eating healthy on  a budget is possible if you plan your meals according to your budget, purchase according to your budget and grocery list, and prepare food yourself.  Tips for buying more food on a limited budget include buying generic brands, using coupons only for foods you normally buy, and buying healthy items from the bulk bins when available.  Tips for buying cheaper food to replace expensive food include choosing cheaper, lean cuts of meat, and buying dried beans and peas. This information is not intended to replace advice given to you by your health care provider. Make sure you discuss any questions you have with your health care provider. Document Revised: 02/15/2020 Document Reviewed: 02/15/2020 Elsevier Patient Education  2021 [place].  Diabetes Mellitus and Nutrition, Adult When you have diabetes, or diabetes mellitus, it is very important to have healthy eating habits because your blood sugar (glucose) levels are greatly affected by what you eat and drink. Eating healthy foods in the right amounts, at about the same times every day, can help you:  Control your blood glucose.  Lower your risk of heart disease.  Improve your blood pressure.  Reach or maintain a healthy weight. What can affect  my meal plan? Every person with diabetes is different, and each person has different needs for a meal plan. Your health care provider may recommend that you work with a dietitian to make a meal plan that is best for you. Your meal plan may vary depending on factors such as:  The calories you need.  The medicines you take.  Your weight.  Your blood glucose, blood pressure, and cholesterol levels.  Your activity level.  Other health conditions you have, such as heart or kidney disease. How do carbohydrates affect me? Carbohydrates, also called carbs, affect your blood glucose level more than any other type of food. Eating carbs naturally raises the amount of glucose in your blood. Carb counting is a method for keeping track of how many carbs you eat. Counting carbs is important to keep your blood glucose at a healthy level, especially if you use insulin or take certain oral diabetes medicines. It is important to know how many carbs you can safely have in each meal. This is different for every person. Your dietitian can help you calculate how many carbs you should have at each meal and for each snack. How does alcohol affect me? Alcohol can cause a sudden decrease in blood glucose (hypoglycemia), especially if you use insulin or take certain oral diabetes medicines. Hypoglycemia can be a life-threatening condition. Symptoms of hypoglycemia, such as sleepiness, dizziness, and confusion, are similar to symptoms of having too much alcohol.  Do not drink alcohol if: ? Your health care provider tells you not to drink. ? You are pregnant, may be pregnant, or are planning to become pregnant.  If you drink alcohol: ? Do not drink on an empty stomach. ? Limit how much you use to:  0-1 drink a day for women.  0-2 drinks a day for men. ? Be aware of how much alcohol is in your drink. In the [place] one drink equals one 12 oz bottle of beer (355 mL), one 5 oz glass of wine (148 mL), or one 1 oz glass  of hard liquor (44 mL). ? Keep yourself hydrated with water, diet soda, or unsweetened iced tea.  Keep in mind that regular soda, juice, and other mixers may contain a lot of sugar and must be counted as carbs. What are tips for following  this plan? Reading food labels  Start by checking the serving size on the "Nutrition Facts" label of packaged foods and drinks. The amount of calories, carbs, fats, and other nutrients listed on the label is based on one serving of the item. Many items contain more than one serving per package.  Check the total grams (g) of carbs in one serving. You can calculate the number of servings of carbs in one serving by dividing the total carbs by 15. For example, if a food has 30 g of total carbs per serving, it would be equal to 2 servings of carbs.  Check the number of grams (g) of saturated fats and trans fats in one serving. Choose foods that have a low amount or none of these fats.  Check the number of milligrams (mg) of salt (sodium) in one serving. Most people should limit total sodium intake to less than 2,300 mg per day.  Always check the nutrition information of foods labeled as "low-fat" or "nonfat." These foods may be higher in added sugar or refined carbs and should be avoided.  Talk to your dietitian to identify your daily goals for nutrients listed on the label. Shopping  Avoid buying canned, pre-made, or processed foods. These foods tend to be high in fat, sodium, and added sugar.  Shop around the outside edge of the grocery store. This is where you will most often find fresh fruits and vegetables, bulk grains, fresh meats, and fresh dairy. Cooking  Use low-heat cooking methods, such as baking, instead of high-heat cooking methods like deep frying.  Cook using healthy oils, such as olive, canola, or sunflower oil.  Avoid cooking with butter, cream, or high-fat meats. Meal planning  Eat meals and snacks regularly, preferably at the same times  every day. Avoid going long periods of time without eating.  Eat foods that are high in fiber, such as fresh fruits, vegetables, beans, and whole grains. Talk with your dietitian about how many servings of carbs you can eat at each meal.  Eat 4-6 oz (112-168 g) of lean protein each day, such as lean meat, chicken, fish, eggs, or tofu. One ounce (oz) of lean protein is equal to: ? 1 oz (28 g) of meat, chicken, or fish. ? 1 egg. ?  cup (62 g) of tofu.  Eat some foods each day that contain healthy fats, such as avocado, nuts, seeds, and fish.   What foods should I eat? Fruits Berries. Apples. Oranges. Peaches. Apricots. Plums. Grapes. Mango. Papaya. Pomegranate. Kiwi. Cherries. Vegetables Lettuce. Spinach. Leafy greens, including kale, chard, collard greens, and mustard greens. Beets. Cauliflower. Cabbage. Broccoli. Carrots. Green beans. Tomatoes. Peppers. Onions. Cucumbers. Brussels sprouts. Grains Whole grains, such as whole-wheat or whole-grain bread, crackers, tortillas, cereal, and pasta. Unsweetened oatmeal. Quinoa. Brown or wild rice. Meats and other proteins Seafood. Poultry without skin. Lean cuts of poultry and beef. Tofu. Nuts. Seeds. Dairy Low-fat or fat-free dairy products such as milk, yogurt, and cheese. The items listed above may not be a complete list of foods and beverages you can eat. Contact a dietitian for more information. What foods should I avoid? Fruits Fruits canned with syrup. Vegetables Canned vegetables. Frozen vegetables with butter or cream sauce. Grains Refined white flour and flour products such as bread, pasta, snack foods, and cereals. Avoid all processed foods. Meats and other proteins Fatty cuts of meat. Poultry with skin. Breaded or fried meats. Processed meat. Avoid saturated fats. Dairy Full-fat yogurt, cheese, or milk. Beverages Sweetened  drinks, such as soda or iced tea. The items listed above may not be a complete list of foods and beverages  you should avoid. Contact a dietitian for more information. Questions to ask a health care provider  Do I need to meet with a diabetes educator?  Do I need to meet with a dietitian?  What number can I call if I have questions?  When are the best times to check my blood glucose? Where to find more information:  American Diabetes Association: diabetes.org  Academy of Nutrition and Dietetics: www.eatright.[employer] of Diabetes and Digestive and Kidney Diseases: [employer].dk  Association of Diabetes Care and Education Specialists: www.diabeteseducator.org Summary  It is important to have healthy eating habits because your blood sugar (glucose) levels are greatly affected by what you eat and drink.  A healthy meal plan will help you control your blood glucose and maintain a healthy lifestyle.  Your health care provider may recommend that you work with a dietitian to make a meal plan that is best for you.  Keep in mind that carbohydrates (carbs) and alcohol have immediate effects on your blood glucose levels. It is important to count carbs and to use alcohol carefully. This information is not intended to replace advice given to you by your health care provider. Make sure you discuss any questions you have with your health care provider. Document Revised: 04/11/2019 Document Reviewed: 04/11/2019 Elsevier Patient Education  2021 [employer].

## 2020-06-26 NOTE — Progress Notes (Signed)
Chief Complaint  Patient presents with  . Follow-up   F/u  1. Diverticulitis resolved 06/06/20 colonoscopy Dr. Silvio Pate tubular polyp f/u in 3 years diverticulitis sxs resolved he was given 1 year supply of cipro and flagyl to try to help with sx's prn disc Abx overuse, resistance and side effects I.e c diff and he understands only to use prn  GI rec avoid nuts and popcorn He did stop drinking alcohol recently and we also reviewed this can be a risk factor   2. Reviewed labs 06/24/20 A1C 6.6 declines meds triglycerides >170 will try healthy diet and exercise upcoming appt urology Dr. Rogers Blocker  Review of Systems  Constitutional: Negative for weight loss.  HENT: Negative for hearing loss.   Eyes: Negative for blurred vision.  Respiratory: Negative for shortness of breath.   Cardiovascular: Negative for chest pain.  Gastrointestinal: Negative for abdominal pain.  Musculoskeletal: Negative for falls and joint pain.  Psychiatric/Behavioral: Negative for memory loss.   Past Medical History:  Diagnosis Date  . Acute cholecystitis 04/18/2013  . Adenomatous colon polyp 12/2004  . Anemia   . Blood transfusion without reported diagnosis   . BPH associated with nocturia   . CAD (coronary artery disease)    a. s/p CABG x4 in 2000 with LIMA-LAD, reverse SVG-IM, reverse SVG-acute margin and reverse SVG-RCA b. s/p BMS to SVG-RI in 2012 c. 12/2016: PCI/DES to SVG-RCA and PCI/DES to SVG-RI (Dr. [name])   . Carotid artery stenosis    mild   . Cataract   . Clotting disorder ([place])   . Diabetes mellitus without complication ([place]) 99/37/1696   recent A1C 6.1 12/25/16 controlled with diet and exercise   . Diverticulitis 2008/2009  . GERD (gastroesophageal reflux disease)    controlled as of 04/01/17   . HSV infection   . HTN (hypertension)   . Hx of dysplastic nevus 05/04/2018   upper back spinal   . Hyperglycemia   . Hyperlipidemia   . Impingement syndrome of left shoulder 2016  . Low testosterone     Dr [name], [place] ,[place]  . Myocardial infarction ([place])   . Osteoarthritis of right knee    With trace effusion  . Peyronie disease   . RLS (restless legs syndrome) 08/15/2013  . Rotator cuff injury    right; at 73-27 years old.   . Sinus bradycardia   . Thrombocytopenia ([place])   . Vitamin D deficiency    Past Surgical History:  Procedure Laterality Date  . CHOLECYSTECTOMY N/A 04/21/2013   Procedure: LAPAROSCOPIC CHOLECYSTECTOMY WITH INTRAOPERATIVE CHOLANGIOGRAM;  Surgeon: Haywood Lasso, MD;  Location: [facility];  Service: General;  Laterality: N/A;  . CHOLECYSTECTOMY    . COLONOSCOPY W/ POLYPECTOMY  2004 & 2009    X 2; Dr Fuller Plan; due 2014  . CORONARY ANGIOPLASTY    . CORONARY ARTERY BYPASS GRAFT  04/1999   4 vessels  . CORONARY STENT INTERVENTION N/A 12/28/2016   Procedure: CORONARY STENT INTERVENTION;  Surgeon: Lorretta Harp, MD;  Location: [facility];  Service: Cardiovascular;  Laterality: N/A;  . CORONARY STENT PLACEMENT  2012   Plavix  . LAPAROSCOPIC CHOLECYSTECTOMY  04/21/2013   Dr Margot Chimes; acutecholecystitis with necrosis  . LEFT HEART CATH AND CORS/GRAFTS ANGIOGRAPHY N/A 12/28/2016   Procedure: LEFT HEART CATH AND CORS/GRAFTS ANGIOGRAPHY;  Surgeon: Lorretta Harp, MD;  Location: [facility];  Service: Cardiovascular;  Laterality: N/A;  . LEFT HEART CATH AND CORS/GRAFTS ANGIOGRAPHY N/A 12/01/2019   Procedure:  LEFT HEART CATH AND CORS/GRAFTS ANGIOGRAPHY;  Surgeon: Martinique, Peter M, MD;  Location: [facility];  Service: Cardiovascular;  Laterality: N/A;  . VASECTOMY     Family History  Problem Relation Age of Onset  . Aortic aneurysm Father 56       ? abdominal  . Heart attack Father 54  . Diabetes Other        PGaunt  . Coronary artery disease Mother        CABG in 65s  . CVA Mother        cns bleed from warfarin  . Aneurysm Mother   . Diabetes Brother   . Cholecystitis Sister   . Cancer Neg Hx   . Colon cancer Neg Hx   . Prostate cancer Neg  Hx   . Esophageal cancer Neg Hx   . Stomach cancer Neg Hx   . Rectal cancer Neg Hx    Social History   Socioeconomic History  . Marital status: Married    Spouse name: Not on file  . Number of children: 1  . Years of education: Not on file  . Highest education level: Not on file  Occupational History  . Occupation:      Comment: plumber  Tobacco Use  . Smoking status: Never Smoker  . Smokeless tobacco: Never Used  Vaping Use  . Vaping Use: Never used  Substance and Sexual Activity  . Alcohol use: Yes    Alcohol/week: 10.0 - 12.0 standard drinks    Types: 10 - 12 Cans of beer per week    Comment: drinks beer daily; whiskey rarely, used to be daily 1-2 shots  . Drug use: No  . Sexual activity: Yes    Birth control/protection: None  Other Topics Concern  . Not on file  Social History Narrative   Remarried 2014   1 son age 56 as of 03/2017       Lives at home with spouse   Right handed   Caffeine: 1-2 cups/day (coffee, green tea)   Social Determinants of Health   Financial Resource Strain: Not on file  Food Insecurity: Not on file  Transportation Needs: Not on file  Physical Activity: Not on file  Stress: Not on file  Social Connections: Unknown  . Frequency of Communication with Friends and Family: Not on file  . Frequency of Social Gatherings with Friends and Family: Not on file  . Attends Religious Services: Not on file  . Active Member of Clubs or Organizations: Not on file  . Attends Archivist Meetings: Not on file  . Marital Status: Married  Human resources officer Violence: Not on file   Current Meds  Medication Sig  . amLODipine (NORVASC) 5 MG tablet TAKE ONE (1) TABLET EACH DAY (Patient taking differently: Take 5 mg by mouth daily at 2 am.)  . Ascorbic Acid (VITAMIN C) 1000 MG tablet Take 1,000 mg by mouth daily at 2 am. Reported on 09/26/2015  . aspirin EC 81 MG tablet Take 81 mg by mouth daily at 2 am. Swallow whole.  . cholecalciferol (VITAMIN D)  25 MCG (1000 UNIT) tablet Take 1,000 Units by mouth daily at 2 am.  . clopidogrel (PLAVIX) 75 MG tablet Take 1 tablet (75 mg total) by mouth daily. (Patient taking differently: Take 75 mg by mouth daily at 2 am.)  . Coenzyme Q10 (CO Q-10 PO) Take 1 capsule by mouth daily at 2 am.  . dicyclomine (BENTYL) 10 MG capsule Take  1 capsule (10 mg total) by mouth 3 (three) times daily with meals as needed for spasms.  Marland Kitchen ezetimibe (ZETIA) 10 MG tablet Take 1 tablet (10 mg total) by mouth daily. (Patient taking differently: Take 10 mg by mouth daily at 2 am.)  . GINKGO BILOBA PO Take 1 capsule by mouth daily at 2 am.   . isosorbide mononitrate (IMDUR) 60 MG 24 hr tablet Take 1 tablet (60 mg total) by mouth daily.  Marland Kitchen MAGNESIUM PO Take 1 tablet by mouth daily at 2 am.  . metoprolol succinate (TOPROL XL) 25 MG 24 hr tablet Take 1 tablet (25 mg total) by mouth daily. (Patient taking differently: Take 25 mg by mouth daily at 2 am.)  . Multiple Vitamin (MULTIVITAMIN WITH MINERALS) TABS tablet Take 1 tablet by mouth daily at 2 am.  . naproxen sodium (ALEVE) 220 MG tablet Take 220 mg by mouth 2 (two) times daily as needed (back pain.).  Marland Kitchen niacin 250 MG tablet Take 250 mg by mouth daily.  . nitroGLYCERIN (NITROSTAT) 0.4 MG SL tablet Place 1 tablet (0.4 mg total) under the tongue every 5 (five) minutes x 3 doses as needed.  . Nutritional Supplements (BOOST GLUCOSE CONTROL PO) Take 1 capsule by mouth daily.  . Omega-3 Fatty Acids (FISH OIL) 1000 MG CAPS Take 2,000 mg by mouth daily at 2 am.   . pantoprazole (PROTONIX) 40 MG tablet Take 1 tablet (40 mg total) by mouth 2 (two) times daily before a meal. 30 minutes. Try to reduce to 1x per day now or in 3 months (Patient taking differently: Take 40 mg by mouth daily at 2 am. 30 minutes. Try to reduce to 1x per day now or in 3 months)  . PEG-KCl-NaCl-NaSulf-Na Asc-C (PLENVU) 140 g SOLR Take 1 kit by mouth as directed. Use coupon: BIN: [id number] PNC: CNRX Group: [id number] ID:  [id number]  . ramipril (ALTACE) 10 MG capsule Take 1 capsule (10 mg total) by mouth daily. (Patient taking differently: Take 10 mg by mouth daily at 2 am.)  . rosuvastatin (CRESTOR) 40 MG tablet Take 1 tablet (40 mg total) by mouth at bedtime. (Patient taking differently: Take 40 mg by mouth daily at 2 am.)  . tamsulosin (FLOMAX) 0.4 MG CAPS capsule Take 0.4 mg by mouth daily at 2 am.   . Tdap (BOOSTRIX) 5-2.5-18.5 LF-MCG/0.5 injection Inject 0.5 mLs into the muscle once for 1 dose.  . vitamin E 100 UNIT capsule Take 100 Units by mouth daily at 2 am.    Current Facility-Administered Medications for the 06/26/20 encounter (Office Visit) with McLean-Scocuzza, Nino Glow, MD  Medication  . sodium chloride flush (NS) 0.9 % injection 3 mL   Allergies  Allergen Reactions  . Darvocet [Propoxyphene N-Acetaminophen] Hives  . Propoxyphene Hives   Recent Results (from the past 2160 hour(s))  SARS Coronavirus 2 (TAT 6-24 hrs)     Status: None   Collection Time: 06/05/20 12:00 AM  Result Value Ref Range   SARS Coronavirus 2 RESULT: NEGATIVE     Comment: RESULT: NEGATIVESARS-CoV-2 INTERPRETATION:A NEGATIVE  test result means that SARS-CoV-2 RNA was not present in the specimen above the limit of detection of this test. This does not preclude a possible SARS-CoV-2 infection and should not be used as the  sole basis for patient management decisions. Negative results must be combined with clinical observations, patient history, and epidemiological information. Optimum specimen types and timing for peak viral levels during infections caused by SARS-CoV-2  have not  been determined. Collection of multiple specimens or types of specimens may be necessary to detect virus. Improper specimen collection and handling, sequence variability under primers/probes, or organism present below the limit of detection may  lead to false negative results. Positive and negative predictive values of testing are highly dependent on  prevalence. False negative test results are more likely when prevalence of disease is high.The expected result is NEGATIVE.Fact S heet for  Healthcare Providers: LocalChronicle.no Sheet for Patients: SalonLookup.es Reference Range - Negative   SARS-CoV-2 Semi-Quantitative Total Antibody, Spike     Status: None   Collection Time: 06/24/20  8:48 AM  Result Value Ref Range   SARS-CoV-2 Semi-Quant Total Ab <0.8 Negative<0.8 U/mL    Comment: This sample does not contain detectable antibodies against the SARS-CoV-2 spike protein receptor binding domain (RBD).    SARS-CoV-2 Spike Ab Interp Negative     Comment: Roche Elecsys Anti-SARS-CoV-2 S  PSA, Medicare ( [place] only)     Status: None   Collection Time: 06/24/20  8:48 AM  Result Value Ref Range   PSA 1.72 0.10 - 4.00 ng/ml    Comment: Test performed using Access Hybritech PSA Assay, a parmagnetic partical, chemiluminecent immunoassay.  TSH     Status: None   Collection Time: 06/24/20  8:48 AM  Result Value Ref Range   TSH 1.83 0.35 - 4.50 uIU/mL  Hemoglobin A1c     Status: Abnormal   Collection Time: 06/24/20  8:48 AM  Result Value Ref Range   Hgb A1c MFr Bld 6.6 (H) 4.6 - 6.5 %    Comment: Glycemic Control Guidelines for People with Diabetes:Non Diabetic:  <6%Goal of Therapy: <7%Additional Action Suggested:  >8%   CBC with Differential/Platelet     Status: Abnormal   Collection Time: 06/24/20  8:48 AM  Result Value Ref Range   WBC 5.2 4.0 - 10.5 K/uL   RBC 4.67 4.22 - 5.81 Mil/uL   Hemoglobin 14.4 13.0 - 17.0 g/dL   HCT 42.5 39.0 - 52.0 %   MCV 91.2 78.0 - 100.0 fl   MCHC 33.9 30.0 - 36.0 g/dL   RDW 14.2 11.5 - 15.5 %   Platelets 148.0 (L) 150.0 - 400.0 K/uL   Neutrophils Relative % 68.0 43.0 - 77.0 %   Lymphocytes Relative 23.4 12.0 - 46.0 %   Monocytes Relative 7.4 3.0 - 12.0 %   Eosinophils Relative 0.8 0.0 - 5.0 %   Basophils Relative 0.4 0.0 - 3.0 %    Neutro Abs 3.6 1.4 - 7.7 K/uL   Lymphs Abs 1.2 0.7 - 4.0 K/uL   Monocytes Absolute 0.4 0.1 - 1.0 K/uL   Eosinophils Absolute 0.0 0.0 - 0.7 K/uL   Basophils Absolute 0.0 0.0 - 0.1 K/uL  Lipid panel     Status: Abnormal   Collection Time: 06/24/20  8:48 AM  Result Value Ref Range   Cholesterol 135 0 - 200 mg/dL    Comment: ATP III Classification       Desirable:  < 200 mg/dL               Borderline High:  200 - 239 mg/dL          High:  > = 240 mg/dL   Triglycerides 170.0 (H) 0.0 - 149.0 mg/dL    Comment: Normal:  <150 mg/dLBorderline High:  150 - 199 mg/dL   HDL 45.60 >39.00 mg/dL   VLDL 34.0 0.0 - 40.0 mg/dL   LDL Cholesterol 56 0 -  99 mg/dL   Total CHOL/HDL Ratio 3     Comment:                Men          Women1/2 Average Risk     3.4          3.3Average Risk          5.0          4.42X Average Risk          9.6          7.13X Average Risk          15.0          11.0                       NonHDL 89.76     Comment: NOTE:  Non-HDL goal should be 30 mg/dL higher than patient's LDL goal (i.e. LDL goal of < 70 mg/dL, would have non-HDL goal of < 100 mg/dL)  Comprehensive metabolic panel     Status: Abnormal   Collection Time: 06/24/20  8:48 AM  Result Value Ref Range   Sodium 137 135 - 145 mEq/L   Potassium 4.5 3.5 - 5.1 mEq/L   Chloride 101 96 - 112 mEq/L   CO2 28 19 - 32 mEq/L   Glucose, Bld 120 (H) 70 - 99 mg/dL   BUN 10 6 - 23 mg/dL   Creatinine, Ser 7.53 0.40 - 1.50 mg/dL   Total Bilirubin 1.0 0.2 - 1.2 mg/dL   Alkaline Phosphatase 50 39 - 117 U/L   AST 18 0 - 37 U/L   ALT 15 0 - 53 U/L   Total Protein 6.4 6.0 - 8.3 g/dL   Albumin 4.2 3.5 - 5.2 g/dL   GFR 00.51 >10.21 mL/min    Comment: Calculated using the CKD-EPI Creatinine Equation (2021)   Calcium 9.2 8.4 - 10.5 mg/dL  Urine Microalbumin w/creat. ratio     Status: None   Collection Time: 06/24/20  8:48 AM  Result Value Ref Range   Microalb, Ur 1.5 0.0 - 1.9 mg/dL   Creatinine,U 117.3 mg/dL   Microalb Creat Ratio 1.1  0.0 - 30.0 mg/g   Objective  Body mass index is 31.28 kg/m. Wt Readings from Last 3 Encounters:  06/26/20 211 lb 12.8 oz (96.1 kg)  06/06/20 208 lb (94.3 kg)  04/30/20 208 lb (94.3 kg)   Temp Readings from Last 3 Encounters:  06/26/20 98.1 F (36.7 C) (Oral)  06/06/20 97.8 F (36.6 C)  03/26/20 98 F (36.7 C) (Oral)   BP Readings from Last 3 Encounters:  06/26/20 (!) 108/58  06/06/20 138/75  04/30/20 124/78   Pulse Readings from Last 3 Encounters:  06/26/20 68  06/06/20 68  04/30/20 60    Physical Exam Vitals and nursing note reviewed.  Constitutional:      Appearance: Normal appearance. He is well-developed and well-groomed. He is obese.  HENT:     Head: Normocephalic and atraumatic.  Eyes:     Conjunctiva/sclera: Conjunctivae normal.     Pupils: Pupils are equal, round, and reactive to light.  Cardiovascular:     Rate and Rhythm: Normal rate and regular rhythm.     Heart sounds: Normal heart sounds. No murmur heard.   Pulmonary:     Effort: Pulmonary effort is normal.     Breath sounds: Normal breath sounds.  Abdominal:     General: Abdomen is  flat. Bowel sounds are normal.     Tenderness: There is no abdominal tenderness.  Skin:    General: Skin is warm and dry.  Neurological:     General: No focal deficit present.     Mental Status: He is alert and oriented to person, place, and time. Mental status is at baseline.     Gait: Gait normal.  Psychiatric:        Attention and Perception: Attention normal.        Mood and Affect: Mood and affect normal.        Speech: Speech normal.        Behavior: Behavior normal. Behavior is cooperative.        Thought Content: Thought content normal.        Cognition and Memory: Cognition and memory normal.        Judgment: Judgment normal.     Assessment  Plan  Diverticulitis Resolved  F/u GI prn  Tubular adenoma of colon  Colonoscopy 06/06/20  Repeat in 3 years Dr. Silvio Pate   HM flu shotutd -  utdzostavax, prevnar, pna 23 -Rx tdap to pharmacy sent Rx to pharmacy 06/26/20  RxTdap andshingrixmailed 02/15/2019 Consider covid 19 vaccine declines as of 06/26/20  Considerhep B vaccine not immune MMR immune -Dr. Clementeen Graham Results for MELITON, SAMAD "GENE" (MRN [id number]) as of 06/26/2020 14:42  Ref. Range 06/24/2020 08:48  PSA Latest Ref Range: 0.10 - 4.00 ng/ml 1.72   Had eye exam Dr. Kirke Corin get records Never smoker no CT chest  [place] 04/2013 abdominal aorta normal   Colonoscopy had 05/02/15 Dr. Fuller Plan sessile polyp repeat 04/2020 per report. Saw Dr. Silvio Pate 9/2019and 4/7/2020and 10/03/18 had upper endoscopy negative bx Colonoscopy had 06/06/20 tubular adenoma f/u in 3 year  saw derm 05/03/17 noted SK, lentigos, rosacea, Aks face and arms f/u in 1 year [facility]. [place].   Cards Dr. [name] f/u 04/22/20    Provider: Dr. Olivia Mackie McLean-Scocuzza-Internal Medicine

## 2020-06-29 ENCOUNTER — Other Ambulatory Visit: Payer: Self-pay | Admitting: Internal Medicine

## 2020-07-03 DIAGNOSIS — N401 Enlarged prostate with lower urinary tract symptoms: Secondary | ICD-10-CM | POA: Diagnosis not present

## 2020-07-03 DIAGNOSIS — Z125 Encounter for screening for malignant neoplasm of prostate: Secondary | ICD-10-CM | POA: Diagnosis not present

## 2020-08-24 ENCOUNTER — Other Ambulatory Visit: Payer: Self-pay | Admitting: Internal Medicine

## 2020-09-28 ENCOUNTER — Other Ambulatory Visit: Payer: Self-pay | Admitting: Internal Medicine

## 2020-09-28 DIAGNOSIS — I10 Essential (primary) hypertension: Secondary | ICD-10-CM

## 2020-10-12 ENCOUNTER — Emergency Department (HOSPITAL_COMMUNITY): Payer: Medicare Other

## 2020-10-12 ENCOUNTER — Emergency Department (HOSPITAL_COMMUNITY)
Admission: EM | Admit: 2020-10-12 | Discharge: 2020-10-13 | Disposition: A | Discharge status: Home / Self Care | Payer: Medicare Other | Attending: Emergency Medicine | Admitting: Emergency Medicine

## 2020-10-12 ENCOUNTER — Encounter (HOSPITAL_COMMUNITY): Payer: Self-pay

## 2020-10-12 ENCOUNTER — Other Ambulatory Visit: Payer: Self-pay

## 2020-10-12 DIAGNOSIS — I2510 Atherosclerotic heart disease of native coronary artery without angina pectoris: Secondary | ICD-10-CM | POA: Diagnosis not present

## 2020-10-12 DIAGNOSIS — Z79899 Other long term (current) drug therapy: Secondary | ICD-10-CM | POA: Diagnosis not present

## 2020-10-12 DIAGNOSIS — E119 Type 2 diabetes mellitus without complications: Secondary | ICD-10-CM | POA: Diagnosis not present

## 2020-10-12 DIAGNOSIS — Z7902 Long term (current) use of antithrombotics/antiplatelets: Secondary | ICD-10-CM | POA: Insufficient documentation

## 2020-10-12 DIAGNOSIS — Z7982 Long term (current) use of aspirin: Secondary | ICD-10-CM | POA: Diagnosis not present

## 2020-10-12 DIAGNOSIS — I493 Ventricular premature depolarization: Secondary | ICD-10-CM

## 2020-10-12 DIAGNOSIS — R072 Precordial pain: Secondary | ICD-10-CM | POA: Diagnosis not present

## 2020-10-12 DIAGNOSIS — R079 Chest pain, unspecified: Secondary | ICD-10-CM | POA: Diagnosis not present

## 2020-10-12 DIAGNOSIS — I10 Essential (primary) hypertension: Secondary | ICD-10-CM | POA: Insufficient documentation

## 2020-10-12 DIAGNOSIS — Z8673 Personal history of transient ischemic attack (TIA), and cerebral infarction without residual deficits: Secondary | ICD-10-CM | POA: Insufficient documentation

## 2020-10-12 DIAGNOSIS — R0789 Other chest pain: Secondary | ICD-10-CM | POA: Diagnosis not present

## 2020-10-12 DIAGNOSIS — Z951 Presence of aortocoronary bypass graft: Secondary | ICD-10-CM | POA: Diagnosis not present

## 2020-10-12 LAB — TROPONIN I (HIGH SENSITIVITY)
Troponin I (High Sensitivity): 5 ng/L (ref <18)
Troponin I (High Sensitivity): 6 ng/L (ref <18)

## 2020-10-12 LAB — CBC
HCT: 41.7 % (ref 39.0–52.0)
Hemoglobin: 14.3 g/dL (ref 13.0–17.0)
MCH: 31.3 pg (ref 26.0–34.0)
MCHC: 34.3 g/dL (ref 30.0–36.0)
MCV: 91.2 fL (ref 80.0–100.0)
Platelets: 141 10*3/uL — ABNORMAL LOW (ref 150–400)
RBC: 4.57 MIL/uL (ref 4.22–5.81)
RDW: 13.5 % (ref 11.5–15.5)
WBC: 4.7 10*3/uL (ref 4.0–10.5)
nRBC: 0 % (ref 0.0–0.2)

## 2020-10-12 LAB — BASIC METABOLIC PANEL
Anion gap: 10 (ref 5–15)
BUN: 7 mg/dL — ABNORMAL LOW (ref 8–23)
CO2: 22 mmol/L (ref 22–32)
Calcium: 8.7 mg/dL — ABNORMAL LOW (ref 8.9–10.3)
Chloride: 105 mmol/L (ref 98–111)
Creatinine, Ser: 0.81 mg/dL (ref 0.61–1.24)
GFR, Estimated: >60 mL/min (ref >60)
Glucose, Bld: 143 mg/dL — ABNORMAL HIGH (ref 70–99)
Potassium: 3.8 mmol/L (ref 3.5–5.1)
Sodium: 137 mmol/L (ref 135–145)

## 2020-10-12 NOTE — ED Provider Notes (Signed)
[facility] EMERGENCY DEPARTMENT Provider Note   CSN: [id number] Arrival date & time: 10/12/20  1933     History Chief Complaint  Patient presents with  . Chest Pain    RAJAN Webb is a 75 y.o. male.  The history is provided by the patient.  Chest Pain Pain location:  Substernal area Pain quality: dull   Pain radiates to:  Does not radiate Pain severity:  Moderate Onset quality:  Gradual Timing:  Constant Progression:  Unchanged Chronicity:  New Context: at rest   Relieved by:  Nothing Worsened by:  Nothing Ineffective treatments:  None tried Associated symptoms: no abdominal pain, no AICD problem, no altered mental status, no anorexia, no anxiety, no back pain, no claudication, no cough, no diaphoresis, no dizziness, no dysphagia, no fatigue, no fever, no headache, no heartburn, no lower extremity edema, no nausea, no near-syncope, no numbness, no orthopnea, no palpitations, no PND, no shortness of breath, no syncope, no vomiting and no weakness   Associated symptoms comment:  Pvcs Risk factors: male sex        Past Medical History:  Diagnosis Date  . Acute cholecystitis 04/18/2013  . Adenomatous colon polyp 12/2004  . Anemia   . Blood transfusion without reported diagnosis   . BPH associated with nocturia   . CAD (coronary artery disease)    a. s/p CABG x4 in 2000 with LIMA-LAD, reverse SVG-IM, reverse SVG-acute margin and reverse SVG-RCA b. s/p BMS to SVG-RI in 2012 c. 12/2016: PCI/DES to SVG-RCA and PCI/DES to SVG-RI (Dr. [name])   . Carotid artery stenosis    mild   . Cataract   . Clotting disorder ([place])   . Diabetes mellitus without complication ([place]) 02/54/2706   recent A1C 6.1 12/25/16 controlled with diet and exercise   . Diverticulitis 2008/2009  . GERD (gastroesophageal reflux disease)    controlled as of 04/01/17   . HSV infection   . HTN (hypertension)   . Hx of dysplastic nevus 05/04/2018   upper back spinal   .  Hyperglycemia   . Hyperlipidemia   . Impingement syndrome of left shoulder 2016  . Low testosterone    Dr [name], [place] ,[place]  . Myocardial infarction ([place])   . Osteoarthritis of right knee    With trace effusion  . Peyronie disease   . RLS (restless legs syndrome) 08/15/2013  . Rotator cuff injury    right; at 72-40 years old.   . Sinus bradycardia   . Thrombocytopenia ([place])   . Vitamin D deficiency     Patient Active Problem List   Diagnosis Date Noted  . Tubular adenoma of colon 06/26/2020  . Dizziness 04/21/2020  . Cervical radiculopathy 03/26/2020  . Hypertension associated with diabetes ([place]) 03/26/2020  . Right arm weakness 02/02/2020  . Left carotid artery stenosis 02/02/2020  . TIA (transient ischemic attack) 01/19/2020  . Unstable angina ([place]) 12/01/2019  . Piriformis syndrome of right side 09/22/2019  . Obesity (BMI 30-39.9) 09/22/2019  . Aortic atherosclerosis ([place]) 06/20/2019  . Diverticulosis 06/20/2019  . Kidney cysts 06/20/2019  . Low back pain 02/15/2019  . Educated about COVID-19 virus infection 10/06/2018  . Prediabetes 03/29/2018  . Cervicalgia 03/29/2018  . Prostatitis 12/23/2017  . Vitamin D deficiency   . Sinus bradycardia   . Peyronie disease   . Osteoarthritis of right knee   . Hyperlipidemia   . Gastroesophageal reflux disease   . Diverticulitis   . Coronary artery disease involving native  coronary artery of native heart without angina pectoris   . BPH associated with nocturia   . Anemia   . Hyperlipidemia LDL goal <70 01/08/2017  . NSTEMI (non-ST elevated myocardial infarction) ([place]) 12/25/2016  . Type 2 diabetes mellitus without complication, without long-term current use of insulin ([place]) 09/28/2016  . Impingement syndrome of left shoulder 05/18/2014  . RLS (restless legs syndrome) 08/15/2013  . Overweight(278.02) 11/28/2010  . GERD 02/22/2007  . TESTOSTERONE DEFICIENCY 09/30/2006  . HTN (hypertension) 09/30/2006  . Coronary  atherosclerosis of artery bypass graft 09/30/2006  . COLONIC POLYPS, HX OF 09/30/2006  . Adenomatous colon polyp 12/16/2004    Past Surgical History:  Procedure Laterality Date  . CHOLECYSTECTOMY N/A 04/21/2013   Procedure: LAPAROSCOPIC CHOLECYSTECTOMY WITH INTRAOPERATIVE CHOLANGIOGRAM;  Surgeon: Haywood Lasso, MD;  Location: [facility];  Service: General;  Laterality: N/A;  . CHOLECYSTECTOMY    . COLONOSCOPY W/ POLYPECTOMY  2004 & 2009    X 2; Dr Fuller Plan; due 2014  . CORONARY ANGIOPLASTY    . CORONARY ARTERY BYPASS GRAFT  04/1999   4 vessels  . CORONARY STENT INTERVENTION N/A 12/28/2016   Procedure: CORONARY STENT INTERVENTION;  Surgeon: Lorretta Harp, MD;  Location: [facility];  Service: Cardiovascular;  Laterality: N/A;  . CORONARY STENT PLACEMENT  2012   Plavix  . LAPAROSCOPIC CHOLECYSTECTOMY  04/21/2013   Dr Margot Chimes; acutecholecystitis with necrosis  . LEFT HEART CATH AND CORS/GRAFTS ANGIOGRAPHY N/A 12/28/2016   Procedure: LEFT HEART CATH AND CORS/GRAFTS ANGIOGRAPHY;  Surgeon: Lorretta Harp, MD;  Location: [facility];  Service: Cardiovascular;  Laterality: N/A;  . LEFT HEART CATH AND CORS/GRAFTS ANGIOGRAPHY N/A 12/01/2019   Procedure: LEFT HEART CATH AND CORS/GRAFTS ANGIOGRAPHY;  Surgeon: Martinique, Peter M, MD;  Location: [facility];  Service: Cardiovascular;  Laterality: N/A;  . VASECTOMY         Family History  Problem Relation Age of Onset  . Aortic aneurysm Father 40       ? abdominal  . Heart attack Father 29  . Diabetes Other        PGaunt  . Coronary artery disease Mother        CABG in 60s  . CVA Mother        cns bleed from warfarin  . Aneurysm Mother   . Diabetes Brother   . Cholecystitis Sister   . Cancer Neg Hx   . Colon cancer Neg Hx   . Prostate cancer Neg Hx   . Esophageal cancer Neg Hx   . Stomach cancer Neg Hx   . Rectal cancer Neg Hx     Social History   Tobacco Use  . Smoking status: Never Smoker  . Smokeless  tobacco: Never Used  Vaping Use  . Vaping Use: Never used  Substance Use Topics  . Alcohol use: Yes    Alcohol/week: 10.0 - 12.0 standard drinks    Types: 10 - 12 Cans of beer per week    Comment: drinks beer daily; whiskey rarely, used to be daily 1-2 shots  . Drug use: No    Home Medications Prior to Admission medications   Medication Sig Start Date End Date Taking? Authorizing Provider  amLODipine (NORVASC) 5 MG tablet TAKE 1 TABLET BY MOUTH DAILY 09/30/20   McLean-Scocuzza, Nino Glow, MD  Ascorbic Acid (VITAMIN C) 1000 MG tablet Take 1,000 mg by mouth daily at 2 am. Reported on 09/26/2015    [provider]  aspirin EC 81 MG tablet Take 81 mg by mouth daily at 2 am. Swallow whole.    [provider]  cholecalciferol (VITAMIN D) 25 MCG (1000 UNIT) tablet Take 1,000 Units by mouth daily at 2 am.    [provider]  clopidogrel (PLAVIX) 75 MG tablet TAKE 1 TABLET BY MOUTH DAILY 08/26/20   McLean-Scocuzza, Nino Glow, MD  Coenzyme Q10 (CO Q-10 PO) Take 1 capsule by mouth daily at 2 am.    [provider]  dicyclomine (BENTYL) 10 MG capsule Take 1 capsule (10 mg total) by mouth 3 (three) times daily with meals as needed for spasms. 06/06/20   Ladene Artist, MD  ezetimibe (ZETIA) 10 MG tablet Take 1 tablet (10 mg total) by mouth daily. Patient taking differently: Take 10 mg by mouth daily at 2 am. 09/22/19   McLean-Scocuzza, Nino Glow, MD  GINKGO BILOBA PO Take 1 capsule by mouth daily at 2 am.     [provider]  isosorbide mononitrate (IMDUR) 60 MG 24 hr tablet Take 1 tablet (60 mg total) by mouth daily. 12/18/19   Minus Breeding, MD  MAGNESIUM PO Take 1 tablet by mouth daily at 2 am.    [provider]  metoprolol succinate (TOPROL-XL) 25 MG 24 hr tablet TAKE 1 TABLET BY MOUTH DAILY 06/30/20   McLean-Scocuzza, Nino Glow, MD  Multiple Vitamin (MULTIVITAMIN WITH MINERALS) TABS tablet Take 1 tablet by mouth daily at 2 am.    [provider]   naproxen sodium (ALEVE) 220 MG tablet Take 220 mg by mouth 2 (two) times daily as needed (back pain.).    [provider]  niacin 250 MG tablet Take 250 mg by mouth daily.    [provider]  nitroGLYCERIN (NITROSTAT) 0.4 MG SL tablet Place 1 tablet (0.4 mg total) under the tongue every 5 (five) minutes x 3 doses as needed. 09/22/19   McLean-Scocuzza, Nino Glow, MD  Nutritional Supplements (BOOST GLUCOSE CONTROL PO) Take 1 capsule by mouth daily.    [provider]  Omega-3 Fatty Acids (FISH OIL) 1000 MG CAPS Take 2,000 mg by mouth daily at 2 am.     [provider]  pantoprazole (PROTONIX) 40 MG tablet Take 1 tablet (40 mg total) by mouth 2 (two) times daily before a meal. 30 minutes. Try to reduce to 1x per day now or in 3 months Patient taking differently: Take 40 mg by mouth daily at 2 am. 30 minutes. Try to reduce to 1x per day now or in 3 months 09/22/19   McLean-Scocuzza, Nino Glow, MD  PEG-KCl-NaCl-NaSulf-Na Asc-C (PLENVU) 140 g SOLR Take 1 kit by mouth as directed. Use coupon: BIN: 664403 PNC: CNRX Group: [id number] ID: [id number] 04/30/20   Ladene Artist, MD  ramipril (ALTACE) 10 MG capsule TAKE 1 CAPSULE BY MOUTH ONCE DAILY 09/30/20   McLean-Scocuzza, Nino Glow, MD  rosuvastatin (CRESTOR) 40 MG tablet Take 1 tablet (40 mg total) by mouth at bedtime. Patient taking differently: Take 40 mg by mouth daily at 2 am. 09/22/19   McLean-Scocuzza, Nino Glow, MD  tamsulosin (FLOMAX) 0.4 MG CAPS capsule Take 0.4 mg by mouth daily at 2 am.     [provider]  vitamin E 100 UNIT capsule Take 100 Units by mouth daily at 2 am.     [provider]    Allergies    Darvocet [propoxyphene n-acetaminophen] and Propoxyphene  Review of Systems   Review of Systems  Constitutional:  Negative for diaphoresis, fatigue and fever.  HENT: Negative for trouble swallowing.   Eyes: Negative for redness.  Respiratory: Negative for cough and shortness of breath.    Cardiovascular: Positive for chest pain. Negative for palpitations, orthopnea, claudication, syncope, PND and near-syncope.  Gastrointestinal: Negative for abdominal pain, anorexia, heartburn, nausea and vomiting.  Genitourinary: Negative for flank pain.  Musculoskeletal: Negative for back pain.  Skin: Negative for rash.  Neurological: Negative for dizziness, weakness, numbness and headaches.  Psychiatric/Behavioral: Negative for agitation.  All other systems reviewed and are negative.   Physical Exam Updated Vital Signs BP (!) 145/76   Pulse 61   Temp 98.2 F (36.8 C)   Resp 19   Ht _0  (1.753 m)   Wt 96.1 kg   SpO2 96%   BMI 31.29 kg/m   Physical Exam Vitals and nursing note reviewed.  Constitutional:      General: He is not in acute distress.    Appearance: Normal appearance.  HENT:     Head: Normocephalic and atraumatic.     Nose: Nose normal.  Eyes:     Conjunctiva/sclera: Conjunctivae normal.     Pupils: Pupils are equal, round, and reactive to light.  Cardiovascular:     Rate and Rhythm: Normal rate and regular rhythm.     Pulses: Normal pulses.     Heart sounds: Normal heart sounds.  Pulmonary:     Effort: Pulmonary effort is normal.     Breath sounds: Normal breath sounds.  Abdominal:     General: Abdomen is flat. Bowel sounds are normal.     Palpations: Abdomen is soft.     Tenderness: There is no abdominal tenderness. There is no guarding.  Musculoskeletal:        General: Normal range of motion.     Cervical back: Normal range of motion and neck supple.  Skin:    General: Skin is warm and dry.     Capillary Refill: Capillary refill takes less than 2 seconds.  Neurological:     General: No focal deficit present.     Mental Status: He is alert and oriented to person, place, and time.     Deep Tendon Reflexes: Reflexes normal.  Psychiatric:        Mood and Affect: Mood normal.        Behavior: Behavior normal.     ED Results / Procedures /  Treatments   Labs (all labs ordered are listed, but only abnormal results are displayed) Results for orders placed or performed during the hospital encounter of 85/90/93  Basic metabolic panel  Result Value Ref Range   Sodium 137 135 - 145 mmol/L   Potassium 3.8 3.5 - 5.1 mmol/L   Chloride 105 98 - 111 mmol/L   CO2 22 22 - 32 mmol/L   Glucose, Bld 143 (H) 70 - 99 mg/dL   BUN 7 (L) 8 - 23 mg/dL   Creatinine, Ser 0.81 0.61 - 1.24 mg/dL   Calcium 8.7 (L) 8.9 - 10.3 mg/dL   GFR, Estimated >60 >60 mL/min   Anion gap 10 5 - 15  CBC  Result Value Ref Range   WBC 4.7 4.0 - 10.5 K/uL   RBC 4.57 4.22 - 5.81 MIL/uL   Hemoglobin 14.3 13.0 - 17.0 g/dL   HCT 41.7 39.0 - 52.0 %   MCV 91.2 80.0 - 100.0 fL   MCH 31.3 26.0 - 34.0 pg   MCHC 34.3 30.0 - 36.0 g/dL  RDW 13.5 11.5 - 15.5 %   Platelets 141 (L) 150 - 400 K/uL   nRBC 0.0 0.0 - 0.2 %  Troponin I (High Sensitivity)  Result Value Ref Range   Troponin I (High Sensitivity) 5 <18 ng/L  Troponin I (High Sensitivity)  Result Value Ref Range   Troponin I (High Sensitivity) 6 <18 ng/L   DG Chest 2 View  Result Date: 10/12/2020 CLINICAL DATA:  Chest pain for few days EXAM: CHEST - 2 VIEW COMPARISON:  04/28/2017 FINDINGS: Cardiac shadow is within normal limits. Postsurgical changes are noted. Lungs are well aerated bilaterally. No focal infiltrate or sizable effusion is seen. Mild degenerative change of the thoracic spine is noted. IMPRESSION: No acute abnormality noted. Electronically Signed   By: Inez Catalina M.D.   On: 10/12/2020 20:04    EKG EKG Interpretation  Date/Time:  Saturday Oct 12 2020 19:40:48 EDT Ventricular Rate:  70 PR Interval:  140 QRS Duration: 152 QT Interval:  440 QTC Calculation: 475 R Axis:   -58 Text Interpretation: Sinus rhythm with Premature supraventricular complexes in a pattern of bigeminy Left axis deviation Right bundle branch block Abnormal ECG No significant change since last tracing Confirmed by Theotis Burrow [phone]) on 10/12/2020 10:10:51 PM   Radiology DG Chest 2 View  Result Date: 10/12/2020 CLINICAL DATA:  Chest pain for few days EXAM: CHEST - 2 VIEW COMPARISON:  04/28/2017 FINDINGS: Cardiac shadow is within normal limits. Postsurgical changes are noted. Lungs are well aerated bilaterally. No focal infiltrate or sizable effusion is seen. Mild degenerative change of the thoracic spine is noted. IMPRESSION: No acute abnormality noted. Electronically Signed   By: Inez Catalina M.D.   On: 10/12/2020 20:04    Procedures Procedures   Medications Ordered in ED Medications - No data to display  ED Course  I have reviewed the triage vital signs and the nursing notes.  Pertinent labs & imaging results that were available during my care of the patient were reviewed by me and considered in my medical decision making (see chart for details).   Patient is resting comfortably in the room and as the pain has been going on for days, likely secondary to worry over his BPS that he states are elevated patient is safe for discharge with close follow up as he has ruled out for MI in the ED.     12 am: Case d/w cardiology Dr. Jaynie Collins.  Has had this in the past.  Call Monday to be seen in the office.  May be discharged.    Kevin Webb was evaluated in Emergency Department on 10/12/2020 for the symptoms described in the history of present illness. He was evaluated in the context of the global COVID-19 pandemic, which necessitated consideration that the patient might be at risk for infection with the SARS-CoV-2 virus that causes COVID-19. Institutional protocols and algorithms that pertain to the evaluation of patients at risk for COVID-19 are in a state of rapid change based on information released by regulatory bodies including the CDC and federal and state organizations. These policies and algorithms were followed during the patient's care in the ED.  Final Clinical Impression(s) / ED  Diagnoses Return for intractable cough, coughing up blood, fevers >100.4 unrelieved by medication, shortness of breath, intractable vomiting, chest pain, shortness of breath, weakness, numbness, changes in speech, facial asymmetry, abdominal pain, passing out, Inability to tolerate liquids or food, cough, altered mental status or any concerns. No signs of systemic illness or  infection. The patient is nontoxic-appearing on exam and vital signs are within normal limits.  I have reviewed the triage vital signs and the nursing notes. Pertinent labs & imaging results that were available during my care of the patient were reviewed by me and considered in my medical decision making (see chart for details). After history, exam, and medical workup I feel the patient has been appropriately medically screened and is safe for discharge home. Pertinent diagnoses were discussed with the patient. Patient was given return precautions.    [place], Nakeesha Bowler, MD 10/13/20 [phone]

## 2020-10-12 NOTE — ED Triage Notes (Signed)
Patient with chest pain x a few days, reports this afternoon, he broke out in a sweat and the pressure and pain got worse, hx of CABG and stents

## 2020-10-13 NOTE — ED Notes (Signed)
E-signature pad unavailable at time of pt discharge. This RN discussed discharge materials with pt and answered all pt questions. Pt stated understanding of discharge material. ? ?

## 2020-11-16 ENCOUNTER — Other Ambulatory Visit: Payer: Self-pay | Admitting: Internal Medicine

## 2020-11-16 DIAGNOSIS — E785 Hyperlipidemia, unspecified: Secondary | ICD-10-CM

## 2020-11-21 ENCOUNTER — Other Ambulatory Visit: Payer: Self-pay | Admitting: Internal Medicine

## 2020-11-21 DIAGNOSIS — E785 Hyperlipidemia, unspecified: Secondary | ICD-10-CM

## 2020-11-21 DIAGNOSIS — E119 Type 2 diabetes mellitus without complications: Secondary | ICD-10-CM

## 2020-11-22 ENCOUNTER — Ambulatory Visit: Payer: Medicare Other

## 2020-11-27 ENCOUNTER — Ambulatory Visit (INDEPENDENT_AMBULATORY_CARE_PROVIDER_SITE_OTHER): Payer: Medicare Other

## 2020-11-27 VITALS — Ht 69.0 in | Wt 211.0 lb

## 2020-11-27 DIAGNOSIS — Z0000 Encounter for general adult medical examination without abnormal findings: Secondary | ICD-10-CM

## 2020-11-27 NOTE — Progress Notes (Signed)
Subjective:   Kevin Webb is a 75 y.o. male who presents for Medicare Annual/Subsequent preventive examination.  Review of Systems     Cardiac Risk Factors include: advanced age (>67mn, >>78women);male gender;hypertension;diabetes mellitusNo ROS.  Medicare Wellness Virtual Visit.  Visual/audio telehealth visit, UTA vital signs.   See social history for additional risk factors.       Objective:    Today's Vitals   11/27/20 1552  Weight: 211 lb (95.7 kg)  Height: _0  (1.753 m)   Body mass index is 31.16 kg/m.  Advanced Directives 11/27/2020 10/12/2020 01/20/2020 12/01/2019 11/22/2019 11/15/2018 11/11/2017  Does Patient Have a Medical Advance Directive? Yes No No Yes Yes Yes Yes  Type of AParamedicof AAshburnLiving will - - [place]SlaterLiving will HPilot KnobLiving will Living will;Healthcare Power of [place] will  Does patient want to make changes to medical advance directive? No - Patient declined - - No - Patient declined No - Patient declined No - Patient declined No - Patient declined  Copy of [place] Chart? Yes - validated most recent copy scanned in chart (See row information) - - No - copy requested Yes - validated most recent copy scanned in chart (See row information) Yes - validated most recent copy scanned in chart (See row information) Yes  Would patient like information on creating a medical advance directive? - No - Patient declined No - Patient declined - - - -    Current Medications (verified) Outpatient Encounter Medications as of 11/27/2020  Medication Sig   amLODipine (NORVASC) 5 MG tablet TAKE 1 TABLET BY MOUTH DAILY   Ascorbic Acid (VITAMIN C) 1000 MG tablet Take 1,000 mg by mouth daily at 2 am. Reported on 09/26/2015   aspirin EC 81 MG tablet Take 81 mg by mouth daily at 2 am. Swallow whole.   cholecalciferol (VITAMIN D) 25 MCG (1000 UNIT)  tablet Take 1,000 Units by mouth daily at 2 am.   clopidogrel (PLAVIX) 75 MG tablet TAKE 1 TABLET BY MOUTH DAILY   Coenzyme Q10 (CO Q-10 PO) Take 1 capsule by mouth daily at 2 am.   dicyclomine (BENTYL) 10 MG capsule Take 1 capsule (10 mg total) by mouth 3 (three) times daily with meals as needed for spasms.   ezetimibe (ZETIA) 10 MG tablet TAKE 1 TABLET BY MOUTH DAILY   GINKGO BILOBA PO Take 1 capsule by mouth daily at 2 am.    isosorbide mononitrate (IMDUR) 60 MG 24 hr tablet Take 1 tablet (60 mg total) by mouth daily.   MAGNESIUM PO Take 1 tablet by mouth daily at 2 am.   metoprolol succinate (TOPROL-XL) 25 MG 24 hr tablet TAKE 1 TABLET BY MOUTH DAILY   Multiple Vitamin (MULTIVITAMIN WITH MINERALS) TABS tablet Take 1 tablet by mouth daily at 2 am.   naproxen sodium (ALEVE) 220 MG tablet Take 220 mg by mouth 2 (two) times daily as needed (back pain.).   niacin 250 MG tablet Take 250 mg by mouth daily.   nitroGLYCERIN (NITROSTAT) 0.4 MG SL tablet Place 1 tablet (0.4 mg total) under the tongue every 5 (five) minutes x 3 doses as needed.   Nutritional Supplements (BOOST GLUCOSE CONTROL PO) Take 1 capsule by mouth daily.   Omega-3 Fatty Acids (FISH OIL) 1000 MG CAPS Take 2,000 mg by mouth daily at 2 am.    pantoprazole (PROTONIX) 40 MG tablet Take 1 tablet (  40 mg total) by mouth 2 (two) times daily before a meal. 30 minutes. Try to reduce to 1x per day now or in 3 months (Patient taking differently: Take 40 mg by mouth daily at 2 am. 30 minutes. Try to reduce to 1x per day now or in 3 months)   PEG-KCl-NaCl-NaSulf-Na Asc-C (PLENVU) 140 g SOLR Take 1 kit by mouth as directed. Use coupon: BIN: 149702 PNC: CNRX Group: [id number] ID: [id number]   ramipril (ALTACE) 10 MG capsule TAKE 1 CAPSULE BY MOUTH ONCE DAILY   rosuvastatin (CRESTOR) 40 MG tablet TAKE 1 TABLET AT BEDTIME   tamsulosin (FLOMAX) 0.4 MG CAPS capsule Take 0.4 mg by mouth daily at 2 am.    vitamin E 100 UNIT capsule Take 100 Units by  mouth daily at 2 am.    Facility-Administered Encounter Medications as of 11/27/2020  Medication   sodium chloride flush (NS) 0.9 % injection 3 mL    Allergies (verified) Darvocet [propoxyphene n-acetaminophen] and Propoxyphene   History: Past Medical History:  Diagnosis Date   Acute cholecystitis 04/18/2013   Adenomatous colon polyp 12/2004   Anemia    Blood transfusion without reported diagnosis    BPH associated with nocturia    CAD (coronary artery disease)    a. s/p CABG x4 in 2000 with LIMA-LAD, reverse SVG-IM, reverse SVG-acute margin and reverse SVG-RCA b. s/p BMS to SVG-RI in 2012 c. 12/2016: PCI/DES to SVG-RCA and PCI/DES to SVG-RI (Dr. [name])    Carotid artery stenosis    mild    Cataract    Clotting disorder ([place])    Diabetes mellitus without complication ([place]) 72/01/4708   recent A1C 6.1 12/25/16 controlled with diet and exercise    Diverticulitis 2008/2009   GERD (gastroesophageal reflux disease)    controlled as of 04/01/17    HSV infection    HTN (hypertension)    Hx of dysplastic nevus 05/04/2018   upper back spinal    Hyperglycemia    Hyperlipidemia    Impingement syndrome of left shoulder 2016   Low testosterone    Dr [name], [place] ,[place]   Myocardial infarction [facility])    Osteoarthritis of right knee    With trace effusion   Peyronie disease    RLS (restless legs syndrome) 08/15/2013   Rotator cuff injury    right; at 32-66 years old.    Sinus bradycardia    Thrombocytopenia (HCC)    Vitamin D deficiency    Past Surgical History:  Procedure Laterality Date   CHOLECYSTECTOMY N/A 04/21/2013   Procedure: LAPAROSCOPIC CHOLECYSTECTOMY WITH INTRAOPERATIVE CHOLANGIOGRAM;  Surgeon: Haywood Lasso, MD;  Location: [facility];  Service: General;  Laterality: N/A;   CHOLECYSTECTOMY     COLONOSCOPY W/ POLYPECTOMY  2004 & 2009    X 2; Dr Fuller Plan; due 2014   CORONARY ANGIOPLASTY     CORONARY ARTERY BYPASS GRAFT  04/1999   4 vessels   CORONARY STENT  INTERVENTION N/A 12/28/2016   Procedure: CORONARY STENT INTERVENTION;  Surgeon: Lorretta Harp, MD;  Location: [facility];  Service: Cardiovascular;  Laterality: N/A;   CORONARY STENT PLACEMENT  2012   Plavix   LAPAROSCOPIC CHOLECYSTECTOMY  04/21/2013   Dr Margot Chimes; acutecholecystitis with necrosis   LEFT HEART CATH AND CORS/GRAFTS ANGIOGRAPHY N/A 12/28/2016   Procedure: LEFT HEART CATH AND CORS/GRAFTS ANGIOGRAPHY;  Surgeon: Lorretta Harp, MD;  Location: [facility];  Service: Cardiovascular;  Laterality: N/A;   LEFT HEART CATH AND CORS/GRAFTS ANGIOGRAPHY  N/A 12/01/2019   Procedure: LEFT HEART CATH AND CORS/GRAFTS ANGIOGRAPHY;  Surgeon: Martinique, Peter M, MD;  Location: [facility];  Service: Cardiovascular;  Laterality: N/A;   VASECTOMY     Family History  Problem Relation Age of Onset   Aortic aneurysm Father 71       ? abdominal   Heart attack Father 72   Diabetes Other        PGaunt   Coronary artery disease Mother        CABG in 24s   CVA Mother        cns bleed from warfarin   Aneurysm Mother    Diabetes Brother    Cholecystitis Sister    Cancer Neg Hx    Colon cancer Neg Hx    Prostate cancer Neg Hx    Esophageal cancer Neg Hx    Stomach cancer Neg Hx    Rectal cancer Neg Hx    Social History   Socioeconomic History   Marital status: Married    Spouse name: Not on file   Number of children: 1   Years of education: Not on file   Highest education level: Not on file  Occupational History   Occupation:      Comment: plumber  Tobacco Use   Smoking status: Never   Smokeless tobacco: Never  Vaping Use   Vaping Use: Never used  Substance and Sexual Activity   Alcohol use: Yes    Alcohol/week: 10.0 - 12.0 standard drinks    Types: 10 - 12 Cans of beer per week    Comment: drinks beer daily; whiskey rarely, used to be daily 1-2 shots   Drug use: No   Sexual activity: Yes    Birth control/protection: None  Other Topics Concern   Not on file   Social History Narrative   Remarried 2014   1 son age 65 as of 03/2017       Lives at home with spouse   Right handed   Caffeine: 1-2 cups/day (coffee, green tea)   Social Determinants of Health   Financial Resource Strain: Low Risk    Difficulty of Paying Living Expenses: Not hard at all  Food Insecurity: No Food Insecurity   Worried About Charity fundraiser in the Last Year: Never true   Ran Out of Food in the Last Year: Never true  Transportation Needs: No Transportation Needs   Lack of Transportation (Medical): No   Lack of Transportation (Non-Medical): No  Physical Activity: Sufficiently Active   Days of Exercise per Week: 7 days   Minutes of Exercise per Session: 60 min  Stress: No Stress Concern Present   Feeling of Stress : Not at all  Social Connections: Unknown   Frequency of Communication with Friends and Family: Not on file   Frequency of Social Gatherings with Friends and Family: Not on file   Attends Religious Services: Not on file   Active Member of Clubs or Organizations: Not on file   Attends Archivist Meetings: Not on file   Marital Status: Married    Tobacco Counseling Counseling given: Not Answered   Clinical Intake:  Pre-visit preparation completed: Yes        Diabetes: Yes (Followed by pcp.). Patient notes monitors at home routinely and stays within normal range.   How often do you need to have someone help you when you read instructions, pamphlets, or other written materials from your doctor or pharmacy?: 1 -  Never  Financial Strains and Diabetes Management: Are you having any financial strains with the device, your supplies or your medication? No .  Does the patient want to be seen by Chronic Care Management for management of their diabetes?  No  Would the patient like to be referred to a Nutritionist or for Diabetic Management?  No    Activities of Daily Living In your present state of health, do you have any difficulty  performing the following activities: 11/27/2020 01/20/2020  Hearing? N N  Vision? N Y  Difficulty concentrating or making decisions? N N  Walking or climbing stairs? N Y  Dressing or bathing? N N  Doing errands, shopping? N N  Preparing Food and eating ? N -  Using the Toilet? N -  In the past six months, have you accidently leaked urine? N -  Comment Followed by Urology -  Do you have problems with loss of bowel control? N -  Managing your Medications? N -  Managing your Finances? N -  Housekeeping or managing your Housekeeping? N -  Some recent data might be hidden    Patient Care Team: McLean-Scocuzza, Nino Glow, MD as PCP - General (Internal Medicine) Minus Breeding, MD as PCP - Cardiology (Cardiology) Minus Breeding, MD as Consulting Physician (Cardiology) Royston Cowper, MD as Consulting Physician (Urology) Ladene Artist, MD as Consulting Physician (Gastroenterology)  Indicate any recent Medical Services you may have received from other than [facility] providers in the past year (date may be approximate).     Assessment:   This is a routine wellness examination for [place].  I connected with Wynonia Lawman today by telephone and verified that I am speaking with the correct person using two identifiers. Location patient: home Location provider: work Persons participating in the virtual visit: patient, Marine scientist.    I discussed the limitations, risks, security and privacy concerns of performing an evaluation and management service by telephone and the availability of in person appointments. The patient expressed understanding and verbally consented to this telephonic visit.    Interactive audio and video telecommunications were attempted between this provider and patient, however failed, due to patient having technical difficulties OR patient did not have access to video capability.  We continued and completed visit with audio only.  Some vital signs may be absent or patient reported.    Hearing/Vision screen Hearing Screening - Comments:: Patient is able to hear conversational tones without difficulty.  No issues reported.   Vision Screening - Comments:: Wears corrective lenses They have seen their ophthalmologist in the last 12 months.    Dietary issues and exercise activities discussed: Current Exercise Habits: Home exercise routine Healthy diet    Goals Addressed               This Visit's Progress     Patient Stated     Follow up with Primary Care Provider as needed (pt-stated)        As needed        Depression Screen PHQ 2/9 Scores 11/27/2020 11/22/2019 09/22/2019 11/15/2018 12/23/2017 11/11/2017 09/28/2016  PHQ - 2 Score 0 0 0 0 0 0 0    ASSISTIVE DEVICES UTILIZED TO PREVENT FALLS:  Life alert? No  Use of a cane, walker or w/c? No   TIMED UP AND GO: Was the test performed? No .   Cognitive Function: Patient is alert.  Denies difficulty focusing, making decisions, memory loss.  MMSE deferred per patient.   MMSE - Mini Mental  State Exam 11/11/2017  Orientation to time 5  Orientation to Place 5  Registration 3  Attention/ Calculation 5  Recall 2  Language- name 2 objects 2  Language- repeat 1  Language- follow 3 step command 3  Language- read & follow direction 1  Write a sentence 1  Copy design 1  Total score 29     6CIT Screen 11/15/2018  What Year? 0 points  What month? 0 points  What time? 0 points  Count back from 20 0 points  Months in reverse 0 points  Repeat phrase 0 points  Total Score 0   Shingrix vaccine- deferred per patient for more discussion with pcp.  Immunizations Immunization History  Administered Date(s) Administered   Fluad Quad(high Dose 65+) 03/26/2020   Influenza, High Dose Seasonal PF 01/25/2015, 03/30/2016, 04/01/2017, 03/29/2018   Influenza,inj,Quad PF,6+ Mos 04/25/2014   Influenza,inj,quad, With Preservative 02/15/2018   Influenza-Unspecified 03/19/2019   Pneumococcal Conjugate-13 09/27/2015    Pneumococcal Polysaccharide-23 04/25/2014   Td 08/07/2008   Tdap 06/26/2020   Zoster, Live 03/21/2014   Health Maintenance Health Maintenance  Topic Date Due   Zoster Vaccines- Shingrix (1 of 2) Never done   OPHTHALMOLOGY EXAM  11/27/2020 (Originally 07/28/2018)   COVID-19 Vaccine (1) 03/18/2021 (Originally 12/11/1950)   INFLUENZA VACCINE  12/16/2020   HEMOGLOBIN A1C  12/22/2020   COLONOSCOPY (Pts 45-67yr Insurance coverage will need to be confirmed)  06/07/2023   TETANUS/TDAP  06/26/2030   Hepatitis C Screening  Completed   PNA vac Low Risk Adult  Completed   HPV VACCINES  Aged Out   FOOT EXAM  Discontinued    Lung Cancer Screening: (Low Dose CT Chest recommended if Age 73-80years, 30 pack-year currently smoking OR have quit w/in 15years.) does not qualify.   Eye exam- plans to schedule. Deferred.  Dental Screening: Recommended annual dental exams for proper oral hygiene  Community Resource Referral / Chronic Care Management: CRR required this visit?  No   CCM required this visit?  No      Plan:   Keep all routine maintenance appointments.   I have personally reviewed and noted the following in the patient's chart:   Medical and social history Use of alcohol, tobacco or illicit drugs  Current medications and supplements including opioid prescriptions. Patient is not currently taking opioid prescriptions. Functional ability and status Nutritional status Physical activity Advanced directives List of other physicians Hospitalizations, surgeries, and ER visits in previous 12 months Vitals Screenings to include cognitive, depression, and falls Referrals and appointments  In addition, I have reviewed and discussed with patient certain preventive protocols, quality metrics, and best practice recommendations. A written personalized care plan for preventive services as well as general preventive health recommendations were provided to patient via mychart.      OVarney Biles LPN   [id number]

## 2020-11-27 NOTE — Patient Instructions (Addendum)
Kevin Webb , Thank you for taking time to come for your Medicare Wellness Visit. I appreciate your ongoing commitment to your health goals. Please review the following plan we discussed and let me know if I can assist you in the future.   These are the goals we discussed:  Goals       Patient Stated     Follow up with Primary Care Provider as needed (pt-stated)      As needed         This is a list of the screening recommended for you and due dates:  Health Maintenance  Topic Date Due   Zoster (Shingles) Vaccine (1 of 2) Never done   Eye exam for diabetics  11/27/2020*   COVID-19 Vaccine (1) 03/18/2021*   Flu Shot  12/16/2020   Hemoglobin A1C  12/22/2020   Colon Cancer Screening  06/07/2023   Tetanus Vaccine  06/26/2030   Hepatitis C Screening: USPSTF Recommendation to screen - Ages 18-79 yo.  Completed   Pneumonia vaccines  Completed   HPV Vaccine  Aged Out   Complete foot exam   Discontinued  *Topic was postponed. The date shown is not the original due date.    Advanced directives: on file  Conditions/risks identified: none new  Follow up in one year for your annual wellness visit    Preventive Care 65 Years and Older, Male Preventive care refers to lifestyle choices and visits with your health care provider that can promote health and wellness. What does preventive care include? A yearly physical exam. This is also called an annual well check. Dental exams once or twice a year. Routine eye exams. Ask your health care provider how often you should have your eyes checked. Personal lifestyle choices, including: Daily care of your teeth and gums. Regular physical activity. Eating a healthy diet. Avoiding tobacco and drug use. Limiting alcohol use. Practicing safe sex. Taking low-dose aspirin every day. Taking vitamin and mineral supplements as recommended by your health care provider. What happens during an annual well check? The services and screenings done  by your health care provider during your annual well check will depend on your age, overall health, lifestyle risk factors, and family history of disease. Counseling  Your health care provider may ask you questions about your: Alcohol use. Tobacco use. Drug use. Emotional well-being. Home and relationship well-being. Sexual activity. Eating habits. History of falls. Memory and ability to understand (cognition). Work and work Statistician. Reproductive health. Screening  You may have the following tests or measurements: Height, weight, and BMI. Blood pressure. Lipid and cholesterol levels. These may be checked every 5 years, or more frequently if you are over 34 years old. Skin check. Lung cancer screening. You may have this screening every year starting at age 67 if you have a 30-pack-year history of smoking and currently smoke or have quit within the past 15 years. Fecal occult blood test (FOBT) of the stool. You may have this test every year starting at age 29. Flexible sigmoidoscopy or colonoscopy. You may have a sigmoidoscopy every 5 years or a colonoscopy every 10 years starting at age 41. Hepatitis C blood test. Hepatitis B blood test. Sexually transmitted disease (STD) testing. Diabetes screening. This is done by checking your blood sugar (glucose) after you have not eaten for a while (fasting). You may have this done every 1-3 years. Bone density scan. This is done to screen for osteoporosis. You may have this done starting at age 46. Mammogram.  This may be done every 1-2 years. Talk to your health care provider about how often you should have regular mammograms. Talk with your health care provider about your test results, treatment options, and if necessary, the need for more tests. Vaccines  Your health care provider may recommend certain vaccines, such as: Influenza vaccine. This is recommended every year. Tetanus, diphtheria, and acellular pertussis (Tdap, Td) vaccine. You  may need a Td booster every 10 years. Zoster vaccine. You may need this after age 52. Pneumococcal 13-valent conjugate (PCV13) vaccine. One dose is recommended after age 9. Pneumococcal polysaccharide (PPSV23) vaccine. One dose is recommended after age 25. Talk to your health care provider about which screenings and vaccines you need and how often you need them. This information is not intended to replace advice given to you by your health care provider. Make sure you discuss any questions you have with your health care provider. Document Released: 05/31/2015 Document Revised: 01/22/2016 Document Reviewed: 03/05/2015 Elsevier Interactive Patient Education  2017 [place] Prevention in the Home Falls can cause injuries. They can happen to people of all ages. There are many things you can do to make your home safe and to help prevent falls. What can I do on the outside of my home? Regularly fix the edges of walkways and driveways and fix any cracks. Remove anything that might make you trip as you walk through a door, such as a raised step or threshold. Trim any bushes or trees on the path to your home. Use bright outdoor lighting. Clear any walking paths of anything that might make someone trip, such as rocks or tools. Regularly check to see if handrails are loose or broken. Make sure that both sides of any steps have handrails. Any raised decks and porches should have guardrails on the edges. Have any leaves, snow, or ice cleared regularly. Use sand or salt on walking paths during winter. Clean up any spills in your garage right away. This includes oil or grease spills. What can I do in the bathroom? Use night lights. Install grab bars by the toilet and in the tub and shower. Do not use towel bars as grab bars. Use non-skid mats or decals in the tub or shower. If you need to sit down in the shower, use a plastic, non-slip stool. Keep the floor dry. Clean up any water that spills  on the floor as soon as it happens. Remove soap buildup in the tub or shower regularly. Attach bath mats securely with double-sided non-slip rug tape. Do not have throw rugs and other things on the floor that can make you trip. What can I do in the bedroom? Use night lights. Make sure that you have a light by your bed that is easy to reach. Do not use any sheets or blankets that are too big for your bed. They should not hang down onto the floor. Have a firm chair that has side arms. You can use this for support while you get dressed. Do not have throw rugs and other things on the floor that can make you trip. What can I do in the kitchen? Clean up any spills right away. Avoid walking on wet floors. Keep items that you use a lot in easy-to-reach places. If you need to reach something above you, use a strong step stool that has a grab bar. Keep electrical cords out of the way. Do not use floor polish or wax that makes floors slippery. If you  must use wax, use non-skid floor wax. Do not have throw rugs and other things on the floor that can make you trip. What can I do with my stairs? Do not leave any items on the stairs. Make sure that there are handrails on both sides of the stairs and use them. Fix handrails that are broken or loose. Make sure that handrails are as long as the stairways. Check any carpeting to make sure that it is firmly attached to the stairs. Fix any carpet that is loose or worn. Avoid having throw rugs at the top or bottom of the stairs. If you do have throw rugs, attach them to the floor with carpet tape. Make sure that you have a light switch at the top of the stairs and the bottom of the stairs. If you do not have them, ask someone to add them for you. What else can I do to help prevent falls? Wear shoes that: Do not have high heels. Have rubber bottoms. Are comfortable and fit you well. Are closed at the toe. Do not wear sandals. If you use a stepladder: Make  sure that it is fully opened. Do not climb a closed stepladder. Make sure that both sides of the stepladder are locked into place. Ask someone to hold it for you, if possible. Clearly mark and make sure that you can see: Any grab bars or handrails. First and last steps. Where the edge of each step is. Use tools that help you move around (mobility aids) if they are needed. These include: Canes. Walkers. Scooters. Crutches. Turn on the lights when you go into a dark area. Replace any light bulbs as soon as they burn out. Set up your furniture so you have a clear path. Avoid moving your furniture around. If any of your floors are uneven, fix them. If there are any pets around you, be aware of where they are. Review your medicines with your doctor. Some medicines can make you feel dizzy. This can increase your chance of falling. Ask your doctor what other things that you can do to help prevent falls. This information is not intended to replace advice given to you by your health care provider. Make sure you discuss any questions you have with your health care provider. Document Released: 02/28/2009 Document Revised: 10/10/2015 Document Reviewed: 06/08/2014 Elsevier Interactive Patient Education  2017 [employer].

## 2020-12-25 ENCOUNTER — Encounter: Payer: Self-pay | Admitting: Internal Medicine

## 2020-12-25 ENCOUNTER — Other Ambulatory Visit: Payer: Self-pay

## 2020-12-25 ENCOUNTER — Ambulatory Visit (INDEPENDENT_AMBULATORY_CARE_PROVIDER_SITE_OTHER): Payer: Medicare Other | Admitting: Internal Medicine

## 2020-12-25 VITALS — BP 118/68 | HR 69 | Temp 97.8°F | Ht 69.0 in | Wt 207.6 lb

## 2020-12-25 DIAGNOSIS — I959 Hypotension, unspecified: Secondary | ICD-10-CM | POA: Diagnosis not present

## 2020-12-25 DIAGNOSIS — E559 Vitamin D deficiency, unspecified: Secondary | ICD-10-CM

## 2020-12-25 DIAGNOSIS — E785 Hyperlipidemia, unspecified: Secondary | ICD-10-CM | POA: Diagnosis not present

## 2020-12-25 DIAGNOSIS — Z951 Presence of aortocoronary bypass graft: Secondary | ICD-10-CM

## 2020-12-25 DIAGNOSIS — R001 Bradycardia, unspecified: Secondary | ICD-10-CM | POA: Diagnosis not present

## 2020-12-25 DIAGNOSIS — I25708 Atherosclerosis of coronary artery bypass graft(s), unspecified, with other forms of angina pectoris: Secondary | ICD-10-CM | POA: Diagnosis not present

## 2020-12-25 DIAGNOSIS — R7303 Prediabetes: Secondary | ICD-10-CM

## 2020-12-25 DIAGNOSIS — D696 Thrombocytopenia, unspecified: Secondary | ICD-10-CM | POA: Diagnosis not present

## 2020-12-25 DIAGNOSIS — K219 Gastro-esophageal reflux disease without esophagitis: Secondary | ICD-10-CM | POA: Diagnosis not present

## 2020-12-25 HISTORY — DX: Presence of aortocoronary bypass graft: Z95.1

## 2020-12-25 MED ORDER — PANTOPRAZOLE SODIUM 40 MG PO TBEC: 40.0000 mg | DELAYED_RELEASE_TABLET | Freq: Every day | ORAL | 3 refills | Status: DC

## 2020-12-25 NOTE — Patient Instructions (Addendum)
[phone] Not available [address]   [address]  Dr. Felecia Jan [place] [address]      Call Dr. Gloriann Loan to schedule eye exam    Seborrheic Ida Rogue your back A seborrheic keratosis is a common, noncancerous (benign) skin growth. These growths are velvety, waxy, rough, tan, brown, or black spots that appear on the skin. These skin growths can be flat or raised, andscaly. What are the causes? The cause of this condition is not known. What increases the risk? You are more likely to develop this condition if you: Have a family history of seborrheic keratosis. Are 50 or older. Are pregnant. Have had estrogen replacement therapy. What are the signs or symptoms? Symptoms of this condition include growths on the face, chest, shoulders, back, or other areas. These growths: Are usually painless, but may become irritated and itchy. Can be yellow, brown, black, or other colors. Are slightly raised or have a flat surface. Are sometimes rough or wart-like in texture. Are often velvety or waxy on the surface. Are round or oval-shaped. Often occur in groups, but may occur as a single growth. How is this diagnosed? This condition is diagnosed with a medical history and physical exam. A sample of the growth may be tested (skin biopsy). You may need to see a skin specialist (dermatologist). How is this treated? Treatment is not usually needed for this condition, unless the growths are irritated or bleed often. You may also choose to have the growths removed if you do not like their appearance. Most commonly, these growths are treated with a procedure in which liquid nitrogen is applied to "freeze" off the growth (cryosurgery). They may also be burned off with electricity (electrocautery) or removed by scraping (curettage). Follow these instructions at home: Watch your growth for any changes. Keep all follow-up visits as told by your health care provider. This is  important. Do not scratch or pick at the growth or growths. This can cause them to become irritated or infected. Contact a health care provider if: You suddenly have many new growths. Your growth bleeds, itches, or hurts. Your growth suddenly becomes larger or changes color. Summary A seborrheic keratosis is a common, noncancerous (benign) skin growth. Treatment is not usually needed for this condition, unless the growths are irritated or bleed often. Watch your growth for any changes. Contact a health care provider if you suddenly have many new growths or your growth suddenly becomes larger or changes color. Keep all follow-up visits as told by your health care provider. This is important. This information is not intended to replace advice given to you by your health care provider. Make sure you discuss any questions you have with your healthcare provider. Document Revised: 09/16/2017 Document Reviewed: 09/16/2017 Elsevier Patient Education  2022 [place].  Thrombocytopenia Thrombocytopenia is a condition in which you have a low number of platelets in your blood. Platelets are also called thrombocytes. Platelets are tiny cells in the blood. When you bleed, they clump together at the cut or injury to stop the bleeding. This is called blood clotting. Not having enough platelets can cause bleeding problems. Some cases of thrombocytopenia are mild while others aremore severe. What are the causes? This condition may be caused by: Decreased production of platelets. This may be caused by: Aplastic anemia. This is when your bone marrow stops making blood cells. Cancer in the bone marrow. Certain medicines, including chemotherapy. Infection in the bone marrow. Drinking a lot of alcohol. Increased destruction of platelets.  This may be caused by: Certain immune diseases. Certain medicines. Certain blood clotting disorders. Certain inherited disorders. Certain bleeding  disorders. Pregnancy. Having an enlarged spleen (hypersplenism). In hypersplenism, the spleen gathers up platelets from circulation. This means that the platelets are not available to help with blood clotting. The spleen can be enlarged because of cirrhosis or other conditions. What are the signs or symptoms? Symptoms of this condition are the result of poor blood clotting. They will vary depending on how low the platelet counts are. Symptoms may include: Abnormal bleeding. Nosebleeds. Heavy menstrual periods. Blood in the urine or stool (feces). A purplish discoloration in the skin (purpura). Bruising. A rash that looks like pinpoint, purplish-red spots (petechiae) on the skin and mucous membranes. How is this diagnosed?  This condition may be diagnosed with blood tests and a physical exam. Sometimes, a sample of bone marrow may be removed to look for the original cells (megakaryocytes) that make platelets. Other tests may be needed depending on the cause. How is this treated? Treatment for this condition depends on the cause. Treatment options may include: Treatment of another condition that is causing the low platelet count. Medicines to help protect your platelets from being destroyed. A replacement (transfusion) of platelets to stop or prevent bleeding. Surgery to remove the spleen. Follow these instructions at home: Activity Avoid activities that could cause injury or bruising, and follow instructions about how to prevent falls. Take extra care not to cut yourself when you shave or when you use scissors, needles, knives, and other tools. Take extra care to protect yourself from burns when ironing or cooking. General instructions  Check your skin and the inside of your mouth for bruising or bleeding as told by your health care provider. Check your spit (sputum), urine, and stool for blood as told by your health care provider. Do not drink alcohol. Take over-the-counter and  prescription medicines only as told by your health care provider. Do not take any medicines that have aspirin or NSAIDs in them. These medicines can thin your blood and cause you to bleed more easily. Tell all your health care providers, including dentists and eye doctors, about your condition.  Contact a health care provider if you have: Unexplained bruising. Get help right away if you have: Active bleeding from anywhere on your body. Blood in your sputum, urine, or stool. Summary Thrombocytopenia is a condition in which you have a low number of platelets in your blood. Platelets are needed for blood clotting. Symptoms of this condition are the result of poor blood clotting and may include abnormal bleeding, nosebleeds, and bruising. This condition may be diagnosed with blood tests and a physical exam. Treatment for this condition depends on the cause. This information is not intended to replace advice given to you by your health care provider. Make sure you discuss any questions you have with your healthcare provider. Document Revised: 02/03/2018 Document Reviewed: 02/03/2018 Elsevier Patient Education  [place].    Hypotension As your heart beats, it forces blood through your body. Hypotension, commonly called low blood pressure, is when the force of blood pumping through your arteries is too weak. Arteries are blood vessels that carry blood from the heart throughout the body. Depending on the cause and severity, hypotension may be harmless (benign) or may cause serious problems (be critical). When blood pressure is too low, you may not get enough blood to your brain or to the rest of your organs. This can cause weakness, light-headedness, rapidheartbeat,  and fainting. What are the causes? This condition may be caused by: Blood loss. Loss of body fluids (dehydration). Heart problems. Hormone (endocrine) problems. Pregnancy. Severe infection. Lack of certain  nutrients. Severe allergic reactions (anaphylaxis). Certain medicines, such as blood pressure medicine or medicines that make the body lose excess fluids (diuretics). Sometimes, hypotension may be caused by not taking medicine as directed, such as taking too much of a certain medicine. What increases the risk? The following factors may make you more likely to develop this condition: Age. Risk increases as you get older. Conditions that affect the heart or the central nervous system. Taking certain medicines, such as blood pressure medicine or diuretics. Being pregnant. What are the signs or symptoms? Common symptoms of this condition include: Weakness. Light-headedness. Dizziness. Blurred vision. Fatigue. Rapid heartbeat. Fainting, in severe cases. How is this diagnosed? This condition is diagnosed based on: Your medical history. Your symptoms. Your blood pressure measurement. Your health care provider will check your blood pressure when you are: Lying down. Sitting. Standing. A blood pressure reading is recorded as two numbers, such as "120 over 80" (or 120/80). The first ("top") number is called the systolic pressure. It is a measure of the pressure in your arteries as your heart beats. The second ("bottom") number is called the diastolic pressure. It is a measure of the pressure in your arteries when your heart relaxes between beats. Blood pressure is measured in a unit called mm Hg. Healthy blood pressure for most adults is 120/80. If your blood pressure is below 90/60, you may be diagnosed withhypotension. Other information or tests that may be used to diagnose hypotension include: Your other vital signs, such as your heart rate and temperature. Blood tests. Tilt table test. For this test, you will be safely secured to a table that moves you from a lying position to an upright position. Your heart rhythm and blood pressure will be monitored during the test. How is this  treated? Treatment for this condition may include: Changing your diet. This may involve eating more salt (sodium) or drinking more water. Taking medicines to raise your blood pressure. Changing the dosage of certain medicines you are taking that might be lowering your blood pressure. Wearing compression stockings. These stockings help to prevent blood clots and reduce swelling in your legs. In some cases, you may need to go to the hospital for: Fluid replacement. This means you will receive fluids through an IV. Blood replacement. This means you will receive donated blood through an IV (transfusion). Treating an infection or heart problems, if this applies. Monitoring. You may need to be monitored while medicines that you are taking wear off. Follow these instructions at home: Eating and drinking  Drink enough fluid to keep your urine pale yellow. Eat a healthy diet, and follow instructions from your health care provider about eating or drinking restrictions. A healthy diet includes: Fresh fruits and vegetables. Whole grains. Lean meats. Low-fat dairy products. Eat extra salt only as directed. Do not add extra salt to your diet unless your health care provider told you to do that. Eat frequent, small meals. Avoid standing up suddenly after eating.  Medicines Take over-the-counter and prescription medicines only as told by your health care provider. Follow instructions from your health care provider about changing the dosage of your current medicines, if this applies. Do not stop or adjust any of your medicines on your own. General instructions  Wear compression stockings as told by your health care provider. Get  up slowly from lying down or sitting positions. This gives your blood pressure a chance to adjust. Avoid hot showers and excessive heat as directed by your health care provider. Return to your normal activities as told by your health care provider. Ask your health care  provider what activities are safe for you. Do not use any products that contain nicotine or tobacco, such as cigarettes, e-cigarettes, and chewing tobacco. If you need help quitting, ask your health care provider. Keep all follow-up visits as told by your health care provider. This is important.  Contact a health care provider if you: Vomit. Have diarrhea. Have a fever for more than 2-3 days. Feel more thirsty than usual. Feel weak and tired. Get help right away if you: Have chest pain. Have a fast or irregular heartbeat. Develop numbness in any part of your body. Cannot move your arms or your legs. Have trouble speaking. Become sweaty or feel light-headed. Faint. Feel short of breath. Have trouble staying awake. Feel confused. Summary Hypotension is when the force of blood pumping through your arteries is too weak. Hypotension may be harmless (benign) or may cause serious problems (be critical). Treatment for this condition may include changing your diet, changing your medicines, and wearing compression stockings. In some cases, you may need to go to the hospital for fluid or blood replacement. This information is not intended to replace advice given to you by your health care provider. Make sure you discuss any questions you have with your healthcare provider. Document Revised: 10/28/2017 Document Reviewed: 10/28/2017 Elsevier Patient Education  [place].

## 2020-12-25 NOTE — Progress Notes (Signed)
Chief Complaint  Patient presents with   Follow-up   F/u  1. Hypotension with drinking whiskey and taking medications norvasc 5 mg qd, imdur 60 mg qd, toprol 25 mg xl bp was 87/38 and felt dizzy h/o HR in 43 at night and s/p CABG with CAD severe not currently surgical/PCI candidate per Dr. [name]  He stopped taking imdur 60 mg 3 weeks ago  Ed visit 10/12/20 for chest pain currently no chest pain   Review of Systems  Constitutional:  Negative for weight loss.  HENT:  Negative for hearing loss.   Eyes:  Negative for blurred vision.  Respiratory:  Negative for shortness of breath.   Cardiovascular:  Negative for chest pain.  Gastrointestinal:  Negative for abdominal pain.  Musculoskeletal:  Negative for falls and joint pain.  Skin:  Negative for rash.  Neurological:  Negative for headaches.  Psychiatric/Behavioral:  Negative for depression.   Past Medical History:  Diagnosis Date   Acute cholecystitis 04/18/2013   Adenomatous colon polyp 12/2004   Anemia    Blood transfusion without reported diagnosis    BPH associated with nocturia    CAD (coronary artery disease)    a. s/p CABG x4 in 2000 with LIMA-LAD, reverse SVG-IM, reverse SVG-acute margin and reverse SVG-RCA b. s/p BMS to SVG-RI in 2012 c. 12/2016: PCI/DES to SVG-RCA and PCI/DES to SVG-RI (Dr. [name])    Carotid artery stenosis    mild    Cataract    Clotting disorder ([place])    Diabetes mellitus without complication ([place]) 36/10/7701   recent A1C 6.1 12/25/16 controlled with diet and exercise    Diverticulitis 2008/2009   GERD (gastroesophageal reflux disease)    controlled as of 04/01/17    HSV infection    HTN (hypertension)    Hx of dysplastic nevus 05/04/2018   upper back spinal    Hyperglycemia    Hyperlipidemia    Impingement syndrome of left shoulder 2016   Low testosterone    Dr [name], [place] ,[place]   Myocardial infarction [facility])    Osteoarthritis of right knee    With trace effusion   Peyronie disease     RLS (restless legs syndrome) 08/15/2013   Rotator cuff injury    right; at 91-96 years old.    Sinus bradycardia    Thrombocytopenia (HCC)    Vitamin D deficiency    Past Surgical History:  Procedure Laterality Date   CHOLECYSTECTOMY N/A 04/21/2013   Procedure: LAPAROSCOPIC CHOLECYSTECTOMY WITH INTRAOPERATIVE CHOLANGIOGRAM;  Surgeon: Haywood Lasso, MD;  Location: [facility];  Service: General;  Laterality: N/A;   CHOLECYSTECTOMY     COLONOSCOPY W/ POLYPECTOMY  2004 & 2009    X 2; Dr Fuller Plan; due 2014   CORONARY ANGIOPLASTY     CORONARY ARTERY BYPASS GRAFT  04/1999   4 vessels   CORONARY STENT INTERVENTION N/A 12/28/2016   Procedure: CORONARY STENT INTERVENTION;  Surgeon: Lorretta Harp, MD;  Location: [facility];  Service: Cardiovascular;  Laterality: N/A;   CORONARY STENT PLACEMENT  2012   Plavix   LAPAROSCOPIC CHOLECYSTECTOMY  04/21/2013   Dr Margot Chimes; acutecholecystitis with necrosis   LEFT HEART CATH AND CORS/GRAFTS ANGIOGRAPHY N/A 12/28/2016   Procedure: LEFT HEART CATH AND CORS/GRAFTS ANGIOGRAPHY;  Surgeon: Lorretta Harp, MD;  Location: [facility];  Service: Cardiovascular;  Laterality: N/A;   LEFT HEART CATH AND CORS/GRAFTS ANGIOGRAPHY N/A 12/01/2019   Procedure: LEFT HEART CATH AND CORS/GRAFTS ANGIOGRAPHY;  Surgeon: Martinique, Peter  M, MD;  Location: [facility];  Service: Cardiovascular;  Laterality: N/A;   VASECTOMY     Family History  Problem Relation Age of Onset   Aortic aneurysm Father 2       ? abdominal   Heart attack Father 20   Diabetes Other        PGaunt   Coronary artery disease Mother        CABG in 85s   CVA Mother        cns bleed from warfarin   Aneurysm Mother    Diabetes Brother    Cholecystitis Sister    Cancer Neg Hx    Colon cancer Neg Hx    Prostate cancer Neg Hx    Esophageal cancer Neg Hx    Stomach cancer Neg Hx    Rectal cancer Neg Hx    Social History   Socioeconomic History   Marital status: Married    Spouse  name: Not on file   Number of children: 1   Years of education: Not on file   Highest education level: Not on file  Occupational History   Occupation:      Comment: plumber  Tobacco Use   Smoking status: Never   Smokeless tobacco: Never  Vaping Use   Vaping Use: Never used  Substance and Sexual Activity   Alcohol use: Yes    Alcohol/week: 10.0 - 12.0 standard drinks    Types: 10 - 12 Cans of beer per week    Comment: drinks beer daily; whiskey rarely, used to be daily 1-2 shots   Drug use: No   Sexual activity: Yes    Birth control/protection: None  Other Topics Concern   Not on file  Social History Narrative   Remarried 2014   1 son age 40 as of 03/2017       Lives at home with spouse   Right handed   Caffeine: 1-2 cups/day (coffee, green tea)   Former Engineer, 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[place]    Social Determinants of Radio broadcast assistant Strain: Low Risk    Difficulty of Paying Living Expenses: Not hard at all  Food Insecurity: No Food Insecurity   Worried About Charity fundraiser in the Last Year: Never true   Arboriculturist in the Last Year: Never true  Transportation Needs: No Transportation Needs   Lack of Transportation (Medical): No   Lack of Transportation (Non-Medical): No  Physical Activity: Sufficiently Active   Days of Exercise per Week: 7 days   Minutes of Exercise per Session: 60 min  Stress: No Stress Concern Present   Feeling of Stress : Not at all  Social Connections: Unknown   Frequency of Communication with Friends and Family: Not on file   Frequency of Social Gatherings with Friends and Family: Not on file   Attends Religious Services: Not on file   Active Member of Clubs or Organizations: Not on file   Attends Archivist Meetings: Not on file   Marital Status: Married  Human resources officer Violence: Not At Risk   Fear of Current or Ex-Partner: No   Emotionally Abused: No   Physically Abused: No   Sexually Abused: No   Current Meds   Medication Sig   amLODipine (NORVASC) 5 MG tablet TAKE 1 TABLET BY MOUTH DAILY   Ascorbic Acid (VITAMIN C) 1000 MG tablet Take 1,000 mg by mouth daily at 2 am. Reported on 09/26/2015   aspirin EC 81  MG tablet Take 81 mg by mouth daily at 2 am. Swallow whole.   cholecalciferol (VITAMIN D) 25 MCG (1000 UNIT) tablet Take 1,000 Units by mouth daily at 2 am.   clopidogrel (PLAVIX) 75 MG tablet TAKE 1 TABLET BY MOUTH DAILY   Coenzyme Q10 (CO Q-10 PO) Take 1 capsule by mouth daily at 2 am.   ezetimibe (ZETIA) 10 MG tablet TAKE 1 TABLET BY MOUTH DAILY   GINKGO BILOBA PO Take 1 capsule by mouth daily at 2 am.    MAGNESIUM PO Take 1 tablet by mouth daily at 2 am.   metoprolol succinate (TOPROL-XL) 25 MG 24 hr tablet TAKE 1 TABLET BY MOUTH DAILY   Multiple Vitamin (MULTIVITAMIN WITH MINERALS) TABS tablet Take 1 tablet by mouth daily at 2 am.   niacin 250 MG tablet Take 250 mg by mouth daily.   Omega-3 Fatty Acids (FISH OIL) 1000 MG CAPS Take 2,000 mg by mouth daily at 2 am.    ramipril (ALTACE) 10 MG capsule TAKE 1 CAPSULE BY MOUTH ONCE DAILY   rosuvastatin (CRESTOR) 40 MG tablet TAKE 1 TABLET AT BEDTIME   tamsulosin (FLOMAX) 0.4 MG CAPS capsule Take 0.4 mg by mouth daily at 2 am.    vitamin E 100 UNIT capsule Take 100 Units by mouth daily at 2 am.    [DISCONTINUED] pantoprazole (PROTONIX) 40 MG tablet Take 1 tablet (40 mg total) by mouth 2 (two) times daily before a meal. 30 minutes. Try to reduce to 1x per day now or in 3 months (Patient taking differently: Take 40 mg by mouth daily at 2 am. 30 minutes. Try to reduce to 1x per day now or in 3 months)   Current Facility-Administered Medications for the 12/25/20 encounter (Office Visit) with McLean-Scocuzza, Nino Glow, MD  Medication   sodium chloride flush (NS) 0.9 % injection 3 mL   Allergies  Allergen Reactions   Darvocet [Propoxyphene N-Acetaminophen] Hives   Propoxyphene Hives   Recent Results (from the past 2160 hour(s))  Basic metabolic panel      Status: Abnormal   Collection Time: 10/12/20  7:49 PM  Result Value Ref Range   Sodium 137 135 - 145 mmol/L   Potassium 3.8 3.5 - 5.1 mmol/L   Chloride 105 98 - 111 mmol/L   CO2 22 22 - 32 mmol/L   Glucose, Bld 143 (H) 70 - 99 mg/dL    Comment: Glucose reference range applies only to samples taken after fasting for at least 8 hours.   BUN 7 (L) 8 - 23 mg/dL   Creatinine, Ser 0.81 0.61 - 1.24 mg/dL   Calcium 8.7 (L) 8.9 - 10.3 mg/dL   GFR, Estimated >60 >60 mL/min    Comment: (NOTE) Calculated using the CKD-EPI Creatinine Equation (2021)    Anion gap 10 5 - 15    Comment: Performed at [facility] [address] [place], [place] [address]  CBC     Status: Abnormal   Collection Time: 10/12/20  7:49 PM  Result Value Ref Range   WBC 4.7 4.0 - 10.5 K/uL   RBC 4.57 4.22 - 5.81 MIL/uL   Hemoglobin 14.3 13.0 - 17.0 g/dL   HCT 41.7 39.0 - 52.0 %   MCV 91.2 80.0 - 100.0 fL   MCH 31.3 26.0 - 34.0 pg   MCHC 34.3 30.0 - 36.0 g/dL   RDW 13.5 11.5 - 15.5 %   Platelets 141 (L) 150 - 400 K/uL   nRBC  0.0 0.0 - 0.2 %    Comment: Performed at [facility], [place] [address], [place], [place] [address]  Troponin I (High Sensitivity)     Status: None   Collection Time: 10/12/20  7:49 PM  Result Value Ref Range   Troponin I (High Sensitivity) 5 <18 ng/L    Comment: (NOTE) Elevated high sensitivity troponin I (hsTnI) values and significant  changes across serial measurements may suggest ACS but many other  chronic and acute conditions are known to elevate hsTnI results.  Refer to the "Links" section for chest pain algorithms and additional  guidance. Performed at [facility], [place] [address], [place], [place] [address]   Troponin I (High Sensitivity)     Status: None   Collection Time: 10/12/20  9:49 PM  Result Value Ref Range   Troponin I (High Sensitivity) 6 <18 ng/L    Comment: (NOTE) Elevated high sensitivity troponin I (hsTnI) values and significant  changes  across serial measurements may suggest ACS but many other  chronic and acute conditions are known to elevate hsTnI results.  Refer to the "Links" section for chest pain algorithms and additional  guidance. Performed at [facility], [place] [address], [place],  [address]    Objective  Body mass index is 30.66 kg/m. Wt Readings from Last 3 Encounters:  12/25/20 207 lb 9.6 oz (94.2 kg)  11/27/20 211 lb (95.7 kg)  10/12/20 211 lb 13.8 oz (96.1 kg)   Temp Readings from Last 3 Encounters:  12/25/20 97.8 F (36.6 C) (Temporal)  10/13/20 98.4 F (36.9 C) (Oral)  06/26/20 98.1 F (36.7 C) (Oral)   BP Readings from Last 3 Encounters:  12/25/20 118/68  10/13/20 133/72  06/26/20 (!) 108/58   Pulse Readings from Last 3 Encounters:  12/25/20 69  10/13/20 (!) 59  06/26/20 68    Physical Exam Vitals and nursing note reviewed.  Constitutional:      Appearance: Normal appearance. He is well-developed and well-groomed.  HENT:     Head: Normocephalic and atraumatic.  Eyes:     Conjunctiva/sclera: Conjunctivae normal.     Pupils: Pupils are equal, round, and reactive to light.  Cardiovascular:     Rate and Rhythm: Normal rate and regular rhythm.     Heart sounds: Normal heart sounds. No murmur heard. Pulmonary:     Effort: Pulmonary effort is normal.     Breath sounds: Normal breath sounds.  Abdominal:     Tenderness: There is no abdominal tenderness.  Skin:    General: Skin is warm and dry.  Neurological:     General: No focal deficit present.     Mental Status: He is alert and oriented to person, place, and time. Mental status is at baseline.     Gait: Gait normal.  Psychiatric:        Attention and Perception: Attention and perception normal.        Mood and Affect: Mood and affect normal.        Speech: Speech normal.        Behavior: Behavior normal. Behavior is cooperative.        Thought Content: Thought content normal.        Cognition and Memory:  Cognition and memory normal.        Judgment: Judgment normal.    Assessment  Plan  Hyperlipidemia, unspecified hyperlipidemia type - Plan: Lipid panel, Hepatic function panel Coronary artery disease of bypass graft of native  heart with stable angina pectoris ([place]) - Plan: Lipid panel, Hepatic function panel Hypotension Bradycardia w/o sx's  Hx of CABG  Pt stopped imdur 60 mg qd and on norvasc 5 mg qd toprol 25 xl qd   Vitamin D deficiency - Plan: Vitamin D (25 hydroxy)  Thrombocytopenia ([place]) - Plan: CBC with Differential/Platelet, Pathologist smear review Could be related etoh   Prediabetes - Plan: Hemoglobin A1c  Gastroesophageal reflux disease - Plan: pantoprazole (PROTONIX) 40 MG tablet   HM  flu shot utd - utd zostavax, prevnar, pna 23 Tdap utd   Rx  shingrix mailed prev Consider covid 19 vaccine declines as of 06/26/20   Consider hep B vaccine not immune MMR immune -Dr. Rogers Blocker urology   Results for GEHRIG, PATRAS "GENE" (MRN [id number]) as of 06/26/2020 14:42   Ref. Range 06/24/2020 08:48  PSA Latest Ref Range: 0.10 - 4.00 ng/ml 1.72    Had eye exam Dr. Gloriann Loan ~11/2017 get records  Never smoker no CT chest [place] 04/2013 abdominal aorta normal   Colonoscopy had 05/02/15 Dr. Fuller Plan sessile polyp repeat 04/2020 per report . Saw Dr. Silvio Pate 01/2018 and 08/23/2018 and 10/03/18 had upper endoscopy negative bx  Colonoscopy had 06/06/20 tubular adenoma f/u in 3 year   saw derm 05/03/17 noted SK, lentigos, rosacea, Aks face and arms f/u in 1 year [facility]. [place].      Cards Dr. [name] f/u 04/22/20, 04/2021  Provider: Dr. Olivia Mackie McLean-Scocuzza-Internal Medicine

## 2021-01-11 ENCOUNTER — Other Ambulatory Visit: Payer: Self-pay | Admitting: Internal Medicine

## 2021-01-14 NOTE — Telephone Encounter (Signed)
Patient requesting refill on Cipro that was filled 5/21?

## 2021-01-15 ENCOUNTER — Encounter: Payer: Self-pay | Admitting: Dermatology

## 2021-01-15 ENCOUNTER — Other Ambulatory Visit: Payer: Self-pay

## 2021-01-15 ENCOUNTER — Ambulatory Visit (INDEPENDENT_AMBULATORY_CARE_PROVIDER_SITE_OTHER): Payer: Medicare Other | Admitting: Dermatology

## 2021-01-15 DIAGNOSIS — D1800 Hemangioma unspecified site: Secondary | ICD-10-CM | POA: Diagnosis not present

## 2021-01-15 DIAGNOSIS — L821 Other seborrheic keratosis: Secondary | ICD-10-CM | POA: Diagnosis not present

## 2021-01-15 DIAGNOSIS — I25708 Atherosclerosis of coronary artery bypass graft(s), unspecified, with other forms of angina pectoris: Secondary | ICD-10-CM | POA: Diagnosis not present

## 2021-01-15 DIAGNOSIS — D229 Melanocytic nevi, unspecified: Secondary | ICD-10-CM | POA: Diagnosis not present

## 2021-01-15 DIAGNOSIS — L814 Other melanin hyperpigmentation: Secondary | ICD-10-CM | POA: Diagnosis not present

## 2021-01-15 DIAGNOSIS — L820 Inflamed seborrheic keratosis: Secondary | ICD-10-CM | POA: Diagnosis not present

## 2021-01-15 DIAGNOSIS — Z1283 Encounter for screening for malignant neoplasm of skin: Secondary | ICD-10-CM | POA: Diagnosis not present

## 2021-01-15 DIAGNOSIS — Z86018 Personal history of other benign neoplasm: Secondary | ICD-10-CM

## 2021-01-15 DIAGNOSIS — R238 Other skin changes: Secondary | ICD-10-CM | POA: Diagnosis not present

## 2021-01-15 NOTE — Progress Notes (Signed)
Follow-Up Visit   Subjective  Kevin Webb is a 75 y.o. male who presents for the following: Skin Problem (Check spot on back. Dur: years. Dr. Nehemiah Massed has checked in the past. HxDN on back 2018. Waist up exam today./Check spots on temples. ). Spot on shoulder gets irritated, and he scratches it.    The following portions of the chart were reviewed this encounter and updated as appropriate:      Review of Systems: No other skin or systemic complaints except as noted in HPI or Assessment and Plan.   Objective  Well appearing patient in no apparent distress; mood and affect are within normal limits.  All skin waist up examined.  Right Lower Back Stuck-on, waxy, tan-brown papules and plaques -- Discussed benign etiology and prognosis.   Right Shoulder - Posterior x1, right clavicle x1 (2) Erythematous keratotic or waxy stuck-on papule   Right Lower Vermilion Lip Violaceous papule  Assessment & Plan   Lentigines - Scattered tan macules - Due to sun exposure - Benign-appering, observe - Recommend daily broad spectrum sunscreen SPF 30+ to sun-exposed areas, reapply every 2 hours as needed. - Call for any changes  Seborrheic Keratoses - Stuck-on, waxy, tan-brown papules and/or plaques  - Benign-appearing - Discussed benign etiology and prognosis. - Observe - Call for any changes  Melanocytic Nevi - Tan-brown and/or pink-flesh-colored symmetric macules and papules - Benign appearing on exam today - Observation - Call clinic for new or changing moles - Recommend daily use of broad spectrum spf 30+ sunscreen to sun-exposed areas.   Hemangiomas - Red papules - Discussed benign nature - Observe - Call for any changes  Actinic Damage - Chronic condition, secondary to cumulative UV/sun exposure - diffuse scaly erythematous macules with underlying dyspigmentation - Recommend daily broad spectrum sunscreen SPF 30+ to sun-exposed areas, reapply every 2 hours as needed.   - Staying in the shade or wearing long sleeves, sun glasses (UVA+UVB protection) and wide brim hats (4-inch brim around the entire circumference of the hat) are also recommended for sun protection.  - Call for new or changing lesions.  Skin cancer screening performed today.  History of Dysplastic Nevus - No evidence of recurrence today at spinal upper back. - Recommend regular full body skin exams - Recommend daily broad spectrum sunscreen SPF 30+ to sun-exposed areas, reapply every 2 hours as needed.  - Call if any new or changing lesions are noted between office visits   Seborrheic keratosis Right Lower Back  Reassured benign age-related growth.  Recommend observation.  Discussed cryotherapy if spot(s) become irritated or inflamed.  Inflamed seborrheic keratosis Right Shoulder - Posterior x1, right clavicle x1  Destruction of lesion - Right Shoulder - Posterior x1, right clavicle x1  Destruction method: cryotherapy   Informed consent: discussed and consent obtained   Lesion destroyed using liquid nitrogen: Yes   Region frozen until ice ball extended beyond lesion: Yes   Outcome: patient tolerated procedure well with no complications   Post-procedure details: wound care instructions given    Venous lake of lip Right Lower Vermilion Lip  Benign-appearing.  Observation.  Call clinic for new or changing lesions.  Recommend daily use of broad spectrum spf 30+ sunscreen to sun-exposed areas.    Return if symptoms worsen or fail to improve.  I, Emelia Salisbury, CMA, am acting as scribe for Brendolyn Patty, MD.  Documentation: I have reviewed the above documentation for accuracy and completeness, and I agree with the above.  Baxter Flattery  Nicole Kindred MD

## 2021-01-15 NOTE — Patient Instructions (Addendum)
Seborrheic Keratosis  What causes seborrheic keratoses? Seborrheic keratoses are harmless, common skin growths that first appear during adult life.  As time goes by, more growths appear.  Some people may develop a large number of them.  Seborrheic keratoses appear on both covered and uncovered body parts.  They are not caused by sunlight.  The tendency to develop seborrheic keratoses can be inherited.  They vary in color from skin-colored to gray, brown, or even black.  They can be either smooth or have a rough, warty surface.   Seborrheic keratoses are superficial and look as if they were stuck on the skin.  Under the microscope this type of keratosis looks like layers upon layers of skin.  That is why at times the top layer may seem to fall off, but the rest of the growth remains and re-grows.    Treatment Seborrheic keratoses do not need to be treated, but can easily be removed in the office.  Seborrheic keratoses often cause symptoms when they rub on clothing or jewelry.  Lesions can be in the way of shaving.  If they become inflamed, they can cause itching, soreness, or burning.  Removal of a seborrheic keratosis can be accomplished by freezing, burning, or surgery. If any spot bleeds, scabs, or grows rapidly, please return to have it checked, as these can be an indication of a skin cancer.  Cryotherapy  Cryotherapy is the treatment of lesions with the application of a cold substance.  In most cases, liquid nitrogen is used to destroy the lesion(s).  Liquid nitrogen is so cold, -196 Celsius, it feels like it is burning when it is applied.  After treatment with liquid nitrogen, there may be some burning sensation or pain that can last up to 24 hours.  The area may also be swollen and red.  The discomfort can be relieved with ibuprofen (Advil, Motrin), acetaminophen (Extra Strength Tylenol), or similar pain relief medication.  Within 24 to 48 hours, a blister may form.  Occasionally, these  blisters will become filled with blood and become very dark.  This is no cause for concern.  The blister will gradually dry up over a period of several days, eventually separating from the healing skin below in about one to two weeks.  The surrounding skin will become red and the area may become itchy.  This is all part of the normal healing process.  Occasionally, the crusts will last as long as four weeks when certain deeper spots on the skin are treated.  Sometimes a permanent white mark or scar will be left after healing.  You may continue all of your normal activities as long as they do not cause pain in the treated areas.  It is okay to get the area wet.  After therapy or if the blister is still intact - You may treat it like normal skin.  Covering the area with a bandage may offer some comfort and protection from trauma but is not absolutely necessary.  If the blister is uncomfortable - Clean a small needle with rubbing alcohol, then gently make a small hole in the side of the blister to drain the blister fluid.  This often gives immediate relief.  Do not remove the blister roof, as the blister aids in healing.  In time it will fall off on its own.   Once the blister roof falls off or a sore forms -  Clean the blister sites with soap and water.  Rinse the area and  pat dry.  Do not force off the blister roof or crust. Apply an antibiotic ointment such as Polysporin or Bacitracin. A bandage may be applied loosely over the blister until it is healed if desired. Call our office if you are concerned it may be infected.  Some redness, itching and oozing is part of the normal healing process.  Signs of infection include increasing redness, increasing pain, swelling, heat, or yellow discharge.   If you have any questions or concerns for your doctor, please call our main line at [phone] and press option 4 to reach your doctor's medical assistant. If no one answers, please leave a voicemail as  directed and we will return your call as soon as possible. Messages left after 4 pm will be answered the following business day.   You may also send [place] a message via [place]. We typically respond to MyChart messages within 1-2 business days.  For prescription refills, please ask your pharmacy to contact our office. Our fax number is [phone].  If you have an urgent issue when the clinic is closed that cannot wait until the next business day, you can page your doctor at the number below.    Please note that while we do our best to be available for urgent issues outside of office hours, we are not available 24/7.   If you have an urgent issue and are unable to reach [place], you may choose to seek medical care at your doctor's office, retail clinic, urgent care center, or emergency room.  If you have a medical emergency, please immediately call 911 or go to the emergency department.  Pager Numbers  - Dr. Nehemiah Massed: [phone]  - Dr. Laurence Ferrari: [phone]  - Dr. Nicole Kindred: [phone]  In the event of inclement weather, please call our main line at [phone] for an update on the status of any delays or closures.  Dermatology Medication Tips: Please keep the boxes that topical medications come in in order to help keep track of the instructions about where and how to use these. Pharmacies typically print the medication instructions only on the boxes and not directly on the medication tubes.   If your medication is too expensive, please contact our office at [phone] option 4 or send [place] a message through [place].   We are unable to tell what your co-pay for medications will be in advance as this is different depending on your insurance coverage. However, we may be able to find a substitute medication at lower cost or fill out paperwork to get insurance to cover a needed medication.   If a prior authorization is required to get your medication covered by your insurance company, please  allow [place] 1-2 business days to complete this process.  Drug prices often vary depending on where the prescription is filled and some pharmacies may offer cheaper prices.  The website www.goodrx.com contains coupons for medications through different pharmacies. The prices here do not account for what the cost may be with help from insurance (it may be cheaper with your insurance), but the website can give you the price if you did not use any insurance.  - You can print the associated coupon and take it with your prescription to the pharmacy.  - You may also stop by our office during regular business hours and pick up a GoodRx coupon card.  - If you need your prescription sent electronically to a different pharmacy, notify our office through [facility] or by phone at [phone] option  4.  

## 2021-01-21 DIAGNOSIS — H25813 Combined forms of age-related cataract, bilateral: Secondary | ICD-10-CM | POA: Diagnosis not present

## 2021-01-28 ENCOUNTER — Other Ambulatory Visit: Payer: Self-pay

## 2021-01-28 ENCOUNTER — Other Ambulatory Visit (INDEPENDENT_AMBULATORY_CARE_PROVIDER_SITE_OTHER): Payer: Medicare Other

## 2021-01-28 DIAGNOSIS — R7303 Prediabetes: Secondary | ICD-10-CM

## 2021-01-28 DIAGNOSIS — E785 Hyperlipidemia, unspecified: Secondary | ICD-10-CM | POA: Diagnosis not present

## 2021-01-28 DIAGNOSIS — D696 Thrombocytopenia, unspecified: Secondary | ICD-10-CM

## 2021-01-28 DIAGNOSIS — I25708 Atherosclerosis of coronary artery bypass graft(s), unspecified, with other forms of angina pectoris: Secondary | ICD-10-CM | POA: Diagnosis not present

## 2021-01-28 DIAGNOSIS — E559 Vitamin D deficiency, unspecified: Secondary | ICD-10-CM | POA: Diagnosis not present

## 2021-01-28 LAB — CBC WITH DIFFERENTIAL/PLATELET
Basophils Absolute: 0 10*3/uL (ref 0.0–0.1)
Basophils Relative: 0.3 % (ref 0.0–3.0)
Eosinophils Absolute: 0 10*3/uL (ref 0.0–0.7)
Eosinophils Relative: 0.8 % (ref 0.0–5.0)
HCT: 41 % (ref 39.0–52.0)
Hemoglobin: 13.9 g/dL (ref 13.0–17.0)
Lymphocytes Relative: 23.2 % (ref 12.0–46.0)
Lymphs Abs: 1.2 10*3/uL (ref 0.7–4.0)
MCHC: 33.8 g/dL (ref 30.0–36.0)
MCV: 91.4 fl (ref 78.0–100.0)
Monocytes Absolute: 0.4 10*3/uL (ref 0.1–1.0)
Monocytes Relative: 8 % (ref 3.0–12.0)
Neutro Abs: 3.4 10*3/uL (ref 1.4–7.7)
Neutrophils Relative %: 67.7 % (ref 43.0–77.0)
Platelets: 141 10*3/uL — ABNORMAL LOW (ref 150.0–400.0)
RBC: 4.49 Mil/uL (ref 4.22–5.81)
RDW: 14 % (ref 11.5–15.5)
WBC: 5 10*3/uL (ref 4.0–10.5)

## 2021-01-28 LAB — LIPID PANEL
Cholesterol: 137 mg/dL (ref 0–200)
HDL: 45.7 mg/dL (ref >39.00)
LDL Cholesterol: 58 mg/dL (ref 0–99)
NonHDL: 91.64
Total CHOL/HDL Ratio: 3
Triglycerides: 167 mg/dL — ABNORMAL HIGH (ref 0.0–149.0)
VLDL: 33.4 mg/dL (ref 0.0–40.0)

## 2021-01-28 LAB — HEPATIC FUNCTION PANEL
ALT: 18 U/L (ref 0–53)
AST: 20 U/L (ref 0–37)
Albumin: 4.1 g/dL (ref 3.5–5.2)
Alkaline Phosphatase: 47 U/L (ref 39–117)
Bilirubin, Direct: 0.2 mg/dL (ref 0.0–0.3)
Total Bilirubin: 1 mg/dL (ref 0.2–1.2)
Total Protein: 6.3 g/dL (ref 6.0–8.3)

## 2021-01-28 LAB — HEMOGLOBIN A1C: Hgb A1c MFr Bld: 6.8 % — ABNORMAL HIGH (ref 4.6–6.5)

## 2021-01-28 LAB — VITAMIN D 25 HYDROXY (VIT D DEFICIENCY, FRACTURES): VITD: 51.16 ng/mL (ref 30.00–100.00)

## 2021-01-29 LAB — PATHOLOGIST SMEAR REVIEW

## 2021-03-24 ENCOUNTER — Other Ambulatory Visit: Payer: Self-pay | Admitting: Cardiology

## 2021-04-17 NOTE — Progress Notes (Signed)
Cardiology Office Note   Date:  04/18/2021   ID:  Karas, Pickerill 06/07/1945, MRN [id number]  PCP:  McLean-Scocuzza, Nino Glow, MD  Cardiologist:   Minus Breeding, MD   Chief Complaint  Patient presents with   Chest Pain       History of Present Illness: Kevin Webb is a 75 y.o. male who presents for evaluation of CAD.   He last had a cath in July of 2021with results as below.  Since I last saw him he was in the ED in May with PVCs.  I reviewed these records for this visit.  He does not really recall the specifics of this visit.  He thinks he might of had some chest discomfort and the notes are vague.  He said that otherwise he has been doing well.  He still walks 09-7998 steps a day.  He does a lot of climbing on his job.  Still very active.  He denies any chest pressure, neck or arm discomfort.  He said no new shortness of breath, PND or orthopnea.  He has no palpitations, presyncope or syncope.  Of note he does have some mild discomfort occasionally when he moves a certain way or stretches.   Past Medical History:  Diagnosis Date   Acute cholecystitis 04/18/2013   Adenomatous colon polyp 12/2004   Anemia    Blood transfusion without reported diagnosis    BPH associated with nocturia    CAD (coronary artery disease)    a. s/p CABG x4 in 2000 with LIMA-LAD, reverse SVG-IM, reverse SVG-acute margin and reverse SVG-RCA b. s/p BMS to SVG-RI in 2012 c. 12/2016: PCI/DES to SVG-RCA and PCI/DES to SVG-RI (Dr. [name])    Carotid artery stenosis    mild    Cataract    Clotting disorder ([place])    Diabetes mellitus without complication ([place]) 11/15/1599   recent A1C 6.1 12/25/16 controlled with diet and exercise    Diverticulitis 2008/2009   GERD (gastroesophageal reflux disease)    controlled as of 04/01/17    HSV infection    HTN (hypertension)    Hx of dysplastic nevus 05/04/2018   upper back spinal    Hyperglycemia    Hyperlipidemia    Impingement syndrome of left  shoulder 2016   Low testosterone    Dr [name], [place] [facility]   Myocardial infarction [facility])    Osteoarthritis of right knee    With trace effusion   Peyronie disease    RLS (restless legs syndrome) 08/15/2013   Rotator cuff injury    right; at 72-45 years old.    Sinus bradycardia    Thrombocytopenia (HCC)    Vitamin D deficiency     Past Surgical History:  Procedure Laterality Date   CHOLECYSTECTOMY N/A 04/21/2013   Procedure: LAPAROSCOPIC CHOLECYSTECTOMY WITH INTRAOPERATIVE CHOLANGIOGRAM;  Surgeon: Haywood Lasso, MD;  Location: [facility];  Service: General;  Laterality: N/A;   CHOLECYSTECTOMY     COLONOSCOPY W/ POLYPECTOMY  2004 & 2009    X 2; Dr Fuller Plan; due 2014   CORONARY ANGIOPLASTY     CORONARY ARTERY BYPASS GRAFT  04/1999   4 vessels   CORONARY STENT INTERVENTION N/A 12/28/2016   Procedure: CORONARY STENT INTERVENTION;  Surgeon: Lorretta Harp, MD;  Location: [place] [facility];  Service: Cardiovascular;  Laterality: N/A;   CORONARY STENT PLACEMENT  2012   Plavix   LAPAROSCOPIC CHOLECYSTECTOMY  04/21/2013   Dr Margot Chimes; acutecholecystitis with necrosis  LEFT HEART CATH AND CORS/GRAFTS ANGIOGRAPHY N/A 12/28/2016   Procedure: LEFT HEART CATH AND CORS/GRAFTS ANGIOGRAPHY;  Surgeon: Lorretta Harp, MD;  Location: [place] [facility];  Service: Cardiovascular;  Laterality: N/A;   LEFT HEART CATH AND CORS/GRAFTS ANGIOGRAPHY N/A 12/01/2019   Procedure: LEFT HEART CATH AND CORS/GRAFTS ANGIOGRAPHY;  Surgeon: Martinique, Peter M, MD;  Location: [place] [facility];  Service: Cardiovascular;  Laterality: N/A;   VASECTOMY       Current Outpatient Medications  Medication Sig Dispense Refill   amLODipine (NORVASC) 5 MG tablet TAKE 1 TABLET BY MOUTH DAILY 90 tablet 3   Ascorbic Acid (VITAMIN C) 1000 MG tablet Take 1,000 mg by mouth daily at 2 am. Reported on 09/26/2015     aspirin EC 81 MG tablet Take 81 mg by mouth daily at 2 am. Swallow whole.     cholecalciferol (VITAMIN D) 25 MCG (1000  UNIT) tablet Take 1,000 Units by mouth daily at 2 am.     clopidogrel (PLAVIX) 75 MG tablet TAKE 1 TABLET BY MOUTH DAILY 90 tablet 3   Coenzyme Q10 (CO Q-10 PO) Take 1 capsule by mouth daily at 2 am.     dicyclomine (BENTYL) 10 MG capsule Take 1 capsule (10 mg total) by mouth 3 (three) times daily with meals as needed for spasms. 90 capsule 11   ezetimibe (ZETIA) 10 MG tablet TAKE 1 TABLET BY MOUTH DAILY 90 tablet 3   GINKGO BILOBA PO Take 1 capsule by mouth daily at 2 am.      isosorbide mononitrate (IMDUR) 60 MG 24 hr tablet TAKE 1 TABLET BY MOUTH DAILY 90 tablet 1   MAGNESIUM PO Take 1 tablet by mouth daily at 2 am.     metoprolol succinate (TOPROL-XL) 25 MG 24 hr tablet TAKE 1 TABLET BY MOUTH DAILY 90 tablet 3   Multiple Vitamin (MULTIVITAMIN WITH MINERALS) TABS tablet Take 1 tablet by mouth daily at 2 am.     niacin 250 MG tablet Take 250 mg by mouth daily.     nitroGLYCERIN (NITROSTAT) 0.4 MG SL tablet Place 1 tablet (0.4 mg total) under the tongue every 5 (five) minutes x 3 doses as needed. 25 tablet 4   Omega-3 Fatty Acids (FISH OIL) 1000 MG CAPS Take 2,000 mg by mouth daily at 2 am.      pantoprazole (PROTONIX) 40 MG tablet Take 1 tablet (40 mg total) by mouth daily at 2 am. 30 minutes. 90 tablet 3   ramipril (ALTACE) 10 MG capsule TAKE 1 CAPSULE BY MOUTH ONCE DAILY 90 capsule 3   rosuvastatin (CRESTOR) 40 MG tablet TAKE 1 TABLET AT BEDTIME 90 tablet 3   tamsulosin (FLOMAX) 0.4 MG CAPS capsule Take 0.4 mg by mouth daily at 2 am.      vitamin E 100 UNIT capsule Take 100 Units by mouth daily at 2 am.      Current Facility-Administered Medications  Medication Dose Route Frequency Provider Last Rate Last Admin   sodium chloride flush (NS) 0.9 % injection 3 mL  3 mL Intravenous Q12H Almyra Deforest, PA        Allergies:   Darvocet [propoxyphene n-acetaminophen] and Propoxyphene    ROS:  Please see the history of present illness.   Otherwise, review of systems are positive for none.   All  other systems are reviewed and negative.    PHYSICAL EXAM: VS:  BP 128/73   Pulse 64   Ht 5\' 9"  (1.753 m)  Wt 211 lb (95.7 kg)   SpO2 98%   BMI 31.16 kg/m  , BMI Body mass index is 31.16 kg/m. GENERAL:  Well appearing NECK:  No jugular venous distention, waveform within normal limits, carotid upstroke brisk and symmetric, no bruits, no thyromegaly LUNGS:  Clear to auscultation bilaterally CHEST:  Well healed sternotomy scar. HEART:  PMI not displaced or sustained,S1 and S2 within normal limits, no S3, no S4, no clicks, no rubs, no murmurs ABD:  Flat, positive bowel sounds normal in frequency in pitch, no bruits, no rebound, no guarding, no midline pulsatile mass, no hepatomegaly, no splenomegaly EXT:  2 plus pulses throughout, no edema, no cyanosis no clubbing   EKG:  EKG not ordered today. NA.   I did review the EKG from his emergency room visit he had chronic left bundle branch block with premature atrial contractions but no acute findings.   Recent Labs: 06/24/2020: TSH 1.83 10/12/2020: BUN 7; Creatinine, Ser 0.81; Potassium 3.8; Sodium 137 01/28/2021: ALT 18; Hemoglobin 13.9; Platelets 141.0   Lab Results  Component Value Date   HGBA1C 6.8 (H) 01/28/2021     Lipid Panel    Component Value Date/Time   CHOL 137 01/28/2021 0906   TRIG 167.0 (H) 01/28/2021 0906   HDL 45.70 01/28/2021 0906   CHOLHDL 3 01/28/2021 0906   VLDL 33.4 01/28/2021 0906   LDLCALC 58 01/28/2021 0906   LDLDIRECT 74.0 10/24/2019 0912      Wt Readings from Last 3 Encounters:  04/18/21 211 lb (95.7 kg)  12/25/20 207 lb 9.6 oz (94.2 kg)  11/27/20 211 lb (95.7 kg)     Cath   12/01/19   Ost LAD to Prox LAD lesion is 100% stenosed. 1st Diag lesion is 60% stenosed. Ramus lesion is 100% stenosed. Prox Cx lesion is 40% stenosed. Prox RCA lesion is 100% stenosed. Mid RCA lesion is 100% stenosed. Prox Graft lesion is 100% stenosed. Prox Graft lesion is 100% stenosed. Origin to Prox Graft lesion  is 100% stenosed. The left ventricular systolic function is normal. LV end diastolic pressure is normal. The left ventricular ejection fraction is 55-65% by visual estimate.   1. Severe 3 vessel occlusive CAD involving the ostial LAD, ostial ramus intermediate and proximal RCA. 2. Occluded SVG to PDA 3. Occluded SVG to ramus intermediate 4. Patent LIMA to the LAD.  5. Normal LV function 6. Normal LVEDP   Other studies Reviewed: Additional studies/ records that were reviewed today include: ED records Review of the above records demonstrates:  Please see elsewhere in the note.     ASSESSMENT AND PLAN:   CAD/CABG-  The patient has no new sypmtoms.  No further cardiovascular testing is indicated.  We will continue with aggressive risk reduction and meds as listed.he has anatomy as above.  He continues with aggressive risk reduction.   The mild chest discomfort that he is describing is nonanginal.  In the emergency room there was no objective evidence of ischemia  HYPERLIPIDEMIA -  Goal LDL is 58.  No change in therapy.   HYPERTENSION -  Blood pressures is controlled.  No change in therapy.   DM  A1c was 6.8 which is up from his 5.9 previous.  He is having this followed by McLean-Scocuzza, Nino Glow, MD. of note he says he has been drinking a little too much whiskey which might contribute.  He is also going to look at diet.    Current medicines are reviewed at length with the patient  today.  The patient does not have concerns regarding medicines.  The following changes have been made:   None  Labs/ tests ordered today include:    None  No orders of the defined types were placed in this encounter.    Disposition:   FU with me in 12 months.   Signed, Minus Breeding, MD  04/18/2021 10:08 AM    [place]

## 2021-04-18 ENCOUNTER — Ambulatory Visit (INDEPENDENT_AMBULATORY_CARE_PROVIDER_SITE_OTHER): Payer: Medicare Other | Admitting: Cardiology

## 2021-04-18 ENCOUNTER — Other Ambulatory Visit: Payer: Self-pay

## 2021-04-18 ENCOUNTER — Encounter: Payer: Self-pay | Admitting: Cardiology

## 2021-04-18 VITALS — BP 128/73 | HR 64 | Ht 69.0 in | Wt 211.0 lb

## 2021-04-18 DIAGNOSIS — E118 Type 2 diabetes mellitus with unspecified complications: Secondary | ICD-10-CM

## 2021-04-18 DIAGNOSIS — I10 Essential (primary) hypertension: Secondary | ICD-10-CM | POA: Diagnosis not present

## 2021-04-18 DIAGNOSIS — I25810 Atherosclerosis of coronary artery bypass graft(s) without angina pectoris: Secondary | ICD-10-CM

## 2021-04-18 DIAGNOSIS — R42 Dizziness and giddiness: Secondary | ICD-10-CM

## 2021-04-18 DIAGNOSIS — E785 Hyperlipidemia, unspecified: Secondary | ICD-10-CM | POA: Diagnosis not present

## 2021-04-18 NOTE — Patient Instructions (Signed)
Medication Instructions:  Your Physician recommend you continue on your current medication as directed.    *If you need a refill on your cardiac medications before your next appointment, please call your pharmacy*    Follow-Up: At [facility], you and your health needs are our priority.  As part of our continuing mission to provide you with exceptional heart care, we have created designated Provider Care Teams.  These Care Teams include your primary Cardiologist (physician) and Advanced Practice Providers (APPs -  Physician Assistants and Nurse Practitioners) who all work together to provide you with the care you need, when you need it.  We recommend signing up for the patient portal called "MyChart".  Sign up information is provided on this After Visit Summary.  MyChart is used to connect with patients for Virtual Visits (Telemedicine).  Patients are able to view lab/test results, encounter notes, upcoming appointments, etc.  Non-urgent messages can be sent to your provider as well.   To learn more about what you can do with MyChart, go to [email or web address].    Your next appointment:   1 year(s)  The format for your next appointment:   In Person  Provider:   Minus Breeding, MD

## 2021-06-19 ENCOUNTER — Other Ambulatory Visit: Payer: Self-pay | Admitting: Gastroenterology

## 2021-06-19 ENCOUNTER — Encounter: Payer: Self-pay | Admitting: Internal Medicine

## 2021-06-20 ENCOUNTER — Telehealth: Payer: Self-pay | Admitting: Gastroenterology

## 2021-06-20 ENCOUNTER — Telehealth: Payer: Self-pay | Admitting: Internal Medicine

## 2021-06-20 ENCOUNTER — Other Ambulatory Visit: Payer: Self-pay

## 2021-06-20 MED ORDER — METRONIDAZOLE 500 MG PO TABS: 500.0000 mg | ORAL_TABLET | Freq: Two times a day (BID) | ORAL | 0 refills | Status: AC

## 2021-06-20 MED ORDER — CIPROFLOXACIN HCL 500 MG PO TABS: 500.0000 mg | ORAL_TABLET | Freq: Two times a day (BID) | ORAL | 0 refills | Status: AC

## 2021-06-20 NOTE — Telephone Encounter (Signed)
Cipro 500 mg p.o. twice daily for 7 days and Flagyl 500 mg p.o. twice daily for 7 days.  Patient of Dr. Fuller Plan.  Please get this patient an appointment for follow-up with Dr. Fuller Plan for continuity.  I am treating for diverticulitis.

## 2021-06-20 NOTE — Telephone Encounter (Signed)
Dr. Silvio Pate if you fill this is appropriate for the patient please fill  I discuss with him I do not feel comfortable continually refills these antibiotics due to antibiotic resistance and risk for C diff when he feels its necessary   Id previously asked him to call GI when this occurs as well and he is established    ===View-only below this line=== ----- Message ----- From: Kevin Webb, CMA Sent: 06/19/2021   4:58 PM EST To: Kevin Glow McLean-Scocuzza, Kevin Webb Subject: FW: medication                                  ----- Message ----- From: Kevin Mates "Kevin Webb" Sent: 06/19/2021   1:14 PM EST To: [facility] Subject: medication                                     request  a refill on cipro , flagyl,due to about of diverticulitis on set monday  [facility]

## 2021-06-20 NOTE — Telephone Encounter (Signed)
Patient states he is having LLQ abd pain for a week. Patient denies fever. Patient states he has recurrent diverticulitis frequently. Patient reports he tried to reach out to his PCP with a my chart message and was told he has to contact our office for refills of Cipro and Flagyl.  Patient states he was told by Dr. Fuller Plan at his last appt that he can get refills of antibiotics to have on hand when he has a flare up. Informed patient it does not specify that in Dr. Lynne Leader office note. Offered patient an appt and patient declines. Patient states he had one Cipro leftover from his last flare up and took that. Patient states his abd pain is more of a discomfort now. Patient states he still would like refills to have on hand for when he has a flare up. Informed patient I will send a message to the doctor and call him back. DOD Dr. Candis Schatz  please advise.

## 2021-06-20 NOTE — Progress Notes (Signed)
Both prescriptions have been sent to pt's pharmacy. Follow up scheduled for 07/10/21 at 9:10 am with Dr. Fuller Plan. Pt is aware, no further questions.

## 2021-06-22 NOTE — Telephone Encounter (Signed)
Please review pt advice note I sent to [facility] on this patient as well. A

## 2021-06-23 ENCOUNTER — Telehealth: Payer: Self-pay

## 2021-06-23 ENCOUNTER — Other Ambulatory Visit: Payer: Self-pay | Admitting: Internal Medicine

## 2021-06-23 DIAGNOSIS — I10 Essential (primary) hypertension: Secondary | ICD-10-CM

## 2021-06-23 NOTE — Telephone Encounter (Signed)
OK 

## 2021-06-23 NOTE — Telephone Encounter (Signed)
Dr. Fuller Plan, on Friday patient was prescribed both Cipro 500mg  BID & Flagyl 500mg  BID for 7 days each per Dr. Hilarie Fredrickson since he was doc of the day. I followed up with patient this morning to see how he was feeling, and he had no complaints of pain/discomfort. He has a follow up scheduled with you on 07/10/21 at 9:10 am. He said he would like to discuss dicyclomine with you then.

## 2021-07-10 ENCOUNTER — Ambulatory Visit (INDEPENDENT_AMBULATORY_CARE_PROVIDER_SITE_OTHER): Payer: Medicare Other | Admitting: Gastroenterology

## 2021-07-10 ENCOUNTER — Other Ambulatory Visit (INDEPENDENT_AMBULATORY_CARE_PROVIDER_SITE_OTHER): Payer: Medicare Other

## 2021-07-10 ENCOUNTER — Encounter: Payer: Self-pay | Admitting: Gastroenterology

## 2021-07-10 VITALS — BP 144/84 | HR 72 | Ht 68.0 in | Wt 204.4 lb

## 2021-07-10 DIAGNOSIS — R1032 Left lower quadrant pain: Secondary | ICD-10-CM | POA: Diagnosis not present

## 2021-07-10 LAB — COMPREHENSIVE METABOLIC PANEL
ALT: 20 U/L (ref 0–53)
AST: 28 U/L (ref 0–37)
Albumin: 4.5 g/dL (ref 3.5–5.2)
Alkaline Phosphatase: 46 U/L (ref 39–117)
BUN: 8 mg/dL (ref 6–23)
CO2: 30 mEq/L (ref 19–32)
Calcium: 9.7 mg/dL (ref 8.4–10.5)
Chloride: 102 mEq/L (ref 96–112)
Creatinine, Ser: 0.97 mg/dL (ref 0.40–1.50)
GFR: 77.41 mL/min (ref >60.00)
Glucose, Bld: 128 mg/dL — ABNORMAL HIGH (ref 70–99)
Potassium: 4.1 mEq/L (ref 3.5–5.1)
Sodium: 137 mEq/L (ref 135–145)
Total Bilirubin: 1 mg/dL (ref 0.2–1.2)
Total Protein: 7.1 g/dL (ref 6.0–8.3)

## 2021-07-10 LAB — CBC WITH DIFFERENTIAL/PLATELET
Basophils Absolute: 0 10*3/uL (ref 0.0–0.1)
Basophils Relative: 0.2 % (ref 0.0–3.0)
Eosinophils Absolute: 0 10*3/uL (ref 0.0–0.7)
Eosinophils Relative: 0.6 % (ref 0.0–5.0)
HCT: 43 % (ref 39.0–52.0)
Hemoglobin: 14.4 g/dL (ref 13.0–17.0)
Lymphocytes Relative: 19.8 % (ref 12.0–46.0)
Lymphs Abs: 1.1 10*3/uL (ref 0.7–4.0)
MCHC: 33.6 g/dL (ref 30.0–36.0)
MCV: 90.2 fl (ref 78.0–100.0)
Monocytes Absolute: 0.4 10*3/uL (ref 0.1–1.0)
Monocytes Relative: 7.4 % (ref 3.0–12.0)
Neutro Abs: 4.1 10*3/uL (ref 1.4–7.7)
Neutrophils Relative %: 72 % (ref 43.0–77.0)
Platelets: 164 10*3/uL (ref 150.0–400.0)
RBC: 4.76 Mil/uL (ref 4.22–5.81)
RDW: 14.1 % (ref 11.5–15.5)
WBC: 5.7 10*3/uL (ref 4.0–10.5)

## 2021-07-10 MED ORDER — DICYCLOMINE HCL 10 MG PO CAPS: 10.0000 mg | ORAL_CAPSULE | Freq: Three times a day (TID) | ORAL | 11 refills | Status: DC | PRN

## 2021-07-10 MED ORDER — CIPROFLOXACIN HCL 500 MG PO TABS: 500.0000 mg | ORAL_TABLET | Freq: Two times a day (BID) | ORAL | 0 refills | Status: AC

## 2021-07-10 MED ORDER — METRONIDAZOLE 500 MG PO TABS: 500.0000 mg | ORAL_TABLET | Freq: Two times a day (BID) | ORAL | 0 refills | Status: AC

## 2021-07-10 NOTE — Progress Notes (Signed)
° ° °  History of Present Illness: This is a 76 year old male with recurrent left lower quadrant abdominal pain. He was recently treated with a course of Cipro and Flagyl for recurrent LLQ abdominal pain. His symptoms have improved however he has intermittent LUQ pain, L testicle pain, L groin pain that worsen with movement. No changes with meals or bowel movements.    Colonoscopy 05/2020 - One 4 mm polyp in the transverse colon, removed with a cold biopsy forceps. Resected and retrieved. - Two 7 to 8 mm polyps in the sigmoid colon and in the descending colon, removed with a cold snare. Resected and retrieved. - Moderate diverticulosis in the left colon. - Internal hemorrhoids. - The examination was otherwise normal on direct and retroflexion views.   CT AP 05/09/2019 1. No acute findings are noted in the abdomen or pelvis to account for the patient's symptoms. 2. There is colonic diverticulosis, but there are no surrounding inflammatory changes are noted at this time to suggest an acute diverticulitis. 3. Aortic atherosclerosis, in addition to least right coronary artery disease. Assessment for potential risk factor modification, dietary therapy or pharmacologic therapy may be warranted, if clinically indicated. 4. Additional incidental findings, as above.   Current Medications, Allergies, Past Medical History, Past Surgical History, Family History and Social History were reviewed in [employer] record.   Physical Exam: General: Well developed, well nourished, no acute distress Head: Normocephalic and atraumatic Eyes: Sclerae anicteric, EOMI Ears: Normal auditory acuity Mouth: Not examined, mask on during Covid-19 pandemic Lungs: Clear throughout to auscultation Heart: Regular rate and rhythm; no murmurs, rubs or bruits Abdomen: Soft, minimal left groin tenderness and non distended. No masses, hepatosplenomegaly or hernias noted. Normal Bowel sounds Rectal: Not  done Musculoskeletal: Symmetrical with no gross deformities  Pulses:  Normal pulses noted Extremities: No clubbing, cyanosis, edema or deformities noted Neurological: Alert oriented x 4, grossly nonfocal Psychological:  Alert and cooperative. Normal mood and affect   Assessment and Recommendations:  Recurrent LLQ pain improved. Intermittent LUQ pain, L testicle pain and L groin pain that worsen with movement. This does not appear to be GI related.  Urology appt to further evaluate - patient will schedule. Dicyclomine 10 mg po tid prn abdominal pain before using antibiotics. Cipro and Flagyl for 1 week to have at home for 1 emergency use. CBC, CMP today. Schedule CT AP for further evaluation.  Personal history of adenomatous colon polyps.  Surveillance colonoscopy recommended in January 2025.

## 2021-07-10 NOTE — Patient Instructions (Addendum)
Your provider has requested that you go to the basement level for lab work before leaving today. Press "B" on the elevator. The lab is located at the first door on the left as you exit the elevator.  We have sent the following medications to your pharmacy for you to pick up at your convenience: dicyclomine, Cipro and Flagyl.   You have been scheduled for a CT scan of the abdomen and pelvis at [facility], 1st floor Radiology. You are scheduled on 07/16/21  at 3:30pm. You should arrive 15 minutes prior to your appointment time for registration.  Please pick up 2 bottles of contrast from [facility] at least 3 days prior to your scan. The solution may taste better if refrigerated, but do NOT add ice or any other liquid to this solution. Shake well before drinking.   Please follow the written instructions below on the day of your exam:   1) Do not eat anything after 11:30am (4 hours prior to your test)   2) Drink 1 bottle of contrast @ 1:30pm (2 hours prior to your exam)  Remember to shake well before drinking and do NOT pour over ice.     Drink 1 bottle of contrast @ 2:30pm (1 hour prior to your exam)   You may take any medications as prescribed with a small amount of water, if necessary. If you take any of the following medications: METFORMIN, GLUCOPHAGE, GLUCOVANCE, AVANDAMET, RIOMET, FORTAMET, [place] MET, JANUMET, GLUMETZA or METAGLIP, you MAY be asked to HOLD this medication 48 hours AFTER the exam.   The purpose of you drinking the oral contrast is to aid in the visualization of your intestinal tract. The contrast solution may cause some diarrhea. Depending on your individual set of symptoms, you may also receive an intravenous injection of x-ray contrast/dye. Plan on being at [facility] for 45 minutes or longer, depending on the type of exam you are having performed.   If you have any questions regarding your exam or if you need to reschedule, you may call [facility] at  [phone] between the hours of 8:00 am and 5:00 pm, Monday-Friday.   Call your Urologist to schedule an appointment for your groin pain.  The [facility] providers would like to encourage you to use [facility] to communicate with providers for non-urgent requests or questions.  Due to long hold times on the telephone, sending your provider a message by [facility] may be a faster and more efficient way to get a response.  Please allow 48 business hours for a response.  Please remember that this is for non-urgent requests.   Due to recent changes in healthcare laws, you may see the results of your imaging and laboratory studies on MyChart before your provider has had a chance to review them.  We understand that in some cases there may be results that are confusing or concerning to you. Not all laboratory results come back in the same time frame and the provider may be waiting for multiple results in order to interpret others.  Please give [place] 48 hours in order for your provider to thoroughly review all the results before contacting the office for clarification of your results.   Thank you for choosing me and [facility].  Pricilla Riffle. Dagoberto Ligas., MD., [facility]

## 2021-07-16 ENCOUNTER — Other Ambulatory Visit: Payer: Self-pay

## 2021-07-16 ENCOUNTER — Ambulatory Visit (HOSPITAL_COMMUNITY)
Admission: RE | Admit: 2021-07-16 | Discharge: 2021-07-16 | Disposition: A | Discharge status: Home / Self Care | Payer: Medicare Other | Source: Ambulatory Visit | Attending: Gastroenterology | Admitting: Gastroenterology

## 2021-07-16 DIAGNOSIS — R1032 Left lower quadrant pain: Secondary | ICD-10-CM | POA: Insufficient documentation

## 2021-07-16 MED ORDER — IOHEXOL 300 MG/ML  SOLN
100.0000 mL | Freq: Once | INTRAMUSCULAR | Status: AC | PRN
  Administered 2021-07-16: 100 mL via INTRAVENOUS

## 2021-07-16 MED ORDER — SODIUM CHLORIDE (PF) 0.9 % IJ SOLN
INTRAMUSCULAR | Status: AC
  Filled 2021-07-16: qty 50

## 2021-07-22 DIAGNOSIS — E291 Testicular hypofunction: Secondary | ICD-10-CM | POA: Diagnosis not present

## 2021-07-22 DIAGNOSIS — Z79899 Other long term (current) drug therapy: Secondary | ICD-10-CM | POA: Diagnosis not present

## 2021-07-22 DIAGNOSIS — N401 Enlarged prostate with lower urinary tract symptoms: Secondary | ICD-10-CM | POA: Diagnosis not present

## 2021-07-22 DIAGNOSIS — N5201 Erectile dysfunction due to arterial insufficiency: Secondary | ICD-10-CM | POA: Diagnosis not present

## 2021-07-24 DIAGNOSIS — E291 Testicular hypofunction: Secondary | ICD-10-CM | POA: Diagnosis not present

## 2021-07-24 DIAGNOSIS — Z79899 Other long term (current) drug therapy: Secondary | ICD-10-CM | POA: Diagnosis not present

## 2021-07-24 DIAGNOSIS — N5201 Erectile dysfunction due to arterial insufficiency: Secondary | ICD-10-CM | POA: Diagnosis not present

## 2021-07-24 DIAGNOSIS — N401 Enlarged prostate with lower urinary tract symptoms: Secondary | ICD-10-CM | POA: Diagnosis not present

## 2021-07-28 ENCOUNTER — Other Ambulatory Visit: Payer: Self-pay | Admitting: Internal Medicine

## 2021-07-28 DIAGNOSIS — E119 Type 2 diabetes mellitus without complications: Secondary | ICD-10-CM

## 2021-07-28 DIAGNOSIS — E785 Hyperlipidemia, unspecified: Secondary | ICD-10-CM

## 2021-08-28 ENCOUNTER — Other Ambulatory Visit: Payer: Self-pay | Admitting: Internal Medicine

## 2021-09-28 ENCOUNTER — Other Ambulatory Visit: Payer: Self-pay | Admitting: Cardiology

## 2021-10-25 ENCOUNTER — Other Ambulatory Visit: Payer: Self-pay | Admitting: Internal Medicine

## 2021-10-25 DIAGNOSIS — E785 Hyperlipidemia, unspecified: Secondary | ICD-10-CM

## 2021-10-28 ENCOUNTER — Other Ambulatory Visit: Payer: Self-pay

## 2021-10-28 DIAGNOSIS — I2510 Atherosclerotic heart disease of native coronary artery without angina pectoris: Secondary | ICD-10-CM

## 2021-10-28 MED ORDER — NITROGLYCERIN 0.4 MG SL SUBL: 0.4000 mg | SUBLINGUAL_TABLET | SUBLINGUAL | 4 refills | Status: DC | PRN

## 2021-10-31 ENCOUNTER — Ambulatory Visit (INDEPENDENT_AMBULATORY_CARE_PROVIDER_SITE_OTHER): Payer: Medicare Other | Admitting: Internal Medicine

## 2021-10-31 ENCOUNTER — Encounter: Payer: Self-pay | Admitting: Internal Medicine

## 2021-10-31 VITALS — BP 130/80 | HR 61 | Temp 97.8°F | Resp 14 | Ht 68.0 in | Wt 211.0 lb

## 2021-10-31 DIAGNOSIS — E1159 Type 2 diabetes mellitus with other circulatory complications: Secondary | ICD-10-CM | POA: Diagnosis not present

## 2021-10-31 DIAGNOSIS — Z1329 Encounter for screening for other suspected endocrine disorder: Secondary | ICD-10-CM | POA: Diagnosis not present

## 2021-10-31 DIAGNOSIS — K219 Gastro-esophageal reflux disease without esophagitis: Secondary | ICD-10-CM | POA: Diagnosis not present

## 2021-10-31 DIAGNOSIS — I152 Hypertension secondary to endocrine disorders: Secondary | ICD-10-CM

## 2021-10-31 DIAGNOSIS — I10 Essential (primary) hypertension: Secondary | ICD-10-CM

## 2021-10-31 DIAGNOSIS — Z1321 Encounter for screening for nutritional disorder: Secondary | ICD-10-CM | POA: Diagnosis not present

## 2021-10-31 DIAGNOSIS — R5383 Other fatigue: Secondary | ICD-10-CM | POA: Diagnosis not present

## 2021-10-31 DIAGNOSIS — Z130 Encounter for screening for diseases of the blood and blood-forming organs and certain disorders involving the immune mechanism: Secondary | ICD-10-CM

## 2021-10-31 DIAGNOSIS — Z13228 Encounter for screening for other metabolic disorders: Secondary | ICD-10-CM

## 2021-10-31 MED ORDER — PANTOPRAZOLE SODIUM 40 MG PO TBEC: 40.0000 mg | DELAYED_RELEASE_TABLET | Freq: Every day | ORAL | 3 refills | Status: DC

## 2021-10-31 NOTE — Patient Instructions (Addendum)
Latest Reference Range & Units 01/20/20 04:13 06/24/20 08:48 01/28/21 09:06  Hemoglobin A1C 4.6 - 6.5 % 5.9 (H) 6.6 (H) 6.8 (H)  (H): Data is abnormally high  Call and schedule eye MD appt with Dr. Gloriann Loan and have him fax me the records one done [phone]   My last day 02/27/22 Friday Dr. Volanda Napoleon comes in 11/2021 call and schedule f/u in 6 months

## 2021-10-31 NOTE — Progress Notes (Addendum)
Chief Complaint  Patient presents with   Follow-up    Denies any concerns or pain, had eye exam 6 mon ago Dr.Bell, had PSA checked 6 mon ago normal 0.3   F/u  1. DM 2 a1c 6.9 with Htn at times spikes to sbp 160 he is eating pickled beets at times, sardines, olives disc rec stop  On meds for CAD/HTN On norvasc 5 mg qd toprol 25 mg qd, zetia 10, altace 10 mg and imdur 60 mg though at times he states he is taking this bid  He is s/p cabg x 3 and all vessels now blocked and was told to exercise for collateral circulation per pt per cardiology  Dm 2 declines meds and tries to diet control     Review of Systems  Constitutional:  Negative for weight loss.  HENT:  Negative for hearing loss.   Eyes:  Negative for blurred vision.  Respiratory:  Negative for shortness of breath.   Cardiovascular:  Negative for chest pain.  Gastrointestinal:  Negative for abdominal pain and blood in stool.  Genitourinary:  Negative for dysuria.  Musculoskeletal:  Negative for back pain, falls and joint pain.  Skin:  Negative for rash.  Neurological:  Negative for headaches.  Psychiatric/Behavioral:  Negative for depression.    Past Medical History:  Diagnosis Date   Acute cholecystitis 04/18/2013   Adenomatous colon polyp 12/2004   Anemia    Blood transfusion without reported diagnosis    BPH associated with nocturia    CAD (coronary artery disease)    a. s/p CABG x4 in 2000 with LIMA-LAD, reverse SVG-IM, reverse SVG-acute margin and reverse SVG-RCA b. s/p BMS to SVG-RI in 2012 c. 12/2016: PCI/DES to SVG-RCA and PCI/DES to SVG-RI (Dr. [name])    Carotid artery stenosis    mild    Cataract    Clotting disorder ([place])    Diabetes mellitus without complication ([place]) 48/18/5631   recent A1C 6.1 12/25/16 controlled with diet and exercise    Diverticulitis 2008/2009   GERD (gastroesophageal reflux disease)    controlled as of 04/01/17    HSV infection    HTN (hypertension)    Hx of dysplastic nevus  05/04/2018   upper back spinal    Hyperglycemia    Hyperlipidemia    Impingement syndrome of left shoulder 2016   Low testosterone    Dr [name], [place] ,[place]   Myocardial infarction [facility])    Osteoarthritis of right knee    With trace effusion   Peyronie disease    RLS (restless legs syndrome) 08/15/2013   Rotator cuff injury    right; at 74-23 years old.    Sinus bradycardia    Thrombocytopenia (HCC)    Vitamin D deficiency    Past Surgical History:  Procedure Laterality Date   CHOLECYSTECTOMY N/A 04/21/2013   Procedure: LAPAROSCOPIC CHOLECYSTECTOMY WITH INTRAOPERATIVE CHOLANGIOGRAM;  Surgeon: Haywood Lasso, MD;  Location: [place];  Service: General;  Laterality: N/A;   CHOLECYSTECTOMY     COLONOSCOPY W/ POLYPECTOMY  2004 & 2009    X 2; Dr Fuller Plan; due 2014   CORONARY ANGIOPLASTY     CORONARY ARTERY BYPASS GRAFT  04/1999   4 vessels   CORONARY STENT INTERVENTION N/A 12/28/2016   Procedure: CORONARY STENT INTERVENTION;  Surgeon: Lorretta Harp, MD;  Location: [place] [facility];  Service: Cardiovascular;  Laterality: N/A;   CORONARY STENT PLACEMENT  2012   Plavix   LAPAROSCOPIC CHOLECYSTECTOMY  04/21/2013   Dr  Streck; acutecholecystitis with necrosis   LEFT HEART CATH AND CORS/GRAFTS ANGIOGRAPHY N/A 12/28/2016   Procedure: LEFT HEART CATH AND CORS/GRAFTS ANGIOGRAPHY;  Surgeon: Runell Gess, MD;  Location: [facility];  Service: Cardiovascular;  Laterality: N/A;   LEFT HEART CATH AND CORS/GRAFTS ANGIOGRAPHY N/A 12/01/2019   Procedure: LEFT HEART CATH AND CORS/GRAFTS ANGIOGRAPHY;  Surgeon: Swazil[place], Peter M, MD;  Location: [facility];  Service: Cardiovascular;  Laterality: N/A;   VASECTOMY     Family History  Problem Relation Age of Onset   Coronary artery disease Mother        CABG in 66s   CVA Mother        cns bleed from warfarin   Aneurysm Mother    Aortic aneurysm Father 20       ? abdominal   Heart attack Father 46   Cholecystitis Sister     Diabetes Brother    Diabetes Other        PGaunt   Cancer Neg Hx    Colon cancer Neg Hx    Prostate cancer Neg Hx    Esophageal cancer Neg Hx    Stomach cancer Neg Hx    Rectal cancer Neg Hx    Social History   Socioeconomic History   Marital status: Married    Spouse name: Not on file   Number of children: 1   Years of education: Not on file   Highest education level: Not on file  Occupational History   Occupation:      Comment: plumber  Tobacco Use   Smoking status: Never   Smokeless tobacco: Never  Vaping Use   Vaping Use: Never used  Substance and Sexual Activity   Alcohol use: Yes    Alcohol/week: 10.0 - 12.0 standard drinks of alcohol    Types: 10 - 12 Cans of beer per week    Comment: drinks beer daily; whiskey rarely, used to be daily 1-2 shots   Drug use: No   Sexual activity: Yes    Birth control/protection: None  Other Topics Concern   Not on file  Social History Narrative   Remarried 2014   1 son age 74 as of 03/2017       Lives at home with spouse   Right handed   Caffeine: 1-2 cups/day (coffee, green tea)   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[place]    Social Determinants of Health   Financial Resource Strain: Low Risk  (11/27/2020)   Overall Financial Resource Strain (CARDIA)    Difficulty of Paying Living Expenses: Not hard at all  Food Insecurity: No Food Insecurity (11/27/2020)   Hunger Vital Sign    Worried About Running Out of Food in the Last Year: Never true    Ran Out of Food in the Last Year: Never true  Transportation Needs: No Transportation Needs (11/27/2020)   PRAPARE - Administrator, Civil Service (Medical): No    Lack of Transportation (Non-Medical): No  Physical Activity: Sufficiently Active (11/27/2020)   Exercise Vital Sign    Days of Exercise per Week: 7 days    Minutes of Exercise per Session: 60 min  Stress: No Stress Concern Present (11/27/2020)   [employer] of Occupational Health - Occupational Stress  Questionnaire    Feeling of Stress : Not at all  Social Connections: Unknown (11/27/2020)   Social Connection and Isolation Panel [NHANES]    Frequency of Communication with Friends and Family: Not on file  Frequency of Social Gatherings with Friends and Family: Not on file    Attends Religious Services: Not on file    Active Member of Clubs or Organizations: Not on file    Attends Club or Organization Meetings: Not on file    Marital Status: Married  Intimate Partner Violence: Not At Risk (11/27/2020)   Humiliation, Afraid, Rape, and Kick questionnaire    Fear of Current or Ex-Partner: No    Emotionally Abused: No    Physically Abused: No    Sexually Abused: No   Current Meds  Medication Sig   amLODipine (NORVASC) 5 MG tablet TAKE 1 TABLET BY MOUTH DAILY   Ascorbic Acid (VITAMIN C) 1000 MG tablet Take 1,000 mg by mouth daily at 2 am. Reported on 09/26/2015   aspirin EC 81 MG tablet Take 81 mg by mouth daily at 2 am. Swallow whole.   cholecalciferol (VITAMIN D) 25 MCG (1000 UNIT) tablet Take 1,000 Units by mouth daily at 2 am.   clopidogrel (PLAVIX) 75 MG tablet TAKE 1 TABLET BY MOUTH DAILY   Coenzyme Q10 (CO Q-10 PO) Take 1 capsule by mouth daily at 2 am.   dicyclomine (BENTYL) 10 MG capsule Take 1 capsule (10 mg total) by mouth 3 (three) times daily with meals as needed for spasms.   ezetimibe (ZETIA) 10 MG tablet TAKE 1 TABLET BY MOUTH DAILY   GINKGO BILOBA PO Take 1 capsule by mouth daily at 2 am.    isosorbide mononitrate (IMDUR) 60 MG 24 hr tablet TAKE 1 TABLET BY MOUTH DAILY   MAGNESIUM PO Take 1 tablet by mouth daily at 2 am.   metoprolol succinate (TOPROL-XL) 25 MG 24 hr tablet TAKE 1 TABLET BY MOUTH DAILY   Multiple Vitamin (MULTIVITAMIN WITH MINERALS) TABS tablet Take 1 tablet by mouth daily at 2 am.   niacin 250 MG tablet Take 250 mg by mouth daily.   nitroGLYCERIN (NITROSTAT) 0.4 MG SL tablet Place 1 tablet (0.4 mg total) under the tongue every 5 (five) minutes x 3 doses  as needed.   Omega-3 Fatty Acids (FISH OIL) 1000 MG CAPS Take 2,000 mg by mouth daily at 2 am.    pantoprazole (PROTONIX) 40 MG tablet Take 1 tablet (40 mg total) by mouth daily at 2 am. 30 minutes.   ramipril (ALTACE) 10 MG capsule TAKE 1 CAPSULE BY MOUTH ONCE DAILY   rosuvastatin (CRESTOR) 40 MG tablet TAKE 1 TABLET AT BEDTIME   tamsulosin (FLOMAX) 0.4 MG CAPS capsule Take 0.4 mg by mouth daily at 2 am.    vitamin E 100 UNIT capsule Take 100 Units by mouth daily at 2 am.    Current Facility-Administered Medications for the 10/31/21 encounter (Office Visit) with McLean-Scocuzza, Nino Glow, MD  Medication   sodium chloride flush (NS) 0.9 % injection 3 mL   Allergies  Allergen Reactions   Darvocet [Propoxyphene N-Acetaminophen] Hives   Propoxyphene Hives   No results found for this or any previous visit (from the past 2160 hour(s)). Objective  Body mass index is 32.08 kg/m. Wt Readings from Last 3 Encounters:  10/31/21 211 lb (95.7 kg)  07/10/21 204 lb 6 oz (92.7 kg)  04/18/21 211 lb (95.7 kg)   Temp Readings from Last 3 Encounters:  10/31/21 97.8 F (36.6 C) (Oral)  12/25/20 97.8 F (36.6 C) (Temporal)  10/13/20 98.4 F (36.9 C) (Oral)   BP Readings from Last 3 Encounters:  10/31/21 130/80  07/10/21 (!) 144/84  04/18/21 128/73  Pulse Readings from Last 3 Encounters:  10/31/21 61  07/10/21 72  04/18/21 64    Physical Exam Vitals and nursing note reviewed.  Constitutional:      Appearance: Normal appearance. He is well-developed and well-groomed.  HENT:     Head: Normocephalic and atraumatic.  Eyes:     Conjunctiva/sclera: Conjunctivae normal.     Pupils: Pupils are equal, round, and reactive to light.  Cardiovascular:     Rate and Rhythm: Normal rate and regular rhythm.     Heart sounds: Normal heart sounds.  Pulmonary:     Effort: Pulmonary effort is normal. No respiratory distress.     Breath sounds: Normal breath sounds.  Abdominal:     Tenderness: There  is no abdominal tenderness.  Skin:    General: Skin is warm and moist.  Neurological:     General: No focal deficit present.     Mental Status: He is alert and oriented to person, place, and time. Mental status is at baseline.     Sensory: Sensation is intact.     Motor: Motor function is intact.     Coordination: Coordination is intact.     Gait: Gait is intact. Gait normal.  Psychiatric:        Attention and Perception: Attention and perception normal.        Mood and Affect: Mood and affect normal.        Speech: Speech normal.        Behavior: Behavior normal. Behavior is cooperative.        Thought Content: Thought content normal.        Cognition and Memory: Cognition and memory normal.        Judgment: Judgment normal.     Assessment  Plan  Dm 2 a1c 6.8 and Primary hypertension controlled today rec dash diet - Plan: Lipid panel, Comprehensive metabolic panel, CBC with Differential/Platelet norvasc 5 mg qd toprol 25 mg qd, zetia 10, altace 10 mg and imdur 60 mg though at times he states he is taking this bid   Gastroesophageal reflux disease - Plan: pantoprazole (PROTONIX) 40 MG tablet   HM  flu shot utd - utd zostavax, prevnar, pna 23 Tdap utd   Rx  shingrix mailed prev Consider covid 19 vaccine declines as of 06/26/20   Consider hep B vaccine not immune MMR immune -Dr. Rogers Blocker urology   Results for TRES, GRZYWACZ "GENE" (MRN [id number]) as of 06/26/2020 14:42   Ref. Range 06/24/2020 08:48  PSA Latest Ref Range: 0.10 - 4.00 ng/ml 1.72   PSA 10/31/21 get report Dr. Rogers Blocker  Psa 07/24/21 0.7 Dr. Rogers Blocker  Had eye exam Dr. Gloriann Loan  needs to call and schedule as of 10/31/21   Never smoker no CT chest [place] 04/2013 abdominal aorta normal   Colonoscopy had 05/02/15 Dr. Fuller Plan sessile polyp repeat 04/2020 per report . Saw Dr. Silvio Pate 01/2018 and 08/23/2018 and 10/03/18 had upper endoscopy negative bx   Colonoscopy had 06/06/20 tubular adenoma f/u in 3 year   saw derm 05/03/17 noted SK,  lentigos, rosacea, Aks face and arms f/u in 1 year [facility]. [place].      Cards Dr. [name] f/u 04/22/20, 04/2021   Provider: Dr. Olivia Mackie  [facility]

## 2021-11-03 ENCOUNTER — Telehealth: Payer: Self-pay | Admitting: *Deleted

## 2021-11-03 MED ORDER — ISOSORBIDE MONONITRATE ER 120 MG PO TB24: 120.0000 mg | ORAL_TABLET | Freq: Every day | ORAL | 3 refills | Status: DC

## 2021-11-03 NOTE — Telephone Encounter (Signed)
-----   Message from Minus Breeding, MD sent at 11/02/2021  3:38 PM EDT ----- Olivia Mackie,  He has a patent LIMA to the LAD.  We can increase his Imdur to 120 mg and we will tell him it needs to be once daily.   ----- Message ----- From: McLean-Scocuzza, Nino Glow, MD Sent: 10/31/2021   1:22 PM EDT To: Minus Breeding, MD  Pt BP elevating at times 160s sbp and taking imdur 60 mg bid but Rx 60 qd  What do you suggest? With h/o CAD bypass x 3 and they are all blocked?   So if his BP spikes can you help me what do you rec?

## 2021-11-03 NOTE — Telephone Encounter (Signed)
Spoke with pt, Aware of dr hochrein's recommendations.  New script sent to the pharmacy  Follow up scheduled per patient request. He will bring bp chart and bp machine to the follow up appointment.

## 2021-11-11 ENCOUNTER — Other Ambulatory Visit (INDEPENDENT_AMBULATORY_CARE_PROVIDER_SITE_OTHER): Payer: Medicare Other

## 2021-11-11 ENCOUNTER — Other Ambulatory Visit: Payer: Self-pay

## 2021-11-11 DIAGNOSIS — E1159 Type 2 diabetes mellitus with other circulatory complications: Secondary | ICD-10-CM | POA: Diagnosis not present

## 2021-11-11 DIAGNOSIS — R7989 Other specified abnormal findings of blood chemistry: Secondary | ICD-10-CM

## 2021-11-11 DIAGNOSIS — Z1321 Encounter for screening for nutritional disorder: Secondary | ICD-10-CM

## 2021-11-11 DIAGNOSIS — Z1329 Encounter for screening for other suspected endocrine disorder: Secondary | ICD-10-CM

## 2021-11-11 DIAGNOSIS — Z130 Encounter for screening for diseases of the blood and blood-forming organs and certain disorders involving the immune mechanism: Secondary | ICD-10-CM | POA: Diagnosis not present

## 2021-11-11 DIAGNOSIS — R5383 Other fatigue: Secondary | ICD-10-CM

## 2021-11-11 DIAGNOSIS — I10 Essential (primary) hypertension: Secondary | ICD-10-CM

## 2021-11-11 DIAGNOSIS — I152 Hypertension secondary to endocrine disorders: Secondary | ICD-10-CM

## 2021-11-11 DIAGNOSIS — Z13228 Encounter for screening for other metabolic disorders: Secondary | ICD-10-CM

## 2021-11-11 LAB — CBC WITH DIFFERENTIAL/PLATELET
Basophils Absolute: 0 10*3/uL (ref 0.0–0.1)
Basophils Relative: 0.2 % (ref 0.0–3.0)
Eosinophils Absolute: 0 10*3/uL (ref 0.0–0.7)
Eosinophils Relative: 0.5 % (ref 0.0–5.0)
HCT: 42.4 % (ref 39.0–52.0)
Hemoglobin: 14.2 g/dL (ref 13.0–17.0)
Lymphocytes Relative: 19.4 % (ref 12.0–46.0)
Lymphs Abs: 1.3 10*3/uL (ref 0.7–4.0)
MCHC: 33.4 g/dL (ref 30.0–36.0)
MCV: 92.7 fl (ref 78.0–100.0)
Monocytes Absolute: 0.5 10*3/uL (ref 0.1–1.0)
Monocytes Relative: 7.3 % (ref 3.0–12.0)
Neutro Abs: 4.7 10*3/uL (ref 1.4–7.7)
Neutrophils Relative %: 72.6 % (ref 43.0–77.0)
Platelets: 156 10*3/uL (ref 150.0–400.0)
RBC: 4.57 Mil/uL (ref 4.22–5.81)
RDW: 14.4 % (ref 11.5–15.5)
WBC: 6.5 10*3/uL (ref 4.0–10.5)

## 2021-11-11 LAB — HEMOGLOBIN A1C: Hgb A1c MFr Bld: 6.5 % (ref 4.6–6.5)

## 2021-11-11 LAB — LIPID PANEL
Cholesterol: 138 mg/dL (ref 0–200)
HDL: 53.9 mg/dL (ref >39.00)
LDL Cholesterol: 63 mg/dL (ref 0–99)
NonHDL: 83.88
Total CHOL/HDL Ratio: 3
Triglycerides: 106 mg/dL (ref 0.0–149.0)
VLDL: 21.2 mg/dL (ref 0.0–40.0)

## 2021-11-11 LAB — COMPREHENSIVE METABOLIC PANEL
ALT: 17 U/L (ref 0–53)
AST: 22 U/L (ref 0–37)
Albumin: 4.1 g/dL (ref 3.5–5.2)
Alkaline Phosphatase: 46 U/L (ref 39–117)
BUN: 10 mg/dL (ref 6–23)
CO2: 25 mEq/L (ref 19–32)
Calcium: 8.8 mg/dL (ref 8.4–10.5)
Chloride: 102 mEq/L (ref 96–112)
Creatinine, Ser: 0.85 mg/dL (ref 0.40–1.50)
GFR: 85.96 mL/min (ref >60.00)
Glucose, Bld: 109 mg/dL — ABNORMAL HIGH (ref 70–99)
Potassium: 4.2 mEq/L (ref 3.5–5.1)
Sodium: 136 mEq/L (ref 135–145)
Total Bilirubin: 1 mg/dL (ref 0.2–1.2)
Total Protein: 6.3 g/dL (ref 6.0–8.3)

## 2021-11-11 LAB — MICROALBUMIN / CREATININE URINE RATIO
Creatinine,U: 24.2 mg/dL
Microalb Creat Ratio: 2.9 mg/g (ref 0.0–30.0)
Microalb, Ur: <0.7 mg/dL (ref 0.0–1.9)

## 2021-11-11 LAB — TSH: TSH: 1.29 u[IU]/mL (ref 0.35–5.50)

## 2021-11-11 LAB — TESTOSTERONE: Testosterone: 480.76 ng/dL (ref 300.00–890.00)

## 2021-11-12 LAB — URINALYSIS, ROUTINE W REFLEX MICROSCOPIC
Bilirubin Urine: NEGATIVE
Glucose, UA: NEGATIVE
Hgb urine dipstick: NEGATIVE
Ketones, ur: NEGATIVE
Leukocytes,Ua: NEGATIVE
Nitrite: NEGATIVE
Protein, ur: NEGATIVE
Specific Gravity, Urine: 1.004 (ref 1.001–1.035)
pH: 6.5 (ref 5.0–8.0)

## 2021-11-27 NOTE — Progress Notes (Signed)
Cardiology Office Note   Date:  11/28/2021   ID:  AYUB KIRSH, DOB Jul 26, 1945, MRN [id number]  PCP:  McLean-Scocuzza, Nino Glow, MD  Cardiologist:   Minus Breeding, MD   No chief complaint on file.      History of Present Illness: Kevin Webb is a 75 y.o. male who presents for evaluation of CAD.   He last had a cath in July of 2021 with results as below.  Since I last saw him he called and said he had elevated BP.  We increased his Imdur.  The patient denies any new symptoms such as chest discomfort, neck or arm discomfort. There has been no new shortness of breath, PND or orthopnea. There have been no reported palpitations, presyncope or syncope. He is still active on his job.      Past Medical History:  Diagnosis Date   Acute cholecystitis 04/18/2013   Adenomatous colon polyp 12/2004   Anemia    Blood transfusion without reported diagnosis    BPH associated with nocturia    CAD (coronary artery disease)    a. s/p CABG x4 in 2000 with LIMA-LAD, reverse SVG-IM, reverse SVG-acute margin and reverse SVG-RCA b. s/p BMS to SVG-RI in 2012 c. 12/2016: PCI/DES to SVG-RCA and PCI/DES to SVG-RI (Dr. [name])    Carotid artery stenosis    mild    Cataract    Clotting disorder ([place])    Diabetes mellitus without complication ([place]) 32/99/2426   recent A1C 6.1 12/25/16 controlled with diet and exercise    Diverticulitis 2008/2009   GERD (gastroesophageal reflux disease)    controlled as of 04/01/17    HSV infection    HTN (hypertension)    Hx of dysplastic nevus 05/04/2018   upper back spinal    Hyperglycemia    Hyperlipidemia    Impingement syndrome of left shoulder 2016   Low testosterone    Dr [name], [place] ,[place]   Myocardial infarction [facility])    Osteoarthritis of right knee    With trace effusion   Peyronie disease    RLS (restless legs syndrome) 08/15/2013   Rotator cuff injury    right; at 52-32 years old.    Sinus bradycardia    Thrombocytopenia (HCC)    Vitamin  D deficiency     Past Surgical History:  Procedure Laterality Date   CHOLECYSTECTOMY N/A 04/21/2013   Procedure: LAPAROSCOPIC CHOLECYSTECTOMY WITH INTRAOPERATIVE CHOLANGIOGRAM;  Surgeon: Haywood Lasso, MD;  Location: [place];  Service: General;  Laterality: N/A;   CHOLECYSTECTOMY     COLONOSCOPY W/ POLYPECTOMY  2004 & 2009    X 2; Dr Fuller Plan; due 2014   CORONARY ANGIOPLASTY     CORONARY ARTERY BYPASS GRAFT  04/1999   4 vessels   CORONARY STENT INTERVENTION N/A 12/28/2016   Procedure: CORONARY STENT INTERVENTION;  Surgeon: Lorretta Harp, MD;  Location: [place] [facility];  Service: Cardiovascular;  Laterality: N/A;   CORONARY STENT PLACEMENT  2012   Plavix   LAPAROSCOPIC CHOLECYSTECTOMY  04/21/2013   Dr Margot Chimes; acutecholecystitis with necrosis   LEFT HEART CATH AND CORS/GRAFTS ANGIOGRAPHY N/A 12/28/2016   Procedure: LEFT HEART CATH AND CORS/GRAFTS ANGIOGRAPHY;  Surgeon: Lorretta Harp, MD;  Location: [place] [facility];  Service: Cardiovascular;  Laterality: N/A;   LEFT HEART CATH AND CORS/GRAFTS ANGIOGRAPHY N/A 12/01/2019   Procedure: LEFT HEART CATH AND CORS/GRAFTS ANGIOGRAPHY;  Surgeon: Martinique, Peter M, MD;  Location: [place] [facility];  Service: Cardiovascular;  Laterality: N/A;   VASECTOMY       Current Outpatient Medications  Medication Sig Dispense Refill   amLODipine (NORVASC) 5 MG tablet TAKE 1 TABLET BY MOUTH DAILY 90 tablet 3   Ascorbic Acid (VITAMIN C) 1000 MG tablet Take 1,000 mg by mouth daily at 2 am. Reported on 09/26/2015     aspirin EC 81 MG tablet Take 81 mg by mouth daily at 2 am. Swallow whole.     cholecalciferol (VITAMIN D) 25 MCG (1000 UNIT) tablet Take 1,000 Units by mouth daily at 2 am.     clopidogrel (PLAVIX) 75 MG tablet TAKE 1 TABLET BY MOUTH DAILY 90 tablet 3   Coenzyme Q10 (CO Q-10 PO) Take 1 capsule by mouth daily at 2 am.     dicyclomine (BENTYL) 10 MG capsule Take 1 capsule (10 mg total) by mouth 3 (three) times daily with meals as needed for  spasms. 90 capsule 11   ezetimibe (ZETIA) 10 MG tablet TAKE 1 TABLET BY MOUTH DAILY 90 tablet 3   GINKGO BILOBA PO Take 1 capsule by mouth daily at 2 am.      isosorbide mononitrate (IMDUR) 120 MG 24 hr tablet Take 1 tablet (120 mg total) by mouth daily. 90 tablet 3   MAGNESIUM PO Take 1 tablet by mouth daily at 2 am.     metoprolol succinate (TOPROL-XL) 25 MG 24 hr tablet TAKE 1 TABLET BY MOUTH DAILY 90 tablet 3   Multiple Vitamin (MULTIVITAMIN WITH MINERALS) TABS tablet Take 1 tablet by mouth daily at 2 am.     niacin 250 MG tablet Take 250 mg by mouth daily.     nitroGLYCERIN (NITROSTAT) 0.4 MG SL tablet Place 1 tablet (0.4 mg total) under the tongue every 5 (five) minutes x 3 doses as needed. 25 tablet 4   Omega-3 Fatty Acids (FISH OIL) 1000 MG CAPS Take 2,000 mg by mouth daily at 2 am.      pantoprazole (PROTONIX) 40 MG tablet Take 1 tablet (40 mg total) by mouth daily at 2 am. 30 minutes. 90 tablet 3   ramipril (ALTACE) 10 MG capsule TAKE 1 CAPSULE BY MOUTH ONCE DAILY 90 capsule 3   rosuvastatin (CRESTOR) 40 MG tablet TAKE 1 TABLET AT BEDTIME 90 tablet 3   tamsulosin (FLOMAX) 0.4 MG CAPS capsule Take 0.4 mg by mouth daily at 2 am.      vitamin E 100 UNIT capsule Take 100 Units by mouth daily at 2 am.      Current Facility-Administered Medications  Medication Dose Route Frequency Provider Last Rate Last Admin   sodium chloride flush (NS) 0.9 % injection 3 mL  3 mL Intravenous Q12H Almyra Deforest, PA        Allergies:   Darvocet [propoxyphene n-acetaminophen] and Propoxyphene    ROS:  Please see the history of present illness.   Otherwise, review of systems are positive for none.   All other systems are reviewed and negative.    PHYSICAL EXAM: VS:  BP 124/68   Pulse 66   Ht '5\' 8"'$  (1.727 m)   Wt 209 lb (94.8 kg)   SpO2 98%   BMI 31.78 kg/m  , BMI Body mass index is 31.78 kg/m. GENERAL:  Well appearing NECK:  No jugular venous distention, waveform within normal limits, carotid  upstroke brisk and symmetric, no bruits, no thyromegaly LUNGS:  Clear to auscultation bilaterally CHEST:  Well healed sternotomy scar. HEART:  PMI not displaced or sustained,S1 and  S2 within normal limits, no S3, no S4, no clicks, no rubs, no murmurs ABD:  Flat, positive bowel sounds normal in frequency in pitch, no bruits, no rebound, no guarding, no midline pulsatile mass, no hepatomegaly, no splenomegaly EXT:  2 plus pulses throughout, no edema, no cyanosis no clubbing   EKG:  EKG  ordered today. Sinus rhythm, rate 66, right bundle branch block, left anterior fascicular block   Recent Labs: 11/11/2021: ALT 17; BUN 10; Creatinine, Ser 0.85; Hemoglobin 14.2; Platelets 156.0; Potassium 4.2; Sodium 136; TSH 1.29   Lab Results  Component Value Date   HGBA1C 6.5 11/11/2021     Lipid Panel    Component Value Date/Time   CHOL 138 11/11/2021 1004   TRIG 106.0 11/11/2021 1004   HDL 53.90 11/11/2021 1004   CHOLHDL 3 11/11/2021 1004   VLDL 21.2 11/11/2021 1004   LDLCALC 63 11/11/2021 1004   LDLDIRECT 74.0 10/24/2019 0912      Wt Readings from Last 3 Encounters:  11/28/21 209 lb (94.8 kg)  10/31/21 211 lb (95.7 kg)  07/10/21 204 lb 6 oz (92.7 kg)     Cath   12/01/19   Ost LAD to Prox LAD lesion is 100% stenosed. 1st Diag lesion is 60% stenosed. Ramus lesion is 100% stenosed. Prox Cx lesion is 40% stenosed. Prox RCA lesion is 100% stenosed. Mid RCA lesion is 100% stenosed. Prox Graft lesion is 100% stenosed. Prox Graft lesion is 100% stenosed. Origin to Prox Graft lesion is 100% stenosed. The left ventricular systolic function is normal. LV end diastolic pressure is normal. The left ventricular ejection fraction is 55-65% by visual estimate.    Other studies Reviewed: Additional studies/ records that were reviewed today include: ED records Review of the above records demonstrates:  Please see elsewhere in the note.     ASSESSMENT AND PLAN:   CAD/CABG-  The patient  has no new sypmtoms.  No further cardiovascular testing is indicated.  We will continue with aggressive risk reduction and meds as listed.  HYPERLIPIDEMIA -  Goal LDL 53.9.  No change in therapy.   HYPERTENSION -  Blood pressures is controlled.  Continue current meds.   DM  A1c was down to 6.5.  No change in therapy.   Current medicines are reviewed at length with the patient today.  The patient does not have concerns regarding medicines.  The following changes have been made:   None  Labs/ tests ordered today include:    None  No orders of the defined types were placed in this encounter.     Disposition:   FU with me in 12 months.   Signed, Minus Breeding, MD  11/28/2021 7:52 AM    [facility]

## 2021-11-28 ENCOUNTER — Encounter: Payer: Self-pay | Admitting: Cardiology

## 2021-11-28 ENCOUNTER — Ambulatory Visit (INDEPENDENT_AMBULATORY_CARE_PROVIDER_SITE_OTHER): Payer: Medicare Other | Admitting: Cardiology

## 2021-11-28 VITALS — BP 124/68 | HR 66 | Ht 68.0 in | Wt 209.0 lb

## 2021-11-28 DIAGNOSIS — E118 Type 2 diabetes mellitus with unspecified complications: Secondary | ICD-10-CM | POA: Diagnosis not present

## 2021-11-28 DIAGNOSIS — I10 Essential (primary) hypertension: Secondary | ICD-10-CM | POA: Diagnosis not present

## 2021-11-28 DIAGNOSIS — I25709 Atherosclerosis of coronary artery bypass graft(s), unspecified, with unspecified angina pectoris: Secondary | ICD-10-CM

## 2021-11-28 DIAGNOSIS — E785 Hyperlipidemia, unspecified: Secondary | ICD-10-CM | POA: Diagnosis not present

## 2021-11-28 DIAGNOSIS — I2510 Atherosclerotic heart disease of native coronary artery without angina pectoris: Secondary | ICD-10-CM | POA: Diagnosis not present

## 2021-11-28 NOTE — Patient Instructions (Signed)

## 2021-12-01 ENCOUNTER — Ambulatory Visit (INDEPENDENT_AMBULATORY_CARE_PROVIDER_SITE_OTHER): Payer: Medicare Other

## 2021-12-01 VITALS — Ht 68.0 in | Wt 209.0 lb

## 2021-12-01 DIAGNOSIS — Z0000 Encounter for general adult medical examination without abnormal findings: Secondary | ICD-10-CM

## 2021-12-01 NOTE — Patient Instructions (Addendum)
  [name] , Thank you for taking time to come for your Medicare Wellness Visit. I appreciate your ongoing commitment to your health goals. Please review the following plan we discussed and let me know if I can assist you in the future.   These are the goals we discussed:  Goals       Patient Stated     Follow up with Primary Care Provider (pt-stated)      As needed        This is a list of the screening recommended for you and due dates:  Health Maintenance  Topic Date Due   Eye exam for diabetics  12/14/2021   Flu Shot  12/16/2021   Hemoglobin A1C  05/13/2022   Colon Cancer Screening  06/07/2023   Tetanus Vaccine  06/26/2030   Pneumonia Vaccine  Completed   Hepatitis C Screening: USPSTF Recommendation to screen - Ages 18-79 yo.  Completed   HPV Vaccine  Aged Out   Complete foot exam   Discontinued   COVID-19 Vaccine  Discontinued   Zoster (Shingles) Vaccine  Discontinued

## 2021-12-01 NOTE — Progress Notes (Signed)
Subjective:   Kevin Webb is a 76 y.o. male who presents for Medicare Annual/Subsequent preventive examination.  Review of Systems    No ROS.  Medicare Wellness Virtual Visit.  Visual/audio telehealth visit, UTA vital signs.   See social history for additional risk factors.   Cardiac Risk Factors include: advanced age (>37mn, >>103women);male gender;hypertension;diabetes mellitus     Objective:    Today's Vitals   12/01/21 1010  Weight: 209 lb (94.8 kg)  Height: '5\' 8"'$  (1.727 m)   Body mass index is 31.78 kg/m.     12/01/2021   10:32 AM 11/27/2020    3:56 PM 10/12/2020    7:48 PM 01/20/2020    3:00 AM 12/01/2019    5:58 AM 11/22/2019    1:41 PM 11/15/2018   10:39 AM  Advanced Directives  Does Patient Have a Medical Advance Directive? Yes Yes No No Yes Yes Yes  Type of AParamedicof AWintersLiving will [place] will   [place] will [place] will Living will;Healthcare Power of Attorney  Does patient want to make changes to medical advance directive? No - Patient declined No - Patient declined   No - Patient declined No - Patient declined No - Patient declined  Copy of [place] Chart? Yes - validated most recent copy scanned in chart (See row information) Yes - validated most recent copy scanned in chart (See row information)   No - copy requested Yes - validated most recent copy scanned in chart (See row information) Yes - validated most recent copy scanned in chart (See row information)  Would patient like information on creating a medical advance directive?   No - Patient declined No - Patient declined       Current Medications (verified) Outpatient Encounter Medications as of 12/01/2021  Medication Sig   amLODipine (NORVASC) 5 MG tablet TAKE 1 TABLET BY MOUTH DAILY   Ascorbic Acid (VITAMIN C) 1000 MG tablet Take 1,000 mg by mouth daily at 2 am.  Reported on 09/26/2015   aspirin EC 81 MG tablet Take 81 mg by mouth daily at 2 am. Swallow whole.   cholecalciferol (VITAMIN D) 25 MCG (1000 UNIT) tablet Take 1,000 Units by mouth daily at 2 am.   clopidogrel (PLAVIX) 75 MG tablet TAKE 1 TABLET BY MOUTH DAILY   Coenzyme Q10 (CO Q-10 PO) Take 1 capsule by mouth daily at 2 am.   dicyclomine (BENTYL) 10 MG capsule Take 1 capsule (10 mg total) by mouth 3 (three) times daily with meals as needed for spasms.   ezetimibe (ZETIA) 10 MG tablet TAKE 1 TABLET BY MOUTH DAILY   GINKGO BILOBA PO Take 1 capsule by mouth daily at 2 am.    isosorbide mononitrate (IMDUR) 120 MG 24 hr tablet Take 1 tablet (120 mg total) by mouth daily.   MAGNESIUM PO Take 1 tablet by mouth daily at 2 am.   metoprolol succinate (TOPROL-XL) 25 MG 24 hr tablet TAKE 1 TABLET BY MOUTH DAILY   Multiple Vitamin (MULTIVITAMIN WITH MINERALS) TABS tablet Take 1 tablet by mouth daily at 2 am.   niacin 250 MG tablet Take 250 mg by mouth daily.   nitroGLYCERIN (NITROSTAT) 0.4 MG SL tablet Place 1 tablet (0.4 mg total) under the tongue every 5 (five) minutes x 3 doses as needed.   Omega-3 Fatty Acids (FISH OIL) 1000 MG CAPS Take 2,000 mg by mouth daily at  2 am.    pantoprazole (PROTONIX) 40 MG tablet Take 1 tablet (40 mg total) by mouth daily at 2 am. 30 minutes.   ramipril (ALTACE) 10 MG capsule TAKE 1 CAPSULE BY MOUTH ONCE DAILY   rosuvastatin (CRESTOR) 40 MG tablet TAKE 1 TABLET AT BEDTIME   tamsulosin (FLOMAX) 0.4 MG CAPS capsule Take 0.4 mg by mouth daily at 2 am.    vitamin E 100 UNIT capsule Take 100 Units by mouth daily at 2 am.    Facility-Administered Encounter Medications as of 12/01/2021  Medication   sodium chloride flush (NS) 0.9 % injection 3 mL    Allergies (verified) Darvocet [propoxyphene n-acetaminophen] and Propoxyphene   History: Past Medical History:  Diagnosis Date   Acute cholecystitis 04/18/2013   Adenomatous colon polyp 12/2004   Anemia    Blood transfusion  without reported diagnosis    BPH associated with nocturia    CAD (coronary artery disease)    a. s/p CABG x4 in 2000 with LIMA-LAD, reverse SVG-IM, reverse SVG-acute margin and reverse SVG-RCA b. s/p BMS to SVG-RI in 2012 c. 12/2016: PCI/DES to SVG-RCA and PCI/DES to SVG-RI (Dr. [name])    Carotid artery stenosis    mild    Cataract    Clotting disorder ([place])    Diabetes mellitus without complication ([place]) 59/93/5701   recent A1C 6.1 12/25/16 controlled with diet and exercise    Diverticulitis 2008/2009   GERD (gastroesophageal reflux disease)    controlled as of 04/01/17    HSV infection    HTN (hypertension)    Hx of dysplastic nevus 05/04/2018   upper back spinal    Hyperglycemia    Hyperlipidemia    Impingement syndrome of left shoulder 2016   Low testosterone    Dr [name], [place] ,[place]   Myocardial infarction [facility])    Osteoarthritis of right knee    With trace effusion   Peyronie disease    RLS (restless legs syndrome) 08/15/2013   Rotator cuff injury    right; at 38-79 years old.    Sinus bradycardia    Thrombocytopenia (HCC)    Vitamin D deficiency    Past Surgical History:  Procedure Laterality Date   CHOLECYSTECTOMY N/A 04/21/2013   Procedure: LAPAROSCOPIC CHOLECYSTECTOMY WITH INTRAOPERATIVE CHOLANGIOGRAM;  Surgeon: Haywood Lasso, MD;  Location: [facility];  Service: General;  Laterality: N/A;   CHOLECYSTECTOMY     COLONOSCOPY W/ POLYPECTOMY  2004 & 2009    X 2; Dr Fuller Plan; due 2014   CORONARY ANGIOPLASTY     CORONARY ARTERY BYPASS GRAFT  04/1999   4 vessels   CORONARY STENT INTERVENTION N/A 12/28/2016   Procedure: CORONARY STENT INTERVENTION;  Surgeon: Lorretta Harp, MD;  Location: [facility];  Service: Cardiovascular;  Laterality: N/A;   CORONARY STENT PLACEMENT  2012   Plavix   LAPAROSCOPIC CHOLECYSTECTOMY  04/21/2013   Dr Margot Chimes; acutecholecystitis with necrosis   LEFT HEART CATH AND CORS/GRAFTS ANGIOGRAPHY N/A 12/28/2016   Procedure: LEFT HEART  CATH AND CORS/GRAFTS ANGIOGRAPHY;  Surgeon: Lorretta Harp, MD;  Location: [facility];  Service: Cardiovascular;  Laterality: N/A;   LEFT HEART CATH AND CORS/GRAFTS ANGIOGRAPHY N/A 12/01/2019   Procedure: LEFT HEART CATH AND CORS/GRAFTS ANGIOGRAPHY;  Surgeon: Martinique, Peter M, MD;  Location: [facility];  Service: Cardiovascular;  Laterality: N/A;   VASECTOMY     Family History  Problem Relation Age of Onset   Coronary artery disease Mother  CABG in 85s   CVA Mother        cns bleed from warfarin   Aneurysm Mother    Aortic aneurysm Father 18       ? abdominal   Heart attack Father 30   Cholecystitis Sister    Diabetes Brother    Diabetes Other        PGaunt   Cancer Neg Hx    Colon cancer Neg Hx    Prostate cancer Neg Hx    Esophageal cancer Neg Hx    Stomach cancer Neg Hx    Rectal cancer Neg Hx    Social History   Socioeconomic History   Marital status: Married    Spouse name: Not on file   Number of children: 1   Years of education: Not on file   Highest education level: Not on file  Occupational History   Occupation:      Comment: plumber  Tobacco Use   Smoking status: Never   Smokeless tobacco: Never  Vaping Use   Vaping Use: Never used  Substance and Sexual Activity   Alcohol use: Yes    Alcohol/week: 10.0 - 12.0 standard drinks of alcohol    Types: 10 - 12 Cans of beer per week    Comment: drinks beer daily; whiskey rarely, used to be daily 1-2 shots   Drug use: No   Sexual activity: Yes    Birth control/protection: None  Other Topics Concern   Not on file  Social History Narrative   Remarried 2014   1 son age 65 as of 03/2017       Lives at home with spouse   Right handed   Caffeine: 1-2 cups/day (coffee, green tea)   Former Engineer, 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[place]    Social Determinants of [place] Strain: [place]  (12/01/2021)   Overall Financial Resource Strain (CARDIA)    Difficulty of Paying Living Expenses: Not  hard at all  Food Insecurity: No Food Insecurity (12/01/2021)   Hunger Vital Sign    Worried About Running Out of Food in the Last Year: Never true    [place] in the Last Year: Never true  Transportation Needs: No Transportation Needs (12/01/2021)   PRAPARE - Hydrologist (Medical): No    Lack of Transportation (Non-Medical): No  Physical Activity: Sufficiently Active (12/01/2021)   Exercise Vital Sign    Days of Exercise per Week: 7 days    Minutes of Exercise per Session: 60 min  Stress: No Stress Concern Present (12/01/2021)   [place]    Feeling of Stress : Not at all  Social Connections: Unknown (12/01/2021)   Social Connection and Isolation Panel [NHANES]    Frequency of Communication with Friends and Family: Not on file    Frequency of Social Gatherings with Friends and Family: Not on file    Attends Religious Services: Not on file    Active Member of Clubs or Organizations: Not on file    Attends Archivist Meetings: Not on file    Marital Status: Married    Tobacco Counseling Counseling given: Not Answered   Clinical Intake:  Pre-visit preparation completed: Yes        Diabetes:  (Followed by PCP)  How often do you need to have someone help you when you read instructions, pamphlets, or other written materials from your doctor or pharmacy?: 1 -  Never  Interpreter Needed?: No    Activities of Daily Living    12/01/2021   10:08 AM  In your present state of health, do you have any difficulty performing the following activities:  Hearing? 0  Vision? 0  Difficulty concentrating or making decisions? 0  Walking or climbing stairs? 0  Dressing or bathing? 0  Doing errands, shopping? 0  Preparing Food and eating ? N  Using the Toilet? N  In the past six months, have you accidently leaked urine? N  Comment Followed by Urology  Do you have problems with  loss of bowel control? N  Managing your Medications? N  Managing your Finances? N  Housekeeping or managing your Housekeeping? N    Patient Care Team: McLean-Scocuzza, Nino Glow, MD as PCP - General (Internal Medicine) Minus Breeding, MD as PCP - Cardiology (Cardiology) Minus Breeding, MD as Consulting Physician (Cardiology) Royston Cowper, MD as Consulting Physician (Urology) Ladene Artist, MD as Consulting Physician (Gastroenterology)  Indicate any recent Medical Services you may have received from other than [facility] providers in the past year (date may be approximate).     Assessment:   This is a routine wellness examination for [place].  Virtual Visit via Telephone Note  I connected with  Brien Mates on 12/01/21 at 10:00 AM EDT by telephone and verified that I am speaking with the correct person using two identifiers.  Persons participating in the virtual visit: patient/Nurse Health Advisor   I discussed the limitations of performing an evaluation and management service by telehealth. We continued and completed visit with audio only. Some vital signs may be absent or patient reported.   Hearing/Vision screen Hearing Screening - Comments:: Patient is able to hear conversational tones without difficulty. No issues reported.  Vision Screening - Comments:: Followed by Dr. Gloriann Loan Wears reader lenses They have seen their ophthalmologist.  Dietary issues and exercise activities discussed: Current Exercise Habits: Home exercise routine, Type of exercise: walking, Intensity: Moderate   Goals Addressed               This Visit's Progress     Patient Stated     Follow up with Primary Care Provider (pt-stated)        As needed       Depression Screen    12/01/2021   10:08 AM 10/31/2021    1:15 PM 11/27/2020    3:54 PM 11/22/2019    1:43 PM 09/22/2019    1:45 PM 11/15/2018   10:42 AM 12/23/2017    4:13 PM  PHQ 2/9 Scores  PHQ - 2 Score 0 0 0 0 0 0 0    Fall Risk     12/01/2021   10:08 AM 10/31/2021    1:15 PM 12/25/2020    1:35 PM 11/27/2020    3:57 PM 06/26/2020    1:30 PM  Fall Risk   Falls in the past year? 0 0 0 0 0  Number falls in past yr:  0 0 0 0  Injury with Fall?  0 0 0 0  Risk for fall due to :  No Fall Risks No Fall Risks    Follow up Falls evaluation completed Falls evaluation completed Falls evaluation completed Falls evaluation completed Falls evaluation completed    [place]: Home free of loose throw rugs in walkways, pet beds, electrical cords, etc? Yes  Adequate lighting in your home to reduce risk of falls? Yes  ASSISTIVE DEVICES UTILIZED TO PREVENT FALLS: Life alert? No  Use of a cane, walker or w/c? No   TIMED UP AND GO: Was the test performed? No .   Cognitive Function: Patient is alert and oriented x3.      11/11/2017   10:21 AM  MMSE - Mini Mental State Exam  Orientation to time 5  Orientation to Place 5  Registration 3  Attention/ Calculation 5  Recall 2  Language- name 2 objects 2  Language- repeat 1  Language- follow 3 step command 3  Language- read & follow direction 1  Write a sentence 1  Copy design 1  Total score 29        11/15/2018   10:42 AM  6CIT Screen  What Year? 0 points  What month? 0 points  What time? 0 points  Count back from 20 0 points  Months in reverse 0 points  Repeat phrase 0 points  Total Score 0 points    Immunizations Immunization History  Administered Date(s) Administered   Fluad Quad(high Dose 65+) 03/26/2020   Influenza, High Dose Seasonal PF 01/25/2015, 03/30/2016, 04/01/2017, 03/29/2018   Influenza,inj,Quad PF,6+ Mos 04/25/2014   Influenza,inj,quad, With Preservative 02/15/2018   Influenza-Unspecified 03/19/2019   Pneumococcal Conjugate-13 09/27/2015   Pneumococcal Polysaccharide-23 04/25/2014   Td 08/07/2008   Tdap 06/26/2020   Zoster, Live 03/21/2014   Screening Tests Health Maintenance  Topic Date Due   OPHTHALMOLOGY  EXAM  12/14/2021   INFLUENZA VACCINE  12/16/2021   HEMOGLOBIN A1C  05/13/2022   COLONOSCOPY (Pts 45-73yr Insurance coverage will need to be confirmed)  06/07/2023   TETANUS/TDAP  06/26/2030   Pneumonia Vaccine 76 Years old  Completed   Hepatitis C Screening  Completed   HPV VACCINES  Aged Out   FOOT EXAM  Discontinued   COVID-19 Vaccine  Discontinued   Zoster Vaccines- Shingrix  Discontinued   Health Maintenance There are no preventive care reminders to display for this patient.  Lung Cancer Screening: (Low Dose CT Chest recommended if Age 74-80years, 30 pack-year currently smoking OR have quit w/in 15years.) does not qualify.   Vision Screening: Recommended annual ophthalmology exams for early detection of glaucoma and other disorders of the eye.  Dental Screening: Recommended annual dental exams for proper oral hygiene  Community Resource Referral / Chronic Care Management: CRR required this visit?  No   CCM required this visit?  No      Plan:   Keep all routine maintenance appointments.   I have personally reviewed and noted the following in the patient's chart:   Medical and social history Use of alcohol, tobacco or illicit drugs  Current medications and supplements including opioid prescriptions. Patient is not currently taking opioid prescriptions. Functional ability and status Nutritional status Physical activity Advanced directives List of other physicians Hospitalizations, surgeries, and ER visits in previous 12 months Vitals Screenings to include cognitive, depression, and falls Referrals and appointments  In addition, I have reviewed and discussed with patient certain preventive protocols, quality metrics, and best practice recommendations. A written personalized care plan for preventive services as well as general preventive health recommendations were provided to patient.     OVarney Biles LPN   78/93/8101

## 2021-12-22 DIAGNOSIS — H25813 Combined forms of age-related cataract, bilateral: Secondary | ICD-10-CM | POA: Diagnosis not present

## 2022-01-26 DIAGNOSIS — Z79899 Other long term (current) drug therapy: Secondary | ICD-10-CM | POA: Diagnosis not present

## 2022-01-26 DIAGNOSIS — Z125 Encounter for screening for malignant neoplasm of prostate: Secondary | ICD-10-CM | POA: Diagnosis not present

## 2022-01-26 DIAGNOSIS — E291 Testicular hypofunction: Secondary | ICD-10-CM | POA: Diagnosis not present

## 2022-01-26 DIAGNOSIS — N401 Enlarged prostate with lower urinary tract symptoms: Secondary | ICD-10-CM | POA: Diagnosis not present

## 2022-05-03 NOTE — Progress Notes (Unsigned)
   SUBJECTIVE:  No chief complaint on file.  HPI Patient presents to clinic to transfer care  No acute concerns today  Hypertension  Diabetes type 2  PERTINENT PMH / PSH: DM type II Hypertension BPH CAD TIA CABG   OBJECTIVE:  There were no vitals taken for this visit.   Physical Exam  ASSESSMENT/PLAN:  There are no diagnoses linked to this encounter. PDMP reviewed***  No follow-ups on file.  [name], MD

## 2022-05-04 ENCOUNTER — Ambulatory Visit (INDEPENDENT_AMBULATORY_CARE_PROVIDER_SITE_OTHER): Payer: Medicare Other | Admitting: Family Medicine

## 2022-05-04 ENCOUNTER — Encounter: Payer: Self-pay | Admitting: Family Medicine

## 2022-05-04 VITALS — BP 122/76 | HR 67 | Temp 97.6°F | Ht 68.0 in | Wt 208.2 lb

## 2022-05-04 DIAGNOSIS — E559 Vitamin D deficiency, unspecified: Secondary | ICD-10-CM

## 2022-05-04 DIAGNOSIS — N401 Enlarged prostate with lower urinary tract symptoms: Secondary | ICD-10-CM

## 2022-05-04 DIAGNOSIS — I700 Atherosclerosis of aorta: Secondary | ICD-10-CM | POA: Diagnosis not present

## 2022-05-04 DIAGNOSIS — I2510 Atherosclerotic heart disease of native coronary artery without angina pectoris: Secondary | ICD-10-CM

## 2022-05-04 DIAGNOSIS — E1159 Type 2 diabetes mellitus with other circulatory complications: Secondary | ICD-10-CM | POA: Diagnosis not present

## 2022-05-04 DIAGNOSIS — K219 Gastro-esophageal reflux disease without esophagitis: Secondary | ICD-10-CM

## 2022-05-04 DIAGNOSIS — F1029 Alcohol dependence with unspecified alcohol-induced disorder: Secondary | ICD-10-CM

## 2022-05-04 DIAGNOSIS — R351 Nocturia: Secondary | ICD-10-CM

## 2022-05-04 DIAGNOSIS — I152 Hypertension secondary to endocrine disorders: Secondary | ICD-10-CM

## 2022-05-04 DIAGNOSIS — F1020 Alcohol dependence, uncomplicated: Secondary | ICD-10-CM | POA: Insufficient documentation

## 2022-05-04 DIAGNOSIS — E119 Type 2 diabetes mellitus without complications: Secondary | ICD-10-CM

## 2022-05-04 NOTE — Assessment & Plan Note (Signed)
Per JNC 8 guideline BP at goal less than 150/90 for age. Continue amlodipine 5 mg daily Continue Altace 10 mg daily Follows with cardiology.

## 2022-05-04 NOTE — Assessment & Plan Note (Signed)
Goal LDL less than 50.  Currently on statin.  No myalgias. Continue Crestor 40 mg daily

## 2022-05-04 NOTE — Assessment & Plan Note (Signed)
Chronic.  Has cut down EtOH use.  Has been 10 drinks a week. Continue to limit EtOH intake.

## 2022-05-04 NOTE — Assessment & Plan Note (Addendum)
EGD completed 2020.  Small hiatal hernia. Controlled with PPI Continue Protonix 40 mg daily.

## 2022-05-04 NOTE — Assessment & Plan Note (Signed)
On Flomax 0.4 mg daily. Follows with urology.

## 2022-05-04 NOTE — Assessment & Plan Note (Signed)
Doing well.  Asymptomatic.   Continue ASA 81 mg daily and Plavix 75 mg daily Continue Imdur 20 mg daily Follows with cardiology

## 2022-06-09 ENCOUNTER — Other Ambulatory Visit: Payer: Self-pay | Admitting: Family

## 2022-06-09 DIAGNOSIS — I10 Essential (primary) hypertension: Secondary | ICD-10-CM

## 2022-07-06 ENCOUNTER — Other Ambulatory Visit: Payer: Self-pay

## 2022-07-06 DIAGNOSIS — E1159 Type 2 diabetes mellitus with other circulatory complications: Secondary | ICD-10-CM

## 2022-07-06 MED ORDER — METOPROLOL SUCCINATE ER 25 MG PO TB24: 25.0000 mg | ORAL_TABLET | Freq: Every day | ORAL | 3 refills | Status: DC

## 2022-07-23 ENCOUNTER — Ambulatory Visit: Payer: Medicare Other | Admitting: Urology

## 2022-07-23 VITALS — BP 117/68 | HR 58 | Ht 68.5 in | Wt 207.4 lb

## 2022-07-23 DIAGNOSIS — E291 Testicular hypofunction: Secondary | ICD-10-CM | POA: Diagnosis not present

## 2022-07-23 DIAGNOSIS — N401 Enlarged prostate with lower urinary tract symptoms: Secondary | ICD-10-CM

## 2022-07-23 DIAGNOSIS — N138 Other obstructive and reflux uropathy: Secondary | ICD-10-CM | POA: Diagnosis not present

## 2022-07-23 DIAGNOSIS — N529 Male erectile dysfunction, unspecified: Secondary | ICD-10-CM | POA: Diagnosis not present

## 2022-07-23 DIAGNOSIS — N400 Enlarged prostate without lower urinary tract symptoms: Secondary | ICD-10-CM | POA: Diagnosis not present

## 2022-07-23 LAB — URINALYSIS, COMPLETE
Bilirubin, UA: NEGATIVE
Glucose, UA: NEGATIVE
Ketones, UA: NEGATIVE
Leukocytes,UA: NEGATIVE
Nitrite, UA: NEGATIVE
Protein,UA: NEGATIVE
RBC, UA: NEGATIVE
Specific Gravity, UA: 1.02 (ref 1.005–1.030)
Urobilinogen, Ur: 0.2 mg/dL (ref 0.2–1.0)
pH, UA: 6.5 (ref 5.0–7.5)

## 2022-07-23 LAB — MICROSCOPIC EXAMINATION

## 2022-07-23 LAB — BLADDER SCAN AMB NON-IMAGING: Scan Result: 0

## 2022-07-23 NOTE — Progress Notes (Signed)
Kevin Webb,acting as a scribe for Kevin Espy, MD.,have documented all relevant documentation on the behalf of Kevin Espy, MD,as directed by  Kevin Espy, MD while in the presence of Kevin Espy, MD.  07/23/2022 10:31 AM   Kevin Webb 15-Apr-1946 [id number]  Referring provider: [name], MD [place],  [place] [address]  Chief Complaint  Patient presents with   Benign Prostatic Hypertrophy    HPI: 77 year-old mae who is transferring care from Dr. Yves Webb and is here to establish care.   He has a personal history of BPH and hypogonadism. He is on Dutasteride 0.5 mg as well as Flomax for his BPH symptoms. He also has a personal history of erectile dysfunction and chronic left UPJ obstruction. He has an IEFF of 4. In Dr. Letta Webb last note, it was stated that he is not currently on testosterone therapy but has been historically prescribed testosterone cream 20% 1 cc daily. In terms of this UPJ obstruction, it is chronic and mild and he has elected for conservative management. His most recent creatinine in 10/2021 was 0.85. His most recent CT scan in 07/2021 which was reviewed today and shows known chronic left UPJ obstruction with moderate hydronephrosis. It appears that he has preservation of his parenchyma.   His most recent PSA on 01/26/2022 was 0.6.  Today, he reports urinary frequency. He notes occasional nocturia. He states that he has briefly researched other treatment options for his BPH and is open to further discussions. He recalls having a kidney stone approximately 2 years ago and had imaging performed and was prescribed medication. He reports that he does not apply the testosterone cream as directed but notes that it did not improve his libido. He was also prescribed pellets in the past that showed improvement the first time it was administered but subsequent treatments were ineffective.   He takes nitroglycerin as he had a CABG when he was 77  years-old. He notes that he has 3 arteries that are 100% blocked and a history of 4 bypasses. He is not a candidate at this time for another bypass. His cardiologist is Dr. Kipp Webb in [facility].   He is currently taking antibiotics for a recent case of diverticulitis.   He reports that he has several beers and a shot of whiskey a day.   Results for orders placed or performed in visit on 07/23/22  Bladder Scan (Post Void Residual) in office  Result Value Ref Range   Scan Result 0 ml      IPSS     Row Name 07/23/22 0900         International Prostate Symptom Score   How often have you had the sensation of not emptying your bladder? Not at All     How often have you had to urinate less than every two hours? More than half the time     How often have you found you stopped and started again several times when you urinated? Not at All     How often have you found it difficult to postpone urination? More than half the time     How often have you had a weak urinary stream? Not at All     How often have you had to strain to start urination? Not at All     How many times did you typically get up at night to urinate? 3 Times     Total IPSS Score 11  Quality of Life due to urinary symptoms   If you were to spend the rest of your life with your urinary condition just the way it is now how would you feel about that? Mostly Satisfied              Score:  1-7 Mild 8-19 Moderate 20-35 Severe   PMH: Past Medical History:  Diagnosis Date   Acute cholecystitis 04/18/2013   Adenomatous colon polyp 12/2004   Adenomatous colon polyp 12/16/2004   Anemia    Anemia    Blood transfusion without reported diagnosis    BPH associated with nocturia    CAD (coronary artery disease)    a. s/p CABG x4 in 2000 with LIMA-LAD, reverse SVG-IM, reverse SVG-acute margin and reverse SVG-RCA b. s/p BMS to SVG-RI in 2012 c. 12/2016: PCI/DES to SVG-RCA and PCI/DES to SVG-RI (Dr. Percival Webb)     Carotid artery stenosis    mild    Cataract    Cervical radiculopathy 03/26/2020   Clotting disorder ([place])    COLONIC POLYPS, HX OF 09/30/2006   Qualifier: Diagnosis of   By: Linna Darner MD, Kevin          Polypectomy 2004, 2009; Dr Fuller Plan         Coronary atherosclerosis of artery bypass graft 09/30/2006   Qualifier: Diagnosis of   By: Linna Darner MD, Kevin       Diabetes mellitus without complication ([place]) [id number]   recent A1C 6.1 12/25/16 controlled with diet and exercise    Diverticulitis 2008/2009   Diverticulitis    Dizziness 04/21/2020   Educated about COVID-19 virus infection 10/06/2018   GERD 02/22/2007   Qualifier: Diagnosis of   By: Linna Darner MD, Gwyndolyn Saxon       GERD (gastroesophageal reflux disease)    controlled as of 04/01/17    HSV infection    HTN (hypertension)    HTN (hypertension) 09/30/2006   Qualifier: Diagnosis of   By: Linna Darner MD, Kevin       Hx of CABG 12/25/2020   Hx of dysplastic nevus 05/04/2018   upper back spinal    Hyperglycemia    Hyperlipidemia    Hyperlipidemia LDL goal <70 01/08/2017   Impingement syndrome of left shoulder 2016   Impingement syndrome of left shoulder 05/18/2014   Kidney cysts 06/20/2019   Low back pain 02/15/2019   Low testosterone    Dr Kevin Webb, [facility] ,[place]   Myocardial infarction [facility])    NSTEMI (non-ST elevated myocardial infarction) ([place]) 12/25/2016   Osteoarthritis of right knee    With trace effusion   Peyronie disease    Piriformis syndrome of right side 09/22/2019   Prediabetes 03/29/2018   Prostatitis 12/23/2017   Right arm weakness 02/02/2020   RLS (restless legs syndrome) 08/15/2013   RLS (restless legs syndrome) 08/15/2013   Rotator cuff injury    right; at 27-55 years old.    Sinus bradycardia    Sinus bradycardia    Thrombocytopenia ([facility])    TIA (transient ischemic attack) 01/19/2020   Tubular adenoma of colon 06/26/2020   Type 2 diabetes mellitus without complication, without long-term current use of  insulin ([place]) 09/28/2016   Unstable angina ([place]) 12/01/2019   Vitamin D deficiency     Surgical History: Past Surgical History:  Procedure Laterality Date   CHOLECYSTECTOMY N/A 04/21/2013   Procedure: LAPAROSCOPIC CHOLECYSTECTOMY WITH INTRAOPERATIVE CHOLANGIOGRAM;  Surgeon: Haywood Lasso, MD;  Location: [facility];  Service: General;  Laterality: N/A;   CHOLECYSTECTOMY  COLONOSCOPY W/ POLYPECTOMY  2004 & 2009    X 2; Dr Fuller Plan; due 2014   CORONARY ANGIOPLASTY     CORONARY ARTERY BYPASS GRAFT  04/1999   4 vessels   CORONARY STENT INTERVENTION N/A 12/28/2016   Procedure: CORONARY STENT INTERVENTION;  Surgeon: Lorretta Harp, MD;  Location: [facility];  Service: Cardiovascular;  Laterality: N/A;   CORONARY STENT PLACEMENT  2012   Plavix   LAPAROSCOPIC CHOLECYSTECTOMY  04/21/2013   Dr Margot Chimes; acutecholecystitis with necrosis   LEFT HEART CATH AND CORS/GRAFTS ANGIOGRAPHY N/A 12/28/2016   Procedure: LEFT HEART CATH AND CORS/GRAFTS ANGIOGRAPHY;  Surgeon: Lorretta Harp, MD;  Location: [facility];  Service: Cardiovascular;  Laterality: N/A;   LEFT HEART CATH AND CORS/GRAFTS ANGIOGRAPHY N/A 12/01/2019   Procedure: LEFT HEART CATH AND CORS/GRAFTS ANGIOGRAPHY;  Surgeon: Martinique, Peter M, MD;  Location: [facility];  Service: Cardiovascular;  Laterality: N/A;   VASECTOMY      Home Medications:  Allergies as of 07/23/2022       Reactions   Darvocet [propoxyphene N-acetaminophen] Hives   Propoxyphene Hives        Medication List        Accurate as of July 23, 2022 10:31 AM. If you have any questions, ask your nurse or doctor.          amLODipine 5 MG tablet Commonly known as: NORVASC TAKE 1 TABLET BY MOUTH DAILY   aspirin EC 81 MG tablet Take 81 mg by mouth daily at 2 am. Swallow whole.   cholecalciferol 25 MCG (1000 UT) tablet Generic drug: Cholecalciferol Take 1,000 Units by mouth daily at 2 am.   clopidogrel 75 MG tablet Commonly known as:  PLAVIX TAKE 1 TABLET BY MOUTH DAILY   CO Q-10 PO Take 1 capsule by mouth daily at 2 am.   dicyclomine 10 MG capsule Commonly known as: BENTYL Take 1 capsule (10 mg total) by mouth 3 (three) times daily with meals as needed for spasms.   ezetimibe 10 MG tablet Commonly known as: ZETIA TAKE 1 TABLET BY MOUTH DAILY   Fish Oil 1000 MG Caps Take 2,000 mg by mouth daily at 2 am.   GINKGO BILOBA PO Take 1 capsule by mouth daily at 2 am.   isosorbide mononitrate 120 MG 24 hr tablet Commonly known as: IMDUR Take 1 tablet (120 mg total) by mouth daily.   MAGNESIUM PO Take 1 tablet by mouth daily at 2 am.   metoprolol succinate 25 MG 24 hr tablet Commonly known as: TOPROL-XL Take 1 tablet (25 mg total) by mouth daily.   multivitamin with minerals Tabs tablet Take 1 tablet by mouth daily at 2 am.   niacin 250 MG tablet Commonly known as: VITAMIN B3 Take 250 mg by mouth daily.   nitroGLYCERIN 0.4 MG SL tablet Commonly known as: NITROSTAT Place 1 tablet (0.4 mg total) under the tongue every 5 (five) minutes x 3 doses as needed.   pantoprazole 40 MG tablet Commonly known as: PROTONIX Take 1 tablet (40 mg total) by mouth daily at 2 am. 30 minutes.   ramipril 10 MG capsule Commonly known as: ALTACE TAKE 1 CAPSULE BY MOUTH ONCE DAILY   rosuvastatin 40 MG tablet Commonly known as: CRESTOR TAKE 1 TABLET AT BEDTIME   tamsulosin 0.4 MG Caps capsule Commonly known as: FLOMAX Take 0.4 mg by mouth daily at 2 am.   vitamin C 1000 MG tablet Take 1,000 mg by  mouth daily at 2 am. Reported on 09/26/2015   vitamin E 45 MG (100 UNITS) capsule Take 100 Units by mouth daily at 2 am.        Allergies:  Allergies  Allergen Reactions   Darvocet [Propoxyphene N-Acetaminophen] Hives   Propoxyphene Hives    Family History: Family History  Problem Relation Age of Onset   Coronary artery disease Mother        CABG in 88s   CVA Mother        cns bleed from warfarin   Aneurysm  Mother    Aortic aneurysm Father 68       ? abdominal   Heart attack Father 75   Cholecystitis Sister    Diabetes Brother    Diabetes Other        PGaunt   Cancer Neg Hx    Colon cancer Neg Hx    Prostate cancer Neg Hx    Esophageal cancer Neg Hx    Stomach cancer Neg Hx    Rectal cancer Neg Hx     Social History:  reports that he has never smoked. He has never used smokeless tobacco. He reports current alcohol use of about 10.0 - 12.0 standard drinks of alcohol per week. He reports that he does not use drugs.   Physical Exam: BP 117/68   Pulse (!) 58   Ht 5' 8.5" (1.74 m)   Wt 207 lb 6 oz (94.1 kg)   BMI 31.07 kg/m   Constitutional:  Alert and oriented, No acute distress. HEENT: [place] AT, moist mucus membranes.  Trachea midline, no masses. Neurologic: Grossly intact, no focal deficits, moving all 4 extremities. Psychiatric: Normal mood and affect.  Pertinent imaging: CLINICAL DATA:  Abdominal pain   EXAM: CT ABDOMEN AND PELVIS WITH CONTRAST   TECHNIQUE: Multidetector CT imaging of the abdomen and pelvis was performed using the standard protocol following bolus administration of intravenous contrast.   RADIATION DOSE REDUCTION: This exam was performed according to the departmental dose-optimization program which includes automated exposure control, adjustment of the mA and/or kV according to patient size and/or use of iterative reconstruction technique.   CONTRAST:  131m OMNIPAQUE IOHEXOL 300 MG/ML  SOLN   COMPARISON:  CT abdomen and pelvis 05/09/2019   FINDINGS: Lower chest: No acute abnormality.   Hepatobiliary: Liver is normal in size and contour with no suspicious mass identified. A few tiny subcentimeter hypodensities scattered throughout the liver which are too small to characterize, most likely cysts or hemangiomas. Gallbladder is surgically absent. No biliary ductal dilatation identified.   Pancreas: Unremarkable. No pancreatic ductal dilatation  or surrounding inflammatory changes.   Spleen: Normal in size without suspicious abnormality.   Adrenals/Urinary Tract: Adrenal glands appear normal. Symmetric perfusion of the kidneys. Multiple renal cortical cysts identified bilaterally measuring up to 2.2 cm at the lower medial right kidney and 2.1 cm at the anterior left kidney. Chronic moderate left hydronephrosis and distended renal pelvis, not significantly changed. The ureters are normal caliber. Urinary bladder appears normal.   Stomach/Bowel: No bowel obstruction, free air or pneumatosis. Colonic diverticulosis without evidence of acute diverticulitis. No evidence of acute appendicitis.   Vascular/Lymphatic: Severe atherosclerotic disease. No bulky lymphadenopathy identified.   Reproductive: Prostate is unremarkable.   Other: No ascites.   Musculoskeletal: No suspicious bony lesions identified.   IMPRESSION: 1. No acute process identified. 2. Colonic diverticulosis. 3. Stable chronic moderate left hydronephrosis without hydroureter, possibly secondary to UPJ stenosis. 4. Other chronic findings as  described.     Electronically Signed   By: Ofilia Neas M.D.   On: 07/17/2021 14:25  This was personally reviewed and I agree with the radiologic interpretation.   Assessment & Plan:    1. BPH with lower urinary tract symptoms - Managed on dutasteride and Flomax chronically.  - Currently has urinary frequency symptoms but possibly related to fluid intake especially overnight  2. Chronic UPJ obstruction - No pain or atrophy. - Managed conservatively with normal renal function.  3. Hypogonadism -Currently not using on a regular basis, query whether this medication has really helped him in the past whether or not he is in fact a good candidate for strenuous sexual activity  -He reports that he is not particularly worried about his sexual function but wonders if his younger wife is concerned, he has not really  had a conversation with her and have urged him to continue this discussion -Also reach out to Dr. Rosezella [place] office to see if he has any concerns about Mr. Joellyn Quails engaging in sexual activity  4. Erectile dysfunction - We discussed the pathophysiology of erectile dysfunction today along with possible contributing factors. Discussed possible treatment options including PDE 5 inhibitors, vacuum erectile device, intracavernosal injection, MUSE, and placement of the inflatable or malleable penile prosthesis for refractory cases. -PDE5i inhibitors are contraindicated in the setting of nitrites -Could consider intracavernosal injections if the patient is motivated enough pending discussion with cardiology   Return in about 6 months (around 01/23/2023) for reevaluation.\  I have reviewed the above documentation for accuracy and completeness, and I agree with the above.   Kevin Espy, MD   [facility] [address], [place], [place] [address] [phone]

## 2022-08-05 ENCOUNTER — Telehealth: Payer: Self-pay | Admitting: Cardiology

## 2022-08-05 ENCOUNTER — Emergency Department (HOSPITAL_COMMUNITY)
Admission: EM | Admit: 2022-08-05 | Discharge: 2022-08-05 | Disposition: A | Discharge status: Home / Self Care | Payer: Medicare Other | Attending: Emergency Medicine | Admitting: Emergency Medicine

## 2022-08-05 ENCOUNTER — Emergency Department (HOSPITAL_COMMUNITY): Payer: Medicare Other

## 2022-08-05 ENCOUNTER — Other Ambulatory Visit: Payer: Self-pay

## 2022-08-05 DIAGNOSIS — R5383 Other fatigue: Secondary | ICD-10-CM | POA: Insufficient documentation

## 2022-08-05 DIAGNOSIS — I10 Essential (primary) hypertension: Secondary | ICD-10-CM | POA: Diagnosis not present

## 2022-08-05 DIAGNOSIS — Z7982 Long term (current) use of aspirin: Secondary | ICD-10-CM | POA: Diagnosis not present

## 2022-08-05 DIAGNOSIS — R0789 Other chest pain: Secondary | ICD-10-CM | POA: Diagnosis not present

## 2022-08-05 DIAGNOSIS — I25118 Atherosclerotic heart disease of native coronary artery with other forms of angina pectoris: Secondary | ICD-10-CM | POA: Diagnosis not present

## 2022-08-05 DIAGNOSIS — E119 Type 2 diabetes mellitus without complications: Secondary | ICD-10-CM | POA: Insufficient documentation

## 2022-08-05 DIAGNOSIS — I2510 Atherosclerotic heart disease of native coronary artery without angina pectoris: Secondary | ICD-10-CM | POA: Diagnosis not present

## 2022-08-05 DIAGNOSIS — I209 Angina pectoris, unspecified: Secondary | ICD-10-CM | POA: Insufficient documentation

## 2022-08-05 DIAGNOSIS — R079 Chest pain, unspecified: Secondary | ICD-10-CM | POA: Diagnosis not present

## 2022-08-05 DIAGNOSIS — Z79899 Other long term (current) drug therapy: Secondary | ICD-10-CM | POA: Diagnosis not present

## 2022-08-05 LAB — BASIC METABOLIC PANEL
Anion gap: 15 (ref 5–15)
BUN: 5 mg/dL — ABNORMAL LOW (ref 8–23)
CO2: 21 mmol/L — ABNORMAL LOW (ref 22–32)
Calcium: 9.2 mg/dL (ref 8.9–10.3)
Chloride: 99 mmol/L (ref 98–111)
Creatinine, Ser: 0.9 mg/dL (ref 0.61–1.24)
GFR, Estimated: >60 mL/min (ref >60)
Glucose, Bld: 116 mg/dL — ABNORMAL HIGH (ref 70–99)
Potassium: 4 mmol/L (ref 3.5–5.1)
Sodium: 135 mmol/L (ref 135–145)

## 2022-08-05 LAB — CBC
HCT: 42.9 % (ref 39.0–52.0)
Hemoglobin: 14.7 g/dL (ref 13.0–17.0)
MCH: 30.9 pg (ref 26.0–34.0)
MCHC: 34.3 g/dL (ref 30.0–36.0)
MCV: 90.1 fL (ref 80.0–100.0)
Platelets: 162 10*3/uL (ref 150–400)
RBC: 4.76 MIL/uL (ref 4.22–5.81)
RDW: 13.3 % (ref 11.5–15.5)
WBC: 6.7 10*3/uL (ref 4.0–10.5)
nRBC: 0 % (ref 0.0–0.2)

## 2022-08-05 LAB — TROPONIN I (HIGH SENSITIVITY)
Troponin I (High Sensitivity): 4 ng/L (ref <18)
Troponin I (High Sensitivity): 4 ng/L (ref <18)

## 2022-08-05 NOTE — ED Provider Triage Note (Signed)
Emergency Medicine Provider Triage Evaluation Note  Kevin Webb , a 77 y.o. male  was evaluated in triage.  Pt complains of intermittent tightness in his left arm, left chest, and left side of his jaw that began 1 week ago.  He reports that this has been unchanged since onset and is not worse with exertion.  Denies dizziness, lightheadedness, shortness of breath, nausea.  He does have history significant for NSTEMI, hypertension, CABG, hyperlipidemia, CAD.  He also has associated increased fatigue.    Review of Systems  Positive: As above Negative: As above  Physical Exam  BP (!) 161/94 (BP Location: Right Arm)   Pulse 75   Temp 97.9 F (36.6 C) (Oral)   Resp 17   SpO2 97%  Gen:   Awake, no distress   Resp:  Normal effort, no acute respiratory distress or dyspnea  MSK:   Moves extremities without difficulty  Other:    Medical Decision Making  Medically screening exam initiated at 2:40 PM.  Appropriate orders placed.  Kevin Webb was informed that the remainder of the evaluation will be completed by another provider, this initial triage assessment does not replace that evaluation, and the importance of remaining in the ED until their evaluation is complete.     Theressa Stamps R, [place] 08/05/22 1446

## 2022-08-05 NOTE — Discharge Instructions (Addendum)
You were seen for your chest pain in the emergency department.   At home, please take your nitroglycerin as prescribed.    Follow-up with your primary doctor in 2-3 days regarding your visit.  Please follow-up with cardiology on Friday as you have scheduled.  Return immediately to the emergency department if you experience any of the following: Worsening pain, difficulty breathing, unexplained vomiting or sweating, or any other concerning symptoms.    Thank you for visiting our Emergency Department. It was a pleasure taking care of you today.

## 2022-08-05 NOTE — Consult Note (Signed)
Cardiology Consultation   Patient ID: Kevin Webb MRN: [id number]; DOB: Dec 25, 1945  Admit date: 08/05/2022 Date of Consult: 08/05/2022  PCP:  Kevin Webb, [facility] Cardiologist:  Kevin Breeding, MD   Patient Profile:   Kevin Webb is a 77 y.o. male with a hx of CAD s/p CABG with subsequent PCI, carotid artery stenosis, DM, hypertension, hyperlipidemia, and BPH who is being seen 08/05/2022 for the evaluation of chest pain at the request of Dr. Philip Webb.  History of Present Illness:   Kevin Webb is status post CABG x 4 in 2000 with LIMA-LAD, SVG-IM, SVG-acute marginal, SVG-RCA with subsequent PCI including BMS to SVG-RI in 2012, DES-SVG-RCA and DES-SVG-RI in 2018.  His last heart catheterization was July 2021.  Heart catheterization 7/60/21 showed patent LIMA-LAD with occluded SVG grafts to both the PDA and ramus..  No PCI given good collateral flow to the distal RCA and very faint collaterals to the ramus intermediate.  He was deemed a poor candidate for CTO PCI of the RCA or ramus intermediate.  He was also felt not suitable for redo CABG.  Medical therapy was increased by adding Imdur 30 mg daily.  He was last seen in clinic by Kevin Webb on 11/28/2021 and doing well at that time.  He called our office today reporting chest pain.  He was advised to be evaluated in the ER.  He reports that chest pain started 9 days ago.  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  Reports pain is intermittent, will last up to 30 minutes.  States that if he repositions himself pain will resolve.  Has not noted worsening pain with exertion.  States that often pain will resolve if he goes for a walk.  On arrival in the ER, vital signs notable for BP 161/94, pulse 75, SpO2 97% on room air.  Labs notable for creatinine 0.9, troponin 4 > 4, hemoglobin 14.7, platelets 162, WBC 6.7.   EKG shows normal sinus rhythm, right bundle branch block, rate 68.  Chest x-ray unremarkable.    Past Medical History:  Diagnosis Date   Acute cholecystitis 04/18/2013   Adenomatous colon polyp 12/2004   Adenomatous colon polyp 12/16/2004   Anemia    Anemia    Blood transfusion without reported diagnosis    BPH associated with nocturia    CAD (coronary artery disease)    a. s/p CABG x4 in 2000 with LIMA-LAD, reverse SVG-IM, reverse SVG-acute margin and reverse SVG-RCA b. s/p BMS to SVG-RI in 2012 c. 12/2016: PCI/DES to SVG-RCA and PCI/DES to SVG-RI (Kevin Webb)    Carotid artery stenosis    mild    Cataract    Cervical radiculopathy 03/26/2020   Clotting disorder (Kevin Webb)    COLONIC POLYPS, HX OF 09/30/2006   Qualifier: Diagnosis of   By: Kevin Darner MD, Kevin Webb          Polypectomy 2004, 2009; Dr Kevin Webb         Coronary atherosclerosis of artery bypass graft 09/30/2006   Qualifier: Diagnosis of   By: Kevin Darner MD, Kevin Webb       Diabetes mellitus without complication ([place]) [id number]   recent A1C 6.1 12/25/16 controlled with diet and exercise    Diverticulitis 2008/2009   Diverticulitis    Dizziness 04/21/2020   Educated about COVID-19 virus infection 10/06/2018   GERD  02/22/2007   Qualifier: Diagnosis of   By: Kevin Darner MD, Gwyndolyn Saxon       GERD (gastroesophageal reflux disease)    controlled as of 04/01/17    HSV infection    HTN (hypertension)    HTN (hypertension) 09/30/2006   Qualifier: Diagnosis of   By: Kevin Darner MD, Kevin Webb       Hx of CABG 12/25/2020   Hx of dysplastic nevus 05/04/2018   upper back spinal    Hyperglycemia    Hyperlipidemia    Hyperlipidemia LDL goal <70 01/08/2017   Impingement syndrome of left shoulder 2016   Impingement syndrome of left shoulder 05/18/2014   Kidney cysts 06/20/2019   Low back pain 02/15/2019   Low testosterone    Dr Kevin Webb, [facility]   Myocardial infarction [facility])    NSTEMI (non-ST elevated myocardial infarction) ([place]) 12/25/2016    Osteoarthritis of right knee    With trace effusion   Peyronie disease    Piriformis syndrome of right side 09/22/2019   Prediabetes 03/29/2018   Prostatitis 12/23/2017   Right arm weakness 02/02/2020   RLS (restless legs syndrome) 08/15/2013   RLS (restless legs syndrome) 08/15/2013   Rotator cuff injury    right; at 8-90 years old.    Sinus bradycardia    Sinus bradycardia    Thrombocytopenia ([facility])    TIA (transient ischemic attack) 01/19/2020   Tubular adenoma of colon 06/26/2020   Type 2 diabetes mellitus without complication, without long-term current use of insulin ([place]) 09/28/2016   Unstable angina ([place]) 12/01/2019   Vitamin D deficiency     Past Surgical History:  Procedure Laterality Date   CHOLECYSTECTOMY N/A 04/21/2013   Procedure: LAPAROSCOPIC CHOLECYSTECTOMY WITH INTRAOPERATIVE CHOLANGIOGRAM;  Surgeon: Kevin Lasso, MD;  Location: [place];  Service: General;  Laterality: N/A;   CHOLECYSTECTOMY     COLONOSCOPY W/ POLYPECTOMY  2004 & 2009    X 2; Dr Kevin Webb; due 2014   CORONARY ANGIOPLASTY     CORONARY ARTERY BYPASS GRAFT  04/1999   4 vessels   CORONARY STENT INTERVENTION N/A 12/28/2016   Procedure: CORONARY STENT INTERVENTION;  Surgeon: Kevin Harp, MD;  Location: [place] [facility];  Service: Cardiovascular;  Laterality: N/A;   CORONARY STENT PLACEMENT  2012   Plavix   LAPAROSCOPIC CHOLECYSTECTOMY  04/21/2013   Dr Kevin Webb; acutecholecystitis with necrosis   LEFT HEART CATH AND CORS/GRAFTS ANGIOGRAPHY N/A 12/28/2016   Procedure: LEFT HEART CATH AND CORS/GRAFTS ANGIOGRAPHY;  Surgeon: Kevin Harp, MD;  Location: [place] [facility];  Service: Cardiovascular;  Laterality: N/A;   LEFT HEART CATH AND CORS/GRAFTS ANGIOGRAPHY N/A 12/01/2019   Procedure: LEFT HEART CATH AND CORS/GRAFTS ANGIOGRAPHY;  Surgeon: Webb, Kevin M, MD;  Location: [place] [facility];  Service: Cardiovascular;  Laterality: N/A;   VASECTOMY       Home Medications:  Prior to Admission  medications   Medication Sig Start Date End Date Taking? Authorizing Provider  amLODipine (NORVASC) 5 MG tablet TAKE 1 TABLET BY MOUTH DAILY 06/23/21   Dutch Quint B, FNP  Ascorbic Acid (VITAMIN C) 1000 MG tablet Take 1,000 mg by mouth daily at 2 am. Reported on 09/26/2015    [provider]  aspirin EC 81 MG tablet Take 81 mg by mouth daily at 2 am. Swallow whole.    [provider]  cholecalciferol (VITAMIN D) 25 MCG (1000 UNIT) tablet Take 1,000 Units by mouth daily at 2 am.    [provider]  clopidogrel (PLAVIX) 75 MG tablet TAKE 1 TABLET BY MOUTH DAILY 07/28/21   McLean-Scocuzza, Nino Glow, MD  Coenzyme Q10 (CO Q-10 PO) Take 1 capsule by mouth daily at 2 am.    [provider]  dicyclomine (BENTYL) 10 MG capsule Take 1 capsule (10 mg total) by mouth 3 (three) times daily with meals as needed for spasms. 07/10/21   Ladene Artist, MD  ezetimibe (ZETIA) 10 MG tablet TAKE 1 TABLET BY MOUTH DAILY 07/28/21   McLean-Scocuzza, Nino Glow, MD  GINKGO BILOBA PO Take 1 capsule by mouth daily at 2 am.     [provider]  isosorbide mononitrate (IMDUR) 120 MG 24 hr tablet Take 1 tablet (120 mg total) by mouth daily. 11/03/21   Kevin Breeding, MD  MAGNESIUM PO Take 1 tablet by mouth daily at 2 am.    [provider]  metoprolol succinate (TOPROL-XL) 25 MG 24 hr tablet Take 1 tablet (25 mg total) by mouth daily. 07/06/22 07/01/23  Kevin Leitz, MD  Multiple Vitamin (MULTIVITAMIN WITH MINERALS) TABS tablet Take 1 tablet by mouth daily at 2 am.    [provider]  niacin 250 MG tablet Take 250 mg by mouth daily.    [provider]  nitroGLYCERIN (NITROSTAT) 0.4 MG SL tablet Place 1 tablet (0.4 mg total) under the tongue every 5 (five) minutes x 3 doses as needed. 10/28/21   Kevin Breeding, MD  Omega-3 Fatty Acids (FISH OIL) 1000 MG CAPS Take 2,000 mg by mouth daily at 2 am.     [provider]  pantoprazole (PROTONIX) 40 MG tablet Take  1 tablet (40 mg total) by mouth daily at 2 am. 30 minutes. 10/31/21   McLean-Scocuzza, Nino Glow, MD  ramipril (ALTACE) 10 MG capsule TAKE 1 CAPSULE BY MOUTH ONCE DAILY 06/23/21   Dutch Quint B, FNP  rosuvastatin (CRESTOR) 40 MG tablet TAKE 1 TABLET AT BEDTIME 10/27/21   McLean-Scocuzza, Nino Glow, MD  tamsulosin (FLOMAX) 0.4 MG CAPS capsule Take 0.4 mg by mouth daily at 2 am.     [provider]  vitamin E 100 UNIT capsule Take 100 Units by mouth daily at 2 am.     [provider]    Inpatient Medications: Scheduled Meds:  sodium chloride flush  3 mL Intravenous Q12H   Continuous Infusions:  PRN Meds:   Allergies:    Allergies  Allergen Reactions   Darvocet [Propoxyphene N-Acetaminophen] Hives   Propoxyphene Hives    Social History:   Social History   Socioeconomic History   Marital status: Married    Spouse name: Not on file   Number of children: 1   Years of education: Not on file   Highest education level: Not on file  Occupational History   Occupation:      Comment: plumber  Tobacco Use   Smoking status: Never   Smokeless tobacco: Never  Vaping Use   Vaping Use: Never used  Substance and Sexual Activity   Alcohol use: Yes    Alcohol/week: 10.0 - 12.0 standard drinks of alcohol    Types: 10 - 12 Cans of beer per week    Comment: drinks beer daily; whiskey rarely, used to be daily 1-2 shots   Drug use: No   Sexual activity: Yes    Birth control/protection: None  Other Topics Concern   Not on file  Social History Narrative   Remarried 2014   1 son age 42 as of 03/2017  Lives at home with spouse   Right handed   Caffeine: 1-2 cups/day (coffee, green tea)   Former Engineer, structural     Social Determinants of [place] Strain: [place]  (12/01/2021)   Overall Financial Resource Strain (CARDIA)    Difficulty of Paying Living Expenses: Not hard at all  Food Insecurity: No Food Insecurity (12/01/2021)   Hunger Vital  Sign    Worried About Running Out of Food in the Last Year: Never true    [place] in the Last Year: Never true  Transportation Needs: No Transportation Needs (12/01/2021)   PRAPARE - Hydrologist (Medical): No    Lack of Transportation (Non-Medical): No  Physical Activity: Sufficiently Active (12/01/2021)   Exercise Vital Sign    Days of Exercise per Week: 7 days    Minutes of Exercise per Session: 60 min  Stress: No Stress Concern Present (12/01/2021)   [place]    Feeling of Stress : Not at all  Social Connections: Unknown (12/01/2021)   Social Connection and Isolation Panel [NHANES]    Frequency of Communication with Friends and Family: Not on file    Frequency of Social Gatherings with Friends and Family: Not on file    Attends Religious Services: Not on file    Active Member of Clubs or Organizations: Not on file    Attends Archivist Meetings: Not on file    Marital Status: Married  Intimate Partner Violence: Not At Risk (12/01/2021)   Humiliation, Afraid, Rape, and Kick questionnaire    Fear of Current or Ex-Partner: No    Emotionally Abused: No    Physically Abused: No    Sexually Abused: No    Family History:    Family History  Problem Relation Age of Onset   Coronary artery disease Mother        CABG in 42s   CVA Mother        cns bleed from warfarin   Aneurysm Mother    Aortic aneurysm Father 57       ? abdominal   Heart attack Father 33   Cholecystitis Sister    Diabetes Brother    Diabetes Other        PGaunt   Cancer Neg Hx    Colon cancer Neg Hx    Prostate cancer Neg Hx    Esophageal cancer Neg Hx    Stomach cancer Neg Hx    Rectal cancer Neg Hx      ROS:  Please see the history of present illness.   All other ROS reviewed and negative.     Physical Exam/Data:   Vitals:   08/05/22 1525 08/05/22 1526 08/05/22 1630 08/05/22 1845  BP:  132/72  119/63   Pulse:  62 (!) 55 60  Resp:  19 18 16   Temp:      TempSrc:      SpO2:  98% 95% 98%   No intake or output data in the 24 hours ending 08/05/22 1918    07/23/2022    9:33 AM 05/04/2022   12:58 PM 12/01/2021   10:10 AM  Last 3 Weights  Weight (lbs) 207 lb 6 oz 208 lb 3.2 oz 209 lb  Weight (kg) 94.065 kg 94.439 kg 94.802 kg     There is no height or weight on file to calculate BMI.  General:  in no acute  distress HEENT: normal Neck: no JVD Cardiac:  normal S1, S2; RRR; no murmur  Lungs:  clear to auscultation bilaterally, no wheezing, rhonchi or rales  Abd: soft, nontender, no hepatomegaly  Ext: no edema Musculoskeletal:  No deformities Skin: warm and dry  Neuro:   no focal abnormalities noted Psych:  Normal affect   EKG:  The EKG was personally reviewed and demonstrates:  NSR HR 68, RBBB, left axis deviation Telemetry:  Telemetry was personally reviewed and demonstrates:  NSR  Relevant CV Studies:  Heart cath 12/01/2019: Ost LAD to Prox LAD lesion is 100% stenosed. 1st Diag lesion is 60% stenosed. Ramus lesion is 100% stenosed. Prox Cx lesion is 40% stenosed. Prox RCA lesion is 100% stenosed. Mid RCA lesion is 100% stenosed. Prox Graft lesion is 100% stenosed. Prox Graft lesion is 100% stenosed. Origin to Prox Graft lesion is 100% stenosed. The left ventricular systolic function is normal. LV end diastolic pressure is normal. The left ventricular ejection fraction is 55-65% by visual estimate.   1. Severe 3 vessel occlusive CAD involving the ostial LAD, ostial ramus intermediate and proximal RCA. 2. Occluded SVG to PDA 3. Occluded SVG to ramus intermediate 4. Patent LIMA to the LAD.  5. Normal LV function 6. Normal LVEDP   Webb: would recommend medical management. There are good collaterals to the distal RCA. Very faint collaterals to the ramus intermediate. He is a poor candidate for CTO PCI of the RCA with ambiguous proximal cap and severe diffuse  disease in the mid vessel based on old films- I suspect it is occluded in the mid vessel as well now. The ramus is also not a candidate for CTO PCI and is not a suitable target for redo CABG. Will add Imdur 30 mg daily.   Laboratory Data:  High Sensitivity Troponin:   Recent Labs  Lab 08/05/22 1440 08/05/22 1634  TROPONINIHS 4 4     Chemistry Recent Labs  Lab 08/05/22 1440  NA 135  K 4.0  CL 99  CO2 21*  GLUCOSE 116*  BUN 5*  CREATININE 0.90  CALCIUM 9.2  GFRNONAA >60  ANIONGAP 15    No results for input(s): "PROT", "ALBUMIN", "AST", "ALT", "ALKPHOS", "BILITOT" in the last 168 hours. Lipids No results for input(s): "CHOL", "TRIG", "HDL", "LABVLDL", "LDLCALC", "CHOLHDL" in the last 168 hours.  Hematology Recent Labs  Lab 08/05/22 1440  WBC 6.7  RBC 4.76  HGB 14.7  HCT 42.9  MCV 90.1  MCH 30.9  MCHC 34.3  RDW 13.3  PLT 162   Thyroid No results for input(s): "TSH", "FREET4" in the last 168 hours.  BNPNo results for input(s): "BNP", "PROBNP" in the last 168 hours.  DDimer No results for input(s): "DDIMER" in the last 168 hours.   Radiology/Studies:  DG Chest 2 View  Result Date: 08/05/2022 CLINICAL DATA:  Left-sided chest tightness. EXAM: CHEST - 2 VIEW COMPARISON:  Oct 12, 2020 FINDINGS: Multiple sternal wires and vascular clips are noted. The heart size and mediastinal contours are within normal limits. Both lungs are clear. The visualized skeletal structures are unremarkable. IMPRESSION: 1. Evidence of prior median sternotomy/CABG. 2. No acute cardiopulmonary disease. Electronically Signed   By: Virgina Norfolk Webb.D.   On: 08/05/2022 15:26     Assessment and Webb:   Chest pain: Description suggests noncardiac chest pain, likely musculoskeletal pain.  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  Pain is in right upper chest, reports  will resolve if he repositions himself.  Does not worsen with exertion.  Troponins  negative x 2, EKG with no ischemic changes. -No ischemic work-up recommended at this time   CAD s/p CABG with subsequent PCI: status post CABG x 4 in 2000 with LIMA-LAD, SVG-IM, SVG-acute marginal, SVG-RCA with subsequent PCI including BMS to SVG-RI in 2012, DES-SVG-RCA and DES-SVG-RI in 2018.  His last heart catheterization was July 2021.  Heart catheterization 7/60/21 showed patent LIMA-LAD with occluded SVG grafts to both the PDA and ramus..  No PCI given good collateral flow to the distal RCA and very faint collaterals to the ramus intermediate.  He was deemed a poor candidate for CTO PCI of the RCA or ramus intermediate.  He was also felt not suitable for redo CABG.  Medical therapy was recommended -on 81 mg aspirin, Plavix, 5 mg amlodipine, 120 mg Imdur, 25 mg Toprol, 10 mg ramipril, 40 mg Crestor, and Zetia.  Continue home regimen      For questions or updates, please contact [place] Please consult [email or web address] for contact info under    Signed, Donato Heinz, MD  08/05/2022 7:18 PM

## 2022-08-05 NOTE — ED Provider Notes (Signed)
[facility] Provider Note   CSN: [id number] Arrival date & time: 08/05/22  1427     History  Chief Complaint  Patient presents with   Chest Pain    Kevin Webb is a 77 y.o. male.  The history is provided by the patient and medical records. No language interpreter was used.  Chest Pain    77 year old male with extensive medical history which includes CAD, hyperlipidemia, hypertension, diabetes, GERD, prior MI presenting today with complaints of chest pain.  Patient report for the past 4 to 5 days he has had intermittent chest discomfort.  He describes the discomfort more as like a muscle pull and tightness sensation across his chest sometimes radiates towards his jaw.  He also endorses mild fatigue.  He states repositioning his body some time aggravate his discomfort.  He does not endorse any exertional chest pain no lightheadedness or dizziness no nausea or diaphoresis.  Denies any fever or chills no productive cough.  States pain is very minimal at this time.  No specific treatment tried at home.  He did reach out to his cardiology office but due to his significant cardiac history they recommend patient to come to ER for further assessment.  Home Medications Prior to Admission medications   Medication Sig Start Date End Date Taking? Authorizing Provider  amLODipine (NORVASC) 5 MG tablet TAKE 1 TABLET BY MOUTH DAILY 06/23/21   Dutch Quint B, FNP  Ascorbic Acid (VITAMIN C) 1000 MG tablet Take 1,000 mg by mouth daily at 2 am. Reported on 09/26/2015    [provider]  aspirin EC 81 MG tablet Take 81 mg by mouth daily at 2 am. Swallow whole.    [provider]  cholecalciferol (VITAMIN D) 25 MCG (1000 UNIT) tablet Take 1,000 Units by mouth daily at 2 am.    [provider]  clopidogrel (PLAVIX) 75 MG tablet TAKE 1 TABLET BY MOUTH DAILY 07/28/21   McLean-Scocuzza, Nino Glow, MD  Coenzyme Q10 (CO Q-10 PO) Take 1 capsule  by mouth daily at 2 am.    [provider]  dicyclomine (BENTYL) 10 MG capsule Take 1 capsule (10 mg total) by mouth 3 (three) times daily with meals as needed for spasms. 07/10/21   Ladene Artist, MD  ezetimibe (ZETIA) 10 MG tablet TAKE 1 TABLET BY MOUTH DAILY 07/28/21   McLean-Scocuzza, Nino Glow, MD  GINKGO BILOBA PO Take 1 capsule by mouth daily at 2 am.     [provider]  isosorbide mononitrate (IMDUR) 120 MG 24 hr tablet Take 1 tablet (120 mg total) by mouth daily. 11/03/21   Minus Breeding, MD  MAGNESIUM PO Take 1 tablet by mouth daily at 2 am.    [provider]  metoprolol succinate (TOPROL-XL) 25 MG 24 hr tablet Take 1 tablet (25 mg total) by mouth daily. 07/06/22 07/01/23  [name], MD  Multiple Vitamin (MULTIVITAMIN WITH MINERALS) TABS tablet Take 1 tablet by mouth daily at 2 am.    [provider]  niacin 250 MG tablet Take 250 mg by mouth daily.    [provider]  nitroGLYCERIN (NITROSTAT) 0.4 MG SL tablet Place 1 tablet (0.4 mg total) under the tongue every 5 (five) minutes x 3 doses as needed. 10/28/21   Minus Breeding, MD  Omega-3 Fatty Acids (FISH OIL) 1000 MG CAPS Take 2,000 mg by mouth daily at 2 am.     [provider]  pantoprazole (  PROTONIX) 40 MG tablet Take 1 tablet (40 mg total) by mouth daily at 2 am. 30 minutes. 10/31/21   McLean-Scocuzza, Nino Glow, MD  ramipril (ALTACE) 10 MG capsule TAKE 1 CAPSULE BY MOUTH ONCE DAILY 06/23/21   Dutch Quint B, FNP  rosuvastatin (CRESTOR) 40 MG tablet TAKE 1 TABLET AT BEDTIME 10/27/21   McLean-Scocuzza, Nino Glow, MD  tamsulosin (FLOMAX) 0.4 MG CAPS capsule Take 0.4 mg by mouth daily at 2 am.     [provider]  vitamin E 100 UNIT capsule Take 100 Units by mouth daily at 2 am.     [provider]      Allergies    Darvocet [propoxyphene n-acetaminophen] and Propoxyphene    Review of Systems   Review of Systems  Cardiovascular:  Positive for chest pain.  All  other systems reviewed and are negative.   Physical Exam Updated Vital Signs BP (!) 161/94 (BP Location: Right Arm)   Pulse 75   Temp 97.9 F (36.6 C) (Oral)   Resp 17   SpO2 97%  Physical Exam Vitals and nursing note reviewed.  Constitutional:      General: He is not in acute distress.    Appearance: He is well-developed.  HENT:     Head: Atraumatic.  Eyes:     Conjunctiva/sclera: Conjunctivae normal.  Cardiovascular:     Rate and Rhythm: Normal rate and regular rhythm.     Pulses: Normal pulses.     Heart sounds: Normal heart sounds.  Pulmonary:     Effort: Pulmonary effort is normal.     Breath sounds: Normal breath sounds. No wheezing, rhonchi or rales.  Abdominal:     Palpations: Abdomen is soft.     Tenderness: There is no abdominal tenderness.  Musculoskeletal:     Cervical back: Neck supple.     Right lower leg: No edema.     Left lower leg: No edema.  Skin:    Findings: No rash.  Neurological:     Mental Status: He is alert.     ED Results / Procedures / Treatments   Labs (all labs ordered are listed, but only abnormal results are displayed) Labs Reviewed  BASIC METABOLIC PANEL - Abnormal; Notable for the following components:      Result Value   CO2 21 (*)    Glucose, Bld 116 (*)    BUN 5 (*)    All other components within normal limits  CBC  TROPONIN I (HIGH SENSITIVITY)  TROPONIN I (HIGH SENSITIVITY)    EKG EKG Interpretation  Date/Time:  Wednesday August 05 2022 14:37:04 EDT Ventricular Rate:  68 PR Interval:  126 QRS Duration: 146 QT Interval:  444 QTC Calculation: 472 R Axis:   -76 Text Interpretation: Normal sinus rhythm Left axis deviation Right bundle branch block Abnormal ECG When compared with ECG of 12-Oct-2020 19:40, NSR now present Confirmed by Margaretmary Eddy [phone]) on 08/05/2022 4:26:15 PM  Radiology DG Chest 2 View  Result Date: 08/05/2022 CLINICAL DATA:  Left-sided chest tightness. EXAM: CHEST - 2 VIEW COMPARISON:  Oct 12, 2020 FINDINGS: Multiple sternal wires and vascular clips are noted. The heart size and mediastinal contours are within normal limits. Both lungs are clear. The visualized skeletal structures are unremarkable. IMPRESSION: 1. Evidence of prior median sternotomy/CABG. 2. No acute cardiopulmonary disease. Electronically Signed   By: Virgina Norfolk M.D.   On: 08/05/2022 15:26    Procedures Procedures    Medications Ordered in ED Medications -  No data to display  ED Course/ Medical Decision Making/ A&P                             Medical Decision Making Amount and/or Complexity of Data Reviewed Labs: ordered. Radiology: ordered.  Risk Decision regarding hospitalization.   BP (!) 161/94 (BP Location: Right Arm)   Pulse 75   Temp 97.9 F (36.6 C) (Oral)   Resp 17   SpO2 97%   1:18 PM  77 year old male with extensive medical history which includes CAD, hyperlipidemia, hypertension, diabetes, GERD, prior MI presenting today with complaints of chest pain.  Patient report for the past 4 to 5 days he has had intermittent chest discomfort.  He describes the discomfort more as like a muscle pull and tightness sensation across his chest sometimes radiates towards his jaw.  He also endorses mild fatigue.  He states repositioning his body some time aggravate his discomfort.  He does not endorse any exertional chest pain no lightheadedness or dizziness no nausea or diaphoresis.  Denies any fever or chills no productive cough.  States pain is very minimal at this time.  No specific treatment tried at home.  He did reach out to his cardiology office but due to his significant cardiac history they recommend patient to come to ER for further assessment.  On exam, patient is resting comfortably appears to be in no acute discomfort.  Heart with normal rate and rhythm, lungs are clear to auscultation bilaterally abdomen is soft nontender.  He has a small lower skin contusion noted to his epigastric  region with minimal tenderness.  He has mid sternotomy scar without any signs of infection.  He does not have any peripheral edema.  No JVD.   Cardiac workup initiated.  -Labs ordered, independently viewed and interpreted by me.  Labs remarkable for normal delta trop.  Electrolytes are reassuring -The patient was maintained on a cardiac monitor.  I personally viewed and interpreted the cardiac monitored which showed an underlying rhythm of: NSR -Imaging independently viewed and interpreted by me and I agree with radiologist's interpretation.  Result remarkable for CXR without acute changes -This patient presents to the ED for concern of chest pain, this involves an extensive number of treatment options, and is a complaint that carries with it a high risk of complications and morbidity.  The differential diagnosis includes acs, gastritis, gerd, msk, pna, pe, pleurisy -Co morbidities that complicate the patient evaluation includes cardiac disease, GERD, HTN, HLD -Treatment includes none -Reevaluation of the patient after these medicines showed that the patient resolved -PCP office notes or outside notes reviewed -Discussion with specialist cardiology who will see and determine disposition.  Anticipate admission -Escalation to admission/observation considered: cardiology will determine disposition 4:53 PM Appreciate consultation from cardiology team who will evaluate patient in the ER and determine disposition.        Final Clinical Impression(s) / ED Diagnoses Final diagnoses:  Angina pectoris [facility])    Rx / DC Orders ED Discharge Orders     None         Domenic Moras, PA-C 08/05/22 1828    Fransico Meadow, MD 08/07/22 [phone]

## 2022-08-05 NOTE — Telephone Encounter (Signed)
Received stat call from patient who states that he has been having chest discomfort for the last week, along with intermittent shortness of breath, and fatigue. Patient has significant history of CAD/Hx Cabg- advised patient that due to symptoms he should report to the ER to be seen. Patient states he does not feel like he is at a place where he has to go to the ER. Patient reports the discomfort is in the center of his chest over to the right side- states he could possibly have pulled a muscle last week but unsure. Patient reports that he has also been a little short of breath with exertion and more fatigued that usual. Advised patient he should be evaluated. Patient reports he will go be seen at the Urgent Care today and if anything changes he will go to ER immediately. Patient scheduled for APP OV on 3/22. Also advised patient he can try Nitro as well to see if this relieved symptoms- patient reports he has it on him but doesn't want to take any at this time.   Advised patient I would forward to Dr. [name] to make him aware.

## 2022-08-05 NOTE — Telephone Encounter (Signed)
Pt c/o of Chest Pain: STAT if CP now or developed within 24 hours  1. Are you having CP right now? Yes chest discomfort  2. Are you experiencing any other symptoms (ex. SOB, nausea, vomiting, sweating)?  More shortness of breath than usual, also tired  3. How long have you been experiencing CP? Since 07-27-22  4. Is your CP continuous or coming and going? Comes and goes  5. Have you taken Nitroglycerin?  He takes the time release every day ?

## 2022-08-05 NOTE — ED Triage Notes (Signed)
Patient complains of intermittent tightness in his left arm, left chest, and left side of his jaw that started one week ago. Patient states tightness is unchanged since onset, is unchanged by exertion. Denies nausea, denies dizziness, denies shortness of breath. Patient is alert, oriented, ambulating independently with steady gait, and is in no apparent distress.

## 2022-08-05 NOTE — ED Provider Notes (Signed)
  Physical Exam  BP 119/63   Pulse 60   Temp 97.9 F (36.6 C) (Oral)   Resp 16   SpO2 98%   Physical Exam  Procedures  Procedures  ED Course / MDM   Clinical Course as of 08/05/22 1915  Wed Aug 05, 2022  1859 Assumed care from Domenic Moras PA.  77 year old male with a history of CAD status post CABG who presented to the emergency department with chest discomfort for several days.  Called the triage nurse from cardiology who referred him into the emergency department.  Initial EKG without ischemic changes.  Initial troponin WNL. Discussed with cardiology who will evaluate the patient shortly to see if admission for provocative testing is indicated at this time.  On my repeat evaluation is in no acute distress.  Chest pain does not appear to be reproducible on exam.   [RP]  1911 Patient has been evaluated by cardiologist Dr. Gardiner Rhyme.  Feels that with reassuring EKG and troponin that he can follow-up with cardiology on Friday as an outpatient.  Also instructed him to follow-up with his primary doctor in several days.  Return precautions discussed prior to discharge. [RP]    Clinical Course User Index [RP] Fransico Meadow, MD   Medical Decision Making Amount and/or Complexity of Data Reviewed Labs: ordered. Radiology: ordered.  Risk Decision regarding hospitalization.     Fransico Meadow, MD 08/05/22 [phone]

## 2022-08-05 NOTE — ED Notes (Signed)
Patient transported to X-ray 

## 2022-08-05 NOTE — ED Notes (Signed)
Pt provided with AVS.  Education complete; all questions answered.  Pt leaving ED in stable condition at this time, ambulatory with all belongings.

## 2022-08-05 NOTE — Progress Notes (Signed)
Cardiology Clinic Note   Patient Name: Kevin Webb Date of Encounter: 08/07/2022  Primary Care Provider:  [name], MD Primary Cardiologist:  Minus Breeding, MD  Patient Profile     77 y.o. male with a hx of CAD s/p CABG with subsequent PCI, carotid artery stenosis, DM, hypertension, hyperlipidemia, and BPH . Seen in ED on 08/06/2022 for chest pain. Seen by Dr. Gardiner Rhyme on consultation. He was ruled out for ACS.   He is status post CABG x 4 in 2000 with LIMA-LAD, SVG-IM, SVG-acute marginal, SVG-RCA with subsequent PCI including BMS to SVG-RI in 2012, DES-SVG-RCA and DES-SVG-RI in 2018. His last heart catheterization was July 2021.   Heart catheterization 7/60/21 showed patent LIMA-LAD with occluded SVG grafts to both the PDA and ramus.. No PCI given good collateral flow to the distal RCA and very faint collaterals to the ramus intermediate. He was deemed a poor candidate for CTO PCI of the RCA or ramus intermediate. He was also felt not suitable for redo CABG. Medical therapy was increased by adding Imdur 30 mg daily.   Past Medical History    Past Medical History:  Diagnosis Date   Acute cholecystitis 04/18/2013   Adenomatous colon polyp 12/2004   Adenomatous colon polyp 12/16/2004   Anemia    Anemia    Blood transfusion without reported diagnosis    BPH associated with nocturia    CAD (coronary artery disease)    a. s/p CABG x4 in 2000 with LIMA-LAD, reverse SVG-IM, reverse SVG-acute margin and reverse SVG-RCA b. s/p BMS to SVG-RI in 2012 c. 12/2016: PCI/DES to SVG-RCA and PCI/DES to SVG-RI (Dr. [name])    Carotid artery stenosis    mild    Cataract    Cervical radiculopathy 03/26/2020   Clotting disorder ([place])    COLONIC POLYPS, HX OF 09/30/2006   Qualifier: Diagnosis of   By: Linna Darner MD, William          Polypectomy 2004, 2009; Dr Fuller Plan         Coronary atherosclerosis of artery bypass graft 09/30/2006   Qualifier: Diagnosis of   By: Linna Darner MD, William       Diabetes  mellitus without complication ([place]) [id number]   recent A1C 6.1 12/25/16 controlled with diet and exercise    Diverticulitis 2008/2009   Diverticulitis    Dizziness 04/21/2020   Educated about COVID-19 virus infection 10/06/2018   GERD 02/22/2007   Qualifier: Diagnosis of   By: Linna Darner MD, Gwyndolyn Saxon       GERD (gastroesophageal reflux disease)    controlled as of 04/01/17    HSV infection    HTN (hypertension)    HTN (hypertension) 09/30/2006   Qualifier: Diagnosis of   By: Linna Darner MD, William       Hx of CABG 12/25/2020   Hx of dysplastic nevus 05/04/2018   upper back spinal    Hyperglycemia    Hyperlipidemia    Hyperlipidemia LDL goal <70 01/08/2017   Impingement syndrome of left shoulder 2016   Impingement syndrome of left shoulder 05/18/2014   Kidney cysts 06/20/2019   Low back pain 02/15/2019   Low testosterone    Dr [name], [facility] ,[place]   Myocardial infarction [facility])    NSTEMI (non-ST elevated myocardial infarction) ([place]) 12/25/2016   Osteoarthritis of right knee    With trace effusion   Peyronie disease    Piriformis syndrome of right side 09/22/2019   Prediabetes 03/29/2018   Prostatitis 12/23/2017   Right arm weakness 02/02/2020  RLS (restless legs syndrome) 08/15/2013   RLS (restless legs syndrome) 08/15/2013   Rotator cuff injury    right; at 24-43 years old.    Sinus bradycardia    Sinus bradycardia    Thrombocytopenia (HCC)    TIA (transient ischemic attack) 01/19/2020   Tubular adenoma of colon 06/26/2020   Type 2 diabetes mellitus without complication, without long-term current use of insulin ([place]) 09/28/2016   Unstable angina ([place]) 12/01/2019   Vitamin D deficiency    Past Surgical History:  Procedure Laterality Date   CHOLECYSTECTOMY N/A 04/21/2013   Procedure: LAPAROSCOPIC CHOLECYSTECTOMY WITH INTRAOPERATIVE CHOLANGIOGRAM;  Surgeon: Haywood Lasso, MD;  Location: [facility];  Service: General;  Laterality: N/A;   CHOLECYSTECTOMY     COLONOSCOPY W/  POLYPECTOMY  2004 & 2009    X 2; Dr Fuller Plan; due 2014   CORONARY ANGIOPLASTY     CORONARY ARTERY BYPASS GRAFT  04/1999   4 vessels   CORONARY STENT INTERVENTION N/A 12/28/2016   Procedure: CORONARY STENT INTERVENTION;  Surgeon: Lorretta Harp, MD;  Location: [facility];  Service: Cardiovascular;  Laterality: N/A;   CORONARY STENT PLACEMENT  2012   Plavix   LAPAROSCOPIC CHOLECYSTECTOMY  04/21/2013   Dr Margot Chimes; acutecholecystitis with necrosis   LEFT HEART CATH AND CORS/GRAFTS ANGIOGRAPHY N/A 12/28/2016   Procedure: LEFT HEART CATH AND CORS/GRAFTS ANGIOGRAPHY;  Surgeon: Lorretta Harp, MD;  Location: [facility];  Service: Cardiovascular;  Laterality: N/A;   LEFT HEART CATH AND CORS/GRAFTS ANGIOGRAPHY N/A 12/01/2019   Procedure: LEFT HEART CATH AND CORS/GRAFTS ANGIOGRAPHY;  Surgeon: Martinique, Peter M, MD;  Location: [facility];  Service: Cardiovascular;  Laterality: N/A;   VASECTOMY      Allergies  Allergies  Allergen Reactions   Darvocet [Propoxyphene N-Acetaminophen] Hives   Propoxyphene Hives    History of Present Illness    [name] comes today on ED follow-up for recurrent chest discomfort.  Rule out for ACS.  He is very aware of any chest discomfort which he may be experiencing due to his history of CAD as outlined above.  He works as a Development worker, community and he stated that one of his coworkers died at age 1 last week while at work and this made him more concerned about his own cardiac health and follow-ups.  He is quite active at work carrying heavy buckets and other partials while climbing stairs or walking several feet without recurrent chest discomfort or angina symptoms at rest.  He is medically compliant.  He denies any dizziness, dyspnea on exertion out of proportion to normal tasks, or any edema.  He   Home Medications    Current Outpatient Medications  Medication Sig Dispense Refill   amLODipine (NORVASC) 5 MG tablet TAKE 1 TABLET BY MOUTH DAILY 90 tablet  3   Ascorbic Acid (VITAMIN C) 1000 MG tablet Take 1,000 mg by mouth daily at 2 am. Reported on 09/26/2015     aspirin EC 81 MG tablet Take 81 mg by mouth daily at 2 am. Swallow whole.     cholecalciferol (VITAMIN D) 25 MCG (1000 UNIT) tablet Take 1,000 Units by mouth daily at 2 am.     clopidogrel (PLAVIX) 75 MG tablet TAKE 1 TABLET BY MOUTH DAILY 90 tablet 3   Coenzyme Q10 (CO Q-10 PO) Take 1 capsule by mouth daily at 2 am.     dicyclomine (BENTYL) 10 MG capsule Take 1 capsule (10 mg total) by mouth 3 (three)  times daily with meals as needed for spasms. 90 capsule 11   dutasteride (AVODART) 0.5 MG capsule Take 0.5 mg by mouth daily.     ezetimibe (ZETIA) 10 MG tablet TAKE 1 TABLET BY MOUTH DAILY 90 tablet 3   GINKGO BILOBA PO Take 1 capsule by mouth daily at 2 am.      isosorbide mononitrate (IMDUR) 120 MG 24 hr tablet Take 1 tablet (120 mg total) by mouth daily. 90 tablet 3   MAGNESIUM PO Take 1 tablet by mouth daily at 2 am.     metoprolol succinate (TOPROL-XL) 25 MG 24 hr tablet Take 1 tablet (25 mg total) by mouth daily. 90 tablet 3   Multiple Vitamin (MULTIVITAMIN WITH MINERALS) TABS tablet Take 1 tablet by mouth daily at 2 am.     niacin 250 MG tablet Take 250 mg by mouth daily.     nitroGLYCERIN (NITROSTAT) 0.4 MG SL tablet Place 1 tablet (0.4 mg total) under the tongue every 5 (five) minutes x 3 doses as needed. 25 tablet 4   Omega-3 Fatty Acids (FISH OIL) 1000 MG CAPS Take 2,000 mg by mouth daily at 2 am.      pantoprazole (PROTONIX) 40 MG tablet Take 1 tablet (40 mg total) by mouth daily at 2 am. 30 minutes. 90 tablet 3   ramipril (ALTACE) 10 MG capsule TAKE 1 CAPSULE BY MOUTH ONCE DAILY 90 capsule 3   rosuvastatin (CRESTOR) 40 MG tablet TAKE 1 TABLET AT BEDTIME 90 tablet 3   tamsulosin (FLOMAX) 0.4 MG CAPS capsule Take 0.4 mg by mouth daily at 2 am.      vitamin E 100 UNIT capsule Take 100 Units by mouth daily at 2 am.      Current Facility-Administered Medications  Medication Dose  Route Frequency Provider Last Rate Last Admin   sodium chloride flush (NS) 0.9 % injection 3 mL  3 mL Intravenous Q12H Almyra Deforest, PA         Family History    Family History  Problem Relation Age of Onset   Coronary artery disease Mother        CABG in 60s   CVA Mother        cns bleed from warfarin   Aneurysm Mother    Aortic aneurysm Father 73       ? abdominal   Heart attack Father 51   Cholecystitis Sister    Diabetes Brother    Diabetes Other        PGaunt   Cancer Neg Hx    Colon cancer Neg Hx    Prostate cancer Neg Hx    Esophageal cancer Neg Hx    Stomach cancer Neg Hx    Rectal cancer Neg Hx    He indicated that his mother is deceased. He indicated that his father is deceased. He indicated that the status of his sister is unknown. He indicated that the status of his brother is unknown. He indicated that his son is alive. He indicated that the status of his neg hx is unknown. He reported the following about one of his others: per pt most family members died in 40-50s of heart disease/MI.  Social History    Social History   Socioeconomic History   Marital status: Married    Spouse name: Not on file   Number of children: 1   Years of education: Not on file   Highest education level: Not on file  Occupational History   Occupation:  Comment: plumber  Tobacco Use   Smoking status: Never   Smokeless tobacco: Never  Vaping Use   Vaping Use: Never used  Substance and Sexual Activity   Alcohol use: Yes    Alcohol/week: 10.0 - 12.0 standard drinks of alcohol    Types: 10 - 12 Cans of beer per week    Comment: drinks beer daily; whiskey rarely, used to be daily 1-2 shots   Drug use: No   Sexual activity: Yes    Birth control/protection: None  Other Topics Concern   Not on file  Social History Narrative   Remarried 2014   1 son age 75 as of 03/2017       Lives at home with spouse   Right handed   Caffeine: 1-2 cups/day (coffee, green tea)   Former  Engineer, structural     Social Determinants of [place] Strain: [place]  (12/01/2021)   Overall Financial Resource Strain (CARDIA)    Difficulty of Paying Living Expenses: Not hard at all  Food Insecurity: No Food Insecurity (12/01/2021)   Hunger Vital Sign    Worried About Running Out of Food in the Last Year: Never true    [place] in the Last Year: Never true  Transportation Needs: No Transportation Needs (12/01/2021)   PRAPARE - Hydrologist (Medical): No    Lack of Transportation (Non-Medical): No  Physical Activity: Sufficiently Active (12/01/2021)   Exercise Vital Sign    Days of Exercise per Week: 7 days    Minutes of Exercise per Session: 60 min  Stress: No Stress Concern Present (12/01/2021)   [place]    Feeling of Stress : Not at all  Social Connections: Unknown (12/01/2021)   Social Connection and Isolation Panel [NHANES]    Frequency of Communication with Friends and Family: Not on file    Frequency of Social Gatherings with Friends and Family: Not on file    Attends Religious Services: Not on file    Active Member of Clubs or Organizations: Not on file    Attends Archivist Meetings: Not on file    Marital Status: Married  Intimate Partner Violence: Not At Risk (12/01/2021)   Humiliation, Afraid, Rape, and Kick questionnaire    Fear of Current or Ex-Partner: No    Emotionally Abused: No    Physically Abused: No    Sexually Abused: No     Review of Systems    General:  No chills, fever, night sweats or weight changes.  Cardiovascular:  No chest pain, dyspnea on exertion, edema, orthopnea, palpitations, paroxysmal nocturnal dyspnea. Dermatological: No rash, lesions/masses Respiratory: No cough, dyspnea Urologic: No hematuria, dysuria Abdominal:   No nausea, vomiting, diarrhea, bright red blood per rectum, melena, or  hematemesis Neurologic:  No visual changes, wkns, changes in mental status. All other systems reviewed and are otherwise negative except as noted above.     Physical Exam    VS:  BP 119/71   Pulse (!) 54   Ht 5' 8.5" (1.74 m)   Wt 206 lb (93.4 kg)   SpO2 96%   BMI 30.87 kg/m  , BMI Body mass index is 30.87 kg/m.     GEN: Well nourished, well developed, in no acute distress. HEENT: normal. Neck: Supple, no JVD, carotid bruits, or masses. Cardiac: RRR, no murmurs, rubs, or gallops. No clubbing, cyanosis, edema.  Radials/DP/PT 2+ and  equal bilaterally.  Respiratory:  Respirations regular and unlabored, clear to auscultation bilaterally. GI: Soft, nontender, nondistended, BS + x 4. MS: no deformity or atrophy. Skin: warm and dry, no rash. Neuro:  Strength and sensation are intact. Psych: Normal affect.  Accessory Clinical Findings    ECG personally reviewed by me today-sinus bradycardia with right bundle branch block heart rate of 54 bpm- No acute changes  Lab Results  Component Value Date   WBC 6.7 08/05/2022   HGB 14.7 08/05/2022   HCT 42.9 08/05/2022   MCV 90.1 08/05/2022   PLT 162 08/05/2022   Lab Results  Component Value Date   CREATININE 0.90 08/05/2022   BUN 5 (L) 08/05/2022   NA 135 08/05/2022   K 4.0 08/05/2022   CL 99 08/05/2022   CO2 21 (L) 08/05/2022   Lab Results  Component Value Date   ALT 17 11/11/2021   AST 22 11/11/2021   ALKPHOS 46 11/11/2021   BILITOT 1.0 11/11/2021   Lab Results  Component Value Date   CHOL 138 11/11/2021   HDL 53.90 11/11/2021   LDLCALC 63 11/11/2021   LDLDIRECT 74.0 10/24/2019   TRIG 106.0 11/11/2021   CHOLHDL 3 11/11/2021    Lab Results  Component Value Date   HGBA1C 6.5 11/11/2021    Review of Prior Studies: Heart cath 12/01/2019: Ost LAD to Prox LAD lesion is 100% stenosed. 1st Diag lesion is 60% stenosed. Ramus lesion is 100% stenosed. Prox Cx lesion is 40% stenosed. Prox RCA lesion is 100%  stenosed. Mid RCA lesion is 100% stenosed. Prox Graft lesion is 100% stenosed. Prox Graft lesion is 100% stenosed. Origin to Prox Graft lesion is 100% stenosed. The left ventricular systolic function is normal. LV end diastolic pressure is normal. The left ventricular ejection fraction is 55-65% by visual estimate.   1. Severe 3 vessel occlusive CAD involving the ostial LAD, ostial ramus intermediate and proximal RCA. 2. Occluded SVG to PDA 3. Occluded SVG to ramus intermediate 4. Patent LIMA to the LAD.  5. Normal LV function 6. Normal LVEDP   Plan: would recommend medical management. There are good collaterals to the distal RCA. Very faint collaterals to the ramus intermediate. He is a poor candidate for CTO PCI of the RCA with ambiguous proximal cap and severe diffuse disease in the mid vessel based on old films- I suspect it is occluded in the mid vessel as well now. The ramus is also not a candidate for CTO PCI and is not a suitable target for redo CABG. Will add Imdur 30 mg daily.   Assessment & Plan   1.  Coronary artery disease: History of CABG x 4 in 2000, repeat catheterization on 11/21/2019 revealing patent LIMA to LAD with occluded SVG grafts to both the PDA and ramus with good collaterals.  I have reviewed this with the patient and shown him the cardiac catheterization illustration.  Reassurance is given.  Due to total occlusion of the RCA or ramus intermediate PCI was not recommended as he was a poor candidate for this procedure.  Continue medical management.  His recurrent discomfort in his chest I do not believe is cardiac in etiology.  He is not experiencing any chest discomfort with exertion which she does daily at his job which includes heavy lifting, carrying heavy objects, and climbing stairs.  Will continue watchful waiting.  2.  Musculoskeletal chest discomfort: He has a history of some musculoskeletal issues with his shoulders, osteoarthritis, which may be contributing to  some of his discomfort on the left side of his chest and left shoulder.  I have advised him to see orthopedic physician or primary care for further testing and imaging at their discretion.  3.  Hypercholesterolemia: Remains on rosuvastatin 40 mg daily.  I reviewed his most recent labs which were completed in early December 2023.  He will continue his regimen along with low-cholesterol Mediterranean diet.  4.  Hypertension: Currently well-controlled.  Low-sodium diet and ongoing medical compliance is recommended.  No changes in regimen at this time.    Current medicines are reviewed at length with the patient today.  I have spent 25 min's  dedicated to the care of this patient on the date of this encounter to include pre-visit review of records, assessment, management and diagnostic testing,with shared decision making. Signed, Phill Myron. West Pugh, ANP, AACC   08/07/2022 1:00 PM      Office [phone] Fax [phone]  Notice: This dictation was prepared with Dragon dictation along with smaller phrase technology. Any transcriptional errors that result from this process are unintentional and may not be corrected upon review.

## 2022-08-07 ENCOUNTER — Encounter: Payer: Self-pay | Admitting: Adult Health

## 2022-08-07 ENCOUNTER — Ambulatory Visit: Payer: Medicare Other | Attending: Adult Health | Admitting: Adult Health

## 2022-08-07 VITALS — BP 119/71 | HR 54 | Ht 68.5 in | Wt 206.0 lb

## 2022-08-07 DIAGNOSIS — I25118 Atherosclerotic heart disease of native coronary artery with other forms of angina pectoris: Secondary | ICD-10-CM | POA: Insufficient documentation

## 2022-08-07 DIAGNOSIS — E7800 Pure hypercholesterolemia, unspecified: Secondary | ICD-10-CM

## 2022-08-07 DIAGNOSIS — R079 Chest pain, unspecified: Secondary | ICD-10-CM | POA: Diagnosis not present

## 2022-08-07 DIAGNOSIS — I10 Essential (primary) hypertension: Secondary | ICD-10-CM | POA: Diagnosis not present

## 2022-08-07 NOTE — Patient Instructions (Signed)
Medication Instructions:  No Changes *If you need a refill on your cardiac medications before your next appointment, please call your pharmacy*   Lab Work: No Labs If you have labs (blood work) drawn today and your tests are completely normal, you will receive your results only by: [place] (if you have MyChart) OR A paper copy in the mail If you have any lab test that is abnormal or we need to change your treatment, we will call you to review the results.   Testing/Procedures: No Testing   Follow-Up: At [facility], you and your health needs are our priority.  As part of our continuing mission to provide you with exceptional heart care, we have created designated Provider Care Teams.  These Care Teams include your primary Cardiologist (physician) and Advanced Practice Providers (APPs -  Physician Assistants and Nurse Practitioners) who all work together to provide you with the care you need, when you need it.  We recommend signing up for the patient portal called "MyChart".  Sign up information is provided on this After Visit Summary.  MyChart is used to connect with patients for Virtual Visits (Telemedicine).  Patients are able to view lab/test results, encounter notes, upcoming appointments, etc.  Non-urgent messages can be sent to your provider as well.   To learn more about what you can do with MyChart, go to [email or web address].    Your next appointment:   4 month(s)  Provider:   Minus Breeding, MD

## 2022-08-11 ENCOUNTER — Telehealth: Payer: Self-pay

## 2022-08-11 NOTE — Telephone Encounter (Signed)
     Patient  visit on 3/20  at [facility]    Have you been able to follow up with your primary care physician? Yes   The patient was or was not able to obtain any needed medicine or equipment. Yes   Are there diet recommendations that you are having difficulty following? Na   Patient expresses understanding of discharge instructions and education provided has no other needs at this time. Yes     [facility] [phone] [address], [place], [place] [address] Phone: [phone] Email: [email or web address]

## 2022-08-24 DIAGNOSIS — M542 Cervicalgia: Secondary | ICD-10-CM | POA: Diagnosis not present

## 2022-08-24 DIAGNOSIS — M9901 Segmental and somatic dysfunction of cervical region: Secondary | ICD-10-CM | POA: Diagnosis not present

## 2022-08-26 ENCOUNTER — Other Ambulatory Visit: Payer: Self-pay | Admitting: Gastroenterology

## 2022-08-31 ENCOUNTER — Telehealth: Payer: Self-pay | Admitting: Gastroenterology

## 2022-08-31 DIAGNOSIS — R1032 Left lower quadrant pain: Secondary | ICD-10-CM

## 2022-08-31 MED ORDER — DICYCLOMINE HCL 10 MG PO CAPS: 10.0000 mg | ORAL_CAPSULE | Freq: Three times a day (TID) | ORAL | 11 refills | Status: DC

## 2022-08-31 MED ORDER — METRONIDAZOLE 500 MG PO TABS: 500.0000 mg | ORAL_TABLET | Freq: Two times a day (BID) | ORAL | 0 refills | Status: AC

## 2022-08-31 MED ORDER — CIPROFLOXACIN HCL 500 MG PO TABS: 500.0000 mg | ORAL_TABLET | Freq: Two times a day (BID) | ORAL | 0 refills | Status: AC

## 2022-08-31 NOTE — Telephone Encounter (Signed)
I have sent the refills to the pt pharmacy- he has been advised.

## 2022-08-31 NOTE — Telephone Encounter (Signed)
Cipro 500 mg po bid, #14 Flagyl 500 mg po bid, #14 Dicyclomine 10 mg po tid prn, 1 year of refills

## 2022-08-31 NOTE — Telephone Encounter (Signed)
LLQ pain that feels like it does when he has diverticulitis.  He had 3 capsules of flagyl and Cipro and took those and all symptoms resolved.  He would also like to have refills on Bentyl.  He would like to have a refill of both to finish the course.  He was last seen 07/10/21.  Dr Russella Dar ok to refill the pt does not want to make an appt at this time.

## 2022-08-31 NOTE — Telephone Encounter (Signed)
Inbound call from patient, would like refills for Flagyl, Bentyl, and Cipro. Patient advised he may need an appointment. He stated a nurse could call him to advise if need be.

## 2022-08-31 NOTE — Telephone Encounter (Signed)
Left message on machine to call back  

## 2022-09-15 ENCOUNTER — Telehealth: Payer: Self-pay | Admitting: Family Medicine

## 2022-09-15 DIAGNOSIS — I10 Essential (primary) hypertension: Secondary | ICD-10-CM

## 2022-09-15 MED ORDER — RAMIPRIL 10 MG PO CAPS: 10.0000 mg | ORAL_CAPSULE | Freq: Every day | ORAL | 3 refills | Status: DC

## 2022-09-15 MED ORDER — AMLODIPINE BESYLATE 5 MG PO TABS: 5.0000 mg | ORAL_TABLET | Freq: Every day | ORAL | 3 refills | Status: DC

## 2022-09-15 NOTE — Telephone Encounter (Signed)
Prescription Request  09/15/2022  LOV: 05/04/2022  What is the name of the medication or equipment? amLODipine (NORVASC) 5 MG tablet and ramipril (ALTACE) 10 MG capsule  Have you contacted your pharmacy to request a refill? Yes   Which pharmacy would you like this sent to?   [facility] - [place], [place] - [address] [place] [address] [place] [address] Phone: [phone] Fax: [phone]      Patient notified that their request is being sent to the clinical staff for review and that they should receive a response within 2 business days.   Please advise at Mobile [phone] (mobile)

## 2022-09-17 ENCOUNTER — Other Ambulatory Visit: Payer: Self-pay | Admitting: Cardiology

## 2022-10-02 ENCOUNTER — Ambulatory Visit (INDEPENDENT_AMBULATORY_CARE_PROVIDER_SITE_OTHER): Payer: Medicare Other | Admitting: Family

## 2022-10-02 ENCOUNTER — Encounter (HOSPITAL_BASED_OUTPATIENT_CLINIC_OR_DEPARTMENT_OTHER): Payer: Self-pay | Admitting: Family

## 2022-10-02 ENCOUNTER — Telehealth: Payer: Self-pay | Admitting: Cardiology

## 2022-10-02 ENCOUNTER — Encounter: Payer: Self-pay | Admitting: Family Medicine

## 2022-10-02 VITALS — BP 142/70 | HR 64 | Ht 68.5 in | Wt 205.0 lb

## 2022-10-02 DIAGNOSIS — I10 Essential (primary) hypertension: Secondary | ICD-10-CM | POA: Diagnosis not present

## 2022-10-02 DIAGNOSIS — I25118 Atherosclerotic heart disease of native coronary artery with other forms of angina pectoris: Secondary | ICD-10-CM

## 2022-10-02 DIAGNOSIS — E785 Hyperlipidemia, unspecified: Secondary | ICD-10-CM

## 2022-10-02 DIAGNOSIS — E118 Type 2 diabetes mellitus with unspecified complications: Secondary | ICD-10-CM

## 2022-10-02 DIAGNOSIS — E119 Type 2 diabetes mellitus without complications: Secondary | ICD-10-CM | POA: Diagnosis not present

## 2022-10-02 MED ORDER — ISOSORBIDE MONONITRATE ER 120 MG PO TB24: 120.0000 mg | ORAL_TABLET | Freq: Every day | ORAL | 1 refills | Status: DC

## 2022-10-02 NOTE — Telephone Encounter (Signed)
Received a call from patient he stated he has been having chest discomfort for the past several weeks.No chest pain at present.Appointment scheduled with Gillian Shields NP this afternoon at 1:30 pm at [facility] office.

## 2022-10-02 NOTE — Telephone Encounter (Signed)
Pt c/o of Chest Pain: STAT if CP now or developed within 24 hours  1. Are you having CP right now? Not right now but yesterday  2. Are you experiencing any other symptoms (ex. SOB, nausea, vomiting, sweating)? No  3. How long have you been experiencing CP? Last month and he has to go to the hospital because of it  4. Is your CP continuous or coming and going? Coming and going  5. Have you taken Nitroglycerin? No.  ?

## 2022-10-02 NOTE — Progress Notes (Signed)
Office Visit    Patient Name: Kevin Webb Date of Encounter: 10/02/2022  PCP:  Kevin Allan, MD   [facility]  Cardiologist:  Kevin Rotunda, MD  Advanced Practice Provider:  No care team member to display Electrophysiologist:  None      Chief Complaint    Kevin Webb is a 77 y.o. male presents today for chest pain   Past Medical History    Past Medical History:  Diagnosis Date   Acute cholecystitis 04/18/2013   Adenomatous colon polyp 12/2004   Adenomatous colon polyp 12/16/2004   Anemia    Anemia    Blood transfusion without reported diagnosis    BPH associated with nocturia    CAD (coronary artery disease)    a. s/p CABG x4 in 2000 with LIMA-LAD, reverse SVG-IM, reverse SVG-acute margin and reverse SVG-RCA b. s/p BMS to SVG-RI in 2012 c. 12/2016: PCI/DES to SVG-RCA and PCI/DES to SVG-RI (Dr. Antoine Webb)    Carotid artery stenosis    mild    Cataract    Cervical radiculopathy 03/26/2020   Clotting disorder (HCC)    COLONIC POLYPS, HX OF 09/30/2006   Qualifier: Diagnosis of   By: Kevin Ren MD, Kevin Webb          Polypectomy 2004, 2009; Dr Kevin Webb         Coronary atherosclerosis of artery bypass graft 09/30/2006   Qualifier: Diagnosis of   By: Kevin Ren MD, Kevin Webb       Diabetes mellitus without complication (HCC) 09/28/2016   recent A1C 6.1 12/25/16 controlled with diet and exercise    Diverticulitis 2008/2009   Diverticulitis    Dizziness 04/21/2020   Educated about COVID-19 virus infection 10/06/2018   GERD 02/22/2007   Qualifier: Diagnosis of   By: Kevin Ren MD, Kevin Webb       GERD (gastroesophageal reflux disease)    controlled as of 04/01/17    HSV infection    HTN (hypertension)    HTN (hypertension) 09/30/2006   Qualifier: Diagnosis of   By: Kevin Ren MD, Kevin Webb       Hx of CABG 12/25/2020   Hx of dysplastic nevus 05/04/2018   upper back spinal    Hyperglycemia    Hyperlipidemia    Hyperlipidemia LDL goal <70 01/08/2017   Impingement  syndrome of left shoulder 2016   Impingement syndrome of left shoulder 05/18/2014   Kidney cysts 06/20/2019   Low back pain 02/15/2019   Low testosterone    Dr Kevin Webb, Kevin Webb ,[place]   Myocardial infarction [facility])    NSTEMI (non-ST elevated myocardial infarction) (HCC) 12/25/2016   Osteoarthritis of right knee    With trace effusion   Peyronie disease    Piriformis syndrome of right side 09/22/2019   Prediabetes 03/29/2018   Prostatitis 12/23/2017   Right arm weakness 02/02/2020   RLS (restless legs syndrome) 08/15/2013   RLS (restless legs syndrome) 08/15/2013   Rotator cuff injury    right; at 73-40 years old.    Sinus bradycardia    Sinus bradycardia    Thrombocytopenia (HCC)    TIA (transient ischemic attack) 01/19/2020   Tubular adenoma of colon 06/26/2020   Type 2 diabetes mellitus without complication, without long-term current use of insulin (HCC) 09/28/2016   Unstable angina (HCC) 12/01/2019   Vitamin D deficiency    Past Surgical History:  Procedure Laterality Date   CHOLECYSTECTOMY N/A 04/21/2013   Procedure: LAPAROSCOPIC CHOLECYSTECTOMY WITH INTRAOPERATIVE CHOLANGIOGRAM;  Surgeon: Kevin Paris, MD;  Location: [facility] OR;  Service: General;  Laterality: N/A;   CHOLECYSTECTOMY     COLONOSCOPY W/ POLYPECTOMY  2004 & 2009    X 2; Dr Kevin Webb; due 2014   CORONARY ANGIOPLASTY     CORONARY ARTERY BYPASS GRAFT  04/1999   4 vessels   CORONARY STENT INTERVENTION N/A 12/28/2016   Procedure: CORONARY STENT INTERVENTION;  Surgeon: Kevin Gess, MD;  Location: [facility] INVASIVE CV LAB;  Service: Cardiovascular;  Laterality: N/A;   CORONARY STENT PLACEMENT  2012   Plavix   LAPAROSCOPIC CHOLECYSTECTOMY  04/21/2013   Dr Kevin Webb; acutecholecystitis with Webb   LEFT HEART CATH AND CORS/GRAFTS ANGIOGRAPHY N/A 12/28/2016   Procedure: LEFT HEART CATH AND CORS/GRAFTS ANGIOGRAPHY;  Surgeon: Kevin Gess, MD;  Location: [facility] INVASIVE CV LAB;  Service: Cardiovascular;  Laterality: N/A;    LEFT HEART CATH AND CORS/GRAFTS ANGIOGRAPHY N/A 12/01/2019   Procedure: LEFT HEART CATH AND CORS/GRAFTS ANGIOGRAPHY;  Surgeon: Webb, Kevin M, MD;  Location: [facility] INVASIVE CV LAB;  Service: Cardiovascular;  Laterality: N/A;   VASECTOMY      Allergies  Allergies  Allergen Reactions   Darvocet [Propoxyphene N-Acetaminophen] Hives   Propoxyphene Hives    History of Present Illness    [name] is a 77 y.o. male with a hx of CAD s/p CABG with subsequent PCI, carotid stenosis, DM2, HTN, HLD, BPH, RBBB last seen 08/07/22.  Prior CABG X4 in 2000 with LIMA-LAD, SVG-OM, SVG-acute marginal, SVG-RCA with subsequent PCI including BMS to SVG-RI in 2012, DES-SVG-RCA and DES to SVG-RI in 2018.  Most recent LHC 11/2019 with patent LIMA-LAD with occluded SVG graft to both PDA and ramus.  No PCI given good collateral flow to distal RCA and faint collaterals to the RI.  He was deemed a poor candidate for CTO PCI of the RCA or RI and also not felt suitable for redo CABG.  Last seen 08/07/22 after ED visit for chest pain.  His recurrent chest discomfort was felt to be musculoskeletal in etiology.  Presents today for follow up independently. 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 Describes as a chest discomfort but not a pain. Tells me even while sitting at rest in clinic today he has sensation of someone "pushing" on his bilateral collar bone.  This discomfort is predominantly at rest and he describes it as feeling like he "needs to stretch ".  This is different than his prior anginal equivalent. Feels he is more short of breath with activity and a bit more tired. Also notes he has had a head cold recently and is uncertain if this is contributory. He took nitroglycerin on Wednesday around 5pm, not much improvement. Thursday morning he took one in the morning (BP prior to taking 120/72) and then had some hypotension with SBP 90s.  Otherwise blood pressure has been well-controlled.  No lightheadedness,  dizziness, near STEMI, syncope.  Reports rare episode of palpitations in the evening but these are infrequent and not bothersome.   EKGs/Labs/Other Studies Reviewed:   The following studies were reviewed today: Cardiac Studies & Procedures   CARDIAC CATHETERIZATION  CARDIAC CATHETERIZATION 12/01/2019  Narrative  Ost LAD to Prox LAD lesion is 100% stenosed.  1st Diag lesion is 60% stenosed.  Ramus lesion is 100% stenosed.  Prox Cx lesion is 40% stenosed.  Prox RCA lesion is 100% stenosed.  Mid RCA lesion is 100% stenosed.  Prox Graft lesion is 100% stenosed.  Prox Graft lesion is 100% stenosed.  Origin to Prox Graft  lesion is 100% stenosed.  The left ventricular systolic function is normal.  LV end diastolic pressure is normal.  The left ventricular ejection fraction is 55-65% by visual estimate.  1. Severe 3 vessel occlusive CAD involving the ostial LAD, ostial ramus intermediate and proximal RCA. 2. Occluded SVG to PDA 3. Occluded SVG to ramus intermediate 4. Patent LIMA to the LAD. 5. Normal LV function 6. Normal LVEDP  Plan: would recommend medical management. There are good collaterals to the distal RCA. Very faint collaterals to the ramus intermediate. He is a poor candidate for CTO PCI of the RCA with ambiguous proximal cap and severe diffuse disease in the mid vessel based on old films- I suspect it is occluded in the mid vessel as well now. The ramus is also not a candidate for CTO PCI and is not a suitable target for redo CABG. Will add Imdur 30 mg daily.  Findings Coronary Findings Diagnostic  Dominance: Right  Left Main Vessel was injected. Vessel is normal in caliber. Vessel is angiographically normal.  Left Anterior Descending Ost LAD to Prox LAD lesion is 100% stenosed.  First Diagonal Branch 1st Diag lesion is 60% stenosed. The lesion is eccentric.  Ramus Intermedius Ramus lesion is 100% stenosed.  Left Circumflex Prox Cx lesion is 40%  stenosed.  Right Coronary Artery Prox RCA lesion is 100% stenosed. Mid RCA lesion is 100% stenosed.  Right Posterior Descending Artery Collaterals RPDA filled by collaterals from Dist LAD.  Right Posterior Atrioventricular Artery Collaterals RPAV filled by collaterals from Dist Cx.  LIMA Graft To Mid LAD  Graft To Ramus Prox Graft lesion is 100% stenosed. The lesion was previously treated.  Graft To RPDA Origin to Prox Graft lesion is 100% stenosed. Prox Graft lesion is 100% stenosed. The lesion was previously treated.  Intervention  No interventions have been documented.   CARDIAC CATHETERIZATION  CARDIAC CATHETERIZATION 12/28/2016  Narrative Table formatting from the original result was not included. Images from the original result were not included.   Prox RCA to Mid RCA lesion, 100 %stenosed.  SVG.  Ost LAD lesion, 100 %stenosed.  Ost Ramus lesion, 100 %stenosed.  LIMA and is normal in caliber.  SVG.  Prox Graft to Mid Graft lesion, 90 %stenosed.  Post intervention, there is a 0% residual stenosis.  A stent was successfully placed.  Mid Graft lesion, 75 %stenosed.  Post intervention, there is a 0% residual stenosis.  A stent was successfully placed.  The left ventricular systolic function is normal.  LV end diastolic pressure is normal.  The left ventricular ejection fraction is 55-65% by visual estimate.  Kevin Webb is a 77 y.o. male   [id number] LOCATION:  FACILITY: [facility] PHYSICIAN: Nanetta Batty, M.D. 03-May-1946   DATE OF PROCEDURE:  12/28/2016  DATE OF DISCHARGE:     CARDIAC CATHETERIZATION/ PCI DES SVG RI & RCA    History obtained from chart review.   77 y.o. male with a history of CAD s/p CABG x4 (2000) and subsequent PCI, HLD, HTN and diet controlled DMT2 who presented to [facility] with chest pain and found to have an elevated POC troponin.He presents now for diagnostic coronary angiography to define his  anatomy.    PROCEDURE DESCRIPTION:  The patient was brought to the second floor [place] [facility]iac cath lab in the postabsorptive state. He was not premedicated . His right groin was prepped and shaved in usual sterile fashion. Xylocaine 1% was used for local anesthesia. A 6 French sheath  was inserted into the right common femoral artery using standard Seldinger technique. 5 French right left Judkins diagnostic catheters along with a 5 [place] RCB catheter and pigtail catheters were used for selective coronary angiography and left ventriculography respectively. Isovue dye was used for the entirety of the case. Retrograde aortic, left ventricular and pullback pressures were recorded.  The patient received Brilenta 180 mg by mouth along with Angiomax bolus followed by infusion with a therapeutic test demonstrated. Using a 6 [place] AL-1 guide catheter along with an 014 Pro water guidewire and a 5 mm failure distal protection device the ramus intermedius branch in-stent restenotic area was crossed with the wire as well as the spider device. The area of stenosis was predilated with a 2.5 by 15 mm balloon and stented with a 3 mm x 20 mm long synergy drug-eluting stent up to 16 atm. It was then postdilated with a 3.25 x 20 mm long balloon up to 16 atm (3.3 mm resulting reduction about 90% "in-stent restenosis to 0% residual. There was a small area of haziness beyond the stented segment which is then was then stented with a 3 mm x 12 mm synergy drug-eluting stent up to 18 atm.  I then used a 6 [place] RCB guide catheter along with the 014 Pro water guidewire and a 5 mm spider distal protection device and crossed the mid RCA SVG lesion. I primarily stented with a 4 mm x 16 mm long Promus premiere drug-eluting stent up to 12 atm (4.1 m) resulting reduction of a 75% fairly focal apical core lesion to 0% residual. The spider device was then withdrawn from the body and completion angiography performed with excellent  flow.  Impression Successful ramus intermedius branch SVG PCI and drug-eluting stenting for "in-stent restenosis using spider distal protection device along with RCA SVG PCI and drug-eluting stenting using distal protection as well with excellent angiographic results. Patient has normal LV function. He tolerated the procedure well. Angiomax will continue 4 hours of focal segmental be discontinued. The patient will be treated with dual antiplatelet therapy. The sheath will be removed and pressure held on the groin to achieve hemostasis. The patient will be hydrated overnight and discharged home in the morning. He left the lab in stable condition.  Nanetta Batty. MD, [facility] 12/28/2016 6:20 PM  Findings Coronary Findings Diagnostic  Dominance: Right  Left Anterior Descending  Ramus Intermedius  Right Coronary Artery  Saphenous Graft To Dist RCA SVG.  LIMA LIMA Graft To Dist LAD LIMA and is normal in caliber.  Saphenous Graft To Ramus SVG. The lesion was previously treated.  Intervention  Mid Graft lesion (Saphenous Graft To Dist RCA) Angioplasty A stent was successfully placed. There is a 0% residual stenosis post intervention.  Prox Graft to Mid Graft lesion (Saphenous Graft To Ramus) Angioplasty A stent was successfully placed. There is a 0% residual stenosis post intervention.   STRESS TESTS  MYOCARDIAL PERFUSION IMAGING 11/14/2019  Narrative  The left ventricular ejection fraction is mildly decreased (45-54%).  Nuclear stress EF: 50%.  There was no ST segment deviation noted during stress.  No T wave inversion was noted during stress.  Defect 1: There is a small defect present in the apical inferior and apical lateral locations.  Findings consistent with prior myocardial infarction. No ischemia.  This is a low risk study.               EKG:  EKG is  ordered today.  The ekg ordered today  demonstrates NSR 64 bpm with stable RBBB  Recent Labs: 11/11/2021:  ALT 17; TSH 1.29 08/05/2022: BUN 5; Creatinine, Ser 0.90; Hemoglobin 14.7; Platelets 162; Potassium 4.0; Sodium 135  Recent Lipid Panel    Component Value Date/Time   CHOL 138 11/11/2021 1004   TRIG 106.0 11/11/2021 1004   HDL 53.90 11/11/2021 1004   CHOLHDL 3 11/11/2021 1004   VLDL 21.2 11/11/2021 1004   LDLCALC 63 11/11/2021 1004   LDLDIRECT 74.0 10/24/2019 0912    Home Medications   Current Meds  Medication Sig   amLODipine (NORVASC) 5 MG tablet Take 1 tablet (5 mg total) by mouth daily.   Ascorbic Acid (VITAMIN C) 1000 MG tablet Take 1,000 mg by mouth daily at 2 am. Reported on 09/26/2015   aspirin EC 81 MG tablet Take 81 mg by mouth daily at 2 am. Swallow whole.   cholecalciferol (VITAMIN D) 25 MCG (1000 UNIT) tablet Take 1,000 Units by mouth daily at 2 am.   clopidogrel (PLAVIX) 75 MG tablet TAKE 1 TABLET BY MOUTH DAILY   Coenzyme Q10 (CO Q-10 PO) Take 1 capsule by mouth daily at 2 am.   dicyclomine (BENTYL) 10 MG capsule Take 1 capsule (10 mg total) by mouth 3 (three) times daily before meals.   dutasteride (AVODART) 0.5 MG capsule Take 0.5 mg by mouth daily.   ezetimibe (ZETIA) 10 MG tablet TAKE 1 TABLET BY MOUTH DAILY   GINKGO BILOBA PO Take 1 capsule by mouth daily at 2 am.    isosorbide mononitrate (IMDUR) 60 MG 24 hr tablet Take 60 mg by mouth daily.   MAGNESIUM PO Take 1 tablet by mouth daily at 2 am.   metoprolol succinate (TOPROL-XL) 25 MG 24 hr tablet Take 1 tablet (25 mg total) by mouth daily.   Multiple Vitamin (MULTIVITAMIN WITH MINERALS) TABS tablet Take 1 tablet by mouth daily at 2 am.   niacin 250 MG tablet Take 250 mg by mouth daily.   nitroGLYCERIN (NITROSTAT) 0.4 MG SL tablet Place 1 tablet (0.4 mg total) under the tongue every 5 (five) minutes x 3 doses as needed.   Omega-3 Fatty Acids (FISH OIL) 1000 MG CAPS Take 2,000 mg by mouth daily at 2 am.    pantoprazole (PROTONIX) 40 MG tablet Take 1 tablet (40 mg total) by mouth daily at 2 am. 30 minutes.    ramipril (ALTACE) 10 MG capsule Take 1 capsule (10 mg total) by mouth daily.   rosuvastatin (CRESTOR) 40 MG tablet TAKE 1 TABLET AT BEDTIME   tamsulosin (FLOMAX) 0.4 MG CAPS capsule Take 0.4 mg by mouth daily at 2 am.    vitamin E 100 UNIT capsule Take 100 Units by mouth daily at 2 am.    Current Facility-Administered Medications for the 10/02/22 encounter (Office Visit) with Alver Sorrow, NP  Medication   sodium chloride flush (NS) 0.9 % injection 3 mL    Review of Systems      All other systems reviewed and are otherwise negative except as noted above.  Physical Exam    VS:  BP (!) 142/70   Pulse 64   Ht 5' 8.5" (1.74 m)   Wt 205 lb (93 kg)   BMI 30.72 kg/m  , BMI Body mass index is 30.72 kg/m.  Wt Readings from Last 3 Encounters:  10/02/22 205 lb (93 kg)  08/07/22 206 lb (93.4 kg)  07/23/22 207 lb 6 oz (94.1 kg)     GEN: Well nourished, well developed, in  no acute distress. HEENT: normal. Neck: Supple, no JVD, carotid bruits, or masses. Cardiac: RRR, no murmurs, rubs, or gallops. No clubbing, cyanosis, edema.  Radials/PT 2+ and equal bilaterally.  Respiratory:  Respirations regular and unlabored, clear to auscultation bilaterally. GI: Soft, nontender, nondistended. MS: No deformity or atrophy. Skin: Warm and dry, no rash. Neuro:  Strength and sensation are intact. Psych: Normal affect.  Assessment & Plan     Chest pain in adult - Mixed typical and atypical features. EKG today NSR no acute ST/T wave changes. Reports as a "discomfort" but not pain. Not as severe as anginal equivalent and occurring at rest. Anticipate musculoskeletal etiology as reports as feeling he need to "stretch it". Discussed possible addition of Ranexa but prefers to continue monitoring. GDMT for CAD, as below. No indication for ischemic evalaution.   CAD s/p CABG and subsequent PCI- GDMT includes Aspirin, Plavix, Metoprolol, Rosuvastatin, Imdur, Zetia, PRN nitroglycerin.  Confirmed by calling  pharmacy that he last picked up Imdur 120 mg tablets earlier this month.  He thought they were 60 mg and will double check his bottle at home.  Heart healthy diet and regular cardiovascular exercise encouraged.    HLD, LDL goal <70 - 10/2021 LDL 63. Continue Rosuvastatin 40mg  QD, Zetia 10mg  QD.  RBBB -stable finding by EKG today.  Continue to monitor periodic EGD.  Would avoid further escalation of AV nodal therapy.  DM2 - 11/07/21 A1c 6.5. Continue to follow with PCP. Not requiring insulin therapy nor oral medications. Managed with diet and exercise.       Disposition: Follow up  as scheduled  with Kevin Rotunda, MD or APP.  Signed, Alver Sorrow, NP 10/02/2022, 1:35 PM [facility]

## 2022-10-02 NOTE — Telephone Encounter (Signed)
Pt called in asking if an order can put in for PSA on the day of his blood work on 6/11??

## 2022-10-02 NOTE — Patient Instructions (Addendum)
Medication Instructions:  Check your pill bottles to make sure your Isosorbide (Imdur) is a 120mg  tablet  Labwork: NONE  Testing/Procedures: NONE  Follow-Up: KEEP AS SCHEDULED WITH DR [facility]

## 2022-10-27 ENCOUNTER — Other Ambulatory Visit (INDEPENDENT_AMBULATORY_CARE_PROVIDER_SITE_OTHER): Payer: Medicare Other

## 2022-10-27 DIAGNOSIS — F1029 Alcohol dependence with unspecified alcohol-induced disorder: Secondary | ICD-10-CM

## 2022-10-27 DIAGNOSIS — Z125 Encounter for screening for malignant neoplasm of prostate: Secondary | ICD-10-CM

## 2022-10-27 DIAGNOSIS — I700 Atherosclerosis of aorta: Secondary | ICD-10-CM | POA: Diagnosis not present

## 2022-10-27 DIAGNOSIS — E559 Vitamin D deficiency, unspecified: Secondary | ICD-10-CM

## 2022-10-27 DIAGNOSIS — E119 Type 2 diabetes mellitus without complications: Secondary | ICD-10-CM | POA: Diagnosis not present

## 2022-10-27 LAB — CBC WITH DIFFERENTIAL/PLATELET
Basophils Absolute: 0 10*3/uL (ref 0.0–0.1)
Basophils Relative: 0.3 % (ref 0.0–3.0)
Eosinophils Absolute: 0.1 10*3/uL (ref 0.0–0.7)
Eosinophils Relative: 1.4 % (ref 0.0–5.0)
HCT: 42 % (ref 39.0–52.0)
Hemoglobin: 13.8 g/dL (ref 13.0–17.0)
Lymphocytes Relative: 21 % (ref 12.0–46.0)
Lymphs Abs: 1.1 10*3/uL (ref 0.7–4.0)
MCHC: 32.8 g/dL (ref 30.0–36.0)
MCV: 92.1 fl (ref 78.0–100.0)
Monocytes Absolute: 0.5 10*3/uL (ref 0.1–1.0)
Monocytes Relative: 9 % (ref 3.0–12.0)
Neutro Abs: 3.7 10*3/uL (ref 1.4–7.7)
Neutrophils Relative %: 68.3 % (ref 43.0–77.0)
Platelets: 175 10*3/uL (ref 150.0–400.0)
RBC: 4.56 Mil/uL (ref 4.22–5.81)
RDW: 15.1 % (ref 11.5–15.5)
WBC: 5.4 10*3/uL (ref 4.0–10.5)

## 2022-10-27 LAB — COMPREHENSIVE METABOLIC PANEL
ALT: 21 U/L (ref 0–53)
AST: 36 U/L (ref 0–37)
Albumin: 3.9 g/dL (ref 3.5–5.2)
Alkaline Phosphatase: 40 U/L (ref 39–117)
BUN: 8 mg/dL (ref 6–23)
CO2: 26 mEq/L (ref 19–32)
Calcium: 8.6 mg/dL (ref 8.4–10.5)
Chloride: 103 mEq/L (ref 96–112)
Creatinine, Ser: 0.87 mg/dL (ref 0.40–1.50)
GFR: 84.78 mL/min (ref >60.00)
Glucose, Bld: 131 mg/dL — ABNORMAL HIGH (ref 70–99)
Potassium: 4.2 mEq/L (ref 3.5–5.1)
Sodium: 136 mEq/L (ref 135–145)
Total Bilirubin: 0.6 mg/dL (ref 0.2–1.2)
Total Protein: 6.3 g/dL (ref 6.0–8.3)

## 2022-10-27 LAB — LIPID PANEL
Cholesterol: 104 mg/dL (ref 0–200)
HDL: 42.2 mg/dL (ref >39.00)
LDL Cholesterol: 36 mg/dL (ref 0–99)
NonHDL: 61.73
Total CHOL/HDL Ratio: 2
Triglycerides: 128 mg/dL (ref 0.0–149.0)
VLDL: 25.6 mg/dL (ref 0.0–40.0)

## 2022-10-27 LAB — HEMOGLOBIN A1C: Hgb A1c MFr Bld: 6.5 % (ref 4.6–6.5)

## 2022-10-27 LAB — PSA, MEDICARE: PSA: 0.7 ng/ml (ref 0.10–4.00)

## 2022-10-28 LAB — VITAMIN B12: Vitamin B-12: 572 pg/mL (ref 211–911)

## 2022-10-28 LAB — VITAMIN D 25 HYDROXY (VIT D DEFICIENCY, FRACTURES): VITD: 64.01 ng/mL (ref 30.00–100.00)

## 2022-10-28 LAB — TSH: TSH: 1.61 u[IU]/mL (ref 0.35–5.50)

## 2022-11-01 LAB — VITAMIN B1: Vitamin B1 (Thiamine): 12 nmol/L (ref 8–30)

## 2022-11-03 ENCOUNTER — Encounter: Payer: Self-pay | Admitting: Family Medicine

## 2022-11-03 ENCOUNTER — Ambulatory Visit (INDEPENDENT_AMBULATORY_CARE_PROVIDER_SITE_OTHER): Payer: Medicare Other | Admitting: Family Medicine

## 2022-11-03 VITALS — BP 130/84 | HR 61 | Temp 97.8°F | Ht 68.5 in | Wt 203.0 lb

## 2022-11-03 DIAGNOSIS — E785 Hyperlipidemia, unspecified: Secondary | ICD-10-CM

## 2022-11-03 DIAGNOSIS — E1159 Type 2 diabetes mellitus with other circulatory complications: Secondary | ICD-10-CM | POA: Diagnosis not present

## 2022-11-03 DIAGNOSIS — I700 Atherosclerosis of aorta: Secondary | ICD-10-CM

## 2022-11-03 DIAGNOSIS — K5792 Diverticulitis of intestine, part unspecified, without perforation or abscess without bleeding: Secondary | ICD-10-CM

## 2022-11-03 DIAGNOSIS — I152 Hypertension secondary to endocrine disorders: Secondary | ICD-10-CM | POA: Diagnosis not present

## 2022-11-03 DIAGNOSIS — F1029 Alcohol dependence with unspecified alcohol-induced disorder: Secondary | ICD-10-CM

## 2022-11-03 DIAGNOSIS — K5790 Diverticulosis of intestine, part unspecified, without perforation or abscess without bleeding: Secondary | ICD-10-CM

## 2022-11-03 DIAGNOSIS — E1169 Type 2 diabetes mellitus with other specified complication: Secondary | ICD-10-CM

## 2022-11-03 DIAGNOSIS — E119 Type 2 diabetes mellitus without complications: Secondary | ICD-10-CM

## 2022-11-03 DIAGNOSIS — I25118 Atherosclerotic heart disease of native coronary artery with other forms of angina pectoris: Secondary | ICD-10-CM

## 2022-11-03 LAB — MICROALBUMIN / CREATININE URINE RATIO
Creatinine,U: 44.1 mg/dL
Microalb Creat Ratio: 1.6 mg/g (ref 0.0–30.0)
Microalb, Ur: <0.7 mg/dL (ref 0.0–1.9)

## 2022-11-03 MED ORDER — METRONIDAZOLE 500 MG PO TABS: 500.0000 mg | ORAL_TABLET | Freq: Three times a day (TID) | ORAL | 0 refills | Status: DC

## 2022-11-03 MED ORDER — CIPROFLOXACIN HCL 500 MG PO TABS: 500.0000 mg | ORAL_TABLET | Freq: Two times a day (BID) | ORAL | 0 refills | Status: DC

## 2022-11-03 NOTE — Progress Notes (Signed)
SUBJECTIVE:   Chief Complaint  Patient presents with   Medical Management of Chronic Issues   HPI Patient presents to clinic for follow up chronic disease management  No acute concerns today  Hypertension/CAD/status post CABG Doing well.  Follows with cardiology and recent had office visit. No changes.  Continues to take amlodipine 5 mg daily, metoprolol 25 mg daily, ramipril 10 mg daily, Imdur 120 mg daily, Plavix 75 mg daily, ASA 81 mg daily and Crestor 40 mg daily.  Has not used nitroglycerin tablets for chest pain on hand and needed.  Denies any visual changes, headaches, chest pain, shortness of breath or lower extremity edema.  Diabetes type 2 Asymptomatic.  Recent A1c 6.5.  Continues to be diet controlled.  Declined foot exam Eye exam due.  Currently on statin and ACEi therapy.  Diverticulitis Patient reports recent flareup of diverticular disease.  Had reached out to GI who prescribed Flagyl and Cipro x 10 days.  Patient reports that eating strawberries which cause flareup.  He is requesting to have prescription on hand in case cannot get to physician.  Discussed with patient limiting use of recurrent antibiotics.     EtOH use. Long-term EtOH, whiskey use.  Declines intervention.  PERTINENT PMH / PSH: DM type II Hypertension BPH CAD/CABG, three-vessel disease TIA without deficits Diverticulosis  OBJECTIVE:  BP 130/84   Pulse 61   Temp 97.8 F (36.6 C)   Ht 5' 8.5" (1.74 m)   Wt 203 lb (92.1 kg)   SpO2 96%   BMI 30.42 kg/m    Physical Exam Vitals reviewed.  Constitutional:      General: He is not in acute distress.    Appearance: Normal appearance. He is normal weight. He is not ill-appearing, toxic-appearing or diaphoretic.  Eyes:     General:        Right eye: No discharge.        Left eye: No discharge.  Cardiovascular:     Rate and Rhythm: Normal rate and regular rhythm.     Heart sounds: Normal heart sounds.  Pulmonary:     Effort: Pulmonary  effort is normal.     Breath sounds: Normal breath sounds.  Abdominal:     General: Bowel sounds are normal.     Palpations: Abdomen is soft.     Tenderness: There is no abdominal tenderness. There is no guarding.  Musculoskeletal:        General: Normal range of motion.     Cervical back: Normal range of motion.  Skin:    General: Skin is warm and dry.  Neurological:     Mental Status: He is alert and oriented to person, place, and time. Mental status is at baseline.  Psychiatric:        Mood and Affect: Mood normal.        Behavior: Behavior normal.        Thought Content: Thought content normal.        Judgment: Judgment normal.     ASSESSMENT/PLAN:  Hypertension associated with diabetes (HCC) Assessment & Plan: Chronic.  Well-controlled per JNC 8 guideline BP at goal less than 140/90 for age. Continue Amlodipine 5 mg daily Continue Altace 10 mg daily Continue metoprolol XL 25 mg daily Follows with cardiology.    Orders: -     Microalbumin / creatinine urine ratio  Hyperlipidemia, unspecified hyperlipidemia type Assessment & Plan: Chronic Continue Crestor 40 mg daily   Diabetic eye exam (HCC) -  Ambulatory referral to Ophthalmology  Aortic atherosclerosis [facility]) Assessment & Plan: Chronic.  Goal LDL less than 50.  Currently on statin.  No myalgias. Continue Crestor 40 mg daily    Coronary artery disease of native artery of native heart with stable angina pectoris [facility]) Assessment & Plan: Doing well.  Asymptomatic.   Continue ASA 81 mg daily and Plavix 75 mg daily Continue Imdur 20 mg daily Continue metoprolol XL 25 mg daily Follows with cardiology   Alcohol dependence with unspecified alcohol-induced disorder (HCC) Assessment & Plan: Chronic.  Not currently interested in cessation. Continue to encourage limited EtOH intake.   Diverticulosis Assessment & Plan: Recently had flare up Will provide prescription for Flagyl and Cipro for recurrent  flare up. Discussed with patient only using with flare and if recurrent will need to be evaluated given high risk of antibiotic resistance and diarrheal infections. Patient agreeable.  Orders: -     Ciprofloxacin HCl; Take 1 tablet (500 mg total) by mouth 2 (two) times daily for 14 days.  Dispense: 28 tablet; Refill: 0 -     metroNIDAZOLE; Take 1 tablet (500 mg total) by mouth 3 (three) times daily for 14 days.  Dispense: 42 tablet; Refill: 0   PDMP reviewed  Return in about 6 months (around 05/05/2023) for PCP.  Dana Allan, MD

## 2022-11-03 NOTE — Patient Instructions (Signed)
It was a pleasure meeting you today. Thank you for allowing me to take part in your health care.  Our goals for today as we discussed include:  Prescription sent for Cipro and Flagyl.  Use as directed when having flare of Diverticulitis  Follow up in 6 months to check a1C   If you have any questions or concerns, please do not hesitate to call the office at [phone].  I look forward to our next visit and until then take care and stay safe.  Regards,   Dana Allan, MD   [facility]

## 2022-11-06 ENCOUNTER — Other Ambulatory Visit: Payer: Self-pay | Admitting: Family Medicine

## 2022-11-06 DIAGNOSIS — E119 Type 2 diabetes mellitus without complications: Secondary | ICD-10-CM

## 2022-11-06 DIAGNOSIS — E785 Hyperlipidemia, unspecified: Secondary | ICD-10-CM

## 2022-11-08 ENCOUNTER — Encounter: Payer: Self-pay | Admitting: Family Medicine

## 2022-11-08 DIAGNOSIS — E119 Type 2 diabetes mellitus without complications: Secondary | ICD-10-CM | POA: Insufficient documentation

## 2022-11-08 NOTE — Assessment & Plan Note (Signed)
Chronic.  Goal LDL less than 50.  Currently on statin.  No myalgias. Continue Crestor 40 mg daily

## 2022-11-08 NOTE — Assessment & Plan Note (Signed)
Chronic.  Well-controlled per JNC 8 guideline BP at goal less than 140/90 for age. Continue Amlodipine 5 mg daily Continue Altace 10 mg daily Continue metoprolol XL 25 mg daily Follows with cardiology.

## 2022-11-08 NOTE — Assessment & Plan Note (Signed)
Chronic.  Not currently interested in cessation. Continue to encourage limited EtOH intake.

## 2022-11-08 NOTE — Assessment & Plan Note (Signed)
Chronic Continue Crestor 40 mg daily 

## 2022-11-08 NOTE — Assessment & Plan Note (Signed)
Doing well.  Asymptomatic.   Continue ASA 81 mg daily and Plavix 75 mg daily Continue Imdur 20 mg daily Continue metoprolol XL 25 mg daily Follows with cardiology

## 2022-11-08 NOTE — Assessment & Plan Note (Signed)
Recently had flare up Will provide prescription for Flagyl and Cipro for recurrent flare up. Discussed with patient only using with flare and if recurrent will need to be evaluated given high risk of antibiotic resistance and diarrheal infections. Patient agreeable.

## 2022-12-14 NOTE — Progress Notes (Unsigned)
  Cardiology Office Note:   Date:  12/15/2022  ID:  Kevin Webb, DOB [birth year], MRN [id number] PCP: Dana Allan, MD  [facility] Providers Cardiologist:  Rollene Rotunda, MD {  History of Present Illness:   Kevin Webb is a 77 y.o. male  who presents for evaluation of CAD.  Cardiac cath in 2021 demonstrated severe three-vessel coronary disease with an occluded SVG to the PDA, occluded SVG to the ramus intermediate.  There was a LIMA to the LAD that was patent.  He was in the emergency room earlier this year and was managed medically for chest pain.  He is otherwise done well.  He is very active in his garden digging and shoveling.  He gets 12,000 steps.  He denies any chest pressure, neck or arm discomfort.  He has no palpitations, presyncope or syncope.  He did have his dose of Imdur increased at the last appointment and he forgot to take it 1 day and had a little discomfort but otherwise has done great.   ROS: As stated in the HPI and negative for all other systems.  Studies Reviewed:    EKG:   NA  Risk Assessment/Calculations:      Physical Exam:   VS:  BP 136/70 (BP Location: Left Arm, Patient Position: Sitting, Cuff Size: Large)   Pulse 66   Ht 5\' 9"  (1.753 m)   Wt 206 lb 12.8 oz (93.8 kg)   SpO2 96%   BMI 30.54 kg/m    Wt Readings from Last 3 Encounters:  12/15/22 206 lb 12.8 oz (93.8 kg)  11/03/22 203 lb (92.1 kg)  10/02/22 205 lb (93 kg)     GEN: Well nourished, well developed in no acute distress NECK: No JVD; No carotid bruits CARDIAC: RRR, no murmurs, rubs, gallops RESPIRATORY:  Clear to auscultation without rales, wheezing or rhonchi  ABDOMEN: Soft, non-tender, non-distended EXTREMITIES:  No edema; No deformity   ASSESSMENT AND PLAN:      CAD s/p CABG and subsequent PCI:   Patient is on directed medical therapy.  He is having no further chest discomfort.  No change in therapy is indicated.  He will continue with risk reduction.   Dyslipidemia:  LDL was 62 with an HDL of 45.  No change in therapy.   RBBB: This has been chronic.  He has no symptomatic bradycardia arrhythmias.  No change in therapy.   DM2:   A1c is 5.7.  Continue meds as listed.        Follow up me in one year.   Signed, Rollene Rotunda, MD

## 2022-12-15 ENCOUNTER — Encounter: Payer: Self-pay | Admitting: Cardiology

## 2022-12-15 ENCOUNTER — Ambulatory Visit: Payer: Medicare Other | Attending: Cardiology | Admitting: Cardiology

## 2022-12-15 VITALS — BP 136/70 | HR 66 | Ht 69.0 in | Wt 206.8 lb

## 2022-12-15 DIAGNOSIS — E785 Hyperlipidemia, unspecified: Secondary | ICD-10-CM | POA: Diagnosis not present

## 2022-12-15 DIAGNOSIS — E118 Type 2 diabetes mellitus with unspecified complications: Secondary | ICD-10-CM | POA: Insufficient documentation

## 2022-12-15 DIAGNOSIS — I152 Hypertension secondary to endocrine disorders: Secondary | ICD-10-CM | POA: Diagnosis not present

## 2022-12-15 DIAGNOSIS — I25119 Atherosclerotic heart disease of native coronary artery with unspecified angina pectoris: Secondary | ICD-10-CM | POA: Diagnosis not present

## 2022-12-15 DIAGNOSIS — E1159 Type 2 diabetes mellitus with other circulatory complications: Secondary | ICD-10-CM | POA: Insufficient documentation

## 2022-12-15 NOTE — Patient Instructions (Signed)

## 2022-12-16 ENCOUNTER — Ambulatory Visit (INDEPENDENT_AMBULATORY_CARE_PROVIDER_SITE_OTHER): Payer: Medicare Other | Admitting: *Deleted

## 2022-12-16 VITALS — BP 135/77 | Ht 68.5 in | Wt 206.0 lb

## 2022-12-16 DIAGNOSIS — Z0000 Encounter for general adult medical examination without abnormal findings: Secondary | ICD-10-CM | POA: Diagnosis not present

## 2022-12-16 NOTE — Progress Notes (Signed)
Subjective:   Kevin Webb is a 77 y.o. male who presents for Medicare Annual/Subsequent preventive examination.  Visit Complete: Virtual  I connected with  Kevin Webb on 12/16/22 by a audio enabled telemedicine application and verified that I am speaking with the correct person using two identifiers.  Patient Location: Home  Provider Location: Office/Clinic  I discussed the limitations of evaluation and management by telemedicine. The patient expressed understanding and agreed to proceed.  Vital Signs: Unable to obtain new vitals due to this being a telehealth visit.  Review of Systems     Cardiac Risk Factors include: advanced age (>11men, >83 women);diabetes mellitus;dyslipidemia;hypertension;male gender;obesity (BMI >30kg/m2);Other (see comment), Risk factor comments: CAD     Objective:    Today's Vitals   12/16/22 1031  BP: 135/77  Weight: 206 lb (93.4 kg)  Height: 5' 8.5" (1.74 m)   Body mass index is 30.87 kg/m.     12/16/2022   10:45 AM 12/01/2021   10:32 AM 11/27/2020    3:56 PM 10/12/2020    7:48 PM 01/20/2020    3:00 AM 12/01/2019    5:58 AM 11/22/2019    1:41 PM  Advanced Directives  Does Patient Have a Medical Advance Directive? Yes Yes Yes No No Yes Yes  Type of Estate agent of [place];Living will Healthcare Power of [place];Living will Healthcare Power of [place];Living will   Healthcare Power of [place];Living will Healthcare Power of [place];Living will  Does patient want to make changes to medical advance directive?  No - Patient declined No - Patient declined   No - Patient declined No - Patient declined  Copy of Healthcare Power of Attorney in Chart? No - copy requested Yes - validated most recent copy scanned in chart (See row information) Yes - validated most recent copy scanned in chart (See row information)   No - copy requested Yes - validated most recent copy scanned in chart (See row information)  Would patient like  information on creating a medical advance directive?    No - Patient declined No - Patient declined      Current Medications (verified) Outpatient Encounter Medications as of 12/16/2022  Medication Sig   amLODipine (NORVASC) 5 MG tablet Take 1 tablet (5 mg total) by mouth daily.   Ascorbic Acid (VITAMIN C) 1000 MG tablet Take 1,000 mg by mouth daily at 2 am. Reported on 09/26/2015   aspirin EC 81 MG tablet Take 81 mg by mouth daily at 2 am. Swallow whole.   cholecalciferol (VITAMIN D) 25 MCG (1000 UNIT) tablet Take 1,000 Units by mouth daily at 2 am.   clopidogrel (PLAVIX) 75 MG tablet TAKE 1 TABLET BY MOUTH DAILY   Coenzyme Q10 (CO Q-10 PO) Take 1 capsule by mouth daily at 2 am.   dicyclomine (BENTYL) 10 MG capsule Take 1 capsule (10 mg total) by mouth 3 (three) times daily before meals.   dutasteride (AVODART) 0.5 MG capsule Take 0.5 mg by mouth daily.   ezetimibe (ZETIA) 10 MG tablet TAKE 1 TABLET BY MOUTH DAILY   GINKGO BILOBA PO Take 1 capsule by mouth daily at 2 am.    isosorbide mononitrate (IMDUR) 120 MG 24 hr tablet Take 1 tablet (120 mg total) by mouth daily.   MAGNESIUM PO Take 1 tablet by mouth daily at 2 am.   metoprolol succinate (TOPROL-XL) 25 MG 24 hr tablet Take 1 tablet (25 mg total) by mouth daily.   Multiple Vitamin (MULTIVITAMIN WITH MINERALS)  TABS tablet Take 1 tablet by mouth daily at 2 am.   niacin 250 MG tablet Take 250 mg by mouth daily.   nitroGLYCERIN (NITROSTAT) 0.4 MG SL tablet Place 1 tablet (0.4 mg total) under the tongue every 5 (five) minutes x 3 doses as needed.   Omega-3 Fatty Acids (FISH OIL) 1000 MG CAPS Take 2,000 mg by mouth daily at 2 am.    pantoprazole (PROTONIX) 40 MG tablet Take 1 tablet (40 mg total) by mouth daily at 2 am. 30 minutes.   ramipril (ALTACE) 10 MG capsule Take 1 capsule (10 mg total) by mouth daily.   rosuvastatin (CRESTOR) 40 MG tablet TAKE 1 TABLET AT BEDTIME   tamsulosin (FLOMAX) 0.4 MG CAPS capsule Take 0.4 mg by mouth daily at 2  am.    vitamin E 100 UNIT capsule Take 100 Units by mouth daily at 2 am.    Facility-Administered Encounter Medications as of 12/16/2022  Medication   sodium chloride flush (NS) 0.9 % injection 3 mL    Allergies (verified) Darvocet [propoxyphene n-acetaminophen] and Propoxyphene   History: Past Medical History:  Diagnosis Date   Acute cholecystitis 04/18/2013   Adenomatous colon polyp 12/2004   Adenomatous colon polyp 12/16/2004   Anemia    Anemia    Blood transfusion without reported diagnosis    BPH associated with nocturia    CAD (coronary artery disease)    a. s/p CABG x4 in 2000 with LIMA-LAD, reverse SVG-IM, reverse SVG-acute margin and reverse SVG-RCA b. s/p BMS to SVG-RI in 2012 c. 12/2016: PCI/DES to SVG-RCA and PCI/DES to SVG-RI (Dr. Antoine Poche)    Carotid artery stenosis    mild    Cataract    Cervical radiculopathy 03/26/2020   Clotting disorder (HCC)    COLONIC POLYPS, HX OF 09/30/2006   Qualifier: Diagnosis of   By: Alwyn Ren MD, William          Polypectomy 2004, 2009; Dr Russella Dar         Coronary atherosclerosis of artery bypass graft 09/30/2006   Qualifier: Diagnosis of   By: Alwyn Ren MD, William       Diabetes mellitus without complication (HCC) 09/28/2016   recent A1C 6.1 12/25/16 controlled with diet and exercise    Diverticulitis 2008/2009   Diverticulitis    Dizziness 04/21/2020   Educated about COVID-19 virus infection 10/06/2018   GERD 02/22/2007   Qualifier: Diagnosis of   By: Alwyn Ren MD, Chrissie Noa       GERD (gastroesophageal reflux disease)    controlled as of 04/01/17    HSV infection    HTN (hypertension)    HTN (hypertension) 09/30/2006   Qualifier: Diagnosis of   By: Alwyn Ren MD, William       Hx of CABG 12/25/2020   Hx of dysplastic nevus 05/04/2018   upper back spinal    Hyperglycemia    Hyperlipidemia    Hyperlipidemia LDL goal <70 01/08/2017   Impingement syndrome of left shoulder 2016   Impingement syndrome of left shoulder 05/18/2014   Kidney  cysts 06/20/2019   Low back pain 02/15/2019   Low testosterone    Dr Evelene Croon, [facility] ,   Myocardial infarction [facility])    NSTEMI (non-ST elevated myocardial infarction) (HCC) 12/25/2016   Osteoarthritis of right knee    With trace effusion   Peyronie disease    Piriformis syndrome of right side 09/22/2019   Prediabetes 03/29/2018   Prostatitis 12/23/2017   Right arm weakness 02/02/2020   RLS (restless legs  syndrome) 08/15/2013   RLS (restless legs syndrome) 08/15/2013   Rotator cuff injury    right; at 25-17 years old.    Sinus bradycardia    Sinus bradycardia    Thrombocytopenia (HCC)    TIA (transient ischemic attack) 01/19/2020   Tubular adenoma of colon 06/26/2020   Type 2 diabetes mellitus without complication, without long-term current use of insulin (HCC) 09/28/2016   Unstable angina (HCC) 12/01/2019   Vitamin D deficiency    Past Surgical History:  Procedure Laterality Date   CHOLECYSTECTOMY N/A 04/21/2013   Procedure: LAPAROSCOPIC CHOLECYSTECTOMY WITH INTRAOPERATIVE CHOLANGIOGRAM;  Surgeon: Currie Paris, MD;  Location: [facility] OR;  Service: General;  Laterality: N/A;   CHOLECYSTECTOMY     COLONOSCOPY W/ POLYPECTOMY  2004 & 2009    X 2; Dr Russella Dar; due 2014   CORONARY ANGIOPLASTY     CORONARY ARTERY BYPASS GRAFT  04/1999   4 vessels   CORONARY STENT INTERVENTION N/A 12/28/2016   Procedure: CORONARY STENT INTERVENTION;  Surgeon: Runell Gess, MD;  Location: [facility] INVASIVE CV LAB;  Service: Cardiovascular;  Laterality: N/A;   CORONARY STENT PLACEMENT  2012   Plavix   LAPAROSCOPIC CHOLECYSTECTOMY  04/21/2013   Dr Jamey Ripa; acutecholecystitis with necrosis   LEFT HEART CATH AND CORS/GRAFTS ANGIOGRAPHY N/A 12/28/2016   Procedure: LEFT HEART CATH AND CORS/GRAFTS ANGIOGRAPHY;  Surgeon: Runell Gess, MD;  Location: [facility] INVASIVE CV LAB;  Service: Cardiovascular;  Laterality: N/A;   LEFT HEART CATH AND CORS/GRAFTS ANGIOGRAPHY N/A 12/01/2019   Procedure: LEFT HEART CATH AND  CORS/GRAFTS ANGIOGRAPHY;  Surgeon: [place], Peter M, MD;  Location: [facility] INVASIVE CV LAB;  Service: Cardiovascular;  Laterality: N/A;   VASECTOMY     Family History  Problem Relation Age of Onset   Coronary artery disease Mother        CABG in 86s   CVA Mother        cns bleed from warfarin   Aneurysm Mother    Aortic aneurysm Father 54       ? abdominal   Heart attack Father 60   Cholecystitis Sister    Diabetes Brother    Diabetes Other        PGaunt   Cancer Neg Hx    Colon cancer Neg Hx    Prostate cancer Neg Hx    Esophageal cancer Neg Hx    Stomach cancer Neg Hx    Rectal cancer Neg Hx    Social History   Socioeconomic History   Marital status: Married    Spouse name: Not on file   Number of children: 1   Years of education: Not on file   Highest education level: Not on file  Occupational History   Occupation:      Comment: plumber  Tobacco Use   Smoking status: Never   Smokeless tobacco: Never  Vaping Use   Vaping status: Never Used  Substance and Sexual Activity   Alcohol use: Yes    Alcohol/week: 10.0 - 12.0 standard drinks of alcohol    Types: 10 - 12 Cans of beer per week    Comment: drinks beer daily; whiskey rarely, used to be daily 1-2 shots   Drug use: No   Sexual activity: Yes    Birth control/protection: None  Other Topics Concern   Not on file  Social History Narrative   Remarried 2014   1 son age 75 as of 03/2017       Lives at home with  spouse   Right handed   Caffeine: 1-2 cups/day (coffee, green tea)   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[place]    Social Determinants of Health   Financial Resource Strain: Low Risk  (12/16/2022)   Overall Financial Resource Strain (CARDIA)    Difficulty of Paying Living Expenses: Not hard at all  Food Insecurity: No Food Insecurity (12/16/2022)   Hunger Vital Sign    Worried About Running Out of Food in the Last Year: Never true    Ran Out of Food in the Last Year: Never true  Transportation Needs: No  Transportation Needs (12/16/2022)   PRAPARE - Administrator, Civil Service (Medical): No    Lack of Transportation (Non-Medical): No  Physical Activity: Inactive (12/16/2022)   Exercise Vital Sign    Days of Exercise per Week: 0 days    Minutes of Exercise per Session: 0 min  Stress: No Stress Concern Present (12/16/2022)   [employer] of Occupational Health - Occupational Stress Questionnaire    Feeling of Stress : Not at all  Social Connections: Socially Integrated (12/16/2022)   Social Connection and Isolation Panel [NHANES]    Frequency of Communication with Friends and Family: More than three times a week    Frequency of Social Gatherings with Friends and Family: More than three times a week    Attends Religious Services: More than 4 times per year    Active Member of [employer] or Organizations: Yes    Attends Engineer, structural: More than 4 times per year    Marital Status: Married    Tobacco Counseling Counseling given: Not Answered   Clinical Intake:  Pre-visit preparation completed: Yes  Pain : No/denies pain     BMI - recorded: 30.87 Nutritional Status: BMI > 30  Obese Nutritional Risks: None Diabetes: Yes CBG done?: No CBG resulted in Enter/ Edit results?: No Did pt. bring in CBG monitor from home?: No  How often do you need to have someone help you when you read instructions, pamphlets, or other written materials from your doctor or pharmacy?: 1 - Never  Interpreter Needed?: No  Comments: R.  LPN   Activities of Daily Living    12/16/2022   10:34 AM  In your present state of health, do you have any difficulty performing the following activities:  Hearing? 0  Vision? 0  Comment glasses  Difficulty concentrating or making decisions? 0  Walking or climbing stairs? 0  Dressing or bathing? 0  Doing errands, shopping? 0  Preparing Food and eating ? N  Using the Toilet? N  In the past six months, have you accidently leaked  urine? N  Do you have problems with loss of bowel control? N  Managing your Medications? N  Managing your Finances? N  Housekeeping or managing your Housekeeping? N    Patient Care Team: Dana Allan, MD as PCP - General (Family Medicine) Rollene Rotunda, MD as PCP - Cardiology (Cardiology) Rollene Rotunda, MD as Consulting Physician (Cardiology) Orson Ape, MD as Consulting Physician (Urology) Meryl Dare, MD as Consulting Physician (Gastroenterology)  Indicate any recent Medical Services you may have received from other than [facility] providers in the past year (date may be approximate).     Assessment:   This is a routine wellness examination for [place].  Hearing/Vision screen Hearing Screening - Comments:: No issues Vision Screening - Comments:: glasses  Dietary issues and exercise activities discussed:     Goals Addressed  This Visit's Progress    Patient Stated       None       Depression Screen    12/16/2022   10:40 AM 11/03/2022   10:34 AM 05/04/2022    1:00 PM 12/01/2021   10:08 AM 10/31/2021    1:15 PM 11/27/2020    3:54 PM 11/22/2019    1:43 PM  PHQ 2/9 Scores  PHQ - 2 Score 0 0 0 0 0 0 0  PHQ- 9 Score 0 0 0        Fall Risk    12/16/2022   10:36 AM 11/03/2022   10:34 AM 05/04/2022    1:00 PM 12/01/2021   10:08 AM 10/31/2021    1:15 PM  Fall Risk   Falls in the past year? 0 0 0 0 0  Number falls in past yr: 1 0 0  0  Injury with Fall? 0 0 0  0  Risk for fall due to : History of fall(s);Impaired balance/gait No Fall Risks No Fall Risks  No Fall Risks  Follow up Falls prevention discussed;Falls evaluation completed Falls evaluation completed Falls evaluation completed Falls evaluation completed Falls evaluation completed    MEDICARE RISK AT HOME:  Medicare Risk at Home - 12/16/22 1037     Any stairs in or around the home? No    If so, are there any without handrails? No    Home free of loose throw rugs in walkways, pet beds,  electrical cords, etc? Yes    Adequate lighting in your home to reduce risk of falls? Yes    Life alert? No    Use of a cane, walker or w/c? No    Grab bars in the bathroom? Yes    Shower chair or bench in shower? Yes    Elevated toilet seat or a handicapped toilet? Yes                Cognitive Function:    11/11/2017   10:21 AM  MMSE - Mini Mental State Exam  Orientation to time 5  Orientation to Place 5  Registration 3  Attention/ Calculation 5  Recall 2  Language- name 2 objects 2  Language- repeat 1  Language- follow 3 step command 3  Language- read & follow direction 1  Write a sentence 1  Copy design 1  Total score 29        12/16/2022   10:45 AM 11/15/2018   10:42 AM  6CIT Screen  What Year? 0 points 0 points  What month? 0 points 0 points  What time? 0 points 0 points  Count back from 20 0 points 0 points  Months in reverse 0 points 0 points  Repeat phrase 2 points 0 points  Total Score 2 points 0 points    Immunizations Immunization History  Administered Date(s) Administered   Fluad Quad(high Dose 65+) 03/26/2020   Influenza, High Dose Seasonal PF 01/25/2015, 03/30/2016, 04/01/2017, 03/29/2018   Influenza,inj,Quad PF,6+ Mos 04/25/2014   Influenza,inj,quad, With Preservative 02/15/2018   Influenza-Unspecified 03/19/2019   Pneumococcal Conjugate-13 09/27/2015   Pneumococcal Polysaccharide-23 04/25/2014   Td 08/07/2008   Tdap 06/26/2020   Zoster, Live 03/21/2014    TDAP status: Up to date  Flu Vaccine status: Due, Education has been provided regarding the importance of this vaccine. Advised may receive this vaccine at local pharmacy or Health Dept. Aware to provide a copy of the vaccination record if obtained from local pharmacy or Health Dept. Verbalized  acceptance and understanding.  Pneumococcal vaccine status: Up to date  Covid-19 vaccine status: Declined, Education has been provided regarding the importance of this vaccine but patient still  declined. Advised may receive this vaccine at local pharmacy or Health Dept.or vaccine clinic. Aware to provide a copy of the vaccination record if obtained from local pharmacy or Health Dept. Verbalized acceptance and understanding.  Qualifies for Shingles Vaccine? Yes   Zostavax completed Yes   Shingrix Completed?: No.    Education has been provided regarding the importance of this vaccine. Patient has been advised to call insurance company to determine out of pocket expense if they have not yet received this vaccine. Advised may also receive vaccine at local pharmacy or Health Dept. Verbalized acceptance and understanding.  Screening Tests Health Maintenance  Topic Date Due   OPHTHALMOLOGY EXAM  12/14/2021   Medicare Annual Wellness (AWV)  12/02/2022   INFLUENZA VACCINE  12/17/2022   HEMOGLOBIN A1C  04/28/2023   Colonoscopy  06/07/2023   Diabetic kidney evaluation - eGFR measurement  10/27/2023   Diabetic kidney evaluation - Urine ACR  11/03/2023   DTaP/Tdap/Td (3 - Td or Tdap) 06/26/2030   Pneumonia Vaccine 33+ Years old  Completed   Hepatitis C Screening  Completed   HPV VACCINES  Aged Out   FOOT EXAM  Discontinued   COVID-19 Vaccine  Discontinued   Zoster Vaccines- Shingrix  Discontinued    Health Maintenance  Health Maintenance Due  Topic Date Due   OPHTHALMOLOGY EXAM  12/14/2021   Medicare Annual Wellness (AWV)  12/02/2022    Colorectal cancer screening: Type of screening: Colonoscopy. Completed 1/22. Repeat every 3 years  Lung Cancer Screening: (Low Dose CT Chest recommended if Age 66-80 years, 20 pack-year currently smoking OR have quit w/in 15years.) does not qualify.    Additional Screening:  Hepatitis C Screening: does qualify; Completed 11/18  Vision Screening: Recommended annual ophthalmology exams for early detection of glaucoma and other disorders of the eye. Is the patient up to date with their annual eye exam?  No  Who is the provider or what is the name  of the office in which the patient attends annual eye exams? [facility] has appointment 8/24 If pt is not established with a provider, would they like to be referred to a provider to establish care? No .   Dental Screening: Recommended annual dental exams for proper oral hygiene  Diabetic Foot Exam: Diabetic Foot Exam: Completed 2/21  Community Resource Referral / Chronic Care Management: CRR required this visit?  No   CCM required this visit?  No     Plan:     I have personally reviewed and noted the following in the patient's chart:   Medical and social history Use of alcohol, tobacco or illicit drugs  Current medications and supplements including opioid prescriptions. Patient is not currently taking opioid prescriptions. Functional ability and status Nutritional status Physical activity Advanced directives List of other physicians Hospitalizations, surgeries, and ER visits in previous 12 months Vitals Screenings to include cognitive, depression, and falls Referrals and appointments  In addition, I have reviewed and discussed with patient certain preventive protocols, quality metrics, and best practice recommendations. A written personalized care plan for preventive services as well as general preventive health recommendations were provided to patient.     Sydell Axon, LPN   08/24/8117   After Visit Summary: (MyChart) Due to this being a telephonic visit, the after visit summary with patients personalized plan was offered  to patient via MyChart   Nurse Notes: None

## 2022-12-16 NOTE — Patient Instructions (Signed)
Kevin Webb , Thank you for taking time to come for your Medicare Wellness Visit. I appreciate your ongoing commitment to your health goals. Please review the following plan we discussed and let me know if I can assist you in the future.   Referrals/Orders/Follow-Ups/Clinician Recommendations: None  This is a list of the screening recommended for you and due dates:  Health Maintenance  Topic Date Due   Eye exam for diabetics  12/14/2021   Flu Shot  12/17/2022   Hemoglobin A1C  04/28/2023   Colon Cancer Screening  06/07/2023   Yearly kidney function blood test for diabetes  10/27/2023   Yearly kidney health urinalysis for diabetes  11/03/2023   Medicare Annual Wellness Visit  12/16/2023   DTaP/Tdap/Td vaccine (3 - Td or Tdap) 06/26/2030   Pneumonia Vaccine  Completed   Hepatitis C Screening  Completed   HPV Vaccine  Aged Out   Complete foot exam   Discontinued   COVID-19 Vaccine  Discontinued   Zoster (Shingles) Vaccine  Discontinued    Advanced directives: (In Chart) A copy of your advanced directives are scanned into your chart should your provider ever need it.  Next Medicare Annual Wellness Visit scheduled for next year: Yes 12/21/23 @ 10:00  Preventive Care 65 Years and Older, Male  Preventive care refers to lifestyle choices and visits with your health care provider that can promote health and wellness. What does preventive care include? A yearly physical exam. This is also called an annual well check. Dental exams once or twice a year. Routine eye exams. Ask your health care provider how often you should have your eyes checked. Personal lifestyle choices, including: Daily care of your teeth and gums. Regular physical activity. Eating a healthy diet. Avoiding tobacco and drug use. Limiting alcohol use. Practicing safe sex. Taking low doses of aspirin every day. Taking vitamin and mineral supplements as recommended by your health care provider. What happens during an  annual well check? The services and screenings done by your health care provider during your annual well check will depend on your age, overall health, lifestyle risk factors, and family history of disease. Counseling  Your health care provider may ask you questions about your: Alcohol use. Tobacco use. Drug use. Emotional well-being. Home and relationship well-being. Sexual activity. Eating habits. History of falls. Memory and ability to understand (cognition). Work and work Astronomer. Screening  You may have the following tests or measurements: Height, weight, and BMI. Blood pressure. Lipid and cholesterol levels. These may be checked every 5 years, or more frequently if you are over 76 years old. Skin check. Lung cancer screening. You may have this screening every year starting at age 58 if you have a 30-pack-year history of smoking and currently smoke or have quit within the past 15 years. Fecal occult blood test (FOBT) of the stool. You may have this test every year starting at age 72. Flexible sigmoidoscopy or colonoscopy. You may have a sigmoidoscopy every 5 years or a colonoscopy every 10 years starting at age 35. Prostate cancer screening. Recommendations will vary depending on your family history and other risks. Hepatitis C blood test. Hepatitis B blood test. Sexually transmitted disease (STD) testing. Diabetes screening. This is done by checking your blood sugar (glucose) after you have not eaten for a while (fasting). You may have this done every 1-3 years. Abdominal aortic aneurysm (AAA) screening. You may need this if you are a current or former smoker. Osteoporosis. You may be screened starting  at age 63 if you are at high risk. Talk with your health care provider about your test results, treatment options, and if necessary, the need for more tests. Vaccines  Your health care provider may recommend certain vaccines, such as: Influenza vaccine. This is recommended  every year. Tetanus, diphtheria, and acellular pertussis (Tdap, Td) vaccine. You may need a Td booster every 10 years. Zoster vaccine. You may need this after age 66. Pneumococcal 13-valent conjugate (PCV13) vaccine. One dose is recommended after age 57. Pneumococcal polysaccharide (PPSV23) vaccine. One dose is recommended after age 61. Talk to your health care provider about which screenings and vaccines you need and how often you need them. This information is not intended to replace advice given to you by your health care provider. Make sure you discuss any questions you have with your health care provider. Document Released: 05/31/2015 Document Revised: 01/22/2016 Document Reviewed: 03/05/2015 Elsevier Interactive Patient Education  2017 [employer].  Fall Prevention in the Home Falls can cause injuries. They can happen to people of all ages. There are many things you can do to make your home safe and to help prevent falls. What can I do on the outside of my home? Regularly fix the edges of walkways and driveways and fix any cracks. Remove anything that might make you trip as you walk through a door, such as a raised step or threshold. Trim any bushes or trees on the path to your home. Use bright outdoor lighting. Clear any walking paths of anything that might make someone trip, such as rocks or tools. Regularly check to see if handrails are loose or broken. Make sure that both sides of any steps have handrails. Any raised decks and porches should have guardrails on the edges. Have any leaves, snow, or ice cleared regularly. Use sand or salt on walking paths during winter. Clean up any spills in your garage right away. This includes oil or grease spills. What can I do in the bathroom? Use night lights. Install grab bars by the toilet and in the tub and shower. Do not use towel bars as grab bars. Use non-skid mats or decals in the tub or shower. If you need to sit down in the shower,  use a plastic, non-slip stool. Keep the floor dry. Clean up any water that spills on the floor as soon as it happens. Remove soap buildup in the tub or shower regularly. Attach bath mats securely with double-sided non-slip rug tape. Do not have throw rugs and other things on the floor that can make you trip. What can I do in the bedroom? Use night lights. Make sure that you have a light by your bed that is easy to reach. Do not use any sheets or blankets that are too big for your bed. They should not hang down onto the floor. Have a firm chair that has side arms. You can use this for support while you get dressed. Do not have throw rugs and other things on the floor that can make you trip. What can I do in the kitchen? Clean up any spills right away. Avoid walking on wet floors. Keep items that you use a lot in easy-to-reach places. If you need to reach something above you, use a strong step stool that has a grab bar. Keep electrical cords out of the way. Do not use floor polish or wax that makes floors slippery. If you must use wax, use non-skid floor wax. Do not have throw rugs  and other things on the floor that can make you trip. What can I do with my stairs? Do not leave any items on the stairs. Make sure that there are handrails on both sides of the stairs and use them. Fix handrails that are broken or loose. Make sure that handrails are as long as the stairways. Check any carpeting to make sure that it is firmly attached to the stairs. Fix any carpet that is loose or worn. Avoid having throw rugs at the top or bottom of the stairs. If you do have throw rugs, attach them to the floor with carpet tape. Make sure that you have a light switch at the top of the stairs and the bottom of the stairs. If you do not have them, ask someone to add them for you. What else can I do to help prevent falls? Wear shoes that: Do not have high heels. Have rubber bottoms. Are comfortable and fit you  well. Are closed at the toe. Do not wear sandals. If you use a stepladder: Make sure that it is fully opened. Do not climb a closed stepladder. Make sure that both sides of the stepladder are locked into place. Ask someone to hold it for you, if possible. Clearly mark and make sure that you can see: Any grab bars or handrails. First and last steps. Where the edge of each step is. Use tools that help you move around (mobility aids) if they are needed. These include: Canes. Walkers. Scooters. Crutches. Turn on the lights when you go into a dark area. Replace any light bulbs as soon as they burn out. Set up your furniture so you have a clear path. Avoid moving your furniture around. If any of your floors are uneven, fix them. If there are any pets around you, be aware of where they are. Review your medicines with your doctor. Some medicines can make you feel dizzy. This can increase your chance of falling. Ask your doctor what other things that you can do to help prevent falls. This information is not intended to replace advice given to you by your health care provider. Make sure you discuss any questions you have with your health care provider. Document Released: 02/28/2009 Document Revised: 10/10/2015 Document Reviewed: 06/08/2014 Elsevier Interactive Patient Education  2017 [employer].

## 2022-12-21 DIAGNOSIS — E113393 Type 2 diabetes mellitus with moderate nonproliferative diabetic retinopathy without macular edema, bilateral: Secondary | ICD-10-CM | POA: Diagnosis not present

## 2022-12-30 ENCOUNTER — Other Ambulatory Visit: Payer: Self-pay

## 2022-12-30 ENCOUNTER — Emergency Department (HOSPITAL_COMMUNITY): Payer: Medicare Other

## 2022-12-30 ENCOUNTER — Emergency Department (HOSPITAL_COMMUNITY)
Admission: EM | Admit: 2022-12-30 | Discharge: 2022-12-30 | Disposition: A | Discharge status: Home / Self Care | Payer: Medicare Other

## 2022-12-30 DIAGNOSIS — Z79899 Other long term (current) drug therapy: Secondary | ICD-10-CM | POA: Diagnosis not present

## 2022-12-30 DIAGNOSIS — E785 Hyperlipidemia, unspecified: Secondary | ICD-10-CM | POA: Diagnosis not present

## 2022-12-30 DIAGNOSIS — R0789 Other chest pain: Secondary | ICD-10-CM | POA: Diagnosis not present

## 2022-12-30 DIAGNOSIS — Z7902 Long term (current) use of antithrombotics/antiplatelets: Secondary | ICD-10-CM | POA: Insufficient documentation

## 2022-12-30 DIAGNOSIS — Z7982 Long term (current) use of aspirin: Secondary | ICD-10-CM | POA: Diagnosis not present

## 2022-12-30 DIAGNOSIS — I2510 Atherosclerotic heart disease of native coronary artery without angina pectoris: Secondary | ICD-10-CM | POA: Diagnosis not present

## 2022-12-30 DIAGNOSIS — R079 Chest pain, unspecified: Secondary | ICD-10-CM | POA: Diagnosis not present

## 2022-12-30 DIAGNOSIS — I10 Essential (primary) hypertension: Secondary | ICD-10-CM | POA: Insufficient documentation

## 2022-12-30 DIAGNOSIS — E119 Type 2 diabetes mellitus without complications: Secondary | ICD-10-CM | POA: Diagnosis not present

## 2022-12-30 DIAGNOSIS — Z955 Presence of coronary angioplasty implant and graft: Secondary | ICD-10-CM | POA: Diagnosis not present

## 2022-12-30 DIAGNOSIS — Z951 Presence of aortocoronary bypass graft: Secondary | ICD-10-CM | POA: Insufficient documentation

## 2022-12-30 LAB — BASIC METABOLIC PANEL
Anion gap: 12 (ref 5–15)
BUN: 10 mg/dL (ref 8–23)
CO2: 23 mmol/L (ref 22–32)
Calcium: 8.7 mg/dL — ABNORMAL LOW (ref 8.9–10.3)
Chloride: 102 mmol/L (ref 98–111)
Creatinine, Ser: 0.97 mg/dL (ref 0.61–1.24)
GFR, Estimated: >60 mL/min (ref >60)
Glucose, Bld: 131 mg/dL — ABNORMAL HIGH (ref 70–99)
Potassium: 4.2 mmol/L (ref 3.5–5.1)
Sodium: 137 mmol/L (ref 135–145)

## 2022-12-30 LAB — TROPONIN I (HIGH SENSITIVITY)
Troponin I (High Sensitivity): 4 ng/L (ref <18)
Troponin I (High Sensitivity): 5 ng/L (ref <18)

## 2022-12-30 LAB — CBC
HCT: 41.1 % (ref 39.0–52.0)
Hemoglobin: 13.7 g/dL (ref 13.0–17.0)
MCH: 30.2 pg (ref 26.0–34.0)
MCHC: 33.3 g/dL (ref 30.0–36.0)
MCV: 90.7 fL (ref 80.0–100.0)
Platelets: 149 10*3/uL — ABNORMAL LOW (ref 150–400)
RBC: 4.53 MIL/uL (ref 4.22–5.81)
RDW: 14.2 % (ref 11.5–15.5)
WBC: 5.5 10*3/uL (ref 4.0–10.5)
nRBC: 0 % (ref 0.0–0.2)

## 2022-12-30 NOTE — ED Triage Notes (Signed)
Pt c/o L intermittent chest pressure since 0500 this am; took 1 time released nitroglycerin at 0545 without relief; took regular nitroglycerin  at 0700, 0725, and 0800 without relief; denies sob, denies diaphoresis; hx CAD, MI, CABG in 2000,

## 2022-12-30 NOTE — Consult Note (Addendum)
Cardiology Consultation   Patient ID: Kevin Webb MRN: [id number]; DOB: 08-07-1945  Admit date: 12/30/2022 Date of Consult: 12/30/2022  PCP:  Dana Allan, MD   [facility] Providers Cardiologist:  Rollene Rotunda, MD   {  Patient Profile:   Kevin Webb is a 77 y.o. male with a hx of CAD status post CABG 2000s with subsequent PCI , carotid stenosis, type 2 diabetes mellitus, hypertension, hyperlipidemia, BPH, right bundle branch block who is being seen 12/30/2022 for the evaluation of chest pain at the request of Dr. Maple Hudson.  History of Present Illness:   [name] is followed by Dr. Antoine Poche for prior CABG x 4 in 2000LIMA-LAD, SVG-OM, SVG-acute marginal, SVG-RCA with subsequent PCI including BMS to SVG-RI in 2012, DES-SVG-RCA and DES to SVG-RI in 2018.  His most recent left heart catheterization was in July 2021 that showed patent LIMA-LAD with occluded SVG graft to both PDA and ramus.  No intervention due to good collateral blood flow to the distal RCA and faint collaterals to the RI.  He was felt to be a poor candidate for PCI to the CTO of the RCA or RI and also not felt suitable for revascularization CABG. Has had intermittent complaints of chest pain this year however previous admission in March 2024 felt more related to musculoskeletal pain.  He was last seen December 15, 2022 and felt to be doing very well overall and had denied any chest pain at that time.EF was last measured in June 2021 off Lexiscan that showed LVEF to be 50%.  Overall was a low risk study.    Patient currently at the emergency room for chest pain that occurred at approximately 430 this morning.  Patient states that he woke up and then started to experience pinpoint localized chest pain without any accompanying symptoms such as diaphoresis, nausea, shortness of breath.  He states that he took his Imdur after this.  Chest pain continued to occur intermittently between 10 and 20 seconds at each..  He later on  he took a nitroglycerin and eventually took up to 3 doses without any relief.  He called the office who had recommended emergency evaluation.  Patient denies any exertional element to this chest pain.  This is the first time that he has had a sensation like this.  This pain is reproducible with palpation and seems to be somewhat positional however not exactly able to replicate positionally.  Patient admits that he could have slept awkwardly and has had neck pain recently.  Additionally patient extremely active and often walks 10,000+ steps in a day and has not noted any decrease in exercise tolerance or shortness of breath.  EKG shows normal sinus rhythm, heart rate 64.  Right bundle branch block.  Occasional PVC.  No acute ST-T wave changes.  Negative chest x-ray.  No other abnormal labs or imaging.   Past Medical History:  Diagnosis Date   Acute cholecystitis 04/18/2013   Adenomatous colon polyp 12/2004   Adenomatous colon polyp 12/16/2004   Anemia    Anemia    Blood transfusion without reported diagnosis    BPH associated with nocturia    CAD (coronary artery disease)    a. s/p CABG x4 in 2000 with LIMA-LAD, reverse SVG-IM, reverse SVG-acute margin and reverse SVG-RCA b. s/p BMS to SVG-RI in 2012 c. 12/2016: PCI/DES to SVG-RCA and PCI/DES to SVG-RI (Dr. Antoine Poche)    Carotid artery stenosis    mild    Cataract  Cervical radiculopathy 03/26/2020   Clotting disorder (HCC)    COLONIC POLYPS, HX OF 09/30/2006   Qualifier: Diagnosis of   By: Alwyn Ren MD, William          Polypectomy 2004, 2009; Dr Russella Dar         Coronary atherosclerosis of artery bypass graft 09/30/2006   Qualifier: Diagnosis of   By: Alwyn Ren MD, Chrissie Noa       Diabetes mellitus without complication (HCC) 09/28/2016   recent A1C 6.1 12/25/16 controlled with diet and exercise    Diverticulitis 2008/2009   Diverticulitis    Dizziness 04/21/2020   Educated about COVID-19 virus infection 10/06/2018   GERD 02/22/2007   Qualifier:  Diagnosis of   By: Alwyn Ren MD, Chrissie Noa       GERD (gastroesophageal reflux disease)    controlled as of 04/01/17    HSV infection    HTN (hypertension)    HTN (hypertension) 09/30/2006   Qualifier: Diagnosis of   By: Alwyn Ren MD, William       Hx of CABG 12/25/2020   Hx of dysplastic nevus 05/04/2018   upper back spinal    Hyperglycemia    Hyperlipidemia    Hyperlipidemia LDL goal <70 01/08/2017   Impingement syndrome of left shoulder 2016   Impingement syndrome of left shoulder 05/18/2014   Kidney cysts 06/20/2019   Low back pain 02/15/2019   Low testosterone    Dr Evelene Croon, [facility] ,[place]   Myocardial infarction [facility])    NSTEMI (non-ST elevated myocardial infarction) (HCC) 12/25/2016   Osteoarthritis of right knee    With trace effusion   Peyronie disease    Piriformis syndrome of right side 09/22/2019   Prediabetes 03/29/2018   Prostatitis 12/23/2017   Right arm weakness 02/02/2020   RLS (restless legs syndrome) 08/15/2013   RLS (restless legs syndrome) 08/15/2013   Rotator cuff injury    right; at 18-75 years old.    Sinus bradycardia    Sinus bradycardia    Thrombocytopenia (HCC)    TIA (transient ischemic attack) 01/19/2020   Tubular adenoma of colon 06/26/2020   Type 2 diabetes mellitus without complication, without long-term current use of insulin (HCC) 09/28/2016   Unstable angina (HCC) 12/01/2019   Vitamin D deficiency     Past Surgical History:  Procedure Laterality Date   CHOLECYSTECTOMY N/A 04/21/2013   Procedure: LAPAROSCOPIC CHOLECYSTECTOMY WITH INTRAOPERATIVE CHOLANGIOGRAM;  Surgeon: Currie Paris, MD;  Location: [facility] OR;  Service: General;  Laterality: N/A;   CHOLECYSTECTOMY     COLONOSCOPY W/ POLYPECTOMY  2004 & 2009    X 2; Dr Russella Dar; due 2014   CORONARY ANGIOPLASTY     CORONARY ARTERY BYPASS GRAFT  04/1999   4 vessels   CORONARY STENT INTERVENTION N/A 12/28/2016   Procedure: CORONARY STENT INTERVENTION;  Surgeon: Runell Gess, MD;  Location: [facility]  INVASIVE CV LAB;  Service: Cardiovascular;  Laterality: N/A;   CORONARY STENT PLACEMENT  2012   Plavix   LAPAROSCOPIC CHOLECYSTECTOMY  04/21/2013   Dr Jamey Ripa; acutecholecystitis with necrosis   LEFT HEART CATH AND CORS/GRAFTS ANGIOGRAPHY N/A 12/28/2016   Procedure: LEFT HEART CATH AND CORS/GRAFTS ANGIOGRAPHY;  Surgeon: Runell Gess, MD;  Location: [facility] INVASIVE CV LAB;  Service: Cardiovascular;  Laterality: N/A;   LEFT HEART CATH AND CORS/GRAFTS ANGIOGRAPHY N/A 12/01/2019   Procedure: LEFT HEART CATH AND CORS/GRAFTS ANGIOGRAPHY;  Surgeon: Swaziland, Peter M, MD;  Location: [facility] INVASIVE CV LAB;  Service: Cardiovascular;  Laterality: N/A;   VASECTOMY  Inpatient Medications: Scheduled Meds:  sodium chloride flush  3 mL Intravenous Q12H   Continuous Infusions:  PRN Meds:   Allergies:    Allergies  Allergen Reactions   Darvocet [Propoxyphene N-Acetaminophen] Hives   Propoxyphene Hives    Social History:   Social History   Socioeconomic History   Marital status: Married    Spouse name: Not on file   Number of children: 1   Years of education: Not on file   Highest education level: Not on file  Occupational History   Occupation:      Comment: plumber  Tobacco Use   Smoking status: Never   Smokeless tobacco: Never  Vaping Use   Vaping status: Never Used  Substance and Sexual Activity   Alcohol use: Yes    Alcohol/week: 10.0 - 12.0 standard drinks of alcohol    Types: 10 - 12 Cans of beer per week    Comment: drinks beer daily; whiskey rarely, used to be daily 1-2 shots   Drug use: No   Sexual activity: Yes    Birth control/protection: None  Other Topics Concern   Not on file  Social History Narrative   Remarried 2014   1 son age 85 as of 03/2017       Lives at home with spouse   Right handed   Caffeine: 1-2 cups/day (coffee, green tea)   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[place]    Social Determinants of Health   Financial Resource Strain: Low Risk  (12/16/2022)    Overall Financial Resource Strain (CARDIA)    Difficulty of Paying Living Expenses: Not hard at all  Food Insecurity: No Food Insecurity (12/16/2022)   Hunger Vital Sign    Worried About Running Out of Food in the Last Year: Never true    Ran Out of Food in the Last Year: Never true  Transportation Needs: No Transportation Needs (12/16/2022)   PRAPARE - Administrator, Civil Service (Medical): No    Lack of Transportation (Non-Medical): No  Physical Activity: Inactive (12/16/2022)   Exercise Vital Sign    Days of Exercise per Week: 0 days    Minutes of Exercise per Session: 0 min  Stress: No Stress Concern Present (12/16/2022)   [employer] of Occupational Health - Occupational Stress Questionnaire    Feeling of Stress : Not at all  Social Connections: Socially Integrated (12/16/2022)   Social Connection and Isolation Panel [NHANES]    Frequency of Communication with Friends and Family: More than three times a week    Frequency of Social Gatherings with Friends and Family: More than three times a week    Attends Religious Services: More than 4 times per year    Active Member of [employer] or Organizations: Yes    Attends Engineer, structural: More than 4 times per year    Marital Status: Married  Catering manager Violence: Not At Risk (12/16/2022)   Humiliation, Afraid, Rape, and Kick questionnaire    Fear of Current or Ex-Partner: No    Emotionally Abused: No    Physically Abused: No    Sexually Abused: No    Family History:   Family History  Problem Relation Age of Onset   Coronary artery disease Mother        CABG in 29s   CVA Mother        cns bleed from warfarin   Aneurysm Mother    Aortic aneurysm Father 17       ?  abdominal   Heart attack Father 50   Cholecystitis Sister    Diabetes Brother    Diabetes Other        PGaunt   Cancer Neg Hx    Colon cancer Neg Hx    Prostate cancer Neg Hx    Esophageal cancer Neg Hx    Stomach cancer Neg Hx     Rectal cancer Neg Hx      ROS:  Please see the history of present illness.  All other ROS reviewed and negative.     Physical Exam/Data:   Vitals:   12/30/22 0930 12/30/22 0945 12/30/22 1045 12/30/22 1130  BP: 127/65 (!) 141/80 126/70 121/68  Pulse: 62 61 60 (!) 59  Resp: 16 16 20 18   Temp:      TempSrc:      SpO2: 98% 97% 98% 98%   No intake or output data in the 24 hours ending 12/30/22 1205    12/16/2022   10:31 AM 12/15/2022    1:59 PM 11/03/2022   10:33 AM  Last 3 Weights  Weight (lbs) 206 lb 206 lb 12.8 oz 203 lb  Weight (kg) 93.441 kg 93.804 kg 92.08 kg     There is no height or weight on file to calculate BMI.  General:  Well nourished, well developed, in no acute distress HEENT: normal Neck: no JVD Vascular: No carotid bruits; Distal pulses 2+ bilaterally Cardiac:  normal S1, S2; RRR; no murmur  Lungs:  clear to auscultation bilaterally, no wheezing, rhonchi or rales  Abd: soft, nontender, no hepatomegaly  Ext: no edema Musculoskeletal:  No deformities, BUE and BLE strength normal and equal Skin: warm and dry  Neuro:  CNs 2-12 intact, no focal abnormalities noted Psych:  Normal affect   EKG:  The EKG was personally reviewed and demonstrates:  EKG shows normal sinus rhythm, heart rate 64.  Right bundle branch block.  Occasional PVC.  No acute ST-T wave changes.  Telemetry:  Telemetry was personally reviewed and demonstrates: Normal sinus rhythm heart rate in the 60s-70s  Relevant CV Studies: Left heart catheterization 12/01/2019 Ost LAD to Prox LAD lesion is 100% stenosed. 1st Diag lesion is 60% stenosed. Ramus lesion is 100% stenosed. Prox Cx lesion is 40% stenosed. Prox RCA lesion is 100% stenosed. Mid RCA lesion is 100% stenosed. Prox Graft lesion is 100% stenosed. Prox Graft lesion is 100% stenosed. Origin to Prox Graft lesion is 100% stenosed. The left ventricular systolic function is normal. LV end diastolic pressure is normal. The left ventricular  ejection fraction is 55-65% by visual estimate.   1. Severe 3 vessel occlusive CAD involving the ostial LAD, ostial ramus intermediate and proximal RCA. 2. Occluded SVG to PDA 3. Occluded SVG to ramus intermediate 4. Patent LIMA to the LAD.  5. Normal LV function 6. Normal LVEDP   Plan: would recommend medical management. There are good collaterals to the distal RCA. Very faint collaterals to the ramus intermediate. He is a poor candidate for CTO PCI of the RCA with ambiguous proximal cap and severe diffuse disease in the mid vessel based on old films- I suspect it is occluded in the mid vessel as well now. The ramus is also not a candidate for CTO PCI and is not a suitable target for redo CABG. Will add Imdur 30 mg daily.   Laboratory Data:  High Sensitivity Troponin:   Recent Labs  Lab 12/30/22 0835 12/30/22 1010  TROPONINIHS 4 5     Chemistry  Recent Labs  Lab 12/30/22 0835  NA 137  K 4.2  CL 102  CO2 23  GLUCOSE 131*  BUN 10  CREATININE 0.97  CALCIUM 8.7*  GFRNONAA >60  ANIONGAP 12    No results for input(s): "PROT", "ALBUMIN", "AST", "ALT", "ALKPHOS", "BILITOT" in the last 168 hours. Lipids No results for input(s): "CHOL", "TRIG", "HDL", "LABVLDL", "LDLCALC", "CHOLHDL" in the last 168 hours.  Hematology Recent Labs  Lab 12/30/22 0835  WBC 5.5  RBC 4.53  HGB 13.7  HCT 41.1  MCV 90.7  MCH 30.2  MCHC 33.3  RDW 14.2  PLT 149*   Thyroid No results for input(s): "TSH", "FREET4" in the last 168 hours.  BNPNo results for input(s): "BNP", "PROBNP" in the last 168 hours.  DDimer No results for input(s): "DDIMER" in the last 168 hours.   Radiology/Studies:  DG Chest 2 View  Result Date: 12/30/2022 CLINICAL DATA:  cp EXAM: CHEST - 2 VIEW COMPARISON:  CXR 10/12/20 FINDINGS: No pleural effusion. No pneumothorax. Status post median sternotomy and CABG. Cardiac mediastinal contours are unchanged. No focal airspace opacity. No radiographically apparent displaced rib  fractures. Visualized upper abdomen is unremarkable. Vertebral body heights are maintained. IMPRESSION: No focal airspace opacity Electronically Signed   By: Lorenza Cambridge M.D.   On: 12/30/2022 09:12     Assessment and Plan:   Chest pain Highly suspicious for musculoskeletal pain as this pain is nonexertional, reproducible, at times positional, occurring for the first time, no accompanied sxs.  EKG without any ischemic changes.  Troponins negative x 2.   Continue to treat per primary team  CAD s/p CABG x 4 in 2000 (LIMA-LAD, SVG-OM, SVG-acute marginal, SVG-RCA with subsequent PCI including BMS to SVG-RI in 2012, DES-SVG-RCA and DES to SVG-RI in 2018) Has known multivessel disease based off cardiac catheterization in July 2021. Has occluded SVG graft to both PDA and ramus.  No intervention due to good collateral blood flow to the distal RCA and faint collaterals to the RI.  He was felt to be a poor candidate for PCI to the CTO of the RCA or RI and also not felt suitable for revascularization CABG. continue to medically manage Continue aspirin and Plavix, rosuvastatin 40 mg, Imdur 120 mg, Zetia 10 mg, Toprol-XL 25 mg  Hyperlipidemia Continue rosuvastatin 40 mg and Zetia    Risk Assessment/Risk Scores:    For questions or updates, please contact [facility] Please consult [email or web address] for contact info under    Signed, Abagail Kitchens, PA-C  12/30/2022 12:05 PM    Patient seen and examined. Agree with assessment and plan.  Mr. Brix Hajek is a very pleasant 77 year old gentleman who is followed by Dr. Antoine Poche.  Established CAD in 2000 underwent CBG revascularization surgery with a LIMA to his LAD, SVG to OM, SVG to acute marginal, and SVG to RCA.  At his most recent cardiac catheterization in 2021 Dr. Swaziland he was found to have a patent LIMA to his LAD and occluded stented vein grafts which had supplied ramus intermediate vessel and RCA.  He had good collateralization to the  distal RCA circumflex and LAD.  Native RCA was occluded as was his proximal circumflex.  He has been on medical therapy and has continued to do well.  He remains active and typically walks in excess of 10,000 steps per day.  This Sunday, he was lifting boards and plywood.  This morning, he had noticed new onset left-sided chest discomfort.  This was nonexertional.  He is mimicked with palpation.  It was unrelieved with sublingual nitroglycerin.  Upon presentation to the emergency room, he had stable hemodynamics.  ECG is normal showed sinus rhythm at 64 bpm with isolated PVC, right bundle branch block and left anterior hemiblock.  Dynamics are stable with previous blood pressure 121/68.  Pulse has been around 60.  HEENT is unremarkable.  There is no JVD or carotid bruits.  Lungs were clear.  He had palpable chest wall tenderness over the left costochondral region.  Rhythm was regular there was no S3 gallop.  Abdomen was nontender.  Pulses were 2+.  There was no edema.  From a cardiac standpoint, he is stable.  His chest pain is musculoskeletal in etiology.  Labs are normal.  Troponins are negative.  Recommend continue aggressive lipid-lowering therapy with rosuvastatin/Zetia, and continue long-term DAPT, isosorbide, and Toprol-XL.  Okay for discharge from ER today.  Patient will follow-up with Dr. Antoine Poche, his primary cardiologist.  Lennette Bihari, MD, [facility] 12/30/2022 3:50 PM

## 2022-12-30 NOTE — ED Provider Notes (Signed)
[facility] AT [facility] Provider Note   CSN: [id number] Arrival date & time: 12/30/22  6213     History  Chief Complaint  Patient presents with   Chest Pain    Kevin Webb is a 77 y.o. male.  History of CAD with multivessel disease presenting emergency department chest pain.  Abrupt onset this morning at 5 AM.  It is left-sided.  Deep pressure.  No nausea no vomiting.  Took aspirin and nitro at home.  Little relief.  Reports not having any provocative testing in the past 2 years.  Saw his cardiologist a couple weeks ago with clean bill of health.   Chest Pain      Home Medications Prior to Admission medications   Medication Sig Start Date End Date Taking? Authorizing Provider  amLODipine (NORVASC) 5 MG tablet Take 1 tablet (5 mg total) by mouth daily. 09/15/22   Dana Allan, MD  Ascorbic Acid (VITAMIN C) 1000 MG tablet Take 1,000 mg by mouth daily at 2 am. Reported on 09/26/2015    [provider]  aspirin EC 81 MG tablet Take 81 mg by mouth daily at 2 am. Swallow whole.    [provider]  cholecalciferol (VITAMIN D) 25 MCG (1000 UNIT) tablet Take 1,000 Units by mouth daily at 2 am.    [provider]  clopidogrel (PLAVIX) 75 MG tablet TAKE 1 TABLET BY MOUTH DAILY 11/06/22   Dana Allan, MD  Coenzyme Q10 (CO Q-10 PO) Take 1 capsule by mouth daily at 2 am.    [provider]  dicyclomine (BENTYL) 10 MG capsule Take 1 capsule (10 mg total) by mouth 3 (three) times daily before meals. 08/31/22   Meryl Dare, MD  dutasteride (AVODART) 0.5 MG capsule Take 0.5 mg by mouth daily. 02/14/22   [provider]  ezetimibe (ZETIA) 10 MG tablet TAKE 1 TABLET BY MOUTH DAILY 11/06/22   Dana Allan, MD  [facility] BILOBA PO Take 1 capsule by mouth daily at 2 am.     [provider]  isosorbide mononitrate (IMDUR) 120 MG 24 hr tablet Take 1 tablet (120 mg total) by mouth daily. 10/02/22   Alver Sorrow, NP   MAGNESIUM PO Take 1 tablet by mouth daily at 2 am.    [provider]  metoprolol succinate (TOPROL-XL) 25 MG 24 hr tablet Take 1 tablet (25 mg total) by mouth daily. 07/06/22 07/01/23  Dana Allan, MD  Multiple Vitamin (MULTIVITAMIN WITH MINERALS) TABS tablet Take 1 tablet by mouth daily at 2 am.    [provider]  niacin 250 MG tablet Take 250 mg by mouth daily.    [provider]  nitroGLYCERIN (NITROSTAT) 0.4 MG SL tablet Place 1 tablet (0.4 mg total) under the tongue every 5 (five) minutes x 3 doses as needed. 10/28/21   Rollene Rotunda, MD  Omega-3 Fatty Acids (FISH OIL) 1000 MG CAPS Take 2,000 mg by mouth daily at 2 am.     [provider]  pantoprazole (PROTONIX) 40 MG tablet Take 1 tablet (40 mg total) by mouth daily at 2 am. 30 minutes. 10/31/21   McLean-Scocuzza, Pasty Spillers, MD  ramipril (ALTACE) 10 MG capsule Take 1 capsule (10 mg total) by mouth daily. 09/15/22   Dana Allan, MD  rosuvastatin (CRESTOR) 40 MG tablet TAKE 1 TABLET AT BEDTIME 11/06/22   Dana Allan, MD  tamsulosin (FLOMAX) 0.4 MG CAPS capsule Take 0.4 mg by mouth daily at  2 am.     [provider]  vitamin E 100 UNIT capsule Take 100 Units by mouth daily at 2 am.     [provider]      Allergies    Darvocet [propoxyphene n-acetaminophen] and Propoxyphene    Review of Systems   Review of Systems  Cardiovascular:  Positive for chest pain.    Physical Exam Updated Vital Signs BP 112/83   Pulse (!) 57   Temp 97.8 F (36.6 C) (Oral)   Resp 19   SpO2 97%  Physical Exam Vitals and nursing note reviewed.  Constitutional:      General: He is not in acute distress.    Appearance: He is not toxic-appearing.  HENT:     Head: Normocephalic.  Cardiovascular:     Rate and Rhythm: Normal rate and regular rhythm.     Heart sounds: Normal heart sounds.  Pulmonary:     Effort: Pulmonary effort is normal.     Breath sounds: Normal breath sounds.  Chest:     Chest  wall: Tenderness present.  Musculoskeletal:     Right lower leg: No edema.     Left lower leg: No edema.  Neurological:     General: No focal deficit present.     Mental Status: He is alert.  Psychiatric:        Mood and Affect: Mood normal.        Behavior: Behavior normal.     ED Results / Procedures / Treatments   Labs (all labs ordered are listed, but only abnormal results are displayed) Labs Reviewed  BASIC METABOLIC PANEL - Abnormal; Notable for the following components:      Result Value   Glucose, Bld 131 (*)    Calcium 8.7 (*)    All other components within normal limits  CBC - Abnormal; Notable for the following components:   Platelets 149 (*)    All other components within normal limits  TROPONIN I (HIGH SENSITIVITY)  TROPONIN I (HIGH SENSITIVITY)    EKG EKG Interpretation Date/Time:  Wednesday December 30 2022 08:07:53 EDT Ventricular Rate:  64 PR Interval:  128 QRS Duration:  150 QT Interval:  464 QTC Calculation: 478 R Axis:   -69  Text Interpretation: Sinus rhythm with Premature supraventricular complexes Left axis deviation Right bundle branch block Abnormal ECG When compared with ECG of 05-Aug-2022 14:37, PREVIOUS ECG IS PRESENT Confirmed by Estanislado Pandy [phone]) on 12/30/2022 9:23:32 AM  Radiology DG Chest 2 View  Result Date: 12/30/2022 CLINICAL DATA:  cp EXAM: CHEST - 2 VIEW COMPARISON:  CXR 10/12/20 FINDINGS: No pleural effusion. No pneumothorax. Status post median sternotomy and CABG. Cardiac mediastinal contours are unchanged. No focal airspace opacity. No radiographically apparent displaced rib fractures. Visualized upper abdomen is unremarkable. Vertebral body heights are maintained. IMPRESSION: No focal airspace opacity Electronically Signed   By: Lorenza Cambridge M.D.   On: 12/30/2022 09:12    Procedures Procedures    Medications Ordered in ED Medications - No data to display  ED Course/ Medical Decision Making/ A&P Clinical Course as of  12/30/22 1555  Wed Dec 30, 2022  0909 Per chart review, Saw cardiologist Dr. Antoine Poche late last month and per their note :"Cardiac cath in 2021 demonstrated severe three-vessel coronary disease with an occluded SVG to the PDA, occluded SVG to the ramus intermediate.  There was a LIMA to the LAD that was patent.  " [TY]  1126 Spoke with Cardiology, will see patient.  [  TY]    Clinical Course User Index [TY] Coral Spikes, DO                                 Medical Decision Making This is a 77 year old male with significant cardiac history presenting the emergency department for left-sided chest pain.  He is afebrile nontachycardic hemodynamically stable.  Physical exam with recent producible chest pain to the left chest wall.  EKG without ST segment changes acute ischemia.  Troponin negative x 2.  No significant metabolic derangements.  Normal kidney function.  No leukocytosis to suggest systemic infection.  Chest x-ray without acute pathology.  Given patient's past medical history, discuss case with cardiology.  They saw evaluated patient recommending discharge for likely costochondritis.  Patient stable for discharge at this time.  Amount and/or Complexity of Data Reviewed Independent Historian: spouse    Details: Reports patient is quite active External Data Reviewed: notes.    Details: See ED course Labs: ordered. Radiology: ordered.  Risk Decision regarding hospitalization. Risk Details: Negative workup and cardiology recommending outpatient workup.  Will discharge at this time          Final Clinical Impression(s) / ED Diagnoses Final diagnoses:  None    Rx / DC Orders ED Discharge Orders     None         Coral Spikes, DO 12/30/22 1555

## 2022-12-30 NOTE — Discharge Instructions (Signed)
As discussed, please follow-up with your primary doctor and cardiology as soon as possible.  Return to emergency department medially if develop fevers, chills, worsening chest pain, shortness of breath, passout or any new or worsening symptoms that are concerning to you.

## 2022-12-30 NOTE — ED Notes (Signed)
Patient transported to X-ray 

## 2023-01-06 ENCOUNTER — Telehealth: Payer: Self-pay

## 2023-01-06 NOTE — Telephone Encounter (Signed)
Transition Care Management Follow-up Telephone Call Date of discharge and from where: 12/30/2022 [facility] How have you been since you were released from the hospital? Patient stated he is feeling ok, getting better. Any questions or concerns? No  Items Reviewed: Did the pt receive and understand the discharge instructions provided? Yes  Medications obtained and verified?  No medication prescribed Other? No  Any new allergies since your discharge? No  Dietary orders reviewed? Yes Do you have support at home? Yes   Follow up appointments reviewed:  PCP Hospital f/u appt confirmed? No  Scheduled to see  on  @ . Specialist Hospital f/u appt confirmed? No  Scheduled to see  on  @ . Are transportation arrangements needed? No  If their condition worsens, is the pt aware to call PCP or go to the Emergency Dept.? Yes Was the patient provided with contact information for the PCP's office or ED? Yes Was to pt encouraged to call back with questions or concerns? Yes  Avenly Roberge [facility]  [facility] Population Health Community Resource Care Guide   [email or web address]  ?? [phone]   Website: [email or web address]

## 2023-01-20 ENCOUNTER — Ambulatory Visit (INDEPENDENT_AMBULATORY_CARE_PROVIDER_SITE_OTHER): Payer: Medicare Other | Admitting: Urology

## 2023-01-20 VITALS — Ht 68.5 in | Wt 203.5 lb

## 2023-01-20 DIAGNOSIS — N400 Enlarged prostate without lower urinary tract symptoms: Secondary | ICD-10-CM | POA: Diagnosis not present

## 2023-01-20 DIAGNOSIS — E291 Testicular hypofunction: Secondary | ICD-10-CM

## 2023-01-20 DIAGNOSIS — N529 Male erectile dysfunction, unspecified: Secondary | ICD-10-CM | POA: Diagnosis not present

## 2023-01-20 NOTE — Progress Notes (Signed)
Marcelle Overlie Plume,acting as a scribe for Vanna Scotland, MD.,have documented all relevant documentation on the behalf of Vanna Scotland, MD,as directed by  Vanna Scotland, MD while in the presence of Vanna Scotland, MD.  01/20/2023 9:43 AM   [name] 1945/08/10 [id number]  Referring provider: Dana Allan, MD [address] [place],  [place] [address]  Chief Complaint  Patient presents with   Benign Prostatic Hypertrophy    HPI: 77 year-old male who returns today for a 6 month follow up. He has personal history of BPH and hypogonadism.   For BPH, he is managed on dutasteride and Flomax. His most recent PSA on 10/27/2022 was 0.7.   His hypogonadism previously managed with testosterone cream. He had been using this only intermittently. There is some question or concern if he was physically fit enough for strenuous activity, including sexual activity. I had reached out to Dr. Jenene Slicker office but never heard back.   He has also had a chronic left UPJ obstruction managed conservatively with preserved renal function.   Today, he reports frequent urination but no difficulty in voiding.  He takes Imdur on a daily basis.  He continues to have recurrent episodes of pain in his chest, most recently felt to be musculoskeletal.   PMH: Past Medical History:  Diagnosis Date   Acute cholecystitis 04/18/2013   Adenomatous colon polyp 12/2004   Adenomatous colon polyp 12/16/2004   Anemia    Anemia    Blood transfusion without reported diagnosis    BPH associated with nocturia    CAD (coronary artery disease)    a. s/p CABG x4 in 2000 with LIMA-LAD, reverse SVG-IM, reverse SVG-acute margin and reverse SVG-RCA b. s/p BMS to SVG-RI in 2012 c. 12/2016: PCI/DES to SVG-RCA and PCI/DES to SVG-RI (Dr. Antoine Poche)    Carotid artery stenosis    mild    Cataract    Cervical radiculopathy 03/26/2020   Clotting disorder (HCC)    COLONIC POLYPS, HX OF 09/30/2006   Qualifier: Diagnosis of   By:  Alwyn Ren MD, William          Polypectomy 2004, 2009; Dr Russella Dar         Coronary atherosclerosis of artery bypass graft 09/30/2006   Qualifier: Diagnosis of   By: Alwyn Ren MD, William       Diabetes mellitus without complication (HCC) 09/28/2016   recent A1C 6.1 12/25/16 controlled with diet and exercise    Diverticulitis 2008/2009   Diverticulitis    Dizziness 04/21/2020   Educated about COVID-19 virus infection 10/06/2018   GERD 02/22/2007   Qualifier: Diagnosis of   By: Alwyn Ren MD, Chrissie Noa       GERD (gastroesophageal reflux disease)    controlled as of 04/01/17    HSV infection    HTN (hypertension)    HTN (hypertension) 09/30/2006   Qualifier: Diagnosis of   By: Alwyn Ren MD, William       Hx of CABG 12/25/2020   Hx of dysplastic nevus 05/04/2018   upper back spinal    Hyperglycemia    Hyperlipidemia    Hyperlipidemia LDL goal <70 01/08/2017   Impingement syndrome of left shoulder 2016   Impingement syndrome of left shoulder 05/18/2014   Kidney cysts 06/20/2019   Low back pain 02/15/2019   Low testosterone    Dr Evelene Croon, [place] ,[place]   Myocardial infarction [facility])    NSTEMI (non-ST elevated myocardial infarction) (HCC) 12/25/2016   Osteoarthritis of right knee    With trace effusion  Peyronie disease    Piriformis syndrome of right side 09/22/2019   Prediabetes 03/29/2018   Prostatitis 12/23/2017   Right arm weakness 02/02/2020   RLS (restless legs syndrome) 08/15/2013   RLS (restless legs syndrome) 08/15/2013   Rotator cuff injury    right; at 6-74 years old.    Sinus bradycardia    Sinus bradycardia    Thrombocytopenia (HCC)    TIA (transient ischemic attack) 01/19/2020   Tubular adenoma of colon 06/26/2020   Type 2 diabetes mellitus without complication, without long-term current use of insulin (HCC) 09/28/2016   Unstable angina (HCC) 12/01/2019   Vitamin D deficiency     Surgical History: Past Surgical History:  Procedure Laterality Date   CHOLECYSTECTOMY N/A  04/21/2013   Procedure: LAPAROSCOPIC CHOLECYSTECTOMY WITH INTRAOPERATIVE CHOLANGIOGRAM;  Surgeon: Currie Paris, MD;  Location: [facility] OR;  Service: General;  Laterality: N/A;   CHOLECYSTECTOMY     COLONOSCOPY W/ POLYPECTOMY  2004 & 2009    X 2; Dr Russella Dar; due 2014   CORONARY ANGIOPLASTY     CORONARY ARTERY BYPASS GRAFT  04/1999   4 vessels   CORONARY STENT INTERVENTION N/A 12/28/2016   Procedure: CORONARY STENT INTERVENTION;  Surgeon: Runell Gess, MD;  Location: [facility] INVASIVE CV LAB;  Service: Cardiovascular;  Laterality: N/A;   CORONARY STENT PLACEMENT  2012   Plavix   LAPAROSCOPIC CHOLECYSTECTOMY  04/21/2013   Dr Jamey Ripa; acutecholecystitis with necrosis   LEFT HEART CATH AND CORS/GRAFTS ANGIOGRAPHY N/A 12/28/2016   Procedure: LEFT HEART CATH AND CORS/GRAFTS ANGIOGRAPHY;  Surgeon: Runell Gess, MD;  Location: [facility] INVASIVE CV LAB;  Service: Cardiovascular;  Laterality: N/A;   LEFT HEART CATH AND CORS/GRAFTS ANGIOGRAPHY N/A 12/01/2019   Procedure: LEFT HEART CATH AND CORS/GRAFTS ANGIOGRAPHY;  Surgeon: [place]az[place], Peter M, MD;  Location: [facility] INVASIVE CV LAB;  Service: Cardiovascular;  Laterality: N/A;   VASECTOMY      Home Medications:  Allergies as of 01/20/2023       Reactions   Darvocet [propoxyphene N-acetaminophen] Hives   Propoxyphene Hives        Medication List        Accurate as of January 20, 2023  9:43 AM. If you have any questions, ask your nurse or doctor.          amLODipine 5 MG tablet Commonly known as: NORVASC Take 1 tablet (5 mg total) by mouth daily.   aspirin EC 81 MG tablet Take 81 mg by mouth daily at 2 am. Swallow whole.   cholecalciferol 25 MCG (1000 UNIT) tablet Commonly known as: VITAMIN D3 Take 1,000 Units by mouth daily at 2 am.   clopidogrel 75 MG tablet Commonly known as: PLAVIX TAKE 1 TABLET BY MOUTH DAILY   CO Q-10 PO Take 1 capsule by mouth daily at 2 am.   dicyclomine 10 MG capsule Commonly known as: BENTYL Take 1 capsule (10  mg total) by mouth 3 (three) times daily before meals.   dutasteride 0.5 MG capsule Commonly known as: AVODART Take 0.5 mg by mouth daily.   ezetimibe 10 MG tablet Commonly known as: ZETIA TAKE 1 TABLET BY MOUTH DAILY   Fish Oil 1000 MG Caps Take 2,000 mg by mouth daily at 2 am.   [facility] BILOBA PO Take 1 capsule by mouth daily at 2 am.   isosorbide mononitrate 120 MG 24 hr tablet Commonly known as: IMDUR Take 1 tablet (120 mg total) by mouth daily.   MAGNESIUM PO Take 1 tablet  by mouth daily at 2 am.   metoprolol succinate 25 MG 24 hr tablet Commonly known as: TOPROL-XL Take 1 tablet (25 mg total) by mouth daily.   multivitamin with minerals Tabs tablet Take 1 tablet by mouth daily at 2 am.   niacin 250 MG tablet Commonly known as: VITAMIN B3 Take 250 mg by mouth daily.   nitroGLYCERIN 0.4 MG SL tablet Commonly known as: NITROSTAT Place 1 tablet (0.4 mg total) under the tongue every 5 (five) minutes x 3 doses as needed.   pantoprazole 40 MG tablet Commonly known as: PROTONIX Take 1 tablet (40 mg total) by mouth daily at 2 am. 30 minutes.   ramipril 10 MG capsule Commonly known as: ALTACE Take 1 capsule (10 mg total) by mouth daily.   rosuvastatin 40 MG tablet Commonly known as: CRESTOR TAKE 1 TABLET AT BEDTIME   tamsulosin 0.4 MG Caps capsule Commonly known as: FLOMAX Take 0.4 mg by mouth daily at 2 am.   vitamin C 1000 MG tablet Take 1,000 mg by mouth daily at 2 am. Reported on 09/26/2015   vitamin E 45 MG (100 UNITS) capsule Take 100 Units by mouth daily at 2 am.        Allergies:  Allergies  Allergen Reactions   Darvocet [Propoxyphene N-Acetaminophen] Hives   Propoxyphene Hives    Family History: Family History  Problem Relation Age of Onset   Coronary artery disease Mother        CABG in 67s   CVA Mother        cns bleed from warfarin   Aneurysm Mother    Aortic aneurysm Father 81       ? abdominal   Heart attack Father 27    Cholecystitis Sister    Diabetes Brother    Diabetes Other        PGaunt   Cancer Neg Hx    Colon cancer Neg Hx    Prostate cancer Neg Hx    Esophageal cancer Neg Hx    Stomach cancer Neg Hx    Rectal cancer Neg Hx     Social History:  reports that he has never smoked. He has never used smokeless tobacco. He reports current alcohol use of about 10.0 - 12.0 standard drinks of alcohol per week. He reports that he does not use drugs.   Physical Exam: Ht 5' 8.5" (1.74 m)   Wt 203 lb 8 oz (92.3 kg)   BMI 30.49 kg/m   Constitutional:  Alert and oriented, No acute distress. HEENT: [place] AT, moist mucus membranes.  Trachea midline, no masses. Neurologic: Grossly intact, no focal deficits, moving all 4 extremities. Psychiatric: Normal mood and affect.   Assessment & Plan:    1. BPH - Continue Flomax and dutasteride - PSA deferred today due to recent PSA with PCP - Monitor PSA annually  2. Hypogonadism - Declined to prescribe additional testosterone at this time. His most recent testosterone last year was normal -Overall, he thinks he is doing fine off of it and agrees with the plan to continue  3. Erectile dysfunction - He is not a candidate for PDE-5 inhibitors. Still have not received word back from his cardiologist.  - Discussed potential for intracavernosal injections; at this time he is not motivated to pursue this.  Diffuse, will need to have cardiac clearance for this.  Return in about 1 year (around 01/20/2024) for PA visit with IPSS, PVR, and PSA.  I have reviewed the above documentation  for accuracy and completeness, and I agree with the above.   Vanna Scotland, MD   [facility] [address], [address] [place], [place] [address] [phone]

## 2023-02-01 ENCOUNTER — Other Ambulatory Visit: Payer: Self-pay | Admitting: Family Medicine

## 2023-02-01 DIAGNOSIS — K219 Gastro-esophageal reflux disease without esophagitis: Secondary | ICD-10-CM

## 2023-02-25 ENCOUNTER — Telehealth: Payer: Self-pay | Admitting: Family Medicine

## 2023-02-25 NOTE — Telephone Encounter (Signed)
Patient came in and wants to get a refill on his medication. The name is Cipro. It's not on his medication list. He asked could he get a refill on it. The pharmacy he uses is [facility] - [place], [place] - [address] [place], [place] [address] Phone: [phone]  Fax: [phone]  His number is [phone]

## 2023-03-01 ENCOUNTER — Encounter: Payer: Self-pay | Admitting: Family Medicine

## 2023-03-01 DIAGNOSIS — K5790 Diverticulosis of intestine, part unspecified, without perforation or abscess without bleeding: Secondary | ICD-10-CM

## 2023-03-01 NOTE — Telephone Encounter (Signed)
Pt sent another mychart with same request. I have sent him the response below to his mychart.

## 2023-03-02 MED ORDER — CIPROFLOXACIN HCL 500 MG PO TABS: 500.0000 mg | ORAL_TABLET | Freq: Two times a day (BID) | ORAL | 3 refills | Status: DC

## 2023-03-02 MED ORDER — METRONIDAZOLE 500 MG PO TABS
500.0000 mg | ORAL_TABLET | Freq: Three times a day (TID) | ORAL | 3 refills | Status: DC
Start: 2023-03-02 — End: 2023-04-06

## 2023-03-02 NOTE — Telephone Encounter (Addendum)
Patient returned call to office and wanted to know if Dr. [name] received message because she was the last one to refill Cipro and Flagyl to have on hand for diverticulitis flares advised patient I would speak with provider to see if willing to refill for diverticulitis flares and return call to him. Spoke with PCP and PCP stated for him she will refill medications for diverticulitis flares due to he travels and needs for when out  of town and follow regimen and does not over use. Patient would need to keep appointments and see GI regularly.  Received verbal to send Flagyl and Cipro same dose with 3 to 4 refill sent 3 refills.

## 2023-03-02 NOTE — Addendum Note (Signed)
Addended by: Dennie Bible on: 03/02/2023 10:40 AM   Modules accepted: Orders

## 2023-04-06 ENCOUNTER — Telehealth: Payer: Self-pay

## 2023-04-06 ENCOUNTER — Encounter: Payer: Self-pay | Admitting: Gastroenterology

## 2023-04-06 ENCOUNTER — Ambulatory Visit (INDEPENDENT_AMBULATORY_CARE_PROVIDER_SITE_OTHER): Payer: Medicare Other | Admitting: Gastroenterology

## 2023-04-06 VITALS — BP 120/60 | HR 64 | Ht 69.0 in | Wt 205.0 lb

## 2023-04-06 DIAGNOSIS — K219 Gastro-esophageal reflux disease without esophagitis: Secondary | ICD-10-CM | POA: Diagnosis not present

## 2023-04-06 DIAGNOSIS — K5790 Diverticulosis of intestine, part unspecified, without perforation or abscess without bleeding: Secondary | ICD-10-CM

## 2023-04-06 DIAGNOSIS — R1032 Left lower quadrant pain: Secondary | ICD-10-CM | POA: Diagnosis not present

## 2023-04-06 DIAGNOSIS — Z860101 Personal history of adenomatous and serrated colon polyps: Secondary | ICD-10-CM

## 2023-04-06 MED ORDER — DICYCLOMINE HCL 10 MG PO CAPS: 10.0000 mg | ORAL_CAPSULE | Freq: Three times a day (TID) | ORAL | 3 refills | Status: AC

## 2023-04-06 MED ORDER — NA SULFATE-K SULFATE-MG SULF 17.5-3.13-1.6 GM/177ML PO SOLN: 1.0000 | Freq: Once | ORAL | 0 refills | Status: AC

## 2023-04-06 MED ORDER — METRONIDAZOLE 500 MG PO TABS: 500.0000 mg | ORAL_TABLET | Freq: Two times a day (BID) | ORAL | 0 refills | Status: AC

## 2023-04-06 MED ORDER — CIPROFLOXACIN HCL 500 MG PO TABS: 500.0000 mg | ORAL_TABLET | Freq: Two times a day (BID) | ORAL | 0 refills | Status: AC

## 2023-04-06 MED ORDER — PANTOPRAZOLE SODIUM 40 MG PO TBEC: 40.0000 mg | DELAYED_RELEASE_TABLET | Freq: Every day | ORAL | 3 refills | Status: AC

## 2023-04-06 NOTE — Progress Notes (Signed)
Assessment     Mild LLQ pain, L groin pain, L testicular pain. Likely GU related however cannot exclude diverticulitis GERD, well controlled Personal history of adenomatous colon polyps, last colonoscopy with 3 small tubular adenomas, due for surveillance CAD, S/P CABG on Plavix   Recommendations    Cipro 500 mg bid, Flagyl 500 mg bid for 10 days Follow up with Urology as planned Continue dicyclomine 10 mg tid prn Continue pantoprazole 40 mg po qd, follow antireflux measures  High fiber diet, adequate daily water intake. Schedule surveillance colonoscopy. The risks (including bleeding, perforation, infection, missed lesions, medication reactions and possible hospitalization or surgery if complications occur), benefits, and alternatives to colonoscopy with possible biopsy and possible polypectomy were discussed with the patient and they consent to proceed.    Hold Plavix 5 days before procedure - will instruct when and how to resume after procedure. Low but real risk of cardiovascular event such as heart attack, stroke, embolism, thrombosis or ischemia/infarct of other organs off Plavix explained and need to seek urgent help if this occurs. The patient consents to proceed. Will communicate by phone or EMR with patient's prescribing provider to confirm that holding Plavix is reasonable in this case.     HPI    This is a 77 year old male with a history of LLQ pain, L groin pain, recurrent diverticulitis.  He has similar complaints has in the past with left lower quadrant pain, left groin pain, left testicular pain.  He continues to follow with urology.  He feels that course of antibiotics for diverticulitis at times has improved his left lower quadrant pain and left groin pain.  He notes worsening left lower quadrant pain and left groin pain for the past several days.  He took 2 days worth of Cipro and Flagyl without a change in symptoms.  He is due for surveillance colonoscopy.   Labs /  Imaging       Latest Ref Rng & Units 10/27/2022    9:06 AM 11/11/2021   10:04 AM 07/10/2021    9:58 AM  Hepatic Function  Total Protein 6.0 - 8.3 g/dL 6.3  6.3  7.1   Albumin 3.5 - 5.2 g/dL 3.9  4.1  4.5   AST 0 - 37 U/L 36  22  28   ALT 0 - 53 U/L 21  17  20    Alk Phosphatase 39 - 117 U/L 40  46  46   Total Bilirubin 0.2 - 1.2 mg/dL 0.6  1.0  1.0        Latest Ref Rng & Units 12/30/2022    8:35 AM 10/27/2022    9:06 AM 08/05/2022    2:40 PM  CBC  WBC 4.0 - 10.5 K/uL 5.5  5.4  6.7   Hemoglobin 13.0 - 17.0 g/dL 13.0  86.5  78.4   Hematocrit 39.0 - 52.0 % 41.1  42.0  42.9   Platelets 150 - 400 K/uL 149  175.0  162     Current Medications, Allergies, Past Medical History, Past Surgical History, Family History and Social History were reviewed in [employer] record.   Physical Exam: General: Well developed, well nourished, no acute distress Head: Normocephalic and atraumatic Eyes: Sclerae anicteric, EOMI Ears: Normal auditory acuity Mouth: No deformities or lesions noted Lungs: Clear throughout to auscultation Heart: Regular rate and rhythm; No murmurs, rubs or bruits Abdomen: Soft, mild L groin/LLQ tendernss and non distended. No masses, hepatosplenomegaly or hernias noted.  Normal Bowel sounds Rectal: Deferred to colonoscopy  Musculoskeletal: Symmetrical with no gross deformities  Pulses:  Normal pulses noted Extremities: No edema or deformities noted Neurological: Alert oriented x 4, grossly nonfocal Psychological:  Alert and cooperative. Normal mood and affect   Kevin Keesey T. Russella Dar, MD 04/06/2023, 9:27 AM

## 2023-04-06 NOTE — Patient Instructions (Addendum)
We have sent the following medications to your pharmacy for you to pick up at your convenience: Cipro, Flagyl, pantoprazole, dicyclomine and Suprep.  You have been scheduled for a colonoscopy. Please follow written instructions given to you at your visit today.   Please pick up your prep supplies at the pharmacy within the next 1-3 days.  If you use inhalers (even only as needed), please bring them with you on the day of your procedure.  DO NOT TAKE 7 DAYS PRIOR TO TEST- Trulicity (dulaglutide) Ozempic, Wegovy (semaglutide) Mounjaro (tirzepatide) Bydureon Bcise (exanatide extended release)  DO NOT TAKE 1 DAY PRIOR TO YOUR TEST Rybelsus (semaglutide) Adlyxin (lixisenatide) Victoza (liraglutide) Byetta (exanatide) ___________________________________________________________________________ Due to recent changes in healthcare laws, you may see the results of your imaging and laboratory studies on MyChart before your provider has had a chance to review them.  We understand that in some cases there may be results that are confusing or concerning to you. Not all laboratory results come back in the same time frame and the provider may be waiting for multiple results in order to interpret others.  Please give [place] 48 hours in order for your provider to thoroughly review all the results before contacting the office for clarification of your results.   The [facility] providers would like to encourage you to use [facility] to communicate with providers for non-urgent requests or questions.  Due to long hold times on the telephone, sending your provider a message by [facility] may be a faster and more efficient way to get a response.  Please allow 48 business hours for a response.  Please remember that this is for non-urgent requests.   Thank you for choosing me and [facility].  Venita Lick. Pleas Koch., MD., Clementeen Graham

## 2023-04-06 NOTE — Telephone Encounter (Signed)
  KINDLE CARVER Jan 09, 1946 [id number]  @DATE @   Dear Dr. [name]:  We have scheduled the above named patient for a(n) colonoscopy procedure. Our records show that (s)he is on anticoagulation therapy.  Please advise as to whether the patient may come off their therapy of Plavix 5 days prior to their procedure which is scheduled for 04/28/23.  Please route your response to Jovita Kussmaul, CMA.  Sincerely,    [facility]

## 2023-04-07 NOTE — Telephone Encounter (Signed)
See below, Kevin Allan, MD wants to cardiology to decide Plavix clearance. Please advise.

## 2023-04-07 NOTE — Telephone Encounter (Signed)
Cardiology team has been notified.

## 2023-04-07 NOTE — Telephone Encounter (Signed)
[facility] Pre-operative Risk Assessment     Request for surgical clearance:     Endoscopy Procedure  What type of surgery is being performed?     colonoscopy  When is this surgery scheduled?     04/28/23  What type of clearance is required ?   Pharmacy  Are there any medications that need to be held prior to surgery and how long? Plavix x 5 days  Practice name and name of physician performing surgery?      [facility]  What is your office phone and fax number?      Phone- [phone]  Fax- [phone]  Anesthesia type (None, local, MAC, general) ?       MAC   Please route your response to Jovita Kussmaul, CMA

## 2023-04-12 NOTE — Telephone Encounter (Signed)
   Patient Name: Kevin Webb  DOB: 1946-03-10 MRN: [id number]  Primary Cardiologist: Rollene Rotunda, MD  Chart reviewed as part of pre-operative protocol coverage. Pre-op clearance already addressed by colleagues in earlier phone notes. To summarize recommendations:  - OK to hold Plavix but continue ASA.  -Dr. Antoine Poche   Will route this bundled recommendation to requesting provider via Epic fax function and remove from pre-op pool. Please call with questions.  Sharlene Dory, PA-C 04/12/2023, 8:52 AM

## 2023-04-12 NOTE — Telephone Encounter (Signed)
I spoke with Kevin Webb and informed him it is okay to hold his plavix 5 days prior to his procedure. He verbalized understanding. He will continue his ASA.

## 2023-04-28 ENCOUNTER — Encounter: Payer: Self-pay | Admitting: Gastroenterology

## 2023-04-28 ENCOUNTER — Ambulatory Visit: Payer: Medicare Other | Admitting: Gastroenterology

## 2023-04-28 VITALS — BP 98/68 | HR 64 | Temp 97.9°F | Resp 14 | Ht 69.0 in | Wt 205.0 lb

## 2023-04-28 DIAGNOSIS — K640 First degree hemorrhoids: Secondary | ICD-10-CM

## 2023-04-28 DIAGNOSIS — Z860101 Personal history of adenomatous and serrated colon polyps: Secondary | ICD-10-CM

## 2023-04-28 DIAGNOSIS — Z1211 Encounter for screening for malignant neoplasm of colon: Secondary | ICD-10-CM | POA: Diagnosis not present

## 2023-04-28 DIAGNOSIS — D125 Benign neoplasm of sigmoid colon: Secondary | ICD-10-CM | POA: Diagnosis not present

## 2023-04-28 DIAGNOSIS — K5730 Diverticulosis of large intestine without perforation or abscess without bleeding: Secondary | ICD-10-CM | POA: Diagnosis not present

## 2023-04-28 MED ORDER — SODIUM CHLORIDE 0.9 % IV SOLN: 500.0000 mL | INTRAVENOUS | Status: DC

## 2023-04-28 NOTE — Op Note (Signed)
[facility] Patient Name: Kevin Webb Procedure Date: 04/28/2023 9:45 AM MRN: [id number] Endoscopist: Meryl Dare , MD, [phone] Age: 77 Referring MD:  Date of Birth: 1946/04/11 Gender: Male Account #: [id number] Procedure:                Colonoscopy Indications:              Surveillance: Personal history of adenomatous                            polyps on last colonoscopy 3 years ago Medicines:                Monitored Anesthesia Care Procedure:                Pre-Anesthesia Assessment:                           - Prior to the procedure, a History and Physical                            was performed, and patient medications and                            allergies were reviewed. The patient's tolerance of                            previous anesthesia was also reviewed. The risks                            and benefits of the procedure and the sedation                            options and risks were discussed with the patient.                            All questions were answered, and informed consent                            was obtained. Prior Anticoagulants: The patient has                            taken Plavix (clopidogrel), last dose was 5 days                            prior to procedure. ASA Grade Assessment: III - A                            patient with severe systemic disease. After                            reviewing the risks and benefits, the patient was                            deemed in satisfactory condition to undergo the  procedure.                           After obtaining informed consent, the colonoscope                            was passed under direct vision. Throughout the                            procedure, the patient's blood pressure, pulse, and                            oxygen saturations were monitored continuously. The                            Olympus Scope SN [id number] was introduced through  the                            anus and advanced to the the cecum, identified by                            appendiceal orifice and ileocecal valve. The                            ileocecal valve, appendiceal orifice, and rectum                            were photographed. The quality of the bowel                            preparation was adequate. The colonoscopy was                            performed without difficulty. The patient tolerated                            the procedure well. Scope In: 9:56:51 AM Scope Out: 10:11:30 AM Scope Withdrawal Time: 0 hours 12 minutes 40 seconds  Total Procedure Duration: 0 hours 14 minutes 39 seconds  Findings:                 The perianal and digital rectal examinations were                            normal.                           A 10 mm polyp was found in the sigmoid colon. The                            polyp was sessile. The polyp was removed with a                            cold snare. Resection and retrieval were complete.  Multiple medium-mouthed and small-mouthed                            diverticula were found in the left colon. There was                            no evidence of diverticular bleeding.                           Internal hemorrhoids were found during                            retroflexion. The hemorrhoids were small and Grade                            I (internal hemorrhoids that do not prolapse).                           The exam was otherwise without abnormality on                            direct and retroflexion views. Complications:            No immediate complications. Estimated blood loss:                            None. Estimated Blood Loss:     Estimated blood loss: none. Impression:               - One 10 mm polyp in the sigmoid colon, removed                            with a cold snare. Resected and retrieved.                           - Moderate diverticulosis in the  left colon.                           - Internal hemorrhoids.                           - The examination was otherwise normal on direct                            and retroflexion views. Recommendation:           - Repeat colonoscopy after studies are complete for                            surveillance based on pathology results.                           - Resume Plavix (clopidogrel) in 2 days at prior                            dose. Refer to managing physician for further  adjustment of therapy.                           - Patient has a contact number available for                            emergencies. The signs and symptoms of potential                            delayed complications were discussed with the                            patient. Return to normal activities tomorrow.                            Written discharge instructions were provided to the                            patient.                           - Resume previous diet adding high fiber.                           - Continue present medications.                           - Await pathology results. Meryl Dare, MD 04/28/2023 10:17:59 AM This report has been signed electronically.

## 2023-04-28 NOTE — Progress Notes (Signed)
Pt's states no medical or surgical changes since previsit or office visit. 

## 2023-04-28 NOTE — Progress Notes (Signed)
Called to room to assist during endoscopic procedure.  Patient ID and intended procedure confirmed with present staff. Received instructions for my participation in the procedure from the performing physician.  

## 2023-04-28 NOTE — Progress Notes (Signed)
See 04/06/2023 H&P no changes

## 2023-04-28 NOTE — Patient Instructions (Signed)
Resume previous diet Continue present medications Await pathology results Handouts/ information given for polyps, diverticulosis and hemorrhoids  YOU HAD AN ENDOSCOPIC PROCEDURE TODAY AT THE [facility]:   Refer to the procedure report that was given to you for any specific questions about what was found during the examination.  If the procedure report does not answer your questions, please call your gastroenterologist to clarify.  If you requested that your care partner not be given the details of your procedure findings, then the procedure report has been included in a sealed envelope for you to review at your convenience later.  YOU SHOULD EXPECT: Some feelings of bloating in the abdomen. Passage of more gas than usual.  Walking can help get rid of the air that was put into your GI tract during the procedure and reduce the bloating. If you had a lower endoscopy (such as a colonoscopy or flexible sigmoidoscopy) you may notice spotting of blood in your stool or on the toilet paper. If you underwent a bowel prep for your procedure, you may not have a normal bowel movement for a few days.  Please Note:  You might notice some irritation and congestion in your nose or some drainage.  This is from the oxygen used during your procedure.  There is no need for concern and it should clear up in a day or so.  SYMPTOMS TO REPORT IMMEDIATELY:  Following lower endoscopy (colonoscopy or flexible sigmoidoscopy):  Excessive amounts of blood in the stool  Significant tenderness or worsening of abdominal pains  Swelling of the abdomen that is new, acute  Fever of 100F or higher  For urgent or emergent issues, a gastroenterologist can be reached at any hour by calling [phone]. Do not use MyChart messaging for urgent concerns.    DIET:  We do recommend a small meal at first, but then you may proceed to your regular diet.  Drink plenty of fluids but you should avoid alcoholic beverages for 24  hours.  ACTIVITY:  You should plan to take it easy for the rest of today and you should NOT DRIVE or use heavy machinery until tomorrow (because of the sedation medicines used during the test).    FOLLOW UP: Our staff will call the number listed on your records the next business day following your procedure.  We will call around 7:15- 8:00 am to check on you and address any questions or concerns that you may have regarding the information given to you following your procedure. If we do not reach you, we will leave a message.     If any biopsies were taken you will be contacted by phone or by letter within the next 1-3 weeks.  Please call us at [phone] if you have not heard about the biopsies in 3 weeks.    SIGNATURES/CONFIDENTIALITY: You and/or your care partner have signed paperwork which will be entered into your electronic medical record.  These signatures attest to the fact that that the information above on your After Visit Summary has been reviewed and is understood.  Full responsibility of the confidentiality of this discharge information lies with you and/or your care-partner. 

## 2023-04-29 ENCOUNTER — Telehealth: Payer: Self-pay

## 2023-04-29 NOTE — Telephone Encounter (Signed)
  Follow up Call-     04/28/2023    9:12 AM  Call back number  Post procedure Call Back phone  # [phone]  Permission to leave phone message Yes     Patient questions:  Do you have a fever, pain , or abdominal swelling? No. Pain Score  0 *  Have you tolerated food without any problems? Yes.    Have you been able to return to your normal activities? Yes.    Do you have any questions about your discharge instructions: Diet   No. Medications  No. Follow up visit  No.  Do you have questions or concerns about your Care? No.  Actions: * If pain score is 4 or above: No action needed, pain <4.

## 2023-04-30 LAB — SURGICAL PATHOLOGY

## 2023-05-03 ENCOUNTER — Telehealth: Payer: Self-pay | Admitting: Gastroenterology

## 2023-05-03 MED ORDER — METRONIDAZOLE 500 MG PO TABS: 500.0000 mg | ORAL_TABLET | Freq: Two times a day (BID) | ORAL | 0 refills | Status: DC

## 2023-05-03 MED ORDER — CIPROFLOXACIN HCL 500 MG PO TABS: 500.0000 mg | ORAL_TABLET | Freq: Two times a day (BID) | ORAL | 0 refills | Status: DC

## 2023-05-03 NOTE — Telephone Encounter (Signed)
PT is going out of town this weekend and wants to have a prescription for Cipro and flagyll sent in for him. Please advise.

## 2023-05-03 NOTE — Telephone Encounter (Signed)
Informed patient that I will send Cipro and Flagyl to his pharmacy. Patient verbalized understanding.

## 2023-05-03 NOTE — Telephone Encounter (Signed)
Patient states he is leaving for [place] on Saturday and is worried that he could have a diverticulitis flare up while he is gone. Patient reports Dr. Russella Dar would normally give him a prescription for Cipro and Flaygl before he leaves out of town. Patient states he is not symptomatic currently. Informed patient that Dr. Russella Dar not in the office this week.   DOD Dr. Marina Goodell, please advise if patient can have the prescriptions before he goes out of town.

## 2023-05-03 NOTE — Telephone Encounter (Signed)
Okay to provide a prescription for Cipro and Flagyl for him to have available while he travels.

## 2023-05-05 ENCOUNTER — Encounter: Payer: Self-pay | Admitting: Family Medicine

## 2023-05-05 ENCOUNTER — Ambulatory Visit: Payer: Medicare Other | Admitting: Family Medicine

## 2023-05-05 VITALS — BP 110/70 | HR 64 | Temp 97.8°F | Ht 69.0 in | Wt 202.4 lb

## 2023-05-05 DIAGNOSIS — I10 Essential (primary) hypertension: Secondary | ICD-10-CM

## 2023-05-05 DIAGNOSIS — I152 Hypertension secondary to endocrine disorders: Secondary | ICD-10-CM

## 2023-05-05 DIAGNOSIS — E1159 Type 2 diabetes mellitus with other circulatory complications: Secondary | ICD-10-CM | POA: Diagnosis not present

## 2023-05-05 DIAGNOSIS — E785 Hyperlipidemia, unspecified: Secondary | ICD-10-CM | POA: Diagnosis not present

## 2023-05-05 DIAGNOSIS — N50812 Left testicular pain: Secondary | ICD-10-CM | POA: Diagnosis not present

## 2023-05-05 DIAGNOSIS — E119 Type 2 diabetes mellitus without complications: Secondary | ICD-10-CM

## 2023-05-05 DIAGNOSIS — K5792 Diverticulitis of intestine, part unspecified, without perforation or abscess without bleeding: Secondary | ICD-10-CM | POA: Diagnosis not present

## 2023-05-05 DIAGNOSIS — I25118 Atherosclerotic heart disease of native coronary artery with other forms of angina pectoris: Secondary | ICD-10-CM

## 2023-05-05 DIAGNOSIS — E118 Type 2 diabetes mellitus with unspecified complications: Secondary | ICD-10-CM | POA: Diagnosis not present

## 2023-05-05 LAB — COMPREHENSIVE METABOLIC PANEL
ALT: 17 U/L (ref 0–53)
AST: 24 U/L (ref 0–37)
Albumin: 4.3 g/dL (ref 3.5–5.2)
Alkaline Phosphatase: 46 U/L (ref 39–117)
BUN: 7 mg/dL (ref 6–23)
CO2: 28 meq/L (ref 19–32)
Calcium: 9.2 mg/dL (ref 8.4–10.5)
Chloride: 101 meq/L (ref 96–112)
Creatinine, Ser: 0.86 mg/dL (ref 0.40–1.50)
GFR: 84.77 mL/min (ref >60.00)
Glucose, Bld: 108 mg/dL — ABNORMAL HIGH (ref 70–99)
Potassium: 4.4 meq/L (ref 3.5–5.1)
Sodium: 136 meq/L (ref 135–145)
Total Bilirubin: 0.7 mg/dL (ref 0.2–1.2)
Total Protein: 6.7 g/dL (ref 6.0–8.3)

## 2023-05-05 LAB — CBC
HCT: 41.8 % (ref 39.0–52.0)
Hemoglobin: 14.1 g/dL (ref 13.0–17.0)
MCHC: 33.8 g/dL (ref 30.0–36.0)
MCV: 90.9 fL (ref 78.0–100.0)
Platelets: 220 10*3/uL (ref 150.0–400.0)
RBC: 4.6 Mil/uL (ref 4.22–5.81)
RDW: 13.9 % (ref 11.5–15.5)
WBC: 6.5 10*3/uL (ref 4.0–10.5)

## 2023-05-05 LAB — POCT GLYCOSYLATED HEMOGLOBIN (HGB A1C): Hemoglobin A1C: 6.4 % — AB (ref 4.0–5.6)

## 2023-05-05 LAB — URINALYSIS, ROUTINE W REFLEX MICROSCOPIC
Bilirubin Urine: NEGATIVE
Hgb urine dipstick: NEGATIVE
Ketones, ur: NEGATIVE
Leukocytes,Ua: NEGATIVE
Nitrite: NEGATIVE
RBC / HPF: NONE SEEN (ref 0–?)
Specific Gravity, Urine: 1.01 (ref 1.000–1.030)
Total Protein, Urine: NEGATIVE
Urine Glucose: NEGATIVE
Urobilinogen, UA: 0.2 (ref 0.0–1.0)
pH: 7.5 (ref 5.0–8.0)

## 2023-05-05 NOTE — Patient Instructions (Signed)
It was a pleasure meeting you today. Thank you for allowing me to take part in your health care.  Our goals for today as we discussed include:  Refills sent for medications  We will get some labs today.  If they are abnormal or we need to do something about them, I will call you.  If they are normal, I will send you a message on MyChart (if it is active) or a letter in the mail.  If you don't hear from [place] in 2 weeks, please call the office at the number below.     This is a list of the screening recommended for you and due dates:  Health Maintenance  Topic Date Due   Eye exam for diabetics  12/14/2021   Flu Shot  08/16/2023*   Yearly kidney health urinalysis for diabetes  11/03/2023   Hemoglobin A1C  11/03/2023   Medicare Annual Wellness Visit  12/16/2023   Yearly kidney function blood test for diabetes  12/30/2023   Colon Cancer Screening  04/27/2026   DTaP/Tdap/Td vaccine (3 - Td or Tdap) 06/26/2030   Pneumonia Vaccine  Completed   Hepatitis C Screening  Completed   HPV Vaccine  Aged Out   Complete foot exam   Discontinued   COVID-19 Vaccine  Discontinued   Zoster (Shingles) Vaccine  Discontinued  *Topic was postponed. The date shown is not the original due date.      If you have any questions or concerns, please do not hesitate to call the office at [phone].  I look forward to our next visit and until then take care and stay safe.  Regards,   Dana Allan, MD   [facility]

## 2023-05-05 NOTE — Progress Notes (Signed)
SUBJECTIVE:   Chief Complaint  Patient presents with   Medical Management of Chronic Issues   HPI Presents to clinic for follow up chronic disease management  Discussed the use of AI scribe software for clinical note transcription with the patient, who gave verbal consent to proceed.  History of Present Illness The patient, with a history of diverticulitis, presented for a six-month follow-up. He reported a recent flare-up of diverticulitis, which was managed with Cipro and Flagyl. The patient described the flare-up as severe, rating the pain as a 12 on a scale of 1-10, and noted that it was so intense he was unable to sleep. The patient also reported experiencing chills, but was unsure if this was related to the diverticulitis or due to a cool environment. He had a colonoscopy performed recently, which reportedly showed no significant findings.  The patient also reported a three-month history of intermittent left testicular discomfort, which was exacerbated by physical activity such as walking or running. He denied any associated urinary symptoms, and there was no evidence of a mass or nodules on self-examination. The patient had previously seen a urologist for this issue, but was not satisfied with the care received and planned to seek a second opinion.  In terms of his diabetes management, the patient reported that his blood glucose levels had been running around 120, but he had managed to reduce it to 97 on one occasion. He expressed a desire to maintain his blood glucose levels below 6. The patient also reported taking nitro, time-released nitro, and was aware that he could not take Viagra due to this medication.  The patient's diet was discussed, with the patient reporting a diet consisting of ginger snap cookies, bananas, blueberries, oatmeal, and boiled eggs. He admitted to sometimes only eating once or twice a day due to a busy work schedule.  The patient also reported taking Plavix,  metoprolol, and Bentyl, the latter of which was taken once daily for abdominal discomfort. He expressed a desire for refills on his medications. The patient's sister was responsible for managing his medication regimen.    PERTINENT PMH / PSH: As above  OBJECTIVE:  BP 110/70   Pulse 64   Temp 97.8 F (36.6 C) (Oral)   Ht 5\' 9"  (1.753 m)   Wt 202 lb 6.4 oz (91.8 kg)   SpO2 97%   BMI 29.89 kg/m    Physical Exam Vitals reviewed.  Constitutional:      General: He is not in acute distress.    Appearance: Normal appearance. He is not ill-appearing, toxic-appearing or diaphoretic.  Eyes:     General:        Right eye: No discharge.        Left eye: No discharge.  Cardiovascular:     Rate and Rhythm: Normal rate and regular rhythm.     Heart sounds: Normal heart sounds.  Pulmonary:     Effort: Pulmonary effort is normal.     Breath sounds: Normal breath sounds.  Abdominal:     General: Bowel sounds are normal.  Musculoskeletal:        General: Normal range of motion.     Cervical back: Normal range of motion.  Skin:    General: Skin is warm and dry.  Neurological:     Mental Status: He is alert and oriented to person, place, and time. Mental status is at baseline.  Psychiatric:        Mood and Affect: Mood normal.  Behavior: Behavior normal.        Thought Content: Thought content normal.        Judgment: Judgment normal.        05/06/2023   11:35 AM 12/16/2022   10:40 AM 11/03/2022   10:34 AM 05/04/2022    1:00 PM 12/01/2021   10:08 AM  Depression screen PHQ 2/9  Decreased Interest 0 0 0 0 0  Down, Depressed, Hopeless 0 0 0 0 0  PHQ - 2 Score 0 0 0 0 0  Altered sleeping 0 0 0 0   Tired, decreased energy 0 0 0 0   Change in appetite 0 0 0 0   Feeling bad or failure about yourself  0 0 0 0   Trouble concentrating 0 0 0 0   Moving slowly or fidgety/restless 0 0 0 0   Suicidal thoughts 0  0 0   PHQ-9 Score 0 0 0 0   Difficult doing work/chores Not difficult  at all Not difficult at all Not difficult at all Not difficult at all       05/06/2023   11:35 AM 11/03/2022   10:34 AM 05/04/2022    1:01 PM 09/22/2019    1:45 PM  GAD 7 : Generalized Anxiety Score  Nervous, Anxious, on Edge 0 0 0 0  Control/stop worrying 0 0 0 0  Worry too much - different things 0 0 0 0  Trouble relaxing 0 0 0 0  Restless 0 0 0 0  Easily annoyed or irritable 0 0 0 0  Afraid - awful might happen 0 0 0 0  Total GAD 7 Score 0 0 0 0  Anxiety Difficulty Not difficult at all Not difficult at all Not difficult at all Not difficult at all    ASSESSMENT/PLAN:  Diverticulitis Assessment & Plan: Recent flare-up managed with antibiotics. Colonoscopy performed with no significant findings. Patient has made dietary modifications. -Refill Cipro and Flagyl for future flare-ups as needed. -Provide patient with additional information on diet modifications to prevent flare-ups.  Orders: -     Ciprofloxacin HCl; Take 1 tablet (500 mg total) by mouth 2 (two) times daily.  Dispense: 20 tablet; Refill: 3 -     metroNIDAZOLE; Take 1 tablet (500 mg total) by mouth 2 (two) times daily.  Dispense: 20 tablet; Refill: 3  Hyperlipidemia, unspecified hyperlipidemia type  Pain in left testicle Assessment & Plan: Intermittent left testicular discomfort for approximately three months. No associated urinary symptoms or palpable masses. No recent sexual activity. -Collect urine sample for analysis. -Order testicular ultrasound. -Schedule physical exam in the next few days.  Orders: -     Urinalysis, Routine w reflex microscopic -     CBC  DM (diabetes mellitus), type 2 with complications (HCC) Assessment & Plan: Recent A1C of 6.4, slightly higher than patient's usual. Patient reports blood glucose levels have been variable, possibly related to recent infection. -Continue current diabetes management. Diet controlled -Encourage consistent diet and regular blood glucose  monitoring.  Orders: -     POCT glycosylated hemoglobin (Hb A1C)  Hypertension associated with diabetes (HCC) Assessment & Plan: Chronic.  Well-controlled per JNC 8 guideline BP at goal less than 140/90 for age. Continue Amlodipine 5 mg daily Continue Altace 10 mg daily Continue metoprolol XL 25 mg daily     Orders: -     Comprehensive metabolic panel -     Metoprolol Succinate ER; Take 1 tablet (25 mg total) by mouth daily.  Dispense: 90 tablet; Refill: 3 -     amLODIPine Besylate; Take 1 tablet (5 mg total) by mouth daily.  Dispense: 90 tablet; Refill: 3  Coronary artery disease of native artery of native heart with stable angina pectoris [facility]) Assessment & Plan: Doing well.  Asymptomatic.   Continue ASA 81 mg daily and Plavix 75 mg daily Continue Imdur 20 mg daily Continue metoprolol XL 25 mg daily     General Health Maintenance -Declined flu vaccine.    PDMP reviewed  Return in about 1 day (around 05/06/2023) for PCP.  Dana Allan, MD

## 2023-05-06 ENCOUNTER — Ambulatory Visit
Admission: RE | Admit: 2023-05-06 | Discharge: 2023-05-06 | Disposition: A | Discharge status: Home / Self Care | Payer: Medicare Other | Source: Ambulatory Visit | Attending: Family Medicine | Admitting: Family Medicine

## 2023-05-06 ENCOUNTER — Encounter: Payer: Self-pay | Admitting: Family Medicine

## 2023-05-06 ENCOUNTER — Ambulatory Visit (INDEPENDENT_AMBULATORY_CARE_PROVIDER_SITE_OTHER): Payer: Medicare Other | Admitting: Family Medicine

## 2023-05-06 ENCOUNTER — Ambulatory Visit: Payer: BLUE CROSS/BLUE SHIELD | Admitting: Family Medicine

## 2023-05-06 VITALS — BP 111/62 | HR 63 | Temp 98.0°F | Resp 18 | Ht 69.0 in | Wt 202.4 lb

## 2023-05-06 DIAGNOSIS — N50812 Left testicular pain: Secondary | ICD-10-CM

## 2023-05-06 DIAGNOSIS — N433 Hydrocele, unspecified: Secondary | ICD-10-CM | POA: Diagnosis not present

## 2023-05-06 DIAGNOSIS — N503 Cyst of epididymis: Secondary | ICD-10-CM | POA: Diagnosis not present

## 2023-05-06 NOTE — Patient Instructions (Signed)
It was a pleasure meeting you today. Thank you for allowing me to take part in your health care.  Our goals for today as we discussed include:  Ultrasound has been ordered They will call to schedule appointment  If you have any questions or concerns, please do not hesitate to call the office at [phone].  I look forward to our next visit and until then take care and stay safe.  Regards,   Dana Allan, MD   [facility]

## 2023-05-06 NOTE — Assessment & Plan Note (Signed)
Intermittent discomfort, exacerbated by walking. No acute pain, fever, or bulging noted. Physical examination revealed tenderness on palpation, but no lumps or masses. Recent urine negative and no leukocytosis -Order scrotal ultrasound to further evaluate the cause of discomfort given ongoing symptoms

## 2023-05-06 NOTE — Telephone Encounter (Signed)
Noted  

## 2023-05-06 NOTE — Progress Notes (Signed)
SUBJECTIVE:   Chief Complaint  Patient presents with   Testicle Pain   HPI Patient presents for acute visit  Discussed the use of AI scribe software for clinical note transcription with the patient, who gave verbal consent to proceed.  History of Present Illness The patient, with a history of diverticulitis, presents with intermittent discomfort in the left testicle. The discomfort, described as not sharp, is exacerbated by walking and was severe enough to wake the patient at 4 AM on one occasion. The patient managed the discomfort with a cold pack. Denies any fevers, urinary symptoms and has not been sexually active.  The patient also reported a history of diverticulitis, which was causing concurrent pain, but has since resolved. The testicular discomfort is localized to the left testicle, with no associated groin pain. The patient denies any bulging or hernia-like symptoms in the area. The patient also has a longstanding skin tag in the area.    PERTINENT PMH / PSH: As above  OBJECTIVE:  BP 111/62   Pulse 63   Temp 98 F (36.7 C)   Resp 18   Ht 5\' 9"  (1.753 m)   Wt 202 lb 6 oz (91.8 kg)   SpO2 99%   BMI 29.89 kg/m    Physical Exam Vitals reviewed. Exam conducted with a chaperone present.  Abdominal:     Hernia: There is no hernia in the left inguinal area or right inguinal area.  Genitourinary:    Pubic Area: No rash.      Penis: Normal and circumcised.      Testes: Cremasteric reflex is present.        Right: Mass, tenderness or swelling not present.        Left: Tenderness present. Swelling, testicular hydrocele or varicocele not present.     Epididymis:     Right: Normal.     Left: Normal.  Lymphadenopathy:     Lower Body: No right inguinal adenopathy. No left inguinal adenopathy.        05/06/2023   11:35 AM 12/16/2022   10:40 AM 11/03/2022   10:34 AM 05/04/2022    1:00 PM 12/01/2021   10:08 AM  Depression screen PHQ 2/9  Decreased Interest 0 0 0 0 0   Down, Depressed, Hopeless 0 0 0 0 0  PHQ - 2 Score 0 0 0 0 0  Altered sleeping 0 0 0 0   Tired, decreased energy 0 0 0 0   Change in appetite 0 0 0 0   Feeling bad or failure about yourself  0 0 0 0   Trouble concentrating 0 0 0 0   Moving slowly or fidgety/restless 0 0 0 0   Suicidal thoughts 0  0 0   PHQ-9 Score 0 0 0 0   Difficult doing work/chores Not difficult at all Not difficult at all Not difficult at all Not difficult at all       05/06/2023   11:35 AM 11/03/2022   10:34 AM 05/04/2022    1:01 PM 09/22/2019    1:45 PM  GAD 7 : Generalized Anxiety Score  Nervous, Anxious, on Edge 0 0 0 0  Control/stop worrying 0 0 0 0  Worry too much - different things 0 0 0 0  Trouble relaxing 0 0 0 0  Restless 0 0 0 0  Easily annoyed or irritable 0 0 0 0  Afraid - awful might happen 0 0 0 0  Total GAD 7 Score 0 0  0 0  Anxiety Difficulty Not difficult at all Not difficult at all Not difficult at all Not difficult at all    ASSESSMENT/PLAN:  Pain in left testicle Assessment & Plan: Intermittent discomfort, exacerbated by walking. No acute pain, fever, or bulging noted. Physical examination revealed tenderness on palpation, but no lumps or masses. Recent urine negative and no leukocytosis -Order scrotal ultrasound to further evaluate the cause of discomfort given ongoing symptoms   Orders: -     US SCROTUM W/DOPPLER; Future    PDMP reviewed  Return if symptoms worsen or fail to improve, for PCP.  Dana Allan, MD

## 2023-05-07 ENCOUNTER — Telehealth: Payer: Self-pay

## 2023-05-07 NOTE — Telephone Encounter (Signed)
Copied from CRM [id number]. Topic: Clinical - Lab/Test Results >> May 06, 2023  5:40 PM Sonny Dandy B wrote: Reason for CRM: Morrie Sheldon called from [facility] regarding pt's test result, ultrasound was negative for Torsion, Bilateral hydroceles. Left epididymal cyst.  Please call back at [phone]

## 2023-05-12 DIAGNOSIS — E118 Type 2 diabetes mellitus with unspecified complications: Secondary | ICD-10-CM | POA: Insufficient documentation

## 2023-05-12 DIAGNOSIS — E119 Type 2 diabetes mellitus without complications: Secondary | ICD-10-CM | POA: Insufficient documentation

## 2023-05-12 MED ORDER — CIPROFLOXACIN HCL 500 MG PO TABS
500.0000 mg | ORAL_TABLET | Freq: Two times a day (BID) | ORAL | 3 refills | Status: DC
Start: 2023-05-19 — End: 2023-06-17

## 2023-05-12 MED ORDER — METRONIDAZOLE 500 MG PO TABS: 500.0000 mg | ORAL_TABLET | Freq: Two times a day (BID) | ORAL | 3 refills | Status: DC

## 2023-05-12 MED ORDER — AMLODIPINE BESYLATE 5 MG PO TABS: 5.0000 mg | ORAL_TABLET | Freq: Every day | ORAL | 3 refills | Status: DC

## 2023-05-12 MED ORDER — METOPROLOL SUCCINATE ER 25 MG PO TB24: 25.0000 mg | ORAL_TABLET | Freq: Every day | ORAL | 3 refills | Status: DC

## 2023-05-12 NOTE — Assessment & Plan Note (Signed)
Intermittent left testicular discomfort for approximately three months. No associated urinary symptoms or palpable masses. No recent sexual activity. -Collect urine sample for analysis. -Order testicular ultrasound. -Schedule physical exam in the next few days.

## 2023-05-12 NOTE — Assessment & Plan Note (Signed)
Recent flare-up managed with antibiotics. Colonoscopy performed with no significant findings. Patient has made dietary modifications. -Refill Cipro and Flagyl for future flare-ups as needed. -Provide patient with additional information on diet modifications to prevent flare-ups.

## 2023-05-12 NOTE — Assessment & Plan Note (Signed)
Doing well.  Asymptomatic.   Continue ASA 81 mg daily and Plavix 75 mg daily Continue Imdur 20 mg daily Continue metoprolol XL 25 mg daily

## 2023-05-12 NOTE — Assessment & Plan Note (Signed)
Chronic.  Well-controlled per JNC 8 guideline BP at goal less than 140/90 for age. Continue Amlodipine 5 mg daily Continue Altace 10 mg daily Continue metoprolol XL 25 mg daily

## 2023-05-12 NOTE — Assessment & Plan Note (Signed)
Recent A1C of 6.4, slightly higher than patient's usual. Patient reports blood glucose levels have been variable, possibly related to recent infection. -Continue current diabetes management. Diet controlled -Encourage consistent diet and regular blood glucose monitoring.

## 2023-05-13 ENCOUNTER — Encounter: Payer: Self-pay | Admitting: Gastroenterology

## 2023-05-16 ENCOUNTER — Encounter: Payer: Self-pay | Admitting: Family Medicine

## 2023-05-17 ENCOUNTER — Encounter: Payer: Self-pay | Admitting: Family Medicine

## 2023-05-18 ENCOUNTER — Other Ambulatory Visit: Payer: Self-pay | Admitting: Family Medicine

## 2023-05-18 DIAGNOSIS — N50812 Left testicular pain: Secondary | ICD-10-CM

## 2023-05-18 DIAGNOSIS — R7989 Other specified abnormal findings of blood chemistry: Secondary | ICD-10-CM

## 2023-05-18 DIAGNOSIS — N401 Enlarged prostate with lower urinary tract symptoms: Secondary | ICD-10-CM

## 2023-05-20 NOTE — Telephone Encounter (Signed)
 Noted.

## 2023-05-27 ENCOUNTER — Ambulatory Visit (INDEPENDENT_AMBULATORY_CARE_PROVIDER_SITE_OTHER): Payer: Medicare Other | Admitting: Urology

## 2023-05-27 ENCOUNTER — Encounter: Payer: Self-pay | Admitting: Urology

## 2023-05-27 VITALS — BP 122/73 | HR 54 | Ht 68.0 in | Wt 195.0 lb

## 2023-05-27 DIAGNOSIS — N50819 Testicular pain, unspecified: Secondary | ICD-10-CM | POA: Diagnosis not present

## 2023-05-27 DIAGNOSIS — R109 Unspecified abdominal pain: Secondary | ICD-10-CM

## 2023-05-27 DIAGNOSIS — N50812 Left testicular pain: Secondary | ICD-10-CM

## 2023-05-27 LAB — MICROSCOPIC EXAMINATION: Bacteria, UA: NONE SEEN

## 2023-05-27 LAB — URINALYSIS, COMPLETE
Bilirubin, UA: NEGATIVE
Glucose, UA: NEGATIVE
Ketones, UA: NEGATIVE
Leukocytes,UA: NEGATIVE
Nitrite, UA: NEGATIVE
Protein,UA: NEGATIVE
RBC, UA: NEGATIVE
Specific Gravity, UA: <1.005 — ABNORMAL LOW (ref 1.005–1.030)
Urobilinogen, Ur: 0.2 mg/dL (ref 0.2–1.0)
pH, UA: 6 (ref 5.0–7.5)

## 2023-05-27 LAB — BLADDER SCAN AMB NON-IMAGING: PVR: 21 WU

## 2023-05-27 MED ORDER — CELECOXIB 50 MG PO CAPS: 50.0000 mg | ORAL_CAPSULE | Freq: Two times a day (BID) | ORAL | 1 refills | Status: DC

## 2023-05-27 NOTE — Progress Notes (Signed)
 05/27/2023 5:01 PM   Kevin Webb 01-Apr-1946 [id number]  Referring provider: Hope Merle, MD [address] [place],  [place] [address]  Urological history: 1. BPH with LU TS -PSA (10/2022) 0.70 -Avodart  0.5 mg daily and tamsulosin  0.4 mg daily  2.  Hypogonadism -Contributing factors of age, diabetes and obesity -Discontinue therapy due to cardiac issues and did not feel clinical improvement  3.  Erectile dysfunction -Contributing factors of age, BPH, hypogonadism, diabetes, obesity, CAD, HLD, alcohol consumption and Peyronie's disease  -Not a candidate for PDE 5 inhibitors due to nitrate use  Chief Complaint  Patient presents with   Benign Prostatic Hypertrophy   HPI: Kevin Webb is a 78 y.o. male who presents today for aching left testicle.    Previous records reviewed.   Starting sometime in August he had a flareup of his diverticulitis.  He was having some left lower quadrant pain at that time and he took the medication and the diverticulitis abated, but the left testicular pain continued.  He describes the pain as a dull ache.  It does get sharp at times waking him up and requiring him to use ice packs.  He denied any scrotal swelling, scrotal trauma, erythema, lumps or masses, fever or chills.  He also denied any urinary symptoms.  He also has had pain in his left hip and his left medial thigh.  He does have a history of nephrolithiasis and also has a history of a bad back.    Patient denies any modifying or aggravating factors.  Patient denies any recent UTI's, gross hematuria, dysuria or suprapubic/flank pain.  Patient denies any fevers, chills, nausea or vomiting.    UA benign   PMH: Past Medical History:  Diagnosis Date   Acute cholecystitis 04/18/2013   Adenomatous colon polyp 12/2004   Adenomatous colon polyp 12/16/2004   Anemia    Anemia    Blood transfusion without reported diagnosis    BPH associated with nocturia    CAD (coronary artery  disease)    a. s/p CABG x4 in 2000 with LIMA-LAD, reverse SVG-IM, reverse SVG-acute margin and reverse SVG-RCA b. s/p BMS to SVG-RI in 2012 c. 12/2016: PCI/DES to SVG-RCA and PCI/DES to SVG-RI (Dr. Lavona)    Carotid artery stenosis    mild    Cataract    Cervical radiculopathy 03/26/2020   Clotting disorder (HCC)    COLONIC POLYPS, HX OF 09/30/2006   Qualifier: Diagnosis of   By: Tish MD, William          Polypectomy 2004, 2009; Dr Aneita         Coronary atherosclerosis of artery bypass graft 09/30/2006   Qualifier: Diagnosis of   By: Tish MD, William       Diabetes mellitus without complication (HCC) 09/28/2016   recent A1C 6.1 12/25/16 controlled with diet and exercise    Diverticulitis 2008/2009   Diverticulitis    Dizziness 04/21/2020   Educated about COVID-19 virus infection 10/06/2018   GERD 02/22/2007   Qualifier: Diagnosis of   By: Tish MD, Elsie       GERD (gastroesophageal reflux disease)    controlled as of 04/01/17    HSV infection    HTN (hypertension)    HTN (hypertension) 09/30/2006   Qualifier: Diagnosis of   By: Tish MD, William       Hx of CABG 12/25/2020   Hx of dysplastic nevus 05/04/2018   upper back spinal    Hyperglycemia  Hyperlipidemia    Hyperlipidemia LDL goal <70 01/08/2017   Impingement syndrome of left shoulder 2016   Impingement syndrome of left shoulder 05/18/2014   Kidney cysts 06/20/2019   Low back pain 02/15/2019   Low testosterone     Dr Kassie, [facility] ,[place]   Myocardial infarction [facility])    NSTEMI (non-ST elevated myocardial infarction) (HCC) 12/25/2016   Osteoarthritis of right knee    With trace effusion   Peyronie disease    Piriformis syndrome of right side 09/22/2019   Prediabetes 03/29/2018   Prostatitis 12/23/2017   Right arm weakness 02/02/2020   RLS (restless legs syndrome) 08/15/2013   RLS (restless legs syndrome) 08/15/2013   Rotator cuff injury    right; at 59-79 years old.    Sinus bradycardia    Sinus  bradycardia    Thrombocytopenia (HCC)    TIA (transient ischemic attack) 01/19/2020   Tubular adenoma of colon 06/26/2020   Type 2 diabetes mellitus without complication, without long-term current use of insulin  (HCC) 09/28/2016   Unstable angina (HCC) 12/01/2019   Vitamin D  deficiency     Surgical History: Past Surgical History:  Procedure Laterality Date   CHOLECYSTECTOMY N/A 04/21/2013   Procedure: LAPAROSCOPIC CHOLECYSTECTOMY WITH INTRAOPERATIVE CHOLANGIOGRAM;  Surgeon: Sherlean JINNY Laughter, MD;  Location: [facility] OR;  Service: General;  Laterality: N/A;   CHOLECYSTECTOMY     COLONOSCOPY W/ POLYPECTOMY  2004 & 2009    X 2; Dr Aneita; due 2014   CORONARY ANGIOPLASTY     CORONARY ARTERY BYPASS GRAFT  04/1999   4 vessels   CORONARY STENT INTERVENTION N/A 12/28/2016   Procedure: CORONARY STENT INTERVENTION;  Surgeon: Court Dorn JINNY, MD;  Location: [facility] INVASIVE CV LAB;  Service: Cardiovascular;  Laterality: N/A;   CORONARY STENT PLACEMENT  2012   Plavix    LAPAROSCOPIC CHOLECYSTECTOMY  04/21/2013   Dr Laughter; acutecholecystitis with necrosis   LEFT HEART CATH AND CORS/GRAFTS ANGIOGRAPHY N/A 12/28/2016   Procedure: LEFT HEART CATH AND CORS/GRAFTS ANGIOGRAPHY;  Surgeon: Court Dorn JINNY, MD;  Location: [facility] INVASIVE CV LAB;  Service: Cardiovascular;  Laterality: N/A;   LEFT HEART CATH AND CORS/GRAFTS ANGIOGRAPHY N/A 12/01/2019   Procedure: LEFT HEART CATH AND CORS/GRAFTS ANGIOGRAPHY;  Surgeon: Jordan, Peter M, MD;  [facility] INVASIVE CV LAB;  Service: Cardiovascular;  Laterality: N/A;   VASECTOMY      Home Medications:  Allergies as of 05/27/2023       Reactions   Darvocet [propoxyphene N-acetaminophen ] Hives   Propoxyphene Hives        Medication List        Accurate as of May 27, 2023  5:01 PM. If you have any questions, ask your nurse or doctor.          amLODipine  5 MG tablet Commonly known as: NORVASC  Take 1 tablet (5 mg total) by mouth daily.   aspirin  EC 81 MG  tablet Take 81 mg by mouth daily at 2 am. Swallow whole.   celecoxib  50 MG capsule Commonly known as: CeleBREX  Take 1 capsule (50 mg total) by mouth 2 (two) times daily.   cholecalciferol 25 MCG (1000 UNIT) tablet Commonly known as: VITAMIN D3 Take 1,000 Units by mouth daily at 2 am.   ciprofloxacin  500 MG tablet Commonly known as: CIPRO  Take 1 tablet (500 mg total) by mouth 2 (two) times daily.   clopidogrel  75 MG tablet Commonly known as: PLAVIX  TAKE 1 TABLET BY MOUTH DAILY   CO Q-10 PO Take 1 capsule by  mouth daily at 2 am.   dicyclomine  10 MG capsule Commonly known as: BENTYL  Take 1 capsule (10 mg total) by mouth 3 (three) times daily before meals.   dutasteride  0.5 MG capsule Commonly known as: AVODART  Take 0.5 mg by mouth daily.   ezetimibe  10 MG tablet Commonly known as: ZETIA  TAKE 1 TABLET BY MOUTH DAILY   Fish Oil 1000 MG Caps Take 2,000 mg by mouth daily at 2 am.   GINKGO BILOBA PO Take 1 capsule by mouth daily at 2 am.   isosorbide  mononitrate 120 MG 24 hr tablet Commonly known as: IMDUR  Take 1 tablet (120 mg total) by mouth daily.   MAGNESIUM  PO Take 1 tablet by mouth daily at 2 am.   metoprolol  succinate 25 MG 24 hr tablet Commonly known as: TOPROL -XL Take 1 tablet (25 mg total) by mouth daily.   metroNIDAZOLE  500 MG tablet Commonly known as: Flagyl  Take 1 tablet (500 mg total) by mouth 2 (two) times daily.   multivitamin with minerals Tabs tablet Take 1 tablet by mouth daily at 2 am.   niacin  250 MG tablet Commonly known as: VITAMIN B3 Take 250 mg by mouth daily.   nitroGLYCERIN  0.4 MG SL tablet Commonly known as: NITROSTAT  Place 1 tablet (0.4 mg total) under the tongue every 5 (five) minutes x 3 doses as needed.   pantoprazole  40 MG tablet Commonly known as: PROTONIX  Take 1 tablet (40 mg total) by mouth daily.   ramipril  10 MG capsule Commonly known as: ALTACE  Take 1 capsule (10 mg total) by mouth daily.   rosuvastatin  40 MG  tablet Commonly known as: CRESTOR  TAKE 1 TABLET AT BEDTIME   tamsulosin  0.4 MG Caps capsule Commonly known as: FLOMAX  Take 0.4 mg by mouth daily at 2 am.   vitamin C 1000 MG tablet Take 1,000 mg by mouth daily at 2 am. Reported on 09/26/2015   vitamin E 45 MG (100 UNITS) capsule Take 100 Units by mouth daily at 2 am.        Allergies:  Allergies  Allergen Reactions   Darvocet [Propoxyphene N-Acetaminophen ] Hives   Propoxyphene Hives    Family History: Family History  Problem Relation Age of Onset   Coronary artery disease Mother        CABG in 34s   CVA Mother        cns bleed from warfarin   Aneurysm Mother    Aortic aneurysm Father 31       ? abdominal   Heart attack Father 62   Cholecystitis Sister    Diabetes Brother    Diabetes Other        PGaunt   Cancer Neg Hx    Colon cancer Neg Hx    Prostate cancer Neg Hx    Esophageal cancer Neg Hx    Stomach cancer Neg Hx    Rectal cancer Neg Hx     Social History:  reports that he has never smoked. He has never used smokeless tobacco. He reports current alcohol use of about 10.0 - 12.0 standard drinks of alcohol per week. He reports that he does not use drugs.  ROS: Pertinent ROS in HPI  Physical Exam: BP 122/73   Pulse (!) 54   Ht 5' 8 (1.727 m)   Wt 195 lb (88.5 kg)   BMI 29.65 kg/m   Constitutional:  Well nourished. Alert and oriented, No acute distress. HEENT: [place] AT, moist mucus membranes.  Trachea midline Cardiovascular: No clubbing, cyanosis,  or edema. Respiratory: Normal respiratory effort, no increased work of breathing. GU: No CVA tenderness.  No bladder fullness or masses.  Patient with uncircumcised phallus.  Foreskin easily retracted  Urethral meatus is patent.  No penile discharge. No penile lesions or rashes. Scrotum without lesions, cysts, rashes and/or edema.  Testicles are located scrotally bilaterally. No masses are appreciated in the testicles. Left and right epididymis are  normal. Neurologic: Grossly intact, no focal deficits, moving all 4 extremities. Psychiatric: Normal mood and affect.  Laboratory Data: Lab Results  Component Value Date   WBC 6.5 05/05/2023   HGB 14.1 05/05/2023   HCT 41.8 05/05/2023   MCV 90.9 05/05/2023   PLT 220.0 05/05/2023    Lab Results  Component Value Date   CREATININE 0.86 05/05/2023    Lab Results  Component Value Date   PSA 0.70 10/27/2022   PSA 1.72 06/24/2020   PSA 1.35 12/01/2018    Lab Results  Component Value Date   TESTOSTERONE  480.76 11/11/2021    Lab Results  Component Value Date   HGBA1C 6.4 (A) 05/05/2023    Lab Results  Component Value Date   TSH 1.61 10/27/2022       Component Value Date/Time   CHOL 104 10/27/2022 0906   HDL 42.20 10/27/2022 0906   CHOLHDL 2 10/27/2022 0906   VLDL 25.6 10/27/2022 0906   LDLCALC 36 10/27/2022 0906    Lab Results  Component Value Date   AST 24 05/05/2023   Lab Results  Component Value Date   ALT 17 05/05/2023    Urinalysis See EPIC and HPI  I have reviewed the labs.   Pertinent Imaging: Narrative & Impression  CLINICAL DATA:  Left testicle pain   EXAM: SCROTAL ULTRASOUND   DOPPLER ULTRASOUND OF THE TESTICLES   TECHNIQUE: Complete ultrasound examination of the testicles, epididymis, and other scrotal structures was performed. Color and spectral Doppler ultrasound were also utilized to evaluate blood flow to the testicles.   COMPARISON:  None Available.   FINDINGS: Right testicle   Measurements: 3.8 x 1.8 x 2.8 cm. No mass or microlithiasis visualized.   Left testicle   Measurements: 3.9 x 1.8 x 2.1 cm. No mass or microlithiasis visualized.   Right epididymis:  Normal in size and appearance.   Left epididymis:  Cyst measuring 6 mm   Hydrocele:  Trace bilateral hydroceles.   Varicocele:  None visualized.   Pulsed Doppler interrogation of both testes demonstrates normal low resistance arterial and venous waveforms  bilaterally.   IMPRESSION: 1. Negative for testicular torsion or mass. 2. Trace bilateral hydroceles. 6 mm left epididymal cyst.     Electronically Signed   By: Luke Bun M.D.   On: 05/06/2023 17:24  I have independently reviewed the films.    Assessment & Plan:    1. Left testicular pain -Explained that it most likely is referred pain as his scrotal exam is benign and the scrotal ultrasound did not identify an etiology for the pain.  I explained that we would need to get a CT scan to evaluate for kidney stones causing the left testicular discomfort or it may be the results of muscle skeletal issues especially with the pain going down the medial back left thigh. -I gave him prescription for Celebrex  50 mg BID prn  2. Epididymal cyst -Testicular pain that reproducible with palpating epididymal cyst, so this is not the cause of his testicular pain  Return for CT report .  These notes generated with  voice recognition software. I apologize for typographical errors.  CLOTILDA HELON RIGGERS  [facility] [address]  [address] [place], [place] [address] [phone]

## 2023-05-31 LAB — CULTURE, URINE COMPREHENSIVE

## 2023-06-07 ENCOUNTER — Ambulatory Visit
Admission: RE | Admit: 2023-06-07 | Discharge: 2023-06-07 | Disposition: A | Discharge status: Home / Self Care | Payer: Medicare Other | Source: Ambulatory Visit | Attending: Urology | Admitting: Urology

## 2023-06-07 DIAGNOSIS — K5750 Diverticulosis of both small and large intestine without perforation or abscess without bleeding: Secondary | ICD-10-CM | POA: Diagnosis not present

## 2023-06-07 DIAGNOSIS — K4020 Bilateral inguinal hernia, without obstruction or gangrene, not specified as recurrent: Secondary | ICD-10-CM | POA: Diagnosis not present

## 2023-06-07 DIAGNOSIS — N1330 Unspecified hydronephrosis: Secondary | ICD-10-CM | POA: Diagnosis not present

## 2023-06-07 DIAGNOSIS — R109 Unspecified abdominal pain: Secondary | ICD-10-CM | POA: Diagnosis not present

## 2023-06-16 NOTE — Progress Notes (Unsigned)
06/17/2023 9:27 PM   Kevin Webb 1945-09-15 [id number]  Referring provider: Dana Allan, MD [address] [place],  [place] [address]  Urological history: 1. BPH with LU TS -PSA (10/2022) 0.70 -Avodart 0.5 mg daily and tamsulosin 0.4 mg daily  2.  Hypogonadism -Contributing factors of age, diabetes and obesity -Discontinue therapy due to cardiac issues and did not feel clinical improvement  3.  Erectile dysfunction -Contributing factors of age, BPH, hypogonadism, diabetes, obesity, CAD, HLD, alcohol consumption and Peyronie's disease  -Not a candidate for PDE 5 inhibitors due to nitrate use  No chief complaint on file.  HPI: Kevin Webb is a 78 y.o. male who presents today for aching left testicle.    Previous records reviewed.   At his visit on 05/27/2023, starting sometime in August he had a flareup of his diverticulitis.  He was having some left lower quadrant pain at that time and he took the medication and the diverticulitis abated, but the left testicular pain continued.  He describes the pain as a dull ache.  It does get sharp at times waking him up and requiring him to use ice packs.  He denied any scrotal swelling, scrotal trauma, erythema, lumps or masses, fever or chills.  He also denied any urinary symptoms.  He also has had pain in his left hip and his left medial thigh.  He does have a history of nephrolithiasis and also has a history of a "bad back."  Patient denies any modifying or aggravating factors.  Patient denies any recent UTI's, gross hematuria, dysuria or suprapubic/flank pain.  Patient denies any fevers, chills, nausea or vomiting.  UA benign.  Scrotal exam is benign.    CT renal stone study (05/2023) - Colonic diverticulosis with mild interstitial thickening adjacent to the sigmoid and descending colon, new since 07/16/2021.  Suspicious for component of noncomplicated diverticulitis.   PMH: Past Medical History:  Diagnosis Date   Acute  cholecystitis 04/18/2013   Adenomatous colon polyp 12/2004   Adenomatous colon polyp 12/16/2004   Anemia    Anemia    Blood transfusion without reported diagnosis    BPH associated with nocturia    CAD (coronary artery disease)    a. s/p CABG x4 in 2000 with LIMA-LAD, reverse SVG-IM, reverse SVG-acute margin and reverse SVG-RCA b. s/p BMS to SVG-RI in 2012 c. 12/2016: PCI/DES to SVG-RCA and PCI/DES to SVG-RI (Dr. Antoine Poche)    Carotid artery stenosis    mild    Cataract    Cervical radiculopathy 03/26/2020   Clotting disorder (HCC)    COLONIC POLYPS, HX OF 09/30/2006   Qualifier: Diagnosis of   By: Alwyn Ren MD, William          Polypectomy 2004, 2009; Dr Russella Dar         Coronary atherosclerosis of artery bypass graft 09/30/2006   Qualifier: Diagnosis of   By: Alwyn Ren MD, William       Diabetes mellitus without complication (HCC) 09/28/2016   recent A1C 6.1 12/25/16 controlled with diet and exercise    Diverticulitis 2008/2009   Diverticulitis    Dizziness 04/21/2020   Educated about COVID-19 virus infection 10/06/2018   GERD 02/22/2007   Qualifier: Diagnosis of   By: Alwyn Ren MD, Chrissie Noa       GERD (gastroesophageal reflux disease)    controlled as of 04/01/17    HSV infection    HTN (hypertension)    HTN (hypertension) 09/30/2006   Qualifier: Diagnosis of   By:  Alwyn Ren MD, Chrissie Noa       Hx of CABG 12/25/2020   Hx of dysplastic nevus 05/04/2018   upper back spinal    Hyperglycemia    Hyperlipidemia    Hyperlipidemia LDL goal <70 01/08/2017   Impingement syndrome of left shoulder 2016   Impingement syndrome of left shoulder 05/18/2014   Kidney cysts 06/20/2019   Low back pain 02/15/2019   Low testosterone    Dr Evelene Croon, [facility] ,   Myocardial infarction [facility])    NSTEMI (non-ST elevated myocardial infarction) (HCC) 12/25/2016   Osteoarthritis of right knee    With trace effusion   Peyronie disease    Piriformis syndrome of right side 09/22/2019   Prediabetes 03/29/2018    Prostatitis 12/23/2017   Right arm weakness 02/02/2020   RLS (restless legs syndrome) 08/15/2013   RLS (restless legs syndrome) 08/15/2013   Rotator cuff injury    right; at 69-89 years old.    Sinus bradycardia    Sinus bradycardia    Thrombocytopenia (HCC)    TIA (transient ischemic attack) 01/19/2020   Tubular adenoma of colon 06/26/2020   Type 2 diabetes mellitus without complication, without long-term current use of insulin (HCC) 09/28/2016   Unstable angina (HCC) 12/01/2019   Vitamin D deficiency     Surgical History: Past Surgical History:  Procedure Laterality Date   CHOLECYSTECTOMY N/A 04/21/2013   Procedure: LAPAROSCOPIC CHOLECYSTECTOMY WITH INTRAOPERATIVE CHOLANGIOGRAM;  Surgeon: Currie Paris, MD;  Location: [facility] OR;  Service: General;  Laterality: N/A;   CHOLECYSTECTOMY     COLONOSCOPY W/ POLYPECTOMY  2004 & 2009    X 2; Dr Russella Dar; due 2014   CORONARY ANGIOPLASTY     CORONARY ARTERY BYPASS GRAFT  04/1999   4 vessels   CORONARY STENT INTERVENTION N/A 12/28/2016   Procedure: CORONARY STENT INTERVENTION;  Surgeon: Runell Gess, MD;  Location: [facility] INVASIVE CV LAB;  Service: Cardiovascular;  Laterality: N/A;   CORONARY STENT PLACEMENT  2012   Plavix   LAPAROSCOPIC CHOLECYSTECTOMY  04/21/2013   Dr Jamey Ripa; acutecholecystitis with necrosis   LEFT HEART CATH AND CORS/GRAFTS ANGIOGRAPHY N/A 12/28/2016   Procedure: LEFT HEART CATH AND CORS/GRAFTS ANGIOGRAPHY;  Surgeon: Runell Gess, MD;  Location: [facility] INVASIVE CV LAB;  Service: Cardiovascular;  Laterality: N/A;   LEFT HEART CATH AND CORS/GRAFTS ANGIOGRAPHY N/A 12/01/2019   Procedure: LEFT HEART CATH AND CORS/GRAFTS ANGIOGRAPHY;  Surgeon: Swazil[place], Peter M, MD;  [facility] INVASIVE CV LAB;  Service: Cardiovascular;  Laterality: N/A;   VASECTOMY      Home Medications:  Allergies as of 06/17/2023       Reactions   Darvocet [propoxyphene N-acetaminophen] Hives   Propoxyphene Hives        Medication List         Accurate as of June 16, 2023  9:27 PM. If you have any questions, ask your nurse or doctor.          amLODipine 5 MG tablet Commonly known as: NORVASC Take 1 tablet (5 mg total) by mouth daily.   aspirin EC 81 MG tablet Take 81 mg by mouth daily at 2 am. Swallow whole.   celecoxib 50 MG capsule Commonly known as: CeleBREX Take 1 capsule (50 mg total) by mouth 2 (two) times daily.   cholecalciferol 25 MCG (1000 UNIT) tablet Commonly known as: VITAMIN D3 Take 1,000 Units by mouth daily at 2 am.   ciprofloxacin 500 MG tablet Commonly known as: CIPRO Take 1 tablet (500 mg  total) by mouth 2 (two) times daily.   clopidogrel 75 MG tablet Commonly known as: PLAVIX TAKE 1 TABLET BY MOUTH DAILY   CO Q-10 PO Take 1 capsule by mouth daily at 2 am.   dicyclomine 10 MG capsule Commonly known as: BENTYL Take 1 capsule (10 mg total) by mouth 3 (three) times daily before meals.   dutasteride 0.5 MG capsule Commonly known as: AVODART Take 0.5 mg by mouth daily.   ezetimibe 10 MG tablet Commonly known as: ZETIA TAKE 1 TABLET BY MOUTH DAILY   Fish Oil 1000 MG Caps Take 2,000 mg by mouth daily at 2 am.   GINKGO BILOBA PO Take 1 capsule by mouth daily at 2 am.   isosorbide mononitrate 120 MG 24 hr tablet Commonly known as: IMDUR Take 1 tablet (120 mg total) by mouth daily.   MAGNESIUM PO Take 1 tablet by mouth daily at 2 am.   metoprolol succinate 25 MG 24 hr tablet Commonly known as: TOPROL-XL Take 1 tablet (25 mg total) by mouth daily.   metroNIDAZOLE 500 MG tablet Commonly known as: Flagyl Take 1 tablet (500 mg total) by mouth 2 (two) times daily.   multivitamin with minerals Tabs tablet Take 1 tablet by mouth daily at 2 am.   niacin 250 MG tablet Commonly known as: VITAMIN B3 Take 250 mg by mouth daily.   nitroGLYCERIN 0.4 MG SL tablet Commonly known as: NITROSTAT Place 1 tablet (0.4 mg total) under the tongue every 5 (five) minutes x 3 doses as needed.    pantoprazole 40 MG tablet Commonly known as: PROTONIX Take 1 tablet (40 mg total) by mouth daily.   ramipril 10 MG capsule Commonly known as: ALTACE Take 1 capsule (10 mg total) by mouth daily.   rosuvastatin 40 MG tablet Commonly known as: CRESTOR TAKE 1 TABLET AT BEDTIME   tamsulosin 0.4 MG Caps capsule Commonly known as: FLOMAX Take 0.4 mg by mouth daily at 2 am.   vitamin C 1000 MG tablet Take 1,000 mg by mouth daily at 2 am. Reported on 09/26/2015   vitamin E 45 MG (100 UNITS) capsule Take 100 Units by mouth daily at 2 am.        Allergies:  Allergies  Allergen Reactions   Darvocet [Propoxyphene N-Acetaminophen] Hives   Propoxyphene Hives    Family History: Family History  Problem Relation Age of Onset   Coronary artery disease Mother        CABG in 74s   CVA Mother        cns bleed from warfarin   Aneurysm Mother    Aortic aneurysm Father 22       ? abdominal   Heart attack Father 30   Cholecystitis Sister    Diabetes Brother    Diabetes Other        PGaunt   Cancer Neg Hx    Colon cancer Neg Hx    Prostate cancer Neg Hx    Esophageal cancer Neg Hx    Stomach cancer Neg Hx    Rectal cancer Neg Hx     Social History:  reports that he has never smoked. He has never used smokeless tobacco. He reports current alcohol use of about 10.0 - 12.0 standard drinks of alcohol per week. He reports that he does not use drugs.  ROS: Pertinent ROS in HPI  Physical Exam: There were no vitals taken for this visit.  Constitutional:  Well nourished. Alert and oriented, No acute distress.  HEENT: [place] AT, moist mucus membranes.  Trachea midline, no masses. Cardiovascular: No clubbing, cyanosis, or edema. Respiratory: Normal respiratory effort, no increased work of breathing. GI: Abdomen is soft, non tender, non distended, no abdominal masses. Liver and spleen not palpable.  No hernias appreciated.  Stool sample for occult testing is not indicated.   GU: No CVA  tenderness.  No bladder fullness or masses.  Patient with circumcised/uncircumcised phallus. ***Foreskin easily retracted***  Urethral meatus is patent.  No penile discharge. No penile lesions or rashes. Scrotum without lesions, cysts, rashes and/or edema.  Testicles are located scrotally bilaterally. No masses are appreciated in the testicles. Left and right epididymis are normal. Rectal: Patient with  normal sphincter tone. Anus and perineum without scarring or rashes. No rectal masses are appreciated. Prostate is approximately *** grams, *** nodules are appreciated. Seminal vesicles are normal. Skin: No rashes, bruises or suspicious lesions. Lymph: No cervical or inguinal adenopathy. Neurologic: Grossly intact, no focal deficits, moving all 4 extremities. Psychiatric: Normal mood and affect.    Laboratory Data: N/A   Pertinent Imaging: CLINICAL DATA:  Left lower quadrant pain. Left testicular pain. History of diverticulitis.   EXAM: CT ABDOMEN AND PELVIS WITHOUT CONTRAST   TECHNIQUE: Multidetector CT imaging of the abdomen and pelvis was performed following the standard protocol without IV contrast.   RADIATION DOSE REDUCTION: This exam was performed according to the departmental dose-optimization program which includes automated exposure control, adjustment of the mA and/or kV according to patient size and/or use of iterative reconstruction technique.   COMPARISON:  07/16/2021   FINDINGS: Lower chest: Clear lung bases. Mild cardiomegaly. Right coronary artery calcification.   Hepatobiliary: Normal liver. Cholecystectomy, without biliary ductal dilatation.   Pancreas: The pancreatic tail is foreshortened, possibly developmental. No duct dilatation or acute inflammation.   Spleen: Normal in size, without focal abnormality.   Adrenals/Urinary Tract: Normal adrenal glands. No renal calculi. Bilateral fluid density renal lesions of maximally 2.1 cm are likely cysts and do  not warrant specific imaging follow-up.   Moderate left-sided hydronephrosis again identified without hydroureter. No ureteric or bladder calculi.   Stomach/Bowel: Normal stomach, without wall thickening. Transverse duodenal diverticulum. Otherwise normal small bowel.   Extensive colonic diverticulosis. Wall thickening within the sigmoid is likely related to muscular hypertrophy. Subtle interstitial thickening within the sigmoid mesocolon including on 67/2, new since 07/16/2021. No free perforation or drainable fluid collection. There is also probable pericolonic interstitial thickening adjacent the distal descending colon including on 53/2. Normal terminal ileum.   Vascular/Lymphatic: Aortic atherosclerosis. Retroaortic left renal vein. No abdominopelvic adenopathy.   Reproductive: Normal prostate.   Other: No significant free fluid. Bilateral small fat containing inguinal hernias.   Musculoskeletal: No acute osseous abnormality.   IMPRESSION: 1. No urinary tract calculi. Moderate left-sided hydronephrosis without hydroureter, favoring chronic ureteropelvic junction obstruction. 2. Colonic diverticulosis with mild interstitial thickening adjacent to the sigmoid and descending colon, new since 07/16/2021. Suspicious for component of noncomplicated diverticulitis. 3. Bilateral fat containing inguinal hernias. 4. Incidental findings, including: Coronary artery atherosclerosis. Aortic Atherosclerosis (ICD10-I70.0).     Electronically Signed   By: Jeronimo Greaves M.D.   On: 06/14/2023 08:41   I have independently reviewed the films.  See HPI.   Assessment & Plan:    1. Left testicular pain -may be a referred pain from the diverticulitis  2. Diverticulitis -given Cipro 250 mg BID x 10 days along with Augmentin 875/125 BID x 10 days  No follow-ups on file.  These  notes generated with voice recognition software. I apologize for typographical errors.  Cloretta Ned  [facility] [address]  [address] [place], [place] [address] [phone]

## 2023-06-17 ENCOUNTER — Ambulatory Visit (INDEPENDENT_AMBULATORY_CARE_PROVIDER_SITE_OTHER): Payer: Medicare Other | Admitting: Urology

## 2023-06-17 ENCOUNTER — Encounter: Payer: Self-pay | Admitting: Urology

## 2023-06-17 VITALS — BP 136/77 | HR 62 | Ht 68.0 in | Wt 195.0 lb

## 2023-06-17 DIAGNOSIS — N50819 Testicular pain, unspecified: Secondary | ICD-10-CM | POA: Diagnosis not present

## 2023-06-17 DIAGNOSIS — K5792 Diverticulitis of intestine, part unspecified, without perforation or abscess without bleeding: Secondary | ICD-10-CM | POA: Diagnosis not present

## 2023-06-17 DIAGNOSIS — N401 Enlarged prostate with lower urinary tract symptoms: Secondary | ICD-10-CM

## 2023-06-17 DIAGNOSIS — N138 Other obstructive and reflux uropathy: Secondary | ICD-10-CM

## 2023-07-05 ENCOUNTER — Other Ambulatory Visit: Payer: Self-pay | Admitting: Family Medicine

## 2023-07-09 ENCOUNTER — Telehealth: Payer: Self-pay | Admitting: Urology

## 2023-07-09 NOTE — Telephone Encounter (Signed)
[facility] called stating [facility] has been trying to reach out pertaining most recent medication prescribed by [facility]. PT has not been able to get medication and would like for someone to reach out to [facility].

## 2023-07-12 ENCOUNTER — Other Ambulatory Visit: Payer: Self-pay | Admitting: Family Medicine

## 2023-07-12 ENCOUNTER — Other Ambulatory Visit: Payer: Self-pay | Admitting: Urology

## 2023-07-12 ENCOUNTER — Other Ambulatory Visit: Payer: Self-pay

## 2023-07-12 ENCOUNTER — Encounter: Payer: Self-pay | Admitting: Family Medicine

## 2023-07-12 MED ORDER — DUTASTERIDE 0.5 MG PO CAPS
0.5000 mg | ORAL_CAPSULE | Freq: Every day | ORAL | 3 refills | Status: DC
  Filled 2023-07-12: qty 90, 90d supply, fill #0

## 2023-07-12 NOTE — Telephone Encounter (Signed)
 Rx request sent to urology who prescribed it.

## 2023-07-13 ENCOUNTER — Encounter: Payer: Self-pay | Admitting: Family Medicine

## 2023-07-13 ENCOUNTER — Other Ambulatory Visit: Payer: Self-pay

## 2023-07-13 MED ORDER — TAMSULOSIN HCL 0.4 MG PO CAPS: 0.4000 mg | ORAL_CAPSULE | Freq: Every day | ORAL | 2 refills | Status: DC

## 2023-07-13 MED ORDER — DUTASTERIDE 0.5 MG PO CAPS: 0.5000 mg | ORAL_CAPSULE | Freq: Every day | ORAL | 3 refills | Status: DC

## 2023-07-13 MED ORDER — DUTASTERIDE 0.5 MG PO CAPS: 0.5000 mg | ORAL_CAPSULE | Freq: Every day | ORAL | 3 refills | Status: AC

## 2023-07-13 NOTE — Telephone Encounter (Signed)
 Pt is requesting a refill of tamsulosin and Dutasteride.   Meds previously rxed by Evelene Croon.  See records.   Med sent to [facility].  1 year f/u made for 9/25 with SM.

## 2023-08-13 ENCOUNTER — Other Ambulatory Visit: Payer: Self-pay

## 2023-08-13 ENCOUNTER — Emergency Department (HOSPITAL_BASED_OUTPATIENT_CLINIC_OR_DEPARTMENT_OTHER)

## 2023-08-13 ENCOUNTER — Encounter (HOSPITAL_BASED_OUTPATIENT_CLINIC_OR_DEPARTMENT_OTHER): Payer: Self-pay | Admitting: Emergency Medicine

## 2023-08-13 ENCOUNTER — Telehealth: Payer: Self-pay | Admitting: Cardiology

## 2023-08-13 ENCOUNTER — Emergency Department (HOSPITAL_BASED_OUTPATIENT_CLINIC_OR_DEPARTMENT_OTHER): Admitting: Radiology

## 2023-08-13 ENCOUNTER — Inpatient Hospital Stay (HOSPITAL_BASED_OUTPATIENT_CLINIC_OR_DEPARTMENT_OTHER)
Admission: EM | Admit: 2023-08-13 | Discharge: 2023-08-18 | DRG: 035 | Disposition: A | Discharge status: Home / Self Care | Attending: Student | Admitting: Student

## 2023-08-13 DIAGNOSIS — Z885 Allergy status to narcotic agent status: Secondary | ICD-10-CM

## 2023-08-13 DIAGNOSIS — Z006 Encounter for examination for normal comparison and control in clinical research program: Secondary | ICD-10-CM | POA: Diagnosis not present

## 2023-08-13 DIAGNOSIS — Z951 Presence of aortocoronary bypass graft: Secondary | ICD-10-CM

## 2023-08-13 DIAGNOSIS — I25119 Atherosclerotic heart disease of native coronary artery with unspecified angina pectoris: Secondary | ICD-10-CM | POA: Diagnosis not present

## 2023-08-13 DIAGNOSIS — Z79899 Other long term (current) drug therapy: Secondary | ICD-10-CM | POA: Diagnosis not present

## 2023-08-13 DIAGNOSIS — E1151 Type 2 diabetes mellitus with diabetic peripheral angiopathy without gangrene: Secondary | ICD-10-CM | POA: Diagnosis present

## 2023-08-13 DIAGNOSIS — I97711 Intraoperative cardiac arrest during other surgery: Secondary | ICD-10-CM | POA: Diagnosis not present

## 2023-08-13 DIAGNOSIS — R001 Bradycardia, unspecified: Secondary | ICD-10-CM | POA: Diagnosis not present

## 2023-08-13 DIAGNOSIS — E1165 Type 2 diabetes mellitus with hyperglycemia: Secondary | ICD-10-CM | POA: Diagnosis not present

## 2023-08-13 DIAGNOSIS — Z8249 Family history of ischemic heart disease and other diseases of the circulatory system: Secondary | ICD-10-CM

## 2023-08-13 DIAGNOSIS — I2510 Atherosclerotic heart disease of native coronary artery without angina pectoris: Secondary | ICD-10-CM | POA: Diagnosis present

## 2023-08-13 DIAGNOSIS — R9089 Other abnormal findings on diagnostic imaging of central nervous system: Secondary | ICD-10-CM | POA: Diagnosis not present

## 2023-08-13 DIAGNOSIS — I639 Cerebral infarction, unspecified: Secondary | ICD-10-CM

## 2023-08-13 DIAGNOSIS — Z886 Allergy status to analgesic agent status: Secondary | ICD-10-CM | POA: Diagnosis not present

## 2023-08-13 DIAGNOSIS — Z955 Presence of coronary angioplasty implant and graft: Secondary | ICD-10-CM | POA: Diagnosis not present

## 2023-08-13 DIAGNOSIS — Z860101 Personal history of adenomatous and serrated colon polyps: Secondary | ICD-10-CM

## 2023-08-13 DIAGNOSIS — I63231 Cerebral infarction due to unspecified occlusion or stenosis of right carotid arteries: Secondary | ICD-10-CM | POA: Diagnosis not present

## 2023-08-13 DIAGNOSIS — G2581 Restless legs syndrome: Secondary | ICD-10-CM | POA: Diagnosis present

## 2023-08-13 DIAGNOSIS — I6521 Occlusion and stenosis of right carotid artery: Secondary | ICD-10-CM | POA: Diagnosis not present

## 2023-08-13 DIAGNOSIS — Z7982 Long term (current) use of aspirin: Secondary | ICD-10-CM

## 2023-08-13 DIAGNOSIS — E119 Type 2 diabetes mellitus without complications: Secondary | ICD-10-CM | POA: Diagnosis not present

## 2023-08-13 DIAGNOSIS — I6389 Other cerebral infarction: Principal | ICD-10-CM | POA: Diagnosis present

## 2023-08-13 DIAGNOSIS — R079 Chest pain, unspecified: Secondary | ICD-10-CM | POA: Insufficient documentation

## 2023-08-13 DIAGNOSIS — G8929 Other chronic pain: Secondary | ICD-10-CM | POA: Diagnosis present

## 2023-08-13 DIAGNOSIS — I6523 Occlusion and stenosis of bilateral carotid arteries: Secondary | ICD-10-CM | POA: Diagnosis present

## 2023-08-13 DIAGNOSIS — Z823 Family history of stroke: Secondary | ICD-10-CM

## 2023-08-13 DIAGNOSIS — I252 Old myocardial infarction: Secondary | ICD-10-CM

## 2023-08-13 DIAGNOSIS — E1169 Type 2 diabetes mellitus with other specified complication: Secondary | ICD-10-CM | POA: Diagnosis present

## 2023-08-13 DIAGNOSIS — N400 Enlarged prostate without lower urinary tract symptoms: Secondary | ICD-10-CM | POA: Diagnosis present

## 2023-08-13 DIAGNOSIS — R072 Precordial pain: Secondary | ICD-10-CM | POA: Diagnosis not present

## 2023-08-13 DIAGNOSIS — Z833 Family history of diabetes mellitus: Secondary | ICD-10-CM

## 2023-08-13 DIAGNOSIS — I672 Cerebral atherosclerosis: Secondary | ICD-10-CM | POA: Diagnosis not present

## 2023-08-13 DIAGNOSIS — K219 Gastro-esophageal reflux disease without esophagitis: Secondary | ICD-10-CM | POA: Diagnosis present

## 2023-08-13 DIAGNOSIS — G9389 Other specified disorders of brain: Secondary | ICD-10-CM | POA: Diagnosis not present

## 2023-08-13 DIAGNOSIS — I25810 Atherosclerosis of coronary artery bypass graft(s) without angina pectoris: Secondary | ICD-10-CM | POA: Diagnosis not present

## 2023-08-13 DIAGNOSIS — E785 Hyperlipidemia, unspecified: Secondary | ICD-10-CM | POA: Diagnosis present

## 2023-08-13 DIAGNOSIS — R29700 NIHSS score 0: Secondary | ICD-10-CM | POA: Diagnosis present

## 2023-08-13 DIAGNOSIS — Z8673 Personal history of transient ischemic attack (TIA), and cerebral infarction without residual deficits: Secondary | ICD-10-CM | POA: Diagnosis not present

## 2023-08-13 DIAGNOSIS — Z9049 Acquired absence of other specified parts of digestive tract: Secondary | ICD-10-CM

## 2023-08-13 DIAGNOSIS — Z888 Allergy status to other drugs, medicaments and biological substances status: Secondary | ICD-10-CM

## 2023-08-13 DIAGNOSIS — F1010 Alcohol abuse, uncomplicated: Secondary | ICD-10-CM | POA: Diagnosis present

## 2023-08-13 DIAGNOSIS — R262 Difficulty in walking, not elsewhere classified: Secondary | ICD-10-CM | POA: Diagnosis present

## 2023-08-13 DIAGNOSIS — I6503 Occlusion and stenosis of bilateral vertebral arteries: Secondary | ICD-10-CM | POA: Diagnosis not present

## 2023-08-13 DIAGNOSIS — H538 Other visual disturbances: Secondary | ICD-10-CM | POA: Diagnosis present

## 2023-08-13 DIAGNOSIS — Z7902 Long term (current) use of antithrombotics/antiplatelets: Secondary | ICD-10-CM

## 2023-08-13 DIAGNOSIS — M1711 Unilateral primary osteoarthritis, right knee: Secondary | ICD-10-CM | POA: Diagnosis present

## 2023-08-13 DIAGNOSIS — I10 Essential (primary) hypertension: Secondary | ICD-10-CM | POA: Diagnosis present

## 2023-08-13 HISTORY — DX: Cerebral infarction, unspecified: I63.9

## 2023-08-13 LAB — BASIC METABOLIC PANEL WITH GFR
Anion gap: 7 (ref 5–15)
BUN: 10 mg/dL (ref 8–23)
CO2: 27 mmol/L (ref 22–32)
Calcium: 8.8 mg/dL — ABNORMAL LOW (ref 8.9–10.3)
Chloride: 103 mmol/L (ref 98–111)
Creatinine, Ser: 0.86 mg/dL (ref 0.61–1.24)
GFR, Estimated: >60 mL/min (ref >60)
Glucose, Bld: 169 mg/dL — ABNORMAL HIGH (ref 70–99)
Potassium: 4 mmol/L (ref 3.5–5.1)
Sodium: 137 mmol/L (ref 135–145)

## 2023-08-13 LAB — CBC
HCT: 41.9 % (ref 39.0–52.0)
Hemoglobin: 14 g/dL (ref 13.0–17.0)
MCH: 30.4 pg (ref 26.0–34.0)
MCHC: 33.4 g/dL (ref 30.0–36.0)
MCV: 91.1 fL (ref 80.0–100.0)
Platelets: 151 10*3/uL (ref 150–400)
RBC: 4.6 MIL/uL (ref 4.22–5.81)
RDW: 14.4 % (ref 11.5–15.5)
WBC: 6.3 10*3/uL (ref 4.0–10.5)
nRBC: 0 % (ref 0.0–0.2)

## 2023-08-13 LAB — TROPONIN I (HIGH SENSITIVITY)
Troponin I (High Sensitivity): 5 ng/L (ref <18)
Troponin I (High Sensitivity): 6 ng/L (ref <18)

## 2023-08-13 MED ORDER — CLOPIDOGREL BISULFATE 75 MG PO TABS
75.0000 mg | ORAL_TABLET | Freq: Every day | ORAL | Status: DC
  Administered 2023-08-14 – 2023-08-18 (×5): 75 mg via ORAL
  Filled 2023-08-13 (×6): qty 1

## 2023-08-13 MED ORDER — ASPIRIN 81 MG PO TBEC
81.0000 mg | DELAYED_RELEASE_TABLET | Freq: Every day | ORAL | Status: DC
  Administered 2023-08-13 – 2023-08-18 (×5): 81 mg via ORAL
  Filled 2023-08-13 (×6): qty 1

## 2023-08-13 MED ORDER — IOHEXOL 350 MG/ML SOLN
75.0000 mL | Freq: Once | INTRAVENOUS | Status: AC | PRN
  Administered 2023-08-13: 75 mL via INTRAVENOUS

## 2023-08-13 NOTE — Telephone Encounter (Signed)
 Called and spoke to pt. Currently getting evaluated at [facility] ED for yesterday's symptoms since earlier phone call. Will route to Dr. Antoine Poche as Lorain Childes.

## 2023-08-13 NOTE — Progress Notes (Signed)
 Pt arrived to unit via EMS/ stretcher. Pt alert and oriented with no c/o pain or discomfort. Pt ambulated independently to restroom and is now resting comfortably in bed with call bell and bedside table within reach. Bed is in lowest position with wheel locks engaged and alarm set. Will continue to monitor.

## 2023-08-13 NOTE — ED Triage Notes (Signed)
 Pt via pov from home with chest discomfort and sob today. He reports that he may have had a mini stroke yesterday - he had trouble picking up his razor. He was also off balance and was having fatigue. Pt has hx of stroke (noted on MRI but did not have symptoms). Pt alert & oriented, nad noted.

## 2023-08-13 NOTE — Telephone Encounter (Signed)
 Called and spoke to patient. DOB verified.  Patient's concerns:  -  While shaving yesterday morning, patient's right hand felt weird when picking up his razor. Denies weakness but felt "off".  - Felt very drowsy and "off balance".  - Body fell very drained.  - Pt denies slurred speech and facial drooping.  - Read article last night that mentioned mini strokes/TIAs.  - Laid down on the floor on his job with blanket for about an hour with minimal relief.  - BP yesterday: 160/80-85  - Pt states he feels fine today. BP 135/85.  - Had trouble sleeping for the past 1-2 weeks; only getting approx 4 hours of sleep a night.  - Would like to make Dr. Antoine Poche aware.  Nurse's Recommendations:  - Will make MD aware.  Patient verbalized understanding. Informed pt of ED protocol for worsening symptoms.

## 2023-08-13 NOTE — H&P (Signed)
 History and Physical    Kevin Webb WUX:[id number] DOB: 07-16-1945 DOA: 08/13/2023  PCP: Dana Allan, MD  Patient coming from: [facility] ED  Chief Complaint: Difficulty using right hand  HPI: Kevin Webb is a 78 y.o. male with medical history significant of CAD status post CABG, hypertension, hyperlipidemia, type 2 diabetes, TIA, BPH, GERD, alcohol use disorder, and other medical comorbidities presenting with a chief complaint of difficulty using his right hand.  Patient states on Thursday, 3/27 he was getting ready for work in the morning when he noticed that he had difficulty picking up the razor with his right hand.  He was eventually able to grasp the razor and shave.  He also had some blurry vision and difficulty with coordination/trouble walking at that time.  He thinks all of the symptoms lasted about 30 minutes and resolved and no recurrence since then.  He takes aspirin and Plavix daily.  Denies tobacco abuse.  Does report daily alcohol use.  He drinks 3-4 beers and 4 ounces of whiskey a day.  He reports history of CAD and chronic dyspnea on exertion.  Also sometimes experiences chest pain in the morning when he gets up.  He was having some mild chest discomfort when he presented to the ED but now resolved.  Denies any active chest pain/pressure or discomfort at this time.  No other complaints.  Denies fevers, chills, cough, nausea, vomiting, or abdominal pain.  ED Course: Patient was bradycardic with heart rate in the 40s to 50s but remainder of vital signs stable.  No significant lab abnormalities on CBC and BMP.  Troponin negative x 2.  EKG showing sinus bradycardia and no acute ischemic changes.  Chest x-ray showing no active cardiopulmonary disease.  CT head negative for acute intracranial abnormality.  CTA head and neck negative for LVO.  Brain MRI showing a 10 mm focus of acute infarct in the right central semiovale, additional punctate foci of acute infarct involving the right  precentral gyrus near the hand knob as well as the posterior right parietal cortex and lateral right temporal cortex.  Showing evidence of remote hemorrhage in the left parietal cortex area and chronic microhemorrhage in the left cerebellum. Neurology consulted by ED physician and recommended continuing aspirin and Plavix.  Review of Systems:  ROS  Past Medical History:  Diagnosis Date   Acute cholecystitis 04/18/2013   Adenomatous colon polyp 12/2004   Adenomatous colon polyp 12/16/2004   Anemia    Anemia    Blood transfusion without reported diagnosis    BPH associated with nocturia    CAD (coronary artery disease)    a. s/p CABG x4 in 2000 with LIMA-LAD, reverse SVG-IM, reverse SVG-acute margin and reverse SVG-RCA b. s/p BMS to SVG-RI in 2012 c. 12/2016: PCI/DES to SVG-RCA and PCI/DES to SVG-RI (Dr. Antoine Poche)    Carotid artery stenosis    mild    Cataract    Cervical radiculopathy 03/26/2020   Clotting disorder (HCC)    COLONIC POLYPS, HX OF 09/30/2006   Qualifier: Diagnosis of   By: Alwyn Ren MD, William          Polypectomy 2004, 2009; Dr Russella Dar         Coronary atherosclerosis of artery bypass graft 09/30/2006   Qualifier: Diagnosis of   By: Alwyn Ren MD, William       Diabetes mellitus without complication (HCC) 09/28/2016   recent A1C 6.1 12/25/16 controlled with diet and exercise    Diverticulitis 2008/2009   Diverticulitis  Dizziness 04/21/2020   Educated about COVID-19 virus infection 10/06/2018   GERD 02/22/2007   Qualifier: Diagnosis of   By: Alwyn Ren MD, Chrissie Noa       GERD (gastroesophageal reflux disease)    controlled as of 04/01/17    HSV infection    HTN (hypertension)    HTN (hypertension) 09/30/2006   Qualifier: Diagnosis of   By: Alwyn Ren MD, William       Hx of CABG 12/25/2020   Hx of dysplastic nevus 05/04/2018   upper back spinal    Hyperglycemia    Hyperlipidemia    Hyperlipidemia LDL goal <70 01/08/2017   Impingement syndrome of left shoulder 2016    Impingement syndrome of left shoulder 05/18/2014   Kidney cysts 06/20/2019   Low back pain 02/15/2019   Low testosterone    Dr Evelene Croon, [facility] ,[place]   Myocardial infarction [facility])    NSTEMI (non-ST elevated myocardial infarction) (HCC) 12/25/2016   Osteoarthritis of right knee    With trace effusion   Peyronie disease    Piriformis syndrome of right side 09/22/2019   Prediabetes 03/29/2018   Prostatitis 12/23/2017   Right arm weakness 02/02/2020   RLS (restless legs syndrome) 08/15/2013   RLS (restless legs syndrome) 08/15/2013   Rotator cuff injury    right; at 74-74 years old.    Sinus bradycardia    Sinus bradycardia    Thrombocytopenia (HCC)    TIA (transient ischemic attack) 01/19/2020   Tubular adenoma of colon 06/26/2020   Type 2 diabetes mellitus without complication, without long-term current use of insulin (HCC) 09/28/2016   Unstable angina (HCC) 12/01/2019   Vitamin D deficiency     Past Surgical History:  Procedure Laterality Date   CHOLECYSTECTOMY N/A 04/21/2013   Procedure: LAPAROSCOPIC CHOLECYSTECTOMY WITH INTRAOPERATIVE CHOLANGIOGRAM;  Surgeon: Currie Paris, MD;  Location: [facility] OR;  Service: General;  Laterality: N/A;   CHOLECYSTECTOMY     COLONOSCOPY W/ POLYPECTOMY  2004 & 2009    X 2; Dr Russella Dar; due 2014   CORONARY ANGIOPLASTY     CORONARY ARTERY BYPASS GRAFT  04/1999   4 vessels   CORONARY STENT INTERVENTION N/A 12/28/2016   Procedure: CORONARY STENT INTERVENTION;  Surgeon: Runell Gess, MD;  Location: [facility] INVASIVE CV LAB;  Service: Cardiovascular;  Laterality: N/A;   CORONARY STENT PLACEMENT  2012   Plavix   LAPAROSCOPIC CHOLECYSTECTOMY  04/21/2013   Dr Jamey Ripa; acutecholecystitis with necrosis   LEFT HEART CATH AND CORS/GRAFTS ANGIOGRAPHY N/A 12/28/2016   Procedure: LEFT HEART CATH AND CORS/GRAFTS ANGIOGRAPHY;  Surgeon: Runell Gess, MD;  Location: [facility] INVASIVE CV LAB;  Service: Cardiovascular;  Laterality: N/A;   LEFT HEART CATH AND CORS/GRAFTS  ANGIOGRAPHY N/A 12/01/2019   Procedure: LEFT HEART CATH AND CORS/GRAFTS ANGIOGRAPHY;  Surgeon: [place], Peter M, MD;  [facility] INVASIVE CV LAB;  Service: Cardiovascular;  Laterality: N/A;   VASECTOMY       reports that he has never smoked. He has never used smokeless tobacco. He reports current alcohol use of about 10.0 - 12.0 standard drinks of alcohol per week. He reports that he does not use drugs.  Allergies  Allergen Reactions   Darvocet [Propoxyphene N-Acetaminophen] Hives   Propoxyphene Hives    Family History  Problem Relation Age of Onset   Coronary artery disease Mother        CABG in 66s   CVA Mother        cns bleed from warfarin   Aneurysm Mother  Aortic aneurysm Father 13       ? abdominal   Heart attack Father 75   Cholecystitis Sister    Diabetes Brother    Diabetes Other        PGaunt   Cancer Neg Hx    Colon cancer Neg Hx    Prostate cancer Neg Hx    Esophageal cancer Neg Hx    Stomach cancer Neg Hx    Rectal cancer Neg Hx     Prior to Admission medications   Medication Sig Start Date End Date Taking? Authorizing Provider  amLODipine (NORVASC) 5 MG tablet Take 1 tablet (5 mg total) by mouth daily. 05/12/23   Dana Allan, MD  Ascorbic Acid (VITAMIN C) 1000 MG tablet Take 1,000 mg by mouth daily at 2 am. Reported on 09/26/2015    [provider]  aspirin EC 81 MG tablet Take 81 mg by mouth daily at 2 am. Swallow whole.    [provider]  celecoxib (CELEBREX) 50 MG capsule Take 1 capsule (50 mg total) by mouth 2 (two) times daily. 05/27/23   Michiel Cowboy A, PA-C  cholecalciferol (VITAMIN D) 25 MCG (1000 UNIT) tablet Take 1,000 Units by mouth daily at 2 am.    [provider]  clopidogrel (PLAVIX) 75 MG tablet TAKE 1 TABLET BY MOUTH DAILY 11/06/22   Dana Allan, MD  Coenzyme Q10 (CO Q-10 PO) Take 1 capsule by mouth daily at 2 am.    [provider]  dicyclomine (BENTYL) 10 MG capsule Take 1 capsule (10 mg total) by  mouth 3 (three) times daily before meals. 04/06/23   Meryl Dare, MD  dutasteride (AVODART) 0.5 MG capsule Take 1 capsule (0.5 mg total) by mouth daily. 07/13/23   Michiel Cowboy A, PA-C  ezetimibe (ZETIA) 10 MG tablet TAKE 1 TABLET BY MOUTH DAILY 11/06/22   Dana Allan, MD  [facility] BILOBA PO Take 1 capsule by mouth daily at 2 am.     [provider]  isosorbide mononitrate (IMDUR) 120 MG 24 hr tablet Take 1 tablet (120 mg total) by mouth daily. 10/02/22   Alver Sorrow, NP  MAGNESIUM PO Take 1 tablet by mouth daily at 2 am.    [provider]  metoprolol succinate (TOPROL-XL) 25 MG 24 hr tablet Take 1 tablet (25 mg total) by mouth daily. 05/12/23   Dana Allan, MD  Multiple Vitamin (MULTIVITAMIN WITH MINERALS) TABS tablet Take 1 tablet by mouth daily at 2 am.    [provider]  niacin 250 MG tablet Take 250 mg by mouth daily.    [provider]  nitroGLYCERIN (NITROSTAT) 0.4 MG SL tablet Place 1 tablet (0.4 mg total) under the tongue every 5 (five) minutes x 3 doses as needed. 10/28/21   Rollene Rotunda, MD  Omega-3 Fatty Acids (FISH OIL) 1000 MG CAPS Take 2,000 mg by mouth daily at 2 am.     [provider]  pantoprazole (PROTONIX) 40 MG tablet Take 1 tablet (40 mg total) by mouth daily. 04/06/23   Meryl Dare, MD  ramipril (ALTACE) 10 MG capsule Take 1 capsule (10 mg total) by mouth daily. 09/15/22   Dana Allan, MD  rosuvastatin (CRESTOR) 40 MG tablet TAKE 1 TABLET AT BEDTIME 11/06/22   Dana Allan, MD  tamsulosin [facility]) 0.4 MG CAPS capsule Take 1 capsule (0.4 mg total) by mouth daily at 2 am. 07/13/23   Michiel Cowboy A, PA-C  vitamin E 100 UNIT capsule  Take 100 Units by mouth daily at 2 am.     [provider]    Physical Exam: Vitals:   08/13/23 1600 08/13/23 1832 08/13/23 2100 08/13/23 2102  BP: 132/72 133/70  134/77  Pulse: (!) 49 (!) 54  (!) 59  Resp: 14 18  18   Temp:  97.8 F (36.6 C)  97.7 F (36.5 C)   TempSrc:  Oral  Oral  SpO2: 99% 99%  98%  Weight:   89.5 kg   Height:   5\' 9"  (1.753 m)     Physical Exam  Labs on Admission: I have personally reviewed following labs and imaging studies  CBC: Recent Labs  Lab 08/13/23 1306  WBC 6.3  HGB 14.0  HCT 41.9  MCV 91.1  PLT 151   Basic Metabolic Panel: Recent Labs  Lab 08/13/23 1306  NA 137  K 4.0  CL 103  CO2 27  GLUCOSE 169*  BUN 10  CREATININE 0.86  CALCIUM 8.8*   GFR: Estimated Creatinine Clearance: 82.1 mL/min (by C-G formula based on SCr of 0.86 mg/dL). Liver Function Tests: No results for input(s): "AST", "ALT", "ALKPHOS", "BILITOT", "PROT", "ALBUMIN" in the last 168 hours. No results for input(s): "LIPASE", "AMYLASE" in the last 168 hours. No results for input(s): "AMMONIA" in the last 168 hours. Coagulation Profile: No results for input(s): "INR", "PROTIME" in the last 168 hours. Cardiac Enzymes: No results for input(s): "CKTOTAL", "CKMB", "CKMBINDEX", "TROPONINI" in the last 168 hours. BNP (last 3 results) No results for input(s): "PROBNP" in the last 8760 hours. HbA1C: No results for input(s): "HGBA1C" in the last 72 hours. CBG: No results for input(s): "GLUCAP" in the last 168 hours. Lipid Profile: No results for input(s): "CHOL", "HDL", "LDLCALC", "TRIG", "CHOLHDL", "LDLDIRECT" in the last 72 hours. Thyroid Function Tests: No results for input(s): "TSH", "T4TOTAL", "FREET4", "T3FREE", "THYROIDAB" in the last 72 hours. Anemia Panel: No results for input(s): "VITAMINB12", "FOLATE", "FERRITIN", "TIBC", "IRON", "RETICCTPCT" in the last 72 hours. Urine analysis:    Component Value Date/Time   COLORURINE YELLOW 05/05/2023 1201   APPEARANCEUR Clear 05/27/2023 1459   LABSPEC 1.010 05/05/2023 1201   PHURINE 7.5 05/05/2023 1201   GLUCOSEU Negative 05/27/2023 1459   GLUCOSEU NEGATIVE 05/05/2023 1201   HGBUR NEGATIVE 05/05/2023 1201   BILIRUBINUR Negative 05/27/2023 1459   KETONESUR NEGATIVE 05/05/2023  1201   PROTEINUR Negative 05/27/2023 1459   PROTEINUR NEGATIVE 11/11/2021 1004   UROBILINOGEN 0.2 05/05/2023 1201   NITRITE Negative 05/27/2023 1459   NITRITE NEGATIVE 05/05/2023 1201   LEUKOCYTESUR Negative 05/27/2023 1459   LEUKOCYTESUR NEGATIVE 05/05/2023 1201    Radiological Exams on Admission: MR BRAIN WO CONTRAST Result Date: 08/13/2023 CLINICAL DATA:  Neuro deficit, weakness notice yesterday, concern for stroke. EXAM: MRI HEAD WITHOUT CONTRAST TECHNIQUE: Multiplanar, multiecho pulse sequences of the brain and surrounding structures were obtained without intravenous contrast. COMPARISON:  CTA head and neck earlier same day. MRI head 01/19/2020. FINDINGS: Brain: There is a 10 mm focus of restricted diffusion in the right centrum semiovale compatible with acute infarct. Additional punctate foci of acute infarct involving the right precentral gyrus in the region of the hand knob, posterior parasagittal right parietal cortex, and lateral right temporal cortex. Curvilinear susceptibility along the left parietal cortex suggestive of remote hemorrhage. No associated edema or mass effect. No findings to suggest acute intracranial hemorrhage. Scattered signal abnormality in the periventricular and subcortical white matter. Remote lacunar infarcts in the bilateral centrum semiovale. Encephalomalacia and gliosis in the left  middle frontal gyrus compatible with remote infarct. Additional small remote right parietal cortical infarct. Remote lacunar infarct in the left thalamus. Chronic microhemorrhage in the left cerebellum. Normal appearance of midline structures. The basilar cisterns are patent. No extra-axial fluid collections. Ventricles: Prominence of the lateral ventricles suggestive of underlying parenchymal volume loss. Vascular: Skull base flow voids are visualized. Skull and upper cervical spine: No focal abnormality. Sinuses/Orbits: Mucosal thickening in the ethmoid and maxillary sinuses. Other:  Mastoid air cells are clear. IMPRESSION: 10 mm focus of acute infarct in the right centrum semiovale. Additional punctate foci of acute infarct involving the right precentral gyrus near the hand knob as well as the posterior right parietal cortex and lateral right temporal cortex. Nonspecific white matter signal abnormality likely reflecting mild chronic microvascular ischemic changes. Mild parenchymal volume loss. Multiple remote infarcts as above. Susceptibility over the left parietal cortex is new since 2021 suggestive of remote hemorrhage. Chronic microhemorrhage again noted in the left cerebellum. Electronically Signed   By: Emily Filbert M.D.   On: 08/13/2023 18:52   CT ANGIO HEAD NECK W WO CM Result Date: 08/13/2023 CLINICAL DATA:  Neuro deficit, concern for stroke, episodes of right hand weakness yesterday. EXAM: CT ANGIOGRAPHY HEAD AND NECK WITH AND WITHOUT CONTRAST TECHNIQUE: Multidetector CT imaging of the head and neck was performed using the standard protocol during bolus administration of intravenous contrast. Multiplanar CT image reconstructions and MIPs were obtained to evaluate the vascular anatomy. Carotid stenosis measurements (when applicable) are obtained utilizing NASCET criteria, using the distal internal carotid diameter as the denominator. RADIATION DOSE REDUCTION: This exam was performed according to the departmental dose-optimization program which includes automated exposure control, adjustment of the mA and/or kV according to patient size and/or use of iterative reconstruction technique. CONTRAST:  75mL OMNIPAQUE IOHEXOL 350 MG/ML SOLN COMPARISON:  CTA head and neck 01/19/2020. FINDINGS: CT HEAD FINDINGS Brain: No acute intracranial hemorrhage. No CT evidence of acute infarct. Nonspecific hypoattenuation in the periventricular and subcortical white matter favored to reflect chronic microvascular ischemic changes. Redemonstrated remote lacunar infarct in the left thalamus. No edema,  mass effect, or midline shift. The basilar cisterns are patent. Ventricles: Ventricles are normal in size and configuration. Vascular: No hyperdense vessel. Redemonstrated atherosclerosis involving the carotid siphons and intracranial vertebral arteries. Additional focal atherosclerosis along the left M1 segment. Skull: No acute or aggressive finding. Sinuses/orbits: Orbits are symmetric. Mucosal thickening and possible mucous retention cyst in the left maxillary sinus. Other: Mastoid air cells are clear. CTA NECK FINDINGS Aortic arch: Standard configuration of the aortic arch. Imaged portion shows no evidence of aneurysm or dissection. Mild atherosclerosis of the aortic arch. Sequelae of CABG. No significant stenosis of the major arch vessel origins. Pulmonary arteries: As permitted by contrast timing, there are no filling defects in the visualized pulmonary arteries. Subclavian arteries: The subclavian arteries are patent bilaterally. Right carotid system: Patent. Mild atherosclerosis along the common carotid artery. Bulky calcified atherosclerosis an additional noncalcified atherosclerotic plaque involving the carotid bifurcation extending into the carotid bulb. There is approximately 65% stenosis at the origin of the right cervical ICA. External carotid artery branches are patent. No evidence of dissection. Left carotid system: Patent. Mild atherosclerosis along the common carotid artery. Bulky calcified atherosclerosis with additional component of noncalcified atherosclerotic plaque at the carotid bifurcation extending into the carotid bulb. Approximately 60% stenosis at the origin of the left cervical ICA. Left EAC branches are patent. Vertebral arteries: Codominant. Patent from the origins to the vertebrobasilar confluence.  Atherosclerosis involving the vertebral artery origins resulting in moderate stenosis on the right and severe stenosis on the left. Additional atherosclerosis of the left V3 segment and  bilateral V4 segments. There is moderate focal stenosis of the proximal right V4 segment and mild stenosis of the left V4 segment. No evidence of dissection, stenosis (50% or greater), or occlusion. Skeleton: No acute findings. Degenerative changes in the cervical spine. Sequelae of median sternotomy. Other neck: The visualized airway is patent. No cervical lymphadenopathy. Upper chest: Visualized lung apices are clear. Review of the MIP images confirms the above findings CTA HEAD FINDINGS ANTERIOR CIRCULATION: The intracranial ICAs are patent bilaterally. Atherosclerosis of the bilateral carotid siphons. Mild stenosis of the bilateral cavernous ICAs. Moderate stenosis of the bilateral supraclinoid ICAs slightly greater on the left. No proximal occlusion, aneurysm, or vascular malformation. MCAs: The middle cerebral arteries are patent bilaterally. ACAs: The anterior cerebral arteries are patent bilaterally. POSTERIOR CIRCULATION: No significant stenosis, proximal occlusion, aneurysm, or vascular malformation. PCAs: The posterior cerebral arteries are patent bilaterally. Pcomm: Not well visualized. SCAs: The superior cerebellar arteries are patent bilaterally. Basilar artery: Patent AICAs: Visualized on the left. PICAs: Visualized bilaterally more pronounced on the right. Vertebral arteries: The intracranial vertebral arteries are patent. Venous sinuses: As permitted by contrast timing, patent. Anatomic variants: None Review of the MIP images confirms the above findings IMPRESSION: No large vessel occlusion. No CT evidence of acute intracranial abnormality. Focal moderate stenosis of the V4 segment right vertebral artery. Moderate stenosis of the bilateral supraclinoid ICAs. Atherosclerosis in the neck as above. Approximately 65% stenosis at the origin of the right cervical ICA and 60% stenosis at the origin of the left cervical ICA. Stenosis at the origins of the vertebral arteries, severe on the left and moderate  on the right. Remote lacunar infarct in the left thalamus. Electronically Signed   By: Emily Filbert M.D.   On: 08/13/2023 17:19   DG Chest 2 View Result Date: 08/13/2023 CLINICAL DATA:  Chest pain EXAM: CHEST - 2 VIEW COMPARISON:  Chest radiograph dated 12/30/2022 FINDINGS: Normal lung volumes. No focal consolidations. No pleural effusion or pneumothorax. Similar postsurgical cardiomediastinal silhouette. Median sternotomy wires are nondisplaced. IMPRESSION: No active cardiopulmonary disease. Electronically Signed   By: Agustin Cree M.D.   On: 08/13/2023 14:32    Assessment and Plan  Acute CVA Patient presenting with complaints of right hand weakness/numbness/difficulty grasping objects, blurry vision, and difficulty with coordination/trouble walking on 3/27 in the morning and symptoms resolved in about 30 minutes.  Neuroexam currently nonfocal. CT head negative for acute intracranial abnormality.  CTA head and neck negative for LVO.  Brain MRI showing a 10 mm focus of acute infarct in the right central semiovale, additional punctate foci of acute infarct involving the right precentral gyrus near the hand knob as well as the posterior right parietal cortex and lateral right temporal cortex.  Showing evidence of remote hemorrhage in the left parietal cortex area and chronic microhemorrhage in the left cerebellum. Neurology consulted by ED physician and recommended continuing aspirin and Plavix. -Appreciate neurology recommendations -Continue aspirin and Plavix -Telemetry monitoring -Echocardiogram -Hemoglobin A1c, fasting lipid panel -Frequent neurochecks -PT, OT, speech therapy -N.p.o. until cleared by bedside swallow evaluation or formal speech evaluation  Sinus bradycardia Heart rate in the 40s to 50s.  Blood pressure stable.  Hold home metoprolol at this time and continue cardiac monitoring.  Chest pain Patient is no longer having active chest pain at this time.  Chest x-ray  showing no active  cardiopulmonary disease.  Troponin negative x 2 and EKG without acute ischemic changes.  PE less likely given no tachycardia or hypoxia.  Hypertension Allow permissive hypertension at this time and resume antihypertensives when okay with neurology.  Type 2 diabetes Last A1c 6.4 in December 2024, repeat ordered.  Placed on sensitive sliding scale insulin ACHS.  Alcohol use disorder No signs of withdrawal at this time.  Placed on CIWA protocol; Ativan as needed.  Thiamine, folate, and multivitamin.  CAD status post CABG Continue aspirin and Plavix.  Beta-blocker held due to bradycardia.  Continue statin after pharmacy med rec is done.  Hyperlipidemia BPH GERD Continue home medications after pharmacy med rec is done.  DVT prophylaxis: SCDs Code Status: Full Code (discussed with the patient) Family Communication: No family available at this time. Consults called: Neurology (Dr. Derry Lory) Level of care: Telemetry bed Admission status: It is my clinical opinion that admission to INPATIENT is reasonable and necessary because of the expectation that this patient will require hospital care that crosses at least 2 midnights to treat this condition based on the medical complexity of the problems presented.  Given the aforementioned information, the predictability of an adverse outcome is felt to be significant.  John Giovanni MD [facility]  If 7PM-7AM, please contact night-coverage [email or web address]  08/13/2023, 11:53 PM

## 2023-08-13 NOTE — H&P (Incomplete)
 History and Physical    DEMONTA WOMBLES WUJ:[id number] DOB: 12-Aug-1945 DOA: 08/13/2023  PCP: Dana Allan, MD  Patient coming from: [facility] ED  Chief Complaint: Left hand weakness/numbness.  HPI: Kevin Webb is a 78 y.o. male with medical history significant of CAD status post CABG, hypertension, hyperlipidemia, type 2 diabetes, TIA, BPH, GERD, alcohol use disorder, and other medical comorbidities presented to the ED with complaints of left hand numbness/weakness, chest pain, and shortness of breath.  In the ED, patient was bradycardic with heart rate in the 40s to 50s but remainder of vital signs stable.  CBC unremarkable.  BMP showing glucose 169  Neurology consulted by ED physician and patient was given aspirin and Plavix.  ED Course: ***  Review of Systems:  ROS  Past Medical History:  Diagnosis Date  . Acute cholecystitis 04/18/2013  . Adenomatous colon polyp 12/2004  . Adenomatous colon polyp 12/16/2004  . Anemia   . Anemia   . Blood transfusion without reported diagnosis   . BPH associated with nocturia   . CAD (coronary artery disease)    a. s/p CABG x4 in 2000 with LIMA-LAD, reverse SVG-IM, reverse SVG-acute margin and reverse SVG-RCA b. s/p BMS to SVG-RI in 2012 c. 12/2016: PCI/DES to SVG-RCA and PCI/DES to SVG-RI (Dr. Antoine Poche)   . Carotid artery stenosis    mild   . Cataract   . Cervical radiculopathy 03/26/2020  . Clotting disorder (HCC)   . COLONIC POLYPS, HX OF 09/30/2006   Qualifier: Diagnosis of   By: Alwyn Ren MD, Chrissie Noa          Polypectomy 2004, 2009; Dr Russella Dar        . Coronary atherosclerosis of artery bypass graft 09/30/2006   Qualifier: Diagnosis of   By: Alwyn Ren MD, Chrissie Noa      . Diabetes mellitus without complication (HCC) 09/28/2016   recent A1C 6.1 12/25/16 controlled with diet and exercise   . Diverticulitis 2008/2009  . Diverticulitis   . Dizziness 04/21/2020  . Educated about COVID-19 virus infection 10/06/2018  . GERD 02/22/2007   Qualifier:  Diagnosis of   By: Alwyn Ren MD, Chrissie Noa      . GERD (gastroesophageal reflux disease)    controlled as of 04/01/17   . HSV infection   . HTN (hypertension)   . HTN (hypertension) 09/30/2006   Qualifier: Diagnosis of   By: Alwyn Ren MD, William      . Hx of CABG 12/25/2020  . Hx of dysplastic nevus 05/04/2018   upper back spinal   . Hyperglycemia   . Hyperlipidemia   . Hyperlipidemia LDL goal <70 01/08/2017  . Impingement syndrome of left shoulder 2016  . Impingement syndrome of left shoulder 05/18/2014  . Kidney cysts 06/20/2019  . Low back pain 02/15/2019  . Low testosterone    Dr Evelene Croon, [place] ,[place]  . Myocardial infarction (HCC)   . NSTEMI (non-ST elevated myocardial infarction) (HCC) 12/25/2016  . Osteoarthritis of right knee    With trace effusion  . Peyronie disease   . Piriformis syndrome of right side 09/22/2019  . Prediabetes 03/29/2018  . Prostatitis 12/23/2017  . Right arm weakness 02/02/2020  . RLS (restless legs syndrome) 08/15/2013  . RLS (restless legs syndrome) 08/15/2013  . Rotator cuff injury    right; at 90-11 years old.   . Sinus bradycardia   . Sinus bradycardia   . Thrombocytopenia (HCC)   . TIA (transient ischemic attack) 01/19/2020  . Tubular adenoma of colon 06/26/2020  .  Type 2 diabetes mellitus without complication, without long-term current use of insulin (HCC) 09/28/2016  . Unstable angina (HCC) 12/01/2019  . Vitamin D deficiency     Past Surgical History:  Procedure Laterality Date  . CHOLECYSTECTOMY N/A 04/21/2013   Procedure: LAPAROSCOPIC CHOLECYSTECTOMY WITH INTRAOPERATIVE CHOLANGIOGRAM;  Surgeon: Currie Paris, MD;  Location: [facility] OR;  Service: General;  Laterality: N/A;  . CHOLECYSTECTOMY    . COLONOSCOPY W/ POLYPECTOMY  2004 & 2009    X 2; Dr Russella Dar; due 2014  . CORONARY ANGIOPLASTY    . CORONARY ARTERY BYPASS GRAFT  04/1999   4 vessels  . CORONARY STENT INTERVENTION N/A 12/28/2016   Procedure: CORONARY STENT INTERVENTION;   Surgeon: Runell Gess, MD;  Location: [facility] INVASIVE CV LAB;  Service: Cardiovascular;  Laterality: N/A;  . CORONARY STENT PLACEMENT  2012   Plavix  . LAPAROSCOPIC CHOLECYSTECTOMY  04/21/2013   Dr Jamey Ripa; acutecholecystitis with necrosis  . LEFT HEART CATH AND CORS/GRAFTS ANGIOGRAPHY N/A 12/28/2016   Procedure: LEFT HEART CATH AND CORS/GRAFTS ANGIOGRAPHY;  Surgeon: Runell Gess, MD;  Location: [facility] INVASIVE CV LAB;  Service: Cardiovascular;  Laterality: N/A;  . LEFT HEART CATH AND CORS/GRAFTS ANGIOGRAPHY N/A 12/01/2019   Procedure: LEFT HEART CATH AND CORS/GRAFTS ANGIOGRAPHY;  Surgeon: [place], Peter M, MD;  Location: [facility] INVASIVE CV LAB;  Service: Cardiovascular;  Laterality: N/A;  . VASECTOMY       reports that he has never smoked. He has never used smokeless tobacco. He reports current alcohol use of about 10.0 - 12.0 standard drinks of alcohol per week. He reports that he does not use drugs.  Allergies  Allergen Reactions  . Darvocet [Propoxyphene N-Acetaminophen] Hives  . Propoxyphene Hives    Family History  Problem Relation Age of Onset  . Coronary artery disease Mother        CABG in 29s  . CVA Mother        cns bleed from warfarin  . Aneurysm Mother   . Aortic aneurysm Father 60       ? abdominal  . Heart attack Father 65  . Cholecystitis Sister   . Diabetes Brother   . Diabetes Other        PGaunt  . Cancer Neg Hx   . Colon cancer Neg Hx   . Prostate cancer Neg Hx   . Esophageal cancer Neg Hx   . Stomach cancer Neg Hx   . Rectal cancer Neg Hx     Prior to Admission medications   Medication Sig Start Date End Date Taking? Authorizing Provider  amLODipine (NORVASC) 5 MG tablet Take 1 tablet (5 mg total) by mouth daily. 05/12/23   Dana Allan, MD  Ascorbic Acid (VITAMIN C) 1000 MG tablet Take 1,000 mg by mouth daily at 2 am. Reported on 09/26/2015    [provider]  aspirin EC 81 MG tablet Take 81 mg by mouth daily at 2 am. Swallow whole.    [provider]  celecoxib (CELEBREX) 50 MG capsule Take 1 capsule (50 mg total) by mouth 2 (two) times daily. 05/27/23   Michiel Cowboy A, PA-C  cholecalciferol (VITAMIN D) 25 MCG (1000 UNIT) tablet Take 1,000 Units by mouth daily at 2 am.    [provider]  clopidogrel (PLAVIX) 75 MG tablet TAKE 1 TABLET BY MOUTH DAILY 11/06/22   Dana Allan, MD  Coenzyme Q10 (CO Q-10 PO) Take 1 capsule by mouth daily at 2 am.    [provider]  dicyclomine (BENTYL) 10 MG capsule Take 1 capsule (10 mg total) by mouth 3 (three) times daily before meals. 04/06/23   Meryl Dare, MD  dutasteride (AVODART) 0.5 MG capsule Take 1 capsule (0.5 mg total) by mouth daily. 07/13/23   Michiel Cowboy A, PA-C  ezetimibe (ZETIA) 10 MG tablet TAKE 1 TABLET BY MOUTH DAILY 11/06/22   Dana Allan, MD  [facility] BILOBA PO Take 1 capsule by mouth daily at 2 am.     [provider]  isosorbide mononitrate (IMDUR) 120 MG 24 hr tablet Take 1 tablet (120 mg total) by mouth daily. 10/02/22   Alver Sorrow, NP  MAGNESIUM PO Take 1 tablet by mouth daily at 2 am.    [provider]  metoprolol succinate (TOPROL-XL) 25 MG 24 hr tablet Take 1 tablet (25 mg total) by mouth daily. 05/12/23   Dana Allan, MD  Multiple Vitamin (MULTIVITAMIN WITH MINERALS) TABS tablet Take 1 tablet by mouth daily at 2 am.    [provider]  niacin 250 MG tablet Take 250 mg by mouth daily.    [provider]  nitroGLYCERIN (NITROSTAT) 0.4 MG SL tablet Place 1 tablet (0.4 mg total) under the tongue every 5 (five) minutes x 3 doses as needed. 10/28/21   Rollene Rotunda, MD  Omega-3 Fatty Acids (FISH OIL) 1000 MG CAPS Take 2,000 mg by mouth daily at 2 am.     [provider]  pantoprazole (PROTONIX) 40 MG tablet Take 1 tablet (40 mg total) by mouth daily. 04/06/23   Meryl Dare, MD  ramipril (ALTACE) 10 MG capsule Take 1 capsule (10 mg total) by mouth daily. 09/15/22   Dana Allan, MD   rosuvastatin (CRESTOR) 40 MG tablet TAKE 1 TABLET AT BEDTIME 11/06/22   Dana Allan, MD  tamsulosin [facility]) 0.4 MG CAPS capsule Take 1 capsule (0.4 mg total) by mouth daily at 2 am. 07/13/23   Michiel Cowboy A, PA-C  vitamin E 100 UNIT capsule Take 100 Units by mouth daily at 2 am.     [provider]    Physical Exam: Vitals:   08/13/23 1600 08/13/23 1832 08/13/23 2100 08/13/23 2102  BP: 132/72 133/70  134/77  Pulse: (!) 49 (!) 54  (!) 59  Resp: 14 18  18   Temp:  97.8 F (36.6 C)  97.7 F (36.5 C)  TempSrc:  Oral  Oral  SpO2: 99% 99%  98%  Weight:   89.5 kg   Height:   5\' 9"  (1.753 m)     Physical Exam  Labs on Admission: I have personally reviewed following labs and imaging studies  CBC: Recent Labs  Lab 08/13/23 1306  WBC 6.3  HGB 14.0  HCT 41.9  MCV 91.1  PLT 151   Basic Metabolic Panel: Recent Labs  Lab 08/13/23 1306  NA 137  K 4.0  CL 103  CO2 27  GLUCOSE 169*  BUN 10  CREATININE 0.86  CALCIUM 8.8*   GFR: Estimated Creatinine Clearance: 82.1 mL/min (by C-G formula based on SCr of 0.86 mg/dL). Liver Function Tests: No results for input(s): "AST", "ALT", "ALKPHOS", "BILITOT", "PROT", "ALBUMIN" in the last 168 hours. No results for input(s): "LIPASE", "AMYLASE" in the last 168 hours. No results for input(s): "AMMONIA" in the last 168 hours. Coagulation Profile: No results for input(s): "INR", "PROTIME" in the last 168 hours. Cardiac Enzymes: No results for input(s): "CKTOTAL", "CKMB", "CKMBINDEX", "TROPONINI" in the last 168 hours. BNP (last  3 results) No results for input(s): "PROBNP" in the last 8760 hours. HbA1C: No results for input(s): "HGBA1C" in the last 72 hours. CBG: No results for input(s): "GLUCAP" in the last 168 hours. Lipid Profile: No results for input(s): "CHOL", "HDL", "LDLCALC", "TRIG", "CHOLHDL", "LDLDIRECT" in the last 72 hours. Thyroid Function Tests: No results for input(s): "TSH", "T4TOTAL", "FREET4", "T3FREE",  "THYROIDAB" in the last 72 hours. Anemia Panel: No results for input(s): "VITAMINB12", "FOLATE", "FERRITIN", "TIBC", "IRON", "RETICCTPCT" in the last 72 hours. Urine analysis:    Component Value Date/Time   COLORURINE YELLOW 05/05/2023 1201   APPEARANCEUR Clear 05/27/2023 1459   LABSPEC 1.010 05/05/2023 1201   PHURINE 7.5 05/05/2023 1201   GLUCOSEU Negative 05/27/2023 1459   GLUCOSEU NEGATIVE 05/05/2023 1201   HGBUR NEGATIVE 05/05/2023 1201   BILIRUBINUR Negative 05/27/2023 1459   KETONESUR NEGATIVE 05/05/2023 1201   PROTEINUR Negative 05/27/2023 1459   PROTEINUR NEGATIVE 11/11/2021 1004   UROBILINOGEN 0.2 05/05/2023 1201   NITRITE Negative 05/27/2023 1459   NITRITE NEGATIVE 05/05/2023 1201   LEUKOCYTESUR Negative 05/27/2023 1459   LEUKOCYTESUR NEGATIVE 05/05/2023 1201    Radiological Exams on Admission: MR BRAIN WO CONTRAST Result Date: 08/13/2023 CLINICAL DATA:  Neuro deficit, weakness notice yesterday, concern for stroke. EXAM: MRI HEAD WITHOUT CONTRAST TECHNIQUE: Multiplanar, multiecho pulse sequences of the brain and surrounding structures were obtained without intravenous contrast. COMPARISON:  CTA head and neck earlier same day. MRI head 01/19/2020. FINDINGS: Brain: There is a 10 mm focus of restricted diffusion in the right centrum semiovale compatible with acute infarct. Additional punctate foci of acute infarct involving the right precentral gyrus in the region of the hand knob, posterior parasagittal right parietal cortex, and lateral right temporal cortex. Curvilinear susceptibility along the left parietal cortex suggestive of remote hemorrhage. No associated edema or mass effect. No findings to suggest acute intracranial hemorrhage. Scattered signal abnormality in the periventricular and subcortical white matter. Remote lacunar infarcts in the bilateral centrum semiovale. Encephalomalacia and gliosis in the left middle frontal gyrus compatible with remote infarct. Additional  small remote right parietal cortical infarct. Remote lacunar infarct in the left thalamus. Chronic microhemorrhage in the left cerebellum. Normal appearance of midline structures. The basilar cisterns are patent. No extra-axial fluid collections. Ventricles: Prominence of the lateral ventricles suggestive of underlying parenchymal volume loss. Vascular: Skull base flow voids are visualized. Skull and upper cervical spine: No focal abnormality. Sinuses/Orbits: Mucosal thickening in the ethmoid and maxillary sinuses. Other: Mastoid air cells are clear. IMPRESSION: 10 mm focus of acute infarct in the right centrum semiovale. Additional punctate foci of acute infarct involving the right precentral gyrus near the hand knob as well as the posterior right parietal cortex and lateral right temporal cortex. Nonspecific white matter signal abnormality likely reflecting mild chronic microvascular ischemic changes. Mild parenchymal volume loss. Multiple remote infarcts as above. Susceptibility over the left parietal cortex is new since 2021 suggestive of remote hemorrhage. Chronic microhemorrhage again noted in the left cerebellum. Electronically Signed   By: Emily Filbert M.D.   On: 08/13/2023 18:52   CT ANGIO HEAD NECK W WO CM Result Date: 08/13/2023 CLINICAL DATA:  Neuro deficit, concern for stroke, episodes of right hand weakness yesterday. EXAM: CT ANGIOGRAPHY HEAD AND NECK WITH AND WITHOUT CONTRAST TECHNIQUE: Multidetector CT imaging of the head and neck was performed using the standard protocol during bolus administration of intravenous contrast. Multiplanar CT image reconstructions and MIPs were obtained to evaluate the vascular anatomy. Carotid stenosis measurements (when applicable)  are obtained utilizing NASCET criteria, using the distal internal carotid diameter as the denominator. RADIATION DOSE REDUCTION: This exam was performed according to the departmental dose-optimization program which includes automated  exposure control, adjustment of the mA and/or kV according to patient size and/or use of iterative reconstruction technique. CONTRAST:  75mL OMNIPAQUE IOHEXOL 350 MG/ML SOLN COMPARISON:  CTA head and neck 01/19/2020. FINDINGS: CT HEAD FINDINGS Brain: No acute intracranial hemorrhage. No CT evidence of acute infarct. Nonspecific hypoattenuation in the periventricular and subcortical white matter favored to reflect chronic microvascular ischemic changes. Redemonstrated remote lacunar infarct in the left thalamus. No edema, mass effect, or midline shift. The basilar cisterns are patent. Ventricles: Ventricles are normal in size and configuration. Vascular: No hyperdense vessel. Redemonstrated atherosclerosis involving the carotid siphons and intracranial vertebral arteries. Additional focal atherosclerosis along the left M1 segment. Skull: No acute or aggressive finding. Sinuses/orbits: Orbits are symmetric. Mucosal thickening and possible mucous retention cyst in the left maxillary sinus. Other: Mastoid air cells are clear. CTA NECK FINDINGS Aortic arch: Standard configuration of the aortic arch. Imaged portion shows no evidence of aneurysm or dissection. Mild atherosclerosis of the aortic arch. Sequelae of CABG. No significant stenosis of the major arch vessel origins. Pulmonary arteries: As permitted by contrast timing, there are no filling defects in the visualized pulmonary arteries. Subclavian arteries: The subclavian arteries are patent bilaterally. Right carotid system: Patent. Mild atherosclerosis along the common carotid artery. Bulky calcified atherosclerosis an additional noncalcified atherosclerotic plaque involving the carotid bifurcation extending into the carotid bulb. There is approximately 65% stenosis at the origin of the right cervical ICA. External carotid artery branches are patent. No evidence of dissection. Left carotid system: Patent. Mild atherosclerosis along the common carotid artery. Bulky  calcified atherosclerosis with additional component of noncalcified atherosclerotic plaque at the carotid bifurcation extending into the carotid bulb. Approximately 60% stenosis at the origin of the left cervical ICA. Left EAC branches are patent. Vertebral arteries: Codominant. Patent from the origins to the vertebrobasilar confluence. Atherosclerosis involving the vertebral artery origins resulting in moderate stenosis on the right and severe stenosis on the left. Additional atherosclerosis of the left V3 segment and bilateral V4 segments. There is moderate focal stenosis of the proximal right V4 segment and mild stenosis of the left V4 segment. No evidence of dissection, stenosis (50% or greater), or occlusion. Skeleton: No acute findings. Degenerative changes in the cervical spine. Sequelae of median sternotomy. Other neck: The visualized airway is patent. No cervical lymphadenopathy. Upper chest: Visualized lung apices are clear. Review of the MIP images confirms the above findings CTA HEAD FINDINGS ANTERIOR CIRCULATION: The intracranial ICAs are patent bilaterally. Atherosclerosis of the bilateral carotid siphons. Mild stenosis of the bilateral cavernous ICAs. Moderate stenosis of the bilateral supraclinoid ICAs slightly greater on the left. No proximal occlusion, aneurysm, or vascular malformation. MCAs: The middle cerebral arteries are patent bilaterally. ACAs: The anterior cerebral arteries are patent bilaterally. POSTERIOR CIRCULATION: No significant stenosis, proximal occlusion, aneurysm, or vascular malformation. PCAs: The posterior cerebral arteries are patent bilaterally. Pcomm: Not well visualized. SCAs: The superior cerebellar arteries are patent bilaterally. Basilar artery: Patent AICAs: Visualized on the left. PICAs: Visualized bilaterally more pronounced on the right. Vertebral arteries: The intracranial vertebral arteries are patent. Venous sinuses: As permitted by contrast timing, patent.  Anatomic variants: None Review of the MIP images confirms the above findings IMPRESSION: No large vessel occlusion. No CT evidence of acute intracranial abnormality. Focal moderate stenosis of the V4 segment right vertebral  artery. Moderate stenosis of the bilateral supraclinoid ICAs. Atherosclerosis in the neck as above. Approximately 65% stenosis at the origin of the right cervical ICA and 60% stenosis at the origin of the left cervical ICA. Stenosis at the origins of the vertebral arteries, severe on the left and moderate on the right. Remote lacunar infarct in the left thalamus. Electronically Signed   By: Emily Filbert M.D.   On: 08/13/2023 17:19   DG Chest 2 View Result Date: 08/13/2023 CLINICAL DATA:  Chest pain EXAM: CHEST - 2 VIEW COMPARISON:  Chest radiograph dated 12/30/2022 FINDINGS: Normal lung volumes. No focal consolidations. No pleural effusion or pneumothorax. Similar postsurgical cardiomediastinal silhouette. Median sternotomy wires are nondisplaced. IMPRESSION: No active cardiopulmonary disease. Electronically Signed   By: Agustin Cree M.D.   On: 08/13/2023 14:32    EKG: Independently reviewed. ***  Assessment and Plan  DVT prophylaxis: {Blank single:19197::"Lovenox","SQ Heparin","IV heparin gtt","Xarelto","Eliquis","Coumadin","SCDs","***"} Code Status: {Blank single:19197::"Full Code","DNR","DNR/DNI","Comfort Care","***"} Family Communication: ***  Consults called: ***  Level of care: {Blank single:19197::"Med-Surg","Telemetry bed","Progressive Care Unit","Step Down Unit"} Admission status: *** Time Spent: 75+ minutes***  John Giovanni MD [facility]  If 7PM-7AM, please contact night-coverage [email or web address]  08/13/2023, 11:53 PM

## 2023-08-13 NOTE — ED Provider Notes (Signed)
 [place] [facility]ERGENCY DEPARTMENT AT [facility] Provider Note   CSN: [id number] Arrival date & time: 08/13/23  1258     History  Chief Complaint  Patient presents with   Chest Pain    Kevin Webb is a 78 y.o. male.   Chest Pain    Pt states he had an episode of having numbness and weakness in his hand yesterday.  He was trying to shave and it didn't feel right. It has all resolved at this point.  It lasted for 40 minutes.  Pt ended up listening to a pod cast about strokes so he decided he should come in to get checked out.  Pt states he has discomfort chronically for 3 years.  He does see his heart doctor for this.  He does have history of CAD  Home Medications Prior to Admission medications   Medication Sig Start Date End Date Taking? Authorizing Provider  amLODipine (NORVASC) 5 MG tablet Take 1 tablet (5 mg total) by mouth daily. 05/12/23   Dana Allan, MD  Ascorbic Acid (VITAMIN C) 1000 MG tablet Take 1,000 mg by mouth daily at 2 am. Reported on 09/26/2015    [provider]  aspirin EC 81 MG tablet Take 81 mg by mouth daily at 2 am. Swallow whole.    [provider]  celecoxib (CELEBREX) 50 MG capsule Take 1 capsule (50 mg total) by mouth 2 (two) times daily. 05/27/23   Michiel Cowboy A, PA-C  cholecalciferol (VITAMIN D) 25 MCG (1000 UNIT) tablet Take 1,000 Units by mouth daily at 2 am.    [provider]  clopidogrel (PLAVIX) 75 MG tablet TAKE 1 TABLET BY MOUTH DAILY 11/06/22   Dana Allan, MD  Coenzyme Q10 (CO Q-10 PO) Take 1 capsule by mouth daily at 2 am.    [provider]  dicyclomine (BENTYL) 10 MG capsule Take 1 capsule (10 mg total) by mouth 3 (three) times daily before meals. 04/06/23   Meryl Dare, MD  dutasteride (AVODART) 0.5 MG capsule Take 1 capsule (0.5 mg total) by mouth daily. 07/13/23   Michiel Cowboy A, PA-C  ezetimibe (ZETIA) 10 MG tablet TAKE 1 TABLET BY MOUTH DAILY 11/06/22   Dana Allan, MD  [facility]  BILOBA PO Take 1 capsule by mouth daily at 2 am.     [provider]  isosorbide mononitrate (IMDUR) 120 MG 24 hr tablet Take 1 tablet (120 mg total) by mouth daily. 10/02/22   Alver Sorrow, NP  MAGNESIUM PO Take 1 tablet by mouth daily at 2 am.    [provider]  metoprolol succinate (TOPROL-XL) 25 MG 24 hr tablet Take 1 tablet (25 mg total) by mouth daily. 05/12/23   Dana Allan, MD  Multiple Vitamin (MULTIVITAMIN WITH MINERALS) TABS tablet Take 1 tablet by mouth daily at 2 am.    [provider]  niacin 250 MG tablet Take 250 mg by mouth daily.    [provider]  nitroGLYCERIN (NITROSTAT) 0.4 MG SL tablet Place 1 tablet (0.4 mg total) under the tongue every 5 (five) minutes x 3 doses as needed. 10/28/21   Rollene Rotunda, MD  Omega-3 Fatty Acids (FISH OIL) 1000 MG CAPS Take 2,000 mg by mouth daily at 2 am.     [provider]  pantoprazole (PROTONIX) 40 MG tablet Take 1 tablet (40 mg total) by mouth daily. 04/06/23   Meryl Dare, MD  ramipril (ALTACE) 10 MG capsule Take 1 capsule (10  mg total) by mouth daily. 09/15/22   Dana Allan, MD  rosuvastatin (CRESTOR) 40 MG tablet TAKE 1 TABLET AT BEDTIME 11/06/22   Dana Allan, MD  tamsulosin [facility]) 0.4 MG CAPS capsule Take 1 capsule (0.4 mg total) by mouth daily at 2 am. 07/13/23   Michiel Cowboy A, PA-C  vitamin E 100 UNIT capsule Take 100 Units by mouth daily at 2 am.     [provider]      Allergies    Darvocet [propoxyphene n-acetaminophen] and Propoxyphene    Review of Systems   Review of Systems  Cardiovascular:  Positive for chest pain.    Physical Exam Updated Vital Signs BP 133/70 (BP Location: Left Arm)   Pulse (!) 54   Temp 97.8 F (36.6 C) (Oral)   Resp 18   Ht 1.753 m (5\' 9" )   Wt 88.5 kg   SpO2 99%   BMI 28.81 kg/m  Physical Exam Vitals and nursing note reviewed.  Constitutional:      General: He is not in acute distress.    Appearance: He is  well-developed.  HENT:     Head: Normocephalic and atraumatic.     Right Ear: External ear normal.     Left Ear: External ear normal.  Eyes:     General: No visual field deficit or scleral icterus.       Right eye: No discharge.        Left eye: No discharge.     Conjunctiva/sclera: Conjunctivae normal.  Neck:     Trachea: No tracheal deviation.  Cardiovascular:     Rate and Rhythm: Normal rate and regular rhythm.  Pulmonary:     Effort: Pulmonary effort is normal. No respiratory distress.     Breath sounds: Normal breath sounds. No stridor. No wheezing or rales.  Abdominal:     General: Bowel sounds are normal. There is no distension.     Palpations: Abdomen is soft.     Tenderness: There is no abdominal tenderness. There is no guarding or rebound.  Musculoskeletal:        General: No tenderness or deformity.     Cervical back: Neck supple.  Skin:    General: Skin is warm and dry.     Findings: No rash.  Neurological:     General: No focal deficit present.     Mental Status: He is alert and oriented to person, place, and time.     Cranial Nerves: No cranial nerve deficit, dysarthria or facial asymmetry.     Sensory: No sensory deficit.     Motor: No abnormal muscle tone, seizure activity or pronator drift.     Coordination: Coordination normal.     Comments:  able to hold both legs off bed for 5 seconds, sensation intact in all extremities,  no left or right sided neglect, normal finger-nose exam bilaterally, no nystagmus noted   Psychiatric:        Mood and Affect: Mood normal.     ED Results / Procedures / Treatments   Labs (all labs ordered are listed, but only abnormal results are displayed) Labs Reviewed  BASIC METABOLIC PANEL WITH GFR - Abnormal; Notable for the following components:      Result Value   Glucose, Bld 169 (*)    Calcium 8.8 (*)    All other components within normal limits  CBC  TROPONIN I (HIGH SENSITIVITY)  TROPONIN I (HIGH SENSITIVITY)     EKG EKG Interpretation Date/Time:  Friday August 13 2023 13:03:06 EDT Ventricular Rate:  56 PR Interval:  132 QRS Duration:  148 QT Interval:  462 QTC Calculation: 445 R Axis:   -68  Text Interpretation: Sinus bradycardia Left axis deviation Right bundle branch block Abnormal ECG When compared with ECG of 30-Dec-2022 08:07, Premature supraventricular complexes are no longer Present Confirmed by Beckey Downing [phone]) on 08/13/2023 1:09:50 PM  Radiology MR BRAIN WO CONTRAST Result Date: 08/13/2023 CLINICAL DATA:  Neuro deficit, weakness notice yesterday, concern for stroke. EXAM: MRI HEAD WITHOUT CONTRAST TECHNIQUE: Multiplanar, multiecho pulse sequences of the brain and surrounding structures were obtained without intravenous contrast. COMPARISON:  CTA head and neck earlier same day. MRI head 01/19/2020. FINDINGS: Brain: There is a 10 mm focus of restricted diffusion in the right centrum semiovale compatible with acute infarct. Additional punctate foci of acute infarct involving the right precentral gyrus in the region of the hand knob, posterior parasagittal right parietal cortex, and lateral right temporal cortex. Curvilinear susceptibility along the left parietal cortex suggestive of remote hemorrhage. No associated edema or mass effect. No findings to suggest acute intracranial hemorrhage. Scattered signal abnormality in the periventricular and subcortical white matter. Remote lacunar infarcts in the bilateral centrum semiovale. Encephalomalacia and gliosis in the left middle frontal gyrus compatible with remote infarct. Additional small remote right parietal cortical infarct. Remote lacunar infarct in the left thalamus. Chronic microhemorrhage in the left cerebellum. Normal appearance of midline structures. The basilar cisterns are patent. No extra-axial fluid collections. Ventricles: Prominence of the lateral ventricles suggestive of underlying parenchymal volume loss. Vascular: Skull base flow  voids are visualized. Skull and upper cervical spine: No focal abnormality. Sinuses/Orbits: Mucosal thickening in the ethmoid and maxillary sinuses. Other: Mastoid air cells are clear. IMPRESSION: 10 mm focus of acute infarct in the right centrum semiovale. Additional punctate foci of acute infarct involving the right precentral gyrus near the hand knob as well as the posterior right parietal cortex and lateral right temporal cortex. Nonspecific white matter signal abnormality likely reflecting mild chronic microvascular ischemic changes. Mild parenchymal volume loss. Multiple remote infarcts as above. Susceptibility over the left parietal cortex is new since 2021 suggestive of remote hemorrhage. Chronic microhemorrhage again noted in the left cerebellum. Electronically Signed   By: Emily Filbert M.D.   On: 08/13/2023 18:52   CT ANGIO HEAD NECK W WO CM Result Date: 08/13/2023 CLINICAL DATA:  Neuro deficit, concern for stroke, episodes of right hand weakness yesterday. EXAM: CT ANGIOGRAPHY HEAD AND NECK WITH AND WITHOUT CONTRAST TECHNIQUE: Multidetector CT imaging of the head and neck was performed using the standard protocol during bolus administration of intravenous contrast. Multiplanar CT image reconstructions and MIPs were obtained to evaluate the vascular anatomy. Carotid stenosis measurements (when applicable) are obtained utilizing NASCET criteria, using the distal internal carotid diameter as the denominator. RADIATION DOSE REDUCTION: This exam was performed according to the departmental dose-optimization program which includes automated exposure control, adjustment of the mA and/or kV according to patient size and/or use of iterative reconstruction technique. CONTRAST:  75mL OMNIPAQUE IOHEXOL 350 MG/ML SOLN COMPARISON:  CTA head and neck 01/19/2020. FINDINGS: CT HEAD FINDINGS Brain: No acute intracranial hemorrhage. No CT evidence of acute infarct. Nonspecific hypoattenuation in the periventricular and  subcortical white matter favored to reflect chronic microvascular ischemic changes. Redemonstrated remote lacunar infarct in the left thalamus. No edema, mass effect, or midline shift. The basilar cisterns are patent. Ventricles: Ventricles are normal in size and configuration. Vascular: No hyperdense vessel. Redemonstrated  atherosclerosis involving the carotid siphons and intracranial vertebral arteries. Additional focal atherosclerosis along the left M1 segment. Skull: No acute or aggressive finding. Sinuses/orbits: Orbits are symmetric. Mucosal thickening and possible mucous retention cyst in the left maxillary sinus. Other: Mastoid air cells are clear. CTA NECK FINDINGS Aortic arch: Standard configuration of the aortic arch. Imaged portion shows no evidence of aneurysm or dissection. Mild atherosclerosis of the aortic arch. Sequelae of CABG. No significant stenosis of the major arch vessel origins. Pulmonary arteries: As permitted by contrast timing, there are no filling defects in the visualized pulmonary arteries. Subclavian arteries: The subclavian arteries are patent bilaterally. Right carotid system: Patent. Mild atherosclerosis along the common carotid artery. Bulky calcified atherosclerosis an additional noncalcified atherosclerotic plaque involving the carotid bifurcation extending into the carotid bulb. There is approximately 65% stenosis at the origin of the right cervical ICA. External carotid artery branches are patent. No evidence of dissection. Left carotid system: Patent. Mild atherosclerosis along the common carotid artery. Bulky calcified atherosclerosis with additional component of noncalcified atherosclerotic plaque at the carotid bifurcation extending into the carotid bulb. Approximately 60% stenosis at the origin of the left cervical ICA. Left EAC branches are patent. Vertebral arteries: Codominant. Patent from the origins to the vertebrobasilar confluence. Atherosclerosis involving the  vertebral artery origins resulting in moderate stenosis on the right and severe stenosis on the left. Additional atherosclerosis of the left V3 segment and bilateral V4 segments. There is moderate focal stenosis of the proximal right V4 segment and mild stenosis of the left V4 segment. No evidence of dissection, stenosis (50% or greater), or occlusion. Skeleton: No acute findings. Degenerative changes in the cervical spine. Sequelae of median sternotomy. Other neck: The visualized airway is patent. No cervical lymphadenopathy. Upper chest: Visualized lung apices are clear. Review of the MIP images confirms the above findings CTA HEAD FINDINGS ANTERIOR CIRCULATION: The intracranial ICAs are patent bilaterally. Atherosclerosis of the bilateral carotid siphons. Mild stenosis of the bilateral cavernous ICAs. Moderate stenosis of the bilateral supraclinoid ICAs slightly greater on the left. No proximal occlusion, aneurysm, or vascular malformation. MCAs: The middle cerebral arteries are patent bilaterally. ACAs: The anterior cerebral arteries are patent bilaterally. POSTERIOR CIRCULATION: No significant stenosis, proximal occlusion, aneurysm, or vascular malformation. PCAs: The posterior cerebral arteries are patent bilaterally. Pcomm: Not well visualized. SCAs: The superior cerebellar arteries are patent bilaterally. Basilar artery: Patent AICAs: Visualized on the left. PICAs: Visualized bilaterally more pronounced on the right. Vertebral arteries: The intracranial vertebral arteries are patent. Venous sinuses: As permitted by contrast timing, patent. Anatomic variants: None Review of the MIP images confirms the above findings IMPRESSION: No large vessel occlusion. No CT evidence of acute intracranial abnormality. Focal moderate stenosis of the V4 segment right vertebral artery. Moderate stenosis of the bilateral supraclinoid ICAs. Atherosclerosis in the neck as above. Approximately 65% stenosis at the origin of the  right cervical ICA and 60% stenosis at the origin of the left cervical ICA. Stenosis at the origins of the vertebral arteries, severe on the left and moderate on the right. Remote lacunar infarct in the left thalamus. Electronically Signed   By: Emily Filbert M.D.   On: 08/13/2023 17:19   DG Chest 2 View Result Date: 08/13/2023 CLINICAL DATA:  Chest pain EXAM: CHEST - 2 VIEW COMPARISON:  Chest radiograph dated 12/30/2022 FINDINGS: Normal lung volumes. No focal consolidations. No pleural effusion or pneumothorax. Similar postsurgical cardiomediastinal silhouette. Median sternotomy wires are nondisplaced. IMPRESSION: No active cardiopulmonary disease. Electronically Signed  By: Agustin Cree M.D.   On: 08/13/2023 14:32    Procedures Procedures    Medications Ordered in ED Medications  iohexol (OMNIPAQUE) 350 MG/ML injection 75 mL (75 mLs Intravenous Contrast Given 08/13/23 1609)    ED Course/ Medical Decision Making/ A&P Clinical Course as of 08/13/23 2342  Fri Aug 13, 2023  1845 Patient's labs are unremarkable.  CBC and metabolic panel are normal.  Troponin is normal.  CT angiogram shows no evidence of large vessel occlusion.  Patient does have evidence of stenosis in the retrievable arteries as well as into her carotid arteries.  Remote infarct noted [JK]  1856 Patient's MRI shows an acute infarct in 3 areas. [JK]  1932 Case discussed with Dr. Francene Castle regarding admission [JK]  1938 D/w Dr Donovan Kail.  Make sure he is taking his asa and plavix [JK]    Clinical Course User Index [JK] Linwood Dibbles, MD                                 Medical Decision Making Problems Addressed: Cerebrovascular accident (CVA), unspecified mechanism [facility]): acute illness or injury that poses a threat to life or bodily functions  Amount and/or Complexity of Data Reviewed Labs: ordered. Decision-making details documented in ED Course. Radiology: ordered and independent interpretation performed.  Risk OTC  drugs. Prescription drug management. Decision regarding hospitalization.   Patient presented to the ED for evaluation of an episode of weakness yesterday.  Patient states his symptoms have mostly resolved at this point.  He is complaints however concerning for the possibility of TIA stroke.  Patient's ED workup did not show any signs of anemia.  No significant metabolic abnormalities.  Troponins were normal.  Initial CT angiogram did show evidence of vascular stenosis but no large vessel occlusion.  Patient's MRI does confirm acute infarcts.  Patient has remained stable in the ED.  He will require admission to the hospital for further treatment and evaluation.  I will arrange for admission to hospital at [facility]        Final Clinical Impression(s) / ED Diagnoses Final diagnoses:  Cerebrovascular accident (CVA), unspecified mechanism [facility])    Rx / DC Orders ED Discharge Orders     None         Linwood Dibbles, MD 08/13/23 2343

## 2023-08-13 NOTE — Telephone Encounter (Signed)
 STAT if patient reports NEW onset of slurred speech, one sided weakness, facial droop.  If patient reports one of the STAT symptoms recommend, they hang up and call 911 immediately.   If patient refuses to call 911 make a STAT call and refer to questions 3 and 4.  What are your symptoms? Blurred vision, right hand felt like it didn't work, very tired/fatigue and was hard to keep his eyes open yesterday morning for about 2 hrs. Today no symptoms just still has a headache.   When did your symptoms start? Yesterday morning.

## 2023-08-13 NOTE — Plan of Care (Addendum)
 Plan of Care Note for accepted transfer  Patient: Kevin Webb              HQI:[id number]  DOA: 08/13/2023     Facility requesting transfer: [facility] emergency department Requesting Provider: Dr. Lynelle Doctor  Reason for transfer: Acute ischemic stroke  ED triage note:Pt via pov from home with chest discomfort and sob today. He reports that he may have had a mini stroke yesterday - he had trouble picking up his razor. He was also off balance and was having fatigue. Pt has hx of stroke (noted on MRI but did not have symptoms). Pt alert & oriented, nad noted.   Facility course: 78 year old man history of CAD status post CABG with associated chronic chest discomfort, touted stenosis, DM type II, essential hypertension, hyperlipidemia, BPH and right bundle branch block presented emergency department complaining of episode of having numbness and weakness in his hands since yesterday 3/27. He was trying to shave and it didn't feel right. It has all resolved at this point.  It lasted for 40 minutes.  Pt ended up listening to a pod cast about strokes so he decided he should come in to get checked out.    At presentation to ED patient is hemodynamically stable however heart rate in between 49-54. Normal troponin level. CBC unremarkable. BMP unremarkable except slightly elevated blood glucose 169. EKG showing sinus bradycardia heart rate 56.  MRI of the brain showing: 10 mm focus of acute infarct in the right centrum semiovale. Additional punctate foci of acute infarct involving the right precentral gyrus near the hand knob as well as the posterior right parietal cortex and lateral right temporal cortex.   Nonspecific white matter signal abnormality likely reflecting mild chronic microvascular ischemic changes.   Mild parenchymal volume loss.   Multiple remote infarcts as above.   Susceptibility over the left parietal cortex is new since 2021 suggestive of remote hemorrhage. Chronic  microhemorrhage again noted in the left cerebellum.    CTA head and neck - No evidence of large vessel occlusion. Focal moderate stenosis of the V4 segment right vertebral artery. Moderate stenosis of the bilateral supraclinoid ICAs.  Atherosclerosis in the neck as above. Approximately 65% stenosis at the origin of the right cervical ICA and 60% stenosis at the origin of the left cervical ICA.  Stenosis at the origins of the vertebral arteries, severe on the left and moderate on the right.  Remote lacunar infarct in the left thalamus.  Hospitalist has been consulted for further evaluation management of acute ischemic stroke. Requested Dr. Lynelle Doctor to consult and reach out to on-call neurology for further recommendation Update, neurology Dr. Welton Flakes aware  of the patient.  Please inform him upon arrival to [facility].  Chest x-ray unremarkable. Plan of care: The patient is accepted for admission for inpatient status to Telemetry unit, at [facility].  [facility] will assume care on arrival to accepting facility. Until arrival, care as per EDP. However, [facility] available 24/7 for questions and assistance.   Check [email or web address] for on-call coverage.   Nursing staff, please call [facility] Admits & Consults System-Wide number under Amion on patient's arrival so appropriate admitting provider can evaluate the pt.    Author: Tereasa Coop, MD  08/13/2023  [name]

## 2023-08-14 ENCOUNTER — Inpatient Hospital Stay (HOSPITAL_COMMUNITY)

## 2023-08-14 DIAGNOSIS — R072 Precordial pain: Secondary | ICD-10-CM

## 2023-08-14 DIAGNOSIS — R29700 NIHSS score 0: Secondary | ICD-10-CM | POA: Diagnosis not present

## 2023-08-14 DIAGNOSIS — I6389 Other cerebral infarction: Secondary | ICD-10-CM | POA: Diagnosis not present

## 2023-08-14 DIAGNOSIS — E1165 Type 2 diabetes mellitus with hyperglycemia: Secondary | ICD-10-CM

## 2023-08-14 DIAGNOSIS — I6521 Occlusion and stenosis of right carotid artery: Secondary | ICD-10-CM

## 2023-08-14 DIAGNOSIS — K219 Gastro-esophageal reflux disease without esophagitis: Secondary | ICD-10-CM

## 2023-08-14 DIAGNOSIS — R001 Bradycardia, unspecified: Secondary | ICD-10-CM

## 2023-08-14 DIAGNOSIS — I25810 Atherosclerosis of coronary artery bypass graft(s) without angina pectoris: Secondary | ICD-10-CM

## 2023-08-14 DIAGNOSIS — I639 Cerebral infarction, unspecified: Secondary | ICD-10-CM | POA: Diagnosis not present

## 2023-08-14 DIAGNOSIS — E785 Hyperlipidemia, unspecified: Secondary | ICD-10-CM

## 2023-08-14 LAB — LIPID PANEL
Cholesterol: 113 mg/dL (ref 0–200)
HDL: 45 mg/dL (ref >40)
LDL Cholesterol: 42 mg/dL (ref 0–99)
Total CHOL/HDL Ratio: 2.5 ratio
Triglycerides: 130 mg/dL (ref <150)
VLDL: 26 mg/dL (ref 0–40)

## 2023-08-14 LAB — ECHOCARDIOGRAM COMPLETE
AR max vel: 2.2 cm2
AV Peak grad: 9.5 mmHg
Ao pk vel: 1.54 m/s
Area-P 1/2: 3.37 cm2
Height: 69 in
MV M vel: 2.66 m/s
MV Peak grad: 28.3 mmHg
P 1/2 time: 564 ms
S' Lateral: 3.6 cm
Weight: 3156.99 [oz_av]

## 2023-08-14 LAB — GLUCOSE, CAPILLARY
Glucose-Capillary: 110 mg/dL — ABNORMAL HIGH (ref 70–99)
Glucose-Capillary: 110 mg/dL — ABNORMAL HIGH (ref 70–99)
Glucose-Capillary: 185 mg/dL — ABNORMAL HIGH (ref 70–99)
Glucose-Capillary: 149 mg/dL — ABNORMAL HIGH (ref 70–99)
Glucose-Capillary: 161 mg/dL — ABNORMAL HIGH (ref 70–99)

## 2023-08-14 LAB — HEMOGLOBIN A1C
Hgb A1c MFr Bld: 6 % — ABNORMAL HIGH (ref 4.8–5.6)
Mean Plasma Glucose: 125.5 mg/dL

## 2023-08-14 LAB — TSH: TSH: 2.88 u[IU]/mL (ref 0.350–4.500)

## 2023-08-14 MED ORDER — STROKE: EARLY STAGES OF RECOVERY BOOK
Freq: Once | Status: AC
  Filled 2023-08-14: qty 1

## 2023-08-14 MED ORDER — INSULIN ASPART 100 UNIT/ML IJ SOLN
0.0000 [IU] | Freq: Three times a day (TID) | INTRAMUSCULAR | Status: DC
  Administered 2023-08-14: 2 [IU] via SUBCUTANEOUS
  Administered 2023-08-14: 1 [IU] via SUBCUTANEOUS
  Administered 2023-08-15: 2 [IU] via SUBCUTANEOUS
  Administered 2023-08-15 – 2023-08-16 (×2): 1 [IU] via SUBCUTANEOUS
  Administered 2023-08-16 – 2023-08-17 (×2): 2 [IU] via SUBCUTANEOUS
  Administered 2023-08-18: 1 [IU] via SUBCUTANEOUS

## 2023-08-14 MED ORDER — EZETIMIBE 10 MG PO TABS
10.0000 mg | ORAL_TABLET | Freq: Every day | ORAL | Status: DC
  Administered 2023-08-14 – 2023-08-18 (×5): 10 mg via ORAL
  Filled 2023-08-14 (×5): qty 1

## 2023-08-14 MED ORDER — FOLIC ACID 1 MG PO TABS
1.0000 mg | ORAL_TABLET | Freq: Every day | ORAL | Status: DC
  Administered 2023-08-14 – 2023-08-16 (×3): 1 mg via ORAL
  Filled 2023-08-14 (×3): qty 1

## 2023-08-14 MED ORDER — PERFLUTREN LIPID MICROSPHERE
1.0000 mL | INTRAVENOUS | Status: AC | PRN
  Administered 2023-08-14: 4 mL via INTRAVENOUS

## 2023-08-14 MED ORDER — LORAZEPAM 2 MG/ML IJ SOLN: 1.0000 mg | INTRAMUSCULAR | Status: DC | PRN

## 2023-08-14 MED ORDER — DUTASTERIDE 0.5 MG PO CAPS
0.5000 mg | ORAL_CAPSULE | Freq: Every day | ORAL | Status: DC
  Administered 2023-08-14 – 2023-08-18 (×5): 0.5 mg via ORAL
  Filled 2023-08-14 (×6): qty 1

## 2023-08-14 MED ORDER — TAMSULOSIN HCL 0.4 MG PO CAPS
0.4000 mg | ORAL_CAPSULE | Freq: Every day | ORAL | Status: DC
  Administered 2023-08-14 – 2023-08-18 (×6): 0.4 mg via ORAL
  Filled 2023-08-14 (×6): qty 1

## 2023-08-14 MED ORDER — THIAMINE HCL 100 MG/ML IJ SOLN
100.0000 mg | Freq: Every day | INTRAMUSCULAR | Status: DC
  Filled 2023-08-14: qty 2

## 2023-08-14 MED ORDER — ADULT MULTIVITAMIN W/MINERALS CH
1.0000 | ORAL_TABLET | Freq: Every day | ORAL | Status: DC
  Administered 2023-08-14 – 2023-08-18 (×5): 1 via ORAL
  Filled 2023-08-14 (×5): qty 1

## 2023-08-14 MED ORDER — PANTOPRAZOLE SODIUM 40 MG PO TBEC
40.0000 mg | DELAYED_RELEASE_TABLET | Freq: Every day | ORAL | Status: DC
  Administered 2023-08-14 – 2023-08-18 (×5): 40 mg via ORAL
  Filled 2023-08-14 (×5): qty 1

## 2023-08-14 MED ORDER — LORAZEPAM 1 MG PO TABS: 1.0000 mg | ORAL_TABLET | ORAL | Status: DC | PRN

## 2023-08-14 MED ORDER — ROSUVASTATIN CALCIUM 20 MG PO TABS
40.0000 mg | ORAL_TABLET | Freq: Every day | ORAL | Status: DC
Start: 2023-08-14 — End: 2023-08-18
  Administered 2023-08-14 – 2023-08-17 (×4): 40 mg via ORAL
  Filled 2023-08-14 (×4): qty 2

## 2023-08-14 MED ORDER — PERFLUTREN LIPID MICROSPHERE: 1.0000 mL | INTRAVENOUS | Status: AC | PRN

## 2023-08-14 MED ORDER — INSULIN ASPART 100 UNIT/ML IJ SOLN
0.0000 [IU] | Freq: Every day | INTRAMUSCULAR | Status: DC
  Administered 2023-08-17: 2 [IU] via SUBCUTANEOUS

## 2023-08-14 MED ORDER — THIAMINE MONONITRATE 100 MG PO TABS
100.0000 mg | ORAL_TABLET | Freq: Every day | ORAL | Status: DC
  Administered 2023-08-14 – 2023-08-18 (×5): 100 mg via ORAL
  Filled 2023-08-14 (×5): qty 1

## 2023-08-14 NOTE — Evaluation (Signed)
 Physical Therapy Brief Evaluation and Discharge Note Patient Details Name: Kevin Webb MRN: [id number] DOB: 1945-11-24 Today's Date: 08/14/2023   History of Present Illness  Pt is a 78 y.o. male presenting 3/28 after episode of difficulty picking up his razor and poor balance day prior lasting ~40 mins. MRI brain w/o contrast with infarct of right precentral gyrus, right posterior parietal cortex and right lateral temporal cortex.CTA head/neck with 65% stenosis in the R ICA and 60% stenosis in L ICA. PMH significant for CAD s/p CABG, HTN, HLD, DMII, TIA, BPH, GERD alcohol use disorder.  Clinical Impression  PTA pt living with wife, owns plumbing company gets up and down from the ground multiple times daily. Pt has no significant deficits to strength, sensation or coordination. Pt scores 24/24 on Dynamic Gait Index which is indicative of very low fall risk. Pt and wife educated on BE FAST Symptomology. Pt has no further PT needs. PT signing off.        PT Assessment Patient does not need any further PT services  Assistance Needed at Discharge  PRN    Equipment Recommendations None recommended by PT  Recommendations for Other Services       Precautions/Restrictions Precautions Precautions: None Restrictions Weight Bearing Restrictions Per Provider Order: No        Mobility  Bed Mobility       General bed mobility comments: sitting EoB on entry  Transfers Overall transfer level: Independent                 General transfer comment: good power up and self steady    Ambulation/Gait Ambulation/Gait assistance: Independent Gait Distance (Feet): 400 Feet Assistive device: None Gait Pattern/deviations: WFL(Within Functional Limits), Step-through pattern Gait Speed: Pace WFL General Gait Details: strong, steady gait and community level speed    Stairs Stairs: Yes Stairs assistance: Independent Stair Management: No rails, Alternating pattern, Forwards Number of  Stairs: 5 General stair comments: strong, steady ascent and descent of steps  Modified Rankin (Stroke Patients Only) Modified Rankin (Stroke Patients Only) Pre-Morbid Rankin Score: No symptoms Modified Rankin: No symptoms      Balance Overall balance assessment: No apparent balance deficits (not formally assessed)               Standardized Balance Assessment Standardized Balance Assessment : Dynamic Gait Index Dynamic Gait Index Level Surface: Normal Change in Gait Speed: Normal Gait with Horizontal Head Turns: Normal Gait with Vertical Head Turns: Normal Gait and Pivot Turn: Normal Step Over Obstacle: Normal Step Around Obstacles: Normal Steps: Normal Total Score: 24      Pertinent Vitals/Pain PT - Brief Vital Signs All Vital Signs Stable: Yes Pain Assessment Pain Assessment: No/denies pain     Home Living Family/patient expects to be discharged to:: Private residence Living Arrangements: Spouse/significant other Available Help at Discharge: Available PRN/intermittently     Home Equipment: None        Prior Function Level of Independence: Independent Comments: works as a IT consultant   UE ROM/Strength/Tone/Coordination: [employer]    LE ROM/Strength/Tone/Coordination: [employer]      Communication   Communication Communication: No apparent difficulties     Cognition Overall Cognitive Status: Appears within functional limits for tasks assessed/performed       General Comments General comments (skin integrity, edema, etc.): wife present, educated both on BE FAST symptomology of stroke        Assessment/Plan     No Skilled PT All education  completed;Patient at baseline level of functioning;Patient will have necessary level of assist by caregiver at discharge;Patient is independent with all acitivity/mobility    AMPAC 6 Clicks Help needed turning from your back to your side while in a flat bed without using bedrails?: None Help  needed moving from lying on your back to sitting on the side of a flat bed without using bedrails?: None Help needed moving to and from a bed to a chair (including a wheelchair)?: None Help needed standing up from a chair using your arms (e.g., wheelchair or bedside chair)?: None Help needed to walk in hospital room?: None Help needed climbing 3-5 steps with a railing? : None 6 Click Score: 24      End of Session Equipment Utilized During Treatment: Gait belt Activity Tolerance: Patient tolerated treatment well Patient left: with family/visitor present;with call bell/phone within reach;Other (comment) (sitting EoB) Nurse Communication: Mobility status       Time: 1322-1340 PT Time Calculation (min) (ACUTE ONLY): 18 min  Charges:   PT Evaluation $PT Eval Low Complexity: 1 Low      Emran Molzahn B. Beverely Risen PT, DPT Acute Rehabilitation Services Please use secure chat or  Call Office [phone]   Elon Alas Fleet  08/14/2023, 2:18 PM

## 2023-08-14 NOTE — Consult Note (Signed)
 Hospital Consult    Reason for Consult: Symptomatic carotid stenosis Requesting Service/PhysicianAlanda Slim  MRN #:  [id number]  History of Present Illness: This is a 78 y.o. male HTN, HLD and CAD status post CABG over 25 years ago.  Reports that he noted feeling unwell at work 3/27 and went to lay down for some time.  At that time he reports he had symptoms of blurry vision and issues with coordination.  He was concerned about his episode and came to the ED overnight last night.  He does have a history of a remote stroke that was found on imaging but denies ever having any symptoms of one-sided weakness, numbness, amaurosis or speech issues.  He is compliant with dual antiplatelet, aspirin and Plavix.  CTA demonstrated a 65% stenosis of the right ICA and 60% stenosis of the left ICA.  MRI brain demonstrated acute infarcts in the right hemisphere.  He denies any previous neck surgeries or radiation.  He does report some shortness of breath with exertion such as doing a few flights of stairs.  Past Medical History:  Diagnosis Date   Acute cholecystitis 04/18/2013   Adenomatous colon polyp 12/2004   Adenomatous colon polyp 12/16/2004   Anemia    Anemia    Blood transfusion without reported diagnosis    BPH associated with nocturia    CAD (coronary artery disease)    a. s/p CABG x4 in 2000 with LIMA-LAD, reverse SVG-IM, reverse SVG-acute margin and reverse SVG-RCA b. s/p BMS to SVG-RI in 2012 c. 12/2016: PCI/DES to SVG-RCA and PCI/DES to SVG-RI (Dr. Antoine Poche)    Carotid artery stenosis    mild    Cataract    Cervical radiculopathy 03/26/2020   Clotting disorder (HCC)    COLONIC POLYPS, HX OF 09/30/2006   Qualifier: Diagnosis of   By: Alwyn Ren MD, William          Polypectomy 2004, 2009; Dr Russella Dar         Coronary atherosclerosis of artery bypass graft 09/30/2006   Qualifier: Diagnosis of   By: Alwyn Ren MD, William       Diabetes mellitus without complication (HCC) 09/28/2016   recent A1C 6.1  12/25/16 controlled with diet and exercise    Diverticulitis 2008/2009   Diverticulitis    Dizziness 04/21/2020   Educated about COVID-19 virus infection 10/06/2018   GERD 02/22/2007   Qualifier: Diagnosis of   By: Alwyn Ren MD, Chrissie Noa       GERD (gastroesophageal reflux disease)    controlled as of 04/01/17    HSV infection    HTN (hypertension)    HTN (hypertension) 09/30/2006   Qualifier: Diagnosis of   By: Alwyn Ren MD, William       Hx of CABG 12/25/2020   Hx of dysplastic nevus 05/04/2018   upper back spinal    Hyperglycemia    Hyperlipidemia    Hyperlipidemia LDL goal <70 01/08/2017   Impingement syndrome of left shoulder 2016   Impingement syndrome of left shoulder 05/18/2014   Kidney cysts 06/20/2019   Low back pain 02/15/2019   Low testosterone    Dr Evelene Croon, [facility] ,[place]   Myocardial infarction [facility])    NSTEMI (non-ST elevated myocardial infarction) (HCC) 12/25/2016   Osteoarthritis of right knee    With trace effusion   Peyronie disease    Piriformis syndrome of right side 09/22/2019   Prediabetes 03/29/2018   Prostatitis 12/23/2017   Right arm weakness 02/02/2020   RLS (restless legs syndrome) 08/15/2013  RLS (restless legs syndrome) 08/15/2013   Rotator cuff injury    right; at 74-64 years old.    Sinus bradycardia    Sinus bradycardia    Thrombocytopenia (HCC)    TIA (transient ischemic attack) 01/19/2020   Tubular adenoma of colon 06/26/2020   Type 2 diabetes mellitus without complication, without long-term current use of insulin (HCC) 09/28/2016   Unstable angina (HCC) 12/01/2019   Vitamin D deficiency     Past Surgical History:  Procedure Laterality Date   CHOLECYSTECTOMY N/A 04/21/2013   Procedure: LAPAROSCOPIC CHOLECYSTECTOMY WITH INTRAOPERATIVE CHOLANGIOGRAM;  Surgeon: Currie Paris, MD;  Location: [facility] OR;  Service: General;  Laterality: N/A;   CHOLECYSTECTOMY     COLONOSCOPY W/ POLYPECTOMY  2004 & 2009    X 2; Dr Russella Dar; due 2014   CORONARY  ANGIOPLASTY     CORONARY ARTERY BYPASS GRAFT  04/1999   4 vessels   CORONARY STENT INTERVENTION N/A 12/28/2016   Procedure: CORONARY STENT INTERVENTION;  Surgeon: Runell Gess, MD;  Location: [facility] INVASIVE CV LAB;  Service: Cardiovascular;  Laterality: N/A;   CORONARY STENT PLACEMENT  2012   Plavix   LAPAROSCOPIC CHOLECYSTECTOMY  04/21/2013   Dr Jamey Ripa; acutecholecystitis with necrosis   LEFT HEART CATH AND CORS/GRAFTS ANGIOGRAPHY N/A 12/28/2016   Procedure: LEFT HEART CATH AND CORS/GRAFTS ANGIOGRAPHY;  Surgeon: Runell Gess, MD;  Location: [facility] INVASIVE CV LAB;  Service: Cardiovascular;  Laterality: N/A;   LEFT HEART CATH AND CORS/GRAFTS ANGIOGRAPHY N/A 12/01/2019   Procedure: LEFT HEART CATH AND CORS/GRAFTS ANGIOGRAPHY;  Surgeon: [place], Peter M, MD;  Location: [facility] INVASIVE CV LAB;  Service: Cardiovascular;  Laterality: N/A;   VASECTOMY      Allergies  Allergen Reactions   Darvocet [Propoxyphene N-Acetaminophen] Hives   Propoxyphene Hives    Prior to Admission medications   Medication Sig Start Date End Date Taking? Authorizing Provider  amLODipine (NORVASC) 5 MG tablet Take 1 tablet (5 mg total) by mouth daily. 05/12/23  Yes Dana Allan, MD  Ascorbic Acid (VITAMIN C) 1000 MG tablet Take 1,000 mg by mouth daily at 2 am. Reported on 09/26/2015   Yes [provider]  aspirin EC 81 MG tablet Take 81 mg by mouth daily at 2 am. Swallow whole.   Yes [provider]  celecoxib (CELEBREX) 50 MG capsule Take 1 capsule (50 mg total) by mouth 2 (two) times daily. Patient taking differently: Take 50 mg by mouth 2 (two) times daily as needed for pain. 05/27/23  Yes McGowan, Carollee Herter A, PA-C  cholecalciferol (VITAMIN D) 25 MCG (1000 UNIT) tablet Take 1,000 Units by mouth daily at 2 am.   Yes [provider]  clopidogrel (PLAVIX) 75 MG tablet TAKE 1 TABLET BY MOUTH DAILY 11/06/22  Yes Dana Allan, MD  Coenzyme Q10 (CO Q-10 PO) Take 1 capsule by mouth daily at 2 am.   Yes  [provider]  dicyclomine (BENTYL) 10 MG capsule Take 1 capsule (10 mg total) by mouth 3 (three) times daily before meals. 04/06/23  Yes Meryl Dare, MD  dutasteride (AVODART) 0.5 MG capsule Take 1 capsule (0.5 mg total) by mouth daily. 07/13/23  Yes McGowan, Carollee Herter A, PA-C  ezetimibe (ZETIA) 10 MG tablet TAKE 1 TABLET BY MOUTH DAILY 11/06/22  Yes Dana Allan, MD  GINKGO BILOBA PO Take 1 capsule by mouth daily at 2 am.    Yes [provider]  isosorbide mononitrate (IMDUR) 120 MG 24 hr tablet Take 1 tablet (120  mg total) by mouth daily. 10/02/22  Yes Alver Sorrow, NP  MAGNESIUM PO Take 1 tablet by mouth daily at 2 am.   Yes [provider]  metoprolol succinate (TOPROL-XL) 25 MG 24 hr tablet Take 1 tablet (25 mg total) by mouth daily. 05/12/23  Yes Dana Allan, MD  Multiple Vitamin (MULTIVITAMIN WITH MINERALS) TABS tablet Take 1 tablet by mouth daily at 2 am.   Yes [provider]  niacin 250 MG tablet Take 250 mg by mouth daily.   Yes [provider]  nitroGLYCERIN (NITROSTAT) 0.4 MG SL tablet Place 1 tablet (0.4 mg total) under the tongue every 5 (five) minutes x 3 doses as needed. 10/28/21  Yes Rollene Rotunda, MD  Omega-3 Fatty Acids (FISH OIL) 1000 MG CAPS Take 2,000 mg by mouth daily at 2 am.    Yes [provider]  pantoprazole (PROTONIX) 40 MG tablet Take 1 tablet (40 mg total) by mouth daily. 04/06/23  Yes Meryl Dare, MD  ramipril (ALTACE) 10 MG capsule Take 1 capsule (10 mg total) by mouth daily. 09/15/22  Yes Dana Allan, MD  rosuvastatin (CRESTOR) 40 MG tablet TAKE 1 TABLET AT BEDTIME 11/06/22  Yes Dana Allan, MD  tamsulosin [facility]) 0.4 MG CAPS capsule Take 1 capsule (0.4 mg total) by mouth daily at 2 am. 07/13/23  Yes McGowan, Carollee Herter A, PA-C  vitamin E 100 UNIT capsule Take 100 Units by mouth daily at 2 am.    Yes [provider]    Social History   Socioeconomic History   Marital status: Married     Spouse name: Not on file   Number of children: 1   Years of education: Not on file   Highest education level: Not on file  Occupational History   Occupation:      Comment: plumber  Tobacco Use   Smoking status: Never   Smokeless tobacco: Never  Vaping Use   Vaping status: Never Used  Substance and Sexual Activity   Alcohol use: Yes    Alcohol/week: 10.0 - 12.0 standard drinks of alcohol    Types: 10 - 12 Cans of beer per week    Comment: 2-3 beers + 2-3 whiskey per day (most days)   Drug use: No   Sexual activity: Yes    Birth control/protection: None  Other Topics Concern   Not on file  Social History Narrative   Remarried 2014   1 son age 75 as of 03/2017       Lives at home with spouse   Right handed   Caffeine: 1-2 cups/day (coffee, green tea)   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[place]    Social Drivers of Corporate investment banker Strain: Low Risk  (12/16/2022)   Overall Financial Resource Strain (CARDIA)    Difficulty of Paying Living Expenses: Not hard at all  Food Insecurity: No Food Insecurity (12/16/2022)   Hunger Vital Sign    Worried About Running Out of Food in the Last Year: Never true    Ran Out of Food in the Last Year: Never true  Transportation Needs: No Transportation Needs (12/16/2022)   PRAPARE - Administrator, Civil Service (Medical): No    Lack of Transportation (Non-Medical): No  Physical Activity: Inactive (12/16/2022)   Exercise Vital Sign    Days of Exercise per Week: 0 days    Minutes of Exercise per Session: 0 min  Stress: No Stress Concern Present (12/16/2022)   [employer] of  Occupational Health - Occupational Stress Questionnaire    Feeling of Stress : Not at all  Social Connections: Socially Integrated (12/16/2022)   Social Connection and Isolation Panel [NHANES]    Frequency of Communication with Friends and Family: More than three times a week    Frequency of Social Gatherings with Friends and Family: More than three times  a week    Attends Religious Services: More than 4 times per year    Active Member of [employer] or Organizations: Yes    Attends Engineer, structural: More than 4 times per year    Marital Status: Married  Catering manager Violence: Not At Risk (12/16/2022)   Humiliation, Afraid, Rape, and Kick questionnaire    Fear of Current or Ex-Partner: No    Emotionally Abused: No    Physically Abused: No    Sexually Abused: No    Family History  Problem Relation Age of Onset   Coronary artery disease Mother        CABG in 51s   CVA Mother        cns bleed from warfarin   Aneurysm Mother    Aortic aneurysm Father 71       ? abdominal   Heart attack Father 107   Cholecystitis Sister    Diabetes Brother    Diabetes Other        PGaunt   Cancer Neg Hx    Colon cancer Neg Hx    Prostate cancer Neg Hx    Esophageal cancer Neg Hx    Stomach cancer Neg Hx    Rectal cancer Neg Hx     ROS: Otherwise negative unless mentioned in HPI  Physical Examination  Vitals:   08/14/23 1154 08/14/23 1640  BP: (!) 149/76 (!) 148/87  Pulse: (!) 58 (!) 59  Resp:    Temp: 98.3 F (36.8 C) 98.7 F (37.1 C)  SpO2: 95% 97%   Body mass index is 29.14 kg/m.  General: no acute distress Cardiac: hemodynamically stable Pulm: normal work of breathing Abdomen: non-tender, no pulsatile mass  Neuro: alert, no focal deficit Extremities: 5 out of 5 strength of upper and lower extremities bilaterally. Vascular:   Right: Palpable radial  Left: Palpable radial   Data:   CTA independently reviewed Approximate 60 to 70% stenosis with with atherosclerotic and calcific disease of bilateral carotid bulbs and proximal ICA.  ASSESSMENT/PLAN: This is a 78 y.o. male with symptomatic right ICA stenosis with strokes.  He does not have any residual deficits.  I explained to the threshold for treatment for asymptomatic and symptomatic patients with carotid artery stenosis and  I explained that he is considered to  be symptomatic even though he currently does not have any deficits given that he has imaging with acute stroke.  I explained the 3 modalities of treatment with best medical therapy, carotid endarterectomy and carotid stenting. We discussed the risks and benefits of carotid intrevention.  I informed that there is a small risk of stroke during the procedure which is about 1 to 2% but with medical therapy alone there is a 26% risk of stroke over the next 2 years.  I explained that with intervention, that risk is brought down to about 10%.  We also discussed the risk of cranial nerve injury, bleeding, infection and the small risk of restenosis in the future. I offered TCAR given his age and significant coronary history.  He and his wife expressed understanding and elected to proceed. Will plan  for TCAR on Tuesday, 08/17/2023. He was wondering if he would not stay inpatient through this preoperative period.  I explained that from a surgical perspective he does not although his disposition depends on his admitting provider.  We will plan to get him posted for surgery on Tuesday regardless of his inpatient versus outpatient status.   [name] MD MS [facility] [phone] 08/14/2023  6:35 PM

## 2023-08-14 NOTE — Consult Note (Signed)
 NEUROLOGY CONSULT NOTE   Date of service: August 14, 2023 Patient Name: Kevin Webb MRN:  [id number] DOB:  [birth year] Chief Complaint: "R hand weakness" Requesting Provider: John Giovanni, MD  History of Present Illness  NEVIN GRIZZLE is a 78 y.o. male with hx of CAD, hypertension, hyperlipidemia, DM2, prior TIAs, GERD, prior history of alcohol use who presents with difficulty using his right hand.  He reports that he went to bed on Wednesday late at night and woke up on Thursday morning on 08/12/2023 and noted that he had difficulty while shaving.  His right hand felt fairly clumsy.  He also reports that he has some blurred vision and his coordination was off.  He noted that went to work, he was feeling weak all over.  He took a nap and his symptoms improve.  He reports that he has been having some intermittent trouble.  He came to the ED for further evaluation of this.  Endorses he drinks 3 to 4 cans of 12 ounce of beers.  He also drinks of whiskey.  He had further evaluation with the MRI of the brain without contrast which demonstrated right precentral gyrus stroke along with right posterior parietal cortex and right lateral temporal cortex infarct.  He had CT angio of the head and neck which demonstrated bulky plaque in the carotids bilaterally with about 65% stenosis in the right ICA and about 60% stenosis in left ICA.  LKW: 2300 on 08/11/2023 Modified rankin score: 0-Completely asymptomatic and back to baseline post- stroke IV Thrombolysis: Not offered, outside window. EVT: Not offered, no LVO.    NIHSS components Score: Comment  1a Level of Conscious 0[x]  1[]  2[]  3[]      1b LOC Questions 0[x]  1[]  2[]       1c LOC Commands 0[x]  1[]  2[]       2 Best Gaze 0[x]  1[]  2[]       3 Visual 0[x]  1[]  2[]  3[]      4 Facial Palsy 0[x]  1[]  2[]  3[]      5a Motor Arm - left 0[x]  1[]  2[]  3[]  4[]  UN[]    5b Motor Arm - Right 0[x]  1[]  2[]  3[]  4[]  UN[]    6a Motor Leg - Left 0[x]  1[]  2[]  3[]  4[]  UN[]     6b Motor Leg - Right 0[x]  1[]  2[]  3[]  4[]  UN[]    7 Limb Ataxia 0[x]  1[]  2[]  3[]  UN[]     8 Sensory 0[x]  1[]  2[]  UN[]      9 Best Language 0[x]  1[]  2[]  3[]      10 Dysarthria 0[x]  1[]  2[]  UN[]      11 Extinct. and Inattention 0[x]  1[]  2[]       TOTAL: 0      ROS  Comprehensive ROS performed and pertinent positives documented in HPI   Past History   Past Medical History:  Diagnosis Date   Acute cholecystitis 04/18/2013   Adenomatous colon polyp 12/2004   Adenomatous colon polyp 12/16/2004   Anemia    Anemia    Blood transfusion without reported diagnosis    BPH associated with nocturia    CAD (coronary artery disease)    a. s/p CABG x4 in 2000 with LIMA-LAD, reverse SVG-IM, reverse SVG-acute margin and reverse SVG-RCA b. s/p BMS to SVG-RI in 2012 c. 12/2016: PCI/DES to SVG-RCA and PCI/DES to SVG-RI (Dr. Antoine Poche)    Carotid artery stenosis    mild    Cataract    Cervical radiculopathy 03/26/2020   Clotting disorder (HCC)    COLONIC POLYPS, HX OF  09/30/2006   Qualifier: Diagnosis of   By: Alwyn Ren MD, William          Polypectomy 2004, 2009; Dr Russella Dar         Coronary atherosclerosis of artery bypass graft 09/30/2006   Qualifier: Diagnosis of   By: Alwyn Ren MD, Chrissie Noa       Diabetes mellitus without complication (HCC) 09/28/2016   recent A1C 6.1 12/25/16 controlled with diet and exercise    Diverticulitis 2008/2009   Diverticulitis    Dizziness 04/21/2020   Educated about COVID-19 virus infection 10/06/2018   GERD 02/22/2007   Qualifier: Diagnosis of   By: Alwyn Ren MD, Chrissie Noa       GERD (gastroesophageal reflux disease)    controlled as of 04/01/17    HSV infection    HTN (hypertension)    HTN (hypertension) 09/30/2006   Qualifier: Diagnosis of   By: Alwyn Ren MD, William       Hx of CABG 12/25/2020   Hx of dysplastic nevus 05/04/2018   upper back spinal    Hyperglycemia    Hyperlipidemia    Hyperlipidemia LDL goal <70 01/08/2017   Impingement syndrome of left shoulder 2016    Impingement syndrome of left shoulder 05/18/2014   Kidney cysts 06/20/2019   Low back pain 02/15/2019   Low testosterone    Dr Evelene Croon, [facility]   Myocardial infarction [facility])    NSTEMI (non-ST elevated myocardial infarction) (HCC) 12/25/2016   Osteoarthritis of right knee    With trace effusion   Peyronie disease    Piriformis syndrome of right side 09/22/2019   Prediabetes 03/29/2018   Prostatitis 12/23/2017   Right arm weakness 02/02/2020   RLS (restless legs syndrome) 08/15/2013   RLS (restless legs syndrome) 08/15/2013   Rotator cuff injury    right; at 22-49 years old.    Sinus bradycardia    Sinus bradycardia    Thrombocytopenia (HCC)    TIA (transient ischemic attack) 01/19/2020   Tubular adenoma of colon 06/26/2020   Type 2 diabetes mellitus without complication, without long-term current use of insulin (HCC) 09/28/2016   Unstable angina (HCC) 12/01/2019   Vitamin D deficiency     Past Surgical History:  Procedure Laterality Date   CHOLECYSTECTOMY N/A 04/21/2013   Procedure: LAPAROSCOPIC CHOLECYSTECTOMY WITH INTRAOPERATIVE CHOLANGIOGRAM;  Surgeon: Currie Paris, MD;  Location: [facility] OR;  Service: General;  Laterality: N/A;   CHOLECYSTECTOMY     COLONOSCOPY W/ POLYPECTOMY  2004 & 2009    X 2; Dr Russella Dar; due 2014   CORONARY ANGIOPLASTY     CORONARY ARTERY BYPASS GRAFT  04/1999   4 vessels   CORONARY STENT INTERVENTION N/A 12/28/2016   Procedure: CORONARY STENT INTERVENTION;  Surgeon: Runell Gess, MD;  Location: [facility] INVASIVE CV LAB;  Service: Cardiovascular;  Laterality: N/A;   CORONARY STENT PLACEMENT  2012   Plavix   LAPAROSCOPIC CHOLECYSTECTOMY  04/21/2013   Dr Jamey Ripa; acutecholecystitis with necrosis   LEFT HEART CATH AND CORS/GRAFTS ANGIOGRAPHY N/A 12/28/2016   Procedure: LEFT HEART CATH AND CORS/GRAFTS ANGIOGRAPHY;  Surgeon: Runell Gess, MD;  Location: [facility] INVASIVE CV LAB;  Service: Cardiovascular;  Laterality: N/A;   LEFT HEART CATH AND CORS/GRAFTS  ANGIOGRAPHY N/A 12/01/2019   Procedure: LEFT HEART CATH AND CORS/GRAFTS ANGIOGRAPHY;  Surgeon: [place], Peter M, MD;  Location: [facility] INVASIVE CV LAB;  Service: Cardiovascular;  Laterality: N/A;   VASECTOMY      Family History: Family History  Problem Relation Age of Onset  Coronary artery disease Mother        CABG in 17s   CVA Mother        cns bleed from warfarin   Aneurysm Mother    Aortic aneurysm Father 80       ? abdominal   Heart attack Father 45   Cholecystitis Sister    Diabetes Brother    Diabetes Other        PGaunt   Cancer Neg Hx    Colon cancer Neg Hx    Prostate cancer Neg Hx    Esophageal cancer Neg Hx    Stomach cancer Neg Hx    Rectal cancer Neg Hx     Social History  reports that he has never smoked. He has never used smokeless tobacco. He reports current alcohol use of about 10.0 - 12.0 standard drinks of alcohol per week. He reports that he does not use drugs.  Allergies  Allergen Reactions   Darvocet [Propoxyphene N-Acetaminophen] Hives   Propoxyphene Hives    Medications   Current Facility-Administered Medications:    [START ON 08/15/2023]  stroke: early stages of recovery book, , Does not apply, Once, John Giovanni, MD   aspirin EC tablet 81 mg, 81 mg, Oral, Q0200, John Giovanni, MD, 81 mg at 08/13/23 1955   clopidogrel (PLAVIX) tablet 75 mg, 75 mg, Oral, Daily, John Giovanni, MD   folic acid (FOLVITE) tablet 1 mg, 1 mg, Oral, Daily, Rathore, Ulyess Blossom, MD   insulin aspart (novoLOG) injection 0-5 Units, 0-5 Units, Subcutaneous, QHS, Rathore, Vasundhra, MD   insulin aspart (novoLOG) injection 0-9 Units, 0-9 Units, Subcutaneous, TID WC, Rathore, Vasundhra, MD   LORazepam (ATIVAN) tablet 1-4 mg, 1-4 mg, Oral, Q1H PRN **OR** LORazepam (ATIVAN) injection 1-4 mg, 1-4 mg, Intravenous, Q1H PRN, John Giovanni, MD   multivitamin with minerals tablet 1 tablet, 1 tablet, Oral, Daily, John Giovanni, MD   thiamine (VITAMIN B1) tablet 100  mg, 100 mg, Oral, Daily **OR** thiamine (VITAMIN B1) injection 100 mg, 100 mg, Intravenous, Daily, John Giovanni, MD  Vitals   Vitals:   08/13/23 2100 08/13/23 2102 08/13/23 2354 08/14/23 0322  BP:  134/77 105/71 136/84  Pulse:  (!) 59 (!) 46 (!) 48  Resp:  18 18 18   Temp:  97.7 F (36.5 C) 98 F (36.7 C) 98.4 F (36.9 C)  TempSrc:  Oral Oral Oral  SpO2:  98% 98% 99%  Weight: 89.5 kg     Height: 5\' 9"  (1.753 m)       Body mass index is 29.14 kg/m.  Physical Exam   General: Laying comfortably in bed; in no acute distress.  HENT: Normal oropharynx and mucosa. Normal external appearance of ears and nose.  Neck: Supple, no pain or tenderness  CV: No JVD. No peripheral edema.  Pulmonary: Symmetric Chest rise. Normal respiratory effort.  Abdomen: Soft to touch, non-tender.  Ext: No cyanosis, edema, or deformity  Skin: No rash. Normal palpation of skin.   Musculoskeletal: Normal digits and nails by inspection. No clubbing.   Neurologic Examination  Mental status/Cognition: Alert, oriented to self, place, month and year, good attention.  Speech/language: Fluent, comprehension intact, object naming intact, repetition intact.  Cranial nerves:   CN II Pupils equal and reactive to light, no VF deficits    CN III,IV,VI EOM intact, no gaze preference or deviation, no nystagmus    CN V normal sensation in V1, V2, and V3 segments bilaterally    CN VII no asymmetry, no nasolabial  fold flattening    CN VIII normal hearing to speech    CN IX & X normal palatal elevation, no uvular deviation    CN XI 5/5 head turn and 5/5 shoulder shrug bilaterally    CN XII midline tongue protrusion    Motor:  Muscle bulk: Normal, tone normal, pronator drift none tremor none Mvmt Root Nerve  Muscle Right Left Comments  SA C5/6 Ax Deltoid 5 5   EF C5/6 Mc Biceps 5 5   EE C6/7/8 Rad Triceps 5 5   WF C6/7 Med FCR     WE C7/8 PIN ECU     F Ab C8/T1 U ADM/FDI 5 5   HF L1/2/3 Fem Illopsoas 5 5    KE L2/3/4 Fem Quad 5 5   DF L4/5 D Peron Tib Ant 5 5   PF S1/2 Tibial Grc/Sol 5 5    Sensation:  Light touch Intact throughout   Pin prick    Temperature    Vibration   Proprioception    Coordination/Complex Motor:  - Finger to Nose intact bilaterally - Heel to shin intact bilaterally - Rapid alternating movement are normal - Gait: Deferred for patient safety.   Labs/Imaging/Neurodiagnostic studies   CBC:  Recent Labs  Lab 28-Aug-2023 1306  WBC 6.3  HGB 14.0  HCT 41.9  MCV 91.1  PLT 151   Basic Metabolic Panel:  Lab Results  Component Value Date   NA 137 08-28-23   K 4.0 08/28/2023   CO2 27 2023-08-28   GLUCOSE 169 (H) August 28, 2023   BUN 10 08-28-23   CREATININE 0.86 08/28/23   CALCIUM 8.8 (L) 28-Aug-2023   GFRNONAA >60 08-28-23   GFRAA >60 01/20/2020   Lipid Panel:  Lab Results  Component Value Date   LDLCALC 36 10/27/2022   HgbA1c:  Lab Results  Component Value Date   HGBA1C 6.4 (A) 05/05/2023   Urine Drug Screen:     Component Value Date/Time   LABOPIA NONE DETECTED 01/19/2020 1455   COCAINSCRNUR NONE DETECTED 01/19/2020 1455   LABBENZ NONE DETECTED 01/19/2020 1455   AMPHETMU NONE DETECTED 01/19/2020 1455   THCU NONE DETECTED 01/19/2020 1455   LABBARB NONE DETECTED 01/19/2020 1455    Alcohol Level     Component Value Date/Time   ETH <10 01/19/2020 1455   INR  Lab Results  Component Value Date   INR 1.1 01/19/2020   APTT  Lab Results  Component Value Date   APTT 27 01/19/2020   AED levels: No results found for: "PHENYTOIN", "ZONISAMIDE", "LAMOTRIGINE", "LEVETIRACETA"  CT angio Head and Neck with contrast(Personally reviewed): Focal moderate stenosis of the V4 segment right vertebral artery. Moderate stenosis of the bilateral supraclinoid ICAs.   Atherosclerosis in the neck as above. Approximately 65% stenosis at the origin of the right cervical ICA and 60% stenosis at the origin of the left cervical ICA.   Stenosis at the  origins of the vertebral arteries, severe on the left and moderate on the right.   Remote lacunar infarct in the left thalamus.  MRI Brain(Personally reviewed): 10 mm focus of acute infarct in the right centrum semiovale. Additional punctate foci of acute infarct involving the right precentral gyrus near the hand knob as well as the posterior right parietal cortex and lateral right temporal cortex.   Nonspecific white matter signal abnormality likely reflecting mild chronic microvascular ischemic changes.   Mild parenchymal volume loss.   Multiple remote infarcts as above.   Susceptibility over the left  parietal cortex is new since 2021 suggestive of remote hemorrhage. Chronic microhemorrhage again noted in the left cerebellum.  ASSESSMENT   KISHAWN PICKAR is a 78 y.o. male with hx of CAD, hypertension, hyperlipidemia, DM2, prior TIAs, GERD, prior history of alcohol use who presents with difficulty using his right hand. This has resolved and he has no deficits.  MRI of the brain without contrast which demonstrated right precentral gyrus stroke along with right posterior parietal cortex and right lateral temporal cortex infarct.  He had CT angio of the head and neck which demonstrated bulky plaque in the carotids bilaterally with about 65% stenosis in the right ICA and about 60% stenosis in left ICA.  I suspect that he probably had a left hemispheric TIA with resultant episode of right hand weakness.  Suspect that is likely secondary to the plaque in the left ICA.  He also has right hemispheric cortical strokes which I suspect are likely secondary to the bulky plaque in the right ICA. RECOMMENDATIONS  - Frequent Neuro checks per stroke unit protocol - Recommend obtaining TTE  - Recommend obtaining Lipid panel with LDL - Please start statin if LDL > 70 - Recommend HbA1c to evaluate for diabetes and how well it is controlled. - Antithrombotic -continue aspirin 81 mg daily on the  left since her milligrams daily - Recommend DVT ppx - SBP goal - aim for gradual normotension. - Recommend Telemetry monitoring for arrythmia - Recommend bedside swallow screen prior to PO intake. - Stroke education booklet - Recommend PT/OT/SLP consult - will likely need to consult vascular surgery to evaluate for potential intervention. Will defer to stroke team who will evaluate him this AM.  ______________________________________________________________________    Signed, Erick Blinks, MD [facility]

## 2023-08-14 NOTE — Progress Notes (Signed)
 Echocardiogram 2D Echocardiogram has been performed.  Kevin Webb 08/14/2023, 3:24 PM

## 2023-08-14 NOTE — Progress Notes (Signed)
 PROGRESS NOTE  Kevin Webb:[id number] DOB: May 13, 1946   PCP: Dana Allan, MD  Patient is from: Home.  Independently ambulates at baseline.  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  Had difficulty picking up the razor with his right hand on 3/27 that lasted few minutes and resolved.  Eventually able to grasp the razor and shave.  And had some blurry vision and incoordination with mobility at work that has resolved after he took a nap.  He has not had further symptoms but concerned after he read about stroke and decided to come to ED.  Patient is on Plavix and aspirin with good compliance.  Drinks 3-4 beers and 4 ounces of whiskey a day.  Does not smoke.  In ED, bradycardic to 40s and 50s.  BMP and CBC without significant finding.  Serial troponin negative.  EKG showed sinus bradycardia without acute ischemic changes.  CXR and CT head without acute finding.  MRI brain showed a 10 mm focus of acute infarct in the right centrum semiovale, additional punctate foci of acute infarct involving the right precentral gyrus near the hand knob as well as posterior right parietal cortex and lateral right temporal cortex.  CT angio head and neck showed 65% right cervical ICA stenosis and 60% left cervical ICA stenosis.  Neurology consulted and recommended complete CVA workup and vascular surgery consult.  Subjective: Seen and examined earlier this morning.  No major events overnight of this morning.  No complaints.  No stroke symptoms, chest pain, dyspnea, GI or UTI symptoms.  However, he reports occasional lightheadedness when he stands up.   Objective: Vitals:   08/13/23 2102 08/13/23 2354 08/14/23 0322 08/14/23 1154  BP:  134/77 105/71 136/84 (!) 149/76  Pulse: (!) 59 (!) 46 (!) 48 (!) 58  Resp: 18 18 18    Temp: 97.7 F (36.5 C) 98 F (36.7 C) 98.4 F (36.9 C) 98.3 F (36.8 C)  TempSrc: Oral Oral Oral Oral  SpO2: 98% 98% 99% 95%  Weight:      Height:        Examination:  GENERAL: No apparent distress.  Nontoxic. HEENT: MMM.  Vision and hearing grossly intact.  NECK: Supple.  No apparent JVD.  RESP:  No IWOB.  Fair aeration bilaterally. CVS:  RRR. Heart sounds normal.  ABD/GI/GU: BS+. Abd soft, NTND.  MSK/EXT:  Moves extremities. No apparent deformity. No edema.  SKIN: no apparent skin lesion or wound NEURO: Awake, alert and oriented appropriately. Speech clear. Cranial nerves II-XII  intact. Motor 5/5 in all muscle groups of UE and LE bilaterally, Normal tone. Light sensation intact in all dermatomes of upper and lower ext bilaterally. Patellar reflex symmetric.  No pronator drift.  Finger to nose intact.  Normal gait.  Able to balance on 1 leg. PSYCH: Calm. Normal affect.   Consultants:  Neurology Vascular surgery  Procedures: None  Microbiology summarized: None  Assessment and plan: Acute CVA: Patient had brief difficulty using his right hand followed by blurry vision and gait disturbance on on 3/27.  MRI finding does not explain right hand issue or blurry vision.  CT angio head and neck with 65% right cervical ICA stenosis and  60% left cervical ICA stenosis.  He admits to occasional lightheadedness when he stands up raising concern for presyncope.  Reports good compliance with Plavix and aspirin.  He is also on high intensity statin.  LDL is only 42.  A1c 6.0%.  No history of A-fib.  He is currently asymptomatic with no focal neurodeficit on exam.  -Neurology on board -Continue Plavix, aspirin and statin -Defer BP goals to neurology given ICA stenosis -Vascular surgery consulted -PT/OT/SLP  Bilateral cervical ICA stenosis -Plavix, aspirin and statin as above -Neurology and vascular  surgery on board  CAD status post CABG in 2000 and graft stent in 2018.  LHC in 2021 with severe three-vessel occlusive CAD.  He is on Imdur, Plavix, aspirin, Toprol-XL, ramipril, Crestor and Zetia -Continue Plavix, aspirin, Crestor and Zetia -Other meds on hold to avoid hypotension   Sinus bradycardia: Likely due to Toprol-XL.  EKG sinus bradycardia to 56 with chronic LAD and RBBB -Continue holding Toprol-XL -Continue telemetry -Check TSH  Chronic chest pain: Likely musculoskeletal.  Usually when he wake In the Morning.  Serial Troponin negative.  No acute ischemic finding on EKG.  CXR without acute finding. -Tylenol as needed  Alcohol use disorder: Reports drinking 3-4 beers and about 4 ounces of whiskey a day -Encouraged cessation/moderation.  He is determined to quit. -Continue CIWA with as needed Ativan -Continue thiamine, folic acid and multivitamin  Essential hypertension: BP within acceptable range.  SBP as low as 105 last night -Continue holding home meds -Defer BP course to neurology given his bilateral ICA stenosis  Prediabetes: A1c 6.0%.  Not on meds at home. Recent Labs  Lab 08/14/23 0405 08/14/23 0604 08/14/23 1156  GLUCAP 110* 110* 185*  -Continue SSI-sensitive  Hyperlipidemia: LDL 42 -Continue Crestor and Zetia  BPH without LUTS -Continue Flomax  GERD -Continue PPI  Body mass index is 29.14 kg/m.           DVT prophylaxis:  SCD's Start: 08/14/23 0103  Code Status: Full code Family Communication: None at bedside Level of care: Telemetry Medical Status is: Inpatient Remains inpatient appropriate because: Acute stroke   Final disposition: Likely home once medically stable   55 minutes with more than 50% spent in reviewing records, counseling patient/family and coordinating care.   Sch Meds:  Scheduled Meds:  [START ON 08/15/2023]  stroke: early stages of recovery book   Does not apply Once   aspirin EC  81 mg Oral Q0200   clopidogrel   75 mg Oral Daily   dutasteride  0.5 mg Oral Daily   ezetimibe  10 mg Oral Daily   folic acid  1 mg Oral Daily   insulin aspart  0-5 Units Subcutaneous QHS   insulin aspart  0-9 Units Subcutaneous TID WC   multivitamin with minerals  1 tablet Oral Daily   pantoprazole  40 mg Oral Daily   rosuvastatin  40 mg Oral QHS   tamsulosin  0.4 mg Oral Q0200   thiamine  100 mg Oral Daily   Or   thiamine  100 mg Intravenous Daily   Continuous Infusions: PRN Meds:.LORazepam **OR** LORazepam  Antimicrobials: Anti-infectives (From admission, onward)    None        I have personally reviewed the following labs and images: CBC: Recent Labs  Lab 08/13/23 1306  WBC 6.3  HGB 14.0  HCT 41.9  MCV 91.1  PLT 151   BMP &GFR Recent Labs  Lab 08/13/23 1306  NA 137  K 4.0  CL 103  CO2 27  GLUCOSE 169*  BUN 10  CREATININE 0.86  CALCIUM 8.8*   Estimated Creatinine Clearance: 82.1 mL/min (by C-G formula based on SCr of 0.86 mg/dL). Liver & Pancreas: No results for input(s): "AST", "ALT", "ALKPHOS", "BILITOT", "PROT", "ALBUMIN" in the last 168 hours. No results for input(s): "LIPASE", "AMYLASE" in the last 168 hours. No results for input(s): "AMMONIA" in the last 168 hours. Diabetic: Recent Labs    08/14/23 0552  HGBA1C 6.0*   Recent Labs  Lab 08/14/23 0405 08/14/23 0604 08/14/23 1156  GLUCAP 110* 110* 185*   Cardiac Enzymes: No results for input(s): "CKTOTAL", "CKMB", "CKMBINDEX", "TROPONINI" in the last 168 hours. No results for input(s): "PROBNP" in the last 8760 hours. Coagulation Profile: No results for input(s): "INR", "PROTIME" in the last 168 hours. Thyroid Function Tests: No results for input(s): "TSH", "T4TOTAL", "FREET4", "T3FREE", "THYROIDAB" in the last 72 hours. Lipid Profile: Recent Labs    08/14/23 0552  CHOL 113  HDL 45  LDLCALC 42  TRIG 130  CHOLHDL 2.5   Anemia Panel: No results for input(s): "VITAMINB12", "FOLATE", "FERRITIN", "TIBC", "IRON",  "RETICCTPCT" in the last 72 hours. Urine analysis:    Component Value Date/Time   COLORURINE YELLOW 05/05/2023 1201   APPEARANCEUR Clear 05/27/2023 1459   LABSPEC 1.010 05/05/2023 1201   PHURINE 7.5 05/05/2023 1201   GLUCOSEU Negative 05/27/2023 1459   GLUCOSEU NEGATIVE 05/05/2023 1201   HGBUR NEGATIVE 05/05/2023 1201   BILIRUBINUR Negative 05/27/2023 1459   KETONESUR NEGATIVE 05/05/2023 1201   PROTEINUR Negative 05/27/2023 1459   PROTEINUR NEGATIVE 11/11/2021 1004   UROBILINOGEN 0.2 05/05/2023 1201   NITRITE Negative 05/27/2023 1459   NITRITE NEGATIVE 05/05/2023 1201   LEUKOCYTESUR Negative 05/27/2023 1459   LEUKOCYTESUR NEGATIVE 05/05/2023 1201   Sepsis Labs: Invalid input(s): "PROCALCITONIN", "LACTICIDVEN"  Microbiology: No results found for this or any previous visit (from the past 240 hours).  Radiology Studies: MR BRAIN WO CONTRAST Result Date: 08/13/2023 CLINICAL DATA:  Neuro deficit, weakness notice yesterday, concern for stroke. EXAM: MRI HEAD WITHOUT CONTRAST TECHNIQUE: Multiplanar, multiecho pulse sequences of the brain and surrounding structures were obtained without intravenous contrast. COMPARISON:  CTA head and neck earlier same day. MRI head 01/19/2020. FINDINGS: Brain: There is a 10 mm focus of restricted diffusion in the right centrum semiovale compatible with acute infarct. Additional punctate foci of acute infarct involving the right precentral gyrus in the region of the hand knob, posterior parasagittal right parietal cortex, and lateral right temporal cortex. Curvilinear susceptibility along the left parietal cortex suggestive of remote hemorrhage. No associated edema or mass effect. No findings to suggest acute intracranial hemorrhage. Scattered signal abnormality in the periventricular and subcortical white matter. Remote lacunar infarcts in the bilateral centrum semiovale. Encephalomalacia and gliosis in the left middle frontal gyrus compatible with remote  infarct. Additional small remote right parietal cortical infarct. Remote lacunar infarct in the left thalamus. Chronic microhemorrhage in the left cerebellum. Normal appearance of midline structures. The basilar cisterns are patent. No extra-axial fluid collections. Ventricles: Prominence of the lateral ventricles suggestive of underlying parenchymal volume loss. Vascular: Skull base flow voids are visualized. Skull and upper cervical spine: No focal abnormality. Sinuses/Orbits: Mucosal thickening in the ethmoid and maxillary sinuses. Other: Mastoid air cells are clear. IMPRESSION: 10 mm focus of acute infarct in the right centrum semiovale. Additional punctate foci of acute infarct involving the right precentral gyrus near the hand knob as well as the posterior right parietal cortex and lateral  right temporal cortex. Nonspecific white matter signal abnormality likely reflecting mild chronic microvascular ischemic changes. Mild parenchymal volume loss. Multiple remote infarcts as above. Susceptibility over the left parietal cortex is new since 2021 suggestive of remote hemorrhage. Chronic microhemorrhage again noted in the left cerebellum. Electronically Signed   By: Emily Filbert M.D.   On: 08/13/2023 18:52   CT ANGIO HEAD NECK W WO CM Result Date: 08/13/2023 CLINICAL DATA:  Neuro deficit, concern for stroke, episodes of right hand weakness yesterday. EXAM: CT ANGIOGRAPHY HEAD AND NECK WITH AND WITHOUT CONTRAST TECHNIQUE: Multidetector CT imaging of the head and neck was performed using the standard protocol during bolus administration of intravenous contrast. Multiplanar CT image reconstructions and MIPs were obtained to evaluate the vascular anatomy. Carotid stenosis measurements (when applicable) are obtained utilizing NASCET criteria, using the distal internal carotid diameter as the denominator. RADIATION DOSE REDUCTION: This exam was performed according to the departmental dose-optimization program which  includes automated exposure control, adjustment of the mA and/or kV according to patient size and/or use of iterative reconstruction technique. CONTRAST:  75mL OMNIPAQUE IOHEXOL 350 MG/ML SOLN COMPARISON:  CTA head and neck 01/19/2020. FINDINGS: CT HEAD FINDINGS Brain: No acute intracranial hemorrhage. No CT evidence of acute infarct. Nonspecific hypoattenuation in the periventricular and subcortical white matter favored to reflect chronic microvascular ischemic changes. Redemonstrated remote lacunar infarct in the left thalamus. No edema, mass effect, or midline shift. The basilar cisterns are patent. Ventricles: Ventricles are normal in size and configuration. Vascular: No hyperdense vessel. Redemonstrated atherosclerosis involving the carotid siphons and intracranial vertebral arteries. Additional focal atherosclerosis along the left M1 segment. Skull: No acute or aggressive finding. Sinuses/orbits: Orbits are symmetric. Mucosal thickening and possible mucous retention cyst in the left maxillary sinus. Other: Mastoid air cells are clear. CTA NECK FINDINGS Aortic arch: Standard configuration of the aortic arch. Imaged portion shows no evidence of aneurysm or dissection. Mild atherosclerosis of the aortic arch. Sequelae of CABG. No significant stenosis of the major arch vessel origins. Pulmonary arteries: As permitted by contrast timing, there are no filling defects in the visualized pulmonary arteries. Subclavian arteries: The subclavian arteries are patent bilaterally. Right carotid system: Patent. Mild atherosclerosis along the common carotid artery. Bulky calcified atherosclerosis an additional noncalcified atherosclerotic plaque involving the carotid bifurcation extending into the carotid bulb. There is approximately 65% stenosis at the origin of the right cervical ICA. External carotid artery branches are patent. No evidence of dissection. Left carotid system: Patent. Mild atherosclerosis along the common  carotid artery. Bulky calcified atherosclerosis with additional component of noncalcified atherosclerotic plaque at the carotid bifurcation extending into the carotid bulb. Approximately 60% stenosis at the origin of the left cervical ICA. Left EAC branches are patent. Vertebral arteries: Codominant. Patent from the origins to the vertebrobasilar confluence. Atherosclerosis involving the vertebral artery origins resulting in moderate stenosis on the right and severe stenosis on the left. Additional atherosclerosis of the left V3 segment and bilateral V4 segments. There is moderate focal stenosis of the proximal right V4 segment and mild stenosis of the left V4 segment. No evidence of dissection, stenosis (50% or greater), or occlusion. Skeleton: No acute findings. Degenerative changes in the cervical spine. Sequelae of median sternotomy. Other neck: The visualized airway is patent. No cervical lymphadenopathy. Upper chest: Visualized lung apices are clear. Review of the MIP images confirms the above findings CTA HEAD FINDINGS ANTERIOR CIRCULATION: The intracranial ICAs are patent bilaterally. Atherosclerosis of the bilateral carotid siphons. Mild stenosis of the  bilateral cavernous ICAs. Moderate stenosis of the bilateral supraclinoid ICAs slightly greater on the left. No proximal occlusion, aneurysm, or vascular malformation. MCAs: The middle cerebral arteries are patent bilaterally. ACAs: The anterior cerebral arteries are patent bilaterally. POSTERIOR CIRCULATION: No significant stenosis, proximal occlusion, aneurysm, or vascular malformation. PCAs: The posterior cerebral arteries are patent bilaterally. Pcomm: Not well visualized. SCAs: The superior cerebellar arteries are patent bilaterally. Basilar artery: Patent AICAs: Visualized on the left. PICAs: Visualized bilaterally more pronounced on the right. Vertebral arteries: The intracranial vertebral arteries are patent. Venous sinuses: As permitted by contrast  timing, patent. Anatomic variants: None Review of the MIP images confirms the above findings IMPRESSION: No large vessel occlusion. No CT evidence of acute intracranial abnormality. Focal moderate stenosis of the V4 segment right vertebral artery. Moderate stenosis of the bilateral supraclinoid ICAs. Atherosclerosis in the neck as above. Approximately 65% stenosis at the origin of the right cervical ICA and 60% stenosis at the origin of the left cervical ICA. Stenosis at the origins of the vertebral arteries, severe on the left and moderate on the right. Remote lacunar infarct in the left thalamus. Electronically Signed   By: Emily Filbert M.D.   On: 08/13/2023 17:19   DG Chest 2 View Result Date: 08/13/2023 CLINICAL DATA:  Chest pain EXAM: CHEST - 2 VIEW COMPARISON:  Chest radiograph dated 12/30/2022 FINDINGS: Normal lung volumes. No focal consolidations. No pleural effusion or pneumothorax. Similar postsurgical cardiomediastinal silhouette. Median sternotomy wires are nondisplaced. IMPRESSION: No active cardiopulmonary disease. Electronically Signed   By: Agustin Cree M.D.   On: 08/13/2023 14:32      Alixandrea Milleson T. Nigel Wessman [name]  If 7PM-7AM, please contact night-coverage [email or web address] 08/14/2023, 12:23 PM

## 2023-08-14 NOTE — Progress Notes (Signed)
 OT Cancellation Note  Patient Details Name: Kevin Webb MRN: [id number] DOB: 05-15-1946   Cancelled Treatment:    Reason Eval/Treat Not Completed: OT screened, no needs identified, will sign off. Per PT all symptoms resolved with no residual UE weakness or numbness and pt independent. Please re-consult if change in status.   Kevin Webb, Kevin Webb [facility] Acute Rehabilitation Office: [phone]   Myrla Halsted 08/14/2023, 1:56 PM

## 2023-08-14 NOTE — Plan of Care (Signed)
 Pt is alert and ambulatory independently and his speech is clear. Pt states that he is "blessed to have little to no deficits" from current stroke.

## 2023-08-14 NOTE — Progress Notes (Addendum)
 STROKE TEAM PROGRESS NOTE    INTERIM HISTORY/SUBJECTIVE  MRI brain demonstrated right precentral gyrus stroke, right posterior parietal cortex and right lateral temporal cortex infarcts.   CT angio shows 65% stenosis right ICA, 60% stenosis left ICA.  Vascular surgery is consulted  On exam, patient is ambulating in room without difficulty, no focal weakness seen, no sensory deficits.  He states that his right hand was not necessarily weak before coming in but he did have slightly blurry vision and some dizziness.     States that he came to the ED in 2021 for left arm weakness (this is notated in the chart as right arm weakness). All imaging was negative.  OBJECTIVE  CBC    Component Value Date/Time   WBC 6.3 08/13/2023 1306   RBC 4.60 08/13/2023 1306   HGB 14.0 08/13/2023 1306   HGB 14.5 11/28/2019 1049   HCT 41.9 08/13/2023 1306   HCT 42.3 11/28/2019 1049   PLT 151 08/13/2023 1306   PLT 163 11/28/2019 1049   MCV 91.1 08/13/2023 1306   MCV 91 11/28/2019 1049   MCV 92 06/12/2012 0540   MCH 30.4 08/13/2023 1306   MCHC 33.4 08/13/2023 1306   RDW 14.4 08/13/2023 1306   RDW 13.7 11/28/2019 1049   RDW 13.3 06/12/2012 0540   LYMPHSABS 1.1 10/27/2022 0906   MONOABS 0.5 10/27/2022 0906   EOSABS 0.1 10/27/2022 0906   BASOSABS 0.0 10/27/2022 0906    BMET    Component Value Date/Time   NA 137 08/13/2023 1306   NA 138 11/28/2019 1049   NA 139 06/12/2012 0540   K 4.0 08/13/2023 1306   K 3.8 06/12/2012 0540   CL 103 08/13/2023 1306   CL 108 (H) 06/12/2012 0540   CO2 27 08/13/2023 1306   CO2 21 06/12/2012 0540   GLUCOSE 169 (H) 08/13/2023 1306   GLUCOSE 128 (H) 06/12/2012 0540   BUN 10 08/13/2023 1306   BUN 10 11/28/2019 1049   BUN 12 06/12/2012 0540   CREATININE 0.86 08/13/2023 1306   CREATININE 0.91 09/27/2015 1414   CALCIUM 8.8 (L) 08/13/2023 1306   CALCIUM 8.4 (L) 06/12/2012 0540   GFRNONAA >60 08/13/2023 1306   GFRNONAA >60 06/12/2012 0540    IMAGING past 24  hours ECHOCARDIOGRAM COMPLETE Result Date: 08/14/2023    ECHOCARDIOGRAM REPORT   Patient Name:   Kevin Webb Date of Exam: 08/14/2023 Medical Rec #:  [id number]      Height:       69.0 in Accession #:    [id number]     Weight:       197.3 lb Date of Birth:  [birth year]      BSA:          2.054 m Patient Age:    77 years       BP:           136/84 mmHg Patient Gender: M              HR:           55 bpm. Exam Location:  Inpatient Procedure: 2D Echo, Cardiac Doppler, Color Doppler and Intracardiac            Opacification Agent (Both Spectral and Color Flow Doppler were            utilized during procedure). Indications:    Stroke I63.9  History:        Patient has no prior history of Echocardiogram examinations.  Angina and CAD, Stroke, Arrythmias:Bradycardia,                 Signs/Symptoms:Chest Pain; Risk Factors:Hypertension, Diabetes                 and Dyslipidemia.  Sonographer:    Lucendia Herrlich RCS Referring Phys: [id number] VASUNDHRA RATHORE IMPRESSIONS  1. Left ventricular ejection fraction, by estimation, is 60 to 65%. The left ventricle has normal function. The left ventricle has no regional wall motion abnormalities. Left ventricular diastolic parameters are indeterminate.  2. Right ventricular systolic function is low normal. The right ventricular size is Dilated.  3. Left atrial size was mildly dilated.  4. The mitral valve is normal in structure. No evidence of mitral valve regurgitation.  5. The aortic valve is tricuspid. Aortic valve regurgitation is mild. No aortic stenosis is present.  6. The inferior vena cava is normal in size with greater than 50% respiratory variability, suggesting right atrial pressure of 3 mmHg. Comparison(s): No prior Echocardiogram. FINDINGS  Left Ventricle: No ventricular strain or 3D. Left ventricular ejection fraction, by estimation, is 60 to 65%. The left ventricle has normal function. The left ventricle has no regional wall motion abnormalities.  Definity contrast agent was given IV to delineate the left ventricular endocardial borders. Strain was performed and the global longitudinal strain is indeterminate. The left ventricular internal cavity size was normal in size. There is no left ventricular hypertrophy. Left ventricular diastolic parameters are indeterminate. Right Ventricle: The right ventricular size is Dilated. No increase in right ventricular wall thickness. Right ventricular systolic function is low normal. Left Atrium: Left atrial size was mildly dilated. Right Atrium: Right atrial size was normal in size. Pericardium: There is no evidence of pericardial effusion. Mitral Valve: The mitral valve is normal in structure. No evidence of mitral valve regurgitation. Tricuspid Valve: The tricuspid valve is normal in structure. Tricuspid valve regurgitation is mild . No evidence of tricuspid stenosis. Aortic Valve: The aortic valve is tricuspid. Aortic valve regurgitation is mild. Aortic regurgitation PHT measures 564 msec. No aortic stenosis is present. Aortic valve peak gradient measures 9.5 mmHg. Pulmonic Valve: The pulmonic valve was normal in structure. Pulmonic valve regurgitation is not visualized. No evidence of pulmonic stenosis. Aorta: The aortic root and ascending aorta are structurally normal, with no evidence of dilitation. Venous: The inferior vena cava is normal in size with greater than 50% respiratory variability, suggesting right atrial pressure of 3 mmHg. IAS/Shunts: The interatrial septum was not well visualized. Additional Comments: 3D was performed not requiring image post processing on an independent workstation and was indeterminate.  LEFT VENTRICLE PLAX 2D LVIDd:         5.60 cm   Diastology LVIDs:         3.60 cm   LV e' medial:    7.40 cm/s LV PW:         1.10 cm   LV E/e' medial:  9.9 LV IVS:        0.90 cm   LV e' lateral:   13.70 cm/s LVOT diam:     2.10 cm   LV E/e' lateral: 5.3 LV SV:         82 LV SV Index:   40 LVOT  Area:     3.46 cm  RIGHT VENTRICLE            IVC RV S prime:     7.94 cm/s  IVC diam: 1.20 cm LEFT ATRIUM  Index        RIGHT ATRIUM           Index LA diam:        4.40 cm 2.14 cm/m   RA Area:     15.30 cm LA Vol (A2C):   71.3 ml 34.71 ml/m  RA Volume:   37.10 ml  18.06 ml/m LA Vol (A4C):   82.8 ml 40.31 ml/m LA Biplane Vol: 82.3 ml 40.07 ml/m  AORTIC VALVE AV Area (Vmax): 2.20 cm AV Vmax:        154.00 cm/s AV Peak Grad:   9.5 mmHg LVOT Vmax:      97.73 cm/s LVOT Vmean:     68.033 cm/s LVOT VTI:       0.236 m AI PHT:         564 msec  AORTA Ao Root diam: 3.80 cm Ao Asc diam:  3.90 cm MITRAL VALVE               TRICUSPID VALVE MV Area (PHT): 3.37 cm    TR Peak grad:   22.1 mmHg MV Decel Time: 225 msec    TR Vmax:        235.00 cm/s MR Peak grad: 28.3 mmHg MR Vmax:      266.00 cm/s  SHUNTS MV E velocity: 73.20 cm/s  Systemic VTI:  0.24 m MV A velocity: 80.20 cm/s  Systemic Diam: 2.10 cm MV E/A ratio:  0.91 Riley Lam MD Electronically signed by Riley Lam MD Signature Date/Time: 08/14/2023/1:07:45 PM    Final    MR BRAIN WO CONTRAST Result Date: 08/13/2023 CLINICAL DATA:  Neuro deficit, weakness notice yesterday, concern for stroke. EXAM: MRI HEAD WITHOUT CONTRAST TECHNIQUE: Multiplanar, multiecho pulse sequences of the brain and surrounding structures were obtained without intravenous contrast. COMPARISON:  CTA head and neck earlier same day. MRI head 01/19/2020. FINDINGS: Brain: There is a 10 mm focus of restricted diffusion in the right centrum semiovale compatible with acute infarct. Additional punctate foci of acute infarct involving the right precentral gyrus in the region of the hand knob, posterior parasagittal right parietal cortex, and lateral right temporal cortex. Curvilinear susceptibility along the left parietal cortex suggestive of remote hemorrhage. No associated edema or mass effect. No findings to suggest acute intracranial hemorrhage. Scattered signal  abnormality in the periventricular and subcortical white matter. Remote lacunar infarcts in the bilateral centrum semiovale. Encephalomalacia and gliosis in the left middle frontal gyrus compatible with remote infarct. Additional small remote right parietal cortical infarct. Remote lacunar infarct in the left thalamus. Chronic microhemorrhage in the left cerebellum. Normal appearance of midline structures. The basilar cisterns are patent. No extra-axial fluid collections. Ventricles: Prominence of the lateral ventricles suggestive of underlying parenchymal volume loss. Vascular: Skull base flow voids are visualized. Skull and upper cervical spine: No focal abnormality. Sinuses/Orbits: Mucosal thickening in the ethmoid and maxillary sinuses. Other: Mastoid air cells are clear. IMPRESSION: 10 mm focus of acute infarct in the right centrum semiovale. Additional punctate foci of acute infarct involving the right precentral gyrus near the hand knob as well as the posterior right parietal cortex and lateral right temporal cortex. Nonspecific white matter signal abnormality likely reflecting mild chronic microvascular ischemic changes. Mild parenchymal volume loss. Multiple remote infarcts as above. Susceptibility over the left parietal cortex is new since 2021 suggestive of remote hemorrhage. Chronic microhemorrhage again noted in the left cerebellum. Electronically Signed   By: Emily Filbert M.D.   On: 08/13/2023 18:52  CT ANGIO HEAD NECK W WO CM Result Date: 08/13/2023 CLINICAL DATA:  Neuro deficit, concern for stroke, episodes of right hand weakness yesterday. EXAM: CT ANGIOGRAPHY HEAD AND NECK WITH AND WITHOUT CONTRAST TECHNIQUE: Multidetector CT imaging of the head and neck was performed using the standard protocol during bolus administration of intravenous contrast. Multiplanar CT image reconstructions and MIPs were obtained to evaluate the vascular anatomy. Carotid stenosis measurements (when applicable) are  obtained utilizing NASCET criteria, using the distal internal carotid diameter as the denominator. RADIATION DOSE REDUCTION: This exam was performed according to the departmental dose-optimization program which includes automated exposure control, adjustment of the mA and/or kV according to patient size and/or use of iterative reconstruction technique. CONTRAST:  75mL OMNIPAQUE IOHEXOL 350 MG/ML SOLN COMPARISON:  CTA head and neck 01/19/2020. FINDINGS: CT HEAD FINDINGS Brain: No acute intracranial hemorrhage. No CT evidence of acute infarct. Nonspecific hypoattenuation in the periventricular and subcortical white matter favored to reflect chronic microvascular ischemic changes. Redemonstrated remote lacunar infarct in the left thalamus. No edema, mass effect, or midline shift. The basilar cisterns are patent. Ventricles: Ventricles are normal in size and configuration. Vascular: No hyperdense vessel. Redemonstrated atherosclerosis involving the carotid siphons and intracranial vertebral arteries. Additional focal atherosclerosis along the left M1 segment. Skull: No acute or aggressive finding. Sinuses/orbits: Orbits are symmetric. Mucosal thickening and possible mucous retention cyst in the left maxillary sinus. Other: Mastoid air cells are clear. CTA NECK FINDINGS Aortic arch: Standard configuration of the aortic arch. Imaged portion shows no evidence of aneurysm or dissection. Mild atherosclerosis of the aortic arch. Sequelae of CABG. No significant stenosis of the major arch vessel origins. Pulmonary arteries: As permitted by contrast timing, there are no filling defects in the visualized pulmonary arteries. Subclavian arteries: The subclavian arteries are patent bilaterally. Right carotid system: Patent. Mild atherosclerosis along the common carotid artery. Bulky calcified atherosclerosis an additional noncalcified atherosclerotic plaque involving the carotid bifurcation extending into the carotid bulb. There is  approximately 65% stenosis at the origin of the right cervical ICA. External carotid artery branches are patent. No evidence of dissection. Left carotid system: Patent. Mild atherosclerosis along the common carotid artery. Bulky calcified atherosclerosis with additional component of noncalcified atherosclerotic plaque at the carotid bifurcation extending into the carotid bulb. Approximately 60% stenosis at the origin of the left cervical ICA. Left EAC branches are patent. Vertebral arteries: Codominant. Patent from the origins to the vertebrobasilar confluence. Atherosclerosis involving the vertebral artery origins resulting in moderate stenosis on the right and severe stenosis on the left. Additional atherosclerosis of the left V3 segment and bilateral V4 segments. There is moderate focal stenosis of the proximal right V4 segment and mild stenosis of the left V4 segment. No evidence of dissection, stenosis (50% or greater), or occlusion. Skeleton: No acute findings. Degenerative changes in the cervical spine. Sequelae of median sternotomy. Other neck: The visualized airway is patent. No cervical lymphadenopathy. Upper chest: Visualized lung apices are clear. Review of the MIP images confirms the above findings CTA HEAD FINDINGS ANTERIOR CIRCULATION: The intracranial ICAs are patent bilaterally. Atherosclerosis of the bilateral carotid siphons. Mild stenosis of the bilateral cavernous ICAs. Moderate stenosis of the bilateral supraclinoid ICAs slightly greater on the left. No proximal occlusion, aneurysm, or vascular malformation. MCAs: The middle cerebral arteries are patent bilaterally. ACAs: The anterior cerebral arteries are patent bilaterally. POSTERIOR CIRCULATION: No significant stenosis, proximal occlusion, aneurysm, or vascular malformation. PCAs: The posterior cerebral arteries are patent bilaterally. Pcomm: Not well visualized. SCAs:  The superior cerebellar arteries are patent bilaterally. Basilar artery:  Patent AICAs: Visualized on the left. PICAs: Visualized bilaterally more pronounced on the right. Vertebral arteries: The intracranial vertebral arteries are patent. Venous sinuses: As permitted by contrast timing, patent. Anatomic variants: None Review of the MIP images confirms the above findings IMPRESSION: No large vessel occlusion. No CT evidence of acute intracranial abnormality. Focal moderate stenosis of the V4 segment right vertebral artery. Moderate stenosis of the bilateral supraclinoid ICAs. Atherosclerosis in the neck as above. Approximately 65% stenosis at the origin of the right cervical ICA and 60% stenosis at the origin of the left cervical ICA. Stenosis at the origins of the vertebral arteries, severe on the left and moderate on the right. Remote lacunar infarct in the left thalamus. Electronically Signed   By: Emily Filbert M.D.   On: 08/13/2023 17:19    Vitals:   08/13/23 2102 08/13/23 2354 08/14/23 0322 08/14/23 1154  BP: 134/77 105/71 136/84 (!) 149/76  Pulse: (!) 59 (!) 46 (!) 48 (!) 58  Resp: 18 18 18    Temp: 97.7 F (36.5 C) 98 F (36.7 C) 98.4 F (36.9 C) 98.3 F (36.8 C)  TempSrc: Oral Oral Oral Oral  SpO2: 98% 98% 99% 95%  Weight:      Height:         PHYSICAL EXAM General:  Alert, well-nourished, well-developed patient in no acute distress Psych:  Mood and affect appropriate for situation CV: Regular rate and rhythm on monitor Respiratory:  Regular, unlabored respirations on room air GI: Abdomen soft and nontender   NEURO:  Mental Status: AA&Ox3, patient is able to give clear and coherent history Speech/Language: speech is without dysarthria or aphasia.  Naming, repetition, fluency, and comprehension intact.  Cranial Nerves:  II: PERRL. Visual fields full.  III, IV, VI: EOMI. Eyelids elevate symmetrically.  V: Sensation is intact to light touch and symmetrical to face.  VII: Face is symmetrical resting and smiling VIII: hearing intact to voice. IX,  X: Palate elevates symmetrically. Phonation is normal.  ZO:[id number] shrug 5/5. XII: tongue is midline without fasciculations. Motor: 5/5 strength to all muscle groups tested.  Tone: is normal and bulk is normal Sensation- Intact to light touch bilaterally. Extinction absent to light touch to DSS.   Coordination: FTN intact bilaterally, HKS: no ataxia in BLE.No drift.  Gait- deferred  Most Recent NIH: 0    ASSESSMENT/PLAN  [name] is a 78 y.o. male with hx of CAD, HTN,HLD, DM2, prior TIAs, prior history of alcohol use who presents with difficulty using his right hand. MRI: right precentral gyrus stroke along with right posterior parietal cortex and right lateral temporal cortex infarct.CT angio demonstrated bulky plaque in the carotids bilaterally with about 65% stenosis in the right ICA and about 60% stenosis in left ICA. Admitted for Stroke Work-up.  NIH on Admission: 0.  Stroke: Right temporal lobe and centrum Semi-Ovale several small infarcts, etiology likely symptomatic right ICA stenosis, large vessel disease CT head: No acute abnormality.  Remote lacunar infarct left thalamus.    CTA head & neck: No LVO Approximately 65% stenosis at the origin of the right cervical ICA and 60% stenosis at the origin of the left cervical ICA MRI  10 mm focus of acute infarct in the right centrum semiovale. Additional punctate foci of acute infarct involving the right precentral gyrus near the hand knob as well as the posterior right parietal cortex and lateral right temporal cortex. 2D Echo: LVEF  60-65%, Mildly dilated LA LDL 42 HgbA1c 6.0 VTE prophylaxis - SCDs No antithrombotic prior to admission, now on aspirin 81 mg daily and clopidogrel 75 mg daily.  Final regimen per medical surgery Therapy recommendations:  No follow up needed  Disposition: Pending  Hx of Stroke/TIA ED Visit 2021, noted in chart as RUE weakness, patient states it was transient LUE weakness. All imaging negative for  acute abnormality. MRI showed chronic small vessel disease with small chronic infarcts. CTA showed 60% proximal left ICA stenosis  Carotid stenosis CTA head & neck: No LVO Approximately 65% stenosis at the origin of the right cervical ICA and 60% stenosis at the origin of the left cervical ICA Vascular surgery has been consulted May consider right CEA versus TCAR  Hypertension Home meds:  Toprol-XL 25mg  daily, amlodipine 5mg  Stable Avoid low BP given carotid stenosis Long-term BP goal normotensive  Hyperlipidemia Home meds: Crestor 40 mg, Zetia 10 mg, resumed in hospital LDL 42, goal < 70 Continue zetia and statin at discharge  Diabetes type II Controlled Home meds:  none HgbA1c 6.0, goal < 7.0 CBGs SSI Recommend close follow-up with PCP   Other Stroke Risk Factors Advanced age Family hx stroke (mother) Coronary artery disease, and NSTEMI 2018 Alcohol abuse, 3-4 beer (12 ounce) daily, recommend no more than 2 drinks per day, currently on B1/FA/multivitamin  Other Active Problems   Hospital day # 1   Pt seen by Neuro NP/APP and later by MD. Note/plan to be edited by MD as needed.    Lynnae January, DNP, AGACNP-BC [facility] Please use AMION for contact information & EPIC for messaging.  ATTENDING NOTE: I reviewed above note and agree with the assessment and plan. Pt was seen and examined.   Wife at bedside.  Patient sitting at edge of bed, neuro intact, no focal deficit.  He stated that he had blurry vision and generalized weakness yesterday, did not notice significant right side symptoms.  Currently symptoms are resolved.  Had transient left upper extremity weakness and pain in 01/2020, concerning TIA versus cervical radiculopathy at the time.  CTA showed bilateral ICA stenosis 60 to 65%.  Vascular surgery consulted, may consider right CEA versus TCAR.  On DAPT and statin.  Will follow  For detailed assessment and plan, please refer to above/below as I have  made changes wherever appropriate.   Marvel Plan, MD PhD Stroke Neurology 08/14/2023 3:32 PM  I discussed with Dr. Caryn Section [facility]. I spent additional inpatient 30 minutes face-to-face time with the patient, more than 50% of which was spent in counseling and coordination of care, reviewing test results, images and medication, and discussing the diagnosis, treatment plan and potential prognosis. This patient's care requiresreview of multiple databases, neurological assessment, discussion with family, other specialists and medical decision making of high complexity.       To contact Stroke Continuity provider, please refer to [email or web address]. After hours, contact General Neurology

## 2023-08-14 NOTE — Evaluation (Signed)
 Speech Language Pathology Evaluation Patient Details Name: Kevin Webb MRN: [id number] DOB: [birth year] Today's Date: 08/14/2023 Time: 4098-1191 SLP Time Calculation (min) (ACUTE ONLY): 17 min  Problem List:  Patient Active Problem List   Diagnosis Date Noted   Acute ischemic stroke (HCC) 08/13/2023   Type 2 diabetes mellitus (HCC) 05/12/2023   Pain in left testicle 05/06/2023   CAD (coronary artery disease) 12/30/2022   Diabetic eye exam (HCC) 11/08/2022   EtOH dependence (HCC) 05/04/2022   Hx of CABG 12/25/2020   Hypertension associated with diabetes (HCC) 03/26/2020   Left carotid artery stenosis 02/02/2020   Obesity (BMI 30-39.9) 09/22/2019   Aortic atherosclerosis (HCC) 06/20/2019   Diverticulosis 06/20/2019   Cervicalgia 03/29/2018   Vitamin D deficiency    Sinus bradycardia    Peyronie disease    Osteoarthritis of right knee    Hyperlipidemia    Gastroesophageal reflux disease    Diverticulitis    Coronary artery disease of native artery of native heart with stable angina pectoris (HCC)    BPH associated with nocturia    Hyperlipidemia LDL goal <70 01/08/2017   Chest pain 04/18/2013   Past Medical History:  Past Medical History:  Diagnosis Date   Acute cholecystitis 04/18/2013   Adenomatous colon polyp 12/2004   Adenomatous colon polyp 12/16/2004   Anemia    Anemia    Blood transfusion without reported diagnosis    BPH associated with nocturia    CAD (coronary artery disease)    a. s/p CABG x4 in 2000 with LIMA-LAD, reverse SVG-IM, reverse SVG-acute margin and reverse SVG-RCA b. s/p BMS to SVG-RI in 2012 c. 12/2016: PCI/DES to SVG-RCA and PCI/DES to SVG-RI (Dr. Antoine Poche)    Carotid artery stenosis    mild    Cataract    Cervical radiculopathy 03/26/2020   Clotting disorder (HCC)    COLONIC POLYPS, HX OF 09/30/2006   Qualifier: Diagnosis of   By: Alwyn Ren MD, William          Polypectomy 2004, 2009; Dr Russella Dar         Coronary atherosclerosis of artery bypass  graft 09/30/2006   Qualifier: Diagnosis of   By: Alwyn Ren MD, William       Diabetes mellitus without complication (HCC) 09/28/2016   recent A1C 6.1 12/25/16 controlled with diet and exercise    Diverticulitis 2008/2009   Diverticulitis    Dizziness 04/21/2020   Educated about COVID-19 virus infection 10/06/2018   GERD 02/22/2007   Qualifier: Diagnosis of   By: Alwyn Ren MD, Chrissie Noa       GERD (gastroesophageal reflux disease)    controlled as of 04/01/17    HSV infection    HTN (hypertension)    HTN (hypertension) 09/30/2006   Qualifier: Diagnosis of   By: Alwyn Ren MD, William       Hx of CABG 12/25/2020   Hx of dysplastic nevus 05/04/2018   upper back spinal    Hyperglycemia    Hyperlipidemia    Hyperlipidemia LDL goal <70 01/08/2017   Impingement syndrome of left shoulder 2016   Impingement syndrome of left shoulder 05/18/2014   Kidney cysts 06/20/2019   Low back pain 02/15/2019   Low testosterone    Dr Evelene Croon, [facility] ,[place]   Myocardial infarction [facility])    NSTEMI (non-ST elevated myocardial infarction) (HCC) 12/25/2016   Osteoarthritis of right knee    With trace effusion   Peyronie disease    Piriformis syndrome of right side 09/22/2019   Prediabetes 03/29/2018  Prostatitis 12/23/2017   Right arm weakness 02/02/2020   RLS (restless legs syndrome) 08/15/2013   RLS (restless legs syndrome) 08/15/2013   Rotator cuff injury    right; at 6-84 years old.    Sinus bradycardia    Sinus bradycardia    Thrombocytopenia (HCC)    TIA (transient ischemic attack) 01/19/2020   Tubular adenoma of colon 06/26/2020   Type 2 diabetes mellitus without complication, without long-term current use of insulin (HCC) 09/28/2016   Unstable angina (HCC) 12/01/2019   Vitamin D deficiency    Past Surgical History:  Past Surgical History:  Procedure Laterality Date   CHOLECYSTECTOMY N/A 04/21/2013   Procedure: LAPAROSCOPIC CHOLECYSTECTOMY WITH INTRAOPERATIVE CHOLANGIOGRAM;  Surgeon: Currie Paris, MD;  Location: [facility] OR;  Service: General;  Laterality: N/A;   CHOLECYSTECTOMY     COLONOSCOPY W/ POLYPECTOMY  2004 & 2009    X 2; Dr Russella Dar; due 2014   CORONARY ANGIOPLASTY     CORONARY ARTERY BYPASS GRAFT  04/1999   4 vessels   CORONARY STENT INTERVENTION N/A 12/28/2016   Procedure: CORONARY STENT INTERVENTION;  Surgeon: Runell Gess, MD;  Location: [facility] INVASIVE CV LAB;  Service: Cardiovascular;  Laterality: N/A;   CORONARY STENT PLACEMENT  2012   Plavix   LAPAROSCOPIC CHOLECYSTECTOMY  04/21/2013   Dr Jamey Ripa; acutecholecystitis with necrosis   LEFT HEART CATH AND CORS/GRAFTS ANGIOGRAPHY N/A 12/28/2016   Procedure: LEFT HEART CATH AND CORS/GRAFTS ANGIOGRAPHY;  Surgeon: Runell Gess, MD;  Location: [facility] INVASIVE CV LAB;  Service: Cardiovascular;  Laterality: N/A;   LEFT HEART CATH AND CORS/GRAFTS ANGIOGRAPHY N/A 12/01/2019   Procedure: LEFT HEART CATH AND CORS/GRAFTS ANGIOGRAPHY;  Surgeon: [place], Peter M, MD;  Location: [facility] INVASIVE CV LAB;  Service: Cardiovascular;  Laterality: N/A;   VASECTOMY     HPI:  Kevin Webb is a 78 y.o. male with medical history significant of CAD status post CABG, hypertension, hyperlipidemia, type 2 diabetes, TIA, BPH, GERD, alcohol use disorder, and other medical comorbidities presenting with a chief complaint of difficulty using his right hand.  Patient states on Thursday, 3/27 he was getting ready for work in the morning when he noticed that he had difficulty picking up the razor with his right hand.  He was eventually able to grasp the razor and shave.  He also had some blurry vision and difficulty with coordination/trouble walking at that time.  He thinks all of the symptoms lasted about 30 minutes and resolved and no recurrence since then; MRI on 3/28 revealed 10 mm focus of acute infarct in the right centrum semiovale.  Additional punctate foci of acute infarct involving the right  precentral gyrus near the hand knob as well as the posterior right   parietal cortex and lateral right temporal cortex.  ST consulted for speech/language cognitive assessment.   Assessment / Plan / Recommendation Clinical Impression  Pt seen for speech/language cognitive assessment with portions of [employer] Mental Status Examination (SLUMS), observation, pt/family/nursing report and informal functional assessment means.  Pt presented with intelligible speech within simple conversation, Ox4, OME unremarkable and cognitive ability appeared [facility].  Pt able to provide detailed information re: medication usage, pmhx and current dx.  Simple problem solving and awareness tasks accurate.  Attention/memory tasks completed without difficulty.  No further ST recommended at this time.  ST will s/o in acute setting.  Thank you for this consult.    SLP Assessment  SLP Recommendation/Assessment: Patient does not need any further Speech  Language Pathology Services SLP Visit Diagnosis: Cognitive communication deficit [phone])    Recommendations for follow up therapy are one component of a multi-disciplinary discharge planning process, led by the attending physician.  Recommendations may be updated based on patient status, additional functional criteria and insurance authorization.    Follow Up Recommendations  No SLP follow up    Assistance Recommended at Discharge   TBD  Functional Status Assessment Patient has had a recent decline in their functional status and demonstrates the ability to make significant improvements in function in a reasonable and predictable amount of time.  Frequency and Duration Other (Comment) (evaluation only)         SLP Evaluation Cognition  Overall Cognitive Status: Within Functional Limits for tasks assessed Arousal/Alertness: Awake/alert Orientation Level: Oriented X4 Year: 2025 Month: March Day of Week: Correct Attention: Sustained Sustained Attention: Appears intact Memory: Appears intact Awareness: Appears intact Problem  Solving: Appears intact Safety/Judgment: Appears intact       Comprehension  Auditory Comprehension Overall Auditory Comprehension: Appears within functional limits for tasks assessed Commands: Not tested Conversation: Simple Visual Recognition/Discrimination Discrimination: Not tested Reading Comprehension Reading Status: Not tested    Expression Expression Primary Mode of Expression: Verbal Verbal Expression Overall Verbal Expression: Appears within functional limits for tasks assessed Level of Generative/Spontaneous Verbalization: Conversation Pragmatics: No impairment Non-Verbal Means of Communication: Not applicable Written Expression Written Expression: Not tested   Oral / Motor  Oral Motor/Sensory Function Overall Oral Motor/Sensory Function: Within functional limits Motor Speech Overall Motor Speech: Appears within functional limits for tasks assessed Respiration: Within functional limits Phonation: Normal Resonance: Within functional limits Articulation: Within functional limitis Intelligibility: Intelligible Motor Planning: Witnin functional limits Motor Speech Errors: Not applicable            Pat Jermari Tamargo,M.S.,CCC-SLP 08/14/2023, 11:14 AM

## 2023-08-15 DIAGNOSIS — I10 Essential (primary) hypertension: Secondary | ICD-10-CM | POA: Diagnosis not present

## 2023-08-15 DIAGNOSIS — I63231 Cerebral infarction due to unspecified occlusion or stenosis of right carotid arteries: Secondary | ICD-10-CM | POA: Diagnosis not present

## 2023-08-15 DIAGNOSIS — E785 Hyperlipidemia, unspecified: Secondary | ICD-10-CM | POA: Diagnosis not present

## 2023-08-15 DIAGNOSIS — E119 Type 2 diabetes mellitus without complications: Secondary | ICD-10-CM

## 2023-08-15 DIAGNOSIS — I639 Cerebral infarction, unspecified: Secondary | ICD-10-CM | POA: Diagnosis not present

## 2023-08-15 DIAGNOSIS — R29700 NIHSS score 0: Secondary | ICD-10-CM | POA: Diagnosis not present

## 2023-08-15 LAB — RENAL FUNCTION PANEL
Albumin: 3.5 g/dL (ref 3.5–5.0)
Anion gap: 12 (ref 5–15)
BUN: 9 mg/dL (ref 8–23)
CO2: 23 mmol/L (ref 22–32)
Calcium: 9.2 mg/dL (ref 8.9–10.3)
Chloride: 105 mmol/L (ref 98–111)
Creatinine, Ser: 0.81 mg/dL (ref 0.61–1.24)
GFR, Estimated: >60 mL/min (ref >60)
Glucose, Bld: 131 mg/dL — ABNORMAL HIGH (ref 70–99)
Phosphorus: 4.4 mg/dL (ref 2.5–4.6)
Potassium: 3.8 mmol/L (ref 3.5–5.1)
Sodium: 140 mmol/L (ref 135–145)

## 2023-08-15 LAB — CBC
HCT: 43.1 % (ref 39.0–52.0)
Hemoglobin: 15 g/dL (ref 13.0–17.0)
MCH: 31.1 pg (ref 26.0–34.0)
MCHC: 34.8 g/dL (ref 30.0–36.0)
MCV: 89.4 fL (ref 80.0–100.0)
Platelets: 141 10*3/uL — ABNORMAL LOW (ref 150–400)
RBC: 4.82 MIL/uL (ref 4.22–5.81)
RDW: 14 % (ref 11.5–15.5)
WBC: 4.7 10*3/uL (ref 4.0–10.5)
nRBC: 0 % (ref 0.0–0.2)

## 2023-08-15 LAB — GLUCOSE, CAPILLARY
Glucose-Capillary: 129 mg/dL — ABNORMAL HIGH (ref 70–99)
Glucose-Capillary: 174 mg/dL — ABNORMAL HIGH (ref 70–99)
Glucose-Capillary: 125 mg/dL — ABNORMAL HIGH (ref 70–99)
Glucose-Capillary: 193 mg/dL — ABNORMAL HIGH (ref 70–99)

## 2023-08-15 LAB — MAGNESIUM: Magnesium: 2 mg/dL (ref 1.7–2.4)

## 2023-08-15 NOTE — Progress Notes (Addendum)
 STROKE TEAM PROGRESS NOTE    INTERIM HISTORY/SUBJECTIVE  Presented with transient blurry vision and generalized weakness 3/28 MRI brain demonstrated right precentral gyrus stroke, right posterior parietal cortex and right lateral temporal cortex infarcts.   CT angio shows 65% stenosis right ICA, 60% stenosis left ICA.  Vascular surgery is consulted Vascular surgery consulted, plan for right TCAR Tuesday.  On exam, patient is sitting up in bed with no complaints, no neurological deficits on exam.   OBJECTIVE  CBC    Component Value Date/Time   WBC 4.7 08/15/2023 0629   RBC 4.82 08/15/2023 0629   HGB 15.0 08/15/2023 0629   HGB 14.5 11/28/2019 1049   HCT 43.1 08/15/2023 0629   HCT 42.3 11/28/2019 1049   PLT 141 (L) 08/15/2023 0629   PLT 163 11/28/2019 1049   MCV 89.4 08/15/2023 0629   MCV 91 11/28/2019 1049   MCV 92 06/12/2012 0540   MCH 31.1 08/15/2023 0629   MCHC 34.8 08/15/2023 0629   RDW 14.0 08/15/2023 0629   RDW 13.7 11/28/2019 1049   RDW 13.3 06/12/2012 0540   LYMPHSABS 1.1 10/27/2022 0906   MONOABS 0.5 10/27/2022 0906   EOSABS 0.1 10/27/2022 0906   BASOSABS 0.0 10/27/2022 0906    BMET    Component Value Date/Time   NA 140 08/15/2023 0629   NA 138 11/28/2019 1049   NA 139 06/12/2012 0540   K 3.8 08/15/2023 0629   K 3.8 06/12/2012 0540   CL 105 08/15/2023 0629   CL 108 (H) 06/12/2012 0540   CO2 23 08/15/2023 0629   CO2 21 06/12/2012 0540   GLUCOSE 131 (H) 08/15/2023 0629   GLUCOSE 128 (H) 06/12/2012 0540   BUN 9 08/15/2023 0629   BUN 10 11/28/2019 1049   BUN 12 06/12/2012 0540   CREATININE 0.81 08/15/2023 0629   CREATININE 0.91 09/27/2015 1414   CALCIUM 9.2 08/15/2023 0629   CALCIUM 8.4 (L) 06/12/2012 0540   GFRNONAA >60 08/15/2023 0629   GFRNONAA >60 06/12/2012 0540    IMAGING past 24 hours No results found.   Vitals:   08/14/23 2100 08/15/23 0000 08/15/23 0335 08/15/23 1146  BP: (!) 155/89 116/69 138/70 (!) 155/85  Pulse:    60  Resp: 18  18 18    Temp: 97.6 F (36.4 C) 98.4 F (36.9 C) 98.2 F (36.8 C) 98.3 F (36.8 C)  TempSrc: Axillary Axillary Axillary Oral  SpO2: 96% 97% 96% 98%  Weight:      Height:        PHYSICAL EXAM General:  Alert, well-nourished, well-developed patient in no acute distress Psych: Calm and cooperative CV: S1-S2 Respiratory:  Regular, unlabored respirations on room air GI: Abdomen soft and nontender   NEURO:  Mental Status: Awake and alert, oriented to self, place, age, month, year.  Able to give clear history. Speech/Language: speech is without dysarthria or aphasia.  Naming, repetition, fluency, and comprehension intact.  Cranial Nerves:  II: PERRL. Visual fields full.  III, IV, VI: EOMI. tracks examiner fully. V: Sensation is intact to light touch and symmetrical to face.  VII: Face is symmetrical resting and smiling VIII: hearing intact to voice. IX, X: Phonation is normal.  ZO:[id number] shrug 5/5. XII: tongue is midline without fasciculations. Motor: 5/5 strength to all muscle groups tested.  Tone: is normal and bulk is normal Sensation- Intact to light touch bilaterally. Extinction absent to light touch to DSS.   Coordination: FTN intact bilaterally, HKS: no ataxia in BLE.No drift.  Gait-  deferred  Most Recent NIH: 0   ASSESSMENT/PLAN  Mr. OLUWANIFEMI SUSMAN is a 78 y.o. male with hx of CAD, HTN,HLD, DM2, prior TIAs, prior history of alcohol use who presents with difficulty using his right hand. MRI: right precentral gyrus stroke along with right posterior parietal cortex and right lateral temporal cortex infarct.CT angio demonstrated bulky plaque in the carotids bilaterally with about 65% stenosis in the right ICA and about 60% stenosis in left ICA. Admitted for Stroke Work-up.  NIH on Admission: 0.  Stroke: Right temporal lobe and CR/SO several small infarcts, etiology likely symptomatic right ICA stenosis, large vessel disease CT head: No acute abnormality.  Remote lacunar  infarct left thalamus.    CTA head & neck: No LVO Approximately 65% stenosis at the origin of the right cervical ICA and 60% stenosis at the origin of the left cervical ICA MRI  10 mm focus of acute infarct in the right centrum semiovale. Additional punctate foci of acute infarct involving the right precentral gyrus near the hand knob as well as the posterior right parietal cortex and lateral right temporal cortex. 2D Echo: LVEF 60-65%, Mildly dilated LA LDL 42 HgbA1c 6.0 VTE prophylaxis - SCDs No antithrombotic prior to admission, continue aspirin 81 mg daily and clopidogrel 75 mg daily. Final regimen per Vascular Surgery. Therapy recommendations:  No follow up needed  Disposition: Pending  Hx of Stroke/TIA ED Visit 2021, noted in chart as RUE weakness, patient states it was transient LUE weakness. All imaging negative for acute abnormality. MRI showed chronic small vessel disease with small chronic infarcts. CTA showed 60% proximal left ICA stenosis  Carotid stenosis CTA head & neck: No LVO Approximately 65% stenosis at the origin of the right cervical ICA and 60% stenosis at the origin of the left cervical ICA Vascular surgery has been consulted  Right TCAR planned for Tuesday  Hypertension Home meds:  Toprol-XL 25mg  daily, amlodipine 5mg  Stable Avoid hypotension given carotid stenosis Long-term BP goal normotensive  Hyperlipidemia Home meds: Crestor 40 mg, Zetia 10 mg, resumed in hospital LDL 42, goal < 70 Continue zetia and statin at discharge  Diabetes type II Controlled Home meds:  none HgbA1c 6.0, goal < 7.0 CBGs SSI Recommend close follow-up with PCP   Other Stroke Risk Factors Advanced age Family hx stroke (mother) Coronary artery disease, and NSTEMI 2018 Alcohol abuse, 3-4 beer (12 ounce) daily, recommend no more than 2 drinks per day, currently on B1/FA/multivitamin  Hospital day # 2   Pt seen by Neuro NP/APP and later by MD. Note/plan to be edited by MD as  needed.    Lynnae January, DNP, AGACNP-BC [facility] Please use AMION for contact information & EPIC for messaging.  ATTENDING NOTE: I reviewed above note and agree with the assessment and plan. Pt was seen and examined.   No family at bedside.  Patient is sitting at the edge of bed, neuro intact, no focal deficit.  Vascular surgery on board, plan for right TCAR Tuesday.  Continue DAPT and statin.  Further antiplatelet regimen per VVS.  For detailed assessment and plan, please refer to above/below as I have made changes wherever appropriate.   Neurology will sign off. Please call with questions. Pt will follow up with stroke clinic NP at [facility] in about 4 weeks. Thanks for the consult.   Marvel Plan, MD PhD Stroke Neurology 08/15/2023 2:09 PM

## 2023-08-15 NOTE — Progress Notes (Signed)
 PROGRESS NOTE  Kevin Webb ZOX:[id number] DOB: 06/18/1945   PCP: Dana Allan, MD  Patient is from: Home.  Independently ambulates at baseline.  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  Had difficulty picking up the razor with his right hand on 3/27 that lasted few minutes and resolved.  Eventually able to grasp the razor and shave.  And had some blurry vision and incoordination with mobility at work that has resolved after he took a nap.  He has not had further symptoms but concerned after he read about stroke and decided to come to ED.  Patient is on Plavix and aspirin with good compliance.  Drinks 3-4 beers and 4 ounces of whiskey a day.  Does not smoke.  In ED, bradycardic to 40s and 50s.  BMP and CBC without significant finding.  Serial troponin negative.  EKG showed sinus bradycardia without acute ischemic changes.  CXR and CT head without acute finding.  MRI brain showed a 10 mm focus of acute infarct in the right centrum semiovale, additional punctate foci of acute infarct involving the right precentral gyrus near the hand knob as well as posterior right parietal cortex and lateral right temporal cortex.  CT angio head and neck showed 65% right cervical ICA stenosis and 60% left cervical ICA stenosis.  Neurology consulted and recommended complete CVA workup and vascular surgery consult.  Subjective: Patient was seen and examined at bedside during morning rounds.  Sitting comfortably on the bed, denied any complaints, no weakness.  Denied any headache or dizziness, no chest pain or palpitation, no shortness of breath.    Objective: Vitals:   08/14/23 2100 08/15/23 0000 08/15/23 0335 08/15/23 1146  BP: (!)  155/89 116/69 138/70 (!) 155/85  Pulse:    60  Resp: 18 18 18    Temp: 97.6 F (36.4 C) 98.4 F (36.9 C) 98.2 F (36.8 C) 98.3 F (36.8 C)  TempSrc: Axillary Axillary Axillary Oral  SpO2: 96% 97% 96% 98%  Weight:      Height:        Examination:  GENERAL: No apparent distress.  Nontoxic. HEENT: MMM.  Vision and hearing grossly intact.  NECK: Supple.  No apparent JVD.  RESP:  No IWOB.  Fair aeration bilaterally. CVS:  RRR. Heart sounds normal.  ABD/GI/GU: BS+. Abd soft, NTND.  MSK/EXT:  Moves extremities. No apparent deformity. No edema.  SKIN: no apparent skin lesion or wound NEURO: CN grossly intact, no focal deficits. PSYCH: Calm. Normal affect.   Consultants:  Neurology Vascular surgery  Procedures: None  Microbiology summarized: None  Assessment and plan: Acute CVA: Patient had brief difficulty using his right hand followed by blurry vision and gait disturbance on on 3/27.  MRI finding does not explain right hand issue or blurry vision.  CT angio head and neck with 65% right cervical ICA stenosis and 60% left cervical ICA stenosis.  He admits to occasional lightheadedness when he stands up raising concern for presyncope.  Reports good compliance with Plavix and aspirin.  He is also on high intensity statin.  LDL is only 42.  A1c 6.0%.  No history of A-fib.  He is currently asymptomatic with no focal neurodeficit  on exam.  -Neurology on board -Continue Plavix, aspirin and statin -Defer BP goals to neurology given ICA stenosis -Vascular surgery consulted -PT/OT/SLP  Bilateral cervical ICA stenosis -Plavix, aspirin and statin as above -Neurology and vascular surgery on board Vascular neurology planning for right TCAR on Tuesday 4/1  CAD status post CABG in 2000 and graft stent in 2018.  LHC in 2021 with severe three-vessel occlusive CAD.  He is on Imdur, Plavix, aspirin, Toprol-XL, ramipril, Crestor and Zetia -Continue Plavix, aspirin, Crestor and Zetia -Other meds  on hold to avoid hypotension   Sinus bradycardia: Likely due to Toprol-XL.  EKG sinus bradycardia to 56 with chronic LAD and RBBB -Continue holding Toprol-XL -Continue telemetry -TSH 2.88 wnl  Chronic chest pain: Likely musculoskeletal.  Usually when he wake In the Morning.  Serial Troponin negative.  No acute ischemic finding on EKG.  CXR without acute finding. -Tylenol as needed  Alcohol use disorder: Reports drinking 3-4 beers and about 4 ounces of whiskey a day -Encouraged cessation/moderation.  He is determined to quit. -Continue CIWA with as needed Ativan -Continue thiamine, folic acid and multivitamin  Essential hypertension:  -Continue holding home meds -Defer BP course to neurology given his bilateral ICA stenosis  Prediabetes: A1c 6.0%.  Not on meds at home. Recent Labs  Lab 08/14/23 1156 08/14/23 1641 08/14/23 2119 08/15/23 0656 08/15/23 1147  GLUCAP 185* 149* 161* 129* 174*  -Continue SSI-sensitive  Hyperlipidemia: LDL 42 -Continue Crestor and Zetia  BPH without LUTS -Continue Flomax  GERD -Continue PPI  Body mass index is 29.14 kg/m.   DVT prophylaxis:  SCD's Start: 08/14/23 0103  Code Status: Full code Family Communication: None at bedside Level of care: Telemetry Medical Status is: Inpatient Remains inpatient appropriate because: Acute stroke   Final disposition: Likely home once medically stable  Time spent: 40 minutes with more than 50% spent in reviewing records, counseling patient/family and coordinating care.   Sch Meds:  Scheduled Meds:  aspirin EC  81 mg Oral Q0200   clopidogrel  75 mg Oral Daily   dutasteride  0.5 mg Oral Daily   ezetimibe  10 mg Oral Daily   folic acid  1 mg Oral Daily   insulin aspart  0-5 Units Subcutaneous QHS   insulin aspart  0-9 Units Subcutaneous TID WC   multivitamin with minerals  1 tablet Oral Daily   pantoprazole  40 mg Oral Daily   rosuvastatin  40 mg Oral QHS   tamsulosin  0.4 mg Oral Q0200    thiamine  100 mg Oral Daily   Or   thiamine  100 mg Intravenous Daily   Continuous Infusions: PRN Meds:.LORazepam **OR** LORazepam  Antimicrobials: Anti-infectives (From admission, onward)    None        I have personally reviewed the following labs and images: CBC: Recent Labs  Lab 08/13/23 1306 08/15/23 0629  WBC 6.3 4.7  HGB 14.0 15.0  HCT 41.9 43.1  MCV 91.1 89.4  PLT 151 141*   BMP &GFR Recent Labs  Lab 08/13/23 1306 08/15/23 0629  NA 137 140  K 4.0 3.8  CL 103 105  CO2 27 23  GLUCOSE 169* 131*  BUN 10 9  CREATININE 0.86 0.81  CALCIUM 8.8* 9.2  MG  --  2.0  PHOS  --  4.4   Estimated Creatinine Clearance: 87.2 mL/min (by C-G formula based on SCr of 0.81 mg/dL). Liver & Pancreas: Recent Labs  Lab 08/15/23 0629  ALBUMIN 3.5   No  results for input(s): "LIPASE", "AMYLASE" in the last 168 hours. No results for input(s): "AMMONIA" in the last 168 hours. Diabetic: Recent Labs    08/14/23 0552  HGBA1C 6.0*   Recent Labs  Lab 08/14/23 1156 08/14/23 1641 08/14/23 2119 08/15/23 0656 08/15/23 1147  GLUCAP 185* 149* 161* 129* 174*   Cardiac Enzymes: No results for input(s): "CKTOTAL", "CKMB", "CKMBINDEX", "TROPONINI" in the last 168 hours. No results for input(s): "PROBNP" in the last 8760 hours. Coagulation Profile: No results for input(s): "INR", "PROTIME" in the last 168 hours. Thyroid Function Tests: Recent Labs    08/14/23 0552  TSH 2.880   Lipid Profile: Recent Labs    08/14/23 0552  CHOL 113  HDL 45  LDLCALC 42  TRIG 130  CHOLHDL 2.5   Anemia Panel: No results for input(s): "VITAMINB12", "FOLATE", "FERRITIN", "TIBC", "IRON", "RETICCTPCT" in the last 72 hours. Urine analysis:    Component Value Date/Time   COLORURINE YELLOW 05/05/2023 1201   APPEARANCEUR Clear 05/27/2023 1459   LABSPEC 1.010 05/05/2023 1201   PHURINE 7.5 05/05/2023 1201   GLUCOSEU Negative 05/27/2023 1459   GLUCOSEU NEGATIVE 05/05/2023 1201   HGBUR  NEGATIVE 05/05/2023 1201   BILIRUBINUR Negative 05/27/2023 1459   KETONESUR NEGATIVE 05/05/2023 1201   PROTEINUR Negative 05/27/2023 1459   PROTEINUR NEGATIVE 11/11/2021 1004   UROBILINOGEN 0.2 05/05/2023 1201   NITRITE Negative 05/27/2023 1459   NITRITE NEGATIVE 05/05/2023 1201   LEUKOCYTESUR Negative 05/27/2023 1459   LEUKOCYTESUR NEGATIVE 05/05/2023 1201   Sepsis Labs: Invalid input(s): "PROCALCITONIN", "LACTICIDVEN"  Microbiology: No results found for this or any previous visit (from the past 240 hours).  Radiology Studies: No results found.     Taye T. Gonfa [name]  If 7PM-7AM, please contact night-coverage [email or web address] 08/15/2023, 12:52 PM

## 2023-08-15 NOTE — Progress Notes (Signed)
  Daily Progress Note   Subjective: Feeling well, no complaints, ready for surgery on Tuesday  Objective: Vitals:   08/15/23 0335 08/15/23 1146  BP: 138/70 (!) 155/85  Pulse:  60  Resp: 18   Temp: 98.2 F (36.8 C) 98.3 F (36.8 C)  SpO2: 96% 98%    Physical Examination NAD HDS Nonfocal  ASSESSMENT/PLAN:  This is a 78 y.o. male with symptomatic right ICA stenosis with strokes.  He does not have any residual deficits.  I explained to the threshold for treatment for asymptomatic and symptomatic patients with carotid artery stenosis and  I explained that he is considered to be symptomatic even though he currently does not have any deficits given that he has imaging with acute stroke.   I explained the 3 modalities of treatment with best medical therapy, carotid endarterectomy and carotid stenting. We discussed the risks and benefits of carotid intrevention.  I informed that there is a small risk of stroke during the procedure which is about 1 to 2% but with medical therapy alone there is a 26% risk of stroke over the next 2 years.  I explained that with intervention, that risk is brought down to about 10%.  We also discussed the risk of cranial nerve injury, bleeding, infection and the small risk of restenosis in the future. I offered TCAR given his age and significant coronary history.  Consent ordered   [name] MD MS [facility] [phone] 08/15/2023  2:01 PM

## 2023-08-16 ENCOUNTER — Other Ambulatory Visit: Payer: Self-pay

## 2023-08-16 DIAGNOSIS — R072 Precordial pain: Secondary | ICD-10-CM | POA: Diagnosis not present

## 2023-08-16 DIAGNOSIS — I639 Cerebral infarction, unspecified: Secondary | ICD-10-CM | POA: Diagnosis not present

## 2023-08-16 DIAGNOSIS — K219 Gastro-esophageal reflux disease without esophagitis: Secondary | ICD-10-CM | POA: Diagnosis not present

## 2023-08-16 DIAGNOSIS — E1165 Type 2 diabetes mellitus with hyperglycemia: Secondary | ICD-10-CM | POA: Diagnosis not present

## 2023-08-16 LAB — BASIC METABOLIC PANEL WITH GFR
Anion gap: 10 (ref 5–15)
BUN: 9 mg/dL (ref 8–23)
CO2: 23 mmol/L (ref 22–32)
Calcium: 9.2 mg/dL (ref 8.9–10.3)
Chloride: 106 mmol/L (ref 98–111)
Creatinine, Ser: 0.9 mg/dL (ref 0.61–1.24)
GFR, Estimated: >60 mL/min (ref >60)
Glucose, Bld: 124 mg/dL — ABNORMAL HIGH (ref 70–99)
Potassium: 3.9 mmol/L (ref 3.5–5.1)
Sodium: 139 mmol/L (ref 135–145)

## 2023-08-16 LAB — CBC
HCT: 43.5 % (ref 39.0–52.0)
Hemoglobin: 14.8 g/dL (ref 13.0–17.0)
MCH: 30.2 pg (ref 26.0–34.0)
MCHC: 34 g/dL (ref 30.0–36.0)
MCV: 88.8 fL (ref 80.0–100.0)
Platelets: 147 10*3/uL — ABNORMAL LOW (ref 150–400)
RBC: 4.9 MIL/uL (ref 4.22–5.81)
RDW: 13.9 % (ref 11.5–15.5)
WBC: 5.6 10*3/uL (ref 4.0–10.5)
nRBC: 0 % (ref 0.0–0.2)

## 2023-08-16 LAB — GLUCOSE, CAPILLARY
Glucose-Capillary: 127 mg/dL — ABNORMAL HIGH (ref 70–99)
Glucose-Capillary: 190 mg/dL — ABNORMAL HIGH (ref 70–99)
Glucose-Capillary: 103 mg/dL — ABNORMAL HIGH (ref 70–99)
Glucose-Capillary: 143 mg/dL — ABNORMAL HIGH (ref 70–99)

## 2023-08-16 LAB — TYPE AND SCREEN
ABO/RH(D): O POS
Antibody Screen: NEGATIVE

## 2023-08-16 LAB — PHOSPHORUS: Phosphorus: 4.1 mg/dL (ref 2.5–4.6)

## 2023-08-16 LAB — MAGNESIUM: Magnesium: 2 mg/dL (ref 1.7–2.4)

## 2023-08-16 MED ORDER — CHLORHEXIDINE GLUCONATE CLOTH 2 % EX PADS
6.0000 | MEDICATED_PAD | Freq: Every day | CUTANEOUS | Status: DC
Start: 2023-08-17 — End: 2023-08-16

## 2023-08-16 MED ORDER — CHLORHEXIDINE GLUCONATE CLOTH 2 % EX PADS
6.0000 | MEDICATED_PAD | Freq: Once | CUTANEOUS | Status: AC
  Administered 2023-08-17: 6 via TOPICAL

## 2023-08-16 MED ORDER — INSULIN ASPART 100 UNIT/ML IJ SOLN
3.0000 [IU] | Freq: Three times a day (TID) | INTRAMUSCULAR | Status: DC
  Administered 2023-08-16 – 2023-08-18 (×3): 3 [IU] via SUBCUTANEOUS

## 2023-08-16 MED ORDER — ISOSORBIDE MONONITRATE ER 30 MG PO TB24
30.0000 mg | ORAL_TABLET | Freq: Every day | ORAL | Status: DC
  Administered 2023-08-16: 30 mg via ORAL
  Filled 2023-08-16: qty 1

## 2023-08-16 NOTE — TOC Initial Note (Signed)
 Transition of Care [facility]) - Initial/Assessment Note    Patient Details  Name: Kevin Webb MRN: [id number] Date of Birth: 1945/12/01  Transition of Care [facility]) CM/SW Contact:    Kermit Balo, RN Phone Number: 08/16/2023, 11:16 AM  Clinical Narrative:                  Pt is from home with his spouse. She is with him most of the time.  Pt has DME at home but not currently using any. He manages his medications and denies any issues.  Spouse is going to provide needed transportation.  Plan is for TCAR in am.  Currently no follow up per therapies.  TOC following.  Expected Discharge Plan: Home/Self Care Barriers to Discharge: Continued Medical Work up   Patient Goals and CMS Choice            Expected Discharge Plan and Services       Living arrangements for the past 2 months: Single Family Home                                      Prior Living Arrangements/Services Living arrangements for the past 2 months: Single Family Home Lives with:: Spouse Patient language and need for interpreter reviewed:: Yes Do you feel safe going back to the place where you live?: Yes        Care giver support system in place?: Yes (comment) Current home services: DME (cane/ walker/ shower seat) Criminal Activity/Legal Involvement Pertinent to Current Situation/Hospitalization: No - Comment as needed  Activities of Daily Living      Permission Sought/Granted                  Emotional Assessment Appearance:: Appears stated age Attitude/Demeanor/Rapport: Engaged Affect (typically observed): Accepting Orientation: : Oriented to Self, Oriented to Place, Oriented to  Time, Oriented to Situation   Psych Involvement: No (comment)  Admission diagnosis:  Acute ischemic stroke [facility]) [I63.9] Cerebrovascular accident (CVA), unspecified mechanism (HCC) [I63.9] Patient Active Problem List   Diagnosis Date Noted   Acute ischemic stroke (HCC) 08/13/2023   Type 2 diabetes  mellitus (HCC) 05/12/2023   Pain in left testicle 05/06/2023   CAD (coronary artery disease) 12/30/2022   Diabetic eye exam (HCC) 11/08/2022   EtOH dependence (HCC) 05/04/2022   Hx of CABG 12/25/2020   Hypertension associated with diabetes (HCC) 03/26/2020   Left carotid artery stenosis 02/02/2020   Obesity (BMI 30-39.9) 09/22/2019   Aortic atherosclerosis (HCC) 06/20/2019   Diverticulosis 06/20/2019   Cervicalgia 03/29/2018   Vitamin D deficiency    Sinus bradycardia    Peyronie disease    Osteoarthritis of right knee    Hyperlipidemia    Gastroesophageal reflux disease    Diverticulitis    Coronary artery disease of native artery of native heart with stable angina pectoris (HCC)    BPH associated with nocturia    Hyperlipidemia LDL goal <70 01/08/2017   Chest pain 04/18/2013   PCP:  Dana Allan, MD Pharmacy:   [facility] - [place], [place] - [address] [place] [address] Phone: [phone] Fax: [phone]  [id number] - [place], [place] - [address] AT [facility] [address] [place] [address] Phone: [phone] Fax: [phone]     Social Drivers of Health (SDOH) Social History: SDOH Screenings   Food Insecurity:  No Food Insecurity (08/14/2023)  Housing: Low Risk  (08/14/2023)  Transportation Needs: No Transportation Needs (08/14/2023)  Utilities: Not At Risk (08/14/2023)  Alcohol Screen: Low Risk  (12/16/2022)  Depression (PHQ2-9): Low Risk  (05/06/2023)  Financial Resource Strain: Low Risk  (12/16/2022)  Physical Activity: Inactive (12/16/2022)  Social Connections: Socially Integrated (08/14/2023)  Stress: No Stress Concern Present (12/16/2022)  Tobacco Use: Low Risk  (08/13/2023)  Health Literacy: Adequate Health Literacy (12/16/2022)   SDOH Interventions:     Readmission Risk Interventions     No data to display

## 2023-08-16 NOTE — Plan of Care (Signed)
  Problem: Education: Goal: Knowledge of General Education information will improve Description: Including pain rating scale, medication(s)/side effects and non-pharmacologic comfort measures Outcome: Progressing   Problem: Clinical Measurements: Goal: Will remain free from infection Outcome: Progressing Goal: Respiratory complications will improve Outcome: Progressing   Problem: Activity: Goal: Risk for activity intolerance will decrease Outcome: Progressing   Problem: Coping: Goal: Level of anxiety will decrease Outcome: Progressing   Problem: Elimination: Goal: Will not experience complications related to urinary retention Outcome: Progressing   Problem: Pain Managment: Goal: General experience of comfort will improve and/or be controlled Outcome: Progressing

## 2023-08-16 NOTE — Anesthesia Preprocedure Evaluation (Addendum)
 Anesthesia Evaluation  Patient identified by MRN, date of birth, ID band Patient awake    Reviewed: Allergy & Precautions, NPO status , Patient's Chart, lab work & pertinent test results  Airway Mallampati: II  TM Distance: >3 FB Neck ROM: Full    Dental  (+) Dental Advisory Given   Pulmonary    Pulmonary exam normal        Cardiovascular hypertension, Pt. on medications and Pt. on home beta blockers + angina  + CAD, + Past MI, + Cardiac Stents, + CABG and + Peripheral Vascular Disease  Normal cardiovascular exam   '25 TTE - EF 60 to 65%. RV is dilated. Left atrial size was mildly dilated. Mild AI.  '21 Cath  -  Ost LAD to Prox LAD lesion is 100% stenosed.  1st Diag lesion is 60% stenosed.  Ramus lesion is 100% stenosed.  Prox Cx lesion is 40% stenosed.  Prox RCA lesion is 100% stenosed.  Mid RCA lesion is 100% stenosed.  Prox Graft lesion is 100% stenosed.  Prox Graft lesion is 100% stenosed.  Origin to Prox Graft lesion is 100% stenosed.  The left ventricular systolic function is normal.  LV end diastolic pressure is normal.  The left ventricular ejection fraction is 55-65% by visual estimate     Neuro/Psych  PSYCHIATRIC DISORDERS      TIACVA, No Residual Symptoms    GI/Hepatic ,GERD  Medicated and Controlled,,  Endo/Other  diabetes, Type 2    Renal/GU      Musculoskeletal  (+) Arthritis ,    Abdominal   Peds  Hematology  Plt 147k On plavix    Anesthesia Other Findings HSV  Reproductive/Obstetrics                             Anesthesia Physical Anesthesia Plan  ASA: 4  Anesthesia Plan: General   Post-op Pain Management: Tylenol PO (pre-op)*   Induction: Intravenous  PONV Risk Score and Plan: 2 and Treatment may vary due to age or medical condition, Ondansetron and Dexamethasone  Airway Management Planned: Oral ETT  Additional Equipment: Arterial  line  Intra-op Plan:   Post-operative Plan: Extubation in OR  Informed Consent: I have reviewed the patients History and Physical, chart, labs and discussed the procedure including the risks, benefits and alternatives for the proposed anesthesia with the patient or authorized representative who has indicated his/her understanding and acceptance.     Dental advisory given  Plan Discussed with: CRNA and Anesthesiologist  Anesthesia Plan Comments:        Anesthesia Quick Evaluation

## 2023-08-16 NOTE — Progress Notes (Signed)
 PROGRESS NOTE  Kevin Webb QMV:[id number] DOB: 06/09/1945   PCP: Dana Allan, MD  Patient is from: Home.  Independently ambulates at baseline.  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  Had difficulty picking up the razor with his right hand on 3/27 that lasted few minutes and resolved.  Eventually able to grasp the razor and shave.  And had some blurry vision and incoordination with mobility at work that has resolved after he took a nap.  He has not had further symptoms but concerned after he read about stroke and decided to come to ED.  Patient is on Plavix and aspirin with good compliance.  Drinks 3-4 beers and 4 ounces of whiskey a day.  Does not smoke.  In ED, bradycardic to 40s and 50s.  BMP and CBC without significant finding.  Serial troponin negative.  EKG showed sinus bradycardia without acute ischemic changes.  CXR and CT head without acute finding.  MRI brain showed a 10 mm focus of acute infarct in the right centrum semiovale, additional punctate foci of acute infarct involving the right precentral gyrus near the hand knob as well as posterior right parietal cortex and lateral right temporal cortex.  CT angio head and neck showed 65% right cervical ICA stenosis and 60% left cervical ICA stenosis.  Neurology consulted and recommended complete CVA workup and vascular surgery consult.  TTE without significant finding.  Remains asymptomatic.  Plan for right TCAR on/1.  Subjective: Seen and examined earlier this morning.  No major events overnight of this morning.  No complaints.   Objective: Vitals:   08/15/23 1605 08/15/23 1934 08/16/23 0048 08/16/23 0425  BP: (!) 165/86 (!) 179/82 (!) 132/91 (!) 145/89   Pulse: (!) 57 60 65 63  Resp: 17 17 18 18   Temp: 98 F (36.7 C) 97.6 F (36.4 C) 98 F (36.7 C) 98.4 F (36.9 C)  TempSrc: Oral Oral  Oral  SpO2: 98% 96% 96% 96%  Weight:      Height:        Examination:  GENERAL: No apparent distress.  Nontoxic. HEENT: MMM.  Vision and hearing grossly intact.  NECK: Supple.  No apparent JVD.  RESP:  No IWOB.  Fair aeration bilaterally. CVS:  RRR. Heart sounds normal.  ABD/GI/GU: BS+. Abd soft, NTND.  MSK/EXT:   No apparent deformity. Moves extremities. No edema.  SKIN: no apparent skin lesion or wound NEURO: Awake and alert. Oriented appropriately.  No apparent focal neuro deficit. PSYCH: Calm. Normal affect.   Consultants:  Neurology Vascular surgery  Procedures: None  Microbiology summarized: None  Assessment and plan: Acute CVA: Patient had brief difficulty using his right hand followed by blurry vision and gait disturbance on on 3/27.  MRI finding does not explain right hand issue or blurry vision.  CT angio head and neck with 65% right cervical ICA stenosis and 60% left cervical ICA stenosis.  He admits to occasional lightheadedness when he stands up raising concern for presyncope.  Reports good compliance with Plavix and aspirin.  TTE without significant finding.  LDL is only 42.  A1c 6.0%.  No history of A-fib.  Remains on symptomatic. -Appreciate input by neurology -Continue  Plavix, aspirin and statin -Avoid hypotension given carotid artery stenosis -Plan for right TCAR on 4/1. -PT/OT/SLP-no need identified.  Bilateral cervical ICA stenosis -Plavix, aspirin and statin as above -Plan for right TCAR on 4/1 -Avoid hypotension  CAD status post CABG in 2000 and graft stent in 2018.  LHC in 2021 with severe three-vessel occlusive CAD.  He is on Imdur, Plavix, aspirin, Toprol-XL, ramipril, Crestor and Zetia -Continue Plavix, aspirin, Crestor and Zetia -Resume home Imdur at reduced dose to avoid hypotension   Sinus bradycardia:  Likely due to Toprol-XL.  EKG sinus bradycardia to 56 with chronic LAD and RBBB.  HR in 60s.  TSH normal. -Continue holding Toprol-XL -Continue telemetry  Chronic chest pain: Likely musculoskeletal.  Usually when he wake In the Morning.  Serial Troponin negative.  No acute ischemic finding on EKG.  CXR without acute finding. -Tylenol as needed -Resume home Imdur at reduced dose  Alcohol use disorder: Reports drinking 3-4 beers and about 4 ounces of whiskey a day.  No withdrawal symptoms. -Encouraged cessation/moderation.  He is determined to quit. -Continue CIWA with as needed Ativan -Continue thiamine, folic acid and multivitamin  Essential hypertension: SBP ranges from 130s to 170s. -Resume home Imdur at reduced dose -Continue holding other meds to avoid hypotension  Prediabetes: A1c 6.0%.  Not on meds at home. Recent Labs  Lab 08/15/23 1147 08/15/23 1603 08/15/23 2119 08/16/23 0606 08/16/23 1154  GLUCAP 174* 125* 193* 127* 190*  -Continue SSI-sensitive -Add NovoLog 3 units 3 times daily with meals  Hyperlipidemia: LDL 42 -Continue Crestor and Zetia  BPH without LUTS -Continue Flomax  GERD -Continue PPI  Body mass index is 29.14 kg/m.           DVT prophylaxis:  SCD's Start: 08/14/23 0103  Code Status: Full code Family Communication: None at bedside Level of care: Telemetry Medical Status is: Inpatient Remains inpatient appropriate because: Acute stroke, bilateral ICA stenosis   Final disposition: Likely home on 4/2   35 minutes with more than 50% spent in reviewing records, counseling patient/family and coordinating care.   Sch Meds:  Scheduled Meds:  aspirin EC  81 mg Oral Q0200   clopidogrel  75 mg Oral Daily   dutasteride  0.5 mg Oral Daily   ezetimibe  10 mg Oral Daily   folic acid  1 mg Oral Daily   insulin aspart  0-5 Units Subcutaneous QHS   insulin aspart  0-9 Units Subcutaneous TID WC   multivitamin with minerals  1 tablet Oral Daily    pantoprazole  40 mg Oral Daily   rosuvastatin  40 mg Oral QHS   tamsulosin  0.4 mg Oral Q0200   thiamine  100 mg Oral Daily   Or   thiamine  100 mg Intravenous Daily   Continuous Infusions: PRN Meds:.LORazepam **OR** LORazepam  Antimicrobials: Anti-infectives (From admission, onward)    None        I have personally reviewed the following labs and images: CBC: Recent Labs  Lab 08/13/23 1306 08/15/23 0629 08/16/23 0646  WBC 6.3 4.7 5.6  HGB 14.0 15.0 14.8  HCT 41.9 43.1 43.5  MCV 91.1 89.4 88.8  PLT 151 141* 147*   BMP &GFR Recent Labs  Lab 08/13/23 1306 08/15/23 0629 08/16/23 0646  NA 137 140 139  K 4.0 3.8 3.9  CL 103 105 106  CO2 27 23 23   GLUCOSE 169* 131* 124*  BUN 10 9 9   CREATININE 0.86 0.81 0.90  CALCIUM 8.8* 9.2  9.2  MG  --  2.0 2.0  PHOS  --  4.4 4.1   Estimated Creatinine Clearance: 78.4 mL/min (by C-G formula based on SCr of 0.9 mg/dL). Liver & Pancreas: Recent Labs  Lab 08/15/23 0629  ALBUMIN 3.5   No results for input(s): "LIPASE", "AMYLASE" in the last 168 hours. No results for input(s): "AMMONIA" in the last 168 hours. Diabetic: Recent Labs    08/14/23 0552  HGBA1C 6.0*   Recent Labs  Lab 08/15/23 1147 08/15/23 1603 08/15/23 2119 08/16/23 0606 08/16/23 1154  GLUCAP 174* 125* 193* 127* 190*   Cardiac Enzymes: No results for input(s): "CKTOTAL", "CKMB", "CKMBINDEX", "TROPONINI" in the last 168 hours. No results for input(s): "PROBNP" in the last 8760 hours. Coagulation Profile: No results for input(s): "INR", "PROTIME" in the last 168 hours. Thyroid Function Tests: Recent Labs    08/14/23 0552  TSH 2.880   Lipid Profile: Recent Labs    08/14/23 0552  CHOL 113  HDL 45  LDLCALC 42  TRIG 130  CHOLHDL 2.5   Anemia Panel: No results for input(s): "VITAMINB12", "FOLATE", "FERRITIN", "TIBC", "IRON", "RETICCTPCT" in the last 72 hours. Urine analysis:    Component Value Date/Time   COLORURINE YELLOW 05/05/2023  1201   APPEARANCEUR Clear 05/27/2023 1459   LABSPEC 1.010 05/05/2023 1201   PHURINE 7.5 05/05/2023 1201   GLUCOSEU Negative 05/27/2023 1459   GLUCOSEU NEGATIVE 05/05/2023 1201   HGBUR NEGATIVE 05/05/2023 1201   BILIRUBINUR Negative 05/27/2023 1459   KETONESUR NEGATIVE 05/05/2023 1201   PROTEINUR Negative 05/27/2023 1459   PROTEINUR NEGATIVE 11/11/2021 1004   UROBILINOGEN 0.2 05/05/2023 1201   NITRITE Negative 05/27/2023 1459   NITRITE NEGATIVE 05/05/2023 1201   LEUKOCYTESUR Negative 05/27/2023 1459   LEUKOCYTESUR NEGATIVE 05/05/2023 1201   Sepsis Labs: Invalid input(s): "PROCALCITONIN", "LACTICIDVEN"  Microbiology: No results found for this or any previous visit (from the past 240 hours).  Radiology Studies: No results found.     Nishtha Raider T. Zondra Lawlor [name]  If 7PM-7AM, please contact night-coverage [email or web address] 08/16/2023, 2:39 PM

## 2023-08-16 NOTE — Progress Notes (Signed)
 Order set opened in error, as pt is inpatient.

## 2023-08-16 NOTE — Care Management Important Message (Signed)
 Important Message  Patient Details  Name: Kevin Webb MRN: [id number] Date of Birth: 1945/07/23   Important Message Given:  Yes - Medicare IM     Dorena Bodo 08/16/2023, 3:51 PM

## 2023-08-16 NOTE — TOC CAGE-AID Note (Signed)
 Transition of Care [facility]) - CAGE-AID Screening   Patient Details  Name: Kevin Webb MRN: [id number] Date of Birth: [birth year]  [facility]) CM/SW Contact:    Kermit Balo, RN Phone Number: 08/16/2023, 11:14 AM   Clinical Narrative:  Pt denied the need for inpatient/ outpatient alcohol counseling. He states he can quit. States he drinks with friends.    CAGE-AID Screening:    Have You Ever Felt You Ought to Cut Down on Your Drinking or Drug Use?: Yes Have People Annoyed You By Critizing Your Drinking Or Drug Use?: No Have You Felt Bad Or Guilty About Your Drinking Or Drug Use?: No Have You Ever Had a Drink or Used Drugs First Thing In The Morning to Steady Your Nerves or to Get Rid of a Hangover?: No CAGE-AID Score: 1  Substance Abuse Education Offered: Yes (refused)

## 2023-08-16 NOTE — Plan of Care (Signed)
  Problem: Education: Goal: Knowledge of General Education information will improve Description: Including pain rating scale, medication(s)/side effects and non-pharmacologic comfort measures Outcome: Progressing   Problem: Health Behavior/Discharge Planning: Goal: Ability to manage health-related needs will improve Outcome: Progressing   Problem: Activity: Goal: Risk for activity intolerance will decrease Outcome: Progressing   Problem: Nutrition: Goal: Adequate nutrition will be maintained Outcome: Progressing   Problem: Pain Managment: Goal: General experience of comfort will improve and/or be controlled Outcome: Progressing   Problem: Safety: Goal: Ability to remain free from injury will improve Outcome: Progressing   Problem: Skin Integrity: Goal: Risk for impaired skin integrity will decrease Outcome: Progressing

## 2023-08-16 NOTE — Progress Notes (Signed)
  Progress Note    08/16/2023 7:51 AM * No surgery found *  Subjective:  no complaints, says he feels great    Vitals:   08/16/23 0048 08/16/23 0425  BP: (!) 132/91 (!) 145/89  Pulse: 65 63  Resp: 18 18  Temp: 98 F (36.7 C) 98.4 F (36.9 C)  SpO2: 96% 96%    Physical Exam: General:  resting comfortably Lungs:  nonlabored Extremities:  moving all extremities equally Neuro: awake and alert  CBC    Component Value Date/Time   WBC 5.6 08/16/2023 0646   RBC 4.90 08/16/2023 0646   HGB 14.8 08/16/2023 0646   HGB 14.5 11/28/2019 1049   HCT 43.5 08/16/2023 0646   HCT 42.3 11/28/2019 1049   PLT 147 (L) 08/16/2023 0646   PLT 163 11/28/2019 1049   MCV 88.8 08/16/2023 0646   MCV 91 11/28/2019 1049   MCV 92 06/12/2012 0540   MCH 30.2 08/16/2023 0646   MCHC 34.0 08/16/2023 0646   RDW 13.9 08/16/2023 0646   RDW 13.7 11/28/2019 1049   RDW 13.3 06/12/2012 0540   LYMPHSABS 1.1 10/27/2022 0906   MONOABS 0.5 10/27/2022 0906   EOSABS 0.1 10/27/2022 0906   BASOSABS 0.0 10/27/2022 0906    BMET    Component Value Date/Time   NA 139 08/16/2023 0646   NA 138 11/28/2019 1049   NA 139 06/12/2012 0540   K 3.9 08/16/2023 0646   K 3.8 06/12/2012 0540   CL 106 08/16/2023 0646   CL 108 (H) 06/12/2012 0540   CO2 23 08/16/2023 0646   CO2 21 06/12/2012 0540   GLUCOSE 124 (H) 08/16/2023 0646   GLUCOSE 128 (H) 06/12/2012 0540   BUN 9 08/16/2023 0646   BUN 10 11/28/2019 1049   BUN 12 06/12/2012 0540   CREATININE 0.90 08/16/2023 0646   CREATININE 0.91 09/27/2015 1414   CALCIUM 9.2 08/16/2023 0646   CALCIUM 8.4 (L) 06/12/2012 0540   GFRNONAA >60 08/16/2023 0646   GFRNONAA >60 06/12/2012 0540   GFRAA >60 01/20/2020 0413   GFRAA >60 06/12/2012 0540    INR    Component Value Date/Time   INR 1.1 01/19/2020 1455   INR 1.0 06/12/2012 0540    No intake or output data in the 24 hours ending 08/16/23 0751    Assessment/Plan:  78 y.o. male with symptomatic right ICA  stenosis   -Patient is feeling great this morning without any issues -Denies any new neurological symptoms such as slurred speech, facial droop, weakness/numbness -Plan is for right TCAR tomorrow with Dr.Buckley. Patient is agreeable to surgery. All questions answered -Will make NPO at midnight and place consent orders -Continue asa and plavix   Loel Dubonnet, PA-C [facility] [phone] 08/16/2023 7:51 AM

## 2023-08-16 NOTE — Plan of Care (Signed)
  Problem: Education: Goal: Knowledge of General Education information will improve Description: Including pain rating scale, medication(s)/side effects and non-pharmacologic comfort measures Outcome: Progressing   Problem: Health Behavior/Discharge Planning: Goal: Ability to manage health-related needs will improve Outcome: Progressing   Problem: Activity: Goal: Risk for activity intolerance will decrease Outcome: Progressing   Problem: Nutrition: Goal: Adequate nutrition will be maintained Outcome: Progressing   Problem: Pain Managment: Goal: General experience of comfort will improve and/or be controlled Outcome: Progressing   Problem: Safety: Goal: Ability to remain free from injury will improve Outcome: Progressing   Problem: Skin Integrity: Goal: Risk for impaired skin integrity will decrease Outcome: Progressing   Problem: Education: Goal: Knowledge of disease or condition will improve Outcome: Progressing Goal: Knowledge of secondary prevention will improve (MUST DOCUMENT ALL) Outcome: Progressing Goal: Knowledge of patient specific risk factors will improve (DELETE if not current risk factor) Outcome: Progressing   Problem: Ischemic Stroke/TIA Tissue Perfusion: Goal: Complications of ischemic stroke/TIA will be minimized Outcome: Progressing

## 2023-08-17 ENCOUNTER — Inpatient Hospital Stay (HOSPITAL_COMMUNITY): Payer: Self-pay | Admitting: Anesthesiology

## 2023-08-17 ENCOUNTER — Encounter (HOSPITAL_COMMUNITY)
Admission: EM | Disposition: A | Discharge status: Home / Self Care | Payer: Self-pay | Source: Home / Self Care | Attending: Student

## 2023-08-17 ENCOUNTER — Other Ambulatory Visit: Payer: Self-pay

## 2023-08-17 ENCOUNTER — Encounter (HOSPITAL_COMMUNITY): Payer: Self-pay | Admitting: Internal Medicine

## 2023-08-17 ENCOUNTER — Inpatient Hospital Stay (HOSPITAL_COMMUNITY)

## 2023-08-17 DIAGNOSIS — I6521 Occlusion and stenosis of right carotid artery: Secondary | ICD-10-CM

## 2023-08-17 DIAGNOSIS — I10 Essential (primary) hypertension: Secondary | ICD-10-CM | POA: Diagnosis not present

## 2023-08-17 DIAGNOSIS — K219 Gastro-esophageal reflux disease without esophagitis: Secondary | ICD-10-CM | POA: Diagnosis not present

## 2023-08-17 DIAGNOSIS — E785 Hyperlipidemia, unspecified: Secondary | ICD-10-CM

## 2023-08-17 DIAGNOSIS — I25119 Atherosclerotic heart disease of native coronary artery with unspecified angina pectoris: Secondary | ICD-10-CM

## 2023-08-17 DIAGNOSIS — E1165 Type 2 diabetes mellitus with hyperglycemia: Secondary | ICD-10-CM | POA: Diagnosis not present

## 2023-08-17 DIAGNOSIS — I639 Cerebral infarction, unspecified: Secondary | ICD-10-CM | POA: Diagnosis not present

## 2023-08-17 DIAGNOSIS — R072 Precordial pain: Secondary | ICD-10-CM | POA: Diagnosis not present

## 2023-08-17 HISTORY — PX: TRANSCAROTID ARTERY REVASCULARIZATIONÂ: SHX6778

## 2023-08-17 HISTORY — PX: ULTRASOUND GUIDANCE FOR VASCULAR ACCESS: SHX6516

## 2023-08-17 LAB — GLUCOSE, CAPILLARY
Glucose-Capillary: 128 mg/dL — ABNORMAL HIGH (ref 70–99)
Glucose-Capillary: 126 mg/dL — ABNORMAL HIGH (ref 70–99)
Glucose-Capillary: 159 mg/dL — ABNORMAL HIGH (ref 70–99)
Glucose-Capillary: 212 mg/dL — ABNORMAL HIGH (ref 70–99)

## 2023-08-17 LAB — CBC
HCT: 42.1 % (ref 39.0–52.0)
Hemoglobin: 14.8 g/dL (ref 13.0–17.0)
MCH: 31.6 pg (ref 26.0–34.0)
MCHC: 35.2 g/dL (ref 30.0–36.0)
MCV: 89.8 fL (ref 80.0–100.0)
Platelets: 136 10*3/uL — ABNORMAL LOW (ref 150–400)
RBC: 4.69 MIL/uL (ref 4.22–5.81)
RDW: 13.9 % (ref 11.5–15.5)
WBC: 5.8 10*3/uL (ref 4.0–10.5)
nRBC: 0 % (ref 0.0–0.2)

## 2023-08-17 LAB — SURGICAL PCR SCREEN
MRSA, PCR: NEGATIVE
Staphylococcus aureus: NEGATIVE

## 2023-08-17 LAB — ABO/RH: ABO/RH(D): O POS

## 2023-08-17 SURGERY — TRANSCAROTID ARTERY REVASCULARIZATION (TCAR)
Anesthesia: General | Site: Neck | Laterality: Right

## 2023-08-17 MED ORDER — PHENOL 1.4 % MT LIQD: 1.0000 | OROMUCOSAL | Status: DC | PRN

## 2023-08-17 MED ORDER — CHLORHEXIDINE GLUCONATE 0.12 % MT SOLN: 15.0000 mL | Freq: Once | OROMUCOSAL | Status: AC

## 2023-08-17 MED ORDER — LACTATED RINGERS IV SOLN: INTRAVENOUS | Status: DC

## 2023-08-17 MED ORDER — IODIXANOL 320 MG/ML IV SOLN
INTRAVENOUS | Status: DC | PRN
  Administered 2023-08-17: 20 mL via INTRA_ARTERIAL

## 2023-08-17 MED ORDER — MORPHINE SULFATE (PF) 2 MG/ML IV SOLN: 2.0000 mg | INTRAVENOUS | Status: DC | PRN

## 2023-08-17 MED ORDER — MAGNESIUM SULFATE 2 GM/50ML IV SOLN: 2.0000 g | Freq: Every day | INTRAVENOUS | Status: DC | PRN

## 2023-08-17 MED ORDER — PROPOFOL 10 MG/ML IV BOLUS
INTRAVENOUS | Status: DC | PRN
  Administered 2023-08-17: 100 mg via INTRAVENOUS

## 2023-08-17 MED ORDER — ALUM & MAG HYDROXIDE-SIMETH 200-200-20 MG/5ML PO SUSP: 15.0000 mL | ORAL | Status: DC | PRN

## 2023-08-17 MED ORDER — LIDOCAINE 2% (20 MG/ML) 5 ML SYRINGE
INTRAMUSCULAR | Status: AC
  Filled 2023-08-17: qty 5

## 2023-08-17 MED ORDER — OXYCODONE HCL 5 MG PO TABS: 5.0000 mg | ORAL_TABLET | Freq: Once | ORAL | Status: DC | PRN

## 2023-08-17 MED ORDER — FENTANYL CITRATE (PF) 250 MCG/5ML IJ SOLN
INTRAMUSCULAR | Status: AC
  Filled 2023-08-17: qty 5

## 2023-08-17 MED ORDER — SODIUM CHLORIDE 0.9 % IV SOLN: 500.0000 mL | Freq: Once | INTRAVENOUS | Status: DC | PRN

## 2023-08-17 MED ORDER — LIDOCAINE HCL (PF) 1 % IJ SOLN
INTRAMUSCULAR | Status: AC
  Filled 2023-08-17: qty 30

## 2023-08-17 MED ORDER — PHENYLEPHRINE HCL-NACL 20-0.9 MG/250ML-% IV SOLN
INTRAVENOUS | Status: DC | PRN
  Administered 2023-08-17: 40 ug via INTRAVENOUS
  Administered 2023-08-17: 80 ug/min via INTRAVENOUS
  Administered 2023-08-17: 40 ug via INTRAVENOUS

## 2023-08-17 MED ORDER — PHENYLEPHRINE 80 MCG/ML (10ML) SYRINGE FOR IV PUSH (FOR BLOOD PRESSURE SUPPORT)
PREFILLED_SYRINGE | INTRAVENOUS | Status: DC | PRN
  Administered 2023-08-17: 80 ug via INTRAVENOUS

## 2023-08-17 MED ORDER — ONDANSETRON HCL 4 MG/2ML IJ SOLN: 4.0000 mg | Freq: Once | INTRAMUSCULAR | Status: DC | PRN

## 2023-08-17 MED ORDER — ONDANSETRON HCL 4 MG/2ML IJ SOLN
INTRAMUSCULAR | Status: AC
  Filled 2023-08-17: qty 2

## 2023-08-17 MED ORDER — METOPROLOL TARTRATE 5 MG/5ML IV SOLN: 2.0000 mg | INTRAVENOUS | Status: DC | PRN

## 2023-08-17 MED ORDER — DOCUSATE SODIUM 100 MG PO CAPS
100.0000 mg | ORAL_CAPSULE | Freq: Every day | ORAL | Status: DC
  Administered 2023-08-18: 100 mg via ORAL
  Filled 2023-08-17 (×2): qty 1

## 2023-08-17 MED ORDER — HEPARIN SODIUM (PORCINE) 1000 UNIT/ML IJ SOLN
INTRAMUSCULAR | Status: DC | PRN
  Administered 2023-08-17: 9000 [IU] via INTRAVENOUS
  Administered 2023-08-17: 3000 [IU] via INTRAVENOUS

## 2023-08-17 MED ORDER — GLYCOPYRROLATE PF 0.2 MG/ML IJ SOSY
PREFILLED_SYRINGE | INTRAMUSCULAR | Status: AC
  Filled 2023-08-17: qty 1

## 2023-08-17 MED ORDER — HYDRALAZINE HCL 20 MG/ML IJ SOLN: 5.0000 mg | INTRAMUSCULAR | Status: DC | PRN

## 2023-08-17 MED ORDER — GUAIFENESIN-DM 100-10 MG/5ML PO SYRP: 15.0000 mL | ORAL_SOLUTION | ORAL | Status: DC | PRN

## 2023-08-17 MED ORDER — SUGAMMADEX SODIUM 200 MG/2ML IV SOLN
INTRAVENOUS | Status: DC | PRN
  Administered 2023-08-17: 300 mg via INTRAVENOUS

## 2023-08-17 MED ORDER — LABETALOL HCL 5 MG/ML IV SOLN: 10.0000 mg | INTRAVENOUS | Status: DC | PRN

## 2023-08-17 MED ORDER — PROTAMINE SULFATE 10 MG/ML IV SOLN
INTRAVENOUS | Status: DC | PRN
  Administered 2023-08-17: 60 mg via INTRAVENOUS

## 2023-08-17 MED ORDER — ACETAMINOPHEN 650 MG RE SUPP: 325.0000 mg | RECTAL | Status: DC | PRN

## 2023-08-17 MED ORDER — OXYCODONE-ACETAMINOPHEN 5-325 MG PO TABS: 1.0000 | ORAL_TABLET | ORAL | Status: DC | PRN

## 2023-08-17 MED ORDER — GLYCOPYRROLATE 0.2 MG/ML IJ SOLN
INTRAMUSCULAR | Status: DC | PRN
Start: 2023-08-17 — End: 2023-08-17
  Administered 2023-08-17: .2 mg via INTRAVENOUS

## 2023-08-17 MED ORDER — HEMOSTATIC AGENTS (NO CHARGE) OPTIME
TOPICAL | Status: DC | PRN
Start: 2023-08-17 — End: 2023-08-17
  Administered 2023-08-17: 1

## 2023-08-17 MED ORDER — ATROPINE SULFATE 0.4 MG/ML IV SOLN
INTRAVENOUS | Status: AC
  Filled 2023-08-17: qty 1

## 2023-08-17 MED ORDER — FENTANYL CITRATE (PF) 100 MCG/2ML IJ SOLN: 25.0000 ug | INTRAMUSCULAR | Status: DC | PRN

## 2023-08-17 MED ORDER — ORAL CARE MOUTH RINSE: 15.0000 mL | Freq: Once | OROMUCOSAL | Status: AC

## 2023-08-17 MED ORDER — ACETAMINOPHEN 500 MG PO TABS: 1000.0000 mg | ORAL_TABLET | Freq: Once | ORAL | Status: AC

## 2023-08-17 MED ORDER — CLEVIDIPINE BUTYRATE 0.5 MG/ML IV EMUL
INTRAVENOUS | Status: AC
  Filled 2023-08-17: qty 50

## 2023-08-17 MED ORDER — OXYCODONE HCL 5 MG/5ML PO SOLN: 5.0000 mg | Freq: Once | ORAL | Status: DC | PRN

## 2023-08-17 MED ORDER — PROPOFOL 10 MG/ML IV BOLUS
INTRAVENOUS | Status: AC
  Filled 2023-08-17: qty 20

## 2023-08-17 MED ORDER — CHLORHEXIDINE GLUCONATE 0.12 % MT SOLN
OROMUCOSAL | Status: AC
  Administered 2023-08-17: 15 mL via OROMUCOSAL
  Filled 2023-08-17: qty 15

## 2023-08-17 MED ORDER — DEXAMETHASONE SODIUM PHOSPHATE 10 MG/ML IJ SOLN
INTRAMUSCULAR | Status: DC | PRN
  Administered 2023-08-17: 5 mg via INTRAVENOUS

## 2023-08-17 MED ORDER — PROTAMINE SULFATE 10 MG/ML IV SOLN
INTRAVENOUS | Status: AC
  Filled 2023-08-17: qty 10

## 2023-08-17 MED ORDER — DEXAMETHASONE SODIUM PHOSPHATE 10 MG/ML IJ SOLN
INTRAMUSCULAR | Status: AC
  Filled 2023-08-17: qty 1

## 2023-08-17 MED ORDER — EPHEDRINE SULFATE-NACL 50-0.9 MG/10ML-% IV SOSY
PREFILLED_SYRINGE | INTRAVENOUS | Status: DC | PRN
  Administered 2023-08-17: 5 mg via INTRAVENOUS

## 2023-08-17 MED ORDER — SODIUM CHLORIDE 0.9 % IV SOLN: INTRAVENOUS | Status: DC

## 2023-08-17 MED ORDER — ROCURONIUM BROMIDE 10 MG/ML (PF) SYRINGE
PREFILLED_SYRINGE | INTRAVENOUS | Status: AC
  Filled 2023-08-17: qty 10

## 2023-08-17 MED ORDER — MIDAZOLAM HCL 2 MG/2ML IJ SOLN
INTRAMUSCULAR | Status: DC | PRN
  Administered 2023-08-17: 1 mg via INTRAVENOUS

## 2023-08-17 MED ORDER — FENTANYL CITRATE (PF) 250 MCG/5ML IJ SOLN
INTRAMUSCULAR | Status: DC | PRN
  Administered 2023-08-17: 50 ug via INTRAVENOUS
  Administered 2023-08-17: 100 ug via INTRAVENOUS

## 2023-08-17 MED ORDER — ROCURONIUM BROMIDE 10 MG/ML (PF) SYRINGE
PREFILLED_SYRINGE | INTRAVENOUS | Status: DC | PRN
  Administered 2023-08-17: 10 mg via INTRAVENOUS
  Administered 2023-08-17: 50 mg via INTRAVENOUS
  Administered 2023-08-17: 10 mg via INTRAVENOUS
  Administered 2023-08-17: 20 mg via INTRAVENOUS

## 2023-08-17 MED ORDER — ACETAMINOPHEN 325 MG PO TABS
325.0000 mg | ORAL_TABLET | ORAL | Status: DC | PRN
  Administered 2023-08-17: 650 mg via ORAL
  Filled 2023-08-17: qty 2

## 2023-08-17 MED ORDER — MIDAZOLAM HCL 2 MG/2ML IJ SOLN
INTRAMUSCULAR | Status: AC
  Filled 2023-08-17: qty 2

## 2023-08-17 MED ORDER — ONDANSETRON HCL 4 MG/2ML IJ SOLN: 4.0000 mg | Freq: Four times a day (QID) | INTRAMUSCULAR | Status: DC | PRN

## 2023-08-17 MED ORDER — HEPARIN 6000 UNIT IRRIGATION SOLUTION
Status: DC | PRN
  Administered 2023-08-17: 1

## 2023-08-17 MED ORDER — POTASSIUM CHLORIDE CRYS ER 20 MEQ PO TBCR: 20.0000 meq | EXTENDED_RELEASE_TABLET | Freq: Every day | ORAL | Status: DC | PRN

## 2023-08-17 MED ORDER — CEFAZOLIN SODIUM 1 G IJ SOLR
INTRAMUSCULAR | Status: AC
  Filled 2023-08-17: qty 20

## 2023-08-17 MED ORDER — ONDANSETRON HCL 4 MG/2ML IJ SOLN
INTRAMUSCULAR | Status: DC | PRN
  Administered 2023-08-17: 4 mg via INTRAVENOUS

## 2023-08-17 MED ORDER — CEFAZOLIN SODIUM-DEXTROSE 2-3 GM-%(50ML) IV SOLR
INTRAVENOUS | Status: DC | PRN
  Administered 2023-08-17: 2 g via INTRAVENOUS

## 2023-08-17 MED ORDER — HEPARIN 6000 UNIT IRRIGATION SOLUTION
Status: AC
  Filled 2023-08-17: qty 500

## 2023-08-17 MED ORDER — ATROPINE SULFATE 0.4 MG/ML IV SOLN
INTRAVENOUS | Status: DC | PRN
  Administered 2023-08-17: .2 mg via INTRAVENOUS

## 2023-08-17 MED ORDER — BISACODYL 10 MG RE SUPP: 10.0000 mg | Freq: Every day | RECTAL | Status: DC | PRN

## 2023-08-17 MED ORDER — LIDOCAINE 2% (20 MG/ML) 5 ML SYRINGE
INTRAMUSCULAR | Status: DC | PRN
  Administered 2023-08-17: 60 mg via INTRAVENOUS

## 2023-08-17 MED ORDER — POLYETHYLENE GLYCOL 3350 17 G PO PACK: 17.0000 g | PACK | Freq: Every day | ORAL | Status: DC | PRN

## 2023-08-17 MED ORDER — 0.9 % SODIUM CHLORIDE (POUR BTL) OPTIME
TOPICAL | Status: DC | PRN
  Administered 2023-08-17: 1000 mL

## 2023-08-17 MED ORDER — ACETAMINOPHEN 500 MG PO TABS
ORAL_TABLET | ORAL | Status: AC
  Administered 2023-08-17: 1000 mg via ORAL
  Filled 2023-08-17: qty 2

## 2023-08-17 SURGICAL SUPPLY — 47 items
BAG BANDED W/RUBBER/TAPE 36X54 (MISCELLANEOUS) ×3 IMPLANT
BAG COUNTER SPONGE SURGICOUNT (BAG) ×3 IMPLANT
CANISTER SUCT 3000ML PPV (MISCELLANEOUS) ×3 IMPLANT
CATH BALLN ENROUTE 6X35 (CATHETERS) IMPLANT
CATH BEACON 5 .035 40 KMP TP (CATHETERS) IMPLANT
CATH ROBINSON RED A/P 18FR (CATHETERS) IMPLANT
CLIP TI MEDIUM 6 (CLIP) ×3 IMPLANT
CLIP TI WIDE RED SMALL 6 (CLIP) ×3 IMPLANT
COVER DOME SNAP 22 D (MISCELLANEOUS) ×3 IMPLANT
COVER PROBE W GEL 5X96 (DRAPES) ×3 IMPLANT
DERMABOND ADVANCED .7 DNX12 (GAUZE/BANDAGES/DRESSINGS) ×3 IMPLANT
DRAPE C-ARM 42X72 X-RAY (DRAPES) IMPLANT
DRAPE FEMORAL ANGIO 80X135IN (DRAPES) ×3 IMPLANT
ELECT REM PT RETURN 9FT ADLT (ELECTROSURGICAL) ×2 IMPLANT
ELECTRODE REM PT RTRN 9FT ADLT (ELECTROSURGICAL) ×3 IMPLANT
GLOVE BIOGEL PI IND STRL 7.0 (GLOVE) ×3 IMPLANT
GOWN STRL REUS W/ TWL LRG LVL3 (GOWN DISPOSABLE) ×6 IMPLANT
GOWN STRL REUS W/ TWL XL LVL3 (GOWN DISPOSABLE) ×3 IMPLANT
GUIDEWIRE ENROUTE 0.014 (WIRE) ×3 IMPLANT
HEMOSTAT SNOW SURGICEL 2X4 (HEMOSTASIS) IMPLANT
KIT BASIN OR (CUSTOM PROCEDURE TRAY) ×3 IMPLANT
KIT ENCORE 26 ADVANTAGE (KITS) ×3 IMPLANT
KIT INTRODUCER GALT 7 (INTRODUCER) ×3 IMPLANT
KIT TURNOVER KIT B (KITS) ×3 IMPLANT
NDL HYPO 25GX1X1/2 BEV (NEEDLE) IMPLANT
NEEDLE HYPO 25GX1X1/2 BEV (NEEDLE) IMPLANT
PACK CAROTID (CUSTOM PROCEDURE TRAY) ×3 IMPLANT
POSITIONER HEAD DONUT 9IN (MISCELLANEOUS) ×3 IMPLANT
POWDER SURGICEL 3.0 GRAM (HEMOSTASIS) IMPLANT
SET MICROPUNCTURE 5F STIFF (MISCELLANEOUS) ×3 IMPLANT
STENT TRANSCAROTID SYS 10X40 (Permanent Stent) IMPLANT
SUT MNCRL AB 4-0 PS2 18 (SUTURE) ×3 IMPLANT
SUT PROLENE 5 0 RB 2 (SUTURE) IMPLANT
SUT PROLENE 6 0 BV (SUTURE) ×3 IMPLANT
SUT SILK 2 0 PERMA HAND 18 BK (SUTURE) ×3 IMPLANT
SUT SILK 2 0 SH (SUTURE) IMPLANT
SUT SILK 3-0 18XBRD TIE 12 (SUTURE) IMPLANT
SUT VIC AB 2-0 CT1 TAPERPNT 27 (SUTURE) ×3 IMPLANT
SUT VIC AB 3-0 SH 27X BRD (SUTURE) ×3 IMPLANT
SYR 10ML LL (SYRINGE) ×9 IMPLANT
SYR 20ML LL LF (SYRINGE) ×3 IMPLANT
SYR CONTROL 10ML LL (SYRINGE) IMPLANT
SYSTEM TRANSCAROTID NEUROPRTCT (MISCELLANEOUS) ×3 IMPLANT
TOWEL GREEN STERILE (TOWEL DISPOSABLE) ×3 IMPLANT
TRANSCAROTID NEUROPROTECT SYS (MISCELLANEOUS) ×2 IMPLANT
WATER STERILE IRR 1000ML POUR (IV SOLUTION) ×3 IMPLANT
WIRE BENTSON .035X145CM (WIRE) ×3 IMPLANT

## 2023-08-17 NOTE — Anesthesia Procedure Notes (Signed)
 Procedure Name: Intubation Date/Time: 08/17/2023 7:47 AM  Performed by: Heron Sabins, CRNAPre-anesthesia Checklist: Patient identified, Emergency Drugs available, Suction available and Patient being monitored Patient Re-evaluated:Patient Re-evaluated prior to induction Oxygen Delivery Method: Circle System Utilized Preoxygenation: Pre-oxygenation with 100% oxygen Induction Type: IV induction Ventilation: Mask ventilation without difficulty Laryngoscope Size: Mac and 3 Grade View: Grade II Tube type: Oral Tube size: 7.5 mm Number of attempts: 1 Airway Equipment and Method: Stylet and Oral airway Placement Confirmation: ETT inserted through vocal cords under direct vision, positive ETCO2 and breath sounds checked- equal and bilateral Secured at: 24 cm Tube secured with: Tape Dental Injury: Teeth and Oropharynx as per pre-operative assessment

## 2023-08-17 NOTE — Progress Notes (Signed)
  Progress Note    08/17/2023 7:28 AM * Day of Surgery *  Subjective:  no complaints, says he feels great, ready for surgery    Vitals:   08/17/23 0421 08/17/23 0645  BP: 135/88 125/87  Pulse: 77 63  Resp: 17 18  Temp: 98.3 F (36.8 C) 98.2 F (36.8 C)  SpO2: 98% 97%    Physical Exam: General:  resting comfortably Lungs:  nonlabored Extremities:  moving all extremities equally Neuro: awake and alert  CBC    Component Value Date/Time   WBC 5.6 08/16/2023 0646   RBC 4.90 08/16/2023 0646   HGB 14.8 08/16/2023 0646   HGB 14.5 11/28/2019 1049   HCT 43.5 08/16/2023 0646   HCT 42.3 11/28/2019 1049   PLT 147 (L) 08/16/2023 0646   PLT 163 11/28/2019 1049   MCV 88.8 08/16/2023 0646   MCV 91 11/28/2019 1049   MCV 92 06/12/2012 0540   MCH 30.2 08/16/2023 0646   MCHC 34.0 08/16/2023 0646   RDW 13.9 08/16/2023 0646   RDW 13.7 11/28/2019 1049   RDW 13.3 06/12/2012 0540   LYMPHSABS 1.1 10/27/2022 0906   MONOABS 0.5 10/27/2022 0906   EOSABS 0.1 10/27/2022 0906   BASOSABS 0.0 10/27/2022 0906    BMET    Component Value Date/Time   NA 139 08/16/2023 0646   NA 138 11/28/2019 1049   NA 139 06/12/2012 0540   K 3.9 08/16/2023 0646   K 3.8 06/12/2012 0540   CL 106 08/16/2023 0646   CL 108 (H) 06/12/2012 0540   CO2 23 08/16/2023 0646   CO2 21 06/12/2012 0540   GLUCOSE 124 (H) 08/16/2023 0646   GLUCOSE 128 (H) 06/12/2012 0540   BUN 9 08/16/2023 0646   BUN 10 11/28/2019 1049   BUN 12 06/12/2012 0540   CREATININE 0.90 08/16/2023 0646   CREATININE 0.91 09/27/2015 1414   CALCIUM 9.2 08/16/2023 0646   CALCIUM 8.4 (L) 06/12/2012 0540   GFRNONAA >60 08/16/2023 0646   GFRNONAA >60 06/12/2012 0540   GFRAA >60 01/20/2020 0413   GFRAA >60 06/12/2012 0540    INR    Component Value Date/Time   INR 1.1 01/19/2020 1455   INR 1.0 06/12/2012 0540    No intake or output data in the 24 hours ending 08/17/23 1478    Assessment/Plan:  78 y.o. male with symptomatic right ICA  stenosis   -Patient is feeling great this morning without any issues -Plan is for right TCAR today  [name] MD [facility] Phone Number: [phone] 08/17/2023 7:28 AM

## 2023-08-17 NOTE — Progress Notes (Signed)
 The patient is transferred to OR in stable condition. He was accompanied by the OR tech and his wife. Telemetry removed per Dr. Orest Dikes order.

## 2023-08-17 NOTE — Transfer of Care (Signed)
 Immediate Anesthesia Transfer of Care Note  Patient: Kevin Webb  Procedure(s) Performed: RIGHT TRANSCAROTID ARTERY REVASCULARIZATION (TCAR) (Right: Neck) ULTRASOUND GUIDANCE, FOR VASCULAR ACCESS (Bilateral: Groin)  Patient Location: PACU  Anesthesia Type:General  Level of Consciousness: awake, alert , and oriented  Airway & Oxygen Therapy: Patient Spontanous Breathing and Patient connected to face mask oxygen  Post-op Assessment: Report given to RN and Post -op Vital signs reviewed and stable  Post vital signs: Reviewed and stable  Last Vitals:  Vitals Value Taken Time  BP 118/54 08/17/23 0932  Temp    Pulse 75 08/17/23 0942  Resp 15 08/17/23 0942  SpO2 96 % 08/17/23 0942  Vitals shown include unfiled device data.  Last Pain:  Vitals:   08/17/23 0645  TempSrc: Oral  PainSc: 0-No pain         Complications: No notable events documented.

## 2023-08-17 NOTE — Op Note (Signed)
 OPERATIVE NOTE  PROCEDURE:   Right carotid stent placement (TCAR) Flow reversal embolic neuro-protection Ultrasound-guided Left femoral vein access  PRE-OPERATIVE DIAGNOSIS: Symptomatic Right carotid artery stenosis  POST-OPERATIVE DIAGNOSIS: same as above   SURGEON: [name] MD  ASSISTANT(S): Doreatha Massed, PA  Given the complexity of the case,  the assistant was necessary in order to expedient the procedure and safely perform the technical aspects of the operation.  The assistant provided traction and countertraction while inserting the TCAR sheath. The assistant also played a critical role in pinning wires while advancing balloons and the stent across the critical stenosis. These skills could not have been adequately performed by a scrub tech assistant.    ANESTHESIA: general  ESTIMATED BLOOD LOSS: 25 cc  FINDING(S): Approximate 60% R ICA stenosis Widely patent stent with brisk flow after post dilation   SPECIMEN(S):  none  INDICATIONS:   Kevin Webb is a 78 y.o. male who presents with right symptomatic carotid stenosis 65%.  I discussed with the patient the risks, benefits, medical management, carotid endarterectomy and TCAR.  The patient is a candidate for TCAR. I discussed the procedural details of both carotid endarterectomy and TCAR with the patient and recommended TCAR based on his anatomy and medical comorbidity's.  The patient is aware that the risks of TCAR include but are not limited to: bleeding, infection, stroke, myocardial infarction, death, cranial nerve injuries both temporary and permanent, neck hematoma, possible airway compromise, labile blood pressure post-operatively, cerebral hyperperfusion syndrome, and possible need for additional interventions in the future. The patient is aware of the risks and agrees to proceed forward with the procedure.  DESCRIPTION: The patient was transferred to the operating room and positioned supine on the operating  room table. Anesthesia was induced. The neck and groins were prepped and draped in standard fashion. A surgical timeout was performed confirming correct patient, procedure, and operative location.  The left common femoral vein (CFV) was accessed under ultrasound guidance, using standard Seldinger and micropuncture access technique. Permanent recorded image(s) was/were saved in the patient's medical record. The Venous Return Sheath was advanced into the CFV over the 0.035" wire provided. Blood was aspirated from the flow line followed by flushing of the Venous Sheath with heparinized saline. The Venous Sheath was secured to the patient's skin with suture to maintain optimal position in the vessel.   Using intraoperative ultrasound the course of the left common carotid artery was mapped on the skin. A longitudinal 3-4 cm incision was made between the sternal and clavicular heads of the sternocleidomastoid muscle. The omohyoid was transected. Following longitudinal division of the carotid sheath the jugular vein was partially dissected and retracted laterally. Once 3 cm of common carotid artery (CCA) were isolated, umbilical tape was placed around the proximal 1/3 of the CCA under direct vision. A vessel loop was also placed around the artery just proximal to the umbilical tape in Pots fashion. A 6-0 polypropylene suture was pre-placed in the anterior wall of the CCA, in a "U stitch" configuration, close to the clavicle to facilitate hemostasis upon removal of the arterial sheath at completion of the TCAR procedure.  Heparin was given to obtain a therapeutic activated clotting time >250 seconds prior to arterial access. A 4-French non-stiffened ENHANCE Transcarotid / Peripheral Access set was used, puncturing the artery with the 21G needle through the pre-placed "U" stitch while holding gentle traction on the umbilical tape to stabilize and centralize the CCA within the incision. Careful attention  was paid to  the change in CCA shape when using the umbilical tape to control or lift the artery. The micropuncture wire was then advanced 3-4 cm into the CCA and, the 21G needle was removed. The micropuncture sheath was advanced 2-3 cm into the CCA and the wire and dilator were removed. Pulsatile backflow indicated correct positioning.  Prior to inserting the stiff J-tipped wire the micro sheath became dislodged from the artery.  The previously placed U-stitch was tied down and artery was reaccessed slightly distal to this first access after a new U-stitch was placed with a 6-0 Prolene.    The micropuncture wire was then advanced 3-4 cm into the CCA and, the 21G needle was removed. The micropuncture sheath was advanced 2-3 cm into the CCA and the wire and dilator were removed. Pulsatile backflow indicated correct positioning.  The provided 0.035" J-tipped guidewire was inserted as close as possible to the bifurcation without engaging the lesion. After micropuncture sheath removal, the Transcarotid Arterial Sheath was advanced to the 2.5cm marker and the 0.035" wire and dilator were then removed. Arterial Sheath position was assessed under fluoroscopy in two projections to ensure that the sheath tip was oriented coaxially in the CCA. The Arterial Sheath was sutured to the patient with gentle forward tension. Blood was slowly aspirated followed by flushing with heparinized saline. No ingress of air bubbles through the passive hemostatic valve was observed. The stopcocks were closed. Traction applied to the CCA previously to facilitate access was gently released.   The Flow Controller was connected to the Transcarotid Arterial Sheath, prepared by passively allowing a column of arterial blood to fill the line and connected to the Venous Return Sheath. Passive flow reversal was confirmed with a saline bolus deliver into the venous flow line on the "Low" flow setting of the Flow Controller. The flow setting was switched to "High"  and the CCA inflow was occluded proximal to the arteriotomy with the vessel loop to achieve active flow reversal. To confirm active flow reversal, a saline bolus was delivered into the venous flow line on the "High" flow setting of the Flow Controller. Angiograms were performed with slow injections of a small amount of contrast filling just past the lesion to minimize antegrade transmission of micro-bubbles.  Prior to lesion manipulation, heart rate (70bpm) and systolic BP (90-110 MAP) were managed upwards to optimize flow reversal and procedural neuroprotection. The lesion was crossed with an 0.014" ENROUTE guidewire and pre-dilation of the lesion was performed with a 6 mm x 35 mm rapid exchange 0.014" compatible balloon catheter to 8 atmospheres.  It should be noted that during predilation and severe bradycardia and went asystolic.  Anesthesia administered glycopyrrolate, atropine and epinephrine and he recovered shortly after.  Stenting was then performed with an 10 mm x 40 mm ENROUTE Transcarotid stent, sized appropriately to the CCA.  Still shots of the stent demonstrated that there was still a narrowed area in the midportion for that reason we postdilated with the 6 x 35 balloon to a pressure of 12 atm and the balloon was left up for a number of seconds.  It should be noted that he did not have any bradycardia issues with this inflation.  AP and lateral angiograms (gentle contrast injections) were performed to confirm stent placement and arterial wall stent apposition.  At [facility] case completion, antegrade flow was restored by releasing the vessel loop on the CCA then closing the NPS stopcocks to the flow lines. The Transcarotid Arterial Sheath  was removed and the pre-closure suture was tied. Heparin was reversed, the wound was copiously irrigated and hemostasis achieved. The heads of the SCM was re-approximated with a 2-0 Vicryl, the platysma was closed with a 3-0 Vicryl and the skin with 4-0 Monocryl.    The Venous Return Sheath was removed and hemostasis was achieved with brief manual compression.  The patient tolerated the procedure well and was extubated on the table. The patient was moving all four extremities to command prior to transfer to the recovery room.    COMPLICATIONS: Asystole during predilation, recovered quickly with medication  CONDITION: Stable  [name] MD [facility] Phone Number: [phone] 08/17/2023 9:33 AM

## 2023-08-17 NOTE — Plan of Care (Signed)
  Problem: Clinical Measurements: Goal: Will remain free from infection Outcome: Progressing   Problem: Clinical Measurements: Goal: Diagnostic test results will improve Outcome: Progressing   Problem: Clinical Measurements: Goal: Respiratory complications will improve Outcome: Progressing   Problem: Clinical Measurements: Goal: Cardiovascular complication will be avoided Outcome: Progressing   Problem: Elimination: Goal: Will not experience complications related to urinary retention Outcome: Progressing

## 2023-08-17 NOTE — Progress Notes (Signed)
 Patient arrived in the unit from PACU, he is alert and oriented, NIHS 0, Vital signs monitored, CCMD notified, CHG bath given, call bell in reach.   08/17/23 1420  Vitals  Temp 98.2 F (36.8 C)  Temp Source Oral  BP 124/61  MAP (mmHg) 80  BP Location Left Arm  BP Method Automatic  Patient Position (if appropriate) Lying  Pulse Rate (!) 53  Pulse Rate Source Monitor  ECG Heart Rate (!) 53  Resp 15  Level of Consciousness  Level of Consciousness Alert  MEWS COLOR  MEWS Score Color Green  Oxygen Therapy  SpO2 94 %  O2 Device Room Air  Pain Assessment  Pain Scale 0-10  Pain Score 0  MEWS Score  MEWS Temp 0  MEWS Systolic 0  MEWS Pulse 0  MEWS RR 0  MEWS LOC 0  MEWS Score 0

## 2023-08-17 NOTE — Plan of Care (Signed)
  Problem: Education: Goal: Knowledge of General Education information will improve Description: Including pain rating scale, medication(s)/side effects and non-pharmacologic comfort measures Outcome: Progressing   Problem: Clinical Measurements: Goal: Ability to maintain clinical measurements within normal limits will improve Outcome: Progressing Goal: Will remain free from infection Outcome: Progressing Goal: Respiratory complications will improve Outcome: Progressing Goal: Cardiovascular complication will be avoided Outcome: Progressing   Problem: Activity: Goal: Risk for activity intolerance will decrease Outcome: Progressing   Problem: Elimination: Goal: Will not experience complications related to urinary retention Outcome: Progressing   Problem: Pain Managment: Goal: General experience of comfort will improve and/or be controlled Outcome: Progressing

## 2023-08-17 NOTE — Anesthesia Postprocedure Evaluation (Signed)
 Anesthesia Post Note  Patient: SANJIT MCMICHAEL  Procedure(s) Performed: RIGHT TRANSCAROTID ARTERY REVASCULARIZATION (TCAR) (Right: Neck) ULTRASOUND GUIDANCE, FOR VASCULAR ACCESS (Bilateral: Groin)     Patient location during evaluation: PACU Anesthesia Type: General Level of consciousness: awake and alert Pain management: pain level controlled Vital Signs Assessment: post-procedure vital signs reviewed and stable Respiratory status: spontaneous breathing, nonlabored ventilation, respiratory function stable and patient connected to nasal cannula oxygen Cardiovascular status: blood pressure returned to baseline and stable Postop Assessment: no apparent nausea or vomiting Anesthetic complications: no  No notable events documented.  Last Vitals:  Vitals:   08/17/23 1015 08/17/23 1030  BP: (!) 99/50 114/62  Pulse: (!) 59 (!) 55  Resp: 15 12  Temp:    SpO2: 95% 100%    Last Pain:  Vitals:   08/17/23 1030  TempSrc:   PainSc: 0-No pain                 Beryle Lathe

## 2023-08-17 NOTE — Discharge Instructions (Signed)

## 2023-08-17 NOTE — Progress Notes (Signed)
 PROGRESS NOTE  Kevin Webb:[id number] DOB: 1946-01-30   PCP: Dana Allan, MD  Patient is from: Home.  Independently ambulates at baseline.  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  Had difficulty picking up the razor with his right hand on 3/27 that lasted few minutes and resolved.  Eventually able to grasp the razor and shave.  And had some blurry vision and incoordination with mobility at work that has resolved after he took a nap.  He has not had further symptoms but concerned after he read about stroke and decided to come to ED.  Patient is on Plavix and aspirin with good compliance.  Drinks 3-4 beers and 4 ounces of whiskey a day.  Does not smoke.  In ED, bradycardic to 40s and 50s.  BMP and CBC without significant finding.  Serial troponin negative.  EKG showed sinus bradycardia without acute ischemic changes.  CXR and CT head without acute finding.  MRI brain showed a 10 mm focus of acute infarct in the right centrum semiovale, additional punctate foci of acute infarct involving the right precentral gyrus near the hand knob as well as posterior right parietal cortex and lateral right temporal cortex.  CT angio head and neck showed 65% right cervical ICA stenosis and 60% left cervical ICA stenosis.  Neurology consulted and recommended complete CVA workup and vascular surgery consult.  TTE without significant finding.  Remains asymptomatic.  Patient underwent right TCAR by Dr. Hetty Blend on 08/17/2023.  Subjective: Seen and examined earlier this afternoon while in PACU after TCAR.  Feels well.  No complaints.  Objective: Vitals:   08/17/23 1200 08/17/23 1230 08/17/23 1300 08/17/23 1420  BP: 107/65 116/66   124/61  Pulse: (!) 51 (!) 50 (!) 50 (!) 53  Resp: 14 16 13 15   Temp: 97.8 F (36.6 C)   98.2 F (36.8 C)  TempSrc:    Oral  SpO2: 99% 97%  94%  Weight:      Height:        Examination:  GENERAL: No apparent distress.  Nontoxic. HEENT: MMM.  Vision and hearing grossly intact.  NECK: Supple.  No apparent JVD.  Surgical site DCI. RESP:  No IWOB.  Fair aeration bilaterally. CVS:  RRR. Heart sounds normal.  ABD/GI/GU: BS+. Abd soft, NTND.  MSK/EXT:   No apparent deformity. Moves extremities. No edema.  SKIN: no apparent skin lesion or wound NEURO: Awake and alert. Oriented appropriately.  No apparent focal neuro deficit. PSYCH: Calm. Normal affect.   Consultants:  Neurology Vascular surgery  Procedures: 4/1-right TCAR by Dr. Hetty Blend  Microbiology summarized: None  Assessment and plan: Acute CVA: Patient had brief difficulty using his right hand followed by blurry vision and gait disturbance on on 3/27.  MRI finding does not explain right hand issue or blurry vision.  CT angio head and neck with 65% right cervical ICA stenosis and 60% left cervical ICA stenosis.  He admits to occasional lightheadedness when he stands up raising concern for presyncope.  Reports good compliance with Plavix and aspirin.  TTE without significant finding.  LDL is only 42.  A1c 6.0%.  No history of A-fib.  Remains on symptomatic. -S/p right  TCAR on/05/2023 by Dr. Hetty Blend -Continue Plavix, aspirin and statin -PT/OT/SLP-no need identified.  Bilateral cervical ICA stenosis -S/p right TCAR by Dr. Hetty Blend on 4/1 -Plavix, aspirin and statin as above  CAD s/p CABG in 2000 and graft stent in 2018.  LHC in 2021 with severe three-vessel occlusive CAD.  He is on Imdur, Plavix, aspirin, Toprol-XL, ramipril, Crestor and Zetia.  Normotensive off home antihypertensive meds.  Also bradycardic -Continue Plavix, aspirin, Crestor and Zetia -Holding home Toprol-XL, ramipril and Imdur given normotension and  bradycardia   Sinus bradycardia: Likely due to Toprol-XL.  EKG sinus bradycardia to 56 with chronic LAD and RBBB.  TSH normal.  HDL remains low in 50s. -Continue holding Toprol-XL -Continue telemetry  Chronic chest pain: Likely musculoskeletal.  Usually when he wake In the Morning.  Serial Troponin negative.  No acute ischemic finding on EKG.  CXR without acute finding. -Tylenol as needed  Alcohol use disorder: Reports drinking 3-4 beers and about 4 ounces of whiskey a day.  No withdrawal symptoms. -Encouraged cessation/moderation.  He is determined to quit. -Discontinue CIWA. -Continue thiamine, folic acid and multivitamin  Essential hypertension: Normotensive off home antihypertensive meds. -Continue holding home antihypertensive meds.  Prediabetes: A1c 6.0%.  Not on meds at home. Recent Labs  Lab 08/16/23 1154 08/16/23 1608 08/16/23 2105 08/17/23 0605 08/17/23 0938  GLUCAP 190* 103* 143* 128* 126*  -Continue SSI-sensitive -Continue NovoLog 3 units 3 times daily with meals  Hyperlipidemia: LDL 42 -Continue Crestor and Zetia  BPH without LUTS -Continue Flomax  GERD -Continue PPI  Body mass index is 28.8 kg/m.           DVT prophylaxis:  SCD's Start: 08/17/23 1419 SCD's Start: 08/14/23 0103  Code Status: Full code Family Communication: None at bedside Level of care: Progressive Status is: Inpatient Remains inpatient appropriate because: Acute stroke, bilateral ICA stenosis   Final disposition: Likely home on 4/2   35 minutes with more than 50% spent in reviewing records, counseling patient/family and coordinating care.   Sch Meds:  Scheduled Meds:  aspirin EC  81 mg Oral Q0200   clopidogrel  75 mg Oral Daily   [START ON 08/18/2023] docusate sodium  100 mg Oral Daily   dutasteride  0.5 mg Oral Daily   ezetimibe  10 mg Oral Daily   folic acid  1 mg Oral Daily   insulin aspart  0-5 Units Subcutaneous QHS   insulin aspart  0-9 Units Subcutaneous TID  WC   insulin aspart  3 Units Subcutaneous TID WC   multivitamin with minerals  1 tablet Oral Daily   pantoprazole  40 mg Oral Daily   rosuvastatin  40 mg Oral QHS   tamsulosin  0.4 mg Oral Q0200   thiamine  100 mg Oral Daily   Or   thiamine  100 mg Intravenous Daily   Continuous Infusions:  sodium chloride     sodium chloride     magnesium sulfate bolus IVPB     PRN Meds:.sodium chloride, acetaminophen **OR** acetaminophen, alum & mag hydroxide-simeth, bisacodyl, guaiFENesin-dextromethorphan, hydrALAZINE, labetalol, magnesium sulfate bolus IVPB, metoprolol tartrate, morphine injection, ondansetron, oxyCODONE-acetaminophen, phenol, polyethylene glycol, potassium chloride  Antimicrobials: Anti-infectives (From admission, onward)    None        I have personally reviewed the following labs and images: CBC: Recent Labs  Lab 08/13/23 1306 08/15/23 0629 08/16/23 0646 08/17/23 0553  WBC 6.3 4.7 5.6 5.8  HGB 14.0 15.0 14.8 14.8  HCT 41.9 43.1 43.5 42.1  MCV 91.1 89.4 88.8 89.8  PLT 151 141* 147* 136*   BMP &GFR Recent Labs  Lab 08/13/23 1306 08/15/23 0629 08/16/23 0646  NA 137 140 139  K 4.0 3.8 3.9  CL 103 105 106  CO2 27 23 23   GLUCOSE 169* 131* 124*  BUN 10 9 9   CREATININE 0.86 0.81 0.90  CALCIUM 8.8* 9.2 9.2  MG  --  2.0 2.0  PHOS  --  4.4 4.1   Estimated Creatinine Clearance: 78 mL/min (by C-G formula based on SCr of 0.9 mg/dL). Liver & Pancreas: Recent Labs  Lab 08/15/23 0629  ALBUMIN 3.5   No results for input(s): "LIPASE", "AMYLASE" in the last 168 hours. No results for input(s): "AMMONIA" in the last 168 hours. Diabetic: No results for input(s): "HGBA1C" in the last 72 hours.  Recent Labs  Lab 08/16/23 1154 08/16/23 1608 08/16/23 2105 08/17/23 0605 08/17/23 0938  GLUCAP 190* 103* 143* 128* 126*   Cardiac Enzymes: No results for input(s): "CKTOTAL", "CKMB", "CKMBINDEX", "TROPONINI" in the last 168 hours. No results for input(s): "PROBNP"  in the last 8760 hours. Coagulation Profile: No results for input(s): "INR", "PROTIME" in the last 168 hours. Thyroid Function Tests: No results for input(s): "TSH", "T4TOTAL", "FREET4", "T3FREE", "THYROIDAB" in the last 72 hours.  Lipid Profile: No results for input(s): "CHOL", "HDL", "LDLCALC", "TRIG", "CHOLHDL", "LDLDIRECT" in the last 72 hours.  Anemia Panel: No results for input(s): "VITAMINB12", "FOLATE", "FERRITIN", "TIBC", "IRON", "RETICCTPCT" in the last 72 hours. Urine analysis:    Component Value Date/Time   COLORURINE YELLOW 05/05/2023 1201   APPEARANCEUR Clear 05/27/2023 1459   LABSPEC 1.010 05/05/2023 1201   PHURINE 7.5 05/05/2023 1201   GLUCOSEU Negative 05/27/2023 1459   GLUCOSEU NEGATIVE 05/05/2023 1201   HGBUR NEGATIVE 05/05/2023 1201   BILIRUBINUR Negative 05/27/2023 1459   KETONESUR NEGATIVE 05/05/2023 1201   PROTEINUR Negative 05/27/2023 1459   PROTEINUR NEGATIVE 11/11/2021 1004   UROBILINOGEN 0.2 05/05/2023 1201   NITRITE Negative 05/27/2023 1459   NITRITE NEGATIVE 05/05/2023 1201   LEUKOCYTESUR Negative 05/27/2023 1459   LEUKOCYTESUR NEGATIVE 05/05/2023 1201   Sepsis Labs: Invalid input(s): "PROCALCITONIN", "LACTICIDVEN"  Microbiology: Recent Results (from the past 240 hours)  Surgical pcr screen     Status: None   Collection Time: 08/16/23  6:23 PM   Specimen: Nasal Mucosa; Nasal Swab  Result Value Ref Range Status   MRSA, PCR NEGATIVE NEGATIVE Final   Staphylococcus aureus NEGATIVE NEGATIVE Final    Comment: (NOTE) The Xpert SA Assay (FDA approved for NASAL specimens in patients 29 years of age and older), is one component of a comprehensive surveillance program. It is not intended to diagnose infection nor to guide or monitor treatment. Performed at [facility], [address], [place], [place] [address]     Radiology Studies: No results found.     Jazarah Capili T. Kashon Kraynak [name]  If 7PM-7AM, please contact  night-coverage [email or web address] 08/17/2023, 2:34 PM

## 2023-08-17 NOTE — Progress Notes (Signed)
 MEWS Progress Note  Patient Details Name: Kevin Webb MRN: [id number] DOB: June 13, 1945 Today's Date: 08/17/2023   MEWS Flowsheet Documentation:  Assess: MEWS Score Temp: 97.6 F (36.4 C) BP: 94/66 MAP (mmHg): 72 Pulse Rate: (!) 47 ECG Heart Rate: (!) 46 Resp: 18 Level of Consciousness: Alert SpO2: 98 % O2 Device: Room Air Patient Activity (if Appropriate): In bed O2 Flow Rate (L/min): 2 L/min Assess: MEWS Score MEWS Temp: 0 MEWS Systolic: 1 MEWS Pulse: 1 MEWS RR: 0 MEWS LOC: 0 MEWS Score: 2 MEWS Score Color: Yellow Assess: SIRS CRITERIA SIRS Temperature : 0 SIRS Respirations : 0 SIRS Pulse: 0 SIRS WBC: 0 SIRS Score Sum : 0 SIRS Temperature : 0 SIRS Pulse: 0 SIRS Respirations : 0 SIRS WBC: 0 SIRS Score Sum : 0 Assess: if the MEWS score is Yellow or Red Were vital signs accurate and taken at a resting state?: Yes Does the patient meet 2 or more of the SIRS criteria?: No MEWS guidelines implemented : Yes, yellow Treat MEWS Interventions: Considered administering scheduled or prn medications/treatments as ordered Take Vital Signs Increase Vital Sign Frequency : Yellow: Q2hr x1, continue Q4hrs until patient remains green for 12hrs Escalate MEWS: Escalate: Yellow: Discuss with charge nurse and consider notifying provider and/or RRT Notify: Charge Nurse/RN Name of Charge Nurse/RN Notified: Judeth Cornfield, RN      Shemekia Patane Blanche C Marlina Cataldi 08/17/2023, 11:08 PM

## 2023-08-18 ENCOUNTER — Encounter (HOSPITAL_COMMUNITY): Payer: Self-pay | Admitting: Vascular Surgery

## 2023-08-18 DIAGNOSIS — K219 Gastro-esophageal reflux disease without esophagitis: Secondary | ICD-10-CM | POA: Diagnosis not present

## 2023-08-18 DIAGNOSIS — I639 Cerebral infarction, unspecified: Secondary | ICD-10-CM | POA: Diagnosis not present

## 2023-08-18 DIAGNOSIS — R001 Bradycardia, unspecified: Secondary | ICD-10-CM | POA: Diagnosis not present

## 2023-08-18 DIAGNOSIS — E1165 Type 2 diabetes mellitus with hyperglycemia: Secondary | ICD-10-CM | POA: Diagnosis not present

## 2023-08-18 LAB — POCT I-STAT, CHEM 8
BUN: 13 mg/dL (ref 8–23)
Calcium, Ion: 1.14 mmol/L — ABNORMAL LOW (ref 1.15–1.40)
Chloride: 105 mmol/L (ref 98–111)
Creatinine, Ser: 0.8 mg/dL (ref 0.61–1.24)
Glucose, Bld: 121 mg/dL — ABNORMAL HIGH (ref 70–99)
HCT: 38 % — ABNORMAL LOW (ref 39.0–52.0)
Hemoglobin: 12.9 g/dL — ABNORMAL LOW (ref 13.0–17.0)
Potassium: 3.8 mmol/L (ref 3.5–5.1)
Sodium: 138 mmol/L (ref 135–145)
TCO2: 22 mmol/L (ref 22–32)

## 2023-08-18 LAB — CBC
HCT: 37 % — ABNORMAL LOW (ref 39.0–52.0)
Hemoglobin: 12.6 g/dL — ABNORMAL LOW (ref 13.0–17.0)
MCH: 30.7 pg (ref 26.0–34.0)
MCHC: 34.1 g/dL (ref 30.0–36.0)
MCV: 90 fL (ref 80.0–100.0)
Platelets: 126 10*3/uL — ABNORMAL LOW (ref 150–400)
RBC: 4.11 MIL/uL — ABNORMAL LOW (ref 4.22–5.81)
RDW: 13.7 % (ref 11.5–15.5)
WBC: 10.2 10*3/uL (ref 4.0–10.5)
nRBC: 0 % (ref 0.0–0.2)

## 2023-08-18 LAB — BASIC METABOLIC PANEL WITH GFR
Anion gap: 7 (ref 5–15)
BUN: 10 mg/dL (ref 8–23)
CO2: 23 mmol/L (ref 22–32)
Calcium: 8.9 mg/dL (ref 8.9–10.3)
Chloride: 107 mmol/L (ref 98–111)
Creatinine, Ser: 0.8 mg/dL (ref 0.61–1.24)
GFR, Estimated: >60 mL/min (ref >60)
Glucose, Bld: 136 mg/dL — ABNORMAL HIGH (ref 70–99)
Potassium: 4.3 mmol/L (ref 3.5–5.1)
Sodium: 137 mmol/L (ref 135–145)

## 2023-08-18 LAB — MAGNESIUM: Magnesium: 1.9 mg/dL (ref 1.7–2.4)

## 2023-08-18 LAB — POCT ACTIVATED CLOTTING TIME
Activated Clotting Time: 198 s
Activated Clotting Time: 250 s

## 2023-08-18 LAB — LIPID PANEL
Cholesterol: 96 mg/dL (ref 0–200)
HDL: 38 mg/dL — ABNORMAL LOW (ref >40)
LDL Cholesterol: 40 mg/dL (ref 0–99)
Total CHOL/HDL Ratio: 2.5 ratio
Triglycerides: 90 mg/dL (ref <150)
VLDL: 18 mg/dL (ref 0–40)

## 2023-08-18 LAB — GLUCOSE, CAPILLARY: Glucose-Capillary: 125 mg/dL — ABNORMAL HIGH (ref 70–99)

## 2023-08-18 MED ORDER — ISOSORBIDE MONONITRATE ER 30 MG PO TB24
30.0000 mg | ORAL_TABLET | Freq: Every day | ORAL | 0 refills | Status: DC
Start: 2023-08-25 — End: 2023-09-20

## 2023-08-18 NOTE — TOC Transition Note (Signed)
 Transition of Care (TOC) - Discharge Note Donn Pierini RN, BSN Transitions of Care Unit 4E- RN Case Manager See Treatment Team for direct phone #   Patient Details  Name: Kevin Webb MRN: [id number] Date of Birth: 11/19/1945  [facility]) CM/SW Contact:  Darrold Span, RN Phone Number: 08/18/2023, 9:47 AM   Clinical Narrative:    Pt stable for transition home today s/p TCAR. No HH or DME needs noted.  Family to transport home.   No TOC needs or interventions noted for discharge.    Final next level of care: Home/Self Care Barriers to Discharge: Barriers Resolved   Patient Goals and CMS Choice Patient states their goals for this hospitalization and ongoing recovery are:: return home   Choice offered to / list presented to : NA      Discharge Placement               Home        Discharge Plan and Services Additional resources added to the After Visit Summary for   In-house Referral: NA Discharge Planning Services: NA Post Acute Care Choice: NA          DME Arranged: N/A DME Agency: NA       HH Arranged: NA HH Agency: NA        Social Drivers of Health (SDOH) Interventions SDOH Screenings   Food Insecurity: No Food Insecurity (08/14/2023)  Housing: Low Risk  (08/14/2023)  Transportation Needs: No Transportation Needs (08/14/2023)  Utilities: Not At Risk (08/14/2023)  Alcohol Screen: Low Risk  (12/16/2022)  Depression (PHQ2-9): Low Risk  (05/06/2023)  Financial Resource Strain: Low Risk  (12/16/2022)  Physical Activity: Inactive (12/16/2022)  Social Connections: Socially Integrated (08/14/2023)  Stress: No Stress Concern Present (12/16/2022)  Tobacco Use: Low Risk  (08/17/2023)  Health Literacy: Adequate Health Literacy (12/16/2022)     Readmission Risk Interventions    08/18/2023    9:47 AM  Readmission Risk Prevention Plan  Post Dischage Appt Complete  Medication Screening Complete  Transportation Screening Complete

## 2023-08-18 NOTE — Progress Notes (Addendum)
 [facility]  Subjective  - Doing well, no new complaints no neurologic deficits.   Objective 112/64 (!) 48 97.6 F (36.4 C) (Oral) 17 99%  Intake/Output Summary (Last 24 hours) at 08/18/2023 0858 Last data filed at 08/18/2023 0840 Gross per 24 hour  Intake 2448.13 ml  Output 2310 ml  Net 138.13 ml    Right neck incision healing well minimal ecchymosis Left groin healing well No tongue deviation or facial droop, no neurologic deficits Lungs non labored breathing   Assessment/Planning: POD # 1 TCAR for symptomatic carotid stenosis with blurry vision and issues with coordination.  MRI brain demonstrated acute infarcts in the right hemisphere.  Tol PO's, no new neurologic events, ambulated.  Stable for discharge from a vascular point of view.    Kevin Webb 08/18/2023 8:58 AM --  Laboratory Lab Results: Recent Labs    08/17/23 0553 08/18/23 0342  WBC 5.8 10.2  HGB 14.8 12.6*  HCT 42.1 37.0*  PLT 136* 126*   BMET Recent Labs    08/16/23 0646 08/18/23 0342  NA 139 137  K 3.9 4.3  CL 106 107  CO2 23 23  GLUCOSE 124* 136*  BUN 9 10  CREATININE 0.90 0.80  CALCIUM 9.2 8.9    COAG Lab Results  Component Value Date   INR 1.1 01/19/2020   INR 1.19 12/26/2016   INR 1.22 12/25/2016   No results found for: "PTT"   VASCULAR STAFF ADDENDUM: I have independently interviewed and examined the patient. I agree with the above.  Recovering well from R TCAR. Okay for discharge today. Continue aspirin and plavix.  Will have f/u in 4 weeks with a duplex  [name] MD [facility] Phone Number: [phone] 08/18/2023 9:27 AM

## 2023-08-18 NOTE — Progress Notes (Signed)
 Discharge instructions provided to the patient and his family, PIV removed, CCMD notified, all the questions answered, belongings taken by the family.

## 2023-08-18 NOTE — Progress Notes (Signed)
 PHARMACIST LIPID MONITORING   Kevin Webb is a 78 y.o. male admitted on 08/13/2023 with symptomatic Right carotid artery stenosis .  Pharmacy has been consulted to optimize lipid-lowering therapy with the indication of secondary prevention for clinical ASCVD.  Recent Labs:  Lipid Panel (last 6 months):   Lab Results  Component Value Date   CHOL 96 08/18/2023   TRIG 90 08/18/2023   HDL 38 (L) 08/18/2023   CHOLHDL 2.5 08/18/2023   VLDL 18 08/18/2023   LDLCALC 40 08/18/2023    Hepatic function panel (last 6 months):   No results found for: "AST", "ALT", "ALKPHOS", "BILITOT", "BILIDIR", "IBILI"  SCr (since admission):   Serum creatinine: 0.8 mg/dL 65/78/46 9629 Estimated creatinine clearance: 87.8 mL/min  Current therapy and lipid therapy tolerance Current lipid-lowering therapy: zetia / crestor Previous lipid-lowering therapies (if applicable): lipitor Documented or reported allergies or intolerances to lipid-lowering therapies (if applicable): none  Assessment:   Patient prefers no changes in lipid-lowering therapy at this time due to LDL 40 on zetia and crestor  Plan:    1.Statin intensity (high intensity recommended for all patients regardless of the LDL):  No statin changes. The patient is already on a high intensity statin.  2.Add ezetimibe (if any one of the following):  on zetia PTA  3.Refer to lipid clinic:   No  4.Follow-up with:  Primary care provider - Dana Allan, MD  5.Follow-up labs after discharge:  No changes in lipid therapy, repeat a lipid panel in one year.       Greta Doom BS, PharmD, BCPS Clinical Pharmacist 08/18/2023 8:39 AM  Contact: [phone] after 3 PM  "Be curious, not judgmental..." -Debbora Dus

## 2023-08-18 NOTE — Plan of Care (Signed)
  Problem: Clinical Measurements: Goal: Ability to maintain clinical measurements within normal limits will improve Outcome: Progressing Goal: Cardiovascular complication will be avoided Outcome: Progressing   Problem: Metabolic: Goal: Ability to maintain appropriate glucose levels will improve Outcome: Progressing   Problem: Education: Goal: Knowledge of disease or condition will improve Outcome: Progressing Goal: Knowledge of secondary prevention will improve (MUST DOCUMENT ALL) Outcome: Progressing   Problem: Ischemic Stroke/TIA Tissue Perfusion: Goal: Complications of ischemic stroke/TIA will be minimized Outcome: Progressing   Problem: Education: Goal: Knowledge of discharge needs will improve Outcome: Progressing   Problem: Clinical Measurements: Goal: Postoperative complications will be avoided or minimized Outcome: Progressing   Problem: Respiratory: Goal: Will achieve and/or maintain a regular respiratory rate, without signs or symptoms of dyspnea Outcome: Progressing   Problem: Skin Integrity: Goal: Demonstration of wound healing without infection will improve Outcome: Progressing

## 2023-08-18 NOTE — Discharge Summary (Signed)
 Physician Discharge Summary  Kevin Webb ZOX:[id number] DOB: [birth year] DOA: 08/13/2023  PCP: Dana Allan, MD  Admit date: 08/13/2023 Discharge date: 08/18/23  Admitted From: Home Disposition: Home Recommendations for Outpatient Follow-up:  Outpatient follow-up with PCP, vascular surgery and neurology as below Recommended outpatient follow-up with cardiology in 2 to 3 weeks BP meds on hold.  Check BP, CMP and CBC at follow-up.  Restart BP meds as appropriate Please follow up on the following pending results: None  Home Health: No need identified Equipment/Devices: No need identified  Discharge Condition: Stable CODE STATUS: Full code  Follow-up Information     [facility]. Schedule an appointment as soon as possible for a visit in 1 month(s).   Specialty: Neurology Why: stroke clinic Contact information: [address] [place] [address] [phone]        [facility] at [facility] Follow up in 4 week(s).   Specialty: Vascular Surgery Why: Office will call you to arrange your appt (sent). Contact information: [address] [place] [address] [phone]        Dana Allan, MD. Schedule an appointment as soon as possible for a visit in 1 week(s).   Specialty: Family Medicine Contact information: [address] [place] [address] [phone]                 Hospital course 78 year old M with PMH of prior CVA, CAD/CABG in 2000 and stent in 2018, prediabetes, HTN, HLD, GERD, BPH and alcohol use disorder presented to ED out of concern for stroke.  Had difficulty picking up the razor with his right hand on 3/27 that lasted few minutes and resolved.  Eventually able to grasp the razor and shave.  And had some blurry vision and incoordination with mobility at work that has resolved after he took a nap.  He has not had further symptoms but concerned  after he read about stroke and decided to come to ED.  Patient is on Plavix and aspirin with good compliance.  Drinks 3-4 beers and 4 ounces of whiskey a day.  Does not smoke.   In ED, bradycardic to 40s and 50s.  BMP and CBC without significant finding.  Serial troponin negative.  EKG showed sinus bradycardia without acute ischemic changes.  CXR and CT head without acute finding.  MRI brain showed a 10 mm focus of acute infarct in the right centrum semiovale, additional punctate foci of acute infarct involving the right precentral gyrus near the hand knob as well as posterior right parietal cortex and lateral right temporal cortex.  CT angio head and neck showed 65% right cervical ICA stenosis and 60% left cervical ICA stenosis.  Neurology consulted and recommended complete CVA workup and vascular surgery consult.   TTE without significant finding.  Remains asymptomatic.  Patient underwent right TCAR by Dr. Hetty Blend on 08/17/2023.  No postoperative complication.  Cleared for discharge the next day.  Patient has been advised to hold antihypertensive meds unless his blood pressure is over 130/80.  He knows on how to check his blood pressure.  He will contact his PCP or cardiology if blood pressures are elevated.  See individual problem list below for more.   Problems addressed during this hospitalization Acute CVA: Patient had brief difficulty using his right hand followed by blurry vision and gait disturbance on on 3/27.  MRI finding does not explain right hand issue or blurry vision.  CT angio head  and neck with 65% right cervical ICA stenosis and 60% left cervical ICA stenosis.  He admits to occasional lightheadedness when he stands up raising concern for presyncope.  Reports good compliance with Plavix and aspirin.  TTE without significant finding.  LDL is only 42.  A1c 6.0%.  No history of A-fib.  Remains on symptomatic. -S/p right TCAR on/05/2023 by Dr. Hetty Blend -Continue Plavix, aspirin and  statin -PT/OT/SLP-no need identified.   Bilateral cervical ICA stenosis -S/p right TCAR by Dr. Hetty Blend on 4/1 -Plavix, aspirin and statin as above   CAD s/p CABG in 2000 and graft stent in 2018.  LHC in 2021 with severe three-vessel occlusive CAD.  He is on Imdur, Plavix, aspirin, Toprol-XL, ramipril, Crestor and Zetia.  Normotensive off home antihypertensive meds.  Also bradycardic -Continue Plavix, aspirin, Crestor and Zetia -Advised to hold ramipril and Imdur -Discontinued metoprolol due to bradycardia. -Discontinued amlodipine   Sinus bradycardia: Likely due to Toprol-XL.  EKG sinus bradycardia to 56 with chronic LAD and RBBB.  TSH normal.  Heart rate dropped to 40s after TCAR.  Normotensive off home antihypertensive meds. -Discontinued home Toprol-XL -Reassess at follow-up.   Chronic chest pain: Likely musculoskeletal.  Usually when he wake In the Morning.  Serial Troponin negative.  No acute ischemic finding on EKG.  CXR without acute finding. -Tylenol as needed   Alcohol use disorder: Reports drinking 3-4 beers and about 4 ounces of whiskey a day.  No withdrawal symptoms. -Encouraged cessation/moderation.  He is determined to quit. -Continue thiamine, folic acid and multivitamin   Essential hypertension: Normotensive off home antihypertensive meds. -Antihypertensive meds as above.   Prediabetes: A1c 6.0%.  Not on meds at home.  Hyperlipidemia: LDL 42 -Continue Crestor and Zetia   BPH without LUTS -Continue Flomax   GERD -Continue PPI             Time spent 35 minutes  Vital signs Vitals:   08/18/23 0404 08/18/23 0420 08/18/23 0521 08/18/23 0829  BP: 137/74 (!) 109/44 104/60 112/64  Pulse: 79 (!) 53 (!) 44 (!) 48  Temp: 98.4 F (36.9 C) 97.6 F (36.4 C) 97.7 F (36.5 C) 97.6 F (36.4 C)  Resp: 17 17 16 17   Height:      Weight:      SpO2: 96% 93% 95% 99%  TempSrc: Oral Oral Oral Oral  BMI (Calculated):         Discharge exam  GENERAL: No apparent  distress.  Nontoxic. HEENT: MMM.  Vision and hearing grossly intact.  NECK: Supple.  No apparent JVD.  RESP:  No IWOB.  Fair aeration bilaterally. CVS:  RRR. Heart sounds normal.  ABD/GI/GU: BS+. Abd soft, NTND.  MSK/EXT:  Moves extremities. No apparent deformity. No edema.  SKIN: no apparent skin lesion or wound NEURO: Awake and alert. Oriented appropriately.  No apparent focal neuro deficit. PSYCH: Calm. Normal affect.   Discharge Instructions Discharge Instructions     Ambulatory referral to Neurology   Complete by: As directed    Follow up with stroke clinic NP at [facility] in about 4-6 weeks. Thanks.   Diet - low sodium heart healthy   Complete by: As directed    Discharge instructions   Complete by: As directed    It has been a pleasure taking care of you!  You were hospitalized due to stroke and carotid artery stenosis (narrowing of blood vessels in your neck) for which you have been treated surgically.  Continue taking your Plavix, aspirin, Crestor  and Zetia.  Your heart rate and your blood pressure is on the lower side.  We have stopped your metoprolol due to low heart rate.  We recommend holding your other blood pressure medications.  Please review your new medication list and the directions on your medications before you take them.  Monitor your blood pressure as we discussed and keep blood pressure log.  Notify your cardiologist or primary care doctor if your blood pressure is elevated, persistently greater than 130/80.  Follow-up with your primary care doctor in 1 to 2 weeks or sooner if needed.  Also follow-up with your cardiologist in 2 to 3 weeks.  Follow-up with neurology and vascular surgery per their recommendation.  We strongly recommend alcohol moderation or cessation.  It is not recommended to drink more than 2 beers or its equivalent in  a day.   Take care,   Increase activity slowly   Complete by: As directed       Allergies as of 08/18/2023       Reactions    Darvocet [propoxyphene N-acetaminophen] Hives   Propoxyphene Hives        Medication List     PAUSE taking these medications    ramipril 10 MG capsule Wait to take this until: August 25, 2023 Commonly known as: ALTACE Take 1 capsule (10 mg total) by mouth daily.       STOP taking these medications    amLODipine 5 MG tablet Commonly known as: NORVASC   celecoxib 50 MG capsule Commonly known as: CeleBREX   metoprolol succinate 25 MG 24 hr tablet Commonly known as: TOPROL-XL       TAKE these medications    aspirin EC 81 MG tablet Take 81 mg by mouth daily at 2 am. Swallow whole.   cholecalciferol 25 MCG (1000 UNIT) tablet Commonly known as: VITAMIN D3 Take 1,000 Units by mouth daily at 2 am.   clopidogrel 75 MG tablet Commonly known as: PLAVIX TAKE 1 TABLET BY MOUTH DAILY   CO Q-10 PO Take 1 capsule by mouth daily at 2 am.   dicyclomine 10 MG capsule Commonly known as: BENTYL Take 1 capsule (10 mg total) by mouth 3 (three) times daily before meals.   dutasteride 0.5 MG capsule Commonly known as: AVODART Take 1 capsule (0.5 mg total) by mouth daily.   ezetimibe 10 MG tablet Commonly known as: ZETIA TAKE 1 TABLET BY MOUTH DAILY   Fish Oil 1000 MG Caps Take 2,000 mg by mouth daily at 2 am.   GINKGO BILOBA PO Take 1 capsule by mouth daily at 2 am.   isosorbide mononitrate 30 MG 24 hr tablet Commonly known as: IMDUR Take 1 tablet (30 mg total) by mouth daily. Start taking on: August 25, 2023 What changed:  medication strength how much to take These instructions start on August 25, 2023. If you are unsure what to do until then, ask your doctor or other care provider.   MAGNESIUM PO Take 1 tablet by mouth daily at 2 am.   multivitamin with minerals Tabs tablet Take 1 tablet by mouth daily at 2 am.   niacin 250 MG tablet Commonly known as: VITAMIN B3 Take 250 mg by mouth daily.   nitroGLYCERIN 0.4 MG SL tablet Commonly known as: NITROSTAT Place 1  tablet (0.4 mg total) under the tongue every 5 (five) minutes x 3 doses as needed.   pantoprazole 40 MG tablet Commonly known as: PROTONIX Take 1 tablet (40 mg total) by  mouth daily.   rosuvastatin 40 MG tablet Commonly known as: CRESTOR TAKE 1 TABLET AT BEDTIME   tamsulosin 0.4 MG Caps capsule Commonly known as: FLOMAX Take 1 capsule (0.4 mg total) by mouth daily at 2 am.   vitamin C 1000 MG tablet Take 1,000 mg by mouth daily at 2 am. Reported on 09/26/2015   vitamin E 45 MG (100 UNITS) capsule Take 100 Units by mouth daily at 2 am.        Consultations: Neurology Vascular surgery  Procedures/Studies: Right TCAR by Dr. Hetty Blend on 08/17/2023   DG C-Arm 1-60 Min Result Date: 08/17/2023 CLINICAL DATA:  History of right transcarotid artery revascularization. Recent CTA head and neck demonstrating moderate stenosis of the right cervical ICA origin with approximately 65% narrowing. EXAM: DG C-ARM 1-60 MIN FLUOROSCOPY: Fluoroscopy Time:  3 minutes and 51 seconds Radiation Exposure Index (if provided by the fluoroscopic device): 51.15 Number of Acquired Spot Images: 8 fluoroscopic sequences obtained COMPARISON:  CTA head and neck 08/13/2023 FINDINGS: Fluoroscopic images of the right carotid artery which demonstrates stenosis on pre intervention images. The degree of stenosis is subjectively similar to recent CTA head and neck. Microcatheter noted traversing the region of stenosis extending into the mid cervical ICA. Stent deployment noted within the proximal cervical ICA. Stent is patent. Common carotid artery is patent proximal to the stent. Patent cervical ICA distal to the stent. External carotid artery branches appear patent. IMPRESSION: Successful right carotid stent placement (TCAR). See separately dictated procedure note for further details. Electronically Signed   By: Emily Filbert M.D.   On: 08/17/2023 15:54   ECHOCARDIOGRAM COMPLETE Result Date: 08/14/2023    ECHOCARDIOGRAM REPORT    Patient Name:   DONYELL DING Date of Exam: 08/14/2023 Medical Rec #:  [id number]      Height:       69.0 in Accession #:    [id number]     Weight:       197.3 lb Date of Birth:  [birth year]      BSA:          2.054 m Patient Age:    77 years       BP:           136/84 mmHg Patient Gender: M              HR:           55 bpm. Exam Location:  Inpatient Procedure: 2D Echo, Cardiac Doppler, Color Doppler and Intracardiac            Opacification Agent (Both Spectral and Color Flow Doppler were            utilized during procedure). Indications:    Stroke I63.9  History:        Patient has no prior history of Echocardiogram examinations.                 Angina and CAD, Stroke, Arrythmias:Bradycardia,                 Signs/Symptoms:Chest Pain; Risk Factors:Hypertension, Diabetes                 and Dyslipidemia.  Sonographer:    Lucendia Herrlich RCS Referring Phys: [id number] VASUNDHRA RATHORE IMPRESSIONS  1. Left ventricular ejection fraction, by estimation, is 60 to 65%. The left ventricle has normal function. The left ventricle has no regional wall motion abnormalities. Left ventricular diastolic parameters are indeterminate.  2. Right ventricular systolic function  is low normal. The right ventricular size is Dilated.  3. Left atrial size was mildly dilated.  4. The mitral valve is normal in structure. No evidence of mitral valve regurgitation.  5. The aortic valve is tricuspid. Aortic valve regurgitation is mild. No aortic stenosis is present.  6. The inferior vena cava is normal in size with greater than 50% respiratory variability, suggesting right atrial pressure of 3 mmHg. Comparison(s): No prior Echocardiogram. FINDINGS  Left Ventricle: No ventricular strain or 3D. Left ventricular ejection fraction, by estimation, is 60 to 65%. The left ventricle has normal function. The left ventricle has no regional wall motion abnormalities. Definity contrast agent was given IV to delineate the left ventricular endocardial  borders. Strain was performed and the global longitudinal strain is indeterminate. The left ventricular internal cavity size was normal in size. There is no left ventricular hypertrophy. Left ventricular diastolic parameters are indeterminate. Right Ventricle: The right ventricular size is Dilated. No increase in right ventricular wall thickness. Right ventricular systolic function is low normal. Left Atrium: Left atrial size was mildly dilated. Right Atrium: Right atrial size was normal in size. Pericardium: There is no evidence of pericardial effusion. Mitral Valve: The mitral valve is normal in structure. No evidence of mitral valve regurgitation. Tricuspid Valve: The tricuspid valve is normal in structure. Tricuspid valve regurgitation is mild . No evidence of tricuspid stenosis. Aortic Valve: The aortic valve is tricuspid. Aortic valve regurgitation is mild. Aortic regurgitation PHT measures 564 msec. No aortic stenosis is present. Aortic valve peak gradient measures 9.5 mmHg. Pulmonic Valve: The pulmonic valve was normal in structure. Pulmonic valve regurgitation is not visualized. No evidence of pulmonic stenosis. Aorta: The aortic root and ascending aorta are structurally normal, with no evidence of dilitation. Venous: The inferior vena cava is normal in size with greater than 50% respiratory variability, suggesting right atrial pressure of 3 mmHg. IAS/Shunts: The interatrial septum was not well visualized. Additional Comments: 3D was performed not requiring image post processing on an independent workstation and was indeterminate.  LEFT VENTRICLE PLAX 2D LVIDd:         5.60 cm   Diastology LVIDs:         3.60 cm   LV e' medial:    7.40 cm/s LV PW:         1.10 cm   LV E/e' medial:  9.9 LV IVS:        0.90 cm   LV e' lateral:   13.70 cm/s LVOT diam:     2.10 cm   LV E/e' lateral: 5.3 LV SV:         82 LV SV Index:   40 LVOT Area:     3.46 cm  RIGHT VENTRICLE            IVC RV S prime:     7.94 cm/s  IVC  diam: 1.20 cm LEFT ATRIUM             Index        RIGHT ATRIUM           Index LA diam:        4.40 cm 2.14 cm/m   RA Area:     15.30 cm LA Vol (A2C):   71.3 ml 34.71 ml/m  RA Volume:   37.10 ml  18.06 ml/m LA Vol (A4C):   82.8 ml 40.31 ml/m LA Biplane Vol: 82.3 ml 40.07 ml/m  AORTIC VALVE AV Area (Vmax): 2.20 cm AV  Vmax:        154.00 cm/s AV Peak Grad:   9.5 mmHg LVOT Vmax:      97.73 cm/s LVOT Vmean:     68.033 cm/s LVOT VTI:       0.236 m AI PHT:         564 msec  AORTA Ao Root diam: 3.80 cm Ao Asc diam:  3.90 cm MITRAL VALVE               TRICUSPID VALVE MV Area (PHT): 3.37 cm    TR Peak grad:   22.1 mmHg MV Decel Time: 225 msec    TR Vmax:        235.00 cm/s MR Peak grad: 28.3 mmHg MR Vmax:      266.00 cm/s  SHUNTS MV E velocity: 73.20 cm/s  Systemic VTI:  0.24 m MV A velocity: 80.20 cm/s  Systemic Diam: 2.10 cm MV E/A ratio:  0.91 Riley Lam MD Electronically signed by Riley Lam MD Signature Date/Time: 08/14/2023/1:07:45 PM    Final    MR BRAIN WO CONTRAST Result Date: 08/13/2023 CLINICAL DATA:  Neuro deficit, weakness notice yesterday, concern for stroke. EXAM: MRI HEAD WITHOUT CONTRAST TECHNIQUE: Multiplanar, multiecho pulse sequences of the brain and surrounding structures were obtained without intravenous contrast. COMPARISON:  CTA head and neck earlier same day. MRI head 01/19/2020. FINDINGS: Brain: There is a 10 mm focus of restricted diffusion in the right centrum semiovale compatible with acute infarct. Additional punctate foci of acute infarct involving the right precentral gyrus in the region of the hand knob, posterior parasagittal right parietal cortex, and lateral right temporal cortex. Curvilinear susceptibility along the left parietal cortex suggestive of remote hemorrhage. No associated edema or mass effect. No findings to suggest acute intracranial hemorrhage. Scattered signal abnormality in the periventricular and subcortical white matter. Remote lacunar  infarcts in the bilateral centrum semiovale. Encephalomalacia and gliosis in the left middle frontal gyrus compatible with remote infarct. Additional small remote right parietal cortical infarct. Remote lacunar infarct in the left thalamus. Chronic microhemorrhage in the left cerebellum. Normal appearance of midline structures. The basilar cisterns are patent. No extra-axial fluid collections. Ventricles: Prominence of the lateral ventricles suggestive of underlying parenchymal volume loss. Vascular: Skull base flow voids are visualized. Skull and upper cervical spine: No focal abnormality. Sinuses/Orbits: Mucosal thickening in the ethmoid and maxillary sinuses. Other: Mastoid air cells are clear. IMPRESSION: 10 mm focus of acute infarct in the right centrum semiovale. Additional punctate foci of acute infarct involving the right precentral gyrus near the hand knob as well as the posterior right parietal cortex and lateral right temporal cortex. Nonspecific white matter signal abnormality likely reflecting mild chronic microvascular ischemic changes. Mild parenchymal volume loss. Multiple remote infarcts as above. Susceptibility over the left parietal cortex is new since 2021 suggestive of remote hemorrhage. Chronic microhemorrhage again noted in the left cerebellum. Electronically Signed   By: Emily Filbert M.D.   On: 08/13/2023 18:52   CT ANGIO HEAD NECK W WO CM Result Date: 08/13/2023 CLINICAL DATA:  Neuro deficit, concern for stroke, episodes of right hand weakness yesterday. EXAM: CT ANGIOGRAPHY HEAD AND NECK WITH AND WITHOUT CONTRAST TECHNIQUE: Multidetector CT imaging of the head and neck was performed using the standard protocol during bolus administration of intravenous contrast. Multiplanar CT image reconstructions and MIPs were obtained to evaluate the vascular anatomy. Carotid stenosis measurements (when applicable) are obtained utilizing NASCET criteria, using the distal internal carotid diameter as  the  denominator. RADIATION DOSE REDUCTION: This exam was performed according to the departmental dose-optimization program which includes automated exposure control, adjustment of the mA and/or kV according to patient size and/or use of iterative reconstruction technique. CONTRAST:  75mL OMNIPAQUE IOHEXOL 350 MG/ML SOLN COMPARISON:  CTA head and neck 01/19/2020. FINDINGS: CT HEAD FINDINGS Brain: No acute intracranial hemorrhage. No CT evidence of acute infarct. Nonspecific hypoattenuation in the periventricular and subcortical white matter favored to reflect chronic microvascular ischemic changes. Redemonstrated remote lacunar infarct in the left thalamus. No edema, mass effect, or midline shift. The basilar cisterns are patent. Ventricles: Ventricles are normal in size and configuration. Vascular: No hyperdense vessel. Redemonstrated atherosclerosis involving the carotid siphons and intracranial vertebral arteries. Additional focal atherosclerosis along the left M1 segment. Skull: No acute or aggressive finding. Sinuses/orbits: Orbits are symmetric. Mucosal thickening and possible mucous retention cyst in the left maxillary sinus. Other: Mastoid air cells are clear. CTA NECK FINDINGS Aortic arch: Standard configuration of the aortic arch. Imaged portion shows no evidence of aneurysm or dissection. Mild atherosclerosis of the aortic arch. Sequelae of CABG. No significant stenosis of the major arch vessel origins. Pulmonary arteries: As permitted by contrast timing, there are no filling defects in the visualized pulmonary arteries. Subclavian arteries: The subclavian arteries are patent bilaterally. Right carotid system: Patent. Mild atherosclerosis along the common carotid artery. Bulky calcified atherosclerosis an additional noncalcified atherosclerotic plaque involving the carotid bifurcation extending into the carotid bulb. There is approximately 65% stenosis at the origin of the right cervical ICA. External  carotid artery branches are patent. No evidence of dissection. Left carotid system: Patent. Mild atherosclerosis along the common carotid artery. Bulky calcified atherosclerosis with additional component of noncalcified atherosclerotic plaque at the carotid bifurcation extending into the carotid bulb. Approximately 60% stenosis at the origin of the left cervical ICA. Left EAC branches are patent. Vertebral arteries: Codominant. Patent from the origins to the vertebrobasilar confluence. Atherosclerosis involving the vertebral artery origins resulting in moderate stenosis on the right and severe stenosis on the left. Additional atherosclerosis of the left V3 segment and bilateral V4 segments. There is moderate focal stenosis of the proximal right V4 segment and mild stenosis of the left V4 segment. No evidence of dissection, stenosis (50% or greater), or occlusion. Skeleton: No acute findings. Degenerative changes in the cervical spine. Sequelae of median sternotomy. Other neck: The visualized airway is patent. No cervical lymphadenopathy. Upper chest: Visualized lung apices are clear. Review of the MIP images confirms the above findings CTA HEAD FINDINGS ANTERIOR CIRCULATION: The intracranial ICAs are patent bilaterally. Atherosclerosis of the bilateral carotid siphons. Mild stenosis of the bilateral cavernous ICAs. Moderate stenosis of the bilateral supraclinoid ICAs slightly greater on the left. No proximal occlusion, aneurysm, or vascular malformation. MCAs: The middle cerebral arteries are patent bilaterally. ACAs: The anterior cerebral arteries are patent bilaterally. POSTERIOR CIRCULATION: No significant stenosis, proximal occlusion, aneurysm, or vascular malformation. PCAs: The posterior cerebral arteries are patent bilaterally. Pcomm: Not well visualized. SCAs: The superior cerebellar arteries are patent bilaterally. Basilar artery: Patent AICAs: Visualized on the left. PICAs: Visualized bilaterally more  pronounced on the right. Vertebral arteries: The intracranial vertebral arteries are patent. Venous sinuses: As permitted by contrast timing, patent. Anatomic variants: None Review of the MIP images confirms the above findings IMPRESSION: No large vessel occlusion. No CT evidence of acute intracranial abnormality. Focal moderate stenosis of the V4 segment right vertebral artery. Moderate stenosis of the bilateral supraclinoid ICAs. Atherosclerosis in the neck as above.  Approximately 65% stenosis at the origin of the right cervical ICA and 60% stenosis at the origin of the left cervical ICA. Stenosis at the origins of the vertebral arteries, severe on the left and moderate on the right. Remote lacunar infarct in the left thalamus. Electronically Signed   By: Emily Filbert M.D.   On: 08/13/2023 17:19   DG Chest 2 View Result Date: 08/13/2023 CLINICAL DATA:  Chest pain EXAM: CHEST - 2 VIEW COMPARISON:  Chest radiograph dated 12/30/2022 FINDINGS: Normal lung volumes. No focal consolidations. No pleural effusion or pneumothorax. Similar postsurgical cardiomediastinal silhouette. Median sternotomy wires are nondisplaced. IMPRESSION: No active cardiopulmonary disease. Electronically Signed   By: Agustin Cree M.D.   On: 08/13/2023 14:32       The results of significant diagnostics from this hospitalization (including imaging, microbiology, ancillary and laboratory) are listed below for reference.     Microbiology: Recent Results (from the past 240 hours)  Surgical pcr screen     Status: None   Collection Time: 08/16/23  6:23 PM   Specimen: Nasal Mucosa; Nasal Swab  Result Value Ref Range Status   MRSA, PCR NEGATIVE NEGATIVE Final   Staphylococcus aureus NEGATIVE NEGATIVE Final    Comment: (NOTE) The Xpert SA Assay (FDA approved for NASAL specimens in patients 78 years of age and older), is one component of a comprehensive surveillance program. It is not intended to diagnose infection nor to guide or  monitor treatment. Performed at [facility], [address] [place], [place] [address]      Labs:  CBC: Recent Labs  Lab 08/13/23 1306 08/15/23 [phone] 08/16/23 0646 08/17/23 0553 08/18/23 0342  WBC 6.3 4.7 5.6 5.8 10.2  HGB 14.0 15.0 14.8 14.8 12.6*  HCT 41.9 43.1 43.5 42.1 37.0*  MCV 91.1 89.4 88.8 89.8 90.0  PLT 151 141* 147* 136* 126*   BMP &GFR Recent Labs  Lab 08/13/23 1306 08/15/23 0629 08/16/23 0646 08/18/23 0342  NA 137 140 139 137  K 4.0 3.8 3.9 4.3  CL 103 105 106 107  CO2 27 23 23 23   GLUCOSE 169* 131* 124* 136*  BUN 10 9 9 10   CREATININE 0.86 0.81 0.90 0.80  CALCIUM 8.8* 9.2 9.2 8.9  MG  --  2.0 2.0 1.9  PHOS  --  4.4 4.1  --    Estimated Creatinine Clearance: 87.8 mL/min (by C-G formula based on SCr of 0.8 mg/dL). Liver & Pancreas: Recent Labs  Lab 08/15/23 0629  ALBUMIN 3.5   No results for input(s): "LIPASE", "AMYLASE" in the last 168 hours. No results for input(s): "AMMONIA" in the last 168 hours. Diabetic: No results for input(s): "HGBA1C" in the last 72 hours. Recent Labs  Lab 08/17/23 0605 08/17/23 0938 08/17/23 1526 08/17/23 2116 08/18/23 0608  GLUCAP 128* 126* 159* 212* 125*   Cardiac Enzymes: No results for input(s): "CKTOTAL", "CKMB", "CKMBINDEX", "TROPONINI" in the last 168 hours. No results for input(s): "PROBNP" in the last 8760 hours. Coagulation Profile: No results for input(s): "INR", "PROTIME" in the last 168 hours. Thyroid Function Tests: No results for input(s): "TSH", "T4TOTAL", "FREET4", "T3FREE", "THYROIDAB" in the last 72 hours. Lipid Profile: Recent Labs    08/18/23 0342  CHOL 96  HDL 38*  LDLCALC 40  TRIG 90  CHOLHDL 2.5   Anemia Panel: No results for input(s): "VITAMINB12", "FOLATE", "FERRITIN", "TIBC", "IRON", "RETICCTPCT" in the last 72 hours. Urine analysis:    Component Value Date/Time   COLORURINE YELLOW 05/05/2023 1201  APPEARANCEUR Clear 05/27/2023 1459   LABSPEC 1.010 05/05/2023  1201   PHURINE 7.5 05/05/2023 1201   GLUCOSEU Negative 05/27/2023 1459   GLUCOSEU NEGATIVE 05/05/2023 1201   HGBUR NEGATIVE 05/05/2023 1201   BILIRUBINUR Negative 05/27/2023 1459   KETONESUR NEGATIVE 05/05/2023 1201   PROTEINUR Negative 05/27/2023 1459   PROTEINUR NEGATIVE 11/11/2021 1004   UROBILINOGEN 0.2 05/05/2023 1201   NITRITE Negative 05/27/2023 1459   NITRITE NEGATIVE 05/05/2023 1201   LEUKOCYTESUR Negative 05/27/2023 1459   LEUKOCYTESUR NEGATIVE 05/05/2023 1201   Sepsis Labs: Invalid input(s): "PROCALCITONIN", "LACTICIDVEN"   SIGNED:  Almon Hercules, MD  [facility] 08/18/2023, 2:16 PM

## 2023-08-19 ENCOUNTER — Telehealth: Payer: Self-pay

## 2023-08-19 NOTE — Transitions of Care (Post Inpatient/ED Visit) (Signed)
 08/19/2023  Name: Kevin Webb MRN: [id number] DOB: 1945-09-02  Today's TOC FU Call Status: Today's TOC FU Call Status:: Successful TOC FU Call Completed TOC FU Call Complete Date: 08/19/23 Patient's Name and Date of Birth confirmed.  Transition Care Management Follow-up Telephone Call Date of Discharge: 08/18/23 Discharge Facility: [facility]) Type of Discharge: Inpatient Admission Primary Inpatient Discharge Diagnosis:: TCAR How have you been since you were released from the hospital?: Better (HR is running in the mid 40's) Any questions or concerns?: No (The patient will call the Cardiologist or Vascular Surgeon if his HR remains low. He states he is asymptomatic)  Items Reviewed: Did you receive and understand the discharge instructions provided?: Yes Medications obtained,verified, and reconciled?: Yes (Medications Reviewed) Any new allergies since your discharge?: No Dietary orders reviewed?: Yes Type of Diet Ordered:: Heart Healthy Do you have support at home?: Yes People in Home: spouse Name of Support/Comfort Primary Source: Sarina Ser  Medications Reviewed Today: Medications Reviewed Today     Reviewed by [facility], RN (Case Manager) on 08/19/23 at 1019  Med List Status: <None>   Medication Order Taking? Sig Documenting Provider Last Dose Status Informant  Ascorbic Acid (VITAMIN C) 1000 MG tablet [id number] No Take 1,000 mg by mouth daily at 2 am. Reported on 09/26/2015 [provider] Past Week Active Self  aspirin EC 81 MG tablet [id number] No Take 81 mg by mouth daily at 2 am. Swallow whole. [provider] 08/13/2023 Morning Active Self  cholecalciferol (VITAMIN D) 25 MCG (1000 UNIT) tablet [id number] No Take 1,000 Units by mouth daily at 2 am. [provider] Past Week Active Self  clopidogrel (PLAVIX) 75 MG tablet [id number] No TAKE 1 TABLET BY MOUTH DAILY Dana Allan, MD 08/13/2023 Morning Active Self  Coenzyme Q10 (CO Q-10 PO)  [id number] No Take 1 capsule by mouth daily at 2 am. [provider] Past Week Active Self  dicyclomine (BENTYL) 10 MG capsule [id number] No Take 1 capsule (10 mg total) by mouth 3 (three) times daily before meals. Meryl Dare, MD Past Week Active Self  dutasteride (AVODART) 0.5 MG capsule [id number] No Take 1 capsule (0.5 mg total) by mouth daily. Harle Battiest, PA-C 08/13/2023 Morning Active Self  ezetimibe (ZETIA) 10 MG tablet [id number] No TAKE 1 TABLET BY MOUTH DAILY Dana Allan, MD 08/13/2023 Morning Active Self  GINKGO BILOBA PO [id number] No Take 1 capsule by mouth daily at 2 am.  [provider] Past Week Active Self  isosorbide mononitrate (IMDUR) 30 MG 24 hr tablet [id number]  Take 1 tablet (30 mg total) by mouth daily. Almon Hercules, MD  Active   MAGNESIUM PO [id number] No Take 1 tablet by mouth daily at 2 am. [provider] Past Week Active Self  Multiple Vitamin (MULTIVITAMIN WITH MINERALS) TABS tablet [id number] No Take 1 tablet by mouth daily at 2 am. [provider] Past Week Active Self  niacin 250 MG tablet [id number] No Take 250 mg by mouth daily. [provider] Past Week Active Self  nitroGLYCERIN (NITROSTAT) 0.4 MG SL tablet [id number] No Place 1 tablet (0.4 mg total) under the tongue every 5 (five) minutes x 3 doses as needed. Rollene Rotunda, MD Unknown Active Self           Med Note Erin Fulling, [facility]   XFG Aug 14, 2023 10:36 AM) Patient has if needed.  Omega-3 Fatty Acids (FISH OIL) 1000 MG CAPS [id number] No Take 2,000 mg by mouth daily  at 2 am.  [provider] Past Week Active Self  pantoprazole (PROTONIX) 40 MG tablet [id number] No Take 1 tablet (40 mg total) by mouth daily. Meryl Dare, MD 08/13/2023 Morning Active Self  ramipril (ALTACE) 10 MG capsule [id number] No Take 1 capsule (10 mg total) by mouth daily. Dana Allan, MD 08/13/2023 Morning Active Self  rosuvastatin (CRESTOR) 40 MG tablet [id number] No TAKE 1  TABLET AT BEDTIME Dana Allan, MD 08/12/2023 Active Self  tamsulosin (FLOMAX) 0.4 MG CAPS capsule [id number] No Take 1 capsule (0.4 mg total) by mouth daily at 2 am. Harle Battiest, PA-C 08/12/2023 Active Self  vitamin E 100 UNIT capsule [id number] No Take 100 Units by mouth daily at 2 am.  [provider] Past Week Active Self            Home Care and Equipment/Supplies: Were Home Health Services Ordered?: NA Any new equipment or medical supplies ordered?: NA  Functional Questionnaire: Do you need assistance with bathing/showering or dressing?: No Do you need assistance with meal preparation?: No Do you need assistance with eating?: No Do you have difficulty maintaining continence: No Do you need assistance with getting out of bed/getting out of a chair/moving?: No Do you have difficulty managing or taking your medications?: No  Follow up appointments reviewed: PCP Follow-up appointment confirmed?: Yes Date of PCP follow-up appointment?: 08/23/23 Follow-up Provider: Dana Allan Specialist [facility] Follow-up appointment confirmed?: Yes Date of Specialist follow-up appointment?: 09/24/23 Follow-Up Specialty Provider:: [facility] surgical Do you need transportation to your follow-up appointment?: No Do you understand care options if your condition(s) worsen?: Yes-patient verbalized understanding  SDOH Interventions Today    Flowsheet Row Most Recent Value  SDOH Interventions   Food Insecurity Interventions Intervention Not Indicated  Housing Interventions Intervention Not Indicated  Transportation Interventions Intervention Not Indicated  Utilities Interventions Intervention Not Indicated      Interventions Today    Flowsheet Row Most Recent Value  Chronic Disease   Chronic disease during today's visit Hypertension (HTN)  General Interventions   General Interventions Discussed/Reviewed General Interventions Discussed, General Interventions Reviewed, Doctor  Visits  Doctor Visits Discussed/Reviewed Doctor Visits Reviewed  Exercise Interventions   Exercise Discussed/Reviewed Physical Activity  Physical Activity Discussed/Reviewed Physical Activity Reviewed  Education Interventions   Education Provided Provided Education  Provided Verbal Education On When to see the doctor, Medication, Exercise  Pharmacy Interventions   Pharmacy Dicussed/Reviewed Medications and their functions      The patient has been provided with contact information for the care management team and has been advised to call with any health-related questions or concerns. The patient verbalized understanding with current POC. The patient is directed to their insurance card regarding availability of benefits coverage.  Deidre Ala, BSN, RN   [facility] - [employer] [facility] RN Care Manager [phone]

## 2023-08-23 ENCOUNTER — Encounter: Payer: Self-pay | Admitting: Family Medicine

## 2023-08-23 ENCOUNTER — Ambulatory Visit: Admitting: Family Medicine

## 2023-08-23 VITALS — BP 127/74 | HR 59 | Temp 98.6°F | Resp 20 | Ht 69.0 in | Wt 198.5 lb

## 2023-08-23 DIAGNOSIS — Z8673 Personal history of transient ischemic attack (TIA), and cerebral infarction without residual deficits: Secondary | ICD-10-CM | POA: Diagnosis not present

## 2023-08-23 DIAGNOSIS — I152 Hypertension secondary to endocrine disorders: Secondary | ICD-10-CM

## 2023-08-23 DIAGNOSIS — N50812 Left testicular pain: Secondary | ICD-10-CM

## 2023-08-23 DIAGNOSIS — E1159 Type 2 diabetes mellitus with other circulatory complications: Secondary | ICD-10-CM | POA: Diagnosis not present

## 2023-08-23 DIAGNOSIS — E1169 Type 2 diabetes mellitus with other specified complication: Secondary | ICD-10-CM | POA: Diagnosis not present

## 2023-08-23 LAB — CBC WITH DIFFERENTIAL/PLATELET
Basophils Absolute: 0 10*3/uL (ref 0.0–0.1)
Basophils Relative: 0.1 % (ref 0.0–3.0)
Eosinophils Absolute: 0.1 10*3/uL (ref 0.0–0.7)
Eosinophils Relative: 0.8 % (ref 0.0–5.0)
HCT: 43.5 % (ref 39.0–52.0)
Hemoglobin: 14.6 g/dL (ref 13.0–17.0)
Lymphocytes Relative: 17.5 % (ref 12.0–46.0)
Lymphs Abs: 1.4 10*3/uL (ref 0.7–4.0)
MCHC: 33.7 g/dL (ref 30.0–36.0)
MCV: 91.6 fl (ref 78.0–100.0)
Monocytes Absolute: 0.7 10*3/uL (ref 0.1–1.0)
Monocytes Relative: 8.5 % (ref 3.0–12.0)
Neutro Abs: 5.8 10*3/uL (ref 1.4–7.7)
Neutrophils Relative %: 73.1 % (ref 43.0–77.0)
Platelets: 175 10*3/uL (ref 150.0–400.0)
RBC: 4.75 Mil/uL (ref 4.22–5.81)
RDW: 14.5 % (ref 11.5–15.5)
WBC: 7.9 10*3/uL (ref 4.0–10.5)

## 2023-08-23 LAB — COMPREHENSIVE METABOLIC PANEL WITH GFR
ALT: 21 U/L (ref 0–53)
AST: 21 U/L (ref 0–37)
Albumin: 4.6 g/dL (ref 3.5–5.2)
Alkaline Phosphatase: 56 U/L (ref 39–117)
BUN: 9 mg/dL (ref 6–23)
CO2: 27 meq/L (ref 19–32)
Calcium: 9.6 mg/dL (ref 8.4–10.5)
Chloride: 103 meq/L (ref 96–112)
Creatinine, Ser: 0.89 mg/dL (ref 0.40–1.50)
GFR: 83.72 mL/min (ref >60.00)
Glucose, Bld: 112 mg/dL — ABNORMAL HIGH (ref 70–99)
Potassium: 4.3 meq/L (ref 3.5–5.1)
Sodium: 137 meq/L (ref 135–145)
Total Bilirubin: 1.1 mg/dL (ref 0.2–1.2)
Total Protein: 7.2 g/dL (ref 6.0–8.3)

## 2023-08-23 NOTE — Patient Instructions (Addendum)
 It was a pleasure meeting you today. Thank you for allowing me to take part in your health care.  Our goals for today as we discussed include:  We will get some labs today.  If they are abnormal or we need to do something about them, I will call you.  If they are normal, I will send you a message on MyChart (if it is active) or a letter in the mail.  If you don't hear from [place] in 2 weeks, please call the office at the number below.   Follow up with Neurology  April 23 at 915 am.  Please arrive 845 am for check in  [address] [place] [phone]  Follow up with Vein and Vascular  August 21 [address] [facility] [place] [phone]  Follow up with Cardiology as scheduled  Monitor blood pressure Goal <130/80 Continue current medication Restart Imdur 30 mg on April 9  Hold Ramipril and Amlodipine for now.  Notify MD if Blood pressure persistently >130/80  Follow up in 4 weeks    Health Maintenance  Topic Date Due   Eye exam for diabetics  12/14/2021   Yearly kidney health urinalysis for diabetes  11/03/2023   Medicare Annual Wellness Visit  12/16/2023   Flu Shot  12/17/2023   Hemoglobin A1C  02/14/2024   Yearly kidney function blood test for diabetes  08/17/2024   Colon Cancer Screening  04/27/2026   DTaP/Tdap/Td vaccine (3 - Td or Tdap) 06/26/2030   Pneumonia Vaccine  Completed   Hepatitis C Screening  Completed   HPV Vaccine  Aged Out   Complete foot exam   Discontinued   COVID-19 Vaccine  Discontinued   Zoster (Shingles) Vaccine  Discontinued     If you have any questions or concerns, please do not hesitate to call the office at [phone].  I look forward to our next visit and until then take care and stay safe.  Regards,   Dana Allan, MD   [facility]

## 2023-08-23 NOTE — Progress Notes (Signed)
 SUBJECTIVE:   Chief Complaint  Patient presents with   Hospitalization Follow-up    Had 3 strokes   HPI Presents for hospital follow up  Discussed the use of AI scribe software for clinical note transcription with the patient, who gave verbal consent to proceed.  History of Present Illness Kevin Webb "Gene" is a 78 year old male with a history of strokes who presents for follow-up after recent stroke events.  He has experienced three strokes, with the most recent event involving extreme fatigue and a brief period of drowsiness while driving. No speech deficits, memory issues, weakness, or blurry vision were noted during these episodes. Despite feeling tired, he was able to perform daily activities such as driving and giving instructions to others.  Three years ago, he experienced right arm weakness, which led to a hospital visit where an MRI revealed a past stroke. More recently, he was admitted to the hospital after an MRI confirmed another stroke. During this admission, he underwent a procedure to place a stent in his right carotid artery due to a 65% occlusion.  He is currently on Plavix  and has paused certain blood pressure medications, including ramipril  and amlodipine , due to low blood pressure readings. His blood pressure has been low, around 105 mmHg, and he reports low energy levels. He is also taking Imdur  120 mg and has reduced the dose.  He mentions a history of elevated blood sugar levels during his hospital stay, where he received insulin . His A1c was previously recorded at 6.0, indicating prediabetes. He is managing his blood sugar with dietary changes and supplements like berberine.  He reports soreness in his left testicle, which has been evaluated by urology without a definitive diagnosis. He associates this discomfort with a recent procedure involving the femoral artery.  No current weakness, dizziness, or blurry vision. He reports low energy levels and occasional  soreness in the left testicle.    PERTINENT PMH / PSH: As above  OBJECTIVE:  BP 127/74   Pulse (!) 59   Temp 98.6 F (37 C)   Resp 20   Ht 5\' 9"  (1.753 m)   Wt 198 lb 8 oz (90 kg)   SpO2 95%   BMI 29.31 kg/m    Physical Exam Vitals reviewed.  Constitutional:      General: He is not in acute distress.    Appearance: Normal appearance. He is normal weight. He is not ill-appearing, toxic-appearing or diaphoretic.  Eyes:     General:        Right eye: No discharge.        Left eye: No discharge.  Cardiovascular:     Rate and Rhythm: Normal rate and regular rhythm.     Heart sounds: Normal heart sounds.  Pulmonary:     Effort: Pulmonary effort is normal.     Breath sounds: Normal breath sounds.  Abdominal:     General: Bowel sounds are normal.  Musculoskeletal:        General: Normal range of motion.     Cervical back: Normal range of motion.  Skin:    General: Skin is warm and dry.  Neurological:     Mental Status: He is alert and oriented to person, place, and time. Mental status is at baseline.     Cranial Nerves: Cranial nerves 2-12 are intact.     Sensory: Sensation is intact.     Motor: Motor function is intact.     Coordination: Coordination is intact.  Gait: Gait is intact.  Psychiatric:        Mood and Affect: Mood normal.        Behavior: Behavior normal.        Thought Content: Thought content normal.        Judgment: Judgment normal.           08/23/2023   11:17 AM 05/06/2023   11:35 AM 12/16/2022   10:40 AM 11/03/2022   10:34 AM 05/04/2022    1:00 PM  Depression screen PHQ 2/9  Decreased Interest 0 0 0 0 0  Down, Depressed, Hopeless 0 0 0 0 0  PHQ - 2 Score 0 0 0 0 0  Altered sleeping 2 0 0 0 0  Tired, decreased energy 0 0 0 0 0  Change in appetite 0 0 0 0 0  Feeling bad or failure about yourself  0 0 0 0 0  Trouble concentrating 0 0 0 0 0  Moving slowly or fidgety/restless 0 0 0 0 0  Suicidal thoughts 0 0  0 0  PHQ-9 Score 2 0 0 0 0   Difficult doing work/chores Not difficult at all Not difficult at all Not difficult at all Not difficult at all Not difficult at all      08/23/2023   11:17 AM 05/06/2023   11:35 AM 11/03/2022   10:34 AM 05/04/2022    1:01 PM  GAD 7 : Generalized Anxiety Score  Nervous, Anxious, on Edge 0 0 0 0  Control/stop worrying 0 0 0 0  Worry too much - different things 0 0 0 0  Trouble relaxing 0 0 0 0  Restless 0 0 0 0  Easily annoyed or irritable 0 0 0 0  Afraid - awful might happen 0 0 0 0  Total GAD 7 Score 0 0 0 0  Anxiety Difficulty Not difficult at all Not difficult at all Not difficult at all Not difficult at all    ASSESSMENT/PLAN:  Hypertension associated with diabetes [facility]) Assessment & Plan: Blood pressure low since medication adjustments. Current management includes holding certain antihypertensive medications. Target is less than 130/80 mmHg. Plan to restart ramipril  if blood pressure rises. - Monitor blood pressure regularly. - Restart ramipril  if blood pressure rises. - Follow up with cardiologist if issues arise. - Check Cmet, CBC today  Orders: -     Comprehensive metabolic panel with GFR -     CBC with Differential/Platelet  History of CVA (cerebrovascular accident) without residual deficits Assessment & Plan: Three strokes with no significant deficits. Recent stroke confirmed by MRI. Carotid artery stenosis with 65% occlusion led to stent placement. No current neurological deficits. - Lifestyle modifications to manage diabetes and overall health.  - Avoid/Limit alcohol intake - Continue dietary modifications, including reducing sugar intake and consuming a balanced diet with fruits like blueberries and oatmeal. - Engage in regular physical activity. - Follow-up with vascular surgeon on May 5th. - Appointment for [facility] 04/23 - Maintain blood pressure control <130/80 - Continue statin, plavix    Type 2 diabetes mellitus with other specified complication, without  long-term current use of insulin  (HCC) Assessment & Plan: A1c at 6.0. Managing with lifestyle modifications and supplements. Consider metformin or other medications if lifestyle modifications are insufficient. - Consider metformin if lifestyle modifications are insufficient. - Monitor blood glucose levels regularly. - Reassess blood sugar control in follow-up visit.   Pain in left testicle Assessment & Plan: Persistent soreness, possibly related to recent surgical procedures. Previous  urology evaluation did not find significant issues. - Report any changes or worsening of pain.     PDMP reviewed  Return in about 4 weeks (around 09/20/2023) for PCP, HTN.  Valli Gaw, MD

## 2023-09-02 ENCOUNTER — Other Ambulatory Visit: Payer: Self-pay

## 2023-09-02 ENCOUNTER — Other Ambulatory Visit: Payer: Self-pay | Admitting: Cardiology

## 2023-09-02 DIAGNOSIS — I2510 Atherosclerotic heart disease of native coronary artery without angina pectoris: Secondary | ICD-10-CM

## 2023-09-02 MED ORDER — NITROGLYCERIN 0.4 MG SL SUBL: 0.4000 mg | SUBLINGUAL_TABLET | SUBLINGUAL | 3 refills | Status: DC | PRN

## 2023-09-05 ENCOUNTER — Encounter: Payer: Self-pay | Admitting: Family Medicine

## 2023-09-05 DIAGNOSIS — Z8673 Personal history of transient ischemic attack (TIA), and cerebral infarction without residual deficits: Secondary | ICD-10-CM | POA: Insufficient documentation

## 2023-09-05 NOTE — Assessment & Plan Note (Addendum)
 Three strokes with no significant deficits. Recent stroke confirmed by MRI. Carotid artery stenosis with 65% occlusion led to stent placement. No current neurological deficits. - Lifestyle modifications to manage diabetes and overall health.  - Avoid/Limit alcohol intake - Continue dietary modifications, including reducing sugar intake and consuming a balanced diet with fruits like blueberries and oatmeal. - Engage in regular physical activity. - Follow-up with vascular surgeon on May 5th. - Appointment for [facility] 04/23 - Maintain blood pressure control <130/80 - Continue statin, plavix 

## 2023-09-05 NOTE — Assessment & Plan Note (Signed)
 Persistent soreness, possibly related to recent surgical procedures. Previous urology evaluation did not find significant issues. - Report any changes or worsening of pain.

## 2023-09-05 NOTE — Assessment & Plan Note (Signed)
 A1c at 6.0. Managing with lifestyle modifications and supplements. Consider metformin or other medications if lifestyle modifications are insufficient. - Consider metformin if lifestyle modifications are insufficient. - Monitor blood glucose levels regularly. - Reassess blood sugar control in follow-up visit.

## 2023-09-05 NOTE — Assessment & Plan Note (Signed)
 Blood pressure low since medication adjustments. Current management includes holding certain antihypertensive medications. Target is less than 130/80 mmHg. Plan to restart ramipril  if blood pressure rises. - Monitor blood pressure regularly. - Restart ramipril  if blood pressure rises. - Follow up with cardiologist if issues arise. - Check Cmet, CBC today

## 2023-09-07 NOTE — Progress Notes (Unsigned)
 [facility] [address] [place]. [place] [address] [phone]       HOSPITAL FOLLOW UP NOTE  [name] Date of Birth:  Sep 29, 1945 Medical Record Number:  [id number]   Reason for Referral:  hospital stroke follow up    SUBJECTIVE:   CHIEF COMPLAINT:  No chief complaint on file.   HPI:   [name] is a 78 y.o. male with hx of CAD, HTN,HLD, DM2, prior TIAs, prior history of alcohol use who presented to ED on 02/13/2024 with difficulty using his right hand.  Stroke workup revealed right temporal lobe and CR/SO several small infarcts likely secondary to symptomatic right ICA stenosis. Underwent R TCAR 4/1, placed on aspirin , plavix  and statin and rec'd outpatient VVS f/u.  LDL 42, A1c 6.0.  He was discharged home in stable condition without therapy needs.        PERTINENT IMAGING  CT head: No acute abnormality.  Remote lacunar infarct left thalamus.    CTA head & neck: No LVO Approximately 65% stenosis at the origin of the right cervical ICA and 60% stenosis at the origin of the left cervical ICA MRI  10 mm focus of acute infarct in the right centrum semiovale. Additional punctate foci of acute infarct involving the right precentral gyrus near the hand knob as well as the posterior right parietal cortex and lateral right temporal cortex. 2D Echo: LVEF 60-65%, Mildly dilated LA LDL 42 HgbA1c 6.0    ROS:   14 system review of systems performed and negative with exception of ***  PMH:  Past Medical History:  Diagnosis Date   Acute cholecystitis 04/18/2013   Adenomatous colon polyp 12/2004   Adenomatous colon polyp 12/16/2004   Anemia    Anemia    Blood transfusion without reported diagnosis    BPH associated with nocturia    CAD (coronary artery disease)    a. s/p CABG x4 in 2000 with LIMA-LAD, reverse SVG-IM, reverse SVG-acute margin and reverse SVG-RCA b. s/p BMS to SVG-RI in 2012 c. 12/2016: PCI/DES to SVG-RCA and PCI/DES to  SVG-RI (Dr. Lavonne Prairie)    Carotid artery stenosis    mild    Cataract    Cervical radiculopathy 03/26/2020   Clotting disorder (HCC)    COLONIC POLYPS, HX OF 09/30/2006   Qualifier: Diagnosis of   By: Donnice Gale MD, William          Polypectomy 2004, 2009; Dr Sandrea Cruel         Coronary atherosclerosis of artery bypass graft 09/30/2006   Qualifier: Diagnosis of   By: Donnice Gale MD, William       Diabetes mellitus without complication (HCC) 09/28/2016   recent A1C 6.1 12/25/16 controlled with diet and exercise    Diverticulitis 2008/2009   Diverticulitis    Dizziness 04/21/2020   Educated about COVID-19 virus infection 10/06/2018   GERD 02/22/2007   Qualifier: Diagnosis of   By: Donnice Gale MD, Sammie Crigler       GERD (gastroesophageal reflux disease)    controlled as of 04/01/17    HSV infection    HTN (hypertension)    HTN (hypertension) 09/30/2006   Qualifier: Diagnosis of   By: Donnice Gale MD, William       Hx of CABG 12/25/2020   Hx of dysplastic nevus 05/04/2018   upper back spinal    Hyperglycemia    Hyperlipidemia    Hyperlipidemia LDL goal <70 01/08/2017   Impingement syndrome of left shoulder 2016   Impingement  syndrome of left shoulder 05/18/2014   Kidney cysts 06/20/2019   Low back pain 02/15/2019   Low testosterone     Dr Fredrick Jenkins, [facility] ,[place]   Myocardial infarction [facility])    NSTEMI (non-ST elevated myocardial infarction) (HCC) 12/25/2016   Osteoarthritis of right knee    With trace effusion   Peyronie disease    Piriformis syndrome of right side 09/22/2019   Prediabetes 03/29/2018   Prostatitis 12/23/2017   Right arm weakness 02/02/2020   RLS (restless legs syndrome) 08/15/2013   RLS (restless legs syndrome) 08/15/2013   Rotator cuff injury    right; at 19-83 years old.    Sinus bradycardia    Sinus bradycardia    Thrombocytopenia (HCC)    TIA (transient ischemic attack) 01/19/2020   Tubular adenoma of colon 06/26/2020   Type 2 diabetes mellitus without complication, without  long-term current use of insulin  (HCC) 09/28/2016   Unstable angina (HCC) 12/01/2019   Vitamin D  deficiency     PSH:  Past Surgical History:  Procedure Laterality Date   CHOLECYSTECTOMY N/A 04/21/2013   Procedure: LAPAROSCOPIC CHOLECYSTECTOMY WITH INTRAOPERATIVE CHOLANGIOGRAM;  Surgeon: Darcella Earnest, MD;  Location: [facility] OR;  Service: General;  Laterality: N/A;   CHOLECYSTECTOMY     COLONOSCOPY W/ POLYPECTOMY  2004 & 2009    X 2; Dr Sandrea Cruel; due 2014   CORONARY ANGIOPLASTY     CORONARY ARTERY BYPASS GRAFT  04/1999   4 vessels   CORONARY STENT INTERVENTION N/A 12/28/2016   Procedure: CORONARY STENT INTERVENTION;  Surgeon: Avanell Leigh, MD;  Location: [facility] INVASIVE CV LAB;  Service: Cardiovascular;  Laterality: N/A;   CORONARY STENT PLACEMENT  2012   Plavix    LAPAROSCOPIC CHOLECYSTECTOMY  04/21/2013   Dr Linell Rhymes; acutecholecystitis with necrosis   LEFT HEART CATH AND CORS/GRAFTS ANGIOGRAPHY N/A 12/28/2016   Procedure: LEFT HEART CATH AND CORS/GRAFTS ANGIOGRAPHY;  Surgeon: Avanell Leigh, MD;  Location: [facility] INVASIVE CV LAB;  Service: Cardiovascular;  Laterality: N/A;   LEFT HEART CATH AND CORS/GRAFTS ANGIOGRAPHY N/A 12/01/2019   Procedure: LEFT HEART CATH AND CORS/GRAFTS ANGIOGRAPHY;  Surgeon: Swaziland, Peter M, MD;  Location: [facility] INVASIVE CV LAB;  Service: Cardiovascular;  Laterality: N/A;   TRANSCAROTID ARTERY REVASCULARIZATION  Right 08/17/2023   Procedure: RIGHT TRANSCAROTID ARTERY REVASCULARIZATION (TCAR);  Surgeon: Philipp Brawn, MD;  Location: [facility] OR;  Service: Vascular;  Laterality: Right;   ULTRASOUND GUIDANCE FOR VASCULAR ACCESS Bilateral 08/17/2023   Procedure: ULTRASOUND GUIDANCE, FOR VASCULAR ACCESS;  Surgeon: Philipp Brawn, MD;  Location: [facility] OR;  Service: Vascular;  Laterality: Bilateral;   VASECTOMY      Social History:  Social History   Socioeconomic History   Marital status: Married    Spouse name: Not on file   Number of children: 1   Years of education: Not on  file   Highest education level: Not on file  Occupational History   Occupation:      Comment: plumber  Tobacco Use   Smoking status: Never   Smokeless tobacco: Never  Vaping Use   Vaping status: Never Used  Substance and Sexual Activity   Alcohol use: Yes    Alcohol/week: 10.0 - 12.0 standard drinks of alcohol    Types: 10 - 12 Cans of beer per week    Comment: 2-3 beers + 2-3 whiskey per day (most days)   Drug use: No   Sexual activity: Yes    Birth control/protection: None  Other Topics Concern   Not on  file  Social History Narrative   Remarried 2014   1 son age 20 as of 03/2017       Lives at home with spouse   Right handed   Caffeine: 1-2 cups/day (coffee, green tea)   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[place]    Social Drivers of Corporate investment banker Strain: Low Risk  (12/16/2022)   Overall Financial Resource Strain (CARDIA)    Difficulty of Paying Living Expenses: Not hard at all  Food Insecurity: No Food Insecurity (08/19/2023)   Hunger Vital Sign    Worried About Running Out of Food in the Last Year: Never true    Ran Out of Food in the Last Year: Never true  Transportation Needs: No Transportation Needs (08/19/2023)   PRAPARE - Administrator, Civil Service (Medical): No    Lack of Transportation (Non-Medical): No  Physical Activity: Inactive (12/16/2022)   Exercise Vital Sign    Days of Exercise per Week: 0 days    Minutes of Exercise per Session: 0 min  Stress: No Stress Concern Present (12/16/2022)   [employer] of Occupational Health - Occupational Stress Questionnaire    Feeling of Stress : Not at all  Social Connections: Socially Integrated (08/14/2023)   Social Connection and Isolation Panel [NHANES]    Frequency of Communication with Friends and Family: More than three times a week    Frequency of Social Gatherings with Friends and Family: More than three times a week    Attends Religious Services: More than 4 times per year    Active  Member of [employer] or Organizations: Yes    Attends Engineer, structural: More than 4 times per year    Marital Status: Married  Catering manager Violence: Not At Risk (08/19/2023)   Humiliation, Afraid, Rape, and Kick questionnaire    Fear of Current or Ex-Partner: No    Emotionally Abused: No    Physically Abused: No    Sexually Abused: No    Family History:  Family History  Problem Relation Age of Onset   Coronary artery disease Mother        CABG in 77s   CVA Mother        cns bleed from warfarin   Aneurysm Mother    Aortic aneurysm Father 96       ? abdominal   Heart attack Father 9   Cholecystitis Sister    Diabetes Brother    Diabetes Other        PGaunt   Cancer Neg Hx    Colon cancer Neg Hx    Prostate cancer Neg Hx    Esophageal cancer Neg Hx    Stomach cancer Neg Hx    Rectal cancer Neg Hx     Medications:   Current Outpatient Medications on File Prior to Visit  Medication Sig Dispense Refill   Ascorbic Acid (VITAMIN C) 1000 MG tablet Take 1,000 mg by mouth daily at 2 am. Reported on 09/26/2015     aspirin  EC 81 MG tablet Take 81 mg by mouth daily at 2 am. Swallow whole.     cholecalciferol (VITAMIN D ) 25 MCG (1000 UNIT) tablet Take 1,000 Units by mouth daily at 2 am.     clopidogrel  (PLAVIX ) 75 MG tablet TAKE 1 TABLET BY MOUTH DAILY 90 tablet 3   Coenzyme Q10 (CO Q-10 PO) Take 1 capsule by mouth daily at 2 am.     dicyclomine  (BENTYL ) 10 MG capsule Take 1  capsule (10 mg total) by mouth 3 (three) times daily before meals. 270 capsule 3   dutasteride  (AVODART ) 0.5 MG capsule Take 1 capsule (0.5 mg total) by mouth daily. 90 capsule 3   ezetimibe  (ZETIA ) 10 MG tablet TAKE 1 TABLET BY MOUTH DAILY 90 tablet 3   GINKGO BILOBA PO Take 1 capsule by mouth daily at 2 am.      isosorbide  mononitrate (IMDUR ) 30 MG 24 hr tablet Take 1 tablet (30 mg total) by mouth daily. 90 tablet 0   MAGNESIUM  PO Take 1 tablet by mouth daily at 2 am.     Multiple Vitamin  (MULTIVITAMIN WITH MINERALS) TABS tablet Take 1 tablet by mouth daily at 2 am.     niacin  250 MG tablet Take 250 mg by mouth daily.     nitroGLYCERIN  (NITROSTAT ) 0.4 MG SL tablet Place 1 tablet (0.4 mg total) under the tongue every 5 (five) minutes x 3 doses as needed. 25 tablet 3   Omega-3 Fatty Acids (FISH OIL) 1000 MG CAPS Take 2,000 mg by mouth daily at 2 am.      pantoprazole  (PROTONIX ) 40 MG tablet Take 1 tablet (40 mg total) by mouth daily. 90 tablet 3   ramipril  (ALTACE ) 10 MG capsule Take 1 capsule (10 mg total) by mouth daily. (Patient not taking: Reported on 08/23/2023) 90 capsule 3   rosuvastatin  (CRESTOR ) 40 MG tablet TAKE 1 TABLET AT BEDTIME 90 tablet 3   tamsulosin  (FLOMAX ) 0.4 MG CAPS capsule Take 1 capsule (0.4 mg total) by mouth daily at 2 am. 90 capsule 2   vitamin E 100 UNIT capsule Take 100 Units by mouth daily at 2 am.      No current facility-administered medications on file prior to visit.    Allergies:   Allergies  Allergen Reactions   Darvocet [Propoxyphene N-Acetaminophen ] Hives   Propoxyphene Hives      OBJECTIVE:  Physical Exam  There were no vitals filed for this visit. There is no height or weight on file to calculate BMI. No results found.   General: well developed, well nourished, seated, in no evident distress Head: head normocephalic and atraumatic.   Neck: supple with no carotid or supraclavicular bruits Cardiovascular: regular rate and rhythm, no murmurs Musculoskeletal: no deformity Skin:  no rash/petichiae Vascular:  Normal pulses all extremities   Neurologic Exam Mental Status: Awake and fully alert. Oriented to place and time. Recent and remote memory intact. Attention span, concentration and fund of knowledge appropriate. Mood and affect appropriate.  Cranial Nerves: Fundoscopic exam reveals sharp disc margins. Pupils equal, briskly reactive to light. Extraocular movements full without nystagmus. Visual fields full to confrontation.  Hearing intact. Facial sensation intact. Face, tongue, palate moves normally and symmetrically.  Motor: Normal bulk and tone. Normal strength in all tested extremity muscles Sensory.: intact to touch , pinprick , position and vibratory sensation.  Coordination: Rapid alternating movements normal in all extremities. Finger-to-nose and heel-to-shin performed accurately bilaterally. Gait and Station: Arises from chair without difficulty. Stance is normal. Gait demonstrates normal stride length and balance with ***. Tandem walk and heel toe ***.  Reflexes: 1+ and symmetric. Toes downgoing.     NIHSS  *** Modified Rankin  ***      ASSESSMENT: Kevin Webb is a 78 y.o. year old male with right temporal lobe and CR/SO several small infarcts in 07/2023 secondary to symptomatic right ICA stenosis s/p TCAR. Vascular risk factors include carotid stenosis, hx of stroke 2021, HTN,  HLD, DM, CAD, NSTEMI 2018 and alcohol abuse.      PLAN:  *** :  Residual deficit: ***.  Continue {anticoagluation:28091} and {statin:28096} for secondary stroke prevention managed/prescribed by PCP.   Discussed secondary stroke prevention measures and importance of close PCP follow up for aggressive stroke risk factor management including BP goal<130/90, HLD with LDL goal<70 and DM with A1c.<7 .  Stroke labs ***: LDL ***, A1c *** I have gone over the pathophysiology of stroke, warning signs and symptoms, risk factors and their management in some detail with instructions to go to the closest emergency room for symptoms of concern.     Follow up in *** or call earlier if needed   CC:  [facility] provider: Dr. Janett Medin PCP: Valli Gaw, MD    I spent *** minutes of face-to-face and non-face-to-face time with patient.  This included previsit chart review including review of recent hospitalization, lab review, study review, order entry, electronic health record documentation, patient education regarding recent stroke  including etiology, secondary stroke prevention measures and importance of managing stroke risk factors, residual deficits and typical recovery time and answered all other questions to patient satisfaction   Johny Nap, AGNP-BC  [facility] [address] [place], [place] [address]  Phone [phone] Fax [phone] Note: This document was prepared with digital dictation and possible smart phrase technology. Any transcriptional errors that result from this process are unintentional.

## 2023-09-08 ENCOUNTER — Encounter: Payer: Self-pay | Admitting: Adult Health

## 2023-09-08 ENCOUNTER — Ambulatory Visit (INDEPENDENT_AMBULATORY_CARE_PROVIDER_SITE_OTHER): Admitting: Adult Health

## 2023-09-08 VITALS — BP 118/80 | HR 82 | Ht 69.0 in | Wt 198.2 lb

## 2023-09-08 DIAGNOSIS — Z9862 Peripheral vascular angioplasty status: Secondary | ICD-10-CM

## 2023-09-08 DIAGNOSIS — I63231 Cerebral infarction due to unspecified occlusion or stenosis of right carotid arteries: Secondary | ICD-10-CM

## 2023-09-08 NOTE — Patient Instructions (Addendum)
 Continue aspirin  81 mg daily, Plavix , Zetia  and Crestor   for secondary stroke prevention  Follow up with vascular surgery next month as scheduled   Continue to follow with cardiology as needed   Continue to follow up with PCP regarding blood pressure, cholesterol and diabetes management  Maintain strict control of hypertension with blood pressure goal below 130/90, diabetes with hemoglobin A1c goal below 7.0 % and cholesterol with LDL cholesterol (bad cholesterol) goal below 70 mg/dL.   Signs of a Stroke? Follow the BEFAST method:  Balance Watch for a sudden loss of balance, trouble with coordination or vertigo Eyes Is there a sudden loss of vision in one or both eyes? Or double vision?  Face: Ask the person to smile. Does one side of the face droop or is it numb?  Arms: Ask the person to raise both arms. Does one arm drift downward? Is there weakness or numbness of a leg? Speech: Ask the person to repeat a simple phrase. Does the speech sound slurred/strange? Is the person confused ? Time: If you observe any of these signs, call 911.       Thank you for coming to see us  at [facility]. I hope we have been able to provide you high quality care today.  You may receive a patient satisfaction survey over the next few weeks. We would appreciate your feedback and comments so that we may continue to improve ourselves and the health of our patients.     Stroke Prevention Some medical conditions and lifestyle choices can lead to a higher risk for a stroke. You can help to prevent a stroke by eating healthy foods and exercising. It also helps to not smoke and to manage any health problems you may have. How can this condition affect me? A stroke is an emergency. It should be treated right away. A stroke can lead to brain damage or threaten your life. There is a better chance of surviving and getting better after a stroke if you get medical help right away. What can increase  my risk? The following medical conditions may increase your risk of a stroke: Diseases of the heart and blood vessels (cardiovascular disease). High blood pressure (hypertension). Diabetes. High cholesterol. Sickle cell disease. Problems with blood clotting. Being very overweight. Sleeping problems (obstructivesleep apnea). Other risk factors include: Being older than age 76. A history of blood clots, stroke, or mini-stroke (TIA). Race, ethnic background, or a family history of stroke. Smoking or using tobacco products. Taking birth control pills, especially if you smoke. Heavy alcohol and drug use. Not being active. What actions can I take to prevent this? Manage your health conditions High cholesterol. Eat a healthy diet. If this is not enough to manage your cholesterol, you may need to take medicines. Take medicines as told by your doctor. High blood pressure. Try to keep your blood pressure below 130/80. If your blood pressure cannot be managed through a healthy diet and regular exercise, you may need to take medicines. Take medicines as told by your doctor. Ask your doctor if you should check your blood pressure at home. Have your blood pressure checked every year. Diabetes. Eat a healthy diet and get regular exercise. If your blood sugar (glucose) cannot be managed through diet and exercise, you may need to take medicines. Take medicines as told by your doctor. Talk to your doctor about getting checked for sleeping problems. Signs of a problem can include: Snoring a lot. Feeling very tired. Make sure that  you manage any other conditions you have. Nutrition  Follow instructions from your doctor about what to eat or drink. You may be told to: Eat and drink fewer calories each day. Limit how much salt (sodium) you use to 1,500 milligrams (mg) each day. Use only healthy fats for cooking, such as olive oil, canola oil, and sunflower oil. Eat healthy foods. To do  this: Choose foods that are high in fiber. These include whole grains, and fresh fruits and vegetables. Eat at least 5 servings of fruits and vegetables a day. Try to fill one-half of your plate with fruits and vegetables at each meal. Choose low-fat (lean) proteins. These include low-fat cuts of meat, chicken without skin, fish, tofu, beans, and nuts. Eat low-fat dairy products. Avoid foods that: Are high in salt. Have saturated fat. Have trans fat. Have cholesterol. Are processed or pre-made. Count how many carbohydrates you eat and drink each day. Lifestyle If you drink alcohol: Limit how much you have to: 0-1 drink a day for women who are not pregnant. 0-2 drinks a day for men. Know how much alcohol is in your drink. In the [place] one drink equals one 12 oz bottle of beer ( ), one 5 oz glass of wine ( ), or one 1 oz glass of hard liquor (44mL). Do not smoke or use any products that have nicotine or tobacco. If you need help quitting, ask your doctor. Avoid secondhand smoke. Do not use drugs. Activity  Try to stay at a healthy weight. Get at least 30 minutes of exercise on most days, such as: Fast walking. Biking. Swimming. Medicines Take over-the-counter and prescription medicines only as told by your doctor. Avoid taking birth control pills. Talk to your doctor about the risks of taking birth control pills if: You are over 65 years old. You smoke. You get very bad headaches. You have had a blood clot. Where to find more information American Stroke Association: www.strokeassociation.org Get help right away if: You or a loved one has any signs of a stroke. "BE FAST" is an easy way to remember the warning signs: B - Balance. Dizziness, sudden trouble walking, or loss of balance. E - Eyes. Trouble seeing or a change in how you see. F - Face. Sudden weakness or loss of feeling of the face. The face or eyelid may droop on one side. A - Arms. Weakness or loss of  feeling in an arm. This happens all of a sudden and most often on one side of the body. S - Speech. Sudden trouble speaking, slurred speech, or trouble understanding what people say. T - Time. Time to call emergency services. Write down what time symptoms started. You or a loved one has other signs of a stroke, such as: A sudden, very bad headache with no known cause. Feeling like you may vomit (nausea). Vomiting. A seizure. These symptoms may be an emergency. Get help right away. Call your local emergency services (911 in the [place] Do not wait to see if the symptoms will go away. Do not drive yourself to the hospital. Summary You can help to prevent a stroke by eating healthy, exercising, and not smoking. It also helps to manage any health problems you have. Do not smoke or use any products that contain nicotine or tobacco. Get help right away if you or a loved one has any signs of a stroke. This information is not intended to replace advice given to you by your health care provider. Make sure you discuss  any questions you have with your health care provider. Document Revised: 04/06/2022 Document Reviewed: 04/06/2022 Elsevier Patient Education  2024 [employer].

## 2023-09-10 NOTE — Progress Notes (Signed)
 I agree with the above plan

## 2023-09-15 ENCOUNTER — Other Ambulatory Visit: Payer: Self-pay

## 2023-09-15 DIAGNOSIS — I6522 Occlusion and stenosis of left carotid artery: Secondary | ICD-10-CM

## 2023-09-17 ENCOUNTER — Ambulatory Visit (HOSPITAL_COMMUNITY)
Admission: RE | Admit: 2023-09-17 | Discharge: 2023-09-17 | Disposition: A | Discharge status: Home / Self Care | Source: Ambulatory Visit | Attending: Vascular Surgery | Admitting: Vascular Surgery

## 2023-09-17 DIAGNOSIS — I6522 Occlusion and stenosis of left carotid artery: Secondary | ICD-10-CM | POA: Insufficient documentation

## 2023-09-20 ENCOUNTER — Encounter: Payer: Self-pay | Admitting: Family Medicine

## 2023-09-20 ENCOUNTER — Ambulatory Visit (INDEPENDENT_AMBULATORY_CARE_PROVIDER_SITE_OTHER): Admitting: Family Medicine

## 2023-09-20 VITALS — BP 110/62 | HR 55 | Temp 98.3°F | Resp 20 | Ht 69.0 in | Wt 198.0 lb

## 2023-09-20 DIAGNOSIS — I25118 Atherosclerotic heart disease of native coronary artery with other forms of angina pectoris: Secondary | ICD-10-CM

## 2023-09-20 DIAGNOSIS — E119 Type 2 diabetes mellitus without complications: Secondary | ICD-10-CM

## 2023-09-20 DIAGNOSIS — G2581 Restless legs syndrome: Secondary | ICD-10-CM | POA: Diagnosis not present

## 2023-09-20 DIAGNOSIS — Z8673 Personal history of transient ischemic attack (TIA), and cerebral infarction without residual deficits: Secondary | ICD-10-CM | POA: Diagnosis not present

## 2023-09-20 DIAGNOSIS — E1169 Type 2 diabetes mellitus with other specified complication: Secondary | ICD-10-CM | POA: Diagnosis not present

## 2023-09-20 DIAGNOSIS — E785 Hyperlipidemia, unspecified: Secondary | ICD-10-CM

## 2023-09-20 DIAGNOSIS — K5792 Diverticulitis of intestine, part unspecified, without perforation or abscess without bleeding: Secondary | ICD-10-CM

## 2023-09-20 DIAGNOSIS — I152 Hypertension secondary to endocrine disorders: Secondary | ICD-10-CM | POA: Diagnosis not present

## 2023-09-20 DIAGNOSIS — E1159 Type 2 diabetes mellitus with other circulatory complications: Secondary | ICD-10-CM

## 2023-09-20 DIAGNOSIS — I10 Essential (primary) hypertension: Secondary | ICD-10-CM

## 2023-09-20 MED ORDER — ISOSORBIDE MONONITRATE ER 30 MG PO TB24: 30.0000 mg | ORAL_TABLET | Freq: Every day | ORAL | 3 refills | Status: DC

## 2023-09-20 MED ORDER — CLOPIDOGREL BISULFATE 75 MG PO TABS: 75.0000 mg | ORAL_TABLET | Freq: Every day | ORAL | 3 refills | Status: DC

## 2023-09-20 MED ORDER — CIPROFLOXACIN HCL 500 MG PO TABS: 500.0000 mg | ORAL_TABLET | Freq: Two times a day (BID) | ORAL | 2 refills | Status: DC

## 2023-09-20 MED ORDER — EZETIMIBE 10 MG PO TABS: 10.0000 mg | ORAL_TABLET | Freq: Every day | ORAL | 3 refills | Status: DC

## 2023-09-20 MED ORDER — ROSUVASTATIN CALCIUM 40 MG PO TABS: 40.0000 mg | ORAL_TABLET | Freq: Every day | ORAL | 3 refills | Status: DC

## 2023-09-20 MED ORDER — RAMIPRIL 10 MG PO CAPS: 10.0000 mg | ORAL_CAPSULE | Freq: Every day | ORAL | 3 refills | Status: DC

## 2023-09-20 NOTE — Patient Instructions (Addendum)
 It was a pleasure meeting you today. Thank you for allowing me to take part in your health care.  Our goals for today as we discussed include:  Continue current medications  Refills sent for requested medications  Recommend scheduling appointment with Cardiology for follow up  Follow up with Vascular Surgery as scheduled    This is a list of the screening recommended for you and due dates:  Health Maintenance  Topic Date Due   Eye exam for diabetics  12/14/2021   Yearly kidney health urinalysis for diabetes  11/03/2023   Medicare Annual Wellness Visit  12/16/2023   Flu Shot  12/17/2023   Hemoglobin A1C  02/14/2024   Yearly kidney function blood test for diabetes  08/22/2024   Colon Cancer Screening  04/27/2026   DTaP/Tdap/Td vaccine (3 - Td or Tdap) 06/26/2030   Pneumonia Vaccine  Completed   Hepatitis C Screening  Completed   HPV Vaccine  Aged Out   Meningitis B Vaccine  Aged Out   Complete foot exam   Discontinued   COVID-19 Vaccine  Discontinued   Zoster (Shingles) Vaccine  Discontinued      If you have any questions or concerns, please do not hesitate to call the office at [phone].  I look forward to our next visit and until then take care and stay safe.  Regards,   Valli Gaw, MD   [facility]

## 2023-09-20 NOTE — Progress Notes (Signed)
 SUBJECTIVE:   Chief Complaint  Patient presents with   Hypertension   HPI Presents for follow up chronic disease management  Discussed the use of AI scribe software for clinical note transcription with the patient, who gave verbal consent to proceed.  History of Present Illness Kevin NAKAMA "Gene" is a 78 year old male who presents with fatigue and sleep disturbances.  He experiences persistent fatigue and low energy levels, which he attributes to a recent significant health event. Despite this, he maintains a goal of walking 8,000 steps daily, which may contribute to his tiredness.  He has trouble sleeping at night, describing symptoms consistent with restless legs syndrome. He currently takes an over-the-counter remedy from [employer] and potassium supplements to manage these symptoms. He frequently wakes up and remains awake during the night.  He has a history of diabetes management and is currently not on any diabetic medications. He monitors his condition with over-the-counter supplements such as berberine and [place]. He is mindful of his carbohydrate intake and has reduced his alcohol consumption, limiting himself to no more than two beers and only tasting whiskey.  He mentions a recent weight fluctuation, having lost weight while on his boat but recently gaining back to 187 pounds from 180 pounds.  He is not currently on Altace , and restarted Amlodipine  recently,  which were previously stopped during hospitalization. He notes that his heart rate was elevated and not decreasing as desired, but he has not yet visited a cardiologist.  He discusses the use of antibiotics, specifically Cipro  and Flagyl , which he takes together when symptoms are severe. He has more Flagyl  than Cipro  and uses them as needed.  No fever and his last labs showed no electrolyte imbalances.    PERTINENT PMH / PSH: As above  OBJECTIVE:  BP 110/62   Pulse (!) 55   Temp 98.3 F (36.8 C)   Resp 20    Ht 5\' 9"  (1.753 m)   Wt 198 lb (89.8 kg)   SpO2 98%   BMI 29.24 kg/m    Physical Exam Vitals reviewed.  Constitutional:      General: He is not in acute distress.    Appearance: Normal appearance. He is obese. He is not ill-appearing, toxic-appearing or diaphoretic.  Eyes:     General:        Right eye: No discharge.        Left eye: No discharge.  Cardiovascular:     Rate and Rhythm: Normal rate and regular rhythm.     Heart sounds: Normal heart sounds.  Pulmonary:     Effort: Pulmonary effort is normal.     Breath sounds: Normal breath sounds.  Abdominal:     General: Bowel sounds are normal.  Musculoskeletal:        General: Normal range of motion.     Cervical back: Normal range of motion.  Skin:    General: Skin is warm and dry.  Neurological:     Mental Status: He is alert and oriented to person, place, and time. Mental status is at baseline.  Psychiatric:        Mood and Affect: Mood normal.        Behavior: Behavior normal.        Thought Content: Thought content normal.        Judgment: Judgment normal.           08/23/2023   11:17 AM 05/06/2023   11:35 AM 12/16/2022   10:40 AM  11/03/2022   10:34 AM 05/04/2022    1:00 PM  Depression screen PHQ 2/9  Decreased Interest 0 0 0 0 0  Down, Depressed, Hopeless 0 0 0 0 0  PHQ - 2 Score 0 0 0 0 0  Altered sleeping 2 0 0 0 0  Tired, decreased energy 0 0 0 0 0  Change in appetite 0 0 0 0 0  Feeling bad or failure about yourself  0 0 0 0 0  Trouble concentrating 0 0 0 0 0  Moving slowly or fidgety/restless 0 0 0 0 0  Suicidal thoughts 0 0  0 0  PHQ-9 Score 2 0 0 0 0  Difficult doing work/chores Not difficult at all Not difficult at all Not difficult at all Not difficult at all Not difficult at all      08/23/2023   11:17 AM 05/06/2023   11:35 AM 11/03/2022   10:34 AM 05/04/2022    1:01 PM  GAD 7 : Generalized Anxiety Score  Nervous, Anxious, on Edge 0 0 0 0  Control/stop worrying 0 0 0 0  Worry too much -  different things 0 0 0 0  Trouble relaxing 0 0 0 0  Restless 0 0 0 0  Easily annoyed or irritable 0 0 0 0  Afraid - awful might happen 0 0 0 0  Total GAD 7 Score 0 0 0 0  Anxiety Difficulty Not difficult at all Not difficult at all Not difficult at all Not difficult at all    ASSESSMENT/PLAN:  Hypertension associated with diabetes [name]) Assessment & Plan: Hypertension management ongoing.  Currently asymptomatic.  Restarted Altace  due to increasing BP at home. - Refill Altace  10 mg daily - Continue to hold Amlodipine . Consider restarting at next visit if BP elevates - Refill Imdur  30 mg daily - Recommend he also  schedule Cardiology follow-up.  Orders: -     Ramipril ; Take 1 capsule (10 mg total) by mouth daily.  Dispense: 90 capsule; Refill: 3  Hyperlipidemia associated with type 2 diabetes mellitus (HCC) Assessment & Plan: Refill Zetia  Refill Crestor   Orders: -     Rosuvastatin  Calcium ; Take 1 tablet (40 mg total) by mouth at bedtime.  Dispense: 90 tablet; Refill: 3 -     Ezetimibe ; Take 1 tablet (10 mg total) by mouth daily.  Dispense: 90 tablet; Refill: 3  Type 2 diabetes mellitus without complication, without long-term current use of insulin  (HCC) Assessment & Plan: A1c at 6.0. Managing with lifestyle modifications and supplements.  - On ACE and statin - A1c in  6 months  Orders: -     Ezetimibe ; Take 1 tablet (10 mg total) by mouth daily.  Dispense: 90 tablet; Refill: 3  History of CVA (cerebrovascular accident) without residual deficits Assessment & Plan: Recently had Neurology follow up No changes Recommend follow up as needed Refill Crestor  and Plavix    Orders: -     Rosuvastatin  Calcium ; Take 1 tablet (40 mg total) by mouth at bedtime.  Dispense: 90 tablet; Refill: 3 -     Clopidogrel  Bisulfate; Take 1 tablet (75 mg total) by mouth daily.  Dispense: 90 tablet; Refill: 3  Diverticulitis Assessment & Plan: Refill Cipro  and Flagyl  for  prophylaxis  Orders: -     Ciprofloxacin  HCl; Take 1 tablet (500 mg total) by mouth 2 (two) times daily.  Dispense: 20 tablet; Refill: 2  Coronary artery disease of native artery of native heart with stable angina pectoris (HCC) -  Isosorbide  Mononitrate ER; Take 1 tablet (30 mg total) by mouth daily.  Dispense: 90 tablet; Refill: 3 -     Clopidogrel  Bisulfate; Take 1 tablet (75 mg total) by mouth daily.  Dispense: 90 tablet; Refill: 3  RLS (restless legs syndrome) Assessment & Plan: Restless legs syndrome contributing to sleep disturbances. Uses over-the-counter supplements for management. Prefers to old off on prescription medication If worsens can follow up with PCP    PDMP reviewed  Return if symptoms worsen or fail to improve, for PCP.  Valli Gaw, MD

## 2023-09-24 ENCOUNTER — Ambulatory Visit: Attending: Vascular Surgery | Admitting: Physician Assistant

## 2023-09-24 VITALS — BP 131/81 | HR 43 | Temp 98.0°F | Ht 69.0 in | Wt 197.1 lb

## 2023-09-24 DIAGNOSIS — I6522 Occlusion and stenosis of left carotid artery: Secondary | ICD-10-CM

## 2023-09-24 NOTE — Progress Notes (Signed)
 POST OPERATIVE OFFICE NOTE    CC:  F/u for surgery  HPI:  Kevin Webb is a 78 y.o. male who is here for postop visit.  He recently underwent right TCAR on 08/17/2023 by Dr. Susi Eric.  This was done for symptomatic right carotid artery stenosis.  His postoperative CTA demonstrated 60% left carotid artery stenosis.  He returns today for follow up.  He says that he has been doing well since his surgery.  He denies any issues with his incisions such as drainage, redness, or dehiscence.  He denies any further neurological events since his hospitalization such as slurred speech, facial droop, sudden visual changes, or sudden weakness/numbness.  He takes a daily aspirin , Plavix , and statin.   Allergies  Allergen Reactions   Darvocet [Propoxyphene N-Acetaminophen ] Hives   Propoxyphene Hives    Current Outpatient Medications  Medication Sig Dispense Refill   Ascorbic Acid (VITAMIN C) 1000 MG tablet Take 1,000 mg by mouth daily at 2 am. Reported on 09/26/2015     aspirin  EC 81 MG tablet Take 81 mg by mouth daily at 2 am. Swallow whole.     cholecalciferol (VITAMIN D ) 25 MCG (1000 UNIT) tablet Take 1,000 Units by mouth daily at 2 am.     ciprofloxacin  (CIPRO ) 500 MG tablet Take 1 tablet (500 mg total) by mouth 2 (two) times daily. 20 tablet 2   clopidogrel  (PLAVIX ) 75 MG tablet Take 1 tablet (75 mg total) by mouth daily. 90 tablet 3   Coenzyme Q10 (CO Q-10 PO) Take 1 capsule by mouth daily at 2 am.     dicyclomine  (BENTYL ) 10 MG capsule Take 1 capsule (10 mg total) by mouth 3 (three) times daily before meals. 270 capsule 3   dutasteride  (AVODART ) 0.5 MG capsule Take 1 capsule (0.5 mg total) by mouth daily. 90 capsule 3   ezetimibe  (ZETIA ) 10 MG tablet Take 1 tablet (10 mg total) by mouth daily. 90 tablet 3   GINKGO BILOBA PO Take 1 capsule by mouth daily at 2 am.      isosorbide  mononitrate (IMDUR ) 30 MG 24 hr tablet Take 1 tablet (30 mg total) by mouth daily. 90 tablet 3   MAGNESIUM  PO Take 1  tablet by mouth daily at 2 am.     Multiple Vitamin (MULTIVITAMIN WITH MINERALS) TABS tablet Take 1 tablet by mouth daily at 2 am.     niacin  250 MG tablet Take 250 mg by mouth daily.     nitroGLYCERIN  (NITROSTAT ) 0.4 MG SL tablet Place 1 tablet (0.4 mg total) under the tongue every 5 (five) minutes x 3 doses as needed. 25 tablet 3   Omega-3 Fatty Acids (FISH OIL) 1000 MG CAPS Take 2,000 mg by mouth daily at 2 am.      pantoprazole  (PROTONIX ) 40 MG tablet Take 1 tablet (40 mg total) by mouth daily. 90 tablet 3   ramipril  (ALTACE ) 10 MG capsule Take 1 capsule (10 mg total) by mouth daily. 90 capsule 3   rosuvastatin  (CRESTOR ) 40 MG tablet Take 1 tablet (40 mg total) by mouth at bedtime. 90 tablet 3   tamsulosin  (FLOMAX ) 0.4 MG CAPS capsule Take 1 capsule (0.4 mg total) by mouth daily at 2 am. 90 capsule 2   vitamin E 100 UNIT capsule Take 100 Units by mouth daily at 2 am.      No current facility-administered medications for this visit.     ROS:  See HPI  Physical Exam:   Incision: Well-healed right  neck incision without signs of infection or hematoma Extremities: Palpable and equal radial pulses bilaterally.  Moving all extremities equally Neuro: No slurred speech, facial droop, or tongue deviation.  Alert and oriented x 3  Studies: Dialysis Duplex: (09/17/2023) Right Carotid Findings:  +----------+--------+--------+--------+           PSV cm/sEDV cm/sComments  +----------+--------+--------+--------+  CCA Prox  83      16                +----------+--------+--------+--------+  CCA Distal82      17      stent     +----------+--------+--------+--------+  ICA Prox  71      16      stent     +----------+--------+--------+--------+  ICA Mid   52      17      stent     +----------+--------+--------+--------+  ICA Distal38      12                +----------+--------+--------+--------+  ECA      99      6                  +----------+--------+--------+--------+   +----------+--------+-------+----------------+           PSV cm/sEDV cmsDescribe          +----------+--------+-------+----------------+  Subclavian94     0      Multiphasic, WNL  +----------+--------+-------+----------------+   +---------+--------+--+--------+--+---------+  VertebralPSV cm/s52EDV cm/s11Antegrade  +---------+--------+--+--------+--+---------+   Right Stent(s):  +---------------+--------+--------+  CCA-ICA       PSV cm/sEDV cm/s  +---------------+--------+--------+  Prox to Stent  81      17        +---------------+--------+--------+  Proximal Stent 71      16        +---------------+--------+--------+  Mid Stent      52      16        +---------------+--------+--------+  Distal Stent   38      11        +---------------+--------+--------+  Distal to Stent74      17        +---------------+--------+--------+     Left Carotid Findings:  +----------+--------+--------+--------+-------------------------+           PSV cm/sEDV cm/sStenosisPlaque Description         +----------+--------+--------+--------+-------------------------+  CCA Prox  83      14                                         +----------+--------+--------+--------+-------------------------+  CCA Distal92      17                                         +----------+--------+--------+--------+-------------------------+  ICA Prox  129     17      1-39%   heterogenous and calcific  +----------+--------+--------+--------+-------------------------+  ICA Mid   77      16                                         +----------+--------+--------+--------+-------------------------+  ICA Distal71      26                                         +----------+--------+--------+--------+-------------------------+  ECA      131                     heterogenous and calcific   +----------+--------+--------+--------+-------------------------+   +----------+--------+--------+----------------+           PSV cm/sEDV cm/sDescribe          +----------+--------+--------+----------------+  Subclavian112    3       Multiphasic, WNL  +----------+--------+--------+----------------+   +---------+--------+--+--------+-+---------+  VertebralPSV cm/s24EDV cm/s7Antegrade  +---------+--------+--+--------+-+---------+     Assessment/Plan:  This is a 78 y.o. male who is here for postop visit  - The patient recently underwent right TCAR for symptomatic carotid artery stenosis.  Preoperative CTA demonstrated 65% right carotid stenosis and 60% left carotid stenosis - His right sided neck incision is well-healed without signs of infection -He denies any new or worsening neurological symptoms since his hospitalization.  He denies any slurred speech, facial droop, sudden weakness/numbness, or sudden visual changes - He has no neurological deficits on exam.  He has palpable and equal radial pulses bilaterally -He will continue his aspirin , Plavix , statin.  He can follow-up with our office in 3 months with repeat carotid duplex    Deneise Finlay, PA-C [facility] [phone]   Clinic MD:  Susi Eric

## 2023-09-26 ENCOUNTER — Encounter: Payer: Self-pay | Admitting: Family Medicine

## 2023-09-26 NOTE — Assessment & Plan Note (Signed)
 Restless legs syndrome contributing to sleep disturbances. Uses over-the-counter supplements for management. Prefers to old off on prescription medication If worsens can follow up with PCP

## 2023-09-26 NOTE — Assessment & Plan Note (Signed)
 A1c at 6.0. Managing with lifestyle modifications and supplements.  - On ACE and statin - A1c in  6 months

## 2023-09-26 NOTE — Assessment & Plan Note (Addendum)
 Hypertension management ongoing.  Currently asymptomatic.  Restarted Altace  due to increasing BP at home. - Refill Altace  10 mg daily - Continue to hold Amlodipine . Consider restarting at next visit if BP elevates - Refill Imdur  30 mg daily - Recommend he also  schedule Cardiology follow-up.

## 2023-09-26 NOTE — Assessment & Plan Note (Signed)
 Refill Zetia  Refill Crestor 

## 2023-09-26 NOTE — Assessment & Plan Note (Addendum)
 Recently had Neurology follow up No changes Recommend follow up as needed Refill Crestor  and Plavix 

## 2023-09-26 NOTE — Assessment & Plan Note (Signed)
 Refill Cipro  and Flagyl  for prophylaxis

## 2023-09-29 ENCOUNTER — Other Ambulatory Visit: Payer: Self-pay

## 2023-09-29 DIAGNOSIS — I6522 Occlusion and stenosis of left carotid artery: Secondary | ICD-10-CM

## 2023-10-07 ENCOUNTER — Encounter: Payer: Self-pay | Admitting: Family Medicine

## 2023-10-07 ENCOUNTER — Other Ambulatory Visit: Payer: Self-pay | Admitting: Family Medicine

## 2023-10-07 DIAGNOSIS — G2581 Restless legs syndrome: Secondary | ICD-10-CM

## 2023-10-07 MED ORDER — GABAPENTIN 300 MG PO CAPS: 300.0000 mg | ORAL_CAPSULE | Freq: Every day | ORAL | 3 refills | Status: DC

## 2023-10-20 ENCOUNTER — Encounter: Payer: Self-pay | Admitting: Cardiology

## 2023-10-29 IMAGING — CT CT ABD-PELV W/ CM
3 of 6 series · 16 of 46 positions shown, 18 images · IV contrast (APPLIED)
Comparison: CT abdomen and pelvis 05/09/2019

CLINICAL DATA: Abdominal pain

EXAM:
CT ABDOMEN AND PELVIS WITH CONTRAST
TECHNIQUE: Multidetector CT imaging of the abdomen and pelvis was performed
using the standard protocol following bolus administration of
intravenous contrast.

[Series 2: axial st · axial · 0.98mm/px · z∈[+1274,+1699]mm · 11 of 103 slices shown, 13 images (1 of 2)]
[im 9/103  soft-tissue]
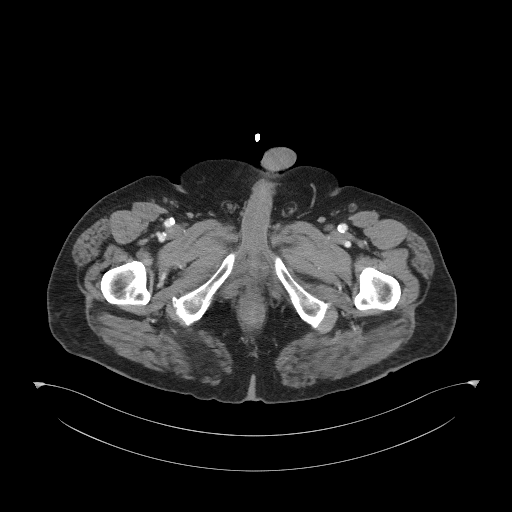
[im 9/103  bone]
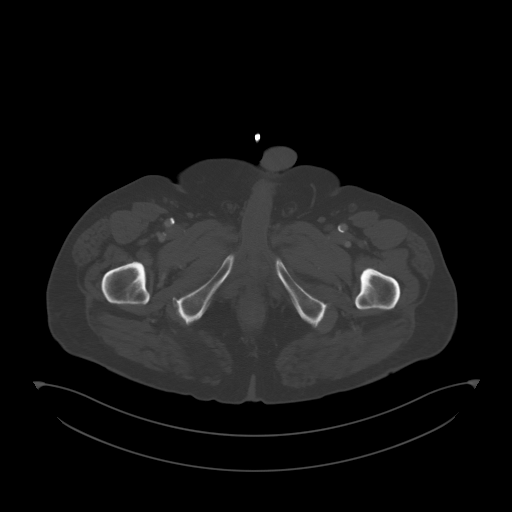
[im 18/103  soft-tissue]
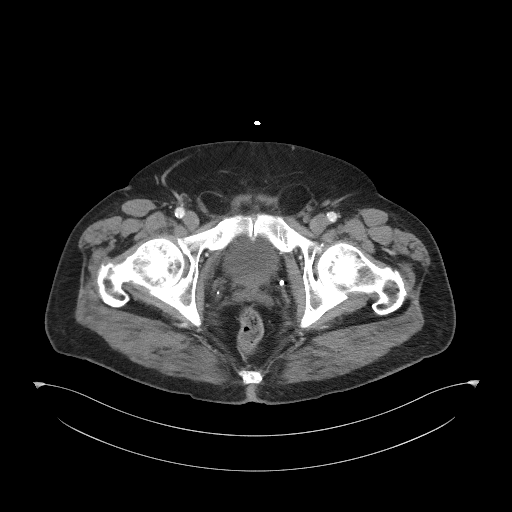
[im 26/103  soft-tissue]
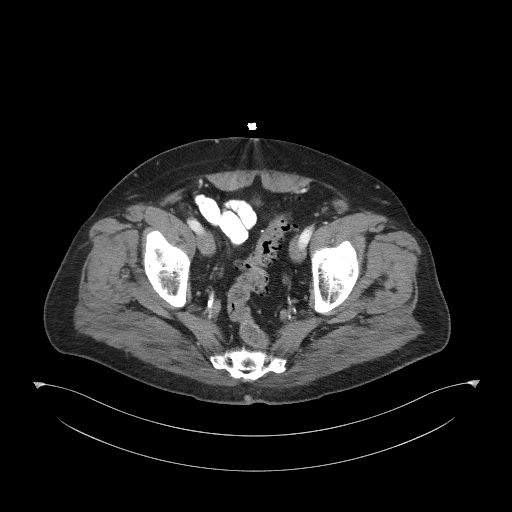
[im 35/103  soft-tissue]
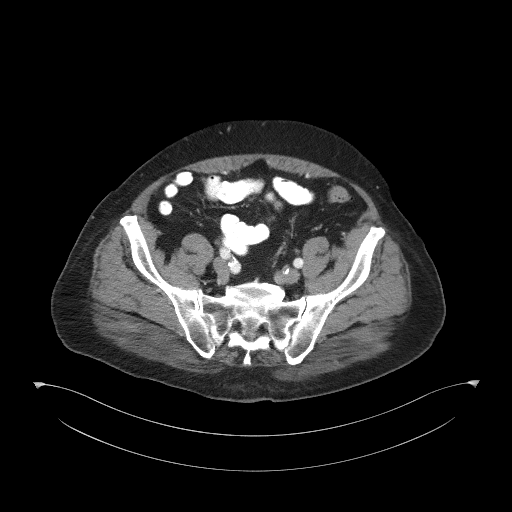
[im 43/103  soft-tissue]
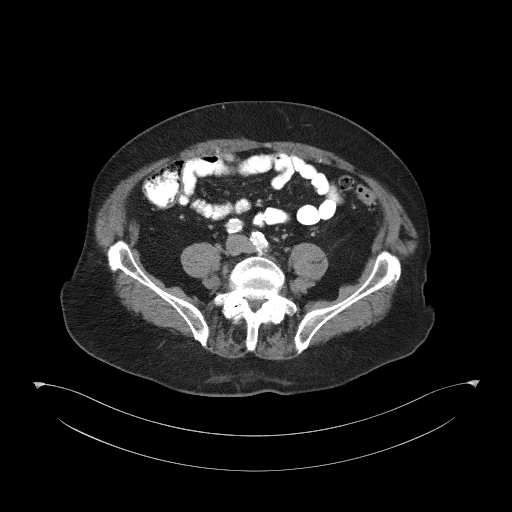
[im 52/103  soft-tissue]
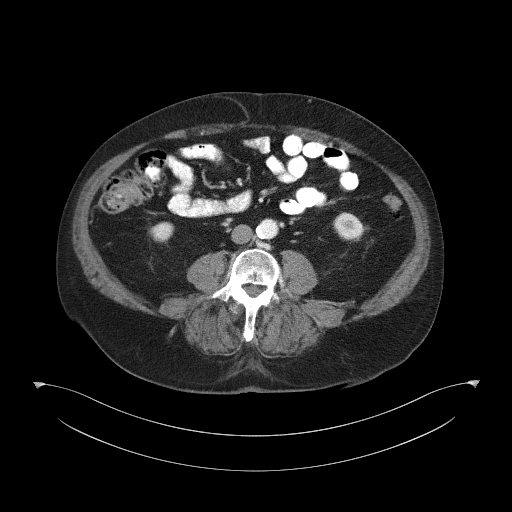
[im 60/103  soft-tissue]
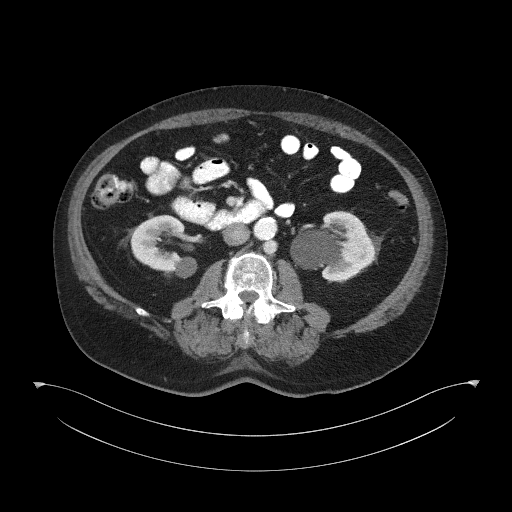
[im 69/103  soft-tissue]
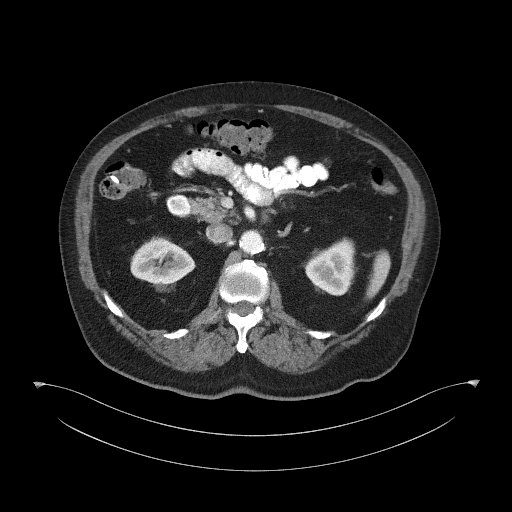
[im 77/103  soft-tissue]
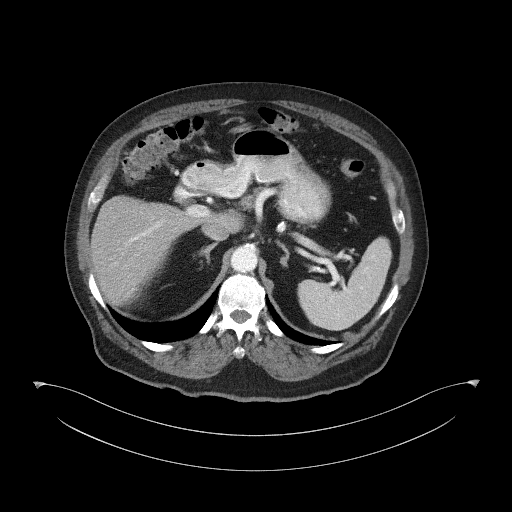
[im 77/103  bone]
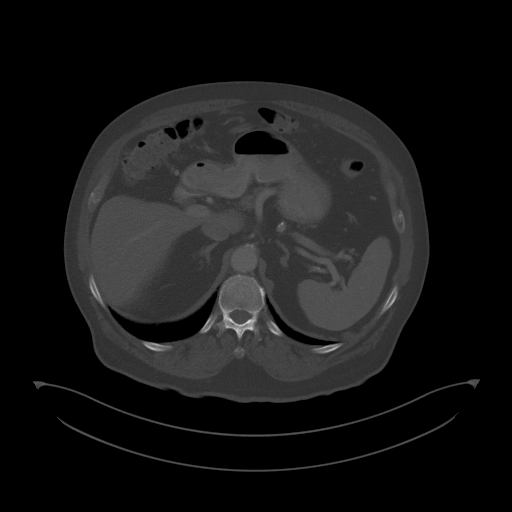
[im 86/103  soft-tissue]
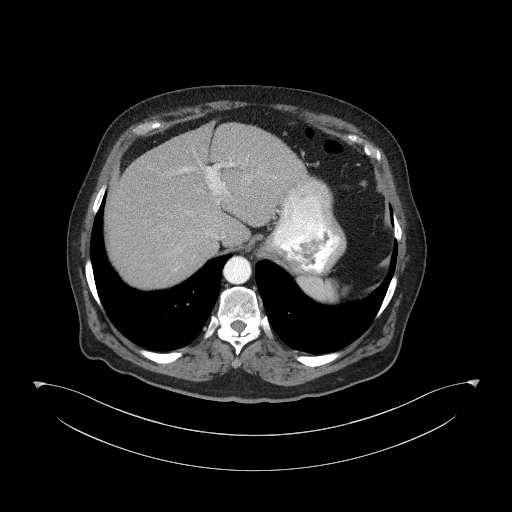
[im 94/103  soft-tissue]
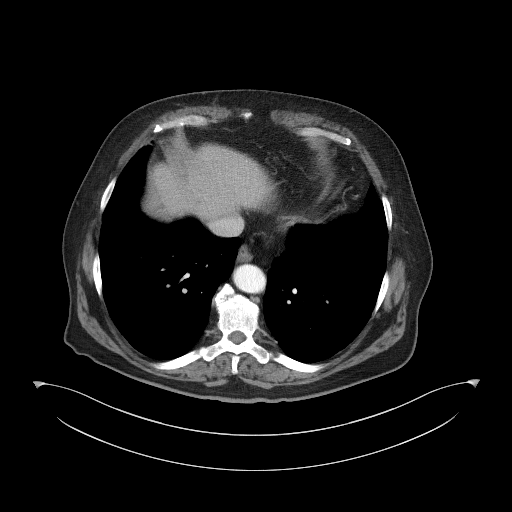

[Series 4: axial st · axial · 0.98mm/px · z∈[+1192,+1237]mm · 2 of 29 slices shown (2 of 2)]
[im 10/29  soft-tissue]
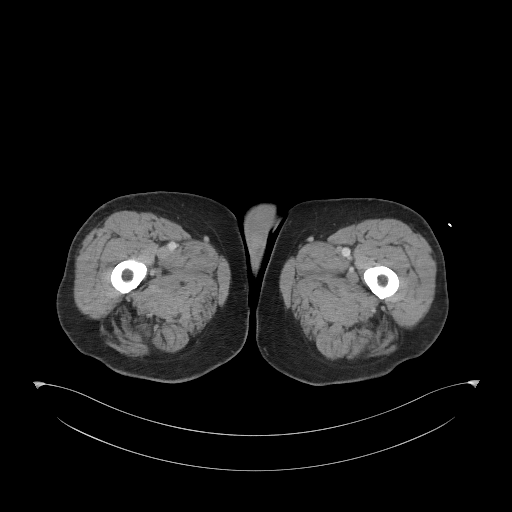
[im 19/29  soft-tissue]
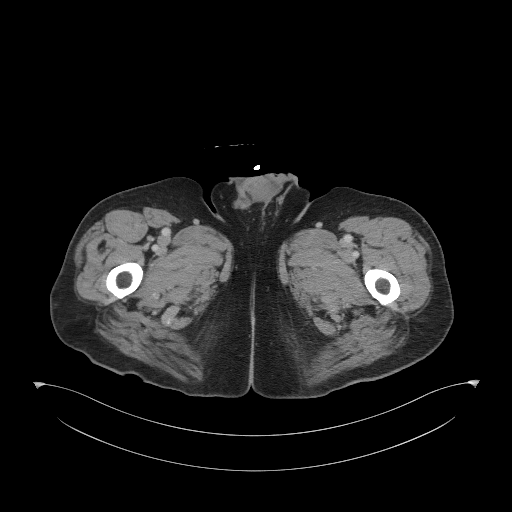

[Series 7: coronal st · coronal · 0.83mm/px · 3 of 110 slices shown]
[im 37/110  soft-tissue]
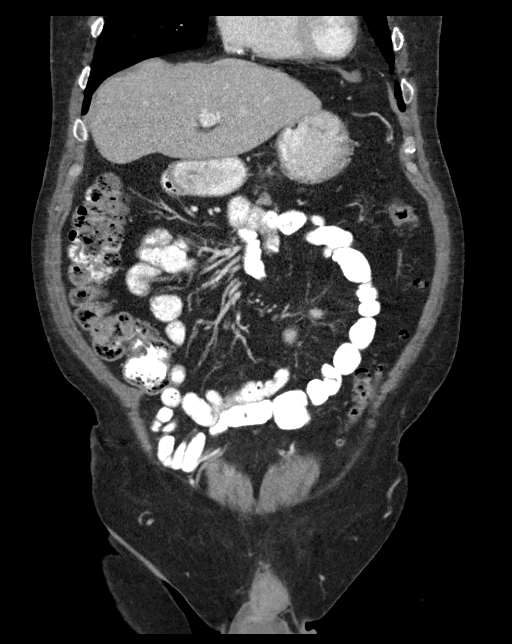
[im 49/110  soft-tissue]
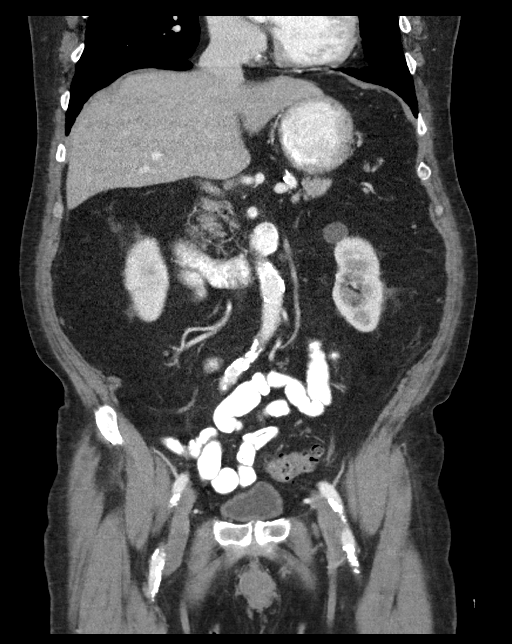
[im 61/110  soft-tissue]
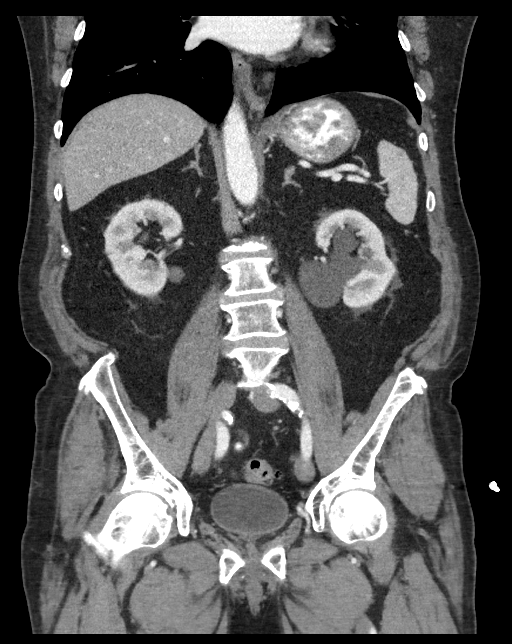

[16 of 46 positions shown; findings below may reference images not displayed]

RADIATION DOSE REDUCTION: This exam was performed according to the
departmental dose-optimization program which includes automated
exposure control, adjustment of the mA and/or kV according to
patient size and/or use of iterative reconstruction technique.

CONTRAST:  100mL OMNIPAQUE IOHEXOL 300 MG/ML  SOLN
FINDINGS: Lower chest: No acute abnormality.

Hepatobiliary: Liver is normal in size and contour with no
suspicious mass identified. A few tiny subcentimeter hypodensities
scattered throughout the liver which are too small to characterize,
most likely cysts or hemangiomas. Gallbladder is surgically absent.
No biliary ductal dilatation identified.

Pancreas: Unremarkable. No pancreatic ductal dilatation or
surrounding inflammatory changes.

Spleen: Normal in size without suspicious abnormality.

Adrenals/Urinary Tract: Adrenal glands appear normal. Symmetric
perfusion of the kidneys. Multiple renal cortical cysts identified
bilaterally measuring up to 2.2 cm at the lower medial right kidney
and 2.1 cm at the anterior left kidney. Chronic moderate left
hydronephrosis and distended renal pelvis, not significantly
changed. The ureters are normal caliber. Urinary bladder appears
normal.

Stomach/Bowel: No bowel obstruction, free air or pneumatosis.
Colonic diverticulosis without evidence of acute diverticulitis. No
evidence of acute appendicitis.

Vascular/Lymphatic: Severe atherosclerotic disease. No bulky
lymphadenopathy identified.

Reproductive: Prostate is unremarkable.

Other: No ascites.

Musculoskeletal: No suspicious bony lesions identified.
IMPRESSION: 1. No acute process identified.
2. Colonic diverticulosis.
3. Stable chronic moderate left hydronephrosis without hydroureter,
possibly secondary to UPJ stenosis.
4. Other chronic findings as described.

## 2023-11-30 ENCOUNTER — Encounter: Payer: Self-pay | Admitting: Cardiology

## 2023-12-21 ENCOUNTER — Ambulatory Visit (INDEPENDENT_AMBULATORY_CARE_PROVIDER_SITE_OTHER): Payer: BLUE CROSS/BLUE SHIELD | Admitting: *Deleted

## 2023-12-21 VITALS — BP 138/68 | HR 59 | Ht 69.0 in | Wt 189.0 lb

## 2023-12-21 DIAGNOSIS — Z0000 Encounter for general adult medical examination without abnormal findings: Secondary | ICD-10-CM | POA: Diagnosis not present

## 2023-12-21 NOTE — Patient Instructions (Signed)
 Kevin Webb , Thank you for taking time out of your busy schedule to complete your Annual Wellness Visit with me. I enjoyed our conversation and look forward to speaking with you again next year. I, as well as your care team,  appreciate your ongoing commitment to your health goals. Please review the following plan we discussed and let me know if I can assist you in the future. Your Game plan/ To Do List    Referrals: If you haven't heard from the office you've been referred to, please reach out to them at the phone provided.  Consider updating your fu vaccine annually.  Follow up Visits: We will see or speak with you next year for your Next Medicare AWV with our clinical staff 12/22/24 @ 10:10 Have you seen your provider in the last 6 months (3 months if uncontrolled diabetes)? Yes  Clinician Recommendations:  Aim for 30 minutes of exercise or brisk walking, 6-8 glasses of water, and 5 servings of fruits and vegetables each day.       This is a list of the screenings recommended for you:  Health Maintenance  Topic Date Due   Yearly kidney health urinalysis for diabetes  03/30/2019   Eye exam for diabetics  12/14/2021   Flu Shot  12/17/2023   Hemoglobin A1C  02/14/2024   Yearly kidney function blood test for diabetes  08/22/2024   Medicare Annual Wellness Visit  12/20/2024   Colon Cancer Screening  04/27/2026   DTaP/Tdap/Td vaccine (3 - Td or Tdap) 06/26/2030   Pneumococcal Vaccine for age over 16  Completed   Hepatitis C Screening  Completed   Hepatitis B Vaccine  Aged Out   HPV Vaccine  Aged Out   Meningitis B Vaccine  Aged Out   Complete foot exam   Discontinued   COVID-19 Vaccine  Discontinued   Zoster (Shingles) Vaccine  Discontinued    Advanced directives: (Copy Requested) Please bring a copy of your health care power of attorney and living will to the office to be added to your chart at your convenience. You can mail to [facility] [address]  [place], [place] [address] or email to [email or web address] Advance Care Planning is important because it:  [x]  Makes sure you receive the medical care that is consistent with your values, goals, and preferences  [x]  It provides guidance to your family and loved ones and reduces their decisional burden about whether or not they are making the right decisions based on your wishes.

## 2023-12-21 NOTE — Progress Notes (Signed)
 Subjective:   Kevin Webb is a 78 y.o. who presents for a Medicare Wellness preventive visit.  As a reminder, Annual Wellness Visits don't include a physical exam, and some assessments may be limited, especially if this visit is performed virtually. We may recommend an in-person follow-up visit with your provider if needed.  Visit Complete: Virtual I connected with  Kru E Ciano on 12/21/23 by a audio enabled telemedicine application and verified that I am speaking with the correct person using two identifiers.  Patient Location: Home  Provider Location: Home Office  I discussed the limitations of evaluation and management by telemedicine. The patient expressed understanding and agreed to proceed.  Vital Signs: Because this visit was a virtual/telehealth visit, some criteria may be missing or patient reported. Any vitals not documented were not able to be obtained and vitals that have been documented are patient reported.  VideoDeclined- This patient declined Librarian, academic. Therefore the visit was completed with audio only.  Persons Participating in Visit: Patient.  AWV Questionnaire: Yes: Patient Medicare AWV questionnaire was completed by the patient on 12/20/23; I have confirmed that all information answered by patient is correct and no changes since this date.  Cardiac Risk Factors include: advanced age (>42men, >81 women);diabetes mellitus;male gender;dyslipidemia;hypertension     Objective:    Today's Vitals   12/21/23 1023  BP: 138/68  Pulse: (!) 59  Weight: 189 lb (85.7 kg)  Height: 5' 9 (1.753 m)   Body mass index is 27.91 kg/m.     12/21/2023   10:43 AM 08/13/2023    1:07 PM 12/16/2022   10:45 AM 12/01/2021   10:32 AM 11/27/2020    3:56 PM 10/12/2020    7:48 PM 01/20/2020    3:00 AM  Advanced Directives  Does Patient Have a Medical Advance Directive? Yes Yes Yes Yes Yes No No  Type of Estate agent of  [place];Living will Healthcare Power of [place];Living will Healthcare Power of [place];Living will Healthcare Power of [place];Living will Healthcare Power of [place];Living will    Does patient want to make changes to medical advance directive?    No - Patient declined No - Patient declined    Copy of Healthcare Power of Attorney in Chart? No - copy requested  No - copy requested Yes - validated most recent copy scanned in chart (See row information) Yes - validated most recent copy scanned in chart (See row information)    Would patient like information on creating a medical advance directive?      No - Patient declined No - Patient declined    Current Medications (verified) Outpatient Encounter Medications as of 12/21/2023  Medication Sig   Ascorbic Acid (VITAMIN C) 1000 MG tablet Take 1,000 mg by mouth daily at 2 am. Reported on 09/26/2015   aspirin  EC 81 MG tablet Take 81 mg by mouth daily at 2 am. Swallow whole.   Berberine Chloride (BERBERINE HCI PO) Take by mouth in the morning and at bedtime.   cholecalciferol (VITAMIN D ) 25 MCG (1000 UNIT) tablet Take 1,000 Units by mouth daily at 2 am.   ciprofloxacin  (CIPRO ) 500 MG tablet Take 1 tablet (500 mg total) by mouth 2 (two) times daily. (Patient taking differently: Take 500 mg by mouth 2 (two) times daily as needed.)   clopidogrel  (PLAVIX ) 75 MG tablet Take 1 tablet (75 mg total) by mouth daily.   Coenzyme Q10 (CO Q-10 PO) Take 1 capsule by mouth daily at  2 am.   dicyclomine  (BENTYL ) 10 MG capsule Take 1 capsule (10 mg total) by mouth 3 (three) times daily before meals.   dutasteride  (AVODART ) 0.5 MG capsule Take 1 capsule (0.5 mg total) by mouth daily.   gabapentin  (NEURONTIN ) 300 MG capsule Take 1 capsule (300 mg total) by mouth at bedtime.   GINKGO BILOBA PO Take 1 capsule by mouth daily at 2 am.    MAGNESIUM  PO Take 1 tablet by mouth daily at 2 am.   Multiple Vitamin (MULTIVITAMIN WITH MINERALS) TABS tablet Take 1 tablet by mouth  daily at 2 am.   niacin  250 MG tablet Take 250 mg by mouth daily.   nitroGLYCERIN  (NITROSTAT ) 0.4 MG SL tablet Place 1 tablet (0.4 mg total) under the tongue every 5 (five) minutes x 3 doses as needed.   Omega-3 Fatty Acids (FISH OIL) 1000 MG CAPS Take 2,000 mg by mouth daily at 2 am.    pantoprazole  (PROTONIX ) 40 MG tablet Take 1 tablet (40 mg total) by mouth daily.   ramipril  (ALTACE ) 10 MG capsule Take 1 capsule (10 mg total) by mouth daily.   rosuvastatin  (CRESTOR ) 40 MG tablet Take 1 tablet (40 mg total) by mouth at bedtime.   tamsulosin  (FLOMAX ) 0.4 MG CAPS capsule Take 1 capsule (0.4 mg total) by mouth daily at 2 am.   Turmeric (QC TUMERIC COMPLEX PO) Take by mouth daily.   VANADIUM PO Take 2 mg by mouth in the morning and at bedtime.   vitamin E 100 UNIT capsule Take 100 Units by mouth daily at 2 am.    ezetimibe  (ZETIA ) 10 MG tablet Take 1 tablet (10 mg total) by mouth daily. (Patient not taking: Reported on 12/21/2023)   isosorbide  mononitrate (IMDUR ) 30 MG 24 hr tablet Take 1 tablet (30 mg total) by mouth daily. (Patient not taking: Reported on 12/21/2023)   No facility-administered encounter medications on file as of 12/21/2023.    Allergies (verified) Darvocet [propoxyphene n-acetaminophen ] and Propoxyphene   History: Past Medical History:  Diagnosis Date   Acute cholecystitis 04/18/2013   Adenomatous colon polyp 12/2004   Adenomatous colon polyp 12/16/2004   Anemia    Anemia    Blood transfusion without reported diagnosis    BPH associated with nocturia    CAD (coronary artery disease)    a. s/p CABG x4 in 2000 with LIMA-LAD, reverse SVG-IM, reverse SVG-acute margin and reverse SVG-RCA b. s/p BMS to SVG-RI in 2012 c. 12/2016: PCI/DES to SVG-RCA and PCI/DES to SVG-RI (Dr. Lavona)    Carotid artery stenosis    mild    Cataract    Cervical radiculopathy 03/26/2020   Clotting disorder (HCC)    COLONIC POLYPS, HX OF 09/30/2006   Qualifier: Diagnosis of   By: Tish MD,  William          Polypectomy 2004, 2009; Dr Aneita         Coronary atherosclerosis of artery bypass graft 09/30/2006   Qualifier: Diagnosis of   By: Tish MD, William       Diabetes mellitus without complication (HCC) 09/28/2016   recent A1C 6.1 12/25/16 controlled with diet and exercise    Diverticulitis 2008/2009   Diverticulitis    Dizziness 04/21/2020   Educated about COVID-19 virus infection 10/06/2018   GERD 02/22/2007   Qualifier: Diagnosis of   By: Tish MD, Elsie       GERD (gastroesophageal reflux disease)    controlled as of 04/01/17    HSV infection  HTN (hypertension)    HTN (hypertension) 09/30/2006   Qualifier: Diagnosis of   By: Tish MD, William       Hx of CABG 12/25/2020   Hx of dysplastic nevus 05/04/2018   upper back spinal    Hyperglycemia    Hyperlipidemia    Hyperlipidemia LDL goal <70 01/08/2017   Impingement syndrome of left shoulder 2016   Impingement syndrome of left shoulder 05/18/2014   Kidney cysts 06/20/2019   Low back pain 02/15/2019   Low testosterone     Dr Kassie, [place]   Myocardial infarction [facility])    NSTEMI (non-ST elevated myocardial infarction) (HCC) 12/25/2016   Osteoarthritis of right knee    With trace effusion   Peyronie disease    Piriformis syndrome of right side 09/22/2019   Prediabetes 03/29/2018   Prostatitis 12/23/2017   Right arm weakness 02/02/2020   RLS (restless legs syndrome) 08/15/2013   RLS (restless legs syndrome) 08/15/2013   Rotator cuff injury    right; at 48-50 years old.    Sinus bradycardia    Sinus bradycardia    Thrombocytopenia (HCC)    TIA (transient ischemic attack) 01/19/2020   Tubular adenoma of colon 06/26/2020   Type 2 diabetes mellitus without complication, without long-term current use of insulin  (HCC) 09/28/2016   Unstable angina (HCC) 12/01/2019   Vitamin D  deficiency    Past Surgical History:  Procedure Laterality Date   CHOLECYSTECTOMY N/A 04/21/2013   Procedure:  LAPAROSCOPIC CHOLECYSTECTOMY WITH INTRAOPERATIVE CHOLANGIOGRAM;  Surgeon: Sherlean JINNY Laughter, MD;  Location: [facility] OR;  Service: General;  Laterality: N/A;   CHOLECYSTECTOMY     COLONOSCOPY W/ POLYPECTOMY  2004 & 2009    X 2; Dr Aneita; due 2014   CORONARY ANGIOPLASTY     CORONARY ARTERY BYPASS GRAFT  04/1999   4 vessels   CORONARY STENT INTERVENTION N/A 12/28/2016   Procedure: CORONARY STENT INTERVENTION;  Surgeon: Court Dorn JINNY, MD;  Location: [facility] INVASIVE CV LAB;  Service: Cardiovascular;  Laterality: N/A;   CORONARY STENT PLACEMENT  2012   Plavix    LAPAROSCOPIC CHOLECYSTECTOMY  04/21/2013   Dr Laughter; acutecholecystitis with necrosis   LEFT HEART CATH AND CORS/GRAFTS ANGIOGRAPHY N/A 12/28/2016   Procedure: LEFT HEART CATH AND CORS/GRAFTS ANGIOGRAPHY;  Surgeon: Court Dorn JINNY, MD;  Location: [facility] INVASIVE CV LAB;  Service: Cardiovascular;  Laterality: N/A;   LEFT HEART CATH AND CORS/GRAFTS ANGIOGRAPHY N/A 12/01/2019   Procedure: LEFT HEART CATH AND CORS/GRAFTS ANGIOGRAPHY;  Surgeon: [place], Peter M, MD;  Location: [facility] INVASIVE CV LAB;  Service: Cardiovascular;  Laterality: N/A;   TRANSCAROTID ARTERY REVASCULARIZATION  Right 08/17/2023   Procedure: RIGHT TRANSCAROTID ARTERY REVASCULARIZATION (TCAR);  Surgeon: Pearline Norman RAMAN, MD;  Location: [facility] OR;  Service: Vascular;  Laterality: Right;   ULTRASOUND GUIDANCE FOR VASCULAR ACCESS Bilateral 08/17/2023   Procedure: ULTRASOUND GUIDANCE, FOR VASCULAR ACCESS;  Surgeon: Pearline Norman RAMAN, MD;  Location: [facility] OR;  Service: Vascular;  Laterality: Bilateral;   VASECTOMY     Family History  Problem Relation Age of Onset   Coronary artery disease Mother        CABG in 52s   CVA Mother        cns bleed from warfarin   Aneurysm Mother    Aortic aneurysm Father 74       ? abdominal   Heart attack Father 73   Cholecystitis Sister    Diabetes Brother    Diabetes Other        PGaunt  Cancer Neg Hx    Colon cancer Neg Hx    Prostate cancer Neg Hx     Esophageal cancer Neg Hx    Stomach cancer Neg Hx    Rectal cancer Neg Hx    Social History   Socioeconomic History   Marital status: Married    Spouse name: Not on file   Number of children: 1   Years of education: Not on file   Highest education level: Not on file  Occupational History   Occupation:      Comment: plumber  Tobacco Use   Smoking status: Never   Smokeless tobacco: Never  Vaping Use   Vaping status: Never Used  Substance and Sexual Activity   Alcohol use: Yes    Alcohol/week: 10.0 - 12.0 standard drinks of alcohol    Types: 10 - 12 Cans of beer per week    Comment: 2-3 beers + 2-3 whiskey per day (most days)   Drug use: No   Sexual activity: Yes    Birth control/protection: None  Other Topics Concern   Not on file  Social History Narrative   Remarried 2014   1 son age 21 as of 03/2017       Lives at home with spouse   Right handed   Caffeine: 1-2 cups/day (coffee, green tea)   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[place]    Social Drivers of Corporate investment banker Strain: Low Risk  (12/21/2023)   Overall Financial Resource Strain (CARDIA)    Difficulty of Paying Living Expenses: Not hard at all  Food Insecurity: No Food Insecurity (12/21/2023)   Hunger Vital Sign    Worried About Running Out of Food in the Last Year: Never true    Ran Out of Food in the Last Year: Never true  Transportation Needs: No Transportation Needs (12/21/2023)   PRAPARE - Administrator, Civil Service (Medical): No    Lack of Transportation (Non-Medical): No  Physical Activity: Inactive (12/21/2023)   Exercise Vital Sign    Days of Exercise per Week: 0 days    Minutes of Exercise per Session: 0 min  Stress: No Stress Concern Present (12/21/2023)   [employer] of Occupational Health - Occupational Stress Questionnaire    Feeling of Stress: Not at all  Social Connections: Moderately Integrated (12/21/2023)   Social Connection and Isolation Panel    Frequency of  Communication with Friends and Family: More than three times a week    Frequency of Social Gatherings with Friends and Family: More than three times a week    Attends Religious Services: Never    Database administrator or Organizations: Yes    Attends Engineer, structural: More than 4 times per year    Marital Status: Married    Tobacco Counseling Counseling given: Not Answered    Clinical Intake:  Pre-visit preparation completed: Yes  Pain : No/denies pain     BMI - recorded: 27.91 Nutritional Status: BMI 25 -29 Overweight Nutritional Risks: None Diabetes: No CBG done?:  (per patient FBS 120) CBG resulted in Enter/ Edit results?: No  Lab Results  Component Value Date   HGBA1C 6.0 (H) 08/14/2023   HGBA1C 6.4 (A) 05/05/2023   HGBA1C 6.5 10/27/2022     How often do you need to have someone help you when you read instructions, pamphlets, or other written materials from your doctor or pharmacy?: 1 - Never  Interpreter Needed?: No  Information entered by ::  R. Sherlin Sonier LPN   Activities of Daily Living     12/20/2023   11:11 AM 08/17/2023    6:51 AM  In your present state of health, do you have any difficulty performing the following activities:  Hearing? 0   Vision? 0   Difficulty concentrating or making decisions? 0   Walking or climbing stairs? 0   Dressing or bathing? 0   Doing errands, shopping? 0 0  Preparing Food and eating ? N   Using the Toilet? N   In the past six months, have you accidently leaked urine? N   Do you have problems with loss of bowel control? N   Managing your Medications? N   Managing your Finances? N   Housekeeping or managing your Housekeeping? N     Patient Care Team: Lavona Agent, MD as PCP - Cardiology (Cardiology) Lavona Agent, MD as Consulting Physician (Cardiology) Kassie Ozell SAUNDERS, MD as Consulting Physician (Urology) Aneita Gwendlyn DASEN, MD (Inactive) as Consulting Physician (Gastroenterology)  I have updated your  Care Teams any recent Medical Services you may have received from other providers in the past year.     Assessment:   This is a routine wellness examination for [place].  Hearing/Vision screen Hearing Screening - Comments:: No issues Vision Screening - Comments:: readers   Goals Addressed             This Visit's Progress    Patient Stated       Wants to exercise and travel some       Depression Screen     12/21/2023   10:39 AM 08/23/2023   11:17 AM 05/06/2023   11:35 AM 12/16/2022   10:40 AM 11/03/2022   10:34 AM 05/04/2022    1:00 PM 12/01/2021   10:08 AM  PHQ 2/9 Scores  PHQ - 2 Score 0 0 0 0 0 0 0  PHQ- 9 Score 1 2 0 0 0 0     Fall Risk     12/20/2023   11:11 AM 08/23/2023   11:17 AM 05/06/2023   11:35 AM 05/05/2023   11:15 AM 12/16/2022   10:36 AM  Fall Risk   Falls in the past year? 0 0 0 0 0  Number falls in past yr: 0 0 0 0 1  Injury with Fall? 0 0 0 0 0  Risk for fall due to : No Fall Risks No Fall Risks No Fall Risks No Fall Risks History of fall(s);Impaired balance/gait  Follow up Falls evaluation completed;Falls prevention discussed Falls evaluation completed;Education provided Falls evaluation completed Falls evaluation completed Falls prevention discussed;Falls evaluation completed    MEDICARE RISK AT HOME:  Medicare Risk at Home Any stairs in or around the home?: (Patient-Rptd) No If so, are there any without handrails?: (Patient-Rptd) No Home free of loose throw rugs in walkways, pet beds, electrical cords, etc?: (Patient-Rptd) Yes Adequate lighting in your home to reduce risk of falls?: (Patient-Rptd) Yes Life alert?: (Patient-Rptd) Yes Use of a cane, walker or w/c?: (Patient-Rptd) No Grab bars in the bathroom?: (Patient-Rptd) No Shower chair or bench in shower?: (Patient-Rptd) No Elevated toilet seat or a handicapped toilet?: (Patient-Rptd) Yes  TIMED UP AND GO:  Was the test performed?  No  Cognitive Function: 6CIT completed    11/11/2017    10:21 AM  MMSE - Mini Mental State Exam  Orientation to time 5  Orientation to Place 5  Registration 3  Attention/ Calculation 5  Recall 2  Language- name  2 objects 2  Language- repeat 1  Language- follow 3 step command 3  Language- read & follow direction 1  Write a sentence 1  Copy design 1  Total score 29        12/21/2023   10:44 AM 12/16/2022   10:45 AM 11/15/2018   10:42 AM  6CIT Screen  What Year? 0 points 0 points 0 points  What month? 0 points 0 points 0 points  What time? 0 points 0 points 0 points  Count back from 20 0 points 0 points 0 points  Months in reverse 0 points 0 points 0 points  Repeat phrase 0 points 2 points 0 points  Total Score 0 points 2 points 0 points    Immunizations Immunization History  Administered Date(s) Administered   Fluad Quad(high Dose 65+) 03/26/2020   Influenza, High Dose Seasonal PF 01/25/2015, 03/30/2016, 04/01/2017, 03/29/2018   Influenza,inj,Quad PF,6+ Mos 04/25/2014   Influenza,inj,quad, With Preservative 02/15/2018   Influenza-Unspecified 03/19/2019   Pneumococcal Conjugate-13 09/27/2015   Pneumococcal Polysaccharide-23 04/25/2014   Td 08/07/2008   Tdap 06/26/2020   Zoster, Live 02/27/2014, 03/21/2014    Screening Tests Health Maintenance  Topic Date Due   Diabetic kidney evaluation - Urine ACR  03/30/2019   OPHTHALMOLOGY EXAM  12/14/2021   Medicare Annual Wellness (AWV)  12/16/2023   INFLUENZA VACCINE  12/17/2023   HEMOGLOBIN A1C  02/14/2024   Diabetic kidney evaluation - eGFR measurement  08/22/2024   Colonoscopy  04/27/2026   DTaP/Tdap/Td (3 - Td or Tdap) 06/26/2030   Pneumococcal Vaccine: 50+ Years  Completed   Hepatitis C Screening  Completed   Hepatitis B Vaccines  Aged Out   HPV VACCINES  Aged Out   Meningococcal B Vaccine  Aged Out   FOOT EXAM  Discontinued   COVID-19 Vaccine  Discontinued   Zoster Vaccines- Shingrix  Discontinued    Health Maintenance  Health Maintenance Due  Topic Date Due    Diabetic kidney evaluation - Urine ACR  03/30/2019   OPHTHALMOLOGY EXAM  12/14/2021   Medicare Annual Wellness (AWV)  12/16/2023   INFLUENZA VACCINE  12/17/2023   Health Maintenance Items Addressed: Patient declines flu vaccine.  Additional Screening:  Vision Screening: Recommended annual ophthalmology exams for early detection of glaucoma and other disorders of the eye.  Up to date  [facility]. Up to date per patient. Will call for last office visit notes Would you like a referral to an eye doctor? No    Dental Screening: Recommended annual dental exams for proper oral hygiene  Community Resource Referral / Chronic Care Management: CRR required this visit?  No   CCM required this visit?  No   Plan:    I have personally reviewed and noted the following in the patient's chart:   Medical and social history Use of alcohol, tobacco or illicit drugs  Current medications and supplements including opioid prescriptions. Patient is not currently taking opioid prescriptions. Functional ability and status Nutritional status Physical activity Advanced directives List of other physicians Hospitalizations, surgeries, and ER visits in previous 12 months Vitals Screenings to include cognitive, depression, and falls Referrals and appointments  In addition, I have reviewed and discussed with patient certain preventive protocols, quality metrics, and best practice recommendations. A written personalized care plan for preventive services as well as general preventive health recommendations were provided to patient.   Angeline Fredericks, LPN   05/21/7972   After Visit Summary: (MyChart) Due to this being a telephonic visit,  the after visit summary with patients personalized plan was offered to patient via MyChart   Notes: Nothing significant to report at this time.  Called Dr. Margot office and requested most recent diabetic eye exam office notes.

## 2023-12-23 DIAGNOSIS — E113393 Type 2 diabetes mellitus with moderate nonproliferative diabetic retinopathy without macular edema, bilateral: Secondary | ICD-10-CM | POA: Diagnosis not present

## 2024-01-07 ENCOUNTER — Ambulatory Visit

## 2024-01-07 ENCOUNTER — Encounter (HOSPITAL_COMMUNITY)

## 2024-01-13 DIAGNOSIS — I4510 Unspecified right bundle-branch block: Secondary | ICD-10-CM | POA: Insufficient documentation

## 2024-01-13 NOTE — Progress Notes (Unsigned)
  Cardiology Office Note:   Date:  01/14/2024  ID:  Kevin Webb, DOB August 07, 1945, MRN [id number] PCP: No primary care provider on file.  [facility] Providers Cardiologist:  Lynwood Schilling, MD {  History of Present Illness:   Kevin Webb is a 78 y.o. male who presents for evaluation of CAD. Cardiac cath in 2021 demonstrated severe three-vessel coronary disease with an occluded SVG to the PDA, occluded SVG to the ramus intermediate. There was a LIMA to the LAD that was patent.    Since I saw him he was in the hospital with a CVA.  I reviewed these records.  This was in March.  CT angio of the head and neck showed a 65% right cervical ICA stenosis and 60% left ICA stenosis.  Transthoracic echo without significant findings.  He had remote lacunar infarcts of the left thalamus on CT.  MRI demonstrated 10 mm acute infarct in the right centrum semiovale.  The etiology was thought to be the right ICA stenosis and large vessel disease.  He subsequently had a right TCAR.  He has done well since that time.  He is back at work.  He denies any residual from the stroke. The patient denies any new symptoms such as chest discomfort, neck or arm discomfort. There has been no new shortness of breath, PND or orthopnea. There have been no reported palpitations, presyncope or syncope.   ROS: As stated in the HPI and negative for all other systems.  Studies Reviewed:    EKG:     EKG 08/13/2023 sinus rhythm, right bundle branch block, left intrafascicular block, rate 56, no acute ST-T wave changes.  Unchanged from EKG in August 2024    Risk Assessment/Calculations:              Physical Exam:   VS:  BP 120/73   Pulse (!) 54   Ht 5' 9 (1.753 m)   Wt 190 lb (86.2 kg)   SpO2 95%   BMI 28.06 kg/m    Wt Readings from Last 3 Encounters:  01/14/24 190 lb (86.2 kg)  12/21/23 189 lb (85.7 kg)  09/24/23 197 lb 1.6 oz (89.4 kg)     GEN: Well nourished, well developed in no acute distress NECK:  No JVD; No carotid bruits CARDIAC: RRR, no murmurs, rubs, gallops RESPIRATORY:  Clear to auscultation without rales, wheezing or rhonchi  ABDOMEN: Soft, non-tender, non-distended EXTREMITIES:  No edema; No deformity   ASSESSMENT AND PLAN:   CAD s/p CABG and subsequent PCI: Of note he does reduced on his Imdur  when he left the hospital but he was not  given a prescription for the 30 mg dose.   However, he has had no symptoms.  At this point he will remain off of this.  No change in therapy.  He let me know if he has recurrent symptoms.    Dyslipidemia: LDL was 40 with an HDL of 38.  Continue meds as listed.    RBBB:   He has bifascicular block but has had no symptoms related to this.  No change in therapy.   DM2:   A1c is 6.0.  No change in therapy.  CVA: He is status post TCAR as above.  I reviewed these records for this visit.  He is having aggressive risk reduction.  He has no residual symptoms.  No change in therapy.  Follow up with me in 1 year  Signed, Lynwood Schilling, MD

## 2024-01-14 ENCOUNTER — Ambulatory Visit: Attending: Cardiology | Admitting: Cardiology

## 2024-01-14 ENCOUNTER — Other Ambulatory Visit: Payer: BLUE CROSS/BLUE SHIELD

## 2024-01-14 ENCOUNTER — Encounter: Payer: Self-pay | Admitting: Cardiology

## 2024-01-14 VITALS — BP 120/73 | HR 54 | Ht 69.0 in | Wt 190.0 lb

## 2024-01-14 DIAGNOSIS — I25118 Atherosclerotic heart disease of native coronary artery with other forms of angina pectoris: Secondary | ICD-10-CM | POA: Insufficient documentation

## 2024-01-14 DIAGNOSIS — I4510 Unspecified right bundle-branch block: Secondary | ICD-10-CM | POA: Insufficient documentation

## 2024-01-14 DIAGNOSIS — E118 Type 2 diabetes mellitus with unspecified complications: Secondary | ICD-10-CM | POA: Diagnosis not present

## 2024-01-14 DIAGNOSIS — E785 Hyperlipidemia, unspecified: Secondary | ICD-10-CM | POA: Diagnosis not present

## 2024-01-14 NOTE — Patient Instructions (Signed)
 Medication Instructions:  Your physician recommends that you continue on your current medications as directed. Please refer to the Current Medication list given to you today.  *If you need a refill on your cardiac medications before your next appointment, please call your pharmacy*  Lab Work: NONE If you have labs (blood work) drawn today and your tests are completely normal, you will receive your results only by: MyChart Message (if you have MyChart) OR A paper copy in the mail If you have any lab test that is abnormal or we need to change your treatment, we will call you to review the results.  Testing/Procedures: NONE  Follow-Up: At [facility], you and your health needs are our priority.  As part of our continuing mission to provide you with exceptional heart care, our providers are all part of one team.  This team includes your primary Cardiologist (physician) and Advanced Practice Providers or APPs (Physician Assistants and Nurse Practitioners) who all work together to provide you with the care you need, when you need it.  Your next appointment:   1 year  Provider:   Lavonne Prairie, MD  We recommend signing up for the patient portal called MyChart.  Sign up information is provided on this After Visit Summary.  MyChart is used to connect with patients for Virtual Visits (Telemedicine).  Patients are able to view lab/test results, encounter notes, upcoming appointments, etc.  Non-urgent messages can be sent to your provider as well.   To learn more about what you can do with MyChart, go to [email or web address].

## 2024-01-18 ENCOUNTER — Ambulatory Visit: Payer: BLUE CROSS/BLUE SHIELD | Admitting: Physician Assistant

## 2024-01-18 ENCOUNTER — Other Ambulatory Visit: Payer: Self-pay | Admitting: Urology

## 2024-01-18 NOTE — Progress Notes (Unsigned)
 01/19/2024 8:38 PM   Kevin Webb [birth year] [id number]  Referring provider: Hope Merle, MD No address on file  Urological history: 1. BPH with LU TS -PSA (10/2022) 0.70 -Avodart  0.5 mg daily and tamsulosin  0.4 mg daily  2.  Hypogonadism -Contributing factors of age, diabetes and obesity -Discontinue therapy due to cardiac issues and did not feel clinical improvement  3.  Erectile dysfunction -Contributing factors of age, BPH, hypogonadism, diabetes, obesity, CAD, HLD, alcohol consumption and Peyronie's disease  -Not a candidate for PDE 5 inhibitors due to nitrate use  No chief complaint on file.  HPI: Kevin Webb is a 78 y.o. male who presents today for one year follow up.   Previous records reviewed.   Serum creatinine 0.89 with a EGFR of 83.72 and total cholesterol 96 in April.  TSH 2.880 and Hemoglobin A1c 6.0 in March  PMH: Past Medical History:  Diagnosis Date   Acute cholecystitis 04/18/2013   Adenomatous colon polyp 12/2004   Adenomatous colon polyp 12/16/2004   Anemia    Anemia    Blood transfusion without reported diagnosis    BPH associated with nocturia    CAD (coronary artery disease)    a. s/p CABG x4 in 2000 with LIMA-LAD, reverse SVG-IM, reverse SVG-acute margin and reverse SVG-RCA b. s/p BMS to SVG-RI in 2012 c. 12/2016: PCI/DES to SVG-RCA and PCI/DES to SVG-RI (Dr. Lavona)    Carotid artery stenosis    mild    Cataract    Cervical radiculopathy 03/26/2020   Clotting disorder (HCC)    COLONIC POLYPS, HX OF 09/30/2006   Qualifier: Diagnosis of   By: Tish MD, William          Polypectomy 2004, 2009; Dr Aneita         Coronary atherosclerosis of artery bypass graft 09/30/2006   Qualifier: Diagnosis of   By: Tish MD, William       Diabetes mellitus without complication (HCC) 09/28/2016   recent A1C 6.1 12/25/16 controlled with diet and exercise    Diverticulitis 2008/2009   Diverticulitis    Dizziness 04/21/2020   Educated about  COVID-19 virus infection 10/06/2018   GERD 02/22/2007   Qualifier: Diagnosis of   By: Tish MD, Elsie       GERD (gastroesophageal reflux disease)    controlled as of 04/01/17    HSV infection    HTN (hypertension)    HTN (hypertension) 09/30/2006   Qualifier: Diagnosis of   By: Tish MD, William       Hx of CABG 12/25/2020   Hx of dysplastic nevus 05/04/2018   upper back spinal    Hyperglycemia    Hyperlipidemia    Hyperlipidemia LDL goal <70 01/08/2017   Impingement syndrome of left shoulder 2016   Impingement syndrome of left shoulder 05/18/2014   Kidney cysts 06/20/2019   Low back pain 02/15/2019   Low testosterone     Dr Kassie, [facility] ,[place]   Myocardial infarction [facility])    NSTEMI (non-ST elevated myocardial infarction) (HCC) 12/25/2016   Osteoarthritis of right knee    With trace effusion   Peyronie disease    Piriformis syndrome of right side 09/22/2019   Prediabetes 03/29/2018   Prostatitis 12/23/2017   Right arm weakness 02/02/2020   RLS (restless legs syndrome) 08/15/2013   RLS (restless legs syndrome) 08/15/2013   Rotator cuff injury    right; at 65-77 years old.    Sinus bradycardia    Sinus bradycardia  Thrombocytopenia (HCC)    TIA (transient ischemic attack) 01/19/2020   Tubular adenoma of colon 06/26/2020   Type 2 diabetes mellitus without complication, without long-term current use of insulin  (HCC) 09/28/2016   Unstable angina (HCC) 12/01/2019   Vitamin D  deficiency     Surgical History: Past Surgical History:  Procedure Laterality Date   CHOLECYSTECTOMY N/A 04/21/2013   Procedure: LAPAROSCOPIC CHOLECYSTECTOMY WITH INTRAOPERATIVE CHOLANGIOGRAM;  Surgeon: Sherlean JINNY Laughter, MD;  Location: [facility] OR;  Service: General;  Laterality: N/A;   CHOLECYSTECTOMY     COLONOSCOPY W/ POLYPECTOMY  2004 & 2009    X 2; Dr Aneita; due 2014   CORONARY ANGIOPLASTY     CORONARY ARTERY BYPASS GRAFT  04/1999   4 vessels   CORONARY STENT INTERVENTION N/A 12/28/2016    Procedure: CORONARY STENT INTERVENTION;  Surgeon: Court Dorn JINNY, MD;  [facility]: [facility] INVASIVE CV LAB;  Service: Cardiovascular;  Laterality: N/A;   CORONARY STENT PLACEMENT  2012   Plavix    LAPAROSCOPIC CHOLECYSTECTOMY  04/21/2013   Dr Laughter; acutecholecystitis with necrosis   LEFT HEART CATH AND CORS/GRAFTS ANGIOGRAPHY N/A 12/28/2016   Procedure: LEFT HEART CATH AND CORS/GRAFTS ANGIOGRAPHY;  Surgeon: Court Dorn JINNY, MD;  [facility] INVASIVE CV LAB;  Service: Cardiovascular;  Laterality: N/A;   LEFT HEART CATH AND CORS/GRAFTS ANGIOGRAPHY N/A 12/01/2019   Procedure: LEFT HEART CATH AND CORS/GRAFTS ANGIOGRAPHY;  Surgeon: Swaziland, Peter M, MD;  Location: [facility] INVASIVE CV LAB;  Service: Cardiovascular;  Laterality: N/A;   TRANSCAROTID ARTERY REVASCULARIZATION  Right 08/17/2023   Procedure: RIGHT TRANSCAROTID ARTERY REVASCULARIZATION (TCAR);  Surgeon: Pearline Norman RAMAN, MD;  Location: [facility] OR;  Service: Vascular;  Laterality: Right;   ULTRASOUND GUIDANCE FOR VASCULAR ACCESS Bilateral 08/17/2023   Procedure: ULTRASOUND GUIDANCE, FOR VASCULAR ACCESS;  Surgeon: Pearline Norman RAMAN, MD;  Location: [facility] OR;  Service: Vascular;  Laterality: Bilateral;   VASECTOMY      Home Medications:  Allergies as of 01/19/2024       Reactions   Darvocet [propoxyphene N-acetaminophen ] Hives   Propoxyphene Hives        Medication List        Accurate as of January 18, 2024  8:38 PM. If you have any questions, ask your nurse or doctor.          aspirin  EC 81 MG tablet Take 81 mg by mouth daily at 2 am. Swallow whole.   BERBERINE HCI PO Take by mouth in the morning and at bedtime.   cholecalciferol 25 MCG (1000 UNIT) tablet Commonly known as: VITAMIN D3 Take 1,000 Units by mouth daily at 2 am.   ciprofloxacin  500 MG tablet Commonly known as: CIPRO  Take 1 tablet (500 mg total) by mouth 2 (two) times daily. What changed:  when to take this reasons to take this   clopidogrel  75 MG  tablet Commonly known as: PLAVIX  Take 1 tablet (75 mg total) by mouth daily.   CO Q-10 PO Take 1 capsule by mouth daily at 2 am.   dicyclomine  10 MG capsule Commonly known as: BENTYL  Take 1 capsule (10 mg total) by mouth 3 (three) times daily before meals.   dutasteride  0.5 MG capsule Commonly known as: AVODART  Take 1 capsule (0.5 mg total) by mouth daily.   ezetimibe  10 MG tablet Commonly known as: ZETIA  Take 1 tablet (10 mg total) by mouth daily.   Fish Oil 1000 MG Caps Take 2,000 mg by mouth daily at 2 am.   gabapentin  300 MG capsule  Commonly known as: NEURONTIN  Take 1 capsule (300 mg total) by mouth at bedtime.   GINKGO BILOBA PO Take 1 capsule by mouth daily at 2 am.   isosorbide  mononitrate 30 MG 24 hr tablet Commonly known as: IMDUR  Take 1 tablet (30 mg total) by mouth daily.   MAGNESIUM  PO Take 1 tablet by mouth daily at 2 am.   multivitamin with minerals Tabs tablet Take 1 tablet by mouth daily at 2 am.   niacin  250 MG tablet Commonly known as: VITAMIN B3 Take 250 mg by mouth daily.   nitroGLYCERIN  0.4 MG SL tablet Commonly known as: NITROSTAT  Place 1 tablet (0.4 mg total) under the tongue every 5 (five) minutes x 3 doses as needed.   pantoprazole  40 MG tablet Commonly known as: PROTONIX  Take 1 tablet (40 mg total) by mouth daily.   QC TUMERIC COMPLEX PO Take by mouth daily.   ramipril  10 MG capsule Commonly known as: ALTACE  Take 1 capsule (10 mg total) by mouth daily.   rosuvastatin  40 MG tablet Commonly known as: CRESTOR  Take 1 tablet (40 mg total) by mouth at bedtime.   tamsulosin  0.4 MG Caps capsule Commonly known as: FLOMAX  TAKE 1 CAPSULE BY MOUTH ONCE DAILY   VANADIUM PO Take 2 mg by mouth in the morning and at bedtime.   vitamin C 1000 MG tablet Take 1,000 mg by mouth daily at 2 am. Reported on 09/26/2015   vitamin E 45 MG (100 UNITS) capsule Take 100 Units by mouth daily at 2 am.        Allergies:  Allergies  Allergen  Reactions   Darvocet [Propoxyphene N-Acetaminophen ] Hives   Propoxyphene Hives    Family History: Family History  Problem Relation Age of Onset   Coronary artery disease Mother        CABG in 53s   CVA Mother        cns bleed from warfarin   Aneurysm Mother    Aortic aneurysm Father 56       ? abdominal   Heart attack Father 57   Cholecystitis Sister    Diabetes Brother    Diabetes Other        PGaunt   Cancer Neg Hx    Colon cancer Neg Hx    Prostate cancer Neg Hx    Esophageal cancer Neg Hx    Stomach cancer Neg Hx    Rectal cancer Neg Hx     Social History:  reports that he has never smoked. He has never used smokeless tobacco. He reports current alcohol use of about 10.0 - 12.0 standard drinks of alcohol per week. He reports that he does not use drugs.  ROS: Pertinent ROS in HPI  Physical Exam: There were no vitals taken for this visit.  Constitutional:  Well nourished. Alert and oriented, No acute distress. HEENT: [place] AT, moist mucus membranes.  Trachea midline, no masses. Cardiovascular: No clubbing, cyanosis, or edema. Respiratory: Normal respiratory effort, no increased work of breathing. GI: Abdomen is soft, non tender, non distended, no abdominal masses. Liver and spleen not palpable.  No hernias appreciated.  Stool sample for occult testing is not indicated.   GU: No CVA tenderness.  No bladder fullness or masses.  Patient with circumcised/uncircumcised phallus. ***Foreskin easily retracted***  Urethral meatus is patent.  No penile discharge. No penile lesions or rashes. Scrotum without lesions, cysts, rashes and/or edema.  Testicles are located scrotally bilaterally. No masses are appreciated in the testicles. Left and  right epididymis are normal. Rectal: Patient with  normal sphincter tone. Anus and perineum without scarring or rashes. No rectal masses are appreciated. Prostate is approximately *** grams, *** nodules are appreciated. Seminal vesicles are  normal. Skin: No rashes, bruises or suspicious lesions. Lymph: No cervical or inguinal adenopathy. Neurologic: Grossly intact, no focal deficits, moving all 4 extremities. Psychiatric: Normal mood and affect.   Laboratory data See EPIC and HPI I have reviewed the labs.  See HPI.     Pertinent imaging N/A  Assessment & Plan:    1. BPH with LU TS -He stated he asked his PCP to follow his PSA and BPH and they were agreeable -He will return to us  if he should have significant symptoms, an increase in PSA or if his PCP feels he needs to be seen by us   No follow-ups on file.  These notes generated with voice recognition software. I apologize for typographical errors.  Kevin Webb  [facility] [address]  [address] [place], [place] [address] [phone]

## 2024-01-19 ENCOUNTER — Encounter: Payer: Self-pay | Admitting: Urology

## 2024-01-19 ENCOUNTER — Ambulatory Visit (INDEPENDENT_AMBULATORY_CARE_PROVIDER_SITE_OTHER): Payer: BLUE CROSS/BLUE SHIELD | Admitting: Urology

## 2024-01-19 VITALS — BP 148/87 | HR 69 | Ht 69.0 in | Wt 189.0 lb

## 2024-01-19 DIAGNOSIS — N138 Other obstructive and reflux uropathy: Secondary | ICD-10-CM

## 2024-01-19 DIAGNOSIS — N401 Enlarged prostate with lower urinary tract symptoms: Secondary | ICD-10-CM | POA: Diagnosis not present

## 2024-01-21 ENCOUNTER — Ambulatory Visit (HOSPITAL_COMMUNITY)
Admission: RE | Admit: 2024-01-21 | Discharge: 2024-01-21 | Disposition: A | Discharge status: Home / Self Care | Source: Ambulatory Visit | Attending: Vascular Surgery | Admitting: Vascular Surgery

## 2024-01-21 ENCOUNTER — Encounter: Payer: Self-pay | Admitting: Physician Assistant

## 2024-01-21 ENCOUNTER — Ambulatory Visit

## 2024-01-21 VITALS — BP 148/88 | HR 73 | Temp 97.8°F | Wt 189.0 lb

## 2024-01-21 DIAGNOSIS — I6521 Occlusion and stenosis of right carotid artery: Secondary | ICD-10-CM

## 2024-01-21 DIAGNOSIS — I6522 Occlusion and stenosis of left carotid artery: Secondary | ICD-10-CM | POA: Diagnosis not present

## 2024-01-21 DIAGNOSIS — Z9889 Other specified postprocedural states: Secondary | ICD-10-CM | POA: Diagnosis not present

## 2024-01-21 DIAGNOSIS — Z95828 Presence of other vascular implants and grafts: Secondary | ICD-10-CM

## 2024-01-21 NOTE — Progress Notes (Signed)
 Office Note     CC:  follow up Requesting Provider:  Pearline Norman RAMAN, MD  HPI: Kevin Webb is a 78 y.o. (05/17/1946) male who presents for follow up of carotid artery stenosis. He is s/p right TCAR on 08/17/23 by Dr. Pearline.  This was for symptomatic stenosis. He also has known left ICA stenosis of about 60%. At his last visit he was doing very well with no new neurological symptoms  Today he reports overall he has been feeling really well. He denies any visual changes, slurred speech, dizziness, confusion, facial droop, unilateral upper or lower extremity weakness or numbness. He denies any pain in his legs on ambulation or rest.  No tissue loss. 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 Says he gets between 8-12K steps per day. He is medically managed on Aspirin , Statin and Plavix .   Past Medical History:  Diagnosis Date   Acute cholecystitis 04/18/2013   Adenomatous colon polyp 12/2004   Adenomatous colon polyp 12/16/2004   Anemia    Anemia    Blood transfusion without reported diagnosis    BPH associated with nocturia    CAD (coronary artery disease)    a. s/p CABG x4 in 2000 with LIMA-LAD, reverse SVG-IM, reverse SVG-acute margin and reverse SVG-RCA b. s/p BMS to SVG-RI in 2012 c. 12/2016: PCI/DES to SVG-RCA and PCI/DES to SVG-RI (Dr. Lavona)    Carotid artery stenosis    mild    Cataract    Cervical radiculopathy 03/26/2020   Clotting disorder (HCC)    COLONIC POLYPS, HX OF 09/30/2006   Qualifier: Diagnosis of   By: Tish MD, William          Polypectomy 2004, 2009; Dr Aneita         Coronary atherosclerosis of artery bypass graft 09/30/2006   Qualifier: Diagnosis of   By: Tish MD, William       Diabetes mellitus without complication (HCC) 09/28/2016   recent A1C 6.1 12/25/16 controlled with diet and exercise    Diverticulitis 2008/2009   Diverticulitis    Dizziness 04/21/2020   Educated about COVID-19 virus infection 10/06/2018   GERD 02/22/2007   Qualifier: Diagnosis of    By: Tish MD, Elsie       GERD (gastroesophageal reflux disease)    controlled as of 04/01/17    HSV infection    HTN (hypertension)    HTN (hypertension) 09/30/2006   Qualifier: Diagnosis of   By: Tish MD, William       Hx of CABG 12/25/2020   Hx of dysplastic nevus 05/04/2018   upper back spinal    Hyperglycemia    Hyperlipidemia    Hyperlipidemia LDL goal <70 01/08/2017   Impingement syndrome of left shoulder 2016   Impingement syndrome of left shoulder 05/18/2014   Kidney cysts 06/20/2019   Low back pain 02/15/2019   Low testosterone     Dr Kassie, [facility] ,[place]   Myocardial infarction [facility])    NSTEMI (non-ST elevated myocardial infarction) (HCC) 12/25/2016   Osteoarthritis of right knee    With trace effusion   Peyronie disease    Piriformis syndrome of right side 09/22/2019   Prediabetes 03/29/2018   Prostatitis 12/23/2017   Right arm weakness 02/02/2020   RLS (restless legs syndrome) 08/15/2013   RLS (restless legs syndrome) 08/15/2013   Rotator cuff injury    right; at 89-69 years old.    Sinus bradycardia    Sinus bradycardia    Thrombocytopenia (HCC)  TIA (transient ischemic attack) 01/19/2020   Tubular adenoma of colon 06/26/2020   Type 2 diabetes mellitus without complication, without long-term current use of insulin  (HCC) 09/28/2016   Unstable angina (HCC) 12/01/2019   Vitamin D  deficiency     Past Surgical History:  Procedure Laterality Date   CHOLECYSTECTOMY N/A 04/21/2013   Procedure: LAPAROSCOPIC CHOLECYSTECTOMY WITH INTRAOPERATIVE CHOLANGIOGRAM;  Surgeon: Sherlean JINNY Laughter, MD;  Location: [facility] OR;  Service: General;  Laterality: N/A;   CHOLECYSTECTOMY     COLONOSCOPY W/ POLYPECTOMY  2004 & 2009    X 2; Dr Aneita; due 2014   CORONARY ANGIOPLASTY     CORONARY ARTERY BYPASS GRAFT  04/1999   4 vessels   CORONARY STENT INTERVENTION N/A 12/28/2016   Procedure: CORONARY STENT INTERVENTION;  Surgeon: Court Dorn JINNY, MD;  [facility]: [facility] INVASIVE CV  LAB;  Service: Cardiovascular;  Laterality: N/A;   CORONARY STENT PLACEMENT  2012   Plavix    LAPAROSCOPIC CHOLECYSTECTOMY  04/21/2013   Dr Laughter; acutecholecystitis with necrosis   LEFT HEART CATH AND CORS/GRAFTS ANGIOGRAPHY N/A 12/28/2016   Procedure: LEFT HEART CATH AND CORS/GRAFTS ANGIOGRAPHY;  Surgeon: Court Dorn JINNY, MD;  [facility]: [facility] INVASIVE CV LAB;  Service: Cardiovascular;  Laterality: N/A;   LEFT HEART CATH AND CORS/GRAFTS ANGIOGRAPHY N/A 12/01/2019   Procedure: LEFT HEART CATH AND CORS/GRAFTS ANGIOGRAPHY;  Surgeon: Swaziland, Peter M, MD;  Location: [facility] INVASIVE CV LAB;  Service: Cardiovascular;  Laterality: N/A;   TRANSCAROTID ARTERY REVASCULARIZATION  Right 08/17/2023   Procedure: RIGHT TRANSCAROTID ARTERY REVASCULARIZATION (TCAR);  Surgeon: Pearline Norman RAMAN, MD;  Location: [facility] OR;  Service: Vascular;  Laterality: Right;   ULTRASOUND GUIDANCE FOR VASCULAR ACCESS Bilateral 08/17/2023   Procedure: ULTRASOUND GUIDANCE, FOR VASCULAR ACCESS;  Surgeon: Pearline Norman RAMAN, MD;  Location: [facility] OR;  Service: Vascular;  Laterality: Bilateral;   VASECTOMY      Social History   Socioeconomic History   Marital status: Married    Spouse name: Not on file   Number of children: 1   Years of education: Not on file   Highest education level: Not on file  Occupational History   Occupation:      Comment: plumber  Tobacco Use   Smoking status: Never   Smokeless tobacco: Never  Vaping Use   Vaping status: Never Used  Substance and Sexual Activity   Alcohol use: Yes    Alcohol/week: 10.0 - 12.0 standard drinks of alcohol    Types: 10 - 12 Cans of beer per week    Comment: 2-3 beers + 2-3 whiskey per day (most days)   Drug use: No   Sexual activity: Yes    Birth control/protection: None  Other Topics Concern   Not on file  Social History Narrative   Remarried 2014   1 son age 55 as of 03/2017       Lives at home with spouse   Right handed   Caffeine: 1-2 cups/day (coffee, green tea)    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[employer]    Social Drivers of Corporate investment banker Strain: Low Risk  (12/21/2023)   Overall Financial Resource Strain (CARDIA)    Difficulty of Paying Living Expenses: Not hard at all  Food Insecurity: No Food Insecurity (12/21/2023)   Hunger Vital Sign    Worried About Running Out of Food in the Last Year: Never true    Ran Out of Food in the Last Year: Never true  Transportation Needs: No Transportation Needs (12/21/2023)  PRAPARE - Administrator, Civil Service (Medical): No    Lack of Transportation (Non-Medical): No  Physical Activity: Inactive (12/21/2023)   Exercise Vital Sign    Days of Exercise per Week: 0 days    Minutes of Exercise per Session: 0 min  Stress: No Stress Concern Present (12/21/2023)   [employer] of Occupational Health - Occupational Stress Questionnaire    Feeling of Stress: Not at all  Social Connections: Moderately Integrated (12/21/2023)   Social Connection and Isolation Panel    Frequency of Communication with Friends and Family: More than three times a week    Frequency of Social Gatherings with Friends and Family: More than three times a week    Attends Religious Services: Never    Database administrator or Organizations: Yes    Attends Engineer, structural: More than 4 times per year    Marital Status: Married  Catering manager Violence: Not At Risk (12/21/2023)   Humiliation, Afraid, Rape, and Kick questionnaire    Fear of Current or Ex-Partner: No    Emotionally Abused: No    Physically Abused: No    Sexually Abused: No    Family History  Problem Relation Age of Onset   Coronary artery disease Mother        CABG in 55s   CVA Mother        cns bleed from warfarin   Aneurysm Mother    Aortic aneurysm Father 38       ? abdominal   Heart attack Father 8   Cholecystitis Sister    Diabetes Brother    Diabetes Other        PGaunt   Cancer Neg Hx    Colon cancer Neg Hx    Prostate cancer  Neg Hx    Esophageal cancer Neg Hx    Stomach cancer Neg Hx    Rectal cancer Neg Hx     Current Outpatient Medications  Medication Sig Dispense Refill   Ascorbic Acid (VITAMIN C) 1000 MG tablet Take 1,000 mg by mouth daily at 2 am. Reported on 09/26/2015     aspirin  EC 81 MG tablet Take 81 mg by mouth daily at 2 am. Swallow whole.     Berberine Chloride (BERBERINE HCI PO) Take by mouth in the morning and at bedtime.     cholecalciferol (VITAMIN D ) 25 MCG (1000 UNIT) tablet Take 1,000 Units by mouth daily at 2 am.     ciprofloxacin  (CIPRO ) 500 MG tablet Take 1 tablet (500 mg total) by mouth 2 (two) times daily. (Patient taking differently: Take 500 mg by mouth 2 (two) times daily as needed.) 20 tablet 2   clopidogrel  (PLAVIX ) 75 MG tablet Take 1 tablet (75 mg total) by mouth daily. 90 tablet 3   Coenzyme Q10 (CO Q-10 PO) Take 1 capsule by mouth daily at 2 am.     dicyclomine  (BENTYL ) 10 MG capsule Take 1 capsule (10 mg total) by mouth 3 (three) times daily before meals. 270 capsule 3   dutasteride  (AVODART ) 0.5 MG capsule Take 1 capsule (0.5 mg total) by mouth daily. 90 capsule 3   ezetimibe  (ZETIA ) 10 MG tablet Take 1 tablet (10 mg total) by mouth daily. 90 tablet 3   gabapentin  (NEURONTIN ) 300 MG capsule Take 1 capsule (300 mg total) by mouth at bedtime. 90 capsule 3   GINKGO BILOBA PO Take 1 capsule by mouth daily at 2 am.  MAGNESIUM  PO Take 1 tablet by mouth daily at 2 am.     Multiple Vitamin (MULTIVITAMIN WITH MINERALS) TABS tablet Take 1 tablet by mouth daily at 2 am.     niacin  250 MG tablet Take 250 mg by mouth daily.     nitroGLYCERIN  (NITROSTAT ) 0.4 MG SL tablet Place 1 tablet (0.4 mg total) under the tongue every 5 (five) minutes x 3 doses as needed. 25 tablet 3   Omega-3 Fatty Acids (FISH OIL) 1000 MG CAPS Take 2,000 mg by mouth daily at 2 am.      pantoprazole  (PROTONIX ) 40 MG tablet Take 1 tablet (40 mg total) by mouth daily. 90 tablet 3   ramipril  (ALTACE ) 10 MG capsule  Take 1 capsule (10 mg total) by mouth daily. 90 capsule 3   rosuvastatin  (CRESTOR ) 40 MG tablet Take 1 tablet (40 mg total) by mouth at bedtime. 90 tablet 3   tamsulosin  (FLOMAX ) 0.4 MG CAPS capsule TAKE 1 CAPSULE BY MOUTH ONCE DAILY 90 capsule 2   Turmeric (QC TUMERIC COMPLEX PO) Take by mouth daily.     VANADIUM PO Take 2 mg by mouth in the morning and at bedtime.     vitamin E 100 UNIT capsule Take 100 Units by mouth daily at 2 am.      No current facility-administered medications for this visit.    Allergies  Allergen Reactions   Darvocet [Propoxyphene N-Acetaminophen ] Hives   Propoxyphene Hives     REVIEW OF SYSTEMS:  Negative unless noted in HPI [X]  denotes positive finding, [ ]  denotes negative finding Cardiac  Comments:  Chest pain or chest pressure:    Shortness of breath upon exertion:    Short of breath when lying flat:    Irregular heart rhythm:        Vascular    Pain in calf, thigh, or hip brought on by ambulation:    Pain in feet at night that wakes you up from your sleep:     Blood clot in your veins:    Leg swelling:         Pulmonary    Oxygen at home:    Productive cough:     Wheezing:         Neurologic    Sudden weakness in arms or legs:     Sudden numbness in arms or legs:     Sudden onset of difficulty speaking or slurred speech:    Temporary loss of vision in one eye:     Problems with dizziness:         Gastrointestinal    Blood in stool:     Vomited blood:         Genitourinary    Burning when urinating:     Blood in urine:        Psychiatric    Major depression:         Hematologic    Bleeding problems:    Problems with blood clotting too easily:        Skin    Rashes or ulcers:        Constitutional    Fever or chills:      PHYSICAL EXAMINATION:  Vitals:   01/21/24 1214 01/21/24 1217  BP: 138/79 (!) 148/88  Pulse: 73   Temp: 97.8 F (36.6 C)   TempSrc: Temporal   Weight: 189 lb (85.7 kg)     General:  WDWN in  NAD; vital signs documented above Gait: Normal  HENT: WNL, normocephalic Pulmonary: normal non-labored breathing Cardiac: regular HR Abdomen: soft Vascular Exam/Pulses: 2+ radial and DP pulses bilaterally Extremities: without ischemic changes, without Gangrene , without cellulitis; without open wounds;  Musculoskeletal: no muscle wasting or atrophy  Neurologic: A&O X 3 Psychiatric:  The pt has Normal affect.   Non-Invasive Vascular Imaging:   VAS US  Carotid Duplex: Summary:  Right Carotid: Right stent is patent.   Left Carotid: Velocities in the left ICA are consistent with a 1-39% stenosis.   Vertebrals: Bilateral vertebral arteries demonstrate antegrade flow.  Subclavians: Normal flow hemodynamics were seen in bilateral subclavian arteries.    ASSESSMENT/PLAN:: 78 y.o. male here for follow up for carotid artery stenosis. e is s/p right TCAR on 08/17/23 by Dr. Pearline.  This was for symptomatic stenosis. He also has known left ICA stenosis of about 60%. He is without any new neurological symptoms. His duplex today shows patent right ICA stent following TCAR. Left on duplex only showing 1-39% stenosis on today's study. Normal flow in the vertebral and subclavian arteries bilaterally.  - Continue Aspirin , Statin, Plavix  - Reviewed signs and symptoms of TIA/ Stroke and he understand should this occur to seek immediate medical attention - Follow up in 1 year with repeat carotid duplex   Teretha Damme, PA-C [facility] [phone]  Clinic MD:   Pearline

## 2024-02-25 ENCOUNTER — Telehealth: Payer: Self-pay

## 2024-02-25 NOTE — Telephone Encounter (Signed)
 Spoke to patient in regards to symptoms of lightheadedness x 2weeks.  He states that he will stop taking K & Mag supplements.  He feels that his symptoms are hard to describe but possibly a sense of fullness that comes and goes.  No nausea, no dizziness.  Will be offered PA visit for next week.

## 2024-02-25 NOTE — Telephone Encounter (Signed)
 Attempted to return patient's call re: lightheaded x 2weeks.  Telephone number provided, [phone] not in service.

## 2024-02-28 ENCOUNTER — Ambulatory Visit: Attending: Surgery | Admitting: Physician Assistant

## 2024-02-28 VITALS — BP 136/80 | HR 60 | Temp 97.9°F | Wt 196.0 lb

## 2024-02-28 DIAGNOSIS — Z9889 Other specified postprocedural states: Secondary | ICD-10-CM | POA: Insufficient documentation

## 2024-02-28 DIAGNOSIS — Z95828 Presence of other vascular implants and grafts: Secondary | ICD-10-CM | POA: Insufficient documentation

## 2024-02-28 DIAGNOSIS — H8110 Benign paroxysmal vertigo, unspecified ear: Secondary | ICD-10-CM | POA: Diagnosis not present

## 2024-02-28 DIAGNOSIS — I6521 Occlusion and stenosis of right carotid artery: Secondary | ICD-10-CM | POA: Diagnosis not present

## 2024-02-28 DIAGNOSIS — I6522 Occlusion and stenosis of left carotid artery: Secondary | ICD-10-CM | POA: Insufficient documentation

## 2024-02-28 NOTE — Progress Notes (Signed)
 Office Note     CC:  follow up Requesting Provider:  Bair, Kalpana, MD  HPI: Kevin Webb is a 78 y.o. (March 27, 1946) male who was added as an urgent triage patient due to symptoms of dizziness.  He is status post right TCAR on 08/17/2023 by Dr. Pearline.  This was performed due to symptomatic right ICA stenosis.  He was seen in the office last month with carotid duplex demonstrating a widely patent right ICA.  He was noted to have 1 to 39% stenosis of the left ICA at that time.  Since the office visit he has had 3 separate episodes of dizziness.  Each episode was related to head positioning including looking up at ceiling tiles or rapidly looking left and right while driving.  The dizziness episode lasted about 30 seconds and he is able to continue with normal activity.  He denies any one-sided weakness slurred speech or changes in vision.  He is on aspirin , Plavix , statin daily.  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   Past Medical History:  Diagnosis Date   Acute cholecystitis 04/18/2013   Adenomatous colon polyp 12/2004   Adenomatous colon polyp 12/16/2004   Anemia    Anemia    Blood transfusion without reported diagnosis    BPH associated with nocturia    CAD (coronary artery disease)    a. s/p CABG x4 in 2000 with LIMA-LAD, reverse SVG-IM, reverse SVG-acute margin and reverse SVG-RCA b. s/p BMS to SVG-RI in 2012 c. 12/2016: PCI/DES to SVG-RCA and PCI/DES to SVG-RI (Dr. Lavona)    Carotid artery stenosis    mild    Cataract    Cervical radiculopathy 03/26/2020   Clotting disorder    COLONIC POLYPS, HX OF 09/30/2006   Qualifier: Diagnosis of   By: Tish MD, William          Polypectomy 2004, 2009; Dr Aneita         Coronary atherosclerosis of artery bypass graft 09/30/2006   Qualifier: Diagnosis of   By: Tish MD, William       Diabetes mellitus without complication (HCC) 09/28/2016   recent A1C 6.1 12/25/16  controlled with diet and exercise    Diverticulitis 2008/2009   Diverticulitis    Dizziness 04/21/2020   Educated about COVID-19 virus infection 10/06/2018   GERD 02/22/2007   Qualifier: Diagnosis of   By: Tish MD, Elsie       GERD (gastroesophageal reflux disease)    controlled as of 04/01/17    HSV infection    HTN (hypertension)    HTN (hypertension) 09/30/2006   Qualifier: Diagnosis of   By: Tish MD, William       Hx of CABG 12/25/2020   Hx of dysplastic nevus 05/04/2018   upper back spinal    Hyperglycemia    Hyperlipidemia    Hyperlipidemia LDL goal <70 01/08/2017   Impingement syndrome of left shoulder 2016   Impingement syndrome of left shoulder 05/18/2014   Kidney cysts 06/20/2019   Low back pain 02/15/2019   Low testosterone     Dr Kassie, [facility]   Myocardial infarction [facility])    NSTEMI (non-ST elevated myocardial infarction) (HCC) 12/25/2016   Osteoarthritis of right knee    With trace effusion   Peyronie disease    Piriformis syndrome of right side 09/22/2019   Prediabetes 03/29/2018   Prostatitis 12/23/2017   Right arm weakness 02/02/2020   RLS (  restless legs syndrome) 08/15/2013   RLS (restless legs syndrome) 08/15/2013   Rotator cuff injury    right; at 14-87 years old.    Sinus bradycardia    Sinus bradycardia    Thrombocytopenia    TIA (transient ischemic attack) 01/19/2020   Tubular adenoma of colon 06/26/2020   Type 2 diabetes mellitus without complication, without long-term current use of insulin  (HCC) 09/28/2016   Unstable angina (HCC) 12/01/2019   Vitamin D  deficiency     Past Surgical History:  Procedure Laterality Date   CHOLECYSTECTOMY N/A 04/21/2013   Procedure: LAPAROSCOPIC CHOLECYSTECTOMY WITH INTRAOPERATIVE CHOLANGIOGRAM;  Surgeon: Sherlean JINNY Laughter, MD;  Location: [facility] OR;  Service: General;  Laterality: N/A;   CHOLECYSTECTOMY     COLONOSCOPY W/ POLYPECTOMY  2004 & 2009    X 2; Dr Aneita; due 2014   CORONARY ANGIOPLASTY      CORONARY ARTERY BYPASS GRAFT  04/1999   4 vessels   CORONARY STENT INTERVENTION N/A 12/28/2016   Procedure: CORONARY STENT INTERVENTION;  Surgeon: Court Dorn JINNY, MD;  Location: [facility] INVASIVE CV LAB;  Service: Cardiovascular;  Laterality: N/A;   CORONARY STENT PLACEMENT  2012   Plavix    LAPAROSCOPIC CHOLECYSTECTOMY  04/21/2013   Dr Laughter; acutecholecystitis with necrosis   LEFT HEART CATH AND CORS/GRAFTS ANGIOGRAPHY N/A 12/28/2016   Procedure: LEFT HEART CATH AND CORS/GRAFTS ANGIOGRAPHY;  Surgeon: Court Dorn JINNY, MD;  Location: [facility] INVASIVE CV LAB;  Service: Cardiovascular;  Laterality: N/A;   LEFT HEART CATH AND CORS/GRAFTS ANGIOGRAPHY N/A 12/01/2019   Procedure: LEFT HEART CATH AND CORS/GRAFTS ANGIOGRAPHY;  Surgeon: [place]az[place], Peter M, MD;  Location: [facility] INVASIVE CV LAB;  Service: Cardiovascular;  Laterality: N/A;   TRANSCAROTID ARTERY REVASCULARIZATION  Right 08/17/2023   Procedure: RIGHT TRANSCAROTID ARTERY REVASCULARIZATION (TCAR);  Surgeon: Pearline Norman RAMAN, MD;  Location: [facility] OR;  Service: Vascular;  Laterality: Right;   ULTRASOUND GUIDANCE FOR VASCULAR ACCESS Bilateral 08/17/2023   Procedure: ULTRASOUND GUIDANCE, FOR VASCULAR ACCESS;  Surgeon: Pearline Norman RAMAN, MD;  Location: [facility] OR;  Service: Vascular;  Laterality: Bilateral;   VASECTOMY      Social History   Socioeconomic History   Marital status: Married    Spouse name: Not on file   Number of children: 1   Years of education: Not on file   Highest education level: Not on file  Occupational History   Occupation:      Comment: plumber  Tobacco Use   Smoking status: Never   Smokeless tobacco: Never  Vaping Use   Vaping status: Never Used  Substance and Sexual Activity   Alcohol use: Yes    Alcohol/week: 10.0 - 12.0 standard drinks of alcohol    Types: 10 - 12 Cans of beer per week    Comment: 2-3 beers + 2-3 whiskey per day (most days)   Drug use: No   Sexual activity: Yes    Birth control/protection: None  Other  Topics Concern   Not on file  Social History Narrative   Remarried 2014   1 son age 82 as of 03/2017       Lives at home with spouse   Right handed   Caffeine: 1-2 cups/day (coffee, green tea)   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[place]    Social Drivers of Corporate investment banker Strain: Low Risk  (12/21/2023)   Overall Financial Resource Strain (CARDIA)    Difficulty of Paying Living Expenses: Not hard at all  Food Insecurity: No Food Insecurity (12/21/2023)  Hunger Vital Sign    Worried About Running Out of Food in the Last Year: Never true    Ran Out of Food in the Last Year: Never true  Transportation Needs: No Transportation Needs (12/21/2023)   PRAPARE - Administrator, Civil Service (Medical): No    Lack of Transportation (Non-Medical): No  Physical Activity: Inactive (12/21/2023)   Exercise Vital Sign    Days of Exercise per Week: 0 days    Minutes of Exercise per Session: 0 min  Stress: No Stress Concern Present (12/21/2023)   [employer] of Occupational Health - Occupational Stress Questionnaire    Feeling of Stress: Not at all  Social Connections: Moderately Integrated (12/21/2023)   Social Connection and Isolation Panel    Frequency of Communication with Friends and Family: More than three times a week    Frequency of Social Gatherings with Friends and Family: More than three times a week    Attends Religious Services: Never    Database administrator or Organizations: Yes    Attends Engineer, structural: More than 4 times per year    Marital Status: Married  Catering manager Violence: Not At Risk (12/21/2023)   Humiliation, Afraid, Rape, and Kick questionnaire    Fear of Current or Ex-Partner: No    Emotionally Abused: No    Physically Abused: No    Sexually Abused: No    Family History  Problem Relation Age of Onset   Coronary artery disease Mother        CABG in 4s   CVA Mother        cns bleed from warfarin   Aneurysm Mother    Aortic  aneurysm Father 32       ? abdominal   Heart attack Father 41   Cholecystitis Sister    Diabetes Brother    Diabetes Other        PGaunt   Cancer Neg Hx    Colon cancer Neg Hx    Prostate cancer Neg Hx    Esophageal cancer Neg Hx    Stomach cancer Neg Hx    Rectal cancer Neg Hx     Current Outpatient Medications  Medication Sig Dispense Refill   Ascorbic Acid (VITAMIN C) 1000 MG tablet Take 1,000 mg by mouth daily at 2 am. Reported on 09/26/2015     aspirin  EC 81 MG tablet Take 81 mg by mouth daily at 2 am. Swallow whole.     Berberine Chloride (BERBERINE HCI PO) Take by mouth in the morning and at bedtime.     cholecalciferol (VITAMIN D ) 25 MCG (1000 UNIT) tablet Take 1,000 Units by mouth daily at 2 am.     ciprofloxacin  (CIPRO ) 500 MG tablet Take 1 tablet (500 mg total) by mouth 2 (two) times daily. (Patient taking differently: Take 500 mg by mouth 2 (two) times daily as needed.) 20 tablet 2   clopidogrel  (PLAVIX ) 75 MG tablet Take 1 tablet (75 mg total) by mouth daily. 90 tablet 3   Coenzyme Q10 (CO Q-10 PO) Take 1 capsule by mouth daily at 2 am.     dicyclomine  (BENTYL ) 10 MG capsule Take 1 capsule (10 mg total) by mouth 3 (three) times daily before meals. 270 capsule 3   dutasteride  (AVODART ) 0.5 MG capsule Take 1 capsule (0.5 mg total) by mouth daily. 90 capsule 3   ezetimibe  (ZETIA ) 10 MG tablet Take 1 tablet (10 mg total) by mouth daily. 90 tablet  3   gabapentin  (NEURONTIN ) 300 MG capsule Take 1 capsule (300 mg total) by mouth at bedtime. 90 capsule 3   GINKGO BILOBA PO Take 1 capsule by mouth daily at 2 am.      MAGNESIUM  PO Take 1 tablet by mouth daily at 2 am.     Multiple Vitamin (MULTIVITAMIN WITH MINERALS) TABS tablet Take 1 tablet by mouth daily at 2 am.     niacin  250 MG tablet Take 250 mg by mouth daily.     nitroGLYCERIN  (NITROSTAT ) 0.4 MG SL tablet Place 1 tablet (0.4 mg total) under the tongue every 5 (five) minutes x 3 doses as needed. 25 tablet 3   Omega-3 Fatty  Acids (FISH OIL) 1000 MG CAPS Take 2,000 mg by mouth daily at 2 am.      pantoprazole  (PROTONIX ) 40 MG tablet Take 1 tablet (40 mg total) by mouth daily. 90 tablet 3   ramipril  (ALTACE ) 10 MG capsule Take 1 capsule (10 mg total) by mouth daily. 90 capsule 3   rosuvastatin  (CRESTOR ) 40 MG tablet Take 1 tablet (40 mg total) by mouth at bedtime. 90 tablet 3   tamsulosin  (FLOMAX ) 0.4 MG CAPS capsule TAKE 1 CAPSULE BY MOUTH ONCE DAILY 90 capsule 2   Turmeric (QC TUMERIC COMPLEX PO) Take by mouth daily.     VANADIUM PO Take 2 mg by mouth in the morning and at bedtime.     vitamin E 100 UNIT capsule Take 100 Units by mouth daily at 2 am.      No current facility-administered medications for this visit.    Allergies  Allergen Reactions   Darvocet [Propoxyphene N-Acetaminophen ] Hives   Propoxyphene Hives     REVIEW OF SYSTEMS:  Negative unless noted in HPI [X]  denotes positive finding, [ ]  denotes negative finding Cardiac  Comments:  Chest pain or chest pressure:    Shortness of breath upon exertion:    Short of breath when lying flat:    Irregular heart rhythm:        Vascular    Pain in calf, thigh, or hip brought on by ambulation:    Pain in feet at night that wakes you up from your sleep:     Blood clot in your veins:    Leg swelling:         Pulmonary    Oxygen at home:    Productive cough:     Wheezing:         Neurologic    Sudden weakness in arms or legs:     Sudden numbness in arms or legs:     Sudden onset of difficulty speaking or slurred speech:    Temporary loss of vision in one eye:     Problems with dizziness:         Gastrointestinal    Blood in stool:     Vomited blood:         Genitourinary    Burning when urinating:     Blood in urine:        Psychiatric    Major depression:         Hematologic    Bleeding problems:    Problems with blood clotting too easily:        Skin    Rashes or ulcers:        Constitutional    Fever or chills:       PHYSICAL EXAMINATION:  Vitals:   02/28/24 0958  BP: 136/80  Pulse: 60  Temp: 97.9 F (36.6 C)  TempSrc: Temporal  Weight: 196 lb (88.9 kg)    General:  WDWN in NAD; vital signs documented above Gait: Not observed HENT: WNL, normocephalic Pulmonary: normal non-labored breathing Cardiac: regular HR Abdomen: soft, NT, no masses Skin: without rashes Vascular Exam/Pulses: palpable radial pulses Extremities: without ischemic changes, without Gangrene , without cellulitis; without open wounds;  Musculoskeletal: no muscle wasting or atrophy  Neurologic: A&O X 3; CN grossly intact Psychiatric:  The pt has Normal affect.   Non-Invasive Vascular Imaging:   Carotid duplex from last month demonstrates a widely patent right ICA stents.  1 to 39% stenosis of the left ICA    ASSESSMENT/PLAN:: 79 y.o. male who was added to clinic today as an urgent triage visit due to episodic dizziness related to head positioning  Kevin Webb is a 78 year old male who recently underwent right TCAR due to symptomatic right ICA stenosis.  He was seen in the office 1 month ago with a carotid duplex demonstrating a widely patent right ICA stents.  His left ICA stenosis was estimated to be between 1 and 39%.  He is experiencing episodic dizziness which comes on suddenly with certain head positioning.  He does not have any one-sided weakness, vision changes, headache, or other symptoms suggestive of a TIA or stroke.  He will be urgently referred to ENT for evaluation for possible vertigo.  He can continue his aspirin , Plavix , statin and follow-up with us  as scheduled in another year.   Donnice Sender, PA-C [facility] [phone]  Clinic MD:   [place]

## 2024-03-13 ENCOUNTER — Institutional Professional Consult (permissible substitution) (INDEPENDENT_AMBULATORY_CARE_PROVIDER_SITE_OTHER)

## 2024-03-17 ENCOUNTER — Encounter (INDEPENDENT_AMBULATORY_CARE_PROVIDER_SITE_OTHER): Payer: Self-pay

## 2024-03-29 ENCOUNTER — Ambulatory Visit

## 2024-04-10 ENCOUNTER — Ambulatory Visit (INDEPENDENT_AMBULATORY_CARE_PROVIDER_SITE_OTHER)

## 2024-04-10 DIAGNOSIS — Z808 Family history of malignant neoplasm of other organs or systems: Secondary | ICD-10-CM

## 2024-04-10 DIAGNOSIS — L821 Other seborrheic keratosis: Secondary | ICD-10-CM

## 2024-04-10 DIAGNOSIS — L578 Other skin changes due to chronic exposure to nonionizing radiation: Secondary | ICD-10-CM | POA: Diagnosis not present

## 2024-04-10 DIAGNOSIS — D229 Melanocytic nevi, unspecified: Secondary | ICD-10-CM

## 2024-04-10 DIAGNOSIS — Z1283 Encounter for screening for malignant neoplasm of skin: Secondary | ICD-10-CM | POA: Diagnosis not present

## 2024-04-10 DIAGNOSIS — D1801 Hemangioma of skin and subcutaneous tissue: Secondary | ICD-10-CM | POA: Diagnosis not present

## 2024-04-10 DIAGNOSIS — L409 Psoriasis, unspecified: Secondary | ICD-10-CM | POA: Diagnosis not present

## 2024-04-10 DIAGNOSIS — W908XXA Exposure to other nonionizing radiation, initial encounter: Secondary | ICD-10-CM | POA: Diagnosis not present

## 2024-04-10 DIAGNOSIS — L814 Other melanin hyperpigmentation: Secondary | ICD-10-CM

## 2024-04-10 DIAGNOSIS — L820 Inflamed seborrheic keratosis: Secondary | ICD-10-CM

## 2024-04-10 MED ORDER — CLOBETASOL PROPIONATE 0.05 % EX OINT
TOPICAL_OINTMENT | CUTANEOUS | 5 refills | Status: AC
Start: 2024-04-10 — End: ?

## 2024-04-10 NOTE — Patient Instructions (Addendum)
 Cryosurgery  Cryosurgery ("freezing") uses liquid nitrogen to destroy certain types of skin lesions. Lowering the temperature of the lesion in a small area surrounding skin destroys the lesion. Immediately following cryosurgery, you will notice redness and swelling of the treatment area. Blistering or weeping may occur, lasting approximately one week which will then be followed by crusting. Most areas will heal completely in 10 to 14 days.  Wash the treated areas daily. Allow soap and water to run over the areas, but do not scrub. Should a scab or crust form, allow it to fall off on its own. Do not remove or pick at it. Application of an ointment  and a bandage may make you feel more comfortable, but it is not necessary. Some people develop an allergy to Neosporin, so we recommend that Vaseline or  Aquaphor be used.  The cryotherapy site will be more sensitive than your surrounding skin. Keep it covered, and remember to apply sunscreen every day to all your sun exposed skin. A scar may remain which is lighter or pinker than your normal skin. Your body will continue to improve your scar for up to one year; however a light-colored scar may remain.  Infection following cryotherapy is rare. However if you are worried about the appearance of the treated area, contact your doctor. We have a physician on call at all times. If you have any concerns about the site, please call our clinic at [phone]  Melanoma ABCDEs  Melanoma is the most dangerous type of skin cancer, and is the leading cause of death from skin disease.  You are more likely to develop melanoma if you: Have light-colored skin, light-colored eyes, or red or blond hair Spend a lot of time in the sun Tan regularly, either outdoors or in a tanning bed Have had blistering sunburns, especially during childhood Have a close family member who has had a melanoma Have atypical moles or large birthmarks  Early detection of melanoma is key since  treatment is typically straightforward and cure rates are extremely high if we catch it early.   The first sign of melanoma is often a change in a mole or a new dark spot.  The ABCDE system is a way of remembering the signs of melanoma.  A for asymmetry:  The two halves do not match. B for border:  The edges of the growth are irregular. C for color:  A mixture of colors are present instead of an even brown color. D for diameter:  Melanomas are usually (but not always) greater than 6mm - the size of a pencil eraser. E for evolution:  The spot keeps changing in size, shape, and color.  Please check your skin once per month between visits. You can use a small mirror in front and a large mirror behind you to keep an eye on the back side or your body.   If you see any new or changing lesions before your next follow-up, please call to schedule a visit.  Please continue daily skin protection including broad spectrum sunscreen SPF 30+ to sun-exposed areas, reapplying every 2 hours as needed when you're outdoors.   Staying in the shade or wearing long sleeves, sun glasses (UVA+UVB protection) and wide brim hats (4-inch brim around the entire circumference of the hat) are also recommended for sun protection.     Due to recent changes in healthcare laws, you may see results of your pathology and/or laboratory studies on MyChart before the doctors have had a chance  to review them. We understand that in some cases there may be results that are confusing or concerning to you. Please understand that not all results are received at the same time and often the doctors may need to interpret multiple results in order to provide you with the best plan of care or course of treatment. Therefore, we ask that you please give us  2 business days to thoroughly review all your results before contacting the office for clarification. Should we see a critical lab result, you will be contacted sooner.   If You Need Anything  After Your Visit  If you have any questions or concerns for your doctor, please call our main line at [phone] and press option 4 to reach your doctor's medical assistant. If no one answers, please leave a voicemail as directed and we will return your call as soon as possible. Messages left after 4 pm will be answered the following business day.   You may also send us  a message via MyChart. We typically respond to MyChart messages within 1-2 business days.  For prescription refills, please ask your pharmacy to contact our office. Our fax number is [phone].  If you have an urgent issue when the clinic is closed that cannot wait until the next business day, you can page your doctor at the number below.    Please note that while we do our best to be available for urgent issues outside of office hours, we are not available 24/7.   If you have an urgent issue and are unable to reach us , you may choose to seek medical care at your doctor's office, retail clinic, urgent care center, or emergency room.  If you have a medical emergency, please immediately call 911 or go to the emergency department.  Pager Numbers  - Dr. Hester: [phone]  - Dr. Jackquline: [phone]  - Dr. Claudene: [phone]   In the event of inclement weather, please call our main line at [phone] for an update on the status of any delays or closures.  Dermatology Medication Tips: Please keep the boxes that topical medications come in in order to help keep track of the instructions about where and how to use these. Pharmacies typically print the medication instructions only on the boxes and not directly on the medication tubes.   If your medication is too expensive, please contact our office at [phone] option 4 or send us  a message through MyChart.   We are unable to tell what your co-pay for medications will be in advance as this is different depending on your insurance coverage. However, we may be  able to find a substitute medication at lower cost or fill out paperwork to get insurance to cover a needed medication.   If a prior authorization is required to get your medication covered by your insurance company, please allow us  1-2 business days to complete this process.  Drug prices often vary depending on where the prescription is filled and some pharmacies may offer cheaper prices.  The website www.goodrx.com contains coupons for medications through different pharmacies. The prices here do not account for what the cost may be with help from insurance (it may be cheaper with your insurance), but the website can give you the price if you did not use any insurance.  - You can print the associated coupon and take it with your prescription to the pharmacy.  - You may also stop by our office during regular business hours and pick up a GoodRx coupon  card.  - If you need your prescription sent electronically to a different pharmacy, notify our office through [facility] or by phone at [phone] option 4.     Si Usted Necesita Algo Despus de Su Visita  Tambin puede enviarnos un mensaje a travs de Clinical Cytogeneticist. Por lo general respondemos a los mensajes de MyChart en el transcurso de 1 a 2 das hbiles.  Para renovar recetas, por favor pida a su farmacia que se ponga en contacto con nuestra oficina. Randi lakes de fax es [place] [phone].  Si tiene un asunto urgente cuando la clnica est cerrada y que no puede esperar hasta el siguiente da hbil, puede llamar/localizar a su doctor(a) al nmero que aparece a continuacin.   Por favor, tenga en cuenta que aunque hacemos todo lo posible para estar disponibles para asuntos urgentes fuera del horario de [place], no estamos disponibles las 24 horas del da, los 7 [address]  [address] de la [place].   Si tiene un problema urgente y no puede comunicarse con nosotros, puede optar por buscar atencin mdica  en el consultorio de su doctor(a), en una clnica  privada, en un centro de atencin urgente o en una sala de emergencias.  Si tiene engineer, drilling, por favor llame inmediatamente al 911 o vaya a la sala de emergencias.  Nmeros de bper  - Dr. Hester: [phone]  - Dra. Jackquline: [phone]  - Dr. Claudene: [phone]   En caso de inclemencias del tiempo, por favor llame a landry capes principal al [phone] para una actualizacin sobre el [place] de cualquier retraso o cierre.  Consejos para la medicacin en dermatologa: Por favor, guarde las cajas en las que vienen los medicamentos de uso tpico para ayudarle a seguir las instrucciones sobre dnde y cmo usarlos. Las farmacias generalmente imprimen las instrucciones del medicamento slo en las cajas y no directamente en los tubos del [place].   Si su medicamento es muy caro, por favor, pngase en contacto con landry rieger llamando al [phone] y presione la opcin 4 o envenos un mensaje a travs de Clinical Cytogeneticist.   No podemos decirle cul ser su copago por los medicamentos por adelantado ya que esto es diferente dependiendo de la cobertura de su seguro. Sin embargo, es posible que podamos encontrar un medicamento sustituto a audiological scientist un formulario para que el seguro cubra el medicamento que se considera necesario.   Si se requiere una autorizacin previa para que su compaa de seguros cubra su medicamento, por favor permtanos de 1 a 2 das hbiles para completar este proceso.  Los precios de los medicamentos varan con frecuencia dependiendo del environmental consultant de dnde se surte la receta y alguna farmacias pueden ofrecer precios ms baratos.  El sitio web www.goodrx.com tiene cupones para medicamentos de health and safety inspector. Los precios aqu no tienen en cuenta lo que podra costar con la ayuda del seguro (puede ser ms barato con su seguro), pero el sitio web puede darle el precio si no utiliz tourist information centre manager.  - Puede imprimir el cupn correspondiente y  llevarlo con su receta a la farmacia.  - Tambin puede pasar por nuestra oficina durante el horario de atencin regular y education officer, museum una tarjeta de cupones de GoodRx.  - Si necesita que su receta se enve electrnicamente a una farmacia diferente, informe a nuestra oficina a travs de MyChart de [place] o por telfono llamando al [phone] y presione la opcin 4.

## 2024-04-10 NOTE — Progress Notes (Signed)
 Subjective   Kevin Webb is a 78 y.o. male who presents for the following: Total body skin exam for skin cancer screening and mole check. The patient has spots, moles and lesions to be evaluated, some may be new or changing and the patient may have concern these could be cancer. Patient is new patient  Today patient reports: Patient has psoriasis and would like treatment, has a back mole would like a recheck, has LOC on right shoulder.   Review of Systems:    No other skin or systemic complaints except as noted in HPI or Assessment and Plan.  The following portions of the chart were reviewed this encounter and updated as appropriate: medications, allergies, medical history  Relevant Medical History:  Family history of skin cancer - Brother (melanoma)  and Dysplastic Nevi  Objective  (SKPE) Well appearing patient in no apparent distress; mood and affect are within normal limits. Examination was performed of the: Full Skin Examination: scalp, head, eyes, ears, nose, lips, neck, chest, axillae, abdomen, back, buttocks, bilateral upper extremities, bilateral lower extremities, hands, feet, fingers, toes, fingernails, and toenails.   Examination notable for: SKIN EXAM, Angioma(s): Scattered red vascular papule(s)  , Lentigo/lentigines: Scattered pigmented macules that are tan to brown in color and are somewhat non-uniform in shape and concentrated in the sun-exposed areas, Nevus/nevi: Scattered well-demarcated, regular, pigmented macule(s) and/or papule(s)  , Seborrheic Keratosis(es): Stuck-on appearing keratotic papule(s) on the trunk, some  irritated with redness, crusting, edema, and/or partial avulsion, Actinic Damage/Elastosis: chronic sun damage: dyspigmentation, telangiectasia, and wrinkling. - Well circumscribed erythematous papules and plaques with overlying silvery scale on flexural surfaces including knees  Examination limited by: Undergarments     Assessment & Plan  (SKAP)    SKIN CANCER SCREENING PERFORMED TODAY.  BENIGN SKIN FINDINGS  - Lentigines  - Seborrheic keratoses  - Hemangiomas   - Nevus/Multiple Benign Nevi  - Reassurance provided regarding the benign appearance of lesions noted on exam today; no treatment is indicated in the absence of symptoms/changes. - Reinforced importance of photoprotective strategies including liberal and frequent sunscreen use of a broad-spectrum SPF 30 or greater, use of protective clothing, and sun avoidance for prevention of cutaneous malignancy and photoaging.  Counseled patient on the importance of regular self-skin monitoring as well as routine clinical skin examinations as scheduled.   ACTINIC DAMAGE - Chronic condition, secondary to cumulative UV/sun exposure - Recommend daily broad spectrum sunscreen SPF 30+ to sun-exposed areas, reapply every 2 hours as needed.  - Staying in the shade or wearing long sleeves, sun glasses (UVA+UVB protection) and wide brim hats (4-inch brim around the entire circumference of the hat) are also recommended for sun protection.  - Call for new or changing lesions.  Personal history of family history of melanoma and dysplastic nevi  - Reviewed medical history for full details  - Reviewed sun protective measures as above - Encouraged full body skin exams     Psoriasis - mild, BSA 1% Chronic and persistent condition with duration or expected duration over one year. Condition is symptomatic and bothersome to patient. Patient is flaring and not currently at treatment goal.  - Educated, discussed chronic nature and waxing/waning. - Discussed association with psoriatic arthritis, monitor for increasing joint pain/stiffness, red/hot swollen fingers or joints; discussed if develops joint involvement will need referral to Rheumatology and possible escalation of therapy - Discussed different treatments including topical corticosteroids, and/or topical vitamin D  vs nbUVB vs systemic therapy (CsA,  MTX, etanercept,  adalimumab, ustekinumab, ixekizumab, apremilast) - Start clobetasol  ointment 0.05% twice daily for 2 weeks to thick plaques until area smooth. Can similarly retreat for flares. Discussed side effect of super potent topical steroids including atrophy, dyspigmentation, striae, telangectasia, folliculitis, loss of skin pigment, hair growth, tachyphylaxis, risk of systemic absorption with missuse. - Recommend referral to see rheumatology given joint pain. Will place referral.   Level of service outlined above   Patient instructions (SKPI)   Procedures, orders, diagnosis for this visit:  INFLAMED SEBORRHEIC KERATOSIS (3) R shoulder/central back/right neck x3 (3) Destruction of lesion - R shoulder/central back/right neck x3 (3) Complexity: simple   Destruction method: cryotherapy   Informed consent: discussed and consent obtained   Timeout:  patient name, date of birth, surgical site, and procedure verified Lesion destroyed using liquid nitrogen: Yes   Region frozen until ice ball extended beyond lesion: Yes   Cryo cycles: 1 or 2. Outcome: patient tolerated procedure well with no complications   Post-procedure details: wound care instructions given   Additional details:  Prior to procedure, discussed risks of blister formation, small wound, skin dyspigmentation, or rare scar following cryotherapy. Recommend Vaseline ointment to treated areas while healing.   PSORIASIS   Related Procedures Ambulatory referral to Rheumatology LENTIGO   SEBORRHEIC KERATOSIS   MULTIPLE BENIGN NEVI   CHERRY ANGIOMA   ACTINIC SKIN DAMAGE    Psoriasis -     Ambulatory referral to Rheumatology  Inflamed seborrheic keratosis -     Destruction of lesion  Lentigo  Seborrheic keratosis  Multiple benign nevi  Cherry angioma  Actinic skin damage  Other orders -     Clobetasol  Propionate; Apply 1 gram topically to affected area of skin twice daily. Stop once resolved and  restart as needed for flares. Avoid use on face, armpits, groin unless otherwise indicated.  Dispense: 60 g; Refill: 5    Return to clinic: Return for 4-6 months psoriasis f/u, 1 year TBSE.  I, Almetta Nora, RMA, am acting as scribe for Lauraine JAYSON Kanaris, MD .   Documentation: I have reviewed the above documentation for accuracy and completeness, and I agree with the above.  Lauraine JAYSON Kanaris, MD

## 2024-05-25 ENCOUNTER — Ambulatory Visit

## 2024-05-25 VITALS — BP 120/80 | HR 50 | Temp 98.5°F | Ht 69.0 in | Wt 200.0 lb

## 2024-05-25 DIAGNOSIS — E1159 Type 2 diabetes mellitus with other circulatory complications: Secondary | ICD-10-CM

## 2024-05-25 DIAGNOSIS — R001 Bradycardia, unspecified: Secondary | ICD-10-CM

## 2024-05-25 DIAGNOSIS — I152 Hypertension secondary to endocrine disorders: Secondary | ICD-10-CM

## 2024-05-25 DIAGNOSIS — I6522 Occlusion and stenosis of left carotid artery: Secondary | ICD-10-CM

## 2024-05-25 DIAGNOSIS — E785 Hyperlipidemia, unspecified: Secondary | ICD-10-CM | POA: Diagnosis not present

## 2024-05-25 DIAGNOSIS — I25810 Atherosclerosis of coronary artery bypass graft(s) without angina pectoris: Secondary | ICD-10-CM | POA: Diagnosis not present

## 2024-05-25 DIAGNOSIS — E1169 Type 2 diabetes mellitus with other specified complication: Secondary | ICD-10-CM | POA: Diagnosis not present

## 2024-05-25 DIAGNOSIS — E119 Type 2 diabetes mellitus without complications: Secondary | ICD-10-CM

## 2024-05-25 DIAGNOSIS — Z8673 Personal history of transient ischemic attack (TIA), and cerebral infarction without residual deficits: Secondary | ICD-10-CM | POA: Diagnosis not present

## 2024-05-25 DIAGNOSIS — G2581 Restless legs syndrome: Secondary | ICD-10-CM

## 2024-05-25 LAB — HEMOGLOBIN A1C: Hgb A1c MFr Bld: 6.2 % (ref 4.6–6.5)

## 2024-05-25 LAB — MICROALBUMIN / CREATININE URINE RATIO
Creatinine,U: 16.9 mg/dL
Microalb Creat Ratio: UNDETERMINED mg/g (ref 0.0–30.0)
Microalb, Ur: <0.7 mg/dL

## 2024-05-25 LAB — COMPREHENSIVE METABOLIC PANEL WITH GFR
ALT: 11 U/L (ref 3–53)
AST: 19 U/L (ref 5–37)
Albumin: 4.3 g/dL (ref 3.5–5.2)
Alkaline Phosphatase: 50 U/L (ref 39–117)
BUN: 12 mg/dL (ref 6–23)
CO2: 30 meq/L (ref 19–32)
Calcium: 9 mg/dL (ref 8.4–10.5)
Chloride: 103 meq/L (ref 96–112)
Creatinine, Ser: 0.8 mg/dL (ref 0.40–1.50)
GFR: 86 mL/min
Glucose, Bld: 95 mg/dL (ref 70–99)
Potassium: 4.1 meq/L (ref 3.5–5.1)
Sodium: 139 meq/L (ref 135–145)
Total Bilirubin: 0.9 mg/dL (ref 0.2–1.2)
Total Protein: 6.6 g/dL (ref 6.0–8.3)

## 2024-05-25 MED ORDER — RAMIPRIL 10 MG PO CAPS: 10.0000 mg | ORAL_CAPSULE | Freq: Every day | ORAL | 3 refills | Status: AC

## 2024-05-25 MED ORDER — EZETIMIBE 10 MG PO TABS: 10.0000 mg | ORAL_TABLET | Freq: Every day | ORAL | 3 refills | Status: AC

## 2024-05-25 MED ORDER — ROSUVASTATIN CALCIUM 40 MG PO TABS: 40.0000 mg | ORAL_TABLET | Freq: Every day | ORAL | 3 refills | Status: AC

## 2024-05-25 MED ORDER — GABAPENTIN 300 MG PO CAPS: 300.0000 mg | ORAL_CAPSULE | Freq: Every day | ORAL | 3 refills | Status: AC

## 2024-05-25 MED ORDER — CLOPIDOGREL BISULFATE 75 MG PO TABS: 75.0000 mg | ORAL_TABLET | Freq: Every day | ORAL | 3 refills | Status: AC

## 2024-05-25 NOTE — Assessment & Plan Note (Addendum)
 BP on arrival above goal of <130/80 mmHg. Repeat BP within goal.  Home BP has been within goal as well. Continue: Amlodipine  5 mg, Ramipril  10 mg, Metoprolol  succinate 25 mg daily. Monitor blood pressure at home. Encouraged adherence to DASH diet. Orders:   ramipril  (ALTACE ) 10 MG capsule; Take 1 capsule (10 mg total) by mouth daily.

## 2024-05-25 NOTE — Assessment & Plan Note (Addendum)
 Patient asymptomatic. Continue f/u with vascular surgery.  S/P right TCAR on 08/17/2023 by Dr. Pearline.  Continue Plavix , aspirin  and crestor .  Orders:   clopidogrel  (PLAVIX ) 75 MG tablet; Take 1 tablet (75 mg total) by mouth daily.

## 2024-05-25 NOTE — Assessment & Plan Note (Deleted)
 Kevin Webb

## 2024-05-25 NOTE — Assessment & Plan Note (Addendum)
 A1c previously in diabetic range, managed with lifestyle modifications including diet and exercise. Repeat A1c, check urine microalbumin level, recommend annual diabetic eye exam. Recommend starting medication if A1c is 7% or higher. Continue diet modifications, exercise to help reduce risk of progression.  Orders:   ezetimibe  (ZETIA ) 10 MG tablet; Take 1 tablet (10 mg total) by mouth daily.   Comprehensive metabolic panel   Hemoglobin A1c   Urine Microalbumin w/creat. ratio   HgB A1c; Future

## 2024-05-25 NOTE — Assessment & Plan Note (Addendum)
 No chest pain, on Metoprolol  succinate 25 mg daily. Continue risk factor management including reducing alcohol consumption, BP control, cholesterol, blood glucose management. Continue follow up with cardiologist as scheduled. Continue Aspirin  81 mg, Plavix  75 mg daily.

## 2024-05-25 NOTE — Progress Notes (Signed)
 "  Established Patient Office Visit TOC from Dr. Hope      Subjective  Patient ID: Kevin Webb, male    DOB: February 28, 1946  Age: 79 y.o. MRN: [id number]  Chief Complaint  Patient presents with   Establish Care    Discussed the use of AI scribe software for clinical note transcription with the patient, who gave verbal consent to proceed.  History of Present Illness Kevin Webb Gene is a 79 year old male with a history of coronary artery disease, HTN, type II DM, diverticulosis, hyperlipidemia, GERD, psoriasis  who presents for transfer of care from previous PCP and medication management.  He has a history of carotid artery stenting on the right side and some narrowing on the left. He previously experienced dizziness, which led to a referral to ENT for vertigo evaluation, but he did not follow up as the dizziness has resolved. No current dizziness or weakness. He monitors his heart rate due to a history of bradycardia. He takes metoprolol  25 mg daily, amlodipine  5 mg daily, Ramipril  10 mg, aspirin  81 mg, Plavix  75 mg, Crestor  40 mg, zetia  10 mg.   He has a history of diverticulitis, GERD and uses ciprofloxacin  and Flagyl  as needed for flare-ups as recommended by GI.   He has a history of psoriasis and uses clobetasol  cream daily, sometimes twice a day. No joint pain currently.  He has a history of diabetes with previous A1c levels of 6.6% and 6.8%. He monitors his blood sugar daily, which typically ranges from 110 to 120 mg/dL. He manages his diabetes with diet and over-the-counter supplements like berberine and Vanadium.  He reports a history of alcohol use, consuming 2-4 beers per day and occasionally whiskey. He denies dependence but acknowledges the potential impact on his health, including erectile dysfunction and blood sugar control.  He has a history of stroke, with an episode where he experienced arm weakness and was later found to have had three strokes. He underwent an MRI  and was treated with a stent placement. He remains active, walking 3,500 to 10,000 steps daily and climbing stairs regularly.  He takes gabapentin  300 mg as needed for restless legs, and various supplements such as CoQ10, vitamin D , and fish oil.     ROS As per HPI    Objective:     BP 120/80   Pulse (!) 50   Temp 98.5 F (36.9 C) (Oral)   Ht 5' 9 (1.753 m)   Wt 200 lb (90.7 kg)   SpO2 96%   BMI 29.53 kg/m      05/25/2024   10:12 AM 12/21/2023   10:39 AM 08/23/2023   11:17 AM  Depression screen PHQ 2/9  Decreased Interest 0 0 0  Down, Depressed, Hopeless 0 0 0  PHQ - 2 Score 0 0 0  Altered sleeping 0 1 2  Tired, decreased energy 0 0 0  Change in appetite 0 0 0  Feeling bad or failure about yourself  0 0 0  Trouble concentrating 0 0 0  Moving slowly or fidgety/restless 0 0 0  Suicidal thoughts 0 0 0  PHQ-9 Score 0 1  2   Difficult doing work/chores Not difficult at all Not difficult at all Not difficult at all     Data saved with a previous flowsheet row definition      05/25/2024   10:13 AM 08/23/2023   11:17 AM 05/06/2023   11:35 AM 11/03/2022   10:34 AM  GAD  7 : Generalized Anxiety Score  Nervous, Anxious, on Edge 0 0 0 0  Control/stop worrying 0 0 0 0  Worry too much - different things 0 0 0 0  Trouble relaxing 0 0 0 0  Restless 0 0 0 0  Easily annoyed or irritable 0 0 0 0  Afraid - awful might happen 0 0 0 0  Total GAD 7 Score 0 0 0 0  Anxiety Difficulty Not difficult at all Not difficult at all Not difficult at all Not difficult at all      05/25/2024   10:12 AM 12/21/2023   10:39 AM 08/23/2023   11:17 AM  Depression screen PHQ 2/9  Decreased Interest 0 0 0  Down, Depressed, Hopeless 0 0 0  PHQ - 2 Score 0 0 0  Altered sleeping 0 1 2  Tired, decreased energy 0 0 0  Change in appetite 0 0 0  Feeling bad or failure about yourself  0 0 0  Trouble concentrating 0 0 0  Moving slowly or fidgety/restless 0 0 0  Suicidal thoughts 0 0 0  PHQ-9 Score 0 1  2    Difficult doing work/chores Not difficult at all Not difficult at all Not difficult at all     Data saved with a previous flowsheet row definition      05/25/2024   10:13 AM 08/23/2023   11:17 AM 05/06/2023   11:35 AM 11/03/2022   10:34 AM  GAD 7 : Generalized Anxiety Score  Nervous, Anxious, on Edge 0 0 0 0  Control/stop worrying 0 0 0 0  Worry too much - different things 0 0 0 0  Trouble relaxing 0 0 0 0  Restless 0 0 0 0  Easily annoyed or irritable 0 0 0 0  Afraid - awful might happen 0 0 0 0  Total GAD 7 Score 0 0 0 0  Anxiety Difficulty Not difficult at all Not difficult at all Not difficult at all Not difficult at all   SDOH Screenings   Food Insecurity: No Food Insecurity (12/21/2023)  Housing: Unknown (12/21/2023)  Transportation Needs: No Transportation Needs (12/21/2023)  Utilities: Not At Risk (12/21/2023)  Alcohol Screen: Low Risk (12/21/2023)  Depression (PHQ2-9): Low Risk (05/25/2024)  Financial Resource Strain: Low Risk (12/21/2023)  Physical Activity: Inactive (12/21/2023)  Social Connections: Moderately Integrated (12/21/2023)  Stress: No Stress Concern Present (12/21/2023)  Tobacco Use: Low Risk (05/25/2024)  Health Literacy: Adequate Health Literacy (12/21/2023)     Physical Exam Constitutional:      General: He is not in acute distress.    Appearance: Normal appearance.  HENT:     Head: Normocephalic and atraumatic.     Mouth/Throat:     Mouth: Mucous membranes are moist.  Neck:     Thyroid : No thyroid  mass or thyroid  tenderness.  Cardiovascular:     Rate and Rhythm: Regular rhythm. Bradycardia present.  Pulmonary:     Effort: Pulmonary effort is normal.     Breath sounds: Normal breath sounds. No wheezing.  Abdominal:     General: Bowel sounds are normal.     Palpations: Abdomen is soft.     Tenderness: There is no guarding.  Musculoskeletal:     Cervical back: Neck supple. No rigidity.     Right lower leg: No edema.     Left lower leg: No edema.  Skin:     General: Skin is warm.  Neurological:     Mental Status: He is alert  and oriented to person, place, and time.  Psychiatric:        Mood and Affect: Mood normal.        Behavior: Behavior normal.        No results found for any visits on 05/25/24.  The ASCVD Risk score (Arnett DK, et al., 2019) failed to calculate for the following reasons:   Risk score cannot be calculated because patient has a medical history suggesting prior/existing ASCVD   * - Cholesterol units were assumed     Assessment & Plan:  Patient is a pleasant 79 year old male presenting for transfer of care from previous PCP and chronic medication management. Recommend f/u with GI for GERD and dermatologist for psoriasis.  Assessment & Plan Hyperlipidemia associated with type 2 diabetes mellitus (HCC) Continue Crestor  40 mg and zetia  10 mg daily. Goal LDL <70. Repeat fasting lipid panel in 6 months. Future lab ordered.  Orders:   ezetimibe  (ZETIA ) 10 MG tablet; Take 1 tablet (10 mg total) by mouth daily.   rosuvastatin  (CRESTOR ) 40 MG tablet; Take 1 tablet (40 mg total) by mouth at bedtime.   Lipid panel; Future  Type 2 diabetes mellitus without complication, without long-term current use of insulin  (HCC) A1c previously in diabetic range, managed with lifestyle modifications including diet and exercise. Repeat A1c, check urine microalbumin level, recommend annual diabetic eye exam. Recommend starting medication if A1c is 7% or higher. Continue diet modifications, exercise to help reduce risk of progression.  Orders:   ezetimibe  (ZETIA ) 10 MG tablet; Take 1 tablet (10 mg total) by mouth daily.   Comprehensive metabolic panel   Hemoglobin A1c   Urine Microalbumin w/creat. ratio   HgB A1c; Future  Hypertension associated with diabetes (HCC) BP on arrival above goal of <130/80 mmHg. Repeat BP within goal.  Home BP has been within goal as well. Continue: Amlodipine  5 mg, Ramipril  10 mg, Metoprolol  succinate 25 mg  daily. Monitor blood pressure at home. Encouraged adherence to DASH diet. Orders:   ramipril  (ALTACE ) 10 MG capsule; Take 1 capsule (10 mg total) by mouth daily.  RLS (restless legs syndrome) Stable on PRN Gabapentin  300 mg at night time. PDMP reviewed, continue.  Orders:   gabapentin  (NEURONTIN ) 300 MG capsule; Take 1 capsule (300 mg total) by mouth at bedtime.  Coronary artery disease involving coronary bypass graft of native heart without angina pectoris No chest pain, on Metoprolol  succinate 25 mg daily. Continue risk factor management including reducing alcohol consumption, BP control, cholesterol, blood glucose management. Continue follow up with cardiologist as scheduled. Continue Aspirin  81 mg, Plavix  75 mg daily.     Sinus bradycardia Patient asymptomatic. Monitor and report if symptoms like dizziness, palpitations, weakness, presyncope like episode occurs. I also updated his cardiologist on current HR.     Left carotid artery stenosis Patient asymptomatic. Continue f/u with vascular surgery.  S/P right TCAR on 08/17/2023 by Dr. Pearline.  Continue Plavix , aspirin  and crestor .  Orders:   clopidogrel  (PLAVIX ) 75 MG tablet; Take 1 tablet (75 mg total) by mouth daily.  History of CVA (cerebrovascular accident) without residual deficits Plan per left CAD.  Orders:   clopidogrel  (PLAVIX ) 75 MG tablet; Take 1 tablet (75 mg total) by mouth daily.  I personally spent a total of 45 minutes in the care of the patient today including preparing to see the patient, getting/reviewing separately obtained history, performing a medically appropriate exam/evaluation, counseling and educating, placing orders, documenting clinical information in the EHR, independently  interpreting results, communicating results, and coordinating care.   Return in about 6 months (around 11/22/2024) for chronic follow up, fasting labs 2 days before apt.   Luke Shade, MD "

## 2024-05-25 NOTE — Assessment & Plan Note (Addendum)
 Stable on PRN Gabapentin  300 mg at night time. PDMP reviewed, continue.  Orders:   gabapentin  (NEURONTIN ) 300 MG capsule; Take 1 capsule (300 mg total) by mouth at bedtime.

## 2024-05-25 NOTE — Assessment & Plan Note (Addendum)
 Patient asymptomatic. Monitor and report if symptoms like dizziness, palpitations, weakness, presyncope like episode occurs. I also updated his cardiologist on current HR.

## 2024-05-25 NOTE — Patient Instructions (Addendum)
 If your blood glucose shows A1c more than 7% we may need to start sugar lowering medication.  Goal BP is less than 130/80 mmHg. Continue current medication. Continue home BP check and let us  know if BP is over 130/80 mmHg consistently.  No amount of alcohol is good for your health.  Follow up with Dr. Abbey in 6 months, recommend labs couple of days before your appointment.

## 2024-05-25 NOTE — Assessment & Plan Note (Deleted)
 SABRA

## 2024-05-25 NOTE — Assessment & Plan Note (Addendum)
 Continue Crestor  40 mg and zetia  10 mg daily. Goal LDL <70. Repeat fasting lipid panel in 6 months. Future lab ordered.  Orders:   ezetimibe  (ZETIA ) 10 MG tablet; Take 1 tablet (10 mg total) by mouth daily.   rosuvastatin  (CRESTOR ) 40 MG tablet; Take 1 tablet (40 mg total) by mouth at bedtime.   Lipid panel; Future

## 2024-05-25 NOTE — Assessment & Plan Note (Addendum)
 Plan per left CAD.  Orders:   clopidogrel  (PLAVIX ) 75 MG tablet; Take 1 tablet (75 mg total) by mouth daily.

## 2024-05-26 ENCOUNTER — Ambulatory Visit: Payer: Self-pay

## 2024-08-14 ENCOUNTER — Ambulatory Visit

## 2024-11-22 ENCOUNTER — Other Ambulatory Visit

## 2024-11-24 ENCOUNTER — Ambulatory Visit

## 2024-12-22 ENCOUNTER — Ambulatory Visit

## 2025-01-18 ENCOUNTER — Ambulatory Visit: Admitting: Urology

## 2025-04-16 ENCOUNTER — Ambulatory Visit
